# Patient Record
Sex: Female | Born: 1945 | Race: Black or African American | Hispanic: No | Marital: Single | Smoking: Never smoker
Health system: Southern US, Community
[De-identification: ages and names within clinical notes are randomized; demographics above are authoritative.]

## PROBLEM LIST (undated history)

## (undated) DIAGNOSIS — H40003 Preglaucoma, unspecified, bilateral: Secondary | ICD-10-CM

## (undated) DIAGNOSIS — E669 Obesity, unspecified: Secondary | ICD-10-CM

## (undated) DIAGNOSIS — Z6841 Body Mass Index (BMI) 40.0 and over, adult: Secondary | ICD-10-CM

## (undated) DIAGNOSIS — N189 Chronic kidney disease, unspecified: Secondary | ICD-10-CM

## (undated) DIAGNOSIS — I10 Essential (primary) hypertension: Secondary | ICD-10-CM

## (undated) DIAGNOSIS — M1990 Unspecified osteoarthritis, unspecified site: Secondary | ICD-10-CM

## (undated) DIAGNOSIS — I2510 Atherosclerotic heart disease of native coronary artery without angina pectoris: Secondary | ICD-10-CM

## (undated) DIAGNOSIS — K222 Esophageal obstruction: Secondary | ICD-10-CM

## (undated) DIAGNOSIS — Z9842 Cataract extraction status, left eye: Secondary | ICD-10-CM

## (undated) DIAGNOSIS — H409 Unspecified glaucoma: Secondary | ICD-10-CM

## (undated) DIAGNOSIS — D649 Anemia, unspecified: Secondary | ICD-10-CM

## (undated) DIAGNOSIS — E66812 Obesity, class 2: Secondary | ICD-10-CM

## (undated) DIAGNOSIS — F329 Major depressive disorder, single episode, unspecified: Secondary | ICD-10-CM

## (undated) DIAGNOSIS — E785 Hyperlipidemia, unspecified: Secondary | ICD-10-CM

## (undated) DIAGNOSIS — G894 Chronic pain syndrome: Secondary | ICD-10-CM

## (undated) DIAGNOSIS — Z9841 Cataract extraction status, right eye: Secondary | ICD-10-CM

## (undated) DIAGNOSIS — I739 Peripheral vascular disease, unspecified: Secondary | ICD-10-CM

## (undated) DIAGNOSIS — K219 Gastro-esophageal reflux disease without esophagitis: Secondary | ICD-10-CM

## (undated) DIAGNOSIS — H269 Unspecified cataract: Secondary | ICD-10-CM

## (undated) DIAGNOSIS — E1142 Type 2 diabetes mellitus with diabetic polyneuropathy: Secondary | ICD-10-CM

## (undated) DIAGNOSIS — K8020 Calculus of gallbladder without cholecystitis without obstruction: Secondary | ICD-10-CM

## (undated) DIAGNOSIS — D638 Anemia in other chronic diseases classified elsewhere: Secondary | ICD-10-CM

## (undated) DIAGNOSIS — Z5189 Encounter for other specified aftercare: Secondary | ICD-10-CM

## (undated) DIAGNOSIS — E119 Type 2 diabetes mellitus without complications: Principal | ICD-10-CM

## (undated) HISTORY — DX: Body Mass Index (BMI) 40.0 and over, adult: Z684

## (undated) HISTORY — DX: Unspecified glaucoma: H40.9

## (undated) HISTORY — DX: Esophageal obstruction: K22.2

## (undated) HISTORY — PX: ESOPHAGEAL DILATION: SHX303

## (undated) HISTORY — DX: Unspecified cataract: H26.9

## (undated) HISTORY — DX: Cataract extraction status, right eye: Z98.42

## (undated) HISTORY — DX: Morbid (severe) obesity due to excess calories: E66.01

## (undated) HISTORY — DX: Type 2 diabetes mellitus without complications: E11.9

## (undated) HISTORY — DX: Chronic pain syndrome: G89.4

## (undated) HISTORY — DX: Atherosclerotic heart disease of native coronary artery without angina pectoris: I25.10

## (undated) HISTORY — DX: Cataract extraction status, left eye: Z98.41

## (undated) HISTORY — DX: Encounter for other specified aftercare: Z51.89

## (undated) HISTORY — DX: Obesity, class 2: E66.812

## (undated) HISTORY — PX: CATARACT EXTRACTION, BILATERAL: SHX1313

## (undated) HISTORY — DX: Preglaucoma, unspecified, bilateral: H40.003

## (undated) HISTORY — DX: Calculus of gallbladder without cholecystitis without obstruction: K80.20

## (undated) HISTORY — DX: Peripheral vascular disease, unspecified: I73.9

## (undated) HISTORY — DX: Obesity, unspecified: E66.9

## (undated) HISTORY — DX: Chronic kidney disease, unspecified: N18.9

## (undated) HISTORY — PX: POLYPECTOMY: SHX149

## (undated) HISTORY — DX: Anemia, unspecified: D64.9

## (undated) HISTORY — DX: Hyperlipidemia, unspecified: E78.5

## (undated) HISTORY — PX: COLONOSCOPY: SHX174

## (undated) HISTORY — DX: Major depressive disorder, single episode, unspecified: F32.9

## (undated) HISTORY — PX: CHOLECYSTECTOMY: SHX55

## (undated) HISTORY — PX: LEFT HEART CATH: SHX5946

## (undated) HISTORY — DX: Essential (primary) hypertension: I10

## (undated) HISTORY — DX: Type 2 diabetes mellitus with diabetic polyneuropathy: E11.42

## (undated) HISTORY — DX: Anemia in other chronic diseases classified elsewhere: D63.8

## (undated) HISTORY — DX: Gastro-esophageal reflux disease without esophagitis: K21.9

## (undated) HISTORY — DX: Unspecified osteoarthritis, unspecified site: M19.90

---

## 1998-03-11 ENCOUNTER — Encounter: Admission: RE | Admit: 1998-03-11 | Discharge: 1998-03-11 | Payer: Self-pay | Admitting: Internal Medicine

## 1998-05-25 ENCOUNTER — Emergency Department (HOSPITAL_COMMUNITY): Admission: EM | Admit: 1998-05-25 | Discharge: 1998-05-25 | Payer: Self-pay | Admitting: Emergency Medicine

## 1998-07-09 ENCOUNTER — Encounter: Admission: RE | Admit: 1998-07-09 | Discharge: 1998-07-09 | Payer: Self-pay | Admitting: Hematology and Oncology

## 1998-09-17 ENCOUNTER — Ambulatory Visit (HOSPITAL_COMMUNITY): Admission: RE | Admit: 1998-09-17 | Discharge: 1998-09-17 | Payer: Self-pay | Admitting: Family Medicine

## 1998-10-02 ENCOUNTER — Encounter: Admission: RE | Admit: 1998-10-02 | Discharge: 1998-10-02 | Payer: Self-pay | Admitting: Hematology and Oncology

## 1998-10-31 ENCOUNTER — Encounter: Admission: RE | Admit: 1998-10-31 | Discharge: 1998-10-31 | Payer: Self-pay | Admitting: Internal Medicine

## 1998-10-31 ENCOUNTER — Emergency Department (HOSPITAL_COMMUNITY): Admission: EM | Admit: 1998-10-31 | Discharge: 1998-10-31 | Payer: Self-pay | Admitting: Emergency Medicine

## 1998-12-03 ENCOUNTER — Ambulatory Visit (HOSPITAL_BASED_OUTPATIENT_CLINIC_OR_DEPARTMENT_OTHER): Admission: RE | Admit: 1998-12-03 | Discharge: 1998-12-03 | Payer: Self-pay | Admitting: Orthopedic Surgery

## 1998-12-11 ENCOUNTER — Encounter: Admission: RE | Admit: 1998-12-11 | Discharge: 1999-02-20 | Payer: Self-pay | Admitting: Internal Medicine

## 1999-03-20 ENCOUNTER — Ambulatory Visit (HOSPITAL_COMMUNITY): Admission: RE | Admit: 1999-03-20 | Discharge: 1999-03-20 | Payer: Self-pay | Admitting: Family Medicine

## 1999-04-17 ENCOUNTER — Encounter: Admission: RE | Admit: 1999-04-17 | Discharge: 1999-04-17 | Payer: Self-pay | Admitting: Internal Medicine

## 1999-04-21 ENCOUNTER — Encounter: Admission: RE | Admit: 1999-04-21 | Discharge: 1999-04-21 | Payer: Self-pay | Admitting: Internal Medicine

## 1999-07-14 ENCOUNTER — Encounter: Admission: RE | Admit: 1999-07-14 | Discharge: 1999-07-14 | Payer: Self-pay | Admitting: Internal Medicine

## 1999-07-14 ENCOUNTER — Other Ambulatory Visit: Admission: RE | Admit: 1999-07-14 | Discharge: 1999-07-14 | Payer: Self-pay | Admitting: *Deleted

## 1999-08-05 ENCOUNTER — Encounter: Admission: RE | Admit: 1999-08-05 | Discharge: 1999-08-05 | Payer: Self-pay | Admitting: Hematology and Oncology

## 1999-09-08 ENCOUNTER — Encounter: Admission: RE | Admit: 1999-09-08 | Discharge: 1999-09-08 | Payer: Self-pay | Admitting: Internal Medicine

## 1999-09-08 ENCOUNTER — Other Ambulatory Visit: Admission: RE | Admit: 1999-09-08 | Discharge: 1999-09-30 | Payer: Self-pay | Admitting: *Deleted

## 1999-10-30 ENCOUNTER — Encounter: Admission: RE | Admit: 1999-10-30 | Discharge: 1999-10-30 | Payer: Self-pay | Admitting: Hematology and Oncology

## 1999-11-24 ENCOUNTER — Encounter: Admission: RE | Admit: 1999-11-24 | Discharge: 1999-11-24 | Payer: Self-pay | Admitting: Internal Medicine

## 2000-03-22 ENCOUNTER — Ambulatory Visit (HOSPITAL_COMMUNITY): Admission: RE | Admit: 2000-03-22 | Discharge: 2000-03-22 | Payer: Self-pay | Admitting: Family Medicine

## 2000-03-22 ENCOUNTER — Encounter: Payer: Self-pay | Admitting: Family Medicine

## 2000-03-26 ENCOUNTER — Encounter: Admission: RE | Admit: 2000-03-26 | Discharge: 2000-03-26 | Payer: Self-pay | Admitting: Family Medicine

## 2000-03-26 ENCOUNTER — Encounter: Payer: Self-pay | Admitting: Family Medicine

## 2000-04-15 ENCOUNTER — Encounter: Admission: RE | Admit: 2000-04-15 | Discharge: 2000-04-15 | Payer: Self-pay | Admitting: Hematology and Oncology

## 2000-08-09 ENCOUNTER — Encounter: Admission: RE | Admit: 2000-08-09 | Discharge: 2000-08-09 | Payer: Self-pay | Admitting: Internal Medicine

## 2000-08-09 ENCOUNTER — Ambulatory Visit (HOSPITAL_COMMUNITY): Admission: RE | Admit: 2000-08-09 | Discharge: 2000-08-09 | Payer: Self-pay

## 2000-09-27 ENCOUNTER — Encounter: Payer: Self-pay | Admitting: Family Medicine

## 2000-09-27 ENCOUNTER — Encounter: Admission: RE | Admit: 2000-09-27 | Discharge: 2000-09-27 | Payer: Self-pay | Admitting: Family Medicine

## 2000-11-29 ENCOUNTER — Encounter: Admission: RE | Admit: 2000-11-29 | Discharge: 2000-11-29 | Payer: Self-pay

## 2000-12-27 ENCOUNTER — Encounter: Admission: RE | Admit: 2000-12-27 | Discharge: 2000-12-27 | Payer: Self-pay | Admitting: Internal Medicine

## 2001-02-22 ENCOUNTER — Other Ambulatory Visit: Admission: RE | Admit: 2001-02-22 | Discharge: 2001-02-22 | Payer: Self-pay | Admitting: Obstetrics

## 2001-02-22 ENCOUNTER — Encounter: Admission: RE | Admit: 2001-02-22 | Discharge: 2001-02-22 | Payer: Self-pay | Admitting: Hematology and Oncology

## 2001-02-26 ENCOUNTER — Ambulatory Visit (HOSPITAL_COMMUNITY): Admission: RE | Admit: 2001-02-26 | Discharge: 2001-02-26 | Payer: Self-pay | Admitting: *Deleted

## 2001-03-29 ENCOUNTER — Ambulatory Visit (HOSPITAL_COMMUNITY): Admission: RE | Admit: 2001-03-29 | Discharge: 2001-03-29 | Payer: Self-pay | Admitting: Family Medicine

## 2001-03-29 ENCOUNTER — Encounter: Payer: Self-pay | Admitting: Family Medicine

## 2001-04-04 ENCOUNTER — Encounter: Admission: RE | Admit: 2001-04-04 | Discharge: 2001-04-04 | Payer: Self-pay | Admitting: Family Medicine

## 2001-04-04 ENCOUNTER — Encounter: Payer: Self-pay | Admitting: Family Medicine

## 2001-04-26 ENCOUNTER — Encounter: Admission: RE | Admit: 2001-04-26 | Discharge: 2001-04-26 | Payer: Self-pay | Admitting: Internal Medicine

## 2001-05-06 ENCOUNTER — Encounter (INDEPENDENT_AMBULATORY_CARE_PROVIDER_SITE_OTHER): Payer: Self-pay | Admitting: Specialist

## 2001-05-06 ENCOUNTER — Ambulatory Visit (HOSPITAL_BASED_OUTPATIENT_CLINIC_OR_DEPARTMENT_OTHER): Admission: RE | Admit: 2001-05-06 | Discharge: 2001-05-06 | Payer: Self-pay | Admitting: General Surgery

## 2001-06-22 ENCOUNTER — Ambulatory Visit (HOSPITAL_COMMUNITY): Admission: RE | Admit: 2001-06-22 | Discharge: 2001-06-22 | Payer: Self-pay | Admitting: *Deleted

## 2001-06-22 ENCOUNTER — Encounter: Admission: RE | Admit: 2001-06-22 | Discharge: 2001-06-22 | Payer: Self-pay | Admitting: *Deleted

## 2001-06-30 ENCOUNTER — Ambulatory Visit (HOSPITAL_COMMUNITY): Admission: RE | Admit: 2001-06-30 | Discharge: 2001-06-30 | Payer: Self-pay | Admitting: Infectious Diseases

## 2001-08-08 ENCOUNTER — Encounter: Admission: RE | Admit: 2001-08-08 | Discharge: 2001-08-08 | Payer: Self-pay

## 2001-09-06 ENCOUNTER — Encounter: Admission: RE | Admit: 2001-09-06 | Discharge: 2001-09-06 | Payer: Self-pay

## 2001-10-18 ENCOUNTER — Encounter: Admission: RE | Admit: 2001-10-18 | Discharge: 2001-10-18 | Payer: Self-pay

## 2002-02-21 ENCOUNTER — Encounter: Admission: RE | Admit: 2002-02-21 | Discharge: 2002-02-21 | Payer: Self-pay

## 2002-04-06 ENCOUNTER — Encounter: Admission: RE | Admit: 2002-04-06 | Discharge: 2002-04-06 | Payer: Self-pay | Admitting: Family Medicine

## 2002-04-06 ENCOUNTER — Encounter: Payer: Self-pay | Admitting: Family Medicine

## 2002-05-25 ENCOUNTER — Encounter: Admission: RE | Admit: 2002-05-25 | Discharge: 2002-05-25 | Payer: Self-pay | Admitting: Internal Medicine

## 2002-08-09 ENCOUNTER — Encounter: Admission: RE | Admit: 2002-08-09 | Discharge: 2002-08-09 | Payer: Self-pay | Admitting: Internal Medicine

## 2002-10-19 ENCOUNTER — Encounter: Admission: RE | Admit: 2002-10-19 | Discharge: 2002-10-19 | Payer: Self-pay | Admitting: Internal Medicine

## 2003-02-22 ENCOUNTER — Encounter: Admission: RE | Admit: 2003-02-22 | Discharge: 2003-02-22 | Payer: Self-pay | Admitting: Internal Medicine

## 2003-04-24 ENCOUNTER — Encounter: Payer: Self-pay | Admitting: Sports Medicine

## 2003-04-24 ENCOUNTER — Encounter: Admission: RE | Admit: 2003-04-24 | Discharge: 2003-04-24 | Payer: Self-pay | Admitting: Sports Medicine

## 2003-05-21 ENCOUNTER — Encounter: Admission: RE | Admit: 2003-05-21 | Discharge: 2003-05-21 | Payer: Self-pay | Admitting: Infectious Diseases

## 2003-05-24 ENCOUNTER — Encounter: Admission: RE | Admit: 2003-05-24 | Discharge: 2003-05-24 | Payer: Self-pay | Admitting: Internal Medicine

## 2003-07-13 ENCOUNTER — Encounter (INDEPENDENT_AMBULATORY_CARE_PROVIDER_SITE_OTHER): Payer: Self-pay

## 2003-07-13 ENCOUNTER — Other Ambulatory Visit: Admission: RE | Admit: 2003-07-13 | Discharge: 2003-07-13 | Payer: Self-pay | Admitting: *Deleted

## 2003-07-13 ENCOUNTER — Encounter: Admission: RE | Admit: 2003-07-13 | Discharge: 2003-07-13 | Payer: Self-pay | Admitting: Family Medicine

## 2003-07-13 ENCOUNTER — Encounter (INDEPENDENT_AMBULATORY_CARE_PROVIDER_SITE_OTHER): Payer: Self-pay | Admitting: Specialist

## 2003-07-23 ENCOUNTER — Encounter: Admission: RE | Admit: 2003-07-23 | Discharge: 2003-07-23 | Payer: Self-pay | Admitting: Internal Medicine

## 2003-07-25 ENCOUNTER — Encounter: Admission: RE | Admit: 2003-07-25 | Discharge: 2003-07-25 | Payer: Self-pay | Admitting: Internal Medicine

## 2003-07-25 ENCOUNTER — Encounter: Payer: Self-pay | Admitting: Internal Medicine

## 2003-07-25 ENCOUNTER — Ambulatory Visit (HOSPITAL_COMMUNITY): Admission: RE | Admit: 2003-07-25 | Discharge: 2003-07-25 | Payer: Self-pay | Admitting: Internal Medicine

## 2003-08-30 ENCOUNTER — Encounter: Admission: RE | Admit: 2003-08-30 | Discharge: 2003-08-30 | Payer: Self-pay | Admitting: Internal Medicine

## 2003-09-10 ENCOUNTER — Ambulatory Visit (HOSPITAL_COMMUNITY): Admission: RE | Admit: 2003-09-10 | Discharge: 2003-09-10 | Payer: Self-pay | Admitting: Internal Medicine

## 2003-11-01 ENCOUNTER — Encounter: Admission: RE | Admit: 2003-11-01 | Discharge: 2004-01-30 | Payer: Self-pay | Admitting: Orthopedic Surgery

## 2003-12-14 ENCOUNTER — Encounter: Admission: RE | Admit: 2003-12-14 | Discharge: 2003-12-14 | Payer: Self-pay | Admitting: Internal Medicine

## 2003-12-18 ENCOUNTER — Encounter: Admission: RE | Admit: 2003-12-18 | Discharge: 2003-12-18 | Payer: Self-pay | Admitting: Internal Medicine

## 2004-01-31 ENCOUNTER — Encounter: Admission: RE | Admit: 2004-01-31 | Discharge: 2004-03-18 | Payer: Self-pay | Admitting: Orthopedic Surgery

## 2004-04-23 ENCOUNTER — Encounter: Admission: RE | Admit: 2004-04-23 | Discharge: 2004-04-23 | Payer: Self-pay | Admitting: Internal Medicine

## 2004-04-28 ENCOUNTER — Ambulatory Visit (HOSPITAL_COMMUNITY): Admission: RE | Admit: 2004-04-28 | Discharge: 2004-04-28 | Payer: Self-pay | Admitting: Internal Medicine

## 2004-05-01 ENCOUNTER — Encounter: Admission: RE | Admit: 2004-05-01 | Discharge: 2004-05-01 | Payer: Self-pay | Admitting: Internal Medicine

## 2004-05-07 ENCOUNTER — Ambulatory Visit (HOSPITAL_COMMUNITY): Admission: RE | Admit: 2004-05-07 | Discharge: 2004-05-08 | Payer: Self-pay | Admitting: Cardiology

## 2004-08-26 ENCOUNTER — Ambulatory Visit: Payer: Self-pay | Admitting: Internal Medicine

## 2004-08-27 ENCOUNTER — Ambulatory Visit: Payer: Self-pay | Admitting: Cardiology

## 2004-08-29 ENCOUNTER — Ambulatory Visit (HOSPITAL_COMMUNITY): Admission: RE | Admit: 2004-08-29 | Discharge: 2004-08-29 | Payer: Self-pay | Admitting: Internal Medicine

## 2004-09-01 ENCOUNTER — Ambulatory Visit: Payer: Self-pay | Admitting: Internal Medicine

## 2004-10-14 ENCOUNTER — Ambulatory Visit: Payer: Self-pay | Admitting: Internal Medicine

## 2005-01-14 ENCOUNTER — Other Ambulatory Visit: Admission: RE | Admit: 2005-01-14 | Discharge: 2005-01-14 | Payer: Self-pay | Admitting: Internal Medicine

## 2005-01-14 ENCOUNTER — Encounter (INDEPENDENT_AMBULATORY_CARE_PROVIDER_SITE_OTHER): Payer: Self-pay | Admitting: Internal Medicine

## 2005-01-14 ENCOUNTER — Encounter (INDEPENDENT_AMBULATORY_CARE_PROVIDER_SITE_OTHER): Payer: Self-pay | Admitting: *Deleted

## 2005-01-14 ENCOUNTER — Ambulatory Visit: Payer: Self-pay | Admitting: Internal Medicine

## 2005-02-16 ENCOUNTER — Ambulatory Visit: Payer: Self-pay | Admitting: Cardiology

## 2005-03-19 ENCOUNTER — Observation Stay (HOSPITAL_COMMUNITY): Admission: RE | Admit: 2005-03-19 | Discharge: 2005-03-20 | Payer: Self-pay | Admitting: Neurological Surgery

## 2005-05-08 ENCOUNTER — Encounter: Admission: RE | Admit: 2005-05-08 | Discharge: 2005-05-08 | Payer: Self-pay | Admitting: Internal Medicine

## 2005-05-14 ENCOUNTER — Encounter: Admission: RE | Admit: 2005-05-14 | Discharge: 2005-08-12 | Payer: Self-pay | Admitting: Neurological Surgery

## 2005-06-23 ENCOUNTER — Encounter: Admission: RE | Admit: 2005-06-23 | Discharge: 2005-06-23 | Payer: Self-pay | Admitting: Neurological Surgery

## 2005-07-16 ENCOUNTER — Ambulatory Visit: Payer: Self-pay | Admitting: Hospitalist

## 2005-08-13 ENCOUNTER — Ambulatory Visit (HOSPITAL_BASED_OUTPATIENT_CLINIC_OR_DEPARTMENT_OTHER): Admission: RE | Admit: 2005-08-13 | Discharge: 2005-08-13 | Payer: Self-pay | Admitting: Internal Medicine

## 2005-08-14 ENCOUNTER — Encounter: Admission: RE | Admit: 2005-08-14 | Discharge: 2005-08-14 | Payer: Self-pay | Admitting: Neurological Surgery

## 2005-08-16 ENCOUNTER — Ambulatory Visit: Payer: Self-pay | Admitting: Internal Medicine

## 2005-09-01 ENCOUNTER — Ambulatory Visit: Payer: Self-pay | Admitting: Cardiology

## 2005-10-14 ENCOUNTER — Inpatient Hospital Stay (HOSPITAL_COMMUNITY): Admission: RE | Admit: 2005-10-14 | Discharge: 2005-10-19 | Payer: Self-pay | Admitting: Neurological Surgery

## 2005-11-16 ENCOUNTER — Encounter: Admission: RE | Admit: 2005-11-16 | Discharge: 2005-12-09 | Payer: Self-pay | Admitting: Neurological Surgery

## 2006-01-05 ENCOUNTER — Encounter: Admission: RE | Admit: 2006-01-05 | Discharge: 2006-01-05 | Payer: Self-pay | Admitting: Anesthesiology

## 2006-01-13 ENCOUNTER — Ambulatory Visit: Payer: Self-pay | Admitting: Internal Medicine

## 2006-01-28 ENCOUNTER — Ambulatory Visit (HOSPITAL_COMMUNITY): Admission: RE | Admit: 2006-01-28 | Discharge: 2006-01-28 | Payer: Self-pay | Admitting: Internal Medicine

## 2006-02-03 ENCOUNTER — Encounter: Payer: Self-pay | Admitting: Cardiology

## 2006-02-03 ENCOUNTER — Ambulatory Visit (HOSPITAL_COMMUNITY): Admission: RE | Admit: 2006-02-03 | Discharge: 2006-02-03 | Payer: Self-pay | Admitting: Internal Medicine

## 2006-02-03 ENCOUNTER — Ambulatory Visit: Payer: Self-pay | Admitting: Cardiology

## 2006-02-09 ENCOUNTER — Ambulatory Visit: Payer: Self-pay | Admitting: Cardiology

## 2006-02-15 ENCOUNTER — Ambulatory Visit: Payer: Self-pay | Admitting: Internal Medicine

## 2006-04-02 ENCOUNTER — Encounter: Admission: RE | Admit: 2006-04-02 | Discharge: 2006-04-02 | Payer: Self-pay | Admitting: Neurological Surgery

## 2006-04-16 ENCOUNTER — Ambulatory Visit: Payer: Self-pay | Admitting: Internal Medicine

## 2006-05-11 ENCOUNTER — Encounter: Admission: RE | Admit: 2006-05-11 | Discharge: 2006-05-11 | Payer: Self-pay | Admitting: Internal Medicine

## 2006-06-08 ENCOUNTER — Ambulatory Visit: Payer: Self-pay | Admitting: Hospitalist

## 2006-07-29 ENCOUNTER — Ambulatory Visit: Payer: Self-pay | Admitting: Internal Medicine

## 2006-08-26 DIAGNOSIS — I2510 Atherosclerotic heart disease of native coronary artery without angina pectoris: Secondary | ICD-10-CM

## 2006-08-26 DIAGNOSIS — E1165 Type 2 diabetes mellitus with hyperglycemia: Secondary | ICD-10-CM | POA: Insufficient documentation

## 2006-08-26 DIAGNOSIS — E119 Type 2 diabetes mellitus without complications: Secondary | ICD-10-CM

## 2006-08-26 DIAGNOSIS — E118 Type 2 diabetes mellitus with unspecified complications: Secondary | ICD-10-CM

## 2006-08-26 DIAGNOSIS — E669 Obesity, unspecified: Secondary | ICD-10-CM | POA: Insufficient documentation

## 2006-08-26 DIAGNOSIS — IMO0002 Reserved for concepts with insufficient information to code with codable children: Secondary | ICD-10-CM | POA: Insufficient documentation

## 2006-08-26 DIAGNOSIS — E1151 Type 2 diabetes mellitus with diabetic peripheral angiopathy without gangrene: Secondary | ICD-10-CM | POA: Insufficient documentation

## 2006-08-26 DIAGNOSIS — F32A Depression, unspecified: Secondary | ICD-10-CM | POA: Insufficient documentation

## 2006-08-26 DIAGNOSIS — K219 Gastro-esophageal reflux disease without esophagitis: Secondary | ICD-10-CM | POA: Insufficient documentation

## 2006-08-26 DIAGNOSIS — I10 Essential (primary) hypertension: Secondary | ICD-10-CM

## 2006-08-26 DIAGNOSIS — I152 Hypertension secondary to endocrine disorders: Secondary | ICD-10-CM | POA: Insufficient documentation

## 2006-08-26 DIAGNOSIS — E1159 Type 2 diabetes mellitus with other circulatory complications: Secondary | ICD-10-CM | POA: Insufficient documentation

## 2006-08-26 DIAGNOSIS — F3289 Other specified depressive episodes: Secondary | ICD-10-CM

## 2006-08-26 DIAGNOSIS — M1990 Unspecified osteoarthritis, unspecified site: Secondary | ICD-10-CM | POA: Insufficient documentation

## 2006-08-26 DIAGNOSIS — F329 Major depressive disorder, single episode, unspecified: Secondary | ICD-10-CM

## 2006-08-26 DIAGNOSIS — E11319 Type 2 diabetes mellitus with unspecified diabetic retinopathy without macular edema: Secondary | ICD-10-CM | POA: Insufficient documentation

## 2006-08-26 HISTORY — DX: Essential (primary) hypertension: I10

## 2006-08-26 HISTORY — DX: Atherosclerotic heart disease of native coronary artery without angina pectoris: I25.10

## 2006-08-26 HISTORY — DX: Type 2 diabetes mellitus with unspecified complications: E11.8

## 2006-08-26 HISTORY — DX: Other specified depressive episodes: F32.89

## 2006-08-26 HISTORY — DX: Type 2 diabetes mellitus without complications: E11.9

## 2006-08-26 HISTORY — DX: Major depressive disorder, single episode, unspecified: F32.9

## 2006-08-26 HISTORY — DX: Gastro-esophageal reflux disease without esophagitis: K21.9

## 2006-08-26 HISTORY — DX: Obesity, unspecified: E66.9

## 2006-10-18 ENCOUNTER — Encounter: Admission: RE | Admit: 2006-10-18 | Discharge: 2006-10-18 | Payer: Self-pay | Admitting: Neurological Surgery

## 2006-10-20 ENCOUNTER — Encounter: Admission: RE | Admit: 2006-10-20 | Discharge: 2006-10-20 | Payer: Self-pay | Admitting: Neurological Surgery

## 2006-11-01 DIAGNOSIS — E785 Hyperlipidemia, unspecified: Secondary | ICD-10-CM

## 2006-11-01 DIAGNOSIS — H409 Unspecified glaucoma: Secondary | ICD-10-CM | POA: Insufficient documentation

## 2006-11-01 DIAGNOSIS — D509 Iron deficiency anemia, unspecified: Secondary | ICD-10-CM | POA: Insufficient documentation

## 2006-11-01 DIAGNOSIS — E1169 Type 2 diabetes mellitus with other specified complication: Secondary | ICD-10-CM | POA: Insufficient documentation

## 2006-11-01 HISTORY — DX: Hyperlipidemia, unspecified: E78.5

## 2006-11-02 ENCOUNTER — Telehealth (INDEPENDENT_AMBULATORY_CARE_PROVIDER_SITE_OTHER): Payer: Self-pay | Admitting: *Deleted

## 2006-12-07 ENCOUNTER — Ambulatory Visit: Payer: Self-pay | Admitting: *Deleted

## 2006-12-07 ENCOUNTER — Encounter (INDEPENDENT_AMBULATORY_CARE_PROVIDER_SITE_OTHER): Payer: Self-pay | Admitting: Internal Medicine

## 2006-12-07 LAB — CONVERTED CEMR LAB
BUN: 19 mg/dL (ref 6–23)
CO2: 26 meq/L (ref 19–32)
Calcium: 9.1 mg/dL (ref 8.4–10.5)
Chloride: 105 meq/L (ref 96–112)
Creatinine, Ser: 0.63 mg/dL (ref 0.40–1.20)
Glucose, Bld: 121 mg/dL
Glucose, Bld: 124 mg/dL — ABNORMAL HIGH (ref 70–99)
Hgb A1c MFr Bld: 7 %
Potassium: 4.6 meq/L (ref 3.5–5.3)
Sodium: 141 meq/L (ref 135–145)

## 2006-12-09 ENCOUNTER — Ambulatory Visit (HOSPITAL_COMMUNITY): Admission: RE | Admit: 2006-12-09 | Discharge: 2006-12-09 | Payer: Self-pay | Admitting: *Deleted

## 2006-12-09 ENCOUNTER — Encounter (INDEPENDENT_AMBULATORY_CARE_PROVIDER_SITE_OTHER): Payer: Self-pay | Admitting: Unknown Physician Specialty

## 2006-12-09 ENCOUNTER — Ambulatory Visit: Payer: Self-pay | Admitting: Vascular Surgery

## 2006-12-13 ENCOUNTER — Ambulatory Visit: Payer: Self-pay | Admitting: *Deleted

## 2006-12-13 ENCOUNTER — Encounter (INDEPENDENT_AMBULATORY_CARE_PROVIDER_SITE_OTHER): Payer: Self-pay | Admitting: Unknown Physician Specialty

## 2006-12-13 LAB — CONVERTED CEMR LAB
HCT: 33.7 % — ABNORMAL LOW (ref 36.0–46.0)
Hemoglobin: 11.6 g/dL — ABNORMAL LOW (ref 12.0–15.0)
MCHC: 34.4 g/dL (ref 30.0–36.0)
MCV: 84.7 fL (ref 78.0–100.0)
Platelets: 317 10*3/uL (ref 150–400)
RBC: 3.98 M/uL (ref 3.87–5.11)
RDW: 16 % — ABNORMAL HIGH (ref 11.5–14.0)
WBC: 6 10*3/uL (ref 4.0–10.5)

## 2007-01-10 ENCOUNTER — Ambulatory Visit: Payer: Self-pay | Admitting: *Deleted

## 2007-01-10 LAB — CONVERTED CEMR LAB: Blood Glucose, Fingerstick: 150

## 2007-01-21 ENCOUNTER — Encounter (INDEPENDENT_AMBULATORY_CARE_PROVIDER_SITE_OTHER): Payer: Self-pay | Admitting: Unknown Physician Specialty

## 2007-01-24 ENCOUNTER — Encounter (INDEPENDENT_AMBULATORY_CARE_PROVIDER_SITE_OTHER): Payer: Self-pay | Admitting: Unknown Physician Specialty

## 2007-01-25 ENCOUNTER — Telehealth: Payer: Self-pay | Admitting: *Deleted

## 2007-01-27 ENCOUNTER — Telehealth: Payer: Self-pay | Admitting: *Deleted

## 2007-01-27 ENCOUNTER — Encounter (INDEPENDENT_AMBULATORY_CARE_PROVIDER_SITE_OTHER): Payer: Self-pay | Admitting: *Deleted

## 2007-01-27 ENCOUNTER — Encounter (HOSPITAL_COMMUNITY): Admission: RE | Admit: 2007-01-27 | Discharge: 2007-04-27 | Payer: Self-pay | Admitting: Nephrology

## 2007-03-17 ENCOUNTER — Ambulatory Visit: Payer: Self-pay | Admitting: Internal Medicine

## 2007-03-17 ENCOUNTER — Observation Stay (HOSPITAL_COMMUNITY): Admission: AD | Admit: 2007-03-17 | Discharge: 2007-03-18 | Payer: Self-pay | Admitting: Internal Medicine

## 2007-03-17 ENCOUNTER — Telehealth: Payer: Self-pay | Admitting: *Deleted

## 2007-03-17 ENCOUNTER — Ambulatory Visit: Payer: Self-pay | Admitting: Cardiology

## 2007-03-17 LAB — CONVERTED CEMR LAB
Blood Glucose, Fingerstick: 114
Hgb A1c MFr Bld: 7.1 %

## 2007-03-18 ENCOUNTER — Encounter: Payer: Self-pay | Admitting: Internal Medicine

## 2007-03-18 ENCOUNTER — Encounter (INDEPENDENT_AMBULATORY_CARE_PROVIDER_SITE_OTHER): Payer: Self-pay | Admitting: *Deleted

## 2007-04-07 ENCOUNTER — Ambulatory Visit: Payer: Self-pay | Admitting: Internal Medicine

## 2007-04-07 ENCOUNTER — Telehealth: Payer: Self-pay | Admitting: *Deleted

## 2007-04-07 ENCOUNTER — Encounter (INDEPENDENT_AMBULATORY_CARE_PROVIDER_SITE_OTHER): Payer: Self-pay | Admitting: Internal Medicine

## 2007-04-07 LAB — CONVERTED CEMR LAB
BUN: 19 mg/dL (ref 6–23)
CO2: 27 meq/L (ref 19–32)
Calcium: 8.3 mg/dL — ABNORMAL LOW (ref 8.4–10.5)
Chloride: 104 meq/L (ref 96–112)
Creatinine, Ser: 0.63 mg/dL (ref 0.40–1.20)
Glucose, Bld: 124 mg/dL — ABNORMAL HIGH (ref 70–99)
Magnesium: 1.3 mg/dL — ABNORMAL LOW (ref 1.5–2.5)
Potassium: 4.2 meq/L (ref 3.5–5.3)
Sodium: 139 meq/L (ref 135–145)

## 2007-04-11 ENCOUNTER — Telehealth: Payer: Self-pay | Admitting: *Deleted

## 2007-04-19 ENCOUNTER — Telehealth: Payer: Self-pay | Admitting: *Deleted

## 2007-04-26 ENCOUNTER — Telehealth (INDEPENDENT_AMBULATORY_CARE_PROVIDER_SITE_OTHER): Payer: Self-pay | Admitting: *Deleted

## 2007-05-03 ENCOUNTER — Encounter (INDEPENDENT_AMBULATORY_CARE_PROVIDER_SITE_OTHER): Payer: Self-pay | Admitting: *Deleted

## 2007-05-03 ENCOUNTER — Ambulatory Visit: Payer: Self-pay | Admitting: Internal Medicine

## 2007-05-03 LAB — CONVERTED CEMR LAB
ALT: 12 units/L (ref 0–35)
AST: 13 units/L (ref 0–37)
Albumin: 4.2 g/dL (ref 3.5–5.2)
Alkaline Phosphatase: 70 units/L (ref 39–117)
BUN: 25 mg/dL — ABNORMAL HIGH (ref 6–23)
Blood Glucose, Fingerstick: 82
CO2: 27 meq/L (ref 19–32)
Calcium: 9 mg/dL (ref 8.4–10.5)
Chloride: 99 meq/L (ref 96–112)
Creatinine, Ser: 0.79 mg/dL (ref 0.40–1.20)
Creatinine, Urine: 35.5 mg/dL
Glucose, Bld: 104 mg/dL — ABNORMAL HIGH (ref 70–99)
Microalb Creat Ratio: 5.6 mg/g (ref 0.0–30.0)
Microalb, Ur: 0.2 mg/dL (ref 0.00–1.89)
Potassium: 3.8 meq/L (ref 3.5–5.3)
Sodium: 138 meq/L (ref 135–145)
Total Bilirubin: 0.5 mg/dL (ref 0.3–1.2)
Total Protein: 7.2 g/dL (ref 6.0–8.3)

## 2007-05-04 ENCOUNTER — Encounter (INDEPENDENT_AMBULATORY_CARE_PROVIDER_SITE_OTHER): Payer: Self-pay | Admitting: *Deleted

## 2007-05-13 ENCOUNTER — Encounter (HOSPITAL_COMMUNITY): Admission: RE | Admit: 2007-05-13 | Discharge: 2007-08-11 | Payer: Self-pay | Admitting: Nephrology

## 2007-05-18 ENCOUNTER — Encounter: Admission: RE | Admit: 2007-05-18 | Discharge: 2007-05-18 | Payer: Self-pay | Admitting: Neurological Surgery

## 2007-05-23 ENCOUNTER — Ambulatory Visit: Payer: Self-pay | Admitting: Cardiology

## 2007-05-25 ENCOUNTER — Ambulatory Visit: Payer: Self-pay

## 2007-05-26 ENCOUNTER — Telehealth: Payer: Self-pay | Admitting: *Deleted

## 2007-06-01 ENCOUNTER — Encounter: Admission: RE | Admit: 2007-06-01 | Discharge: 2007-06-01 | Payer: Self-pay | Admitting: Internal Medicine

## 2007-06-06 ENCOUNTER — Ambulatory Visit: Payer: Self-pay | Admitting: Cardiology

## 2007-06-07 ENCOUNTER — Telehealth: Payer: Self-pay | Admitting: Infectious Disease

## 2007-06-14 ENCOUNTER — Telehealth: Payer: Self-pay | Admitting: Internal Medicine

## 2007-06-14 ENCOUNTER — Ambulatory Visit: Payer: Self-pay | Admitting: Cardiology

## 2007-06-14 LAB — CONVERTED CEMR LAB
BUN: 20 mg/dL (ref 6–23)
CO2: 32 meq/L (ref 19–32)
Calcium: 8.7 mg/dL (ref 8.4–10.5)
Chloride: 104 meq/L (ref 96–112)
Creatinine, Ser: 0.7 mg/dL (ref 0.4–1.2)
GFR calc Af Amer: 109 mL/min
GFR calc non Af Amer: 90 mL/min
Glucose, Bld: 150 mg/dL — ABNORMAL HIGH (ref 70–99)
Potassium: 3.8 meq/L (ref 3.5–5.1)
Sodium: 143 meq/L (ref 135–145)

## 2007-06-28 ENCOUNTER — Encounter (INDEPENDENT_AMBULATORY_CARE_PROVIDER_SITE_OTHER): Payer: Self-pay | Admitting: *Deleted

## 2007-07-11 ENCOUNTER — Other Ambulatory Visit: Payer: Self-pay | Admitting: Neurological Surgery

## 2007-07-13 HISTORY — PX: OTHER SURGICAL HISTORY: SHX169

## 2007-07-18 ENCOUNTER — Inpatient Hospital Stay (HOSPITAL_COMMUNITY): Admission: RE | Admit: 2007-07-18 | Discharge: 2007-07-23 | Payer: Self-pay | Admitting: Neurological Surgery

## 2007-07-18 HISTORY — PX: OTHER SURGICAL HISTORY: SHX169

## 2007-08-08 ENCOUNTER — Ambulatory Visit: Payer: Self-pay | Admitting: Cardiology

## 2007-08-15 ENCOUNTER — Encounter: Admission: RE | Admit: 2007-08-15 | Discharge: 2007-08-15 | Payer: Self-pay | Admitting: Neurological Surgery

## 2007-08-15 ENCOUNTER — Telehealth (INDEPENDENT_AMBULATORY_CARE_PROVIDER_SITE_OTHER): Payer: Self-pay | Admitting: *Deleted

## 2007-08-16 ENCOUNTER — Telehealth: Payer: Self-pay | Admitting: *Deleted

## 2007-08-22 ENCOUNTER — Telehealth: Payer: Self-pay | Admitting: *Deleted

## 2007-08-26 ENCOUNTER — Telehealth: Payer: Self-pay | Admitting: *Deleted

## 2007-09-30 ENCOUNTER — Telehealth (INDEPENDENT_AMBULATORY_CARE_PROVIDER_SITE_OTHER): Payer: Self-pay | Admitting: *Deleted

## 2007-10-17 ENCOUNTER — Encounter: Admission: RE | Admit: 2007-10-17 | Discharge: 2007-10-17 | Payer: Self-pay | Admitting: Neurological Surgery

## 2007-10-19 ENCOUNTER — Encounter (INDEPENDENT_AMBULATORY_CARE_PROVIDER_SITE_OTHER): Payer: Self-pay | Admitting: *Deleted

## 2007-10-19 ENCOUNTER — Ambulatory Visit: Payer: Self-pay | Admitting: Internal Medicine

## 2007-10-19 LAB — CONVERTED CEMR LAB
Blood Glucose, Fingerstick: 157
Hgb A1c MFr Bld: 7.3 %

## 2007-10-20 LAB — CONVERTED CEMR LAB
ALT: 11 units/L (ref 0–35)
AST: 12 units/L (ref 0–37)
Albumin: 4 g/dL (ref 3.5–5.2)
Alkaline Phosphatase: 100 units/L (ref 39–117)
BUN: 24 mg/dL — ABNORMAL HIGH (ref 6–23)
Basophils Absolute: 0 10*3/uL (ref 0.0–0.1)
Basophils Relative: 0 % (ref 0–1)
CO2: 30 meq/L (ref 19–32)
Calcium: 9.1 mg/dL (ref 8.4–10.5)
Chloride: 101 meq/L (ref 96–112)
Creatinine, Ser: 0.79 mg/dL (ref 0.40–1.20)
Eosinophils Absolute: 0 10*3/uL (ref 0.0–0.7)
Eosinophils Relative: 1 % (ref 0–5)
Glucose, Bld: 133 mg/dL — ABNORMAL HIGH (ref 70–99)
HCT: 32.8 % — ABNORMAL LOW (ref 36.0–46.0)
Hemoglobin: 10.9 g/dL — ABNORMAL LOW (ref 12.0–15.0)
Lymphocytes Relative: 46 % (ref 12–46)
Lymphs Abs: 3 10*3/uL (ref 0.7–4.0)
MCHC: 33.2 g/dL (ref 30.0–36.0)
MCV: 83.9 fL (ref 78.0–100.0)
Magnesium: 1.4 mg/dL — ABNORMAL LOW (ref 1.5–2.5)
Monocytes Absolute: 0.3 10*3/uL (ref 0.1–1.0)
Monocytes Relative: 5 % (ref 3–12)
Neutro Abs: 3.1 10*3/uL (ref 1.7–7.7)
Neutrophils Relative %: 48 % (ref 43–77)
Platelets: 304 10*3/uL (ref 150–400)
Potassium: 5.5 meq/L — ABNORMAL HIGH (ref 3.5–5.3)
RBC: 3.91 M/uL (ref 3.87–5.11)
RDW: 15.8 % — ABNORMAL HIGH (ref 11.5–15.5)
Sodium: 140 meq/L (ref 135–145)
Total Bilirubin: 0.3 mg/dL (ref 0.3–1.2)
Total Protein: 7.1 g/dL (ref 6.0–8.3)
WBC: 6.4 10*3/uL (ref 4.0–10.5)

## 2007-10-21 ENCOUNTER — Ambulatory Visit: Payer: Self-pay | Admitting: Infectious Disease

## 2007-10-21 ENCOUNTER — Encounter (INDEPENDENT_AMBULATORY_CARE_PROVIDER_SITE_OTHER): Payer: Self-pay | Admitting: *Deleted

## 2007-10-24 LAB — CONVERTED CEMR LAB
BUN: 15 mg/dL (ref 6–23)
CO2: 32 meq/L (ref 19–32)
Calcium: 9.3 mg/dL (ref 8.4–10.5)
Chloride: 100 meq/L (ref 96–112)
Creatinine, Ser: 0.6 mg/dL (ref 0.40–1.20)
Glucose, Bld: 166 mg/dL — ABNORMAL HIGH (ref 70–99)
Potassium: 4.9 meq/L (ref 3.5–5.3)
Sodium: 139 meq/L (ref 135–145)

## 2007-11-07 ENCOUNTER — Ambulatory Visit: Payer: Self-pay | Admitting: Cardiology

## 2007-11-10 ENCOUNTER — Encounter (INDEPENDENT_AMBULATORY_CARE_PROVIDER_SITE_OTHER): Payer: Self-pay | Admitting: *Deleted

## 2007-11-10 ENCOUNTER — Ambulatory Visit: Payer: Self-pay | Admitting: Infectious Disease

## 2007-11-10 LAB — CONVERTED CEMR LAB: Blood Glucose, Fingerstick: 192

## 2007-11-11 ENCOUNTER — Encounter (INDEPENDENT_AMBULATORY_CARE_PROVIDER_SITE_OTHER): Payer: Self-pay | Admitting: *Deleted

## 2007-11-11 LAB — CONVERTED CEMR LAB
BUN: 19 mg/dL (ref 6–23)
CO2: 27 meq/L (ref 19–32)
Calcium: 9.1 mg/dL (ref 8.4–10.5)
Chloride: 102 meq/L (ref 96–112)
Creatinine, Ser: 0.76 mg/dL (ref 0.40–1.20)
Glucose, Bld: 200 mg/dL — ABNORMAL HIGH (ref 70–99)
Magnesium: 1.3 mg/dL — ABNORMAL LOW (ref 1.5–2.5)
Potassium: 4.2 meq/L (ref 3.5–5.3)
Sodium: 140 meq/L (ref 135–145)

## 2007-12-30 ENCOUNTER — Encounter (INDEPENDENT_AMBULATORY_CARE_PROVIDER_SITE_OTHER): Payer: Self-pay | Admitting: *Deleted

## 2008-01-16 ENCOUNTER — Encounter: Admission: RE | Admit: 2008-01-16 | Discharge: 2008-01-16 | Payer: Self-pay | Admitting: Neurological Surgery

## 2008-02-08 ENCOUNTER — Encounter (INDEPENDENT_AMBULATORY_CARE_PROVIDER_SITE_OTHER): Payer: Self-pay | Admitting: *Deleted

## 2008-02-08 ENCOUNTER — Ambulatory Visit: Payer: Self-pay | Admitting: Internal Medicine

## 2008-02-08 LAB — CONVERTED CEMR LAB
BUN: 12 mg/dL (ref 6–23)
Blood Glucose, Fingerstick: 118
CO2: 33 meq/L — ABNORMAL HIGH (ref 19–32)
Calcium: 8.1 mg/dL — ABNORMAL LOW (ref 8.4–10.5)
Chloride: 95 meq/L — ABNORMAL LOW (ref 96–112)
Creatinine, Ser: 0.71 mg/dL (ref 0.40–1.20)
Glucose, Bld: 100 mg/dL — ABNORMAL HIGH (ref 70–99)
Hgb A1c MFr Bld: 6.7 %
Potassium: 2.9 meq/L — ABNORMAL LOW (ref 3.5–5.3)
Pro B Natriuretic peptide (BNP): 67 pg/mL (ref 0.0–100.0)
Sodium: 140 meq/L (ref 135–145)

## 2008-02-09 ENCOUNTER — Ambulatory Visit (HOSPITAL_COMMUNITY): Admission: RE | Admit: 2008-02-09 | Discharge: 2008-02-09 | Payer: Self-pay | Admitting: Infectious Disease

## 2008-02-09 ENCOUNTER — Encounter: Payer: Self-pay | Admitting: *Deleted

## 2008-02-10 ENCOUNTER — Encounter (INDEPENDENT_AMBULATORY_CARE_PROVIDER_SITE_OTHER): Payer: Self-pay | Admitting: *Deleted

## 2008-02-10 ENCOUNTER — Ambulatory Visit: Payer: Self-pay | Admitting: Hospitalist

## 2008-02-10 LAB — CONVERTED CEMR LAB
BUN: 10 mg/dL (ref 6–23)
CO2: 30 meq/L (ref 19–32)
Calcium: 8.2 mg/dL — ABNORMAL LOW (ref 8.4–10.5)
Chloride: 103 meq/L (ref 96–112)
Creatinine, Ser: 0.83 mg/dL (ref 0.40–1.20)
Glucose, Bld: 161 mg/dL — ABNORMAL HIGH (ref 70–99)
Potassium: 3.5 meq/L (ref 3.5–5.3)
Sodium: 143 meq/L (ref 135–145)

## 2008-02-22 ENCOUNTER — Encounter (INDEPENDENT_AMBULATORY_CARE_PROVIDER_SITE_OTHER): Payer: Self-pay | Admitting: *Deleted

## 2008-05-03 ENCOUNTER — Encounter (INDEPENDENT_AMBULATORY_CARE_PROVIDER_SITE_OTHER): Payer: Self-pay | Admitting: Internal Medicine

## 2008-05-03 ENCOUNTER — Ambulatory Visit: Payer: Self-pay | Admitting: Internal Medicine

## 2008-05-03 LAB — CONVERTED CEMR LAB
ALT: 9 units/L (ref 0–35)
AST: 12 units/L (ref 0–37)
Albumin: 4.4 g/dL (ref 3.5–5.2)
Alkaline Phosphatase: 76 units/L (ref 39–117)
BUN: 14 mg/dL (ref 6–23)
Blood Glucose, Fingerstick: 175
CO2: 28 meq/L (ref 19–32)
Calcium: 8.6 mg/dL (ref 8.4–10.5)
Chloride: 102 meq/L (ref 96–112)
Cholesterol: 127 mg/dL (ref 0–200)
Creatinine, Ser: 0.62 mg/dL (ref 0.40–1.20)
Creatinine, Urine: 130.5 mg/dL
Glucose, Bld: 109 mg/dL — ABNORMAL HIGH (ref 70–99)
HDL: 57 mg/dL (ref >39)
Hgb A1c MFr Bld: 6.7 %
LDL Cholesterol: 57 mg/dL (ref 0–99)
Microalb Creat Ratio: 5.5 mg/g (ref 0.0–30.0)
Microalb, Ur: 0.72 mg/dL (ref 0.00–1.89)
Potassium: 4.1 meq/L (ref 3.5–5.3)
Sodium: 143 meq/L (ref 135–145)
Total Bilirubin: 0.6 mg/dL (ref 0.3–1.2)
Total CHOL/HDL Ratio: 2.2
Total Protein: 7.4 g/dL (ref 6.0–8.3)
Triglycerides: 63 mg/dL (ref <150)
VLDL: 13 mg/dL (ref 0–40)

## 2008-06-04 ENCOUNTER — Encounter (INDEPENDENT_AMBULATORY_CARE_PROVIDER_SITE_OTHER): Payer: Self-pay | Admitting: Internal Medicine

## 2008-06-04 ENCOUNTER — Ambulatory Visit (HOSPITAL_COMMUNITY): Admission: RE | Admit: 2008-06-04 | Discharge: 2008-06-04 | Payer: Self-pay | Admitting: Internal Medicine

## 2008-06-12 ENCOUNTER — Ambulatory Visit: Payer: Self-pay | Admitting: Cardiology

## 2008-06-12 ENCOUNTER — Encounter (INDEPENDENT_AMBULATORY_CARE_PROVIDER_SITE_OTHER): Payer: Self-pay | Admitting: Internal Medicine

## 2008-06-12 ENCOUNTER — Encounter: Admission: RE | Admit: 2008-06-12 | Discharge: 2008-06-12 | Payer: Self-pay | Admitting: Neurological Surgery

## 2008-07-16 ENCOUNTER — Encounter (INDEPENDENT_AMBULATORY_CARE_PROVIDER_SITE_OTHER): Payer: Self-pay | Admitting: Internal Medicine

## 2008-08-01 ENCOUNTER — Telehealth (INDEPENDENT_AMBULATORY_CARE_PROVIDER_SITE_OTHER): Payer: Self-pay | Admitting: Internal Medicine

## 2008-08-01 DIAGNOSIS — M8580 Other specified disorders of bone density and structure, unspecified site: Secondary | ICD-10-CM | POA: Insufficient documentation

## 2008-08-02 ENCOUNTER — Encounter (INDEPENDENT_AMBULATORY_CARE_PROVIDER_SITE_OTHER): Payer: Self-pay | Admitting: Internal Medicine

## 2008-08-09 ENCOUNTER — Ambulatory Visit: Payer: Self-pay | Admitting: Internal Medicine

## 2008-08-09 ENCOUNTER — Encounter (INDEPENDENT_AMBULATORY_CARE_PROVIDER_SITE_OTHER): Payer: Self-pay | Admitting: Internal Medicine

## 2008-08-09 DIAGNOSIS — M542 Cervicalgia: Secondary | ICD-10-CM | POA: Insufficient documentation

## 2008-08-09 LAB — CONVERTED CEMR LAB
BUN: 16 mg/dL (ref 6–23)
Blood Glucose, Fingerstick: 91
CO2: 24 meq/L (ref 19–32)
Calcium: 8.9 mg/dL (ref 8.4–10.5)
Chloride: 105 meq/L (ref 96–112)
Creatinine, Ser: 0.72 mg/dL (ref 0.40–1.20)
Glucose, Bld: 90 mg/dL (ref 70–99)
HCT: 34.8 % — ABNORMAL LOW (ref 36.0–46.0)
Hemoglobin: 12.2 g/dL (ref 12.0–15.0)
Hgb A1c MFr Bld: 6.7 %
MCHC: 35.1 g/dL (ref 30.0–36.0)
MCV: 84.9 fL (ref 78.0–100.0)
Platelets: 303 10*3/uL (ref 150–400)
Potassium: 4.3 meq/L (ref 3.5–5.3)
RBC: 4.1 M/uL (ref 3.87–5.11)
RDW: 15.9 % — ABNORMAL HIGH (ref 11.5–15.5)
Sodium: 143 meq/L (ref 135–145)
WBC: 6.2 10*3/uL (ref 4.0–10.5)

## 2008-08-10 ENCOUNTER — Telehealth: Payer: Self-pay | Admitting: *Deleted

## 2008-08-15 ENCOUNTER — Telehealth: Payer: Self-pay | Admitting: *Deleted

## 2008-08-15 ENCOUNTER — Encounter (INDEPENDENT_AMBULATORY_CARE_PROVIDER_SITE_OTHER): Payer: Self-pay | Admitting: Internal Medicine

## 2008-08-21 ENCOUNTER — Ambulatory Visit: Payer: Self-pay | Admitting: Internal Medicine

## 2008-08-22 ENCOUNTER — Telehealth (INDEPENDENT_AMBULATORY_CARE_PROVIDER_SITE_OTHER): Payer: Self-pay | Admitting: Internal Medicine

## 2008-08-22 DIAGNOSIS — K5730 Diverticulosis of large intestine without perforation or abscess without bleeding: Secondary | ICD-10-CM | POA: Insufficient documentation

## 2008-08-22 LAB — CONVERTED CEMR LAB
OCCULT 1: POSITIVE
OCCULT 2: POSITIVE
OCCULT 3: POSITIVE

## 2008-08-25 ENCOUNTER — Ambulatory Visit (HOSPITAL_COMMUNITY): Admission: RE | Admit: 2008-08-25 | Discharge: 2008-08-25 | Payer: Self-pay | Admitting: Internal Medicine

## 2008-08-28 ENCOUNTER — Ambulatory Visit: Payer: Self-pay | Admitting: Internal Medicine

## 2008-11-15 ENCOUNTER — Telehealth (INDEPENDENT_AMBULATORY_CARE_PROVIDER_SITE_OTHER): Payer: Self-pay | Admitting: Internal Medicine

## 2008-12-04 ENCOUNTER — Encounter (INDEPENDENT_AMBULATORY_CARE_PROVIDER_SITE_OTHER): Payer: Self-pay | Admitting: *Deleted

## 2008-12-10 ENCOUNTER — Encounter: Admission: RE | Admit: 2008-12-10 | Discharge: 2008-12-10 | Payer: Self-pay | Admitting: Neurological Surgery

## 2008-12-25 ENCOUNTER — Encounter (INDEPENDENT_AMBULATORY_CARE_PROVIDER_SITE_OTHER): Payer: Self-pay | Admitting: Internal Medicine

## 2008-12-27 ENCOUNTER — Ambulatory Visit: Payer: Self-pay | Admitting: Internal Medicine

## 2008-12-27 ENCOUNTER — Encounter (INDEPENDENT_AMBULATORY_CARE_PROVIDER_SITE_OTHER): Payer: Self-pay | Admitting: Internal Medicine

## 2008-12-27 LAB — CONVERTED CEMR LAB
Blood Glucose, Fingerstick: 93
Hgb A1c MFr Bld: 7.2 %

## 2008-12-29 LAB — CONVERTED CEMR LAB
ALT: 8 units/L (ref 0–35)
AST: 12 units/L (ref 0–37)
Albumin: 4.2 g/dL (ref 3.5–5.2)
Alkaline Phosphatase: 83 units/L (ref 39–117)
BUN: 21 mg/dL (ref 6–23)
CO2: 29 meq/L (ref 19–32)
Calcium: 9.2 mg/dL (ref 8.4–10.5)
Chloride: 100 meq/L (ref 96–112)
Cholesterol: 138 mg/dL (ref 0–200)
Creatinine, Ser: 0.69 mg/dL (ref 0.40–1.20)
Glucose, Bld: 83 mg/dL (ref 70–99)
HDL: 61 mg/dL (ref >39)
LDL Cholesterol: 65 mg/dL (ref 0–99)
Magnesium: 1.2 mg/dL — ABNORMAL LOW (ref 1.5–2.5)
Potassium: 3.7 meq/L (ref 3.5–5.3)
Sodium: 143 meq/L (ref 135–145)
Total Bilirubin: 0.4 mg/dL (ref 0.3–1.2)
Total CHOL/HDL Ratio: 2.3
Total Protein: 7.4 g/dL (ref 6.0–8.3)
Triglycerides: 59 mg/dL (ref <150)
VLDL: 12 mg/dL (ref 0–40)

## 2008-12-31 ENCOUNTER — Ambulatory Visit: Payer: Self-pay | Admitting: Gastroenterology

## 2009-01-01 ENCOUNTER — Ambulatory Visit (HOSPITAL_COMMUNITY): Admission: RE | Admit: 2009-01-01 | Discharge: 2009-01-01 | Payer: Self-pay | Admitting: Internal Medicine

## 2009-01-02 ENCOUNTER — Encounter (INDEPENDENT_AMBULATORY_CARE_PROVIDER_SITE_OTHER): Payer: Self-pay | Admitting: Internal Medicine

## 2009-01-03 ENCOUNTER — Encounter (INDEPENDENT_AMBULATORY_CARE_PROVIDER_SITE_OTHER): Payer: Self-pay | Admitting: Internal Medicine

## 2009-01-14 ENCOUNTER — Ambulatory Visit: Payer: Self-pay | Admitting: Gastroenterology

## 2009-01-14 LAB — HM COLONOSCOPY: HM Colonoscopy: NORMAL

## 2009-01-15 ENCOUNTER — Encounter (INDEPENDENT_AMBULATORY_CARE_PROVIDER_SITE_OTHER): Payer: Self-pay | Admitting: Internal Medicine

## 2009-01-15 ENCOUNTER — Ambulatory Visit: Payer: Self-pay | Admitting: Internal Medicine

## 2009-01-15 ENCOUNTER — Encounter: Payer: Self-pay | Admitting: Internal Medicine

## 2009-01-15 LAB — CONVERTED CEMR LAB
BUN: 5 mg/dL — ABNORMAL LOW (ref 6–23)
CO2: 29 meq/L (ref 19–32)
Calcium: 8.9 mg/dL (ref 8.4–10.5)
Chloride: 105 meq/L (ref 96–112)
Creatinine, Ser: 0.53 mg/dL (ref 0.40–1.20)
GFR calc Af Amer: >60 mL/min (ref >60)
GFR calc non Af Amer: >60 mL/min (ref >60)
Glucose, Bld: 144 mg/dL — ABNORMAL HIGH (ref 70–99)
Magnesium: 1.9 mg/dL (ref 1.5–2.5)
Potassium: 4.3 meq/L (ref 3.5–5.3)
Sodium: 141 meq/L (ref 135–145)

## 2009-01-16 LAB — CONVERTED CEMR LAB
HCT: 32.8 % — ABNORMAL LOW (ref 36.0–46.0)
Hemoglobin: 10.9 g/dL — ABNORMAL LOW (ref 12.0–15.0)
MCHC: 33.2 g/dL (ref 30.0–36.0)
MCV: 85.6 fL (ref 78.0–100.0)
Platelets: 302 10*3/uL (ref 150–400)
RBC: 3.83 M/uL — ABNORMAL LOW (ref 3.87–5.11)
RDW: 15.6 % — ABNORMAL HIGH (ref 11.5–15.5)
TSH: 2.215 microintl units/mL (ref 0.350–4.500)
WBC: 4.6 10*3/uL (ref 4.0–10.5)

## 2009-01-22 ENCOUNTER — Encounter (INDEPENDENT_AMBULATORY_CARE_PROVIDER_SITE_OTHER): Payer: Self-pay | Admitting: Internal Medicine

## 2009-01-31 ENCOUNTER — Encounter (INDEPENDENT_AMBULATORY_CARE_PROVIDER_SITE_OTHER): Payer: Self-pay | Admitting: Internal Medicine

## 2009-02-04 ENCOUNTER — Encounter (INDEPENDENT_AMBULATORY_CARE_PROVIDER_SITE_OTHER): Payer: Self-pay | Admitting: Internal Medicine

## 2009-02-04 ENCOUNTER — Ambulatory Visit: Payer: Self-pay | Admitting: Infectious Disease

## 2009-02-04 LAB — CONVERTED CEMR LAB: Blood Glucose, Home Monitor: 2 mg/dL

## 2009-02-05 ENCOUNTER — Telehealth (INDEPENDENT_AMBULATORY_CARE_PROVIDER_SITE_OTHER): Payer: Self-pay | Admitting: Internal Medicine

## 2009-03-13 ENCOUNTER — Ambulatory Visit: Payer: Self-pay | Admitting: Internal Medicine

## 2009-03-13 ENCOUNTER — Encounter (INDEPENDENT_AMBULATORY_CARE_PROVIDER_SITE_OTHER): Payer: Self-pay | Admitting: Internal Medicine

## 2009-03-13 LAB — CONVERTED CEMR LAB: Blood Glucose, Fingerstick: 155

## 2009-04-18 ENCOUNTER — Ambulatory Visit: Payer: Self-pay | Admitting: Internal Medicine

## 2009-04-18 ENCOUNTER — Encounter (INDEPENDENT_AMBULATORY_CARE_PROVIDER_SITE_OTHER): Payer: Self-pay | Admitting: Internal Medicine

## 2009-04-18 LAB — CONVERTED CEMR LAB
Blood Glucose, Home Monitor: 2 mg/dL
Hgb A1c MFr Bld: 7.1 %

## 2009-05-30 ENCOUNTER — Telehealth (INDEPENDENT_AMBULATORY_CARE_PROVIDER_SITE_OTHER): Payer: Self-pay | Admitting: Internal Medicine

## 2009-06-05 ENCOUNTER — Ambulatory Visit (HOSPITAL_COMMUNITY): Admission: RE | Admit: 2009-06-05 | Discharge: 2009-06-05 | Payer: Self-pay | Admitting: Internal Medicine

## 2009-06-05 LAB — HM MAMMOGRAPHY: HM Mammogram: NEGATIVE

## 2009-06-06 ENCOUNTER — Ambulatory Visit: Payer: Self-pay | Admitting: Internal Medicine

## 2009-06-06 LAB — CONVERTED CEMR LAB
BUN: 13 mg/dL (ref 6–23)
Blood Glucose, Fingerstick: 109
CO2: 27 meq/L (ref 19–32)
Calcium: 8.9 mg/dL (ref 8.4–10.5)
Chloride: 106 meq/L (ref 96–112)
Creatinine, Ser: 0.64 mg/dL (ref 0.40–1.20)
Glucose, Bld: 117 mg/dL — ABNORMAL HIGH (ref 70–99)
Potassium: 4.2 meq/L (ref 3.5–5.3)
Sodium: 143 meq/L (ref 135–145)

## 2009-07-25 ENCOUNTER — Ambulatory Visit: Payer: Self-pay | Admitting: Internal Medicine

## 2009-07-25 ENCOUNTER — Encounter (INDEPENDENT_AMBULATORY_CARE_PROVIDER_SITE_OTHER): Payer: Self-pay | Admitting: Internal Medicine

## 2009-07-25 LAB — CONVERTED CEMR LAB
Blood Glucose, Fingerstick: 142
Creatinine, Urine: 120.3 mg/dL
Hgb A1c MFr Bld: 6.7 %
Microalb Creat Ratio: 5.9 mg/g (ref 0.0–30.0)
Microalb, Ur: 0.71 mg/dL (ref 0.00–1.89)

## 2009-08-08 ENCOUNTER — Encounter (INDEPENDENT_AMBULATORY_CARE_PROVIDER_SITE_OTHER): Payer: Self-pay | Admitting: Internal Medicine

## 2009-08-20 ENCOUNTER — Encounter (INDEPENDENT_AMBULATORY_CARE_PROVIDER_SITE_OTHER): Payer: Self-pay | Admitting: Internal Medicine

## 2009-08-22 ENCOUNTER — Encounter: Admission: RE | Admit: 2009-08-22 | Discharge: 2009-08-22 | Payer: Self-pay | Admitting: Neurological Surgery

## 2009-08-23 ENCOUNTER — Encounter (INDEPENDENT_AMBULATORY_CARE_PROVIDER_SITE_OTHER): Payer: Self-pay | Admitting: Internal Medicine

## 2009-10-10 ENCOUNTER — Encounter (INDEPENDENT_AMBULATORY_CARE_PROVIDER_SITE_OTHER): Payer: Self-pay | Admitting: Internal Medicine

## 2009-10-10 ENCOUNTER — Ambulatory Visit: Payer: Self-pay | Admitting: Internal Medicine

## 2009-10-10 ENCOUNTER — Telehealth (INDEPENDENT_AMBULATORY_CARE_PROVIDER_SITE_OTHER): Payer: Self-pay | Admitting: *Deleted

## 2009-10-10 LAB — CONVERTED CEMR LAB: Blood Glucose, Fingerstick: 142

## 2009-10-14 ENCOUNTER — Ambulatory Visit: Payer: Self-pay | Admitting: Cardiology

## 2009-10-15 ENCOUNTER — Ambulatory Visit: Payer: Self-pay | Admitting: Internal Medicine

## 2009-10-15 LAB — CONVERTED CEMR LAB
ALT: 8 units/L (ref 0–35)
AST: 12 units/L (ref 0–37)
Albumin: 4.1 g/dL (ref 3.5–5.2)
Alkaline Phosphatase: 76 units/L (ref 39–117)
BUN: 16 mg/dL (ref 6–23)
CO2: 27 meq/L (ref 19–32)
Calcium: 9.4 mg/dL (ref 8.4–10.5)
Chloride: 101 meq/L (ref 96–112)
Cholesterol: 140 mg/dL (ref 0–200)
Creatinine, Ser: 0.68 mg/dL (ref 0.40–1.20)
Glucose, Bld: 163 mg/dL — ABNORMAL HIGH (ref 70–99)
HDL: 51 mg/dL (ref >39)
LDL Cholesterol: 78 mg/dL (ref 0–99)
Potassium: 4.4 meq/L (ref 3.5–5.3)
Sodium: 141 meq/L (ref 135–145)
Total Bilirubin: 0.5 mg/dL (ref 0.3–1.2)
Total CHOL/HDL Ratio: 2.7
Total Protein: 7.2 g/dL (ref 6.0–8.3)
Triglycerides: 55 mg/dL (ref <150)
VLDL: 11 mg/dL (ref 0–40)

## 2009-11-21 ENCOUNTER — Encounter (INDEPENDENT_AMBULATORY_CARE_PROVIDER_SITE_OTHER): Payer: Self-pay | Admitting: Internal Medicine

## 2009-11-21 ENCOUNTER — Ambulatory Visit: Payer: Self-pay | Admitting: Internal Medicine

## 2009-11-21 LAB — CONVERTED CEMR LAB: Hgb A1c MFr Bld: 7.4 %

## 2009-12-04 ENCOUNTER — Encounter (INDEPENDENT_AMBULATORY_CARE_PROVIDER_SITE_OTHER): Payer: Self-pay | Admitting: Internal Medicine

## 2009-12-06 ENCOUNTER — Telehealth (INDEPENDENT_AMBULATORY_CARE_PROVIDER_SITE_OTHER): Payer: Self-pay | Admitting: Internal Medicine

## 2009-12-24 ENCOUNTER — Ambulatory Visit: Payer: Self-pay | Admitting: Internal Medicine

## 2009-12-27 DIAGNOSIS — E559 Vitamin D deficiency, unspecified: Secondary | ICD-10-CM | POA: Insufficient documentation

## 2009-12-27 DIAGNOSIS — K909 Intestinal malabsorption, unspecified: Secondary | ICD-10-CM | POA: Insufficient documentation

## 2009-12-27 DIAGNOSIS — E538 Deficiency of other specified B group vitamins: Secondary | ICD-10-CM | POA: Insufficient documentation

## 2009-12-27 DIAGNOSIS — D649 Anemia, unspecified: Secondary | ICD-10-CM | POA: Insufficient documentation

## 2009-12-27 DIAGNOSIS — Z8639 Personal history of other endocrine, nutritional and metabolic disease: Secondary | ICD-10-CM | POA: Insufficient documentation

## 2009-12-27 HISTORY — DX: Vitamin D deficiency, unspecified: E55.9

## 2009-12-27 HISTORY — DX: Personal history of other endocrine, nutritional and metabolic disease: Z86.39

## 2010-01-02 ENCOUNTER — Encounter (INDEPENDENT_AMBULATORY_CARE_PROVIDER_SITE_OTHER): Payer: Self-pay | Admitting: Internal Medicine

## 2010-01-02 LAB — CONVERTED CEMR LAB
HCT: 35.2 % — ABNORMAL LOW (ref 36.0–46.0)
Hemoglobin: 11.8 g/dL — ABNORMAL LOW (ref 12.0–15.0)
MCHC: 33.5 g/dL (ref 30.0–36.0)
MCV: 88.4 fL (ref >=78.0)
Magnesium: 1.3 mg/dL — ABNORMAL LOW (ref 1.5–2.5)
Platelets: 310 10*3/uL (ref 150–400)
RBC: 3.98 M/uL (ref 3.87–5.11)
RDW: 15.1 % (ref 11.5–15.5)
Vit D, 25-Hydroxy: 22 ng/mL — ABNORMAL LOW (ref 30–89)
Vitamin B-12: 213 pg/mL (ref 211–911)
WBC: 6.1 10*3/uL (ref 4.0–10.5)

## 2010-01-03 ENCOUNTER — Encounter: Admission: RE | Admit: 2010-01-03 | Discharge: 2010-02-14 | Payer: Self-pay | Admitting: Internal Medicine

## 2010-01-03 ENCOUNTER — Encounter (INDEPENDENT_AMBULATORY_CARE_PROVIDER_SITE_OTHER): Payer: Self-pay | Admitting: Internal Medicine

## 2010-01-08 ENCOUNTER — Encounter (INDEPENDENT_AMBULATORY_CARE_PROVIDER_SITE_OTHER): Payer: Self-pay | Admitting: Internal Medicine

## 2010-01-08 LAB — HM DIABETES EYE EXAM

## 2010-01-16 ENCOUNTER — Encounter (INDEPENDENT_AMBULATORY_CARE_PROVIDER_SITE_OTHER): Payer: Self-pay | Admitting: Internal Medicine

## 2010-01-23 ENCOUNTER — Encounter (INDEPENDENT_AMBULATORY_CARE_PROVIDER_SITE_OTHER): Payer: Self-pay | Admitting: Internal Medicine

## 2010-02-06 ENCOUNTER — Ambulatory Visit: Payer: Self-pay | Admitting: Internal Medicine

## 2010-02-06 DIAGNOSIS — M654 Radial styloid tenosynovitis [de Quervain]: Secondary | ICD-10-CM | POA: Insufficient documentation

## 2010-02-06 LAB — CONVERTED CEMR LAB
Blood Glucose, Fingerstick: 229
RBC Folate: 219 ng/mL (ref 180–600)

## 2010-02-10 ENCOUNTER — Ambulatory Visit (HOSPITAL_COMMUNITY): Admission: RE | Admit: 2010-02-10 | Discharge: 2010-02-10 | Payer: Self-pay | Admitting: Internal Medicine

## 2010-02-10 LAB — CONVERTED CEMR LAB
CRP: 1 mg/dL — ABNORMAL HIGH (ref <0.6)
Ferritin: 34 ng/mL (ref 10–291)
Iron: 44 ug/dL (ref 42–145)
Rhuematoid fact SerPl-aCnc: <20 intl units/mL (ref 0–20)
Saturation Ratios: 15 % — ABNORMAL LOW (ref 20–55)
Sed Rate: 33 mm/hr — ABNORMAL HIGH (ref 0–22)
TIBC: 291 ug/dL (ref 250–470)
UIBC: 247 ug/dL

## 2010-02-12 ENCOUNTER — Encounter (INDEPENDENT_AMBULATORY_CARE_PROVIDER_SITE_OTHER): Payer: Self-pay | Admitting: Internal Medicine

## 2010-02-13 ENCOUNTER — Encounter (INDEPENDENT_AMBULATORY_CARE_PROVIDER_SITE_OTHER): Payer: Self-pay | Admitting: Internal Medicine

## 2010-02-19 ENCOUNTER — Ambulatory Visit: Payer: Self-pay | Admitting: Internal Medicine

## 2010-03-12 ENCOUNTER — Ambulatory Visit: Payer: Self-pay | Admitting: Internal Medicine

## 2010-03-12 LAB — CONVERTED CEMR LAB
Blood Glucose, Fingerstick: 265
Hgb A1c MFr Bld: 8.2 %

## 2010-03-18 ENCOUNTER — Encounter: Payer: Self-pay | Admitting: Internal Medicine

## 2010-03-24 ENCOUNTER — Ambulatory Visit: Payer: Self-pay | Admitting: Family Medicine

## 2010-03-24 DIAGNOSIS — M159 Polyosteoarthritis, unspecified: Secondary | ICD-10-CM | POA: Insufficient documentation

## 2010-03-26 ENCOUNTER — Telehealth: Payer: Self-pay | Admitting: Internal Medicine

## 2010-03-26 ENCOUNTER — Ambulatory Visit: Payer: Self-pay | Admitting: Internal Medicine

## 2010-04-07 ENCOUNTER — Ambulatory Visit: Payer: Self-pay | Admitting: Family Medicine

## 2010-04-18 ENCOUNTER — Ambulatory Visit: Payer: Self-pay | Admitting: Internal Medicine

## 2010-04-18 ENCOUNTER — Encounter: Payer: Self-pay | Admitting: Internal Medicine

## 2010-04-18 LAB — CONVERTED CEMR LAB: Blood Glucose, Fingerstick: 150

## 2010-04-18 LAB — HM DIABETES FOOT EXAM

## 2010-04-25 ENCOUNTER — Ambulatory Visit: Payer: Self-pay | Admitting: Family Medicine

## 2010-05-08 ENCOUNTER — Emergency Department (HOSPITAL_COMMUNITY): Admission: EM | Admit: 2010-05-08 | Discharge: 2010-05-08 | Payer: Self-pay | Admitting: Emergency Medicine

## 2010-06-09 ENCOUNTER — Ambulatory Visit (HOSPITAL_COMMUNITY): Admission: RE | Admit: 2010-06-09 | Discharge: 2010-06-09 | Payer: Self-pay | Admitting: Obstetrics

## 2010-06-16 ENCOUNTER — Encounter: Payer: Self-pay | Admitting: Internal Medicine

## 2010-06-20 ENCOUNTER — Encounter: Payer: Self-pay | Admitting: Internal Medicine

## 2010-06-27 ENCOUNTER — Telehealth: Payer: Self-pay | Admitting: Internal Medicine

## 2010-07-15 ENCOUNTER — Encounter: Payer: Self-pay | Admitting: Internal Medicine

## 2010-08-01 ENCOUNTER — Encounter: Payer: Self-pay | Admitting: Internal Medicine

## 2010-08-15 ENCOUNTER — Ambulatory Visit: Payer: Self-pay | Admitting: Internal Medicine

## 2010-08-15 ENCOUNTER — Telehealth (INDEPENDENT_AMBULATORY_CARE_PROVIDER_SITE_OTHER): Payer: Self-pay | Admitting: *Deleted

## 2010-08-15 DIAGNOSIS — R51 Headache: Secondary | ICD-10-CM | POA: Insufficient documentation

## 2010-08-15 DIAGNOSIS — R519 Headache, unspecified: Secondary | ICD-10-CM | POA: Insufficient documentation

## 2010-08-15 LAB — CONVERTED CEMR LAB
Blood Glucose, Fingerstick: 70
Hgb A1c MFr Bld: 7.5 %

## 2010-08-16 LAB — CONVERTED CEMR LAB
ALT: 11 units/L (ref 0–35)
AST: 13 units/L (ref 0–37)
Albumin: 4.7 g/dL (ref 3.5–5.2)
Alkaline Phosphatase: 75 units/L (ref 39–117)
BUN: 17 mg/dL (ref 6–23)
CO2: 29 meq/L (ref 19–32)
CRP: 0.7 mg/dL — ABNORMAL HIGH (ref <0.6)
Calcium: 9.2 mg/dL (ref 8.4–10.5)
Chloride: 100 meq/L (ref 96–112)
Creatinine, Ser: 0.65 mg/dL (ref 0.40–1.20)
Glucose, Bld: 100 mg/dL — ABNORMAL HIGH (ref 70–99)
Potassium: 4.2 meq/L (ref 3.5–5.3)
Sed Rate: 6 mm/hr (ref 0–22)
Sodium: 140 meq/L (ref 135–145)
Total Bilirubin: 0.4 mg/dL (ref 0.3–1.2)
Total Protein: 7.6 g/dL (ref 6.0–8.3)

## 2010-08-21 ENCOUNTER — Ambulatory Visit: Payer: Self-pay | Admitting: Internal Medicine

## 2010-08-21 LAB — CONVERTED CEMR LAB
BUN: 14 mg/dL (ref 6–23)
CO2: 28 meq/L (ref 19–32)
Calcium: 9.1 mg/dL (ref 8.4–10.5)
Chloride: 100 meq/L (ref 96–112)
Creatinine, Ser: 0.71 mg/dL (ref 0.40–1.20)
Glucose, Bld: 226 mg/dL — ABNORMAL HIGH (ref 70–99)
Potassium: 4 meq/L (ref 3.5–5.3)
Sodium: 139 meq/L (ref 135–145)

## 2010-08-29 ENCOUNTER — Encounter: Payer: Self-pay | Admitting: Internal Medicine

## 2010-11-02 ENCOUNTER — Encounter: Payer: Self-pay | Admitting: Internal Medicine

## 2010-11-11 NOTE — Progress Notes (Signed)
Summary: refill/ hla  Phone Note Refill Request Message from:  Fax from Pharmacy on August 16, 2007 11:56 AM  Refills Requested: Medication #1:  IMDUR 30 MG TB24 Take 1 tablet by mouth once a day   Last Refilled: 07/14/2007 Initial call taken by: Marin Roberts RN,  August 16, 2007 11:57 AM  Follow-up for Phone Call        Refill approved-nurse to complete Follow-up by: Ulyess Mort MD,  August 16, 2007 12:24 PM       Prescriptions: IMDUR 30 MG TB24 (ISOSORBIDE MONONITRATE) Take 1 tablet by mouth once a day  #30 x 3   Entered and Authorized by:   Ulyess Mort MD   Signed by:   Ulyess Mort MD on 08/16/2007   Method used:   Electronically sent to ...       [id number]       [address] [place]  [address]       [place], [place]  [address]       Ph: [phone] or [phone]       Fax: [phone]   RxID:   [id number]

## 2010-11-11 NOTE — Letter (Signed)
Summary: [facility]  [facility]   Imported By: Florinda Marker 01/16/2009 14:32:50  _____________________________________________________________________  External Attachment:    Type:   Image     Comment:   External Document

## 2010-11-11 NOTE — Progress Notes (Signed)
Summary: refill/ hla  Phone Note Refill Request Message from:  Fax from Pharmacy on January 27, 2007 3:10 PM  Refills Requested: Medication #1:  GLUCOPHAGE 1000 MG TABS Take 1 tablet by mouth two times a day Initial call taken by: Marin Roberts RN,  January 27, 2007 3:11 PM  Follow-up for Phone Call        Refill approved-nurse to complete Follow-up by: Margarito Liner MD,  January 27, 2007 3:15 PM  Additional Follow-up for Phone Call Additional follow up Details #1::        Rx faxed to pharmacy Additional Follow-up by: Marin Roberts RN,  January 27, 2007 3:32 PM    Prescriptions: GLUCOPHAGE 1000 MG TABS (METFORMIN HCL) Take 1 tablet by mouth two times a day  #62 x 3   Entered and Authorized by:   Margarito Liner MD   Signed by:   Margarito Liner MD on 01/27/2007   Method used:   Telephoned to ...       [facility]       [address] [place], [place]  [address]  [place]       Ph: [phone]       Fax: [phone]   RxID:   [id number]

## 2010-11-11 NOTE — Progress Notes (Signed)
Summary: Refill request/hla  Phone Note Refill Request Message from:  Fax from Pharmacy on April 19, 2007 2:50 PM  Refills Requested: Medication #1:  IMDUR 30 MG TB24 Take 1 tablet by mouth once a day   Last Refilled: 03/17/2007  Method Requested: Fax to Local Pharmacy Initial call taken by: Henderson Cloud,  April 19, 2007 2:50 PM  Follow-up for Phone Call        Refill approved-nurse to complete. Follow-up by: Margarito Liner MD,  April 19, 2007 3:15 PM  Additional Follow-up for Phone Call Additional follow up Details #1::        Rx faxed to pharmacy Additional Follow-up by: Marin Roberts RN,  April 20, 2007 11:29 AM    Prescriptions: IMDUR 30 MG TB24 (ISOSORBIDE MONONITRATE) Take 1 tablet by mouth once a day  #30 x 3   Entered and Authorized by:   Margarito Liner MD   Signed by:   Margarito Liner MD on 04/19/2007   Method used:   Telephoned to ...       [facility]       [address] [place]  [address]       [place], [place]  [address]       Ph: [phone] or [phone]       Fax: [phone]   RxID:   [id number]

## 2010-11-11 NOTE — Letter (Signed)
Summary: [facility]: CMN  [facility]: CMN   Imported By: Florinda Marker 08/09/2009 14:42:45  _____________________________________________________________________  External Attachment:    Type:   Image     Comment:   External Document

## 2010-11-11 NOTE — Miscellaneous (Signed)
Summary: [facility]  [facility]   Imported By: Florinda Marker 07/17/2008 12:00:33  _____________________________________________________________________  External Attachment:    Type:   Image     Comment:   External Document

## 2010-11-11 NOTE — Letter (Signed)
Summary: Recall Colonoscopy Letter  [facility]  [address] [place], [place] [address]   Phone: [phone]  Fax: [phone]      December 04, 2008 MRN: [id number]   [facility] [address] [place], [place]  [address]   Dear Ms. MASSER,   According to your medical record, it is time for you to schedule a Colonoscopy. The American Cancer Society recommends this procedure as a method to detect early colon cancer. Patients with a family history of colon cancer, or a personal history of colon polyps or inflammatory bowel disease are at increased risk.  This letter has beeen generated based on the recommendations made at the time of your procedure. If you feel that in your particular situation this may no longer apply, please contact our office.  Please call our office at [phone] to schedule this appointment or to update your records at your earliest convenience.  Thank you for cooperating with [place] to provide you with the very best care possible.   Sincerely,  Barbette Hair. Arlyce Dice, M.D.  [facility] [phone]

## 2010-11-11 NOTE — Consult Note (Signed)
Summary: [facility]  [facility]   Imported By: Florinda Marker 01/30/2009 12:21:10  _____________________________________________________________________  External Attachment:    Type:   Image     Comment:   External Document

## 2010-11-11 NOTE — Assessment & Plan Note (Signed)
Summary: EST-PER CARDIC REHAB AND CONE HEARTRATE/WEIGHT RESP/ISSUES X ...   Vital Signs:  Patient Profile:   65 Years Old Female Height:     64 inches Weight:      238.1 pounds BMI:     41.02 Temp:     89.2 degrees F oral Pulse rate:   100 / minute BP sitting:   133 / 72  (right arm) Cuff size:   regular  Pt. in pain?   yes    Location:   legs    Intensity:   4    Type:       stinging  Vitals Entered By: [name] [name] (March 17, 2007 1:53 PM)              Is Patient Diabetic? Yes  Nutritional Status BMI of 19 -24 = normal CBG Result 114  Does patient need assistance? Functional Status Self care Ambulation Normal   PCP:  [name] Beach MD  Chief Complaint:  medication refill /.  History of Present Illness: Pt is a 65yr old lady with h/o DM, HTN, HL, Morbid obesity, chronic SOB and leg pain presents after she and staff at Cardiopulmonary rehab noticed that her HR was fluctuating between normal and high. She has noticed this since 03/15/07. It happens on and off, even when she is resting. This is asso with light headedness, occ diaphoresis and feeling not too good. She denies CP, nausea or vomitting, actually passing out or Vn changes. She has been going to Cardiopulmonary rehab because of SOB that she had presented a few mths ago. she cntinues to have swelling in her legs. She actually feels better and her exercise tolerance has increased since she has been going to rehab. She has had evaluation for her SOB in term of X-ray, venous dopplers for leg swelling, a CBC (Hb was 11.6 in 3/08, pt on Iron supplements for Iron def anemia). Her TSH has been normal in past. She c/o tingling and burning pain in her legs at night especially.  Diabetes Management History:      She has not been enrolled in the "Diabetic Education Program".  She states understanding of dietary principles.  Self foot exams are being performed.  She says that she is exercising.  Type of exercise includes: walking.   She is doing this one day times per week.        Hypoglycemic symptoms are not occurring.  No hyperglycemic symptoms are reported.        There are no symptoms to suggest diabetic complications.  No changes have been made to her treatment plan since last visit.     Current Allergies: No known allergies     Risk Factors: Tobacco use:  never Alcohol use:  no Exercise:  yes    Times per week:  one day    Type:  walking Seatbelt use:  100 %    Physical Exam  General:     alert , well-developed, and well-hydrated. Morbidly obese.  Head:     normocephalic.   Eyes:     vision grossly intact, pupils equal, pupils round, and pupils reactive to light.   Mouth:     pharynx pink and moist and fair dentition.   Neck:     supple and no masses.  No JVD Breasts:     Very large Lungs:     normal respiratory effort, no intercostal retractions, no accessory muscle use, normal breath sounds, no dullness, no crackles, and no  wheezes.   Heart:     regular rhythm, no murmur, no gallop, no rub, and tachycardia.   Abdomen:     soft, non-tender, and normal bowel sounds.   Extremities:     2 edema bil, Leg circumference: Rt 15.5 inches and Lt 16 inches, 4 inches below patella. Homan's neg. No s/o acute inflammation Neurologic:     Non focal    Impression & Recommendations:  Problem # 1:  DYSPNEA ON EXERTION (ICD-786.09) Pt admits to taking her meds reg. She was last seen by [name]. Riley [name] in Apr last yr. Will go ahead and schedule an apt with him. Will recheck a CBC to make sure that her Hb is holding up. Her EKG shows Lt atrial enlargement but otherwise normal. rate is 89/min. PFTs recently checked showed Rest lung disease from her body habitus and her large breasts. Her pulse oxy at rest and on ambulation are 100 and 94-95 resp. Her pulse went up from 90 to 140. This makes me nervous enough to let her go home without lookin ginto detail. SO will go ahead and admit her. [name]. Meredith [name] informed  and agrees with the plan.  Her updated medication list for this problem includes:    Lasix 80 Mg Tabs (Furosemide) .[name]... Take 11/2  tablets by mouth once a day    Lotensin 40 Mg Tabs (Benazepril hcl) .[name]... Take 1 tablet by mouth once a day    Atenolol 100 Mg Tabs (Atenolol) .[name]... Take 1/2 pill daily by mouth.   Problem # 2:  LEG EDEMA (ICD-782.3) I had changed her from Norvasc to Atenolol thinking if that was attributing to her leg edema. But I guess it did not change. Maybe she has diastolic failure, though none from 2 D echo 4/07. Will need to r/o OSA in her, especially for OHS is a sur epossibility in her. She is on Poiglitazone, which again is a red flag in a pt with h/o CAD and persistent leg edema. Her updated medication list for this problem includes:    Lasix 80 Mg Tabs (Furosemide) .[name]... Take 11/2  tablets by mouth once a day   Other Orders: T- Capillary Blood Glucose (82948) T-Hgb A1C (in-house) (16109UE) 12 Lead EKG (12 Lead EKG)  Diabetes Management Assessment/Plan:      The following lipid goals have been established for the patient: Total cholesterol goal of 200; LDL cholesterol goal of 100; HDL cholesterol goal of 40; Triglyceride goal of 200.        Vital Signs:  Patient Profile:   65 Years Old Female Height:     64 inches Weight:      238.1 pounds BMI:     41.02 Temp:     89.2 degrees F oral Pulse rate:   100 / minute BP sitting:   133 / 72 Cuff size:   regular    Location:   legs    Intensity:   4    Type:       stinging             CBG Result 114  Laboratory Results   Blood Tests   Date/Time Recieved: March 17, 2007 2:18 PM  Date/Time Reported: ..................................................................Marland KitchenOren [name]  March 17, 2007 2:18 PM   HGBA1C: 7.1%   (Normal Range: Non-Diabetic - 3-6%   Control Diabetic - 6-8%) CBG Random: 114      Appended Document: EST-PER CARDIC REHAB AND CONE HEARTRATE/WEIGHT RESP/ISSUES X ... APPOINTMENT  WITH [name] [name]  CARDIOLOGY / [name] [name] April 06, 2007 AT 2:15PM. NOTE: PATIENT WAS ADMIT TO THE HOSPITAL. PATIENT IS AWARE OF THIS APPOINTMENT. APPOINTMENT IN HAND. [name] [name] NTII

## 2010-11-11 NOTE — Progress Notes (Signed)
Summary: insulin start prescriptions/dmr  Phone Note Outgoing Call   Summary of Call: needs prescriptions for lantus solostar and oen needles sent to pharmacy- [facility] [address] please.   Jamison Neighbor RD,CDE  March 26, 2010 9:23 AM     Prescriptions: LANTUS SOLOSTAR 100 UNIT/ML SOLN (INSULIN GLARGINE) Inject 5 units at bedtime.  #1 box x 0   Entered and Authorized by:   Blanch Media MD   Signed by:   Blanch Media MD on 03/26/2010   Method used:   Electronically to        [facility] (retail)       [address]       [place], [place]  [id number]       Ph: [phone]       Fax: [phone]   RxID:   [id number] PRODIGY MINI PEN NEEDLES 31G X 5 MM MISC (INSULIN PEN NEEDLE) use to inject lantus insulin one time each day  #100 x 3   Entered and Authorized by:   Blanch Media MD   Signed by:   Blanch Media MD on 03/26/2010   Method used:   Electronically to        [facility] (retail)       [address]       [place], [place]  [id number]       Ph: [phone]       Fax: [phone]   RxID:   [id number]

## 2010-11-11 NOTE — Progress Notes (Signed)
Summary: Refill request/dms  Phone Note Refill Request Message from:  Fax from Pharmacy on April 26, 2007 9:01 AM  Refills Requested: Medication #1:  NEURONTIN 300 MG CAPS Take 1 tablet by mouth once a day   Supply Requested: 31   Last Refilled: 11/12/2006  Method Requested: Fax to Local Pharmacy Initial call taken by: Henderson Cloud,  April 26, 2007 9:02 AM  Follow-up for Phone Call        Rx denied because does not appear that patient is taking. I also can not find indication for it.  Follow-up by: Eliseo Gum MD,  April 26, 2007 9:34 AM  Additional Follow-up for Phone Call Additional follow up Details #1::        Rx denial called/faxed to pharmacy Additional Follow-up by: Henderson Cloud,  April 26, 2007 11:32 AM

## 2010-11-11 NOTE — Letter (Signed)
Summary: [facility]  [facility]   Imported By: Florinda Marker 10/10/2009 13:57:40  _____________________________________________________________________  External Attachment:    Type:   Image     Comment:   External Document

## 2010-11-11 NOTE — Assessment & Plan Note (Signed)
Summary: CHECKUP/SB.   Vital Signs:  Patient Profile:   65 Years Old Female Height:     64 inches (162.56 cm) Weight:      234.7 pounds (106.68 kg) BMI:     40.43 Temp:     97.5 degrees F (36.39 degrees C) oral Pulse rate:   63 / minute BP sitting:   110 / 66  (left arm) Cuff size:   large  Pt. in pain?   yes    Location:   right shoulder pain    Intensity:   6    Type:       sharp  Vitals Entered By: [name] Dull) (May 03, 2008 1:32 PM)              Is Patient Diabetic? Yes  Nutritional Status BMI of > 30 = obese CBG Result 175  Have you ever been in a relationship where you felt threatened, hurt or afraid?No   Does patient need assistance? Functional Status Self care Ambulation Impaired:Risk for fall Comments cane     PCP:  Joaquin Courts  MD  Chief Complaint:  pt here routine f/u and c/o continued pain in right shoulder.  History of Present Illness: Pt is a 65 yo female w/ past medical history of:  Current Problems:  DYSLIPIDEMIA (ICD-272.4) OBESITY (ICD-278.00) CAD (ICD-414.00) DIABETES MELLITUS, TYPE II (ICD-250.00) HYPERTENSION (ICD-401.9) DYSPNEA ON EXERTION (ICD-786.09) ANEMIA, IRON DEFICIENCY (ICD-280.9) POSTMENOPAUSAL BLEEDING (ICD-627.1) DEGENERATIVE JOINT DISEASE (ICD-715.90) SPINAL STENOSIS, LUMBAR (ICD-724.02) GERD (ICD-530.81) DEPRESSION (ICD-311) GLAUCOMA NOS (ICD-365.9)  here for routine f/u.  She notes L thumb pain over the last year and R shoulder pain that started a year ago.  She has had surgery in the past for a rotator cuff injury.  She notes hurting it several months ago but it has hurt for over a year.  She notes taking her blood sugars have been doing well recently.  No low blood sugars.  No fevers, chills, or SOB.  She also has a supplement that is a combo pill of > 15 substances, with the 1st ingredient being carbonate of lime and is inquiring if she should take this for her arthritic pains.      Prior Medications  Reviewed Using: Medication Bottles  Prior Medication List:  ASPIRIN 81 MG TBEC (ASPIRIN) Take 1 tablet by mouth once a day LASIX 80 MG TABS (FUROSEMIDE) Take 1and 1/2  tablets by mouth once a day PAXIL 30 MG TABS (PAROXETINE HCL) Take 2 tablets by mouth once a day LOTENSIN 40 MG TABS (BENAZEPRIL HCL) Take 1 tablet by mouth once a day GLUCOPHAGE 1000 MG TABS (METFORMIN HCL) Take 1 tablet by mouth two times a day GLUCOTROL 10 MG TABS (GLIPIZIDE) Take 1 tablet by mouth two times a day NITROQUICK  SUBL (NITROGLYCERIN SUBL) Use as directed IMDUR 30 MG TB24 (ISOSORBIDE MONONITRATE) Take 1 tablet by mouth once a day PEPCID 20 MG TABS (FAMOTIDINE) Take 1 tablet by mouth two times a day ACTOS 30 MG TABS (PIOGLITAZONE HCL) Take 1 tablet by mouth once a day NEURONTIN 300 MG CAPS (GABAPENTIN) Take 1 tablet by mouth once a day PRAVACHOL 80 MG TABS (PRAVASTATIN SODIUM) Take 1 tablet by mouth once a day FERROUS SULFATE 325 (65 FE) MG TABS (FERROUS SULFATE) Take 1 tablet by mouth two times a day ATENOLOL 100 MG TABS (ATENOLOL) Take 1/2 pill daily by mouth. AMITRIPTYLINE HCL 25 MG  TABS (AMITRIPTYLINE HCL) Take 1 tablet by mouth 30 minutes before bedtime.  MAGNESIUM OXIDE 400 MG  TABS (MAGNESIUM OXIDE) Take 1 tablet by mouth twice daily for low magnesium.   Current Allergies (reviewed today): No known allergies   Past Medical History:    Reviewed history from 08/26/2006 and no changes required:       Current Problems:        DYSLIPIDEMIA (ICD-272.4)       OBESITY (ICD-278.00)       CAD (ICD-414.00)       DIABETES MELLITUS, TYPE II (ICD-250.00)       HYPERTENSION (ICD-401.9)       DYSPNEA ON EXERTION (ICD-786.09)       ANEMIA, IRON DEFICIENCY (ICD-280.9)       POSTMENOPAUSAL BLEEDING (ICD-627.1)       DEGENERATIVE JOINT DISEASE (ICD-715.90)       SPINAL STENOSIS, LUMBAR (ICD-724.02)       GERD (ICD-530.81)       DEPRESSION (ICD-311)       GLAUCOMA NOS (ICD-365.9)         Past Surgical  History:    Reviewed history from 10/19/2007 and no changes required:       Posterior lumbar interfusion surgery at L2-L3 (Dr. Marikay Alar), 07/18/07       Lumbar fusion surgery at L3-L5 (s/p hardware removal in 10/08 on re-exploration), c. 2006       Rotator cuff repair, date unknown   Family History:    Reviewed history from 05/03/2007 and no changes required:       Family = 3 brothers, 9 sisters (1 deceased).         Pt's father and sister developed type II DM.         No MIs or strokes in pt's family of which pt is aware.       No h/o cancer in family.       Pt's 2 sisters have HTN and HL.  Social History:    Reviewed history from 05/03/2007 and no changes required:       Pt doesn't work currently, but used to work at [employer] as a Production assistant, radio.       Single.         Never used tobacco.       No alcohol or illicit drug use.       Does not exercise regularly.   Risk Factors: Tobacco use:  never Alcohol use:  no Exercise:  yes    Times per week:  one day    Type:  walking Seatbelt use:  100 %  Family History Risk Factors:    Family History of MI in females < 56 years old:  no    Family History of MI in males < 11 years old:  no   Review of Systems       As per HPI.   Physical Exam  General:     Alert, pleasant, obese, no distress, walking w/ a cane.   Neck:     No LAD or thyromegaly. Lungs:     Normal respiratory effort, chest expands symmetrically. Lungs are clear to auscultation, no crackles or wheezes. Heart:     Normal rate and regular rhythm. S1 and S2 normal without gallop, murmur, click, rub or other extra sounds. Abdomen:     +BS's, soft, NT, ND, obese. Extremities:     FROM in bilateral shoulders.  No external abnormalites of the shoulder.  No swelling, erythema, or increased warmth of the shoulder or joints in either hand.  1+ bilateral lower extremity edema.    Impression & Recommendations:  Problem # 1:  DYSLIPIDEMIA (ICD-272.4) Will check  CMET/ lipids, goal LDL < 70 given DM.   Her updated medication list for this problem includes:    Pravachol 80 Mg Tabs (Pravastatin sodium) .[name]... Take 1 tablet by mouth once a day  Orders: T-Comprehensive Metabolic Panel [id number]) T-Lipid Profile [id number])  Labs Reviewed: SGOT: 12 (10/19/2007)   SGPT: 11 (10/19/2007)   Problem # 2:  DIABETES MELLITUS, TYPE II (ICD-250.00) Doing a great job!  a1c at goal, up to date on eye exam, BP at goal.  Will check lipids.   Will need foot exam at her next visit.    Her updated medication list for this problem includes:    Aspirin 81 Mg Tbec (Aspirin) .[name]... Take 1 tablet by mouth once a day    Lotensin 40 Mg Tabs (Benazepril hcl) .[name]... Take 1 tablet by mouth once a day    Glucophage 1000 Mg Tabs (Metformin hcl) .[name]... Take 1 tablet by mouth two times a day    Glucotrol 10 Mg Tabs (Glipizide) .[name]... Take 1 tablet by mouth two times a day    Actos 30 Mg Tabs (Pioglitazone hcl) .[name]... Take 1 tablet by mouth once a day  Orders: T- Capillary Blood Glucose (29562) T-Hgb A1C (in-house) (13086VH) T-Urine Microalbumin w/creat. ratio [id number] / [id number])  Labs Reviewed: HgBA1c: 6.7 (05/03/2008)   Creat: 0.83 (02/10/2008)   Microalbumin: 0.20 (05/03/2007)  Last Eye Exam: No diabetic retinopathy.    VA: 20/20 OD, 20/20 OS IOP: 17 OD, 18 OS  Performed by Dr. Dione Booze (01/06/2008)   Problem # 3:  ARM PAIN, RIGHT (ICD-729.5) Evidence of arthritic changes on plain films in 04/09.  Levator scapulae tender on exam.  Will try short course of aleve to see if this assists w/ symptoms.  No evidence on exam for severe rotator cuff injury b/c pt is able to abduct the arm fully at this pt but may need to be referred back to her orthopedic if symptoms persist.  Advised her not to use herbal supplement b/c one out of the 15+ ingredients will likely interact somehow w/ her medications.  Problem # 4:  HYPERTENSION (ICD-401.9) Checking electrolytes, BP at goal.   Her updated medication list for this problem includes:    Lasix 80 Mg Tabs (Furosemide) .[name]... Take 1and 1/2  tablets by mouth once a day    Lotensin 40 Mg Tabs (Benazepril hcl) .[name]... Take 1 tablet by mouth once a day    Atenolol 100 Mg Tabs (Atenolol) .[name]... Take 1/2 pill daily by mouth.   Problem # 5:  HEALTH SCREENING (ICD-V70.0) Will screen for osteoporosis. Last mammogram 07/08.  Pt set up for mammogram next year.  Pt also had a colonoscopy about 5 years ago and will try to get those records.  Will give stool cards at her next visit and discuss risk factors for cervical cancer to see if she needs repeat pap.   Orders: Dexa scan (Dexa scan)   Complete Medication List: 1)  Aspirin 81 Mg Tbec (Aspirin) .... Take 1 tablet by mouth once a day 2)  Lasix 80 Mg Tabs (Furosemide) .... Take 1and 1/2  tablets by mouth once a day 3)  Paxil 30 Mg Tabs (Paroxetine hcl) .... Take 2 tablets by mouth once a day 4)  Lotensin 40 Mg Tabs (Benazepril hcl) .... Take 1 tablet by mouth once a day 5)  Glucophage 1000 Mg Tabs (  Metformin hcl) .... Take 1 tablet by mouth two times a day 6)  Glucotrol 10 Mg Tabs (Glipizide) .... Take 1 tablet by mouth two times a day 7)  Nitroquick Subl (Nitroglycerin subl) .... Use as directed 8)  Imdur 30 Mg Tb24 (Isosorbide mononitrate) .... Take 1 tablet by mouth once a day 9)  Pepcid 20 Mg Tabs (Famotidine) .... Take 1 tablet by mouth two times a day 10)  Actos 30 Mg Tabs (Pioglitazone hcl) .... Take 1 tablet by mouth once a day 11)  Neurontin 300 Mg Caps (Gabapentin) .... Take 1 tablet by mouth once a day 12)  Pravachol 80 Mg Tabs (Pravastatin sodium) .... Take 1 tablet by mouth once a day 13)  Ferrous Sulfate 325 (65 Fe) Mg Tabs (Ferrous sulfate) .... Take 1 tablet by mouth two times a day 14)  Atenolol 100 Mg Tabs (Atenolol) .... Take 1/2 pill daily by mouth. 15)  Amitriptyline Hcl 25 Mg Tabs (Amitriptyline hcl) .... Take 1 tablet by mouth 30 minutes before bedtime. 16)   Magnesium Oxide 400 Mg Tabs (Magnesium oxide) .... Take 1 tablet by mouth twice daily for low magnesium. 17)  Aleve 220 Mg Caps (Naproxen sodium) .... Two pills by mouth twice daily as needed for pain.   Patient Instructions: 1)  Please make a followup appointment in three months, or sooner, if needed. 2)  Please see Dr. Riley Kill. 3)  Please take naproxen for pain. 4)  Please get your dexa scan done.   Prescriptions: MAGNESIUM OXIDE 400 MG  TABS (MAGNESIUM OXIDE) Take 1 tablet by mouth twice daily for low magnesium.  #60 x 11   Entered and Authorized by:   Joaquin Courts  MD   Signed by:   Joaquin Courts  MD on 05/03/2008   Method used:   Electronically sent to ...       [id number]       [address]       [place], [place]  [address]       Ph: [phone] or [phone]       Fax: [phone]   RxID:   [id number] FERROUS SULFATE 325 (65 FE) MG TABS (FERROUS SULFATE) Take 1 tablet by mouth two times a day  #62 x 5   Entered and Authorized by:   Joaquin Courts  MD   Signed by:   Joaquin Courts  MD on 05/03/2008   Method used:   Electronically sent to ...       [id number]       [address]  [address]       [place], [place]  [address]       Ph: [phone] or [phone]       Fax: [phone]   RxID:   [id number] PRAVACHOL 80 MG TABS (PRAVASTATIN SODIUM) Take 1 tablet by mouth once a day  #31 x 5   Entered and Authorized by:   Joaquin Courts  MD   Signed by:   Joaquin Courts  MD on 05/03/2008   Method used:   Electronically sent to ...       [id number]       [address] [facility]  [address]       [place], [place]  [address]  Ph: [phone] or [phone]       Fax: [phone]   RxID:   [id number] NEURONTIN 300 MG CAPS (GABAPENTIN) Take 1 tablet by mouth once a day  #31 x 5   Entered and Authorized by:   Joaquin Courts  MD    Signed by:   Joaquin Courts  MD on 05/03/2008   Method used:   Electronically sent to ...       [id number]       [address]  [address]       [place], [place]  [address]       Ph: [phone] or [phone]       Fax: [phone]   RxID:   [id number] ACTOS 30 MG TABS (PIOGLITAZONE HCL) Take 1 tablet by mouth once a day  #30 x 5   Entered and Authorized by:   Joaquin Courts  MD   Signed by:   Joaquin Courts  MD on 05/03/2008   Method used:   Electronically sent to ...       [id number]       [address]  [address]       [place], [place]  [address]       Ph: [phone] or [phone]       Fax: [phone]   RxID:   [id number] PEPCID 20 MG TABS (FAMOTIDINE) Take 1 tablet by mouth two times a day  #60 x 5   Entered and Authorized by:   Joaquin Courts  MD   Signed by:   Joaquin Courts  MD on 05/03/2008   Method used:   Electronically sent to ...       [id number]       [address]       [place], [place]  [address]       Ph: [phone] or [phone]       Fax: [phone]   RxID:   [id number] IMDUR 30 MG TB24 (ISOSORBIDE MONONITRATE) Take 1 tablet by mouth once a day  #30 x 5   Entered and Authorized by:   Joaquin Courts  MD   Signed by:   Joaquin Courts  MD on 05/03/2008   Method used:   Electronically sent to ...       [id number]       [address]       [place], [place]  [address]       Ph: [phone] or [phone]       Fax: [phone]   RxID:   [id number] GLUCOTROL 10 MG TABS (GLIPIZIDE) Take 1 tablet by mouth two times a day  #60 x 5   Entered and Authorized by:   Joaquin Courts  MD   Signed by:   Joaquin Courts  MD on 05/03/2008   Method used:   Electronically sent to ...       [id number]       [address]       [place], [place]  [address]       Ph: [phone] or [phone]  Fax: [phone]   RxID:   [id number] GLUCOPHAGE 1000 MG TABS (METFORMIN HCL) Take 1 tablet by mouth two times a day  #62 x 5   Entered and Authorized by:   Joaquin Courts  MD   Signed by:   Joaquin Courts  MD on 05/03/2008   Method used:   Electronically sent to ...       [id number]       [address]       [place], [place]  [address]       Ph: [phone] or [phone]       Fax: [phone]   RxID:   [id number] LOTENSIN 40 MG TABS (BENAZEPRIL HCL) Take 1 tablet by mouth once a day  #31 x 5   Entered and Authorized by:   Joaquin Courts  MD   Signed by:   Joaquin Courts  MD on 05/03/2008   Method used:   Electronically sent to ...       [id number]       [address]       [place], [place]  [address]       Ph: [phone] or [phone]       Fax: [phone]   RxID:   [id number] PAXIL 30 MG TABS (PAROXETINE HCL) Take 2 tablets by mouth once a day  #62 x 5   Entered and Authorized by:   Joaquin Courts  MD   Signed by:   Joaquin Courts  MD on 05/03/2008   Method used:   Electronically sent to ...       [id number]       [address]       [place], [place]  [address]       Ph: [phone] or [phone]       Fax: [phone]   RxID:   [id number] LASIX 80 MG TABS (FUROSEMIDE) Take 1and 1/2  tablets by mouth once a day  #45 x 5   Entered and Authorized by:   Joaquin Courts  MD   Signed by:   Joaquin Courts  MD on 05/03/2008   Method used:   Electronically sent to ...       [id number]       [address]       [place], [place]  [address]       Ph: [phone] or [phone]       Fax: [phone]   RxID:   [id number] ASPIRIN 81 MG TBEC  (ASPIRIN) Take 1 tablet by mouth once a day  #30 x 11   Entered and Authorized by:   Joaquin Courts  MD   Signed by:   Joaquin Courts  MD on 05/03/2008   Method used:   Electronically sent to ...       [id number]       [address]       [place], [place]  [address]       Ph: [phone] or [phone]       Fax: [phone]  RxID:   [id number] ALEVE 220 MG  CAPS (NAPROXEN SODIUM) Two pills by mouth twice daily as needed for pain.  #120 x prn   Entered and Authorized by:   Joaquin Courts  MD   Signed by:   Joaquin Courts  MD on 05/03/2008   Method used:   Electronically sent to ...       [id number]       [address]       [place], [place]  [address]       Ph: [phone] or [phone]       Fax: [phone]   RxID:   [id number]  ] Laboratory Results   Blood Tests   Date/Time Received: May 03, 2008 1:43 PM Date/Time Reported: Alric Quan  May 03, 2008 1:43 PM  HGBA1C: 6.7%   (Normal Range: Non-Diabetic - 3-6%   Control Diabetic - 6-8%) CBG Random:: 175mg /dL

## 2010-11-11 NOTE — Miscellaneous (Signed)
Summary: pulmonary rehab: [facility]  pulmonary rehab: [facility]   Imported By: Florinda Marker 05/20/2007 11:19:07  _____________________________________________________________________  External Attachment:    Type:   Image     Comment:   External Document

## 2010-11-11 NOTE — Assessment & Plan Note (Signed)
Summary: RA/NEEDS F/U VISIT PER [facility]   Vital Signs:  Patient profile:   65 year old female Height:      64 inches (162.56 cm) Weight:      222.2 pounds (101.00 kg) BMI:     38.28 Temp:     96.7 degrees F (35.94 degrees C) oral Pulse rate:   69 / minute BP sitting:   136 / 62  (right arm)  Vitals Entered By: Chinita Pester RN (April 18, 2010 2:45 PM) CC: F/U visit. Is Patient Diabetic? Yes Did you bring your meter with you today? Yes Pain Assessment Patient in pain? no      Nutritional Status BMI of > 30 = obese CBG Result 150  Have you ever been in a relationship where you felt threatened, hurt or afraid?No   Does patient need assistance? Functional Status Self care Ambulation Impaired:Risk for fall Comments uses a cane    Primary Care Provider:  Nelda Bucks DO  CC:  F/U visit.[name]  History of Present Illness: Pt is a 65 y/o F with PMH outlined in the EMR who presents today for routine f/u of her chronic medical conditions:  1. DM2: pt is doing well with her Lantus and is comfortable with self-injecting.  Taking meds as directed. Her meter reveals CBGs averaging aroung 120-170; lowest observed in the high 80s, highest reading in the mid 200s.  Pt is acheiving excellent blood sugar control.    2. HTN: pt taking all meds as directed without adverse effect.  Denies H/A, visual changes, dizziness, synope and focal neurological deficit.  3. CAD: follows with Dr. Shanon Brow.  Taking meds as directed.  Denies C/P, SOB, and DOE  4. GERD: stable  5. L wrist tenosynovitis:  [facility]  Pt is wearing her splint.  Feels her pain is improving.  States her next appt with Dr. Jennette Kettle is next week.  Pt does not have any other concerns or complaints.  Denies fever, chills, abdominal pain, fatigue, and NVD.  Depression History:      The patient denies a depressed mood most of the day and a diminished interest in her usual daily activities.          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) 2)  De Quervain's Tenosynovitis, Left Wrist  (ICD-727.04) 3)  Vitamin D Deficiency  (ICD-268.9) 4)  Anemia, Normocytic  (ICD-285.9) 5)  Vitamin B12 Deficiency  (ICD-266.2) 6)  Accidental Fall  (ICD-E888.9) 7)  Cad  (ICD-414.00) 8)  Hypertension  (ICD-401.9) 9)  Dyslipidemia  (ICD-272.4) 10)  Neck Pain  (ICD-723.1) 11)  Obesity  (ICD-278.00) 12)  Gerd  (ICD-530.81) 13)  Diabetes Mellitus, Type II  (ICD-250.00) 14)  Spinal Stenosis, Lumbar  (ICD-724.02) 15)  Depression  (ICD-311) 16)  Glaucoma Nos  (ICD-365.9) 17)  Hemorrhoids  (ICD-455.6) 18)  Hypomagnesemia  (ICD-275.2) 19)  Diverticulosis of Colon  (ICD-562.10) 20)  Osteopenia  (ICD-733.90) 21)  Health Screening  (ICD-V70.0) 22)  Other Screening Mammogram  (ICD-V76.12) 23)  Anemia, Iron Deficiency  (ICD-280.9) 24)  Degenerative Joint Disease  (ICD-715.90)  Current Medications (verified): 1)  Aspirin 81 Mg Tbec (Aspirin) .... Take 1 Tablet By  Mouth Once A Day 2)  Lasix 80 Mg Tabs (Furosemide) .... Take 1/2  Tablet By Mouth Once A Day 3)  Paxil 30 Mg Tabs (Paroxetine Hcl) .... Take 1 Tablet By Mouth Once A Day. 4)  Lotensin 40 Mg Tabs (Benazepril Hcl) .... Take 1 Tablet By Mouth Once A Day 5)  Glucophage 1000 Mg Tabs (Metformin Hcl) .... Take 1 Tablet By Mouth Two Times A Day 6)  Glucotrol 10 Mg Tabs (Glipizide) .... Take 2 Tablets By Mouth Two Times A Day 7)  Nitroquick  Subl (Nitroglycerin Subl) .... Use As Directed 8)  Imdur 30 Mg Tb24 (Isosorbide Mononitrate) .... Take 1 Tablet By Mouth Once A Day 9)  Pravachol 80 Mg Tabs (Pravastatin Sodium) .... Take 1 Tablet By Mouth Once A Day 10)  Ferrous Sulfate 325  (65 Fe) Mg Tabs (Ferrous Sulfate) .... Take 1 Tablet By Mouth Two Times A Day 11)  Atenolol 100 Mg Tabs (Atenolol) .... Take 1/2 Pill Daily By Mouth. 12)  Tylenol 325 Mg Tabs (Acetaminophen) .... Two Tabs Every Eight Hours As Needed Forpain. 13)  Calcium Carbonate 600 Mg Tabs (Calcium Carbonate) .... Take 1 Tablet By Mouth Two Times A Day 14)  Tylenol With Codeine #3 300-30 Mg Tabs (Acetaminophen-Codeine) .... Take 1 Tablet By Mouth Two Times A Day As Needed For Pain 15)  Cobal-1000 1000 Mcg/ml Soln (Cyanocobalamin) .... Inject 1 Ml Once Monthly. 16)  Vitamin D3 1000 Unit Tabs (Cholecalciferol) .... Take 1 Tablet By Mouth Once A Day 17)  Mag-Delay 535 (64 Mg) Mg Cr-Tabs (Magnesium Chloride) .... Take 1 Tablet By Mouth Two Times A Day 18)  Voltaren 1 % Gel (Diclofenac Sodium) .... Apply Small Amount To Affected Area Up To 4 Times A Day. 19)  Syringe 23g X 3/4" 3 Ml Misc (Syringe/needle (Disp)) .... Use To Injection B12. 20)  Lantus Solostar 100 Unit/ml Soln (Insulin Glargine) .... Inject 5 Units At Bedtime. 21)  Naproxen 375 Mg Tabs (Naproxen) .[name].. 1 By Mouth Two Times A Day For 2 Weeks and Then Bod As Needed Hand Pain 22)  Prodigy Mini Pen Needles 31g X 5 Mm Misc (Insulin Pen Needle) .... Use To Inject Lantus Insulin One Time Each Day  Allergies (verified): No Known Drug Allergies  Past History:  Past medical, surgical, family and social histories (including risk factors) reviewed for relevance to current acute and chronic problems.  Past Medical History: Reviewed history from 02/06/2010 and no changes required. Current Problems:  CAD (ICD-414.00) HYPERTENSION (ICD-401.9) DYSLIPIDEMIA (ICD-272.4) DYSPNEA ON EXERTION (ICD-786.09) LEG EDEMA (ICD-782.3) NECK PAIN (ICD-723.1) OBESITY (ICD-278.00) GERD (ICD-530.81) DIABETES MELLITUS, TYPE II (ICD-250.00) SPINAL STENOSIS, LUMBAR (ICD-724.02) DEPRESSION (ICD-311) GLAUCOMA NOS (ICD-365.9) HEMORRHOIDS (ICD-455.6) HYPOMAGNESEMIA  (ICD-275.2) DIVERTICULOSIS OF COLON (ICD-562.10) OSTEOPENIA (ICD-733.90) HEALTH SCREENING (ICD-V70.0) ARM PAIN, RIGHT (ICD-729.5) OTHER SCREENING MAMMOGRAM (ICD-V76.12) HYPOKALEMIA (ICD-276.8) ANEMIA, IRON DEFICIENCY (ICD-280.9) DEGENERATIVE JOINT DISEASE (ICD-715.90) B12 def- B12 level 213 on 03/11  Past Surgical History: Reviewed history from 08/02/2009 and no changes required. Posterior lumbar interfusion surgery at L2-L3 (Dr. Marikay Alar), 07/18/07 Lumbar fusion surgery at L3-L5 (s/p hardware removal in 10/08 on re-exploration), c. 2006 Rotator cuff repair, date unknown  History of shoulder surgery. Cholecystectomy.  endometrial biopsy.  Family History: Reviewed history from 08/02/2009 and no changes required. Family = 3 brothers, 9 sisters (1 deceased).   Pt's father and sister developed type II DM.   No MIs or strokes in pt's family of which pt is aware. No h/o cancer in family. Pt's 2 sisters  have HTN and HL.  Social History: Reviewed history from 08/02/2009 and no changes required. 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 Single.   Never used tobacco. No alcohol or illicit drug use. Does exercise regularly. Walks 3 times/wk. [facility]es to exercise at [facility]  Review of Systems      See HPI  Physical Exam  General:  alert, well-developed, and overweight-appearing.   Head:  normocephalic and atraumatic.   Eyes:  wearing glasses, sclera are anicteric.  Mouth:  MMM. Lungs:  Normal respiratory effort, chest expands symmetrically. Lungs are clear to auscultation, no crackles or wheezes. Heart:  Normal rate and regular rhythm, no m/r/g. Abdomen:  obese, soft, NT, +BS's, ND.  Msk:  Pt wearing spint on left wrist. Extremities:  1+ left pedal edema, trace right pedal edema.  No clubbing or cyanosis. Neurologic:  alert & oriented X3, cranial nerves II-XII intact, strength normal in all extremities, and sensation intact to light touch.  Uses cane  to walk. Skin:  turgor normal and color normal.   Psych:  normally interactive, good eye contact, not anxious appearing, and not depressed appearing.     Impression & Recommendations:  Problem # 1:  HYPERTENSION (ICD-401.9) Bp improved since last visit.  Will continue current medication regimen, however may need to adjust dosing to acheive goal BP.  Her updated medication list for this problem includes:    Lasix 80 Mg Tabs (Furosemide) .[name]... Take 1/2  tablet by mouth once a day    Lotensin 40 Mg Tabs (Benazepril hcl) .[name]... Take 1 tablet by mouth once a day    Atenolol 100 Mg Tabs (Atenolol) .[name]... Take 1/2 pill daily by mouth.  BP today: 136/62 Prior BP: 159/77 (04/07/2010)  Labs Reviewed: K+: 4.4 (10/15/2009) Creat: : 0.68 (10/15/2009)   Chol: 140 (10/15/2009)   HDL: 51 (10/15/2009)   LDL: 78 (10/15/2009)   TG: 55 (10/15/2009)  Problem # 2:  DIABETES MELLITUS, TYPE II (ICD-250.00) Pt is doing well with addition of Lantus to her medication regimen.  She is not yet due for A1c.  Will consider d/c of glucotrol with concominant increase of Lantus at next visit.  WIll perform annual foot exam today.  Pt is UTD with eye exam.  Will bring her back in 2-90months for review of CBGs and check of A1c.  Her updated medication list for this problem includes:    Aspirin 81 Mg Tbec (Aspirin) .[name]... Take 1 tablet by mouth once a day    Lotensin 40 Mg Tabs (Benazepril hcl) .[name]... Take 1 tablet by mouth once a day    Glucophage 1000 Mg Tabs (Metformin hcl) .[name]... Take 1 tablet by mouth two times a day    Glucotrol 10 Mg Tabs (Glipizide) .[name]... Take 2 tablets by mouth two times a day    Lantus Solostar 100 Unit/ml Soln (Insulin glargine) ..... Inject 5 units at bedtime.  Orders: Capillary Blood Glucose/CBG (62130)  Problem # 3:  CAD (ICD-414.00) Stable.  Will f/u notes from pts next office visit with Dr. Shanon Brow.  Her updated medication list for this problem includes:    Aspirin 81 Mg Tbec (Aspirin)  .[name]... Take 1 tablet by mouth once a day    Lasix 80 Mg Tabs (Furosemide) .[name]... Take 1/2  tablet by mouth once a day    Lotensin 40 Mg Tabs (Benazepril hcl) .[name]... Take 1 tablet by mouth once a day    Nitroquick Subl (Nitroglycerin subl) .Marland KitchenMarland KitchenMarland KitchenMarland Kitchen  Use as directed    Imdur 30 Mg Tb24 (Isosorbide mononitrate) .[name]... Take 1 tablet by mouth once a day    Atenolol 100 Mg Tabs (Atenolol) .[name]... Take 1/2 pill daily by mouth.  Problem # 4:  Preventive Health Care (ICD-V70.0) Will refer pt for annual mammogram today.  She is UTD with colonoscopy.  Complete Medication List: 1)  Aspirin 81 Mg Tbec (Aspirin) .... Take 1 tablet by mouth once a day 2)  Lasix 80 Mg Tabs (Furosemide) .... Take 1/2  tablet by mouth once a day 3)  Paxil 30 Mg Tabs (Paroxetine hcl) .... Take 1 tablet by mouth once a day. 4)  Lotensin 40 Mg Tabs (Benazepril hcl) .... Take 1 tablet by mouth once a day 5)  Glucophage 1000 Mg Tabs (Metformin hcl) .... Take 1 tablet by mouth two times a day 6)  Glucotrol 10 Mg Tabs (Glipizide) .... Take 2 tablets by mouth two times a day 7)  Nitroquick Subl (Nitroglycerin subl) .... Use as directed 8)  Imdur 30 Mg Tb24 (Isosorbide mononitrate) .... Take 1 tablet by mouth once a day 9)  Pravachol 80 Mg Tabs (Pravastatin sodium) .... Take 1 tablet by mouth once a day 10)  Ferrous Sulfate 325 (65 Fe) Mg Tabs (Ferrous sulfate) .... Take 1 tablet by mouth two times a day 11)  Atenolol 100 Mg Tabs (Atenolol) .... Take 1/2 pill daily by mouth. 12)  Tylenol 325 Mg Tabs (Acetaminophen) .... Two tabs every eight hours as needed forpain. 13)  Calcium Carbonate 600 Mg Tabs (Calcium carbonate) .... Take 1 tablet by mouth two times a day 14)  Tylenol With Codeine #3 300-30 Mg Tabs (Acetaminophen-codeine) .... Take 1 tablet by mouth two times a day as needed for pain 15)  Cobal-1000 1000 Mcg/ml Soln (Cyanocobalamin) .... Inject 1 ml once monthly. 16)  Vitamin D3 1000 Unit Tabs (Cholecalciferol) .... Take 1 tablet by  mouth once a day 17)  Mag-delay 535 (64 Mg) Mg Cr-tabs (Magnesium chloride) .... Take 1 tablet by mouth two times a day 18)  Voltaren 1 % Gel (Diclofenac sodium) .... Apply small amount to affected area up to 4 times a day. 19)  Syringe 23g X 3/4" 3 Ml Misc (Syringe/needle (disp)) .... Use to injection b12. 20)  Lantus Solostar 100 Unit/ml Soln (Insulin glargine) .... Inject 5 units at bedtime. 21)  Naproxen 375 Mg Tabs (Naproxen) .[name].. 1 by mouth two times a day for 2 weeks and then bod as needed hand pain 22)  Prodigy Mini Pen Needles 31g X 5 Mm Misc (Insulin pen needle) .... Use to inject lantus insulin one time each day  Other Orders: Mammogram (Screening) (Mammo)  Patient Instructions: 1)  Please schedule a follow-up appointment in 2-3 months. 2)  Call the clinic with any questions or concerns. 3)  Keep up the good work!  Prevention & Chronic Care Immunizations   Influenza vaccine: Fluvax 3+  (07/25/2009)   Influenza vaccine deferral: Deferred  (12/24/2009)    Tetanus booster: Not documented    Pneumococcal vaccine: Pneumovax  (02/06/2010)    H. zoster vaccine: Not documented   H. zoster vaccine deferral: Refused  (03/12/2010)  Colorectal Screening   Hemoccult: Not documented    Colonoscopy:  Results: Hemorrhoids.       (01/14/2009)   Colonoscopy action/deferral: Repeat colonoscopy in 10 years.    (01/14/2009)   Colonoscopy due: 01/2019  Other Screening   Pap smear: Not documented   Pap smear action/deferral: Not indicated-other  (  03/12/2010)    Mammogram: ASSESSMENT: Negative - BI-RADS 1^MM DIGITAL SCREENING  (06/05/2009)   Mammogram action/deferral: Ordered  (04/18/2010)   Mammogram due: 06/2009    DXA bone density scan: L femoral neck  t score -1.5 c/w osteopenia   (06/04/2008)   DXA scan due: 06/2010    Smoking status: never  (04/18/2010)  Diabetes Mellitus   HgbA1C: 8.2  (03/12/2010)   Hemoglobin A1C due: 07/11/2010    Eye exam: No diabetic  retinopathy.     (01/08/2010)   Eye exam due: 01/2011    Foot exam: yes  (10/10/2009)   Foot exam action/deferral: Do today   High risk foot: Not documented   Foot care education: Not documented    Urine microalbumin/creatinine ratio: 5.9  (07/25/2009)   Urine microalbumin action/deferral: Ordered    Diabetes flowsheet reviewed?: Yes   Progress toward A1C goal: Unchanged  Lipids   Total Cholesterol: 140  (10/15/2009)   LDL: 78  (10/15/2009)   LDL Direct: Not documented   HDL: 51  (10/15/2009)   Triglycerides: 55  (10/15/2009)    SGOT (AST): 12  (10/15/2009)   SGPT (ALT): 8  (10/15/2009)   Alkaline phosphatase: 76  (10/15/2009)   Total bilirubin: 0.5  (10/15/2009)    Lipid flowsheet reviewed?: Yes   Progress toward LDL goal: Unchanged  Hypertension   Last Blood Pressure: 136 / 62  (04/18/2010)   Serum creatinine: 0.68  (10/15/2009)   Serum potassium 4.4  (10/15/2009)    Hypertension flowsheet reviewed?: Yes   Progress toward BP goal: Improved  Self-Management Support :   Personal Goals (by the next clinic visit) :     Personal A1C goal: 7  (07/25/2009)     Personal blood pressure goal: 130/80  (07/25/2009)     Personal LDL goal: 100  (07/25/2009)    Patient will work on the following items until the next clinic visit to reach self-care goals:     Medications and monitoring: examine my feet every day  (04/18/2010)     Eating: eat more vegetables, use fresh or frozen vegetables, eat foods that are low in salt, eat baked foods instead of fried foods, eat fruit for snacks and desserts  (04/18/2010)     Activity: take a 30 minute walk every day  (03/12/2010)    Diabetes self-management support: Copy of home glucose meter record, Written self-care plan  (04/18/2010)   Diabetes care plan printed   Last diabetes self-management training by diabetes educator: 03/26/2010    Hypertension self-management support: Written self-care plan  (04/18/2010)   Hypertension self-care  plan printed.    Lipid self-management support: Written self-care plan  (04/18/2010)   Lipid self-care plan printed.   Nursing Instructions: Schedule screening mammogram (see order) Diabetic foot exam today     Appended Document: RA/NEEDS F/U VISIT PER [facility]    Diabetic Foot Exam Last Podiatry Exam Date: 04/18/2010  Foot Inspection Is there a history of a foot ulcer?              No Is there a foot ulcer now?              No Can the patient see the bottom of their feet?          Yes Are the shoes appropriate in style and fit?          Yes Is there swelling or an abnormal foot shape?          No Are the  toenails long?                No Are the toenails thick?                No Are the toenails ingrown?              No Is there heavy callous build-up?              No  Diabetic Foot Care Education Patient educated on appropriate care of diabetic feet.  Pulse Check          Right Foot          Left Foot Dorsalis Pedis:        normal            normal Comments: Left ankle swollen.   10-g (5.07) Semmes-Weinstein Monofilament Test Performed by: Chinita Pester RN          Right Foot          Left Foot Visual Inspection     normal         normal Test Control      normal         normal Site 1         normal         normal Site 2         normal         normal Site 3         normal         normal Site 4         normal         normal Site 5         normal         normal Site 6         normal         normal Site 7         normal         normal Site 8         normal         normal Site 9         normal         normal   Prevention & Chronic Care Immunizations   Influenza vaccine: Fluvax 3+  (07/25/2009)   Influenza vaccine deferral: Deferred  (12/24/2009)    Tetanus booster: Not documented    Pneumococcal vaccine: Pneumovax  (02/06/2010)    H. zoster vaccine: Not documented   H. zoster vaccine deferral: Refused  (03/12/2010)  Colorectal Screening   Hemoccult: Not  documented    Colonoscopy:  Results: Hemorrhoids.       (01/14/2009)   Colonoscopy action/deferral: Repeat colonoscopy in 10 years.    (01/14/2009)   Colonoscopy due: 01/2019  Other Screening   Pap smear: Not documented   Pap smear action/deferral: Not indicated-other  (03/12/2010)    Mammogram: ASSESSMENT: Negative - BI-RADS 1^MM DIGITAL SCREENING  (06/05/2009)   Mammogram action/deferral: Ordered  (04/18/2010)   Mammogram due: 06/2009    DXA bone density scan: L femoral neck  t score -1.5 c/w osteopenia   (06/04/2008)   DXA scan due: 06/2010    Smoking status: never  (04/18/2010)  Diabetes Mellitus   HgbA1C: 8.2  (03/12/2010)   Hemoglobin A1C due: 07/11/2010    Eye exam: No diabetic retinopathy.     (01/08/2010)   Eye exam due: 01/2011    Foot exam: yes  (10/10/2009)  Foot exam action/deferral: Do today   High risk foot: Not documented   Foot care education: Done  (04/18/2010)    Urine microalbumin/creatinine ratio: 5.9  (07/25/2009)   Urine microalbumin action/deferral: Ordered  Lipids   Total Cholesterol: 140  (10/15/2009)   LDL: 78  (10/15/2009)   LDL Direct: Not documented   HDL: 51  (10/15/2009)   Triglycerides: 55  (10/15/2009)    SGOT (AST): 12  (10/15/2009)   SGPT (ALT): 8  (10/15/2009)   Alkaline phosphatase: 76  (10/15/2009)   Total bilirubin: 0.5  (10/15/2009)  Hypertension   Last Blood Pressure: 136 / 62  (04/18/2010)   Serum creatinine: 0.68  (10/15/2009)   Serum potassium 4.4  (10/15/2009)  Self-Management Support :   Personal Goals (by the next clinic visit) :     Personal A1C goal: 7  (07/25/2009)     Personal blood pressure goal: 130/80  (07/25/2009)     Personal LDL goal: 100  (07/25/2009)    Diabetes self-management support: Copy of home glucose meter record, Written self-care plan  (04/18/2010)   Last diabetes self-management training by diabetes educator: 03/26/2010    Hypertension self-management support: Written self-care  plan  (04/18/2010)    Lipid self-management support: Written self-care plan  (04/18/2010)    Nursing Instructions: Diabetic foot exam today    Appended Document: RA/NEEDS F/U VISIT PER [facility] added foot exam to flow sheet

## 2010-11-11 NOTE — Miscellaneous (Signed)
Summary: Rehab Report:Micro Creatinine Ratio  Rehab Report:Micro Creatinine Ratio   Imported By: Florinda Marker 05/04/2007 15:20:34  _____________________________________________________________________  External Attachment:    Type:   Image     Comment:   External Document

## 2010-11-11 NOTE — Miscellaneous (Signed)
Summary: Pulmonary-Referral Form  Pulmonary-Referral Form   Imported By: Dorice Lamas 02/07/2007 17:27:06  _____________________________________________________________________  External Attachment:    Type:   Image     Comment:   External Document

## 2010-11-11 NOTE — Assessment & Plan Note (Signed)
Summary: F/U,MC   Vital Signs:  Patient profile:   65 year old female BP sitting:   164 / 79  Vitals Entered By: Lillia Pauls CMA (April 25, 2010 10:04 AM)  Primary Care Provider:  Nelda Bucks DO   History of Present Illness:  f/u left wrist pain 99% better. Essentially no pain. Worse her splint like I asked her to.  Current Medications (verified): 1)  Aspirin 81 Mg Tbec (Aspirin) .... Take 1 Tablet By Mouth Once A Day 2)  Lasix 80 Mg Tabs (Furosemide) .... Take 1/2  Tablet By Mouth Once A Day 3)  Paxil 30 Mg Tabs (Paroxetine Hcl) .... Take 1 Tablet By Mouth Once A Day. 4)  Lotensin 40 Mg Tabs (Benazepril Hcl) .... Take 1 Tablet By Mouth Once A Day 5)  Glucophage 1000 Mg Tabs (Metformin Hcl) .... Take 1 Tablet By Mouth Two Times A Day 6)  Glucotrol 10 Mg Tabs (Glipizide) .... Take 2 Tablets By Mouth Two Times A Day 7)  Nitroquick  Subl (Nitroglycerin Subl) .... Use As Directed 8)  Imdur 30 Mg Tb24 (Isosorbide Mononitrate) .... Take 1 Tablet By Mouth Once A Day 9)  Pravachol 80 Mg Tabs (Pravastatin Sodium) .... Take 1 Tablet By Mouth Once A Day 10)  Ferrous Sulfate 325 (65 Fe) Mg Tabs (Ferrous Sulfate) .... Take 1 Tablet By Mouth Two Times A Day 11)  Atenolol 100 Mg Tabs (Atenolol) .... Take 1/2 Pill Daily By Mouth. 12)  Tylenol 325 Mg Tabs (Acetaminophen) .... Two Tabs Every Eight Hours As Needed Forpain. 13)  Calcium Carbonate 600 Mg Tabs (Calcium Carbonate) .... Take 1 Tablet By Mouth Two Times A Day 14)  Tylenol With Codeine #3 300-30 Mg Tabs (Acetaminophen-Codeine) .... Take 1 Tablet By Mouth Two Times A Day As Needed For Pain 15)  Cobal-1000 1000 Mcg/ml Soln (Cyanocobalamin) .... Inject 1 Ml Once Monthly. 16)  Vitamin D3 1000 Unit Tabs (Cholecalciferol) .... Take 1 Tablet By Mouth Once A Day 17)  Mag-Delay 535 (64 Mg) Mg Cr-Tabs (Magnesium Chloride) .... Take 1 Tablet By Mouth Two Times A Day 18)  Voltaren 1 % Gel (Diclofenac Sodium) .... Apply Small Amount To Affected Area  Up To 4 Times A Day. 19)  Syringe 23g X 3/4" 3 Ml Misc (Syringe/needle (Disp)) .... Use To Injection B12. 20)  Lantus Solostar 100 Unit/ml Soln (Insulin Glargine) .... Inject 5 Units At Bedtime. 21)  Naproxen 375 Mg Tabs (Naproxen) .[name].. 1 By Mouth Two Times A Day For 2 Weeks and Then Bod As Needed Hand Pain 22)  Prodigy Mini Pen Needles 31g X 5 Mm Misc (Insulin Pen Needle) .... Use To Inject Lantus Insulin One Time Each Day  Allergies: No Known Drug Allergies  Review of Systems  The patient denies fever.    Physical Exam  General:  alert, well-developed, well-nourished, well-hydrated, and overweight-appearing.   Msk:  B wrists full extension and flexion. Left wrist non tender to palpation over thumb tendons. 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 Wear splint if it flairs up again. rtc prn  Complete Medication List: 1)  Aspirin 81 Mg Tbec (Aspirin) .... Take 1 tablet by mouth once a day 2)  Lasix 80 Mg Tabs (Furosemide) .... Take 1/2  tablet by mouth once a day 3)  Paxil 30 Mg Tabs (Paroxetine hcl) .... Take 1 tablet by mouth once a day. 4)  Lotensin 40 Mg  Tabs (Benazepril hcl) .... Take 1 tablet by mouth once a day 5)  Glucophage 1000 Mg Tabs (Metformin hcl) .... Take 1 tablet by mouth two times a day 6)  Glucotrol 10 Mg Tabs (Glipizide) .... Take 2 tablets by mouth two times a day 7)  Nitroquick Subl (Nitroglycerin subl) .... Use as directed 8)  Imdur 30 Mg Tb24 (Isosorbide mononitrate) .... Take 1 tablet by mouth once a day 9)  Pravachol 80 Mg Tabs (Pravastatin sodium) .... Take 1 tablet by mouth once a day 10)  Ferrous Sulfate 325 (65 Fe) Mg Tabs (Ferrous sulfate) .... Take 1 tablet by mouth two times a day 11)  Atenolol 100 Mg Tabs (Atenolol) .... Take 1/2 pill daily by mouth. 12)  Tylenol 325 Mg Tabs (Acetaminophen) .... Two tabs every eight hours as needed  forpain. 13)  Calcium Carbonate 600 Mg Tabs (Calcium carbonate) .... Take 1 tablet by mouth two times a day 14)  Tylenol With Codeine #3 300-30 Mg Tabs (Acetaminophen-codeine) .... Take 1 tablet by mouth two times a day as needed for pain 15)  Cobal-1000 1000 Mcg/ml Soln (Cyanocobalamin) .... Inject 1 ml once monthly. 16)  Vitamin D3 1000 Unit Tabs (Cholecalciferol) .... Take 1 tablet by mouth once a day 17)  Mag-delay 535 (64 Mg) Mg Cr-tabs (Magnesium chloride) .... Take 1 tablet by mouth two times a day 18)  Voltaren 1 % Gel (Diclofenac sodium) .... Apply small amount to affected area up to 4 times a day. 19)  Syringe 23g X 3/4" 3 Ml Misc (Syringe/needle (disp)) .... Use to injection b12. 20)  Lantus Solostar 100 Unit/ml Soln (Insulin glargine) .... Inject 5 units at bedtime. 21)  Naproxen 375 Mg Tabs (Naproxen) .[name].. 1 by mouth two times a day for 2 weeks and then bod as needed hand pain 22)  Prodigy Mini Pen Needles 31g X 5 Mm Misc (Insulin pen needle) .... Use to inject lantus insulin one time each day

## 2010-11-11 NOTE — Assessment & Plan Note (Signed)
Summary: est-ck/fu/meds/cfb   Vital Signs:  Patient profile:   65 year old female Height:      64 inches (162.56 cm) Weight:      230.3 pounds (104.68 kg) BMI:     39.67 Temp:     98.0 degrees F (36.67 degrees C) oral Pulse rate:   64 / minute BP sitting:   158 / 76  (left arm) Cuff size:   large  Vitals Entered By: Cynda Familia Duncan Dull) (August 15, 2010 2:36 PM) Is Patient Diabetic? Yes Did you bring your meter with you today? Yes Pain Assessment Patient in pain? no      Nutritional Status BMI of > 30 = obese CBG Result 70  Have you ever been in a relationship where you felt threatened, hurt or afraid?No   Does patient need assistance? Functional Status Self care Ambulation Impaired:Risk for fall Comments uses a cane    Primary Care Provider:  Nelda Bucks DO   History of Present Illness:  Pt is a 65 y/o F with PMH outlined in the EMR who presents today for routine f/u of her chronic medical conditions:  1. DM2: pt is doing well with her regimen.  Taking meds as directed. Her meter reveals CBGs averaging aroung 120-170; lowest observed was a single reading of 81, highest reading of 192.  Pt is acheiving excellent blood sugar control.    2. HTN: pt has not checked her BP since her last visit.  Reports taking all meds as directed, but states she has not received any refills of her Lasix for months.  She denies visual changes, dizziness, synope and focal neurological deficit.  3. CAD: follows with Dr. Riley Kill.  Taking meds as directed.  Denies C/P, SOB, and DOE  4. GERD: stable  5.  New c/o HA: pt reports intermittent, sharp headaches of  ~18min duration that alternate between her right and left temporal regions.  She denies any visual changes, syncope but notes that massaging her temples causes mild discomfort.  Pt does not have any other concerns or complaints.  Denies fever, chills, abdominal pain, fatigue, and NVD.   Current Medications (verified): 1)   Aspirin 81 Mg Tbec (Aspirin) .... Take 1 Tablet By Mouth Once A Day 2)  Furosemide 40 Mg Tabs (Furosemide) .... Take 1 Tablet By Mouth Once A Day 3)  Paxil 30 Mg Tabs (Paroxetine Hcl) .... Take 1 Tablet By Mouth Once A Day. 4)  Lotensin 40 Mg Tabs (Benazepril Hcl) .... Take 1 Tablet By Mouth Once A Day 5)  Glucophage 1000 Mg Tabs (Metformin Hcl) .... Take 1 Tablet By Mouth Two Times A Day 6)  Glucotrol 10 Mg Tabs (Glipizide) .... Take 2 Tablets By Mouth Two Times A Day 7)  Nitroquick  Subl (Nitroglycerin Subl) .... Use As Directed 8)  Imdur 30 Mg Tb24 (Isosorbide Mononitrate) .... Take 1 Tablet By Mouth Once A Day 9)  Pravachol 80 Mg Tabs (Pravastatin Sodium) .... Take 1 Tablet By Mouth Once A Day 10)  Ferrous Sulfate 325 (65 Fe) Mg Tabs (Ferrous Sulfate) .... Take 1 Tablet By Mouth Two Times A Day 11)  Atenolol 100 Mg Tabs (Atenolol) .... Take 1/2 Pill Daily By Mouth. 12)  Tylenol 325 Mg Tabs (Acetaminophen) .... Two Tabs Every Eight Hours As Needed Forpain. 13)  Calcium Carbonate 600 Mg Tabs (Calcium Carbonate) .... Take 1 Tablet By Mouth Two Times A Day 14)  Tylenol With Codeine #3 300-30 Mg Tabs (Acetaminophen-Codeine) .... Take 1  Tablet By Mouth Two Times A Day As Needed For Pain 15)  Cobal-1000 1000 Mcg/ml Soln (Cyanocobalamin) .... Inject 1 Ml Once Monthly. 16)  Vitamin D3 1000 Unit Tabs (Cholecalciferol) .... Take 1 Tablet By Mouth Once A Day 17)  Mag-Delay 535 (64 Mg) Mg Cr-Tabs (Magnesium Chloride) .... Take 1 Tablet By Mouth Two Times A Day 18)  Voltaren 1 % Gel (Diclofenac Sodium) .... Apply Small Amount To Affected Area Up To 4 Times A Day. 19)  Syringe 23g X 3/4" 3 Ml Misc (Syringe/needle (Disp)) .... Use To Injection B12. 20)  Lantus Solostar 100 Unit/ml Soln (Insulin Glargine) .... Inject 5 Units At Bedtime. 21)  Naproxen 375 Mg Tabs (Naproxen) .[name].. 1 By Mouth Two Times A Day For 2 Weeks and Then Bod As Needed Hand Pain 22)  Prodigy Mini Pen Needles 31g X 5 Mm Misc (Insulin Pen  Needle) .... Use To Inject Lantus Insulin One Time Each Day  Allergies (verified): No Known Drug Allergies  Past History:  Past medical, surgical, family and social histories (including risk factors) reviewed, and no changes noted (except as noted below).  Past Medical History: Reviewed history from 02/06/2010 and no changes required. Current Problems:  CAD (ICD-414.00) HYPERTENSION (ICD-401.9) DYSLIPIDEMIA (ICD-272.4) DYSPNEA ON EXERTION (ICD-786.09) LEG EDEMA (ICD-782.3) NECK PAIN (ICD-723.1) OBESITY (ICD-278.00) GERD (ICD-530.81) DIABETES MELLITUS, TYPE II (ICD-250.00) SPINAL STENOSIS, LUMBAR (ICD-724.02) DEPRESSION (ICD-311) GLAUCOMA NOS (ICD-365.9) HEMORRHOIDS (ICD-455.6) HYPOMAGNESEMIA (ICD-275.2) DIVERTICULOSIS OF COLON (ICD-562.10) OSTEOPENIA (ICD-733.90) HEALTH SCREENING (ICD-V70.0) ARM PAIN, RIGHT (ICD-729.5) OTHER SCREENING MAMMOGRAM (ICD-V76.12) HYPOKALEMIA (ICD-276.8) ANEMIA, IRON DEFICIENCY (ICD-280.9) DEGENERATIVE JOINT DISEASE (ICD-715.90) B12 def- B12 level 213 on 03/11  Past Surgical History: Reviewed history from 08/02/2009 and no changes required. Posterior lumbar interfusion surgery at L2-L3 (Dr. Marikay Alar), 07/18/07 Lumbar fusion surgery at L3-L5 (s/p hardware removal in 10/08 on re-exploration), c. 2006 Rotator cuff repair, date unknown  History of shoulder surgery. Cholecystectomy.  endometrial biopsy.  Family History: Reviewed history from 08/02/2009 and no changes required. Family = 3 brothers, 9 sisters (1 deceased).   Pt's father and sister developed type II DM.   No MIs or strokes in pt's family of which pt is aware. No h/o cancer in family. Pt's 2 sisters have HTN and HL.  Social History: Reviewed history from 08/02/2009 and no changes required. 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 Single.   Never used tobacco. No alcohol or illicit drug use. Does exercise regularly. Walks 3 times/wk. Goes  to exercise at [facility]  Physical Exam  General:  alert, well-developed, well-nourished, well-hydrated, and overweight-appearing.   Head:  normocephalic and atraumatic.  no abnormalities observed and no abnormalities palpated.  Mild TTP of temporal areas. Eyes:  wearing glasses, sclera are anicteric. PERRL.   Mouth:  pharynx pink and moist, no erythema, and no exudates.   Neck:  no JVD, no LAD.  Lungs:  Normal respiratory effort, chest expands symmetrically. Lungs are clear to auscultation, no crackles or wheezes. Heart:  Normal rate and regular rhythm, no m/r/g. Abdomen:  obese, soft, NT, +BS's, ND.  Msk:  normal ROM, no joint tenderness, no joint swelling, no joint warmth, and no joint deformities.   Pulses:  Radial pulses normal bilaterally.  Extremities:  +1 pitting edema in bilateral lower extremities. Neurologic:  alert & oriented X3, cranial nerves II-XII intact, strength normal in all extremities, and sensation intact to light touch.  Uses cane to walk. Skin:  turgor normal and color normal.  Psych:  normally interactive, good eye contact, not anxious appearing, and not depressed appearing.     Impression & Recommendations:  Problem # 1:  HEADACHE, TEMPORAL (ICD-784.0) Pts sx most likely reflect tension h/a; no aura or other features c/w migraine and no eye tearing or othter symptoms of cluster h/a.  No abnormalities were noted on exam to suggest temporal arteritis, however this is still a possible diagnosis.  Will check CRP and ESR today for further evaluation, if positive will discuss temporal a bx with pt.   Her updated medication list for this problem includes:    Aspirin 81 Mg Tbec (Aspirin) .[name]... Take 1 tablet by mouth once a day    Atenolol 100 Mg Tabs (Atenolol) .[name]... Take 1/2 pill daily by mouth.    Tylenol 325 Mg Tabs (Acetaminophen) .[name]..[name] Two tabs every eight hours as needed forpain.    Tylenol With Codeine #3 300-30 Mg Tabs (Acetaminophen-codeine) .[name]... Take 1 tablet by  mouth two times a day as needed for pain    Naproxen 375 Mg Tabs (Naproxen) .[name]... 1 by mouth two times a day for 2 weeks and then bod as needed hand pain  Orders: T-Sed Rate (Automated) (16109-60454) T-CRP (C-Reactive Protein) (09811)  Problem # 2:  HYPERTENSION (ICD-401.9) Pts BP elevated today.  Initial reading revealed SBP of 171, repeat reading is improved but still above goal.  Pt has not been taking Lasix for months. Review of the EMR does not indicate this medicine was discontinued by [facility], cards, or other provider.   Will resume lasix and bring pt back in 1-2 weeks for BP check and BMET.  This should also help her lower extremity edema.   Will check CMET today.  Her updated medication list for this problem includes:    Furosemide 40 Mg Tabs (Furosemide) .[name]... Take 1 tablet by mouth once a day    Lotensin 40 Mg Tabs (Benazepril hcl) .[name]... Take 1 tablet by mouth once a day    Atenolol 100 Mg Tabs (Atenolol) .[name]... Take 1/2 pill daily by mouth.  Orders: T-CMP with Estimated GFR ([address])Future Orders: T-Basic Metabolic Panel [phone]) ... 08/29/2010  BP today: 158/76 Prior BP: 164/79 (04/25/2010)  Labs Reviewed: K+: 4.4 (10/15/2009) Creat: : 0.68 (10/15/2009)   Chol: 140 (10/15/2009)   HDL: 51 (10/15/2009)   LDL: 78 (10/15/2009)   TG: 55 (10/15/2009)  Problem # 3:  DIABETES MELLITUS, TYPE II (ICD-250.00)  Pt continues to maintain excellent control of her DM.   Pt is UTD with eye exam and foot exam.  Will bring her back in 2-52months for review of CBGs and check of A1c.  If her A1c remains at goal, will consider d/c of glucotrol.  Her updated medication list for this problem includes:    Aspirin 81 Mg Tbec (Aspirin) .[name]... Take 1 tablet by mouth once a day    Lotensin 40 Mg Tabs (Benazepril hcl) .[name]... Take 1 tablet by mouth once a day    Glucophage 1000 Mg Tabs (Metformin hcl) .[name]... Take 1 tablet by mouth two times a day    Glucotrol 10 Mg Tabs (Glipizide) .[name]... Take 2  tablets by mouth two times a day    Lantus Solostar 100 Unit/ml Soln (Insulin glargine) ..... Inject 5 units at bedtime.  Orders: T- Capillary Blood Glucose (82948) T-Hgb A1C (in-house) (13086VH)  Labs Reviewed: Creat: 0.68 (10/15/2009)     Last Eye Exam: No diabetic retinopathy.    (01/08/2010) Reviewed HgBA1c results: 7.5 (08/15/2010)  8.2 (  03/12/2010)  Problem # 4:  DYSLIPIDEMIA (ICD-272.4)  Pt is tolerating her statin well.  Will check LFTs today.  Will repeat FLP at next visit.  Her updated medication list for this problem includes:    Pravachol 80 Mg Tabs (Pravastatin sodium) .[name]... Take 1 tablet by mouth once a day  Orders: T-CMP with Estimated GFR ([address])  Labs Reviewed: SGOT: 12 (10/15/2009)   SGPT: 8 (10/15/2009)   HDL:51 (10/15/2009), 61 (12/27/2008)  LDL:78 (10/15/2009), 65 (12/27/2008)  Chol:140 (10/15/2009), 138 (12/27/2008)  Trig:55 (10/15/2009), 59 (12/27/2008)  Problem # 5:  PREVENTIVE HEALTH CARE (ICD-V70.0) Will give annual flu vax today as well as tetanus booster.  Will request 2011 mammogram report.  Complete Medication List: 1)  Aspirin 81 Mg Tbec (Aspirin) .... Take 1 tablet by mouth once a day 2)  Furosemide 40 Mg Tabs (Furosemide) .... Take 1 tablet by mouth once a day 3)  Paxil 30 Mg Tabs (Paroxetine hcl) .... Take 1 tablet by mouth once a day. 4)  Lotensin 40 Mg Tabs (Benazepril hcl) .... Take 1 tablet by mouth once a day 5)  Glucophage 1000 Mg Tabs (Metformin hcl) .... Take 1 tablet by mouth two times a day 6)  Glucotrol 10 Mg Tabs (Glipizide) .... Take 2 tablets by mouth two times a day 7)  Nitroquick Subl (Nitroglycerin subl) .... Use as directed 8)  Imdur 30 Mg Tb24 (Isosorbide mononitrate) .... Take 1 tablet by mouth once a day 9)  Pravachol 80 Mg Tabs (Pravastatin sodium) .... Take 1 tablet by mouth once a day 10)  Ferrous Sulfate 325 (65 Fe) Mg Tabs (Ferrous sulfate) .... Take 1 tablet by mouth two times a day 11)  Atenolol 100 Mg Tabs  (Atenolol) .... Take 1/2 pill daily by mouth. 12)  Tylenol 325 Mg Tabs (Acetaminophen) .... Two tabs every eight hours as needed forpain. 13)  Calcium Carbonate 600 Mg Tabs (Calcium carbonate) .... Take 1 tablet by mouth two times a day 14)  Tylenol With Codeine #3 300-30 Mg Tabs (Acetaminophen-codeine) .... Take 1 tablet by mouth two times a day as needed for pain 15)  Cobal-1000 1000 Mcg/ml Soln (Cyanocobalamin) .... Inject 1 ml once monthly. 16)  Vitamin D3 1000 Unit Tabs (Cholecalciferol) .... Take 1 tablet by mouth once a day 17)  Mag-delay 535 (64 Mg) Mg Cr-tabs (Magnesium chloride) .... Take 1 tablet by mouth two times a day 18)  Voltaren 1 % Gel (Diclofenac sodium) .... Apply small amount to affected area up to 4 times a day. 19)  Syringe 23g X 3/4" 3 Ml Misc (Syringe/needle (disp)) .... Use to injection b12. 20)  Lantus Solostar 100 Unit/ml Soln (Insulin glargine) .... Inject 5 units at bedtime. 21)  Naproxen 375 Mg Tabs (Naproxen) .[name].. 1 by mouth two times a day for 2 weeks and then bod as needed hand pain 22)  Prodigy Mini Pen Needles 31g X 5 Mm Misc (Insulin pen needle) .... Use to inject lantus insulin one time each day  Other Orders: Flu Vaccine 23yrs + MEDICARE PATIENTS (W0981) Administration Flu vaccine - MCR (G0008) Tdap => 96yrs IM (19147) Admin 1st Vaccine (82956)  Patient Instructions: 1)  Please schedule a follow-up appointment in 1-2 weeks for lab work.  This is very important. 2)  A new prescription fo Lasix was sent to your pharmacy.  This is a fluid pill that will help your blood pressure. 3)  I will call you if any of your lab work is abnormal. 4)  Keep up the great work! 5)  Please schedule a follow-up appointment in 3 months with Dr. Arvilla Market. 6)  Hope your holidays are wonderful! Prescriptions: FUROSEMIDE 40 MG TABS (FUROSEMIDE) Take 1 tablet by mouth once a day  #30 x 3   Entered and Authorized by:   Nelda Bucks DO   Signed by:   Nelda Bucks DO on  08/15/2010   Method used:   Electronically to        [id number] (retail)       [address]       [place], [place]  [id number]       Ph: [phone]       Fax: [phone]   RxID:   [id number]    Orders Added: 1)  T- Capillary Blood Glucose [82948] 2)  T-Hgb A1C (in-house) [83036QW] 3)  T-Basic Metabolic Panel [[id number]] 4)  Flu Vaccine 89yrs + MEDICARE PATIENTS [Q2039] 5)  Administration Flu vaccine - MCR [G0008] 6)  Tdap => 61yrs IM [90715] 7)  Admin 1st Vaccine [90471] 8)  T-Sed Rate (Automated) [66440-34742] 9)  T-CRP (C-Reactive Protein) [23860] 10)  T-CMP with Estimated GFR [[address]] 11)  Est. Patient Level IV [59563]   Immunizations Administered:  Tetanus Vaccine:    Vaccine Type: Tdap    Site: right deltoid    Mfr: GlaxoSmithKline    Dose: 0.5 ml    Route: IM    Given by: Cynda Familia (AAMA)    Exp. Date: 07/31/2012    Lot #: [id number]    VIS given: 08/29/08 version given August 15, 2010.   Immunizations Administered:  Tetanus Vaccine:    Vaccine Type: Tdap    Site: right deltoid    Mfr: GlaxoSmithKline    Dose: 0.5 ml    Route: IM    Given by: Cynda Familia (AAMA)    Exp. Date: 07/31/2012    Lot #: [id number]    VIS given: 08/29/08 version given August 15, 2010. Process Orders Check Orders Results:     [facility]: Check successful Tests Sent for requisitioning (August 16, 2010 1:01 PM):     08/29/2010: [facility] -- T-Basic Metabolic Panel [phone] (signed)     08/15/2010: [facility] -- T-Sed Rate (Automated) [[id number]] (signed)     08/15/2010: [facility] -- T-CRP (C-Reactive Protein) [23860] (signed)     08/15/2010: [facility] -- T-CMP with Estimated GFR [[id number]] (signed)     Prevention & Chronic Care Immunizations   Influenza vaccine: Fluvax 3+  (08/15/2010)   Influenza vaccine deferral:  Deferred  (12/24/2009)    Tetanus booster: 08/15/2010: Tdap    Pneumococcal vaccine: Pneumovax  (02/06/2010)    H. zoster vaccine: Not documented   H. zoster vaccine deferral: Refused  (03/12/2010)  Colorectal Screening   Hemoccult: Not documented   Hemoccult action/deferral: Deferred  (08/15/2010)    Colonoscopy:  Results: Hemorrhoids.       (01/14/2009)   Colonoscopy action/deferral: Repeat colonoscopy in 10 years.    (01/14/2009)   Colonoscopy due: 01/2019  Other Screening   Pap smear: Not documented   Pap smear action/deferral: Not indicated-other  (03/12/2010)    Mammogram: ASSESSMENT: Negative - BI-RADS 1^MM DIGITAL SCREENING  (06/05/2009)   Mammogram action/deferral: Ordered  (04/18/2010)   Mammogram due: 06/2009    DXA bone density scan: L femoral neck  t score -1.5 c/w osteopenia   (06/04/2008)   DXA scan due: 06/2010   Reports  requested:   Last mammogram report requested.  Smoking status: never  (04/18/2010)  Diabetes Mellitus   HgbA1C: 7.5  (08/15/2010)   Hemoglobin A1C due: 07/11/2010    Eye exam: No diabetic retinopathy.     (01/08/2010)   Eye exam due: 01/2011    Foot exam: yes  (04/18/2010)   Foot exam action/deferral: Do today   High risk foot: no  (04/18/2010)   Foot care education: Done  (04/18/2010)    Urine microalbumin/creatinine ratio: 5.9  (07/25/2009)   Urine microalbumin action/deferral: Ordered    Diabetes flowsheet reviewed?: Yes   Progress toward A1C goal: Improved  Lipids   Total Cholesterol: 140  (10/15/2009)   LDL: 78  (10/15/2009)   LDL Direct: Not documented   HDL: 51  (10/15/2009)   Triglycerides: 55  (10/15/2009)    SGOT (AST): 12  (10/15/2009)   SGPT (ALT): 8  (10/15/2009)   Alkaline phosphatase: 76  (10/15/2009)   Total bilirubin: 0.5  (10/15/2009)    Lipid flowsheet reviewed?: Yes   Progress toward LDL goal: At goal  Hypertension   Last Blood Pressure: 158 / 76  (08/15/2010)   Serum creatinine: 0.68   (10/15/2009)   Serum potassium 4.4  (10/15/2009)  Self-Management Support :   Personal Goals (by the next clinic visit) :     Personal A1C goal: 7  (07/25/2009)     Personal blood pressure goal: 130/80  (07/25/2009)     Personal LDL goal: 100  (07/25/2009)    Patient will work on the following items until the next clinic visit to reach self-care goals:     Medications and monitoring: take my medicines every day  (08/15/2010)     Eating: eat more vegetables, use fresh or frozen vegetables, eat foods that are low in salt, eat baked foods instead of fried foods, eat fruit for snacks and desserts  (04/18/2010)     Activity: take a 30 minute walk every day  (03/12/2010)    Diabetes self-management support: Written self-care plan  (08/15/2010)   Diabetes care plan printed   Last diabetes self-management training by diabetes educator: 03/26/2010    Hypertension self-management support: Written self-care plan  (08/15/2010)   Hypertension self-care plan printed.    Lipid self-management support: Written self-care plan  (08/15/2010)   Lipid self-care plan printed.   Nursing Instructions: Give Flu vaccine today Give tetanus booster today Request report of last mammogram at [facility] Diabetic foot exam today    Laboratory Results   Blood Tests   Date/Time Received: August 15, 2010 2:56 PM  Date/Time Reported: Burke Keels  August 15, 2010 2:56 PM   HGBA1C: 7.5%   (Normal Range: Non-Diabetic - 3-6%   Control Diabetic - 6-8%) CBG Random:: 70mg /dL    Flu Vaccine Consent Questions     Do you have a history of severe allergic reactions to this vaccine? no    Any prior history of allergic reactions to egg and/or gelatin? no    Do you have a sensitivity to the preservative Thimersol? no    Do you have a past history of Guillan-Barre Syndrome? no    Do you currently have an acute febrile illness? no    Have you ever had a severe reaction to latex? no    Vaccine  information given and explained to patient? yes    Are you currently pregnant? no    Lot Number:[id number]   Exp Date:04/11/2011   Manufacturer: [employer]  Site Given  Left Deltoid IM.Cynda Familia [facility])  August 15, 2010 4:47 PM  Date/Time Received: August 15, 2010 2:56 PM  Date/Time Reported: Burke Keels  August 15, 2010 2:56 PM   HGBA1C: 7.5%   (Normal Range: Non-Diabetic - 3-6%   Control Diabetic - 6-8%) CBG Random:: 70mg /dL      .medflu

## 2010-11-11 NOTE — Assessment & Plan Note (Signed)
Summary: [name] WITH DIABETES PER [name]/[facility])   Vital Signs:  Patient profile:   65 year old female Height:      64 inches (162.56 cm) Weight:      233.7 pounds (106.23 kg) BMI:     40.26 Temp:     96.7 degrees F (35.94 degrees C) oral Pulse rate:   65 / minute BP sitting:   142 / 62  (right arm)  Vitals Entered By: Stanton Kidney Ditzler RN (June 06, 2009 10:43 AM) Is Patient Diabetic? Yes  Pain Assessment Patient in pain? yes     Location: left wrist and neck Intensity: 9 Onset of pain  past 3-4 months Nutritional Status BMI of > 30 = obese Nutritional Status Detail appetite good CBG Result 109  Have you ever been in a relationship where you felt threatened, hurt or afraid?denies   Does patient need assistance? Functional Status Self care Ambulation Impaired:Risk for fall Comments Uses a cane. CBG too low and high. Left wrist hurting past month and neck hurts past 3-4 months.   Primary Care Provider:  Joaquin Courts  MD   History of Present Illness: [name] [name] is a 65 year old Female with PMH/problems as outlined in the EMR, who presents to the [facility] for routine follow up. She wants to talk about her DM, blood sugar is going up and down. Range is 58 - 210. It was 58 - 60 last month. Lately it is much better. She doesn't have any new concerns. Doing little bit of walking everyday. [facility] and recently got mammogram done too.   Depression History:      The patient denies a depressed mood most of the day and a diminished interest in her usual daily activities.         Preventive Screening-Counseling & Management  Alcohol-Tobacco     Alcohol drinks/day: 0     Smoking Status: never  Caffeine-Diet-Exercise     Does Patient Exercise: yes     Type of exercise: walking     Times/week: one day  Medications Prior to Update: 1)  Aspirin 81 Mg Tbec (Aspirin) .... Take 1 Tablet By Mouth Once A Day 2)  Lasix 80 Mg Tabs (Furosemide) .... Take  1/2  Tablet By Mouth Once A Day 3)  Paxil 30 Mg Tabs (Paroxetine Hcl) .... Take 2 Tablets By Mouth Once A Day 4)  Lotensin 40 Mg Tabs (Benazepril Hcl) .... Take 1 Tablet By Mouth Once A Day 5)  Glucophage 1000 Mg Tabs (Metformin Hcl) .... Take 1 Tablet By Mouth Two Times A Day 6)  Glucotrol 10 Mg Tabs (Glipizide) .... Take 1 Tablet By Mouth Two Times A Day 7)  Nitroquick  Subl (Nitroglycerin Subl) .... Use As Directed 8)  Imdur 30 Mg Tb24 (Isosorbide Mononitrate) .... Take 1 Tablet By Mouth Once A Day 9)  Pepcid 20 Mg Tabs (Famotidine) .... Take 1 Tablet By Mouth Two Times A Day 10)  Pravachol 80 Mg Tabs (Pravastatin Sodium) .... Take 1 Tablet By Mouth Once A Day 11)  Ferrous Sulfate 325 (65 Fe) Mg Tabs (Ferrous Sulfate) .... Take 1 Tablet By Mouth Two Times A Day 12)  Atenolol 100 Mg Tabs (Atenolol) .... Take 1/2 Pill Daily By Mouth. 13)  Tylenol Ex St Arthritis Pain 500 Mg Tabs (Acetaminophen) .... Two Tabs Every Eight Hours As Needed For Pain. 14)  Oscal 500/200 D-3 500-200 Mg-Unit Tabs (Calcium-Vitamin D) .... Take 1 Tablet By Mouth Three  Times A Day 15)  Mag-Ox 400 400 Mg Tabs (Magnesium Oxide) .... Take 1 Tablet By Mouth Two Times A Day  Allergies: No Known Drug Allergies  Past History:  Past Medical History: Last updated: 08/26/2006 Current Problems:  DYSLIPIDEMIA (ICD-272.4) OBESITY (ICD-278.00) CAD (ICD-414.00) DIABETES MELLITUS, TYPE II (ICD-250.00) HYPERTENSION (ICD-401.9) DYSPNEA ON EXERTION (ICD-786.09) ANEMIA, IRON DEFICIENCY (ICD-280.9) POSTMENOPAUSAL BLEEDING (ICD-627.1) DEGENERATIVE JOINT DISEASE (ICD-715.90) SPINAL STENOSIS, LUMBAR (ICD-724.02) GERD (ICD-530.81) DEPRESSION (ICD-311) GLAUCOMA NOS (ICD-365.9)  Past Surgical History: Last updated: 10/19/2007 Posterior lumbar interfusion surgery at L2-L3 (Dr. Marikay Alar), 07/18/07 Lumbar fusion surgery at L3-L5 (s/p hardware removal in 10/08 on re-exploration), c. 2006 Rotator cuff repair, date unknown   Family History: Last updated: 05/03/2007 Family = 3 brothers, 9 sisters (1 deceased).   Pt's father and sister developed type II DM.   No MIs or strokes in pt's family of which pt is aware. No h/o cancer in family. Pt's 2 sisters have HTN and HL.  Social History: Last updated: 05/03/2007 Pt doesn't work currently, but used to work at [employer] as a Production assistant, radio. Single.   Never used tobacco. No alcohol or illicit drug use. Does not exercise regularly.  Risk Factors: Alcohol Use: 0 (06/06/2009) Exercise: yes (06/06/2009)  Risk Factors: Smoking Status: never (06/06/2009)  Review of Systems       Patient denies fever, weight loss, fatigue, weakness/numbness, headache, NVD, abdominal pain, hematemesis/melena, chest pain, SOB, palpitations, cough, joint pain, rashes.    Physical Exam  General:  alert, well-developed, and overweight-appearing.   Eyes:  no pallor or icterus  Mouth:  pharynx pink and moist.   Lungs:  normal respiratory effort and normal breath sounds.   Heart:  normal rate and regular rhythm.   Abdomen:  soft and non-tender.   Pulses:  normal peripheral pulses  Extremities:  no cyanosis, clubbing or edema  Neurologic:  non focal Psych:  normally interactive.     Impression & Recommendations:  Problem # 1:  DIABETES MELLITUS, TYPE II (ICD-250.00)  HGBA1C: 7.1 (04/18/2009 8:13:00 AM) Continue with the current regimen. I told her that she is to stop taking actos on account of hypoglycemia as previously noted . She is uptodate on her eye and foot exam. I will have her come for the healthy lifestyle class with Ms. Victory Dakin.   Her updated medication list for this problem includes:    Aspirin 81 Mg Tbec (Aspirin) .[name]... Take 1 tablet by mouth once a day    Lotensin 40 Mg Tabs (Benazepril hcl) .[name]... Take 1 tablet by mouth once a day    Glucophage 1000 Mg Tabs (Metformin hcl) .[name]... Take 1 tablet by mouth two times a day    Glucotrol 10 Mg Tabs (Glipizide) .[name]... Take 1  tablet by mouth two times a day  Orders: Capillary Blood Glucose/CBG (16109)  Problem # 2:  HYPERTENSION (ICD-401.9) Repeat BP manually 135/70. Will continue with the current regimen for now. Will check b-met today. If persistenly above 130, will make some changes / addition to her meds.  Her updated medication list for this problem includes:    Lasix 80 Mg Tabs (Furosemide) .[name]... Take 1/2  tablet by mouth once a day    Lotensin 40 Mg Tabs (Benazepril hcl) .[name]... Take 1 tablet by mouth once a day    Atenolol 100 Mg Tabs (Atenolol) .[name]... Take 1/2 pill daily by mouth.  Orders: T-Basic Metabolic Panel [phone])  Problem # 3:  DYSLIPIDEMIA (ICD-272.4) Continue with pravachol.   Her  updated medication list for this problem includes:    Pravachol 80 Mg Tabs (Pravastatin sodium) .[name]... Take 1 tablet by mouth once a day  Complete Medication List: 1)  Aspirin 81 Mg Tbec (Aspirin) .... Take 1 tablet by mouth once a day 2)  Lasix 80 Mg Tabs (Furosemide) .... Take 1/2  tablet by mouth once a day 3)  Paxil 30 Mg Tabs (Paroxetine hcl) .... Take 2 tablets by mouth once a day 4)  Lotensin 40 Mg Tabs (Benazepril hcl) .... Take 1 tablet by mouth once a day 5)  Glucophage 1000 Mg Tabs (Metformin hcl) .... Take 1 tablet by mouth two times a day 6)  Glucotrol 10 Mg Tabs (Glipizide) .... Take 1 tablet by mouth two times a day 7)  Nitroquick Subl (Nitroglycerin subl) .... Use as directed 8)  Imdur 30 Mg Tb24 (Isosorbide mononitrate) .... Take 1 tablet by mouth once a day 9)  Pepcid 20 Mg Tabs (Famotidine) .... Take 1 tablet by mouth two times a day 10)  Pravachol 80 Mg Tabs (Pravastatin sodium) .... Take 1 tablet by mouth once a day 11)  Ferrous Sulfate 325 (65 Fe) Mg Tabs (Ferrous sulfate) .... Take 1 tablet by mouth two times a day 12)  Atenolol 100 Mg Tabs (Atenolol) .... Take 1/2 pill daily by mouth. 13)  Tylenol Ex St Arthritis Pain 500 Mg Tabs (Acetaminophen) .... Two tabs every eight hours as  needed for pain. 14)  Oscal 500/200 D-3 500-200 Mg-unit Tabs (Calcium-vitamin d) .... Take 1 tablet by mouth three times a day 15)  Mag-ox 400 400 Mg Tabs (Magnesium oxide) .... Take 1 tablet by mouth two times a day  Patient Instructions: 1)  Please schedule a follow-up appointment in 3 months. 2)  Please sign up with Ms. Victory Dakin for HEALTHY LIFE STYLE CLASS.  3)  We will let you know if anything wrong with your lab work.  4)  Please STOP ACTOS (PIOGLITAZONE).   Prevention & Chronic Care Immunizations   Influenza vaccine: Fluvax 3+  (08/28/2008)    Tetanus booster: Not documented    Pneumococcal vaccine: Not documented    H. zoster vaccine: Not documented    Immunization comments: Tetnus shot in 2007  Colorectal Screening   Hemoccult: Not documented    Colonoscopy:  Results: Hemorrhoids.       (01/14/2009)   Colonoscopy action/deferral: Repeat colonoscopy in 10 years.    (01/14/2009)   Colonoscopy due: 01/2019  Other Screening   Pap smear: Not documented    Mammogram: No specific mammographic evidence of malignancy.  Assessment: BIRADS 1.   (06/04/2008)   Mammogram action/deferral: Screening mammogram in 1 year.     (06/04/2008)   Mammogram due: 06/2009    DXA bone density scan: L femoral neck  t score -1.5 c/w osteopenia   (06/04/2008)   DXA scan due: 06/2010    Smoking status: never  (06/06/2009)  Diabetes Mellitus   HgbA1C: 7.1  (04/18/2009)    Eye exam: No diabetic retinopathy.     (01/02/2009)   Eye exam due: 01/2010    Foot exam: yes  (12/27/2008)   High risk foot: Not documented   Foot care education: Not documented    Urine microalbumin/creatinine ratio: 5.5  (05/03/2008)    Diabetes flowsheet reviewed?: Yes   Progress toward A1C goal: Unchanged  Lipids   Total Cholesterol: 138  (12/27/2008)   LDL: 65  (12/27/2008)   LDL Direct: Not documented   HDL: 61  (12/27/2008)  Triglycerides: 59  (12/27/2008)    SGOT (AST): 12  (12/27/2008)    SGPT (ALT): 8  (12/27/2008)   Alkaline phosphatase: 83  (12/27/2008)   Total bilirubin: 0.4  (12/27/2008)    Lipid flowsheet reviewed?: Yes   Progress toward LDL goal: At goal  Hypertension   Last Blood Pressure: 142 / 62  (06/06/2009)   Serum creatinine: 0.53  (01/15/2009)   Serum potassium 4.3  (01/15/2009)    Hypertension flowsheet reviewed?: Yes   Progress toward BP goal: Unchanged  Self-Management Support :    Diabetes self-management support: Copy of home glucose meter record  (06/06/2009)   Last diabetes self-management training by diabetes educator: 04/18/2009    Hypertension self-management support: Not documented    Lipid self-management support: Not documented     Vital Signs:  Patient Profile:   65 year old female Height:     64 inches (162.56 cm) Weight:      233.7 pounds (106.23 kg) BMI:     40.26 Temp:     96.7 degrees F (35.94 degrees C) oral Pulse rate:   65 / minute BP sitting:   142 / 62    Location:   left wrist and neck    Intensity:   9             CBG Result 109      Process Orders Check Orders Results:     [facility]: ABN not required for this insurance Tests Sent for requisitioning (June 06, 2009 11:16 AM):     06/06/2009: [facility] -- T-Basic Metabolic Panel [phone] (signed)

## 2010-11-11 NOTE — Assessment & Plan Note (Signed)
Summary: check up [mkj]   Vital Signs:  Patient profile:   65 year old female Height:      64 inches (162.56 cm) Weight:      236.6 pounds (107.55 kg) BMI:     40.76 Temp:     97.4 degrees F (36.33 degrees C) oral Pulse rate:   68 / minute BP sitting:   128 / 61  (right arm) Cuff size:   regular  Vitals Entered By: Theotis Barrio NT II (December 27, 2008 2:27 PM) Is Patient Diabetic? Yes  Pain Assessment Patient in pain? yes     Location: R/SHOULDER Intensity:     6 Type: sharp Onset of pain  SINCE LAST VISIT TO CLINIC Nutritional Status BMI of > 30 = obese CBG Result 93  Have you ever been in a relationship where you felt threatened, hurt or afraid?No   Does patient need assistance? Functional Status Self care Ambulation Normal Comments WALKS WITH THE ASSIST OF A CANE  / RIGHT SIDE NECK RASH AND ON ABD /  MEDICATION REFILL  /  NEED RESULT OF X-RAY   Primary Care Provider:  Joaquin Courts  MD   History of Present Illness: Pt is a 65 yo female w/ past medical history below here for routine f/u.  She notes her R neck/shoulder is still hurting her.  She has been checking her sugars and they have been doing well, no lows or highs.  They run higher in the evening.  She notes that she broke out in a rash after she started taking magnesium about one month ago.  It was occasionally pruritic.  Then she stopped the magnesium and it has started to heal.  She hasn't changed any medicines or detergents.  She did change to ivory soap about the time it started to get better.  She is scheduled for a colonoscopy w/ Dr. Arlyce Dice on 01/14/09 and she is getting her diabetic eye exam next week.  She got her pap done at her gyn, Dr. Verdell Carmine office on 3/11 and it was normal. I will have those results scanned.    Preventive Screening-Counseling & Management     Smoking Status: never     Does Patient Exercise: yes     Type of exercise: walking     Times/week: one day  Current Medications  (verified): 1)  Aspirin 81 Mg Tbec (Aspirin) .... Take 1 Tablet By Mouth Once A Day 2)  Lasix 80 Mg Tabs (Furosemide) .... Take 1and 1/2  Tablets By Mouth Once A Day 3)  Paxil 30 Mg Tabs (Paroxetine Hcl) .... Take 2 Tablets By Mouth Once A Day 4)  Lotensin 40 Mg Tabs (Benazepril Hcl) .... Take 1 Tablet By Mouth Once A Day 5)  Glucophage 1000 Mg Tabs (Metformin Hcl) .... Take 1 Tablet By Mouth Two Times A Day 6)  Glucotrol 10 Mg Tabs (Glipizide) .... Take 1 Tablet By Mouth Two Times A Day 7)  Nitroquick  Subl (Nitroglycerin Subl) .... Use As Directed 8)  Imdur 30 Mg Tb24 (Isosorbide Mononitrate) .... Take 1 Tablet By Mouth Once A Day 9)  Pepcid 20 Mg Tabs (Famotidine) .... Take 1 Tablet By Mouth Two Times A Day 10)  Actos 30 Mg Tabs (Pioglitazone Hcl) .... Take 1 Tablet By Mouth Once A Day 11)  Pravachol 80 Mg Tabs (Pravastatin Sodium) .... Take 1 Tablet By Mouth Once A Day 12)  Ferrous Sulfate 325 (65 Fe) Mg Tabs (Ferrous Sulfate) .... Take 1 Tablet By  Mouth Two Times A Day 13)  Atenolol 100 Mg Tabs (Atenolol) .... Take 1/2 Pill Daily By Mouth. 14)  Tylenol Ex St Arthritis Pain 500 Mg Tabs (Acetaminophen) .... Two Tabs Every Eight Hours As Needed For Pain. 15)  Oscal 500/200 D-3 500-200 Mg-Unit Tabs (Calcium-Vitamin D) .... Take 1 Tablet By Mouth Three Times A Day 16)  Slow-Mag 535 (64 Mg) Mg Cr-Tabs (Magnesium Chloride) .... Take 1 Tablet By Mouth Two Times A Day  Allergies (verified): No Known Drug Allergies  Past History:  Past Medical History:    Current Problems:     DYSLIPIDEMIA (ICD-272.4)    OBESITY (ICD-278.00)    CAD (ICD-414.00)    DIABETES MELLITUS, TYPE II (ICD-250.00)    HYPERTENSION (ICD-401.9)    DYSPNEA ON EXERTION (ICD-786.09)    ANEMIA, IRON DEFICIENCY (ICD-280.9)    POSTMENOPAUSAL BLEEDING (ICD-627.1)    DEGENERATIVE JOINT DISEASE (ICD-715.90)    SPINAL STENOSIS, LUMBAR (ICD-724.02)    GERD (ICD-530.81)    DEPRESSION (ICD-311)    GLAUCOMA NOS (ICD-365.9)      (08/26/2006)  Past Surgical History:    Posterior lumbar interfusion surgery at L2-L3 (Dr. Marikay Alar), 07/18/07    Lumbar fusion surgery at L3-L5 (s/p hardware removal in 10/08 on re-exploration), c. 2006    Rotator cuff repair, date unknown (10/19/2007)  Family History:    Family = 3 brothers, 9 sisters (1 deceased).      Pt's father and sister developed type II DM.      No MIs or strokes in pt's family of which pt is aware.    No h/o cancer in family.    Pt's 2 sisters have HTN and HL. (05/03/2007)  Social History:    Pt doesn't work currently, but used to work at [employer] as a Production assistant, radio.    Single.      Never used tobacco.    No alcohol or illicit drug use.    Does not exercise regularly. (05/03/2007)  Family History:    Reviewed history from 05/03/2007 and no changes required:       Family = 3 brothers, 9 sisters (1 deceased).         Pt's father and sister developed type II DM.         No MIs or strokes in pt's family of which pt is aware.       No h/o cancer in family.       Pt's 2 sisters have HTN and HL.  Social History:    Reviewed history from 05/03/2007 and no changes required:       Pt doesn't work currently, but used to work at [employer] as a Production assistant, radio.       Single.         Never used tobacco.       No alcohol or illicit drug use.       Does not exercise regularly.  Review of Systems       As per HPI.  Physical Exam  General:  Alert and oriented, pleasant, ambulated w/ cane, no distress.  Eyes:  Anicteric, arcus senilus present bilaterally, PERRL, wearing glasses.  Mouth:  MMM, fair dentition.  Neck:  Paraspinal muscles tender to palpation.   Lungs:  normal respiratory effort and normal breath sounds.   Heart:  normal rate, regular rhythm, no murmur, no gallop, and no rub.   Abdomen:  obese, soft, non-tender, and normal bowel sounds.   Msk:  Decreased strength in R UE  limited by pain.  FROM of bilateral shoulders.  Reflexes symmetric.   Pulses:   normal DP pulses.   Extremities:  no edema, next to last toe on each foot significantly smaller than the others.  Toenails are dystrophic. Skin:  Rash noted on R neck.   Psych:  mood euthymic.    Diabetes Management Exam:    Foot Exam (with socks and/or shoes not present):       Sensory-Monofilament:          Left foot: normal          Right foot: normal   Impression & Recommendations:  Problem # 1:  NECK PAIN (ICD-723.1) Concerning for radiculopathy given symptoms.  Will get MRI to check for spinal stenosis, etc.  cont tylenol b/c would like to avoid NSAID's b/c of ACE inh and that she is a diabetic.  Plain film unrevealing for etiology.  Her updated medication list for this problem includes:    Aspirin 81 Mg Tbec (Aspirin) .[name]... Take 1 tablet by mouth once a day    Tylenol Ex St Arthritis Pain 500 Mg Tabs (Acetaminophen) .[name]..[name] Two tabs every eight hours as needed for pain.  Orders: MRI without Contrast (MRI w/o Contrast)  Problem # 2:  DIABETES MELLITUS, TYPE II (ICD-250.00) A1c up but hesitant on titrating meds up b/c she would like to try diet alone.  We discussed several dietary strategies to adjust w/ lunch  b/c highs are pre-dinner.  Will have her meet w/ Lupita Leash R to discuss dietary modifications.  Could go up on glucotrol but she would like to try diet first.  Diabetic foot exam today.  Set up w/ podiatrist b/c of dysmorphic toenails.  No urine microalbumin/creatinine ratio b/c already on ACE inh.  BP at goal.  Checking lipids, previously at goal.  Diabetic eye exam set up soon.  Her updated medication list for this problem includes:    Aspirin 81 Mg Tbec (Aspirin) .[name]... Take 1 tablet by mouth once a day    Lotensin 40 Mg Tabs (Benazepril hcl) .[name]... Take 1 tablet by mouth once a day    Glucophage 1000 Mg Tabs (Metformin hcl) .[name]... Take 1 tablet by mouth two times a day    Glucotrol 10 Mg Tabs (Glipizide) .[name]... Take 1 tablet by mouth two times a day    Actos 30 Mg Tabs  (Pioglitazone hcl) .[name]... Take 1 tablet by mouth once a day  Orders: Diabetic Clinic Referral (Diabetic) Podiatry Referral (Podiatry)  Problem # 3:  HYPERTENSION (ICD-401.9) BP at goal.  She is on high dose of lasix without hx of heart failure.  But, since this has been stable for a while, will follow cr/K today and if ok, will not make any changes.  Will consider cutting back lasix dose and titrating up atenolol dose.  Her updated medication list for this problem includes:    Lasix 80 Mg Tabs (Furosemide) .[name]... Take 1/2  tablet by mouth once a day    Lotensin 40 Mg Tabs (Benazepril hcl) .[name]... Take 1 tablet by mouth once a day    Atenolol 100 Mg Tabs (Atenolol) .[name]... Take 1/2 pill daily by mouth.  Problem # 4:  HYPOMAGNESEMIA (ICD-275.2) Likely secondary to lasix.  Stopped taking supplementation.  Has been on this for years and noted to be low when K was low.  Since she has stopped taking it, will check Mg today.    Problem # 5:  DYSLIPIDEMIA (ICD-272.4) Lipids/CMET today.  Goal  LDL < 70.  Her updated medication list for this problem includes:    Pravachol 80 Mg Tabs (Pravastatin sodium) .[name]... Take 1 tablet by mouth once a day  Orders: T-Lipid Profile (14782-95621) T-Comprehensive Metabolic Panel (30865-78469)  Problem # 6:  DERMATITIS (ICD-692.9) She has a rash that appears almost completley healed.  Unsure of the etiology but I suspect it was not the mg pill.  However, if Mg low, will change it to something else.  May be related to her changing soaps.  Since it is almost completely better, no need for any tx at this time.   Problem # 7:  HEALTH SCREENING (ICD-V70.0) Mammo birads-01 08-09, pap WNL by her gyn 3/10, dexa done 08/09 and scheduled for colonoscopy next month.    Problem # 8:  DEPRESSION (ICD-311) Mood euthymic.  Cont paxil.  Seems to be working.  Her updated medication list for this problem includes:    Paxil 30 Mg Tabs (Paroxetine hcl) .[name]... Take 2 tablets by mouth  once a day  Problem # 9:  OSTEOPENIA (ICD-733.90) Cont ca + Vit D.  Recheck scan 08/10-08/11.  Complete Medication List: 1)  Aspirin 81 Mg Tbec (Aspirin) .... Take 1 tablet by mouth once a day 2)  Lasix 80 Mg Tabs (Furosemide) .... Take 1/2  tablet by mouth once a day 3)  Paxil 30 Mg Tabs (Paroxetine hcl) .... Take 2 tablets by mouth once a day 4)  Lotensin 40 Mg Tabs (Benazepril hcl) .... Take 1 tablet by mouth once a day 5)  Glucophage 1000 Mg Tabs (Metformin hcl) .... Take 1 tablet by mouth two times a day 6)  Glucotrol 10 Mg Tabs (Glipizide) .... Take 1 tablet by mouth two times a day 7)  Nitroquick Subl (Nitroglycerin subl) .... Use as directed 8)  Imdur 30 Mg Tb24 (Isosorbide mononitrate) .... Take 1 tablet by mouth once a day 9)  Pepcid 20 Mg Tabs (Famotidine) .... Take 1 tablet by mouth two times a day 10)  Actos 30 Mg Tabs (Pioglitazone hcl) .... Take 1 tablet by mouth once a day 11)  Pravachol 80 Mg Tabs (Pravastatin sodium) .... Take 1 tablet by mouth once a day 12)  Ferrous Sulfate 325 (65 Fe) Mg Tabs (Ferrous sulfate) .... Take 1 tablet by mouth two times a day 13)  Atenolol 100 Mg Tabs (Atenolol) .... Take 1/2 pill daily by mouth. 14)  Tylenol Ex St Arthritis Pain 500 Mg Tabs (Acetaminophen) .... Two tabs every eight hours as needed for pain. 15)  Oscal 500/200 D-3 500-200 Mg-unit Tabs (Calcium-vitamin d) .... Take 1 tablet by mouth three times a day 16)  Slow-mag 535 (64 Mg) Mg Cr-tabs (Magnesium chloride) .... Take 1 tablet by mouth two times a day  Other Orders: T- Capillary Blood Glucose (62952) T-Hgb A1C (in-house) (84132GM) T-Magnesium ([id number])  Patient Instructions: 1)  Please make a followup appointment in 3 months for routine care. 2)  Please see Jamison Neighbor regarding your diabetes. 3)  Please try to make those changes we talked about with your diet. 4)  Please remember to get your colonoscopy, eye exam and mammogram done.  You are doing a great job making  your appointments! 5)  Please see the foot doctor regarding your toenails.  6)  You will be called with any abnormal labwork.  Please make sure your phone number is correct at the front desk. Prescriptions: SLOW-MAG 535 (64 MG) MG CR-TABS (MAGNESIUM CHLORIDE) Take 1 tablet by mouth two times  a day  #60 x 3   Entered and Authorized by:   Joaquin Courts  MD   Signed by:   Joaquin Courts  MD on 12/28/2008   Method used:   Electronically to        [id number] (retail)       [address]       [place], [place]  [address]       Ph: [phone] or [phone]       Fax: [phone]   RxID:   [id number] ATENOLOL 100 MG TABS (ATENOLOL) Take 1/2 pill daily by mouth.  #15 x 5   Entered and Authorized by:   Joaquin Courts  MD   Signed by:   Joaquin Courts  MD on 12/27/2008   Method used:   Electronically to        [id number] (retail)       [address]       [place], [place]  [address]       Ph: [phone] or [phone]       Fax: [phone]   RxID:   [id number] FERROUS SULFATE 325 (65 FE) MG TABS (FERROUS SULFATE) Take 1 tablet by mouth two times a day  #62 x 5   Entered and Authorized by:   Joaquin Courts  MD   Signed by:   Joaquin Courts  MD on 12/27/2008   Method used:   Electronically to        [facility]  [address] [id number]* (retail)       [address]       [place], [place]  [address]       Ph: [phone] or [phone]       Fax: [phone]   RxID:   [id number] PRAVACHOL 80 MG TABS (PRAVASTATIN SODIUM) Take 1 tablet by mouth once a day  #31 Tablet x 5   Entered and Authorized by:   Joaquin Courts  MD   Signed by:   Joaquin Courts  MD on 12/27/2008   Method used:   Electronically to        [facility]  [address] [facility] [id number]* (retail)       [address]       [place], [place]  [address]       Ph:  [phone] or [phone]       Fax: [phone]   RxID:   [id number] ACTOS 30 MG TABS (PIOGLITAZONE HCL) Take 1 tablet by mouth once a day  #30 x 5   Entered and Authorized by:   Joaquin Courts  MD   Signed by:   Joaquin Courts  MD on 12/27/2008   Method used:   Electronically to        [facility]  [address] [id number]* (retail)       [address]       [place], [place]  [address]       Ph: [phone] or [phone]       Fax: [phone]   RxID:   [id number] PEPCID 20 MG TABS (FAMOTIDINE) Take 1 tablet by mouth two times a day  #  60 x 5   Entered and Authorized by:   Joaquin Courts  MD   Signed by:   Joaquin Courts  MD on 12/27/2008   Method used:   Electronically to        [id number] (retail)       [address]       [place], [place]  [address]       Ph: [phone] or [phone]       Fax: [phone]   RxID:   [id number] IMDUR 30 MG TB24 (ISOSORBIDE MONONITRATE) Take 1 tablet by mouth once a day  #30 Tablet x 5   Entered and Authorized by:   Joaquin Courts  MD   Signed by:   Joaquin Courts  MD on 12/27/2008   Method used:   Electronically to        [facility]  [address] [address] [id number] (retail)       [address]       [place], [place]  [address]       Ph: [phone] or [phone]       Fax: [phone]   RxID:   [id number] GLUCOTROL 10 MG TABS (GLIPIZIDE) Take 1 tablet by mouth two times a day  #60 Tablet x 5   Entered and Authorized by:   Joaquin Courts  MD   Signed by:   Joaquin Courts  MD on 12/27/2008   Method used:   Electronically to        [id number] (retail)       [address]       [place], [place]  [address]       Ph: [phone] or [phone]       Fax: [phone]   RxID:   [id number] GLUCOPHAGE 1000 MG TABS (METFORMIN HCL) Take 1 tablet by mouth two times  a day  #62 x 5   Entered and Authorized by:   Joaquin Courts  MD   Signed by:   Joaquin Courts  MD on 12/27/2008   Method used:   Electronically to        [id number] (retail)       [address]       [place], [place]  [address]       Ph: [phone] or [phone]       Fax: [phone]   RxID:   [id number] LOTENSIN 40 MG TABS (BENAZEPRIL HCL) Take 1 tablet by mouth once a day  #31 x 5   Entered and Authorized by:   Joaquin Courts  MD   Signed by:   Joaquin Courts  MD on 12/27/2008   Method used:   Electronically to        [facility]  [address] [address] [id number] (retail)       [address]       [place], [place]  [address]       Ph: [phone] or [phone]       Fax: [phone]   RxID:   [id number] LASIX 80 MG TABS (FUROSEMIDE) Take 1and 1/2  tablets by mouth once a day  #  45 x 5   Entered and Authorized by:   Joaquin Courts  MD   Signed by:   Joaquin Courts  MD on 12/27/2008   Method used:   Electronically to        [id number] (retail)       [address]       [place], [place]  [address]       Ph: [phone] or [phone]       Fax: [phone]   RxID:   [id number]   Laboratory Results   Blood Tests   Date/Time Received: December 27, 2008 3:15 PM  Date/Time Reported: Oren Beckmann  December 27, 2008 3:15 PM   HGBA1C: 7.2%   (Normal Range: Non-Diabetic - 3-6%   Control Diabetic - 6-8%) CBG Random:: 93mg /dL    MLI_PICT  Sketch(1)   Last LDL:                                                 57 (05/03/2008 9:12:00 PM)        Diabetic Foot Exam Foot Inspection Is there a history of a foot ulcer?              No Is there a foot ulcer now?              No Can the patient see the bottom of their feet?          Yes Are the shoes appropriate in style and fit?          Yes Is there swelling or an abnormal foot shape?           Yes Are the toenails long?                No Are the toenails thick?                Yes Are the toenails ingrown?              No Is there heavy callous build-up?              Yes Is there a claw toe deformity?                          No Is there elevated skin temperature?            No Is there limited ankle dorsiflexion?            No Is there foot or ankle muscle weakness?            No Do you have pain in calf while walking?           Yes         10-g (5.07) Semmes-Weinstein Monofilament Test Performed by: Theotis Barrio NT II          Right Foot          Left Foot Visual Inspection     normal           normal Test Control      normal         normal Site 1         normal  normal Site 2         normal         normal Site 3         normal         normal Site 4         normal         normal Site 5         normal         normal Site 6         normal         normal Site 7         normal         normal Site 8         normal         normal Site 9         normal         normal Site 10         normal         normal  Impression      normal         normal

## 2010-11-11 NOTE — Progress Notes (Signed)
Summary: Refill/gh  Phone Note Refill Request Message from:  Pharmacy on August 26, 2007 1:54 PM  Ptwants refill on Atenolol 100 mg #30    Method Requested: Electronic Initial call taken by: Angelina Ok RN,  August 26, 2007 1:55 PM  Follow-up for Phone Call        Refill approved-nurse to complete Follow-up by: Ulyess Mort MD,  August 26, 2007 3:17 PM  Additional Follow-up for Phone Call Additional follow up Details #1::       Additional Follow-up by: Angelina Ok RN,  August 26, 2007 5:00 PM      Prescriptions: ATENOLOL 100 MG TABS (ATENOLOL) Take 1/2 pill daily by mouth.  #30 x 5   Entered and Authorized by:   Ulyess Mort MD   Signed by:   Ulyess Mort MD on 08/26/2007   Method used:   Electronically sent to ...       [id number]       [address]       [place], [place]  [address]       Ph: [phone] or [phone]       Fax: [phone]   RxID:   [id number]

## 2010-11-11 NOTE — Progress Notes (Signed)
Summary: change in Actos dose/dmr  Phone Note Outgoing Call   Call placed by: Jamison Neighbor RD,CDE,  February 05, 2009 3:18 PM Summary of Call: per physican patient to decrease Actos to 15 mg to alleviate lower daytime blood sugars.called patient today and she verbalized understanding to cut her Actos (white pill with a number "30" on it) in half and only take one half each day.  She also verbalized that she'd call us if blood sugar changed significantly  Follow-up for Phone Call        thank you! Follow-up by: Joaquin Courts  MD,  February 06, 2009 8:29 AM    New/Updated Medications: ACTOS 15 MG TABS (PIOGLITAZONE HCL) Take 1 tablet by mouth once a day   Prescriptions: ACTOS 15 MG TABS (PIOGLITAZONE HCL) Take 1 tablet by mouth once a day  #30 x 3   Entered and Authorized by:   Joaquin Courts  MD   Signed by:   Joaquin Courts  MD on 02/06/2009   Method used:   Electronically to        [facility]  [address] [address] [id number] (retail)       [address]       [place], [place]  [address]       Ph: [phone] or [phone]       Fax: [phone]   RxID:   [id number]

## 2010-11-11 NOTE — Assessment & Plan Note (Signed)
Summary: 11:00-F/U,MC   Vital Signs:  Patient profile:   65 year old female BP sitting:   159 / 77  Vitals Entered By: Lillia Pauls CMA (April 07, 2010 10:41 AM)  Primary Care Provider:  Joaquin Courts  MD   History of Present Illness: F/u left wrist pain--about 30% better. has not been wearing splint during day, just at night. has continued to be active during the day with household chores. Pain is in thumb tendon area, worse with certain movements of thumb, the brace helped it at night.  Allergies: No Known Drug Allergies  Review of Systems       see hpi  Physical Exam  General:  alert, well-developed, and overweight-appearing.   Msk:  left thumb pain with resisted abduction, extension. Non tender to palpation of thumb tendons. no warmth or erythema. 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 Take 1 tablet by mouth once a day 2)  Lasix 80 Mg Tabs (Furosemide) .... Take 1/2  tablet by mouth once a day 3)  Paxil 30 Mg Tabs (Paroxetine hcl) .... Take 1 tablet by mouth once a day. 4)  Lotensin 40 Mg Tabs (Benazepril hcl) .... Take 1 tablet by mouth once a day 5)  Glucophage 1000 Mg Tabs (Metformin hcl) .... Take 1 tablet by mouth two times a day 6)  Glucotrol 10 Mg Tabs (Glipizide) .... Take 2 tablets by mouth two times a day 7)  Nitroquick Subl (Nitroglycerin subl) .... Use as directed 8)  Imdur 30 Mg Tb24 (Isosorbide mononitrate) .... Take 1 tablet by mouth once a day 9)  Pravachol 80 Mg Tabs (Pravastatin sodium) .... Take 1 tablet by mouth once a day 10)  Ferrous Sulfate 325 (65 Fe) Mg Tabs (Ferrous sulfate) .... Take 1 tablet by mouth two times a day 11)  Atenolol 100 Mg Tabs (Atenolol) .... Take 1/2  pill daily by mouth. 12)  Tylenol 325 Mg Tabs (Acetaminophen) .... Two tabs every eight hours as needed forpain. 13)  Calcium Carbonate 600 Mg Tabs (Calcium carbonate) .... Take 1 tablet by mouth two times a day 14)  Tylenol With Codeine #3 300-30 Mg Tabs (Acetaminophen-codeine) .... Take 1 tablet by mouth two times a day as needed for pain 15)  Cobal-1000 1000 Mcg/ml Soln (Cyanocobalamin) .... Inject 1 ml once monthly. 16)  Vitamin D3 1000 Unit Tabs (Cholecalciferol) .... Take 1 tablet by mouth once a day 17)  Mag-delay 535 (64 Mg) Mg Cr-tabs (Magnesium chloride) .... Take 1 tablet by mouth two times a day 18)  Voltaren 1 % Gel (Diclofenac sodium) .... Apply small amount to affected area up to 4 times a day. 19)  Syringe 23g X 3/4" 3 Ml Misc (Syringe/needle (disp)) .... Use to injection b12. 20)  Lantus Solostar 100 Unit/ml Soln (Insulin glargine) .... Inject 5 units at bedtime. 21)  Naproxen 375 Mg Tabs (Naproxen) .[name].. 1 by mouth two times a day for 2 weeks and then bod as needed hand pain 22)  Prodigy Mini Pen Needles 31g X 5 Mm Misc (Insulin pen needle) .... Use to inject lantus insulin one time each day

## 2010-11-11 NOTE — Letter (Signed)
Summary: DOCTOR DIABETIC SUPPLY  DOCTOR DIABETIC SUPPLY   Imported By: Shon Hough 08/06/2010 11:38:46  _____________________________________________________________________  External Attachment:    Type:   Image     Comment:   External Document

## 2010-11-11 NOTE — Letter (Signed)
Summary: BLOOD GLUCOSE 03-18-2010-04-18-2010  BLOOD GLUCOSE 03-18-2010-04-18-2010   Imported By: Margie Billet 04/21/2010 15:37:23  _____________________________________________________________________  External Attachment:    Type:   Image     Comment:   External Document

## 2010-11-11 NOTE — Progress Notes (Signed)
Summary: refill/gh  Phone Note Refill Request Message from:  Pharmacy on May 26, 2007 11:00 AM  Refills Requested: Medication #1:  PAXIL 30 MG TABS Take 2 tablets by mouth once a day   Last Refilled: 04/25/2007  Method Requested: Electronic Initial call taken by: Angelina Ok RN,  May 26, 2007 11:00 AM  Follow-up for Phone Call        done Follow-up by: Ned Grace MD,  May 26, 2007 12:25 PM      Prescriptions: PAXIL 30 MG TABS (PAROXETINE HCL) Take 2 tablets by mouth once a day  #60 x 3   Entered and Authorized by:   Ned Grace MD   Signed by:   Ned Grace MD on 05/26/2007   Method used:   Electronically sent to ...       [facility] [phone]       [address]       [place], [place]  [address]       Ph: [phone] or [phone]       Fax: [phone]   RxID:   [id number]

## 2010-11-11 NOTE — Letter (Signed)
Summary: Handout Printed  Printed Handout:  - *Patient Instructions 

## 2010-11-11 NOTE — Progress Notes (Signed)
  Phone Note Outgoing Call   Call placed by: Theotis Barrio NT II,  August 15, 2008 11:54 AM Call placed to: Patient Details for Reason: RESCHULDING OF X-RAY Summary of Call: SPOKE WITH PATIENT ABOUT CHANGEING OF APPT.  / APPT IS FOR NOV. 17, 09 AT 11:00. PATIENT IS VERY WELL AWARE OF THIS APPT. LELA STUDIVANT NTII

## 2010-11-11 NOTE — Miscellaneous (Signed)
Summary: CERTIFICATE OF MEDICAL NECESSITY  CERTIFICATE OF MEDICAL NECESSITY   Imported By: Margie Billet 02/22/2008 14:20:55  _____________________________________________________________________  External Attachment:    Type:   Image     Comment:   External Document

## 2010-11-11 NOTE — Assessment & Plan Note (Signed)
Summary: FU OV/VS   Vital Signs:  Patient Profile:   65 Years Old Female Height:     64 inches (162.56 cm) Weight:      236.1 pounds (107.32 kg) BMI:     40.67 Temp:     97.5 degrees F (36.39 degrees C) oral Pulse rate:   86 / minute BP sitting:   124 / 66  (right arm) Cuff size:   large  Pt. in pain?   yes    Location:   left hand    Intensity:   6  Vitals Entered By: [name] Dull) (November 10, 2007 9:35 AM)              Is Patient Diabetic? Yes  Nutritional Status BMI of > 30 = obese CBG Result 192  Have you ever been in a relationship where you felt threatened, hurt or afraid?No   Does patient need assistance? Functional Status Self care Ambulation Impaired:Risk for fall     PCP:  Madelaine Etienne MD  Chief Complaint:  f/u.  History of Present Illness: 65 y/o AAF with h/o HTN, DM2, DOE s/p extensive workup, CAD, morbid obesity, spinal stenosis, Fe-def anemia presents for f/u.  The pt is also being followed by Dr. Riley Kill ([facility] - last saw on Mon, 1/26) and Dr. Marikay Alar (Ortho; next appt in  ~2 mos).  Today, the pt has no complaints.  Feels she has been doing well since her last visit.  Acknowledges some weight gain over the last 6 months, but attributes this to inactivity s/p lumbar spine surgery last October.  Otherwise, feels okay.  Has intermittent baseline DOE, but cardiac workup has been done and was unrevealing.  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  Denies any CP, palpitations, worsening dyspnea, N/V/D, constipation. No fevers, chills, or back/leg pain. Wants to return to rehab when cleared by Dr. Yetta Barre, as she feels this helped her breathing and will help her lose some weight.    Of note, endorses some fluctuating lower ext edema, which she says is about baseline today.  I ordered some compression stockings for her to wear after her last visit earlier this month,  but she tells me today that she couldn't find a store that carried them and so hasn't gotten a pair yet.  Pt says she is scheduled for a Pap smear in 3/09 and a mammogram in 7/09.  Current Allergies (reviewed today): No known allergies   Past Medical History:    Reviewed history from 08/26/2006 and no changes required:       Current Problems:        DYSLIPIDEMIA (ICD-272.4)       OBESITY (ICD-278.00)       CAD (ICD-414.00)       DIABETES MELLITUS, TYPE II (ICD-250.00)       HYPERTENSION (ICD-401.9)       DYSPNEA ON EXERTION (ICD-786.09)       ANEMIA, IRON DEFICIENCY (ICD-280.9)       POSTMENOPAUSAL BLEEDING (ICD-627.1)       DEGENERATIVE JOINT DISEASE (ICD-715.90)       SPINAL STENOSIS, LUMBAR (ICD-724.02)       GERD (ICD-530.81)       DEPRESSION (ICD-311)       GLAUCOMA NOS (ICD-365.9)           Risk Factors: Tobacco use:  never Alcohol use:  no Exercise:  yes  Times per week:  one day    Type:  walking Seatbelt use:  100 %  Family History Risk Factors:    Family History of MI in females < 69 years old:  no    Family History of MI in males < 81 years old:  no   Review of Systems       The patient complains of dyspnea on exhertion and peripheral edema.  The patient denies anorexia, fever, weight loss, vision loss, chest pain, syncope, prolonged cough, hemoptysis, abdominal pain, melena, hematochezia, severe indigestion/heartburn, and hematuria.     Physical Exam  General:     Alert, morbidly obese African-American woman in no acute distress. Ayyan Sites:     Normocephalic and atraumatic. Eyes:     Bespectacled.  PERRLA.  EOMI.  No scleral icterus or conjunctival injection. Nose:     No external deformity.  No nasal discharge. Mouth:     Fair dentition.  Oropharynx pink and moist. Neck:     Supple without LAD or TM. Lungs:     Distant breath sounds 2/2 habitus, but no wheezes, rales, or rhonchi appreciated. Heart:     Distant heart sounds 2/2 habitus.  Regular  rate and rhythm without appreciable murmurs, rubs, or gallops. Abdomen:     Obese, soft, non-tender, non-distended.  Normoactive bowel sounds. Pulses:     Radial and carotid pulses 2+ and symmetric bilaterally.  Posterior tibial and dorsalis pedis pulses 1+ and symmetric bilaterally. Extremities:     1+ bilateral lower extremity edema, L marginally worse than R.  Faint pitting bilaterally.  Diabetic foot exam performed at last visit earlier this month. Neurologic:     A&Ox3.  CN grossly intact.  Sensation intact throughout, including over the bilateral legs and feet. Psych:     Good eye contact.  Appropriate and cooperative.    Impression & Recommendations:  Problem # 1:  HYPERTENSION (ICD-401.9) BP remains at goal.  Will continue current regimen for now. I will recheck a BMET today, given that she was hyperkalemic (transiently) earlier this month at 5.5, on repeat 2d later was 4.9.  If she becomes hyperkalemic again, we may need to reconsider using an ACEI, though I would prefer to leave it if possible.  It's unclear to me what effect the Lasix is having on her blood pressure - this seems to have been added last year primarily for fluid control, but I would prefer that she try the compression stockings and see if we can wean (or even stop entirely) the Lasix.  Repeat BP check as usual at next visit.  Her updated medication list for this problem includes:    Lasix 80 Mg Tabs (Furosemide) .[name]... Take 1and 1/2  tablets by mouth once a day    Lotensin 40 Mg Tabs (Benazepril hcl) .[name]... Take 1 tablet by mouth once a day    Atenolol 100 Mg Tabs (Atenolol) .[name]... Take 1/2 pill daily by mouth.  Orders: T-Basic Metabolic Panel [phone])  BP today: 124/66 Prior BP: 117/66 (10/19/2007)  Labs Reviewed: Creat: 0.60 (10/21/2007)   Problem # 2:  DM W/NEURO MANIFESTATIONS, TYPE II (ICD-250.60) Stable intermittent low ext stinging/burning consistent with diabetic neuropathy.  I refilled her  gabapentin at last visit, so we will see what effect this has on her symptoms.  Last HgbA1c was 7.3%.  Her updated medication list for this problem includes:    Aspirin 81 Mg Tbec (Aspirin) .[name]... Take 1 tablet by mouth once a day  Lotensin 40 Mg Tabs (Benazepril hcl) .[name]... Take 1 tablet by mouth once a day    Glucophage 1000 Mg Tabs (Metformin hcl) .[name]... Take 1 tablet by mouth two times a day    Glucotrol 10 Mg Tabs (Glipizide) .[name]... Take 1 tablet by mouth two times a day    Actos 30 Mg Tabs (Pioglitazone hcl) .[name]... Take 1 tablet by mouth once a day  Labs Reviewed: HgBA1c: 7.3 (10/19/2007)   Creat: 0.60 (10/21/2007)      Problem # 3:  LEG EDEMA (ICD-782.3) See Assessment #1.  The Lasix is probably having a marginal effect, if any, on this edema.  After d/w Dr. Harvie Junior at pt's last visit, I really want her to try out a pair of compression stockings and see if we can wean the Lasix entirely.  We explained to her where to pick up a pair of these today, as she couldn't find a store that carried them after her last visit.  Her updated medication list for this problem includes:    Lasix 80 Mg Tabs (Furosemide) .[name]... Take 1and 1/2  tablets by mouth once a day   Problem # 4:  HYPOKALEMIA (ICD-276.8) Pt has been intermittently hypokalemic and, more recently, mildly hyperkalemic.  The reasons for this are unclear to me, but could be related to taking some medications some days (e.g., Lasix) and not others, then reversing the process and taking benazepril daily without a diuretic.  Still, I don't think this is the case, as Ms. Cella is very astute and tells me that she takes all of her medicines every day as directed.  She is still on Slow-Mag as well for a h/o low Mg (assoc with low K).  I will recheck her Mg today as well as a BMET.  Orders: T-Magnesium ([id number])   Problem # 5:  CAD (ICD-414.00) Pt is followed by Dr. Riley Kill at [facility].  Per last note, he will see her again in 6 months.  No  chest pain, palpitations, or dyspnea today.  Her updated medication list for this problem includes:    Aspirin 81 Mg Tbec (Aspirin) .[name]... Take 1 tablet by mouth once a day    Lasix 80 Mg Tabs (Furosemide) .[name]... Take 1and 1/2  tablets by mouth once a day    Lotensin 40 Mg Tabs (Benazepril hcl) .[name]... Take 1 tablet by mouth once a day    Nitroquick Subl (Nitroglycerin subl) ..... Use as directed    Imdur 30 Mg Tb24 (Isosorbide mononitrate) .[name]... Take 1 tablet by mouth once a day    Atenolol 100 Mg Tabs (Atenolol) .[name]... Take 1/2 pill daily by mouth.   Problem # 6:  DYSPNEA ON EXERTION (ICD-786.09) Pt has been to cardiopulmonary rehab in the past for dyspnea on exertion, the etiology of which is unclear to me, but may be related in part to her obesity.  She wishes to return to rehab once cleared by her back surgeon, Dr. Yetta Barre.  I encouraged her to ask him at their next visit when she can safely return, as this would almost certainly not harm and perhaps even help her level of function.  Her updated medication list for this problem includes:    Lasix 80 Mg Tabs (Furosemide) .[name]... Take 1and 1/2  tablets by mouth once a day    Lotensin 40 Mg Tabs (Benazepril hcl) .[name]... Take 1 tablet by mouth once a day    Atenolol 100 Mg Tabs (Atenolol) .[name]... Take 1/2 pill daily by  mouth.   Problem # 7:  DYSLIPIDEMIA (ICD-272.4) I do not see a recent lipid profile.  Pt should have one drawn prior to next visit so that her medications can be appropriately adjusted.  Last AST/ALT were normal and she has no complaints of myalgias.  Her updated medication list for this problem includes:    Pravachol 80 Mg Tabs (Pravastatin sodium) .[name]... Take 1 tablet by mouth once a day  Labs Reviewed: SGOT: 12 (10/19/2007)   SGPT: 11 (10/19/2007)   Complete Medication List: 1)  Aspirin 81 Mg Tbec (Aspirin) .... Take 1 tablet by mouth once a day 2)  Lasix 80 Mg Tabs (Furosemide) .... Take 1and 1/2  tablets by mouth once a day 3)   Paxil 30 Mg Tabs (Paroxetine hcl) .... Take 2 tablets by mouth once a day 4)  Lotensin 40 Mg Tabs (Benazepril hcl) .... Take 1 tablet by mouth once a day 5)  Glucophage 1000 Mg Tabs (Metformin hcl) .... Take 1 tablet by mouth two times a day 6)  Glucotrol 10 Mg Tabs (Glipizide) .... Take 1 tablet by mouth two times a day 7)  Nitroquick Subl (Nitroglycerin subl) .... Use as directed 8)  Imdur 30 Mg Tb24 (Isosorbide mononitrate) .... Take 1 tablet by mouth once a day 9)  Pepcid 20 Mg Tabs (Famotidine) .... Take 1 tablet by mouth two times a day 10)  Actos 30 Mg Tabs (Pioglitazone hcl) .... Take 1 tablet by mouth once a day 11)  Neurontin 300 Mg Caps (Gabapentin) .... Take 1 tablet by mouth once a day 12)  Pravachol 80 Mg Tabs (Pravastatin sodium) .... Take 1 tablet by mouth once a day 13)  Ferrous Sulfate 325 (65 Fe) Mg Tabs (Ferrous sulfate) .... Take 1 tablet by mouth two times a day 14)  Atenolol 100 Mg Tabs (Atenolol) .... Take 1/2 pill daily by mouth. 15)  Slow-mag 64 Mg Tbcr (Magnesium chloride) .... Take 1 tablet by mouth two times a day 16)  Amitriptyline Hcl 25 Mg Tabs (Amitriptyline hcl) .... Take 1 tablet by mouth 30 minutes before bedtime.   Patient Instructions: 1)  Please make a follow-up appointment in 2-3 months to check your blood pressure and follow-up on your diabetes. 2)  Please try to have the prescription for compression stockings filled and begin using them.  Let us know if you have any problems with them.    ]

## 2010-11-11 NOTE — Assessment & Plan Note (Signed)
Summary: [name]   Vital Signs:  Patient profile:   65 year old female Height:      64 inches (162.56 cm) Weight:      226.6 pounds (103.00 kg) BMI:     39.04 Temp:     98.7 degrees F (37.06 degrees C) oral Pulse rate:   68 / minute BP sitting:   131 / 69  (right arm)  Vitals Entered By: [name] Dull) (July 25, 2009 9:17 AM) CC: neck pain x Is Patient Diabetic? Yes  Nutritional Status BMI of > 30 = obese CBG Result 142  Does patient need assistance? Functional Status Self care Ambulation Normal   Primary Care Provider:  Joaquin Courts  MD  CC:  neck pain x .  History of Present Illness: Last 2-3 weeks,neck pain has been bothering [name] [name] more than in the past. Pain is 6/10, sharp, comes with neck motion, while turning from side to side or looking up or down. Hurts more on left than on right. Tylenol helps some. Patient has tried Advil as well and that helps as well. Takes about one advil every 4 hours. No history of bleeding ulcers. Patient difficulty getting good rest secondary to pain in neck. Has had episodes of neck pain flares in the past where Tylenol did not suffice, but has never tried anything else.   Depression History:      The patient is having a depressed mood most of the day and has a diminished interest in her usual daily activities.         Allergies (verified): No Known Drug Allergies  Past History:  Past Surgical History: Last updated: 10/19/2007 Posterior lumbar interfusion surgery at L2-L3 (Dr. Marikay Alar), 07/18/07 Lumbar fusion surgery at L3-L5 (s/p hardware removal in 10/08 on re-exploration), c. 2006 Rotator cuff repair, date unknown  Family History: Last updated: 05/03/2007 Family = 3 brothers, 9 sisters (1 deceased).   Pt's father and sister developed type II DM.   No MIs or strokes in pt's family of which pt is aware. No h/o cancer in family. Pt's 2 sisters have HTN and HL.  Social History: Last  updated: 07/25/2009 Pt doesn't work currently, but used to work at [employer] as a Production assistant, radio. Single.   Never used tobacco. No alcohol or illicit drug use. Does exercise regularly. Walks 3 times/wk. Goes to exercise at [facility]  Risk Factors: Alcohol Use: 0 (06/06/2009) Exercise: yes (06/06/2009)  Risk Factors: Smoking Status: never (06/06/2009)  Social History: Pt doesn't work currently, but used to work at [employer] as a Production assistant, radio. Single.   Never used tobacco. No alcohol or illicit drug use. Does exercise regularly. Walks 3 times/wk. Goes to exercise at [facility]  Review of Systems General:  Denies chills, fatigue, fever, sweats, and weakness. Eyes:  Denies blurring and vision loss-both eyes. ENT:  Denies earache. CV:  Denies chest pain or discomfort, difficulty breathing at night, difficulty breathing while lying down, fainting, near fainting, palpitations, swelling of feet, and swelling of hands. Resp:  Denies chest pain with inspiration, cough, sputum productive, and wheezing. GI:  Denies abdominal pain, bloody stools, change in bowel habits, constipation, diarrhea, nausea, and vomiting.  Physical Exam  General:  alert, well-nourished, well-hydrated, appropriate dress, cooperative to examination, good hygiene, and overweight-appearing.   Head:  normocephalic and atraumatic.   Eyes:  vision grossly intact, pupils equal, pupils round, and pupils reactive to light.   Ears:  no external deformities.   Nose:  no  external deformity.   Mouth:  pharynx pink and moist, no erythema, and no exudates.   Neck:  supple, full ROM, no thyromegaly, no thyroid nodules or tenderness, no carotid bruits, and no cervical lymphadenopathy.  Prominent R carotid noted. On dorsum of neck, there is a area that appears somewhat swollen, without fluctuance, erythema or pain with palpation. Lungs:  normal respiratory effort, no intercostal retractions, no accessory muscle use, normal breath sounds, no crackles,  and no wheezes.   Heart:  normal rate, regular rhythm, no murmur, no gallop, and no rub.   Abdomen:  Obese, soft, non-tender, normal bowel sounds, no guarding, no rigidity, and no rebound tenderness.   Msk:  Some pain in neck while performing ROM maneuvers. No loss of strength. Neurologic:  alert & oriented X3, cranial nerves II-XII intact, strength normal in both upper and lower extremities, and sensation intact to light touch.   Skin:  turgor normal, color normal, and no rashes.   Cervical Nodes:  no anterior cervical adenopathy and no posterior cervical adenopathy.   Psych:  Oriented X3, memory intact for recent and remote, normally interactive, good eye contact, not anxious appearing, and not depressed appearing.     Impression & Recommendations:  Problem # 1:  NECK PAIN (ICD-723.1)  Patient uncontrolled with current pain medication - pain in neck chronic, but patient admits to flares of increased pain. Will try on Tylenol 3 and decrease other tylenol dosing accordingly, after attempting to use Tramadol - but a negative interaction with Paxil was noted - for which Tylenol 3 was chosen. Will refer to her neurosurgeon, Dr. Maia Breslow for further evaluation of increasing neck pain. No neurological symptoms (weakness/numbness) on exam or from patient history. Pain localized to neck, left side > right with some radiation to left shoulder area.  Her updated medication list for this problem includes:    Aspirin 81 Mg Tbec (Aspirin) .[name]... Take 1 tablet by mouth once a day    Tylenol 325 Mg Tabs (Acetaminophen) .[name]..[name] Two tabs every eight hours as needed forpain.    Tylenol With Codeine #3 300-30 Mg Tabs (Acetaminophen-codeine) .[name]... Take 1 tablet by mouth two times a day as needed for pain  Orders: Neurosurgeon Referral (Neurosurgeon)  Problem # 2:  DIABETES MELLITUS, TYPE II (ICD-250.00) Patient doing well with glucose control. Congratulated for A1c and weight loss. Up to date with  preventative exams. Will check microalbumin/Cr ratio today. No other changes at this time.  Her updated medication list for this problem includes:    Aspirin 81 Mg Tbec (Aspirin) .[name]... Take 1 tablet by mouth once a day    Lotensin 40 Mg Tabs (Benazepril hcl) .[name]... Take 1 tablet by mouth once a day    Glucophage 1000 Mg Tabs (Metformin hcl) .[name]... Take 1 tablet by mouth two times a day    Glucotrol 10 Mg Tabs (Glipizide) .[name]... Take 1 tablet by mouth two times a day  Orders: T-Hgb A1C (in-house) (62952WU) T- Capillary Blood Glucose (13244) T-Urine Microalbumin w/creat. ratio [phone])  Problem # 3:  HYPERTENSION (ICD-401.9) Patient blood pressure improved from last visit. Encouraged patient to continue diet and exercise. No changes at this time.  Her updated medication list for this problem includes:    Lasix 80 Mg Tabs (Furosemide) .[name]... Take 1/2  tablet by mouth once a day    Lotensin 40 Mg Tabs (Benazepril hcl) .[name]... Take 1 tablet by mouth once a day    Atenolol 100 Mg Tabs (Atenolol) .Marland KitchenMarland KitchenMarland KitchenMarland Kitchen  Take 1/2 pill daily by mouth.  BP today: 131/69 Prior BP: 142/62 (06/06/2009)  Labs Reviewed: K+: 4.2 (06/06/2009) Creat: : 0.64 (06/06/2009)   Chol: 138 (12/27/2008)   HDL: 61 (12/27/2008)   LDL: 65 (12/27/2008)   TG: 59 (12/27/2008)  Problem # 4:  Preventive Health Care (ICD-V70.0) Received flu shot today. Patient waiting for transportation and states that she has had recent pap with Dr. Andrey Campanile. Will request report and update EMR with results if obtained. May need pap at next appointment.  Complete Medication List: 1)  Aspirin 81 Mg Tbec (Aspirin) .... Take 1 tablet by mouth once a day 2)  Lasix 80 Mg Tabs (Furosemide) .... Take 1/2  tablet by mouth once a day 3)  Paxil 30 Mg Tabs (Paroxetine hcl) .... Take 2 tablets by mouth once a day 4)  Lotensin 40 Mg Tabs (Benazepril hcl) .... Take 1 tablet by mouth once a day 5)  Glucophage 1000 Mg Tabs (Metformin hcl) .... Take 1 tablet by  mouth two times a day 6)  Glucotrol 10 Mg Tabs (Glipizide) .... Take 1 tablet by mouth two times a day 7)  Nitroquick Subl (Nitroglycerin subl) .... Use as directed 8)  Imdur 30 Mg Tb24 (Isosorbide mononitrate) .... Take 1 tablet by mouth once a day 9)  Pepcid 20 Mg Tabs (Famotidine) .... Take 1 tablet by mouth two times a day 10)  Pravachol 80 Mg Tabs (Pravastatin sodium) .... Take 1 tablet by mouth once a day 11)  Ferrous Sulfate 325 (65 Fe) Mg Tabs (Ferrous sulfate) .... Take 1 tablet by mouth two times a day 12)  Atenolol 100 Mg Tabs (Atenolol) .... Take 1/2 pill daily by mouth. 13)  Tylenol 325 Mg Tabs (Acetaminophen) .... Two tabs every eight hours as needed forpain. 14)  Oscal 500/200 D-3 500-200 Mg-unit Tabs (Calcium-vitamin d) .... Take 1 tablet by mouth three times a day 15)  Mag-ox 400 400 Mg Tabs (Magnesium oxide) .... Take 1 tablet by mouth two times a day 16)  Tylenol With Codeine #3 300-30 Mg Tabs (Acetaminophen-codeine) .... Take 1 tablet by mouth two times a day as needed for pain  Other Orders: Admin 1st Vaccine (16109) Flu Vaccine 74yrs + (60454)  Patient Instructions: 1)  Please schedule a follow-up appointment in 2 months. 2)  Check your blood sugars regularly. 3)  Check your Blood Pressure regularly.  First set of prescriptions seen below were not printed on Rx paper. Needed reprint, therefore there is a duplicate set of scripts.  Prescriptions: TYLENOL 325 MG TABS (ACETAMINOPHEN) Two tabs every eight hours as needed forpain.  #180 x 2   Entered and Authorized by:   Nilda Riggs MD   Signed by:   Nilda Riggs MD on 07/25/2009   Method used:   Print then Give to Patient   RxID:   [id number] TYLENOL WITH CODEINE #3 300-30 MG TABS (ACETAMINOPHEN-CODEINE) Take 1 tablet by mouth two times a day as needed for pain  #60 x 0   Entered and Authorized by:   Nilda Riggs MD   Signed by:   Nilda Riggs MD on 07/25/2009   Method used:   Print then Give to Patient    RxID:   [id number] TYLENOL 325 MG TABS (ACETAMINOPHEN) Two tabs every eight hours as needed forpain.  #180 x 2   Entered and Authorized by:   Nilda Riggs MD   Signed by:   Nilda Riggs MD on 07/25/2009   Method used:  Print then Give to Patient   RxID:   [id number] TYLENOL WITH CODEINE #3 300-30 MG TABS (ACETAMINOPHEN-CODEINE) Take 1 tablet by mouth two times a day as needed for pain  #60 x 0   Entered and Authorized by:   Nilda Riggs MD   Signed by:   Nilda Riggs MD on 07/25/2009   Method used:   Print then Give to Patient   RxID:   [id number]  Process Orders Check Orders Results:     [facility]: ABN not required for this insurance Tests Sent for requisitioning (July 25, 2009 1:51 PM):     07/25/2009: [facility] -- T-Urine Microalbumin w/creat. ratio [[id number]] (signed)    Laboratory Results   Blood Tests   Date/Time Received: July 25, 2009 9:30 AM  Date/Time Reported: Alric Quan  July 25, 2009 9:30 AM   HGBA1C: 6.7%   (Normal Range: Non-Diabetic - 3-6%   Control Diabetic - 6-8%) CBG Random:: 142mg /dL     Prevention & Chronic Care Immunizations   Influenza vaccine: Fluvax 3+  (07/25/2009)    Tetanus booster: Not documented    Pneumococcal vaccine: Not documented    H. zoster vaccine: Not documented  Colorectal Screening   Hemoccult: Not documented    Colonoscopy:  Results: Hemorrhoids.       (01/14/2009)   Colonoscopy action/deferral: Repeat colonoscopy in 10 years.    (01/14/2009)   Colonoscopy due: 01/2019  Other Screening   Pap smear: Not documented    Mammogram: ASSESSMENT: Negative - BI-RADS 1^MM DIGITAL SCREENING  (06/05/2009)   Mammogram action/deferral: Screening mammogram in 1 year.     (06/04/2008)   Mammogram due: 06/2009    DXA bone density scan: L femoral neck  t score -1.5 c/w osteopenia   (06/04/2008)   DXA scan due: 06/2010   Reports requested:   Last  Pap report requested.  Smoking status: never  (06/06/2009)  Diabetes Mellitus   HgbA1C: 6.7  (07/25/2009)    Eye exam: No diabetic retinopathy.     (01/02/2009)   Eye exam due: 01/2010    Foot exam: yes  (12/27/2008)   High risk foot: Not documented   Foot care education: Not documented    Urine microalbumin/creatinine ratio: 5.5  (05/03/2008)   Urine microalbumin action/deferral: Ordered    Diabetes flowsheet reviewed?: Yes   Progress toward A1C goal: At goal  Lipids   Total Cholesterol: 138  (12/27/2008)   LDL: 65  (12/27/2008)   LDL Direct: Not documented   HDL: 61  (12/27/2008)   Triglycerides: 59  (12/27/2008)    SGOT (AST): 12  (12/27/2008)   SGPT (ALT): 8  (12/27/2008)   Alkaline phosphatase: 83  (12/27/2008)   Total bilirubin: 0.4  (12/27/2008)    Lipid flowsheet reviewed?: Yes   Progress toward LDL goal: At goal  Hypertension   Last Blood Pressure: 131 / 69  (07/25/2009)   Serum creatinine: 0.64  (06/06/2009)   Serum potassium 4.2  (06/06/2009)    Hypertension flowsheet reviewed?: Yes   Progress toward BP goal: Improved  Self-Management Support :   Personal Goals (by the next clinic visit) :     Personal A1C goal: 7  (07/25/2009)     Personal blood pressure goal: 130/80  (07/25/2009)     Personal LDL goal: 100  (07/25/2009)    Patient will work on the following items until the next clinic visit to reach self-care goals:     Medications and monitoring: take my medicines  every day, bring all of my medications to every visit  (07/25/2009)     Eating: drink diet soda or water instead of juice or soda, eat more vegetables, eat foods that are low in salt, eat baked foods instead of fried foods, eat fruit for snacks and desserts  (07/25/2009)     Activity: take a 30 minute walk every day  (07/25/2009)    Diabetes self-management support: Written self-care plan  (07/25/2009)   Diabetes care plan printed   Last diabetes self-management training by diabetes  educator: 04/18/2009    Hypertension self-management support: Written self-care plan  (07/25/2009)   Hypertension self-care plan printed.    Lipid self-management support: Written self-care plan  (07/25/2009)   Lipid self-care plan printed.   Nursing Instructions: Give Flu vaccine today Request report of last Pap  Flu Vaccine Consent Questions     Do you have a history of severe allergic reactions to this vaccine? no    Any prior history of allergic reactions to egg and/or gelatin? no    Do you have a sensitivity to the preservative Thimersol? no    Do you have a past history of Guillan-Barre Syndrome? no    Do you currently have an acute febrile illness? no    Have you ever had a severe reaction to latex? no    Vaccine information given and explained to patient? yes    Are you currently pregnant? no    Lot [id number]   Exp Date:01/09/2010   Manufacturer: [employer]    Site Given  Left Deltoid IM.Krystal Eaton (AAMA)  July 25, 2009 10:50 AM    Hypertension self-care plan printed.    Lipid self-management support: Written self-care plan  (07/25/2009)   Lipid self-care plan printed.   Nursing Instructions: Give Flu vaccine today Request report of last Pap  .mchsflu

## 2010-11-11 NOTE — Consult Note (Signed)
Summary: [facility]: Diabetic Exam  [facility]: Diabetic Exam   Imported By: Florinda Marker 01/12/2008 15:03:13  _____________________________________________________________________  External Attachment:    Type:   Image     Comment:   External Document  Appended Document: [facility]: Diabetic Exam    Clinical Lists Changes  Observations: Added new observation of [id number]: 07/2008 (01/12/2008 17:35) Added new observation of DIAB EYE EX: No diabetic retinopathy.    VA: 20/20 OD, 20/20 OS IOP: 17 OD, 18 OS  Performed by Dr. Dione Booze (01/06/2008 17:36)        Diabetic Eye Exam  Procedure date:  01/06/2008  Findings:      No diabetic retinopathy.    VA: 20/20 OD, 20/20 OS IOP: 17 OD, 18 OS  Performed by Dr. Dione Booze  Procedures Next Due Date:    Diabetic Eye Exam: 07/2008

## 2010-11-11 NOTE — Progress Notes (Signed)
Summary: diabetes follow-up/dmr  Phone Note Outgoing Call Call back at [facility] Phone [phone]   Call placed by: Jamison Neighbor RD,CDE,  May 30, 2009 3:19 PM Summary of Call: follow-up for dsmt:unsure of  weight/ improved blood sugars: no blood sugars mid-sleep, walking about the same-3x/week, eating about the same, hasn't weighed since last visit- woud like to purchase scale fo rher home. blood sugar has come down a whole lot: asked for numbers: CBGS tioday: 143 this am, 112 at lunch today Wednesday 156 / 166/ 87 tues 170/114/ 104  monday: 104/ 170  sunday :74/99/60 saturday:77/86/105 friday:98/84/54 thursday:83/52/69 wednesday: 95/66/55 tuesday: 84/57/63 monday: 87/97/68  Patient patient says blood sugars have been this low for severl weeks- she has no symptoms of hypoglycemia- we went over them.asked her ot call us right away if sh edoes have symptoms and to also bring her meter and we'd check it for accuracy at her next visit. patient reports that she is having a difficutl time getting an appointment.  will send note to front office re getting her an ppointment and doctor to reveiw her CBgs.   Follow-up for Phone Call        If her blood sugars are that low, we can probably get rid of the actos.  I agree, that she needs an appt to make sure her meter is accurate and to go over her blood sugars/meds. Follow-up by: Joaquin Courts  MD,  May 30, 2009 8:39 PM  Additional Follow-up for Phone Call Additional follow up Details #1::        Did you want her to stop her actos now or wait to see her in the office to determine this?  Additional Follow-up by: Jamison Neighbor RD,CDE,  May 31, 2009 3:55 PM    Additional Follow-up for Phone Call Additional follow up Details #2::    I would stop the actos now since she is having lows int he 50's.  her last a1c wasn't that bad anyway but she was concerned about it.  thank you. Follow-up by: Joaquin Courts  MD,  May 31, 2009 10:47 PM   Additional Follow-up for Phone Call Additional follow up Details #3:: Details for Additional Follow-up Action Taken: please give me a specific order and discontinue Actos from her medication list. thank you.  OK to d/c and took it off her med list.  Thanks. Additional Follow-up by: Jamison Neighbor RD,CDE,  June 03, 2009 11:53 AM

## 2010-11-11 NOTE — Letter (Signed)
Summary: [facility]  [facility]   Imported By: Florinda Marker 08/28/2008 14:39:40  _____________________________________________________________________  External Attachment:    Type:   Image     Comment:   External Document

## 2010-11-11 NOTE — Letter (Signed)
Summary: BLOOD GLUCOSE REPORT  BLOOD GLUCOSE REPORT   Imported By: Margie Billet 02/19/2010 16:15:24  _____________________________________________________________________  External Attachment:    Type:   Image     Comment:   External Document

## 2010-11-11 NOTE — Assessment & Plan Note (Signed)
Summary: DM TEACHING TO DISCUSS INSULIN PER [facility]   Vital Signs:  Patient profile:   65 year old female Weight:      221.7 pounds BMI:     38.19 Is Patient Diabetic? Yes Did you bring your meter with you today? Yes Comments patient reports fasting blood sugars are still high 100s and 200s. 171 this morning per patient. Did not download meter today as it was downloaded 2 weeks ago and blood sugars hae not changed.   Allergies: No Known Drug Allergies   Complete Medication List: 1)  Aspirin 81 Mg Tbec (Aspirin) .... Take 1 tablet by mouth once a day 2)  Lasix 80 Mg Tabs (Furosemide) .... Take 1/2  tablet by mouth once a day 3)  Paxil 30 Mg Tabs (Paroxetine hcl) .... Take 1 tablet by mouth once a day. 4)  Lotensin 40 Mg Tabs (Benazepril hcl) .... Take 1 tablet by mouth once a day 5)  Glucophage 1000 Mg Tabs (Metformin hcl) .... Take 1 tablet by mouth two times a day 6)  Glucotrol 10 Mg Tabs (Glipizide) .... Take 2 tablets by mouth two times a day 7)  Nitroquick Subl (Nitroglycerin subl) .... Use as directed 8)  Imdur 30 Mg Tb24 (Isosorbide mononitrate) .... Take 1 tablet by mouth once a day 9)  Pravachol 80 Mg Tabs (Pravastatin sodium) .... Take 1 tablet by mouth once a day 10)  Ferrous Sulfate 325 (65 Fe) Mg Tabs (Ferrous sulfate) .... Take 1 tablet by mouth two times a day 11)  Atenolol 100 Mg Tabs (Atenolol) .... Take 1/2 pill daily by mouth. 12)  Tylenol 325 Mg Tabs (Acetaminophen) .... Two tabs every eight hours as needed forpain. 13)  Calcium Carbonate 600 Mg Tabs (Calcium carbonate) .... Take 1 tablet by mouth two times a day 14)  Tylenol With Codeine #3 300-30 Mg Tabs (Acetaminophen-codeine) .... Take 1 tablet by mouth two times a day as needed for pain 15)  Cobal-1000 1000 Mcg/ml Soln (Cyanocobalamin) .... Inject 1 ml once monthly. 16)  Vitamin D3 1000 Unit Tabs (Cholecalciferol) .... Take 1 tablet by mouth once a day 17)  Mag-delay 535 (64 Mg) Mg Cr-tabs (Magnesium  chloride) .... Take 1 tablet by mouth two times a day 18)  Voltaren 1 % Gel (Diclofenac sodium) .... Apply small amount to affected area up to 4 times a day. 19)  Syringe 23g X 3/4" 3 Ml Misc (Syringe/needle (disp)) .... Use to injection b12. 20)  Lantus Solostar 100 Unit/ml Soln (Insulin glargine) .... Inject 5 units at bedtime. 21)  Naproxen 375 Mg Tabs (Naproxen) .[name].. 1 by mouth two times a day for 2 weeks and then bod as needed hand pain 22)  Prodigy Mini Pen Needles 31g X 5 Mm Misc (Insulin pen needle) .... Use to inject lantus insulin one time each day  Other Orders: DSMT (Medicaid) 60 Minutes [phone])  Patient Instructions: 1)  start injecting 5 units Lantus insluin one time a day as soon as you pick it up 2)  Make an appointment wiht one of our doctors for 2 weeks. 3)  Continue all othe rmedicine until you see the doctor 4)  Contiue chekcing your b lood sugars as you hav been- most important check is before breakfast  Diabetes Self Management Training  PCP: Nelda Bucks DO Date diagnosed with diabetes: 10/12/1982 Diabetes Type: Type 2 non-insulin treated Other persons present: no Current smoking Status: never  Vital Signs Todays Weight: 221.7lb  in BMI 38.19in-lbs  Assessment Daily activities: less active duirng winter than she had been, says she plans to begin getting on her stationary bicycle more Sources of Support: injects herself with B12 monthly in thigh  Diabetes Medications:  Lipid lowering Meds? Yes Anti-platelet Meds? Yes Herbs or Supplements: No Comments: Taught patient how to use Lantus Solostar insluin pen. Gave her pen needle sample/insulin starter kit. She repeated demonstration with insulin pen successfully and gave herself and injection of 5 units saline in her abdomen today. Verbalized understanding of storage of insulin. She'd like ot inject insluin at bedtime     Would you  say you are physically active: Yes Diabetes Disease Process  Discussed  today Define diabetes in simple terms: Needs review/assistance Medications State name-action-dose-duration-side effects-and time to take medication: Demonstrates competency   State appropriate timing of food related to medication: Demonstrates competency   Demonstrates/verbalizes site selection and rotation for injections Demonstrates competency   Correctly draw up and administer insulin-Byetta-Symlin-glucagon: Demonstrates competency   Describe safe needle/lancet disposal: Demonstrates competency    Nutritional Management State changes planned for home meals/snacks: Needs review/assistance    Monitoring  Complications State the causes- signs and symptoms and prevention of hypoglycemia: Needs review/assistance Exercise Diabetes Management Education Done: 03/26/2010    BEHAVIORAL GOALS INITIAL Utilizing medications if for therapeutic effectiveness: inject insulin once daily and folllow up in two weeks        Needs prescriptions sent to pharmacy- will send to attending.   Encouragement given to continue weight loss efforts. Diabetes Self Management Support: clinic staff, family- grandson  Follow-up: 3 months in office

## 2010-11-11 NOTE — Assessment & Plan Note (Signed)
Summary: DM TRAINING/VS   Vital Signs:  Patient profile:   65 year old female Weight:      241.5 pounds BMI:     41.60  Allergies: No Known Drug Allergies   Complete Medication List: 1)  Aspirin 81 Mg Tbec (Aspirin) .... Take 1 tablet by mouth once a day 2)  Lasix 80 Mg Tabs (Furosemide) .... Take 1/2  tablet by mouth once a day 3)  Paxil 30 Mg Tabs (Paroxetine hcl) .... Take 2 tablets by mouth once a day 4)  Lotensin 40 Mg Tabs (Benazepril hcl) .... Take 1 tablet by mouth once a day 5)  Glucophage 1000 Mg Tabs (Metformin hcl) .... Take 1 tablet by mouth two times a day 6)  Glucotrol 10 Mg Tabs (Glipizide) .... Take 1 tablet by mouth two times a day 7)  Nitroquick Subl (Nitroglycerin subl) .... Use as directed 8)  Imdur 30 Mg Tb24 (Isosorbide mononitrate) .... Take 1 tablet by mouth once a day 9)  Pepcid 20 Mg Tabs (Famotidine) .... Take 1 tablet by mouth two times a day 10)  Actos 30 Mg Tabs (Pioglitazone hcl) .... Take 1 tablet by mouth once a day 11)  Pravachol 80 Mg Tabs (Pravastatin sodium) .... Take 1 tablet by mouth once a day 12)  Ferrous Sulfate 325 (65 Fe) Mg Tabs (Ferrous sulfate) .... Take 1 tablet by mouth two times a day 13)  Atenolol 100 Mg Tabs (Atenolol) .... Take 1/2 pill daily by mouth. 14)  Tylenol Ex St Arthritis Pain 500 Mg Tabs (Acetaminophen) .... Two tabs every eight hours as needed for pain. 15)  Oscal 500/200 D-3 500-200 Mg-unit Tabs (Calcium-vitamin d) .... Take 1 tablet by mouth three times a day 16)  Mag-ox 400 400 Mg Tabs (Magnesium oxide) .... Take 1 tablet by mouth two times a day  Other Orders: DSMT (Medicaid) 60 Minutes [phone])  Patient Instructions: 1)  make follow-up in 6 weeks. 2)  Call Lupita Leash [phone] on May 20th.  3)  Yousaid you are going to walk twice a day for 15 minutes each 5 days a week. 4)  Cjeck your feet every day 5)  Always carry candy or sugar tablets with you when you walk.       Diabetes Self Management Training  PCP:   Joaquin Courts  MD Diabetes Type: Type 2 non-insulin treated Other persons present: no Current smoking Status: never  Vital Signs Todays Weight: 241.5lb  in   BMI 41.60in-lbs   Diabetes Medications:  Comments: never misses medicine. see meter download average is 117 x 7 days, 119 x 14 days, testign twice daily. she is highest fasting-all but one fasting low to mid 100s, wheras daytime slbood sugars mostly under 100.    Monitoring Self monitoring blood glucose 2 times a day Measures urine ketones? No Name of Meter  [employer] Wears Medical I.D. No      Recent Episodes of: Hyperglycemia : No Hypoglycemia: Yes Severe Hypoglycemia : No     Estimated /Usual Carb Intake Breakfast # of Carbs/Grams half a bagel Lunch # of Carbs/Grams chicken, bread, greens with mrs dash and magerine Dinner # of Carbs/Grams checken greens bean, potatoes Bedtime # of Carbs/Grams 1/2 cup fruit cocktail Fat: Excessive fat intake  Nutrition assessment Weight change: Loss Amount of change: 0.3 pounds in 2- 3 weeks What beverages do you drink?  water, unsweet tea Biggest challenge to eating healthy: Eating too much  Activity Limitations  Inadequate physical activity Length of  time: # of times per week: 3  Barriers  Weather  Diabetes Disease Process Define diabetes in simple terms: Needs review/assistance State own type of diabetes: Demonstrates competency State diabetes is treated by meal plan-exercise-medication-monitoring-education: Demonstrates competency  Nutritional Management Identify what foods most often affect blood glucose: Needs review/assistance Verbalize importance of controlling food portions: Needs review/assistance State importance of spacing and not omitting meals and snacks: Needs review/assistance  Complications State the causes- signs and symptoms and prevention of hypoglycemia: Demonstrates competency Explain proper treatment of hypoglycemia:  Demonstrates competency State the relationship between blood pressure and lipid control in the prevention/control of cardiovascular disease: Demonstrates competency State the principles of skin-dental and foot care: Demonstrates competency Describe symptoms of skin and foot problems and describe foot exam: Demonstrates competency State when to seek medical advice and treatment: Demonstrates competency  Exercise States importance of exercise: Demonstrates competency States effect of exercise on blood glucose: Psychologist, forensic safety measures for exercise related to diabetes: Demonstrates competency  Lifestyle changes:Goal setting and Problem solving State benefits of making appropriate lifestyle changes: Demonstrates competency Identify lifestyle behaviors that need to change: Needs review/assistance Identify risk factors that interfere with health: Needs review/assistance Develop strategies to reduce risk factors: Needs review/assistance Verbalize need for and frequency of health care follow-up: Demonstrates competency Identify Family/SO role in managing diabetes: Demonstrates competency  Psychosocial Adjustment Identify how stress affects diabetes & two sources of stress: Demonstrates competency Name two ways of obtaining support from family/friends: Demonstrates competency Diabetes Management Education Done: 02/04/2009    BEHAVIORAL GOALS INITIAL   Specific goal set today: continue to eat smaller portions and limit further added margarine walk 15 minutes twice a day for 5 days a week  BEHAVIORAL GOAL FOLLOW UP Incorporating physical activity into lifestyle: 50%of the time Incorporating appropriate nutritional management: Most of the time      Diabetes Self Management Support: clinic staff, gave numbers for [facility] resource center for patient to participate in activities to encourage movement Follow-up: by phone in 3 weeks, 6 weeks in office  Last LDL:                                                  65 (12/27/2008 8:35:00 PM)

## 2010-11-11 NOTE — Assessment & Plan Note (Signed)
Summary: EST-REGULAR CHECK UPC/FB   Vital Signs:  Patient profile:   65 year old female Height:      64 inches (162.56 cm) Weight:      226.1 pounds (102.77 kg) BMI:     38.95 Temp:     97.1 degrees F (36.17 degrees C) oral Pulse rate:   67 / minute BP sitting:   132 / 69  (right arm)  Vitals Entered By: Stanton Kidney Ditzler RN (October 10, 2009 10:24 AM) Is Patient Diabetic? Yes Did you bring your meter with you today? Yes Pain Assessment Patient in pain? no      Nutritional Status BMI of > 30 = obese Nutritional Status Detail appetite good CBG Result 142  Have you ever been in a relationship where you felt threatened, hurt or afraid?denies   Does patient need assistance? Functional Status Self care Ambulation Impaired:Risk for fall Comments Uses a cane. Ck-up and refills on meds.   Primary Care Provider:  Joaquin Courts  MD   History of Present Illness: [name] [name] is a 65 yo lady with PMH as outlined in the EMR comes today for a f/u visit.   1. DM; Pt checks her CBG three times a day. It runs anywhere from 77 to 195, but mostly less than 140. She is on glipizide and metformin. She saw an eye MD on 11/10 and has an appt again on 3/10.   2. HTN: She takes lasix, benazepril, imdur and atenolol without any problem.   3. HL: She takes her pravachol regularly.   4. Depression: Mood is fine. No Si/Hi.   Depression History:      The patient denies a depressed mood most of the day and a diminished interest in her usual daily activities.         Preventive Screening-Counseling & Management  Alcohol-Tobacco     Alcohol drinks/day: 0     Smoking Status: never  Caffeine-Diet-Exercise     Does Patient Exercise: yes     Type of exercise: walking     Times/week: one day  Current Medications (verified): 1)  Aspirin 81 Mg Tbec (Aspirin) .... Take 1 Tablet By Mouth Once A Day 2)  Lasix 80 Mg Tabs (Furosemide) .... Take 1/2  Tablet By Mouth Once A Day 3)  Paxil 30 Mg Tabs  (Paroxetine Hcl) .... Take 2 Tablets By Mouth Once A Day 4)  Lotensin 40 Mg Tabs (Benazepril Hcl) .... Take 1 Tablet By Mouth Once A Day 5)  Glucophage 1000 Mg Tabs (Metformin Hcl) .... Take 1 Tablet By Mouth Two Times A Day 6)  Glucotrol 10 Mg Tabs (Glipizide) .... Take 1 Tablet By Mouth Two Times A Day 7)  Nitroquick  Subl (Nitroglycerin Subl) .... Use As Directed 8)  Imdur 30 Mg Tb24 (Isosorbide Mononitrate) .... Take 1 Tablet By Mouth Once A Day 9)  Pepcid 20 Mg Tabs (Famotidine) .... Take 1 Tablet By Mouth Two Times A Day 10)  Pravachol 80 Mg Tabs (Pravastatin Sodium) .... Take 1 Tablet By Mouth Once A Day 11)  Ferrous Sulfate 325 (65 Fe) Mg Tabs (Ferrous Sulfate) .... Take 1 Tablet By Mouth Two Times A Day 12)  Atenolol 100 Mg Tabs (Atenolol) .... Take 1/2 Pill Daily By Mouth. 13)  Tylenol 325 Mg Tabs (Acetaminophen) .... Two Tabs Every Eight Hours As Needed Forpain. 14)  Oscal 500/200 D-3 500-200 Mg-Unit Tabs (Calcium-Vitamin D) .... Take 1 Tablet By Mouth Three Times A Day 15)  Mag-Ox 400 400  Mg Tabs (Magnesium Oxide) .... Take 1 Tablet By Mouth Two Times A Day 16)  Tylenol With Codeine #3 300-30 Mg Tabs (Acetaminophen-Codeine) .... Take 1 Tablet By Mouth Two Times A Day As Needed For Pain  Allergies: No Known Drug Allergies  Review of Systems      See HPI  Physical Exam  Mouth:  pharynx pink and moist.   Lungs:  normal respiratory effort, no intercostal retractions, no accessory muscle use, normal breath sounds, no crackles, and no wheezes.   Heart:  normal rate, regular rhythm, no murmur, no gallop, and no rub.   Abdomen:  Obese, soft, non-tender, normal bowel sounds, no guarding, no rigidity, and no rebound tenderness.   Pulses:  R posterior tibial normal, R dorsalis pedis normal, L posterior tibial normal, and L dorsalis pedis normal.   Extremities:  no cyanosis, clubbing or edema   Diabetes Management Exam:    Foot Exam (with socks and/or shoes not present):        Sensory-Monofilament:          Left foot: normal          Right foot: normal   Impression & Recommendations:  Problem # 1:  HYPERTENSION (ICD-401.9) BP at goal. WIll cont same and check followings.  Her updated medication list for this problem includes:    Lasix 80 Mg Tabs (Furosemide) .[name]... Take 1/2  tablet by mouth once a day    Lotensin 40 Mg Tabs (Benazepril hcl) .[name]... Take 1 tablet by mouth once a day    Atenolol 100 Mg Tabs (Atenolol) .[name]... Take 1/2 pill daily by mouth.  Orders: T-Comprehensive Metabolic Panel [phone]) T-Lipid Profile [id number])  BP today: 132/69 Prior BP: 131/69 (07/25/2009)  Labs Reviewed: K+: 4.2 (06/06/2009) Creat: : 0.64 (06/06/2009)   Chol: 138 (12/27/2008)   HDL: 61 (12/27/2008)   LDL: 65 (12/27/2008)   TG: 59 (12/27/2008)  Problem # 2:  DYSLIPIDEMIA (ICD-272.4) Check followings. Tolerating statins fine.  Her updated medication list for this problem includes:    Pravachol 80 Mg Tabs (Pravastatin sodium) .[name]... Take 1 tablet by mouth once a day  Orders: T-Comprehensive Metabolic Panel [id number]) T-Lipid Profile [phone])  Labs Reviewed: SGOT: 12 (12/27/2008)   SGPT: 8 (12/27/2008)   HDL:61 (12/27/2008), 57 (05/03/2008)  LDL:65 (12/27/2008), 57 (05/03/2008)  Chol:138 (12/27/2008), 127 (05/03/2008)  Trig:59 (12/27/2008), 63 (05/03/2008)  Problem # 3:  DIABETES MELLITUS, TYPE II (ICD-250.00) Ms. Sadler's home CBGs run within goal. Her A1c is within goal. She is uptodate with her eye exam. Foot exam done today. Will cont same.  Her updated medication list for this problem includes:    Aspirin 81 Mg Tbec (Aspirin) .[name]... Take 1 tablet by mouth once a day    Lotensin 40 Mg Tabs (Benazepril hcl) .[name]... Take 1 tablet by mouth once a day    Glucophage 1000 Mg Tabs (Metformin hcl) .[name]... Take 1 tablet by mouth two times a day    Glucotrol 10 Mg Tabs (Glipizide) .[name]... Take 1 tablet by mouth two times a day  Orders: Capillary Blood  Glucose/CBG ([id number])  Labs Reviewed: Creat: 0.64 (06/06/2009)     Last Eye Exam: No diabetic retinopathy.    (01/02/2009) Reviewed HgBA1c results: 6.7 (07/25/2009)  7.1 (04/18/2009)  Problem # 4:  DEPRESSION (ICD-311) States her mood is usu good. Denies any SI/HI. Her updated medication list for this problem includes:    Paxil 30 Mg Tabs (Paroxetine hcl) .[name]... Take 2 tablets by mouth once a day  Complete Medication List: 1)  Aspirin 81 Mg Tbec (Aspirin) .... Take 1 tablet by mouth once a day 2)  Lasix 80 Mg Tabs (Furosemide) .... Take 1/2  tablet by mouth once a day 3)  Paxil 30 Mg Tabs (Paroxetine hcl) .... Take 2 tablets by mouth once a day 4)  Lotensin 40 Mg Tabs (Benazepril hcl) .... Take 1 tablet by mouth once a day 5)  Glucophage 1000 Mg Tabs (Metformin hcl) .... Take 1 tablet by mouth two times a day 6)  Glucotrol 10 Mg Tabs (Glipizide) .... Take 1 tablet by mouth two times a day 7)  Nitroquick Subl (Nitroglycerin subl) .... Use as directed 8)  Imdur 30 Mg Tb24 (Isosorbide mononitrate) .... Take 1 tablet by mouth once a day 9)  Pepcid 20 Mg Tabs (Famotidine) .... Take 1 tablet by mouth two times a day 10)  Pravachol 80 Mg Tabs (Pravastatin sodium) .... Take 1 tablet by mouth once a day 11)  Ferrous Sulfate 325 (65 Fe) Mg Tabs (Ferrous sulfate) .... Take 1 tablet by mouth two times a day 12)  Atenolol 100 Mg Tabs (Atenolol) .... Take 1/2 pill daily by mouth. 13)  Tylenol 325 Mg Tabs (Acetaminophen) .... Two tabs every eight hours as needed forpain. 14)  Oscal 500/200 D-3 500-200 Mg-unit Tabs (Calcium-vitamin d) .... Take 1 tablet by mouth three times a day 15)  Mag-ox 400 400 Mg Tabs (Magnesium oxide) .... Take 1 tablet by mouth two times a day 16)  Tylenol With Codeine #3 300-30 Mg Tabs (Acetaminophen-codeine) .... Take 1 tablet by mouth two times a day as needed for pain  Patient Instructions: 1)  Please schedule a follow-up appointment in 3 months. 2)  Limit your Sodium  (Salt) to less than 2 grams a day(slightly less than 1/2 a teaspoon) to prevent fluid retention, swelling, or worsening of symptoms. 3)  It is important that you exercise regularly at least 20 minutes 5 times a week. If you develop chest pain, have severe difficulty breathing, or feel very tired , stop exercising immediately and seek medical attention. 4)  You need to lose weight. Consider a lower calorie diet and regular exercise.  5)  Check your blood sugars regularly. If your readings are usually above : or below 70 you should contact our office. 6)  It is important that your Diabetic A1c level is checked every 3 months. 7)  See your eye doctor yearly to check for diabetic eye damage. 8)  Check your feet each night for sore areas, calluses or signs of infection. 9)  Check your Blood Pressure regularly. If it is above: you should make an appointment. Process Orders Check Orders Results:     [facility]: ABN not required for this insurance Tests Sent for requisitioning (October 16, 2009 6:07 AM):     10/10/2009: [facility] -- T-Comprehensive Metabolic Panel [[id number]] (signed)     10/10/2009: [facility] -- T-Lipid Profile [phone] (signed)    Prevention & Chronic Care Immunizations   Influenza vaccine: Fluvax 3+  (07/25/2009)    Tetanus booster: Not documented    Pneumococcal vaccine: Not documented    H. zoster vaccine: Not documented  Colorectal Screening   Hemoccult: Not documented    Colonoscopy:  Results: Hemorrhoids.       (01/14/2009)   Colonoscopy action/deferral: Repeat colonoscopy in 10 years.    (01/14/2009)   Colonoscopy due: 01/2019  Other Screening   Pap smear: Not documented  Mammogram: ASSESSMENT: Negative - BI-RADS 1^MM DIGITAL SCREENING  (06/05/2009)   Mammogram action/deferral: Screening mammogram in 1 year.     (06/04/2008)   Mammogram due: 06/2009    DXA bone density scan: L femoral neck  t  score -1.5 c/w osteopenia   (06/04/2008)   DXA scan due: 06/2010    Smoking status: never  (10/10/2009)  Diabetes Mellitus   HgbA1C: 6.7  (07/25/2009)    Eye exam: No diabetic retinopathy.     (01/02/2009)   Eye exam due: 01/2010    Foot exam: yes  (10/10/2009)   High risk foot: Not documented   Foot care education: Not documented    Urine microalbumin/creatinine ratio: 5.9  (07/25/2009)   Urine microalbumin action/deferral: Ordered    Diabetes flowsheet reviewed?: Yes   Progress toward A1C goal: Unchanged  Lipids   Total Cholesterol: 138  (12/27/2008)   LDL: 65  (12/27/2008)   LDL Direct: Not documented   HDL: 61  (12/27/2008)   Triglycerides: 59  (12/27/2008)    SGOT (AST): 12  (12/27/2008)   SGPT (ALT): 8  (12/27/2008) CMP ordered    Alkaline phosphatase: 83  (12/27/2008)   Total bilirubin: 0.4  (12/27/2008)    Lipid flowsheet reviewed?: Yes   Progress toward LDL goal: Unchanged  Hypertension   Last Blood Pressure: 132 / 69  (10/10/2009)   Serum creatinine: 0.64  (06/06/2009)   Serum potassium 4.2  (06/06/2009) CMP ordered     Hypertension flowsheet reviewed?: Yes   Progress toward BP goal: Unchanged  Self-Management Support :   Personal Goals (by the next clinic visit) :     Personal A1C goal: 7  (07/25/2009)     Personal blood pressure goal: 130/80  (07/25/2009)     Personal LDL goal: 100  (07/25/2009)    Patient will work on the following items until the next clinic visit to reach self-care goals:     Medications and monitoring: take my medicines every day, check my blood sugar, examine my feet every day  (10/10/2009)     Eating: drink diet soda or water instead of juice or soda, eat more vegetables, use fresh or frozen vegetables, eat foods that are low in salt, eat baked foods instead of fried foods, eat fruit for snacks and desserts, limit or avoid alcohol  (10/10/2009)     Activity: take a 30 minute walk every day  (10/10/2009)    Diabetes  self-management support: Written self-care plan  (10/10/2009)   Diabetes care plan printed   Last diabetes self-management training by diabetes educator: 04/18/2009    Hypertension self-management support: Written self-care plan  (10/10/2009)   Hypertension self-care plan printed.    Lipid self-management support: Written self-care plan  (10/10/2009)   Lipid self-care plan printed.  Last LDL:                                                 65 (12/27/2008 8:35:00 PM)        Diabetic Foot Exam Foot Inspection Is there a history of a foot ulcer?              No Is there a foot ulcer now?              No Can the patient see the bottom of their feet?  Yes Are the shoes appropriate in style and fit?          Yes Is there swelling or an abnormal foot shape?          No Are the toenails long?                No Are the toenails thick?                No Are the toenails ingrown?              No Is there heavy callous build-up?              No Is there a claw toe deformity?                          No Is there elevated skin temperature?            No Is there limited ankle dorsiflexion?            No Is there foot or ankle muscle weakness?            No Do you have pain in calf while walking?           No         10-g (5.07) Semmes-Weinstein Monofilament Test Performed by: Stanton Kidney Ditzler RN          Right Foot          Left Foot Visual Inspection     normal         normal Test Control      normal         normal Site 1         normal         normal Site 2         normal         normal Site 3         normal         normal Site 4         normal         normal Site 5         normal         normal Site 6         normal         normal Site 7         normal         normal Site 8         normal         normal Site 9         normal         normal Site 10         normal         normal  Impression      normal         normal   Process Orders Check Orders Results:      [facility]: ABN not required for this insurance Tests Sent for requisitioning (October 16, 2009 6:07 AM):     10/10/2009: [facility] -- T-Comprehensive Metabolic Panel [[id number]] (signed)     10/10/2009: [facility] -- T-Lipid Profile [phone] (signed)

## 2010-11-11 NOTE — Assessment & Plan Note (Signed)
Summary: FU VISIT/VS   Vital Signs:  Patient Profile:   65 Years Old Female Weight:      237.5 pounds (107.95 kg) Temp:     97.0 degrees F (36.11 degrees C) oral Pulse rate:   74 / minute BP sitting:   116 / 56  (right arm)  Pt. in pain?   yes    Location:   neck    Intensity:   10  Vitals Entered By: Stanton Kidney Ditzler RN (January 10, 2007 9:07 AM)              Is Patient Diabetic? Yes  Nutritional Status Normal Nutritional Status Detail good CBG Result 150  Have you ever been in a relationship where you felt threatened, hurt or afraid?denies   Does patient need assistance? Functional Status Self care Ambulation Impaired:Risk for fall Comments Uses cane.   PCP:  Ellie Lunch  Chief Complaint:  FU- doing well.  History of Present Illness: This is a 65 year old woman with a past medical history of Obesity, CAD, hyperlipidemia, DMII, hypertension, anemia who comes in for a one month follow-up on her blood pressure, edema and sob. She has not started cardiopulmonary rehab yet as she hasn't heard from them.  Her sob is pretty much unchanged. Her lower extremity edema is also unchanged.  She has never tried compression stockings before.    Prior Medications (reviewed today): ASPIRIN 81 MG TBEC (ASPIRIN) Take 1 tablet by mouth once a day LASIX 80 MG TABS (FUROSEMIDE) Take 11/2  tablets by mouth once a day PAXIL 30 MG TABS (PAROXETINE HCL) Take 2 tablets by mouth once a day LOTENSIN 40 MG TABS (BENAZEPRIL HCL) Take 1 tablet by mouth once a day GLUCOPHAGE 1000 MG TABS (METFORMIN HCL) Take 1 tablet by mouth two times a day GLUCOTROL 10 MG TABS (GLIPIZIDE) Take 1 tablet by mouth two times a day NITROQUICK  SUBL (NITROGLYCERIN SUBL) Use as directed IMDUR 30 MG TB24 (ISOSORBIDE MONONITRATE) Take 1 tablet by mouth once a day PEPCID 20 MG TABS (FAMOTIDINE) Take 1 tablet by mouth two times a day ACTOS 30 MG TABS (PIOGLITAZONE HCL) Take 1 tablet by mouth once a day NEURONTIN 300 MG CAPS  (GABAPENTIN) Take 1 tablet by mouth once a day PRAVACHOL 80 MG TABS (PRAVASTATIN SODIUM) Take 1 tablet by mouth once a day FERROUS SULFATE 325 (65 FE) MG TABS (FERROUS SULFATE) Take 1 tablet by mouth two times a day ATENOLOL 100 MG TABS (ATENOLOL) Take 1/2 pill daily by mouth. Current Allergies (reviewed today): No known allergies     Risk Factors: Tobacco use:  never Alcohol use:  no Exercise:  yes    Times per week:  one day    Type:  walking Seatbelt use:  100 %    Physical Exam  General:     alert, well-developed, and overweight-appearing.   Neck:     supple.  Stiffness on left side (since this morning) Lungs:     normal respiratory effort and normal breath sounds.   Heart:     normal rate, regular rhythm, no murmur, no gallop, and no rub.   Extremities:     2+ left pedal edema and 2+ right pedal edema.      Impression & Recommendations:  Problem # 1:  LEG EDEMA (ICD-782.3) Suggested to wear compression stockings.  Written script givem Her updated medication list for this problem includes:    Lasix 80 Mg Tabs (Furosemide) .[name]... Take 11/2  tablets  by mouth once a day   Problem # 2:  HYPERTENSION (ICD-401.9) Well controlled on new regimen Her updated medication list for this problem includes:    Lasix 80 Mg Tabs (Furosemide) .[name]... Take 11/2  tablets by mouth once a day    Lotensin 40 Mg Tabs (Benazepril hcl) .[name]... Take 1 tablet by mouth once a day    Atenolol 100 Mg Tabs (Atenolol) .[name]... Take 1/2 pill daily by mouth.   Problem # 3:  DYSPNEA ON EXERTION (ICD-786.09) Referral was not received at the cardiopulmonary rehab.  Made today. Her updated medication list for this problem includes:    Lasix 80 Mg Tabs (Furosemide) .[name]... Take 11/2  tablets by mouth once a day    Lotensin 40 Mg Tabs (Benazepril hcl) .[name]... Take 1 tablet by mouth once a day    Atenolol 100 Mg Tabs (Atenolol) .[name]... Take 1/2 pill daily by mouth.   Other Orders: Capillary Blood Glucose  (82948) Fingerstick (17616)   Patient Instructions: 1)  Please schedule a follow-up appointment in 3 months. 2)  Keep your legs elevated, and wear the compression stockings for your leg swelling.  If it is too tight around your thighs and is making your legs numb, then call us to get knee-high stockings instead. 3)  Limit your Sodium (Salt).  Appended Document: coding    Clinical Lists Changes  Orders: Added new Service order of Est. Patient Level II (07371) - Signed

## 2010-11-11 NOTE — Letter (Signed)
Summary: [facility]  [employer]   Imported By: Florinda Marker 03/13/2009 14:27:04  _____________________________________________________________________  External Attachment:    Type:   Image     Comment:   External Document

## 2010-11-11 NOTE — Progress Notes (Signed)
Summary: refill/ hla  Phone Note Refill Request Message from:  Fax from Pharmacy on September 30, 2007 2:36 PM  Refills Requested: Medication #1:  PAXIL 30 MG TABS Take 2 tablets by mouth once a day   Last Refilled: 11/21 Initial call taken by: Marin Roberts RN,  September 30, 2007 2:36 PM  Follow-up for Phone Call        Refill approved-nurse to complete Follow-up by: Ulyess Mort MD,  September 30, 2007 2:42 PM      Prescriptions: PAXIL 30 MG TABS (PAROXETINE HCL) Take 2 tablets by mouth once a day  #60 x 3   Entered and Authorized by:   Ulyess Mort MD   Signed by:   Ulyess Mort MD on 09/30/2007   Method used:   Telephoned to ...       [id number]       [address]       [place], [place]  [address]       Ph: [phone] or [phone]       Fax: [phone]   RxID:   [id number]

## 2010-11-11 NOTE — Assessment & Plan Note (Signed)
Summary: dm training/vs   Vital Signs:  Patient profile:   65 year old female Weight:      241.8 pounds BMI:     41.65   [HPI-ROS-CCC]  Problems Prior to Update: 1)  Dermatitis  (ICD-692.9) 2)  Hypomagnesemia  (ICD-275.2) 3)  Diverticulosis of Colon  (ICD-562.10) 4)  Neck Pain  (ICD-723.1) 5)  Osteopenia  (ICD-733.90) 6)  Health Screening  (ICD-V70.0) 7)  Arm Pain, Right  (ICD-729.5) 8)  Other Screening Mammogram  (ICD-V76.12) 9)  Dm W/neuro Manifestations, Type II  (ICD-250.60) 10)  Hypokalemia  (ICD-276.8) 11)  Hx of Tachycardia, Paroxysmal Nos  (ICD-427.2) 12)  Leg Edema  (ICD-782.3) 13)  Dyslipidemia  (ICD-272.4) 14)  Obesity  (ICD-278.00) 15)  Cad  (ICD-414.00) 16)  Diabetes Mellitus, Type II  (ICD-250.00) 17)  Hypertension  (ICD-401.9) 18)  Dyspnea On Exertion  (ICD-786.09) 19)  Anemia, Iron Deficiency  (ICD-280.9) 20)  Postmenopausal Bleeding  (ICD-627.1) 21)  Degenerative Joint Disease  (ICD-715.90) 22)  Spinal Stenosis, Lumbar  (ICD-724.02) 23)  Gerd  (ICD-530.81) 24)  Depression  (ICD-311) 25)  Glaucoma Nos  (ICD-365.9)  Medications Prior to Update: 1)  Aspirin 81 Mg Tbec (Aspirin) .... Take 1 Tablet By Mouth Once A Day 2)  Lasix 80 Mg Tabs (Furosemide) .... Take 1/2  Tablet By Mouth Once A Day 3)  Paxil 30 Mg Tabs (Paroxetine Hcl) .... Take 2 Tablets By Mouth Once A Day 4)  Lotensin 40 Mg Tabs (Benazepril Hcl) .... Take 1 Tablet By Mouth Once A Day 5)  Glucophage 1000 Mg Tabs (Metformin Hcl) .... Take 1 Tablet By Mouth Two Times A Day 6)  Glucotrol 10 Mg Tabs (Glipizide) .... Take 1 Tablet By Mouth Two Times A Day 7)  Nitroquick  Subl (Nitroglycerin Subl) .... Use As Directed 8)  Imdur 30 Mg Tb24 (Isosorbide Mononitrate) .... Take 1 Tablet By Mouth Once A Day 9)  Pepcid 20 Mg Tabs (Famotidine) .... Take 1 Tablet By Mouth Two Times A Day 10)  Actos 30 Mg Tabs (Pioglitazone Hcl) .... Take 1 Tablet By Mouth Once A Day 11)  Pravachol 80 Mg Tabs (Pravastatin  Sodium) .... Take 1 Tablet By Mouth Once A Day 12)  Ferrous Sulfate 325 (65 Fe) Mg Tabs (Ferrous Sulfate) .... Take 1 Tablet By Mouth Two Times A Day 13)  Atenolol 100 Mg Tabs (Atenolol) .... Take 1/2 Pill Daily By Mouth. 14)  Tylenol Ex St Arthritis Pain 500 Mg Tabs (Acetaminophen) .... Two Tabs Every Eight Hours As Needed For Pain. 15)  Oscal 500/200 D-3 500-200 Mg-Unit Tabs (Calcium-Vitamin D) .... Take 1 Tablet By Mouth Three Times A Day 16)  Slow-Mag 535 (64 Mg) Mg Cr-Tabs (Magnesium Chloride) .... Take 1 Tablet By Mouth Two Times A Day 17)  Miralax   Powd (Polyethylene Glycol 3350) .... As Per Prep  Instructions. 18)  Dulcolax 5 Mg  Tbec (Bisacodyl) .... Day Before Procedure Take 2 At 3pm and 2 At 8pm. 19)  Reglan 10 Mg  Tabs (Metoclopramide Hcl) .... As Per Prep Instructions.  Allergies: No Known Drug Allergies   Complete Medication List: 1)  Aspirin 81 Mg Tbec (Aspirin) .... Take 1 tablet by mouth once a day 2)  Lasix 80 Mg Tabs (Furosemide) .... Take 1/2  tablet by mouth once a day 3)  Paxil 30 Mg Tabs (Paroxetine hcl) .... Take 2 tablets by mouth once a day 4)  Lotensin 40 Mg Tabs (Benazepril hcl) .... Take 1 tablet by mouth  once a day 5)  Glucophage 1000 Mg Tabs (Metformin hcl) .... Take 1 tablet by mouth two times a day 6)  Glucotrol 10 Mg Tabs (Glipizide) .... Take 1 tablet by mouth two times a day 7)  Nitroquick Subl (Nitroglycerin subl) .... Use as directed 8)  Imdur 30 Mg Tb24 (Isosorbide mononitrate) .... Take 1 tablet by mouth once a day 9)  Pepcid 20 Mg Tabs (Famotidine) .... Take 1 tablet by mouth two times a day 10)  Actos 30 Mg Tabs (Pioglitazone hcl) .... Take 1 tablet by mouth once a day 11)  Pravachol 80 Mg Tabs (Pravastatin sodium) .... Take 1 tablet by mouth once a day 12)  Ferrous Sulfate 325 (65 Fe) Mg Tabs (Ferrous sulfate) .... Take 1 tablet by mouth two times a day 13)  Atenolol 100 Mg Tabs (Atenolol) .... Take 1/2 pill daily by mouth. 14)  Tylenol Ex St  Arthritis Pain 500 Mg Tabs (Acetaminophen) .... Two tabs every eight hours as needed for pain. 15)  Oscal 500/200 D-3 500-200 Mg-unit Tabs (Calcium-vitamin d) .... Take 1 tablet by mouth three times a day 16)  Slow-mag 535 (64 Mg) Mg Cr-tabs (Magnesium chloride) .... Take 1 tablet by mouth two times a day 17)  Miralax Powd (Polyethylene glycol 3350) .... As per prep  instructions. 18)  Dulcolax 5 Mg Tbec (Bisacodyl) .... Day before procedure take 2 at 3pm and 2 at 8pm. 19)  Reglan 10 Mg Tabs (Metoclopramide hcl) .... As per prep instructions.  Other Orders: DSMT (Medicaid) 60 Minutes [id number])   Diabetes Self Management Training  PCP:  Joaquin Courts  MD Diabetes Type: Type 2 non-insulin treated Current smoking Status: never  Vital Signs Todays Weight: 241.8lb  in   BMI 41.65in-lbs   Assessment Type of Work: homemaker Daily activities: very little- tired all the time, thinks she had a sleep study and remembers that they told her she snored. Does not have cpap, does not sleep much or well.- see [place] screening questionairre for sleep apnea Sources of Support: friend Ok Edwards with her and grandson. Concerned about her daighters who are very  overweight also. Years of school completed: 6th grade Knowledge Deficits: knows vry little about diabetes n general, but does verbalize healthy food choices Special needs or Barriers: illiterate # of people in household: 3  Potential Barriers  Transportation  Unable to care for self  Economic/Supplies  Low Literacy  Coping with Diabetes Feelings about Diabetes: Acceptance Would you say you are: happy What bothers you  most about dealing with your diabetes? nothing How often do you feel sad, tired, low interest in life and things you used to enjoy? sometimes  Diabetes Medications:  Comments: take medications    Monitoring Measures urine ketones? No Wears Medical I.D. No      Recent Episodes of: Hyperglycemia : No  Hypoglycemia: No Severe Hypoglycemia : No      Activity Limitations  Inadequate physical activity  Diabetes Disease Process Define diabetes in simple terms: No knowledge State own type of diabetes: No knowledge State diabetes is treated by meal plan-exercise-medication-monitoring-education: No knowledge  Medications State name-action-dose-duration-side effects-and time to take medication: Needs review/assistance State appropriate timing of food related to medication: Demonstrates competency Demonstrates/verbalizes site selection and rotation for injections Not applicable Correctly draw up and administer insulin-[place]-Symlin-glucagon: Not applicable State insulin adjustment guidelines: Not applicable  Nutritional Management Identify what foods most often affect blood glucose: No knowledge Verbalize importance of controlling food portions: Needs review/assistance State importance  of spacing and not omitting meals and snacks: Needs review/assistance State changes planned for home meals/snacks: Demonstrates competency  Monitoring State purpose and frequency of monitoring BG-ketones-HgbA1C and when to contact health care team with results: Needs review/assistance State target blood glucose and HgbA1C goals: Needs review/assistance  Complications State the causes-signs and symptoms and prevention of Hyperglycemia: Needs review/assistance Explain proper treatment of hyperglycemia: Needs review/assistance State the causes- signs and symptoms and prevention of hypoglycemia: Needs review/assistance Explain proper treatment of hypoglycemia: Needs review/assistance  Exercise States importance of exercise: Demonstrates competency States effect of exercise on blood glucose: Demonstrates competency Verbalizes safety measures for exercise related to diabetes: Needs review/assistance  Psychosocial Adjustment Diabetes Management Education Done: 01/15/2009 Date of Diabetic Education:  01/15/2009    BEHAVIORAL GOALS INITIAL Incorporating physical activity into lifestyle: try to take several 5 minute walks every day Incorporating appropriate nutritional management: fill half my plate with bvegetables to decrease my weight and help me eat less        Recommend sleep evalutaion if not done previously as patient scored " high risk " on [place] sleep questionairre.  Used picture book- Living with Diabetes and food models to educate patient about portion sizes and plate method of meal planning  Diabetes Self Management Support: clinic staff, gave numbers for senior resource center for patient to participate in activities to encourage movement Follow-up: 2 weeks to 1 month  Last LDL:                                                 65 (12/27/2008 8:35:00 PM)

## 2010-11-11 NOTE — Assessment & Plan Note (Signed)
Summary: FLU SHOT/CH  Nurse Visit    Prior Medications: ASPIRIN 81 MG TBEC (ASPIRIN) Take 1 tablet by mouth once a day LASIX 80 MG TABS (FUROSEMIDE) Take 1and 1/2  tablets by mouth once a day PAXIL 30 MG TABS (PAROXETINE HCL) Take 2 tablets by mouth once a day LOTENSIN 40 MG TABS (BENAZEPRIL HCL) Take 1 tablet by mouth once a day GLUCOPHAGE 1000 MG TABS (METFORMIN HCL) Take 1 tablet by mouth two times a day GLUCOTROL 10 MG TABS (GLIPIZIDE) Take 1 tablet by mouth two times a day NITROQUICK  SUBL (NITROGLYCERIN SUBL) Use as directed IMDUR 30 MG TB24 (ISOSORBIDE MONONITRATE) Take 1 tablet by mouth once a day PEPCID 20 MG TABS (FAMOTIDINE) Take 1 tablet by mouth two times a day ACTOS 30 MG TABS (PIOGLITAZONE HCL) Take 1 tablet by mouth once a day PRAVACHOL 80 MG TABS (PRAVASTATIN SODIUM) Take 1 tablet by mouth once a day FERROUS SULFATE 325 (65 FE) MG TABS (FERROUS SULFATE) Take 1 tablet by mouth two times a day ATENOLOL 100 MG TABS (ATENOLOL) Take 1/2 pill daily by mouth. MAGNESIUM OXIDE 400 MG  TABS (MAGNESIUM OXIDE) Take 1 tablet by mouth twice daily for low magnesium. TYLENOL EX ST ARTHRITIS PAIN 500 MG TABS (ACETAMINOPHEN) Two tabs every eight hours as needed for pain. OSCAL 500/200 D-3 500-200 MG-UNIT TABS (CALCIUM-VITAMIN D) Take 1 tablet by mouth three times a day Current Allergies: No known allergies  Flu Vaccine Consent Questions     Do you have a history of severe allergic reactions to this vaccine? no    Any prior history of allergic reactions to egg and/or gelatin? no    Do you have a sensitivity to the preservative Thimersol? no    Do you have a past history of Guillan-Barre Syndrome? no    Do you currently have an acute febrile illness? no    Have you ever had a severe reaction to latex? no    Vaccine information given and explained to patient? yes    Are you currently pregnant? no    Lot Number:[id number]   Exp Date:04/10/2009   Site Given  Left Deltoid IM Merrie Roof RN  August 28, 2008 9:38 AM .opcflu    Orders Added: 1)  Admin 1st Vaccine [90471] 2)  Flu Vaccine 74yrs + [place].Dew    ]

## 2010-11-11 NOTE — Letter (Signed)
Summary: BLOOD GLUCOSE REPORT  BLOOD GLUCOSE REPORT   Imported By: Margie Billet 03/21/2010 08:30:18  _____________________________________________________________________  External Attachment:    Type:   Image     Comment:   External Document

## 2010-11-11 NOTE — Progress Notes (Signed)
Summary: Refill/gh  Phone Note Refill Request Message from:  Pharmacy on August 22, 2007 9:22 AM  Refills Requested: Medication #1:  ACTOS 30 MG TABS Take 1 tablet by mouth once a day   Last Refilled: 07/16/2007  Method Requested: Electronic Initial call taken by: Angelina Ok RN,  August 22, 2007 9:22 AM  Follow-up for Phone Call        Please call this pt and schedule her for an appt with her PCP (Dr Maryland Pink) in next available. I refilled her Actos. Follow-up by: Ned Grace MD,  August 22, 2007 9:52 AM  Additional Follow-up for Phone Call Additional follow up Details #1::        Rx faxed to pharmacy Additional Follow-up by: Concepcion Elk,  August 22, 2007 5:18 PM      Prescriptions: ACTOS 30 MG TABS (PIOGLITAZONE HCL) Take 1 tablet by mouth once a day  #30 x 3   Entered and Authorized by:   Ned Grace MD   Signed by:   Ned Grace MD on 08/22/2007   Method used:   Electronically sent to ...       [facility] [id number]       [address]       [place], [place]  [address]       Ph: [phone] or [phone]       Fax: [phone]   RxID:   [id number]

## 2010-11-11 NOTE — Miscellaneous (Signed)
Summary: [facility]   Imported By: Margie Billet 03/18/2010 11:57:17  _____________________________________________________________________  External Attachment:    Type:   Image     Comment:   External Document

## 2010-11-11 NOTE — Progress Notes (Signed)
Summary: med refill/gp  Phone Note Refill Request Message from:  Fax from Pharmacy on December 06, 2009 9:55 AM  Refills Requested: Medication #1:  TYLENOL WITH CODEINE #3 300-30 MG TABS Take 1 tablet by mouth two times a day as needed for pain.   Last Refilled: 08/16/2009  Method Requested: Telephone to Pharmacy Initial call taken by: Chinita Pester RN,  December 06, 2009 9:55 AM  Follow-up for Phone Call        Refill approved-nurse to complete Follow-up by: Joaquin Courts  MD,  December 08, 2009 12:39 PM  Additional Follow-up for Phone Call Additional follow up Details #1::        Rx called to pharmacy - [facility]  [address] Additional Follow-up by: Chinita Pester RN,  December 09, 2009 10:23 AM    Prescriptions: TYLENOL WITH CODEINE #3 300-30 MG TABS (ACETAMINOPHEN-CODEINE) Take 1 tablet by mouth two times a day as needed for pain  #60 x 3   Entered and Authorized by:   Joaquin Courts  MD   Signed by:   Joaquin Courts  MD on 12/08/2009   Method used:   Telephoned to ...       [facility] (retail)       [address]       [place], [place]  [id number]       Ph: [phone]       Fax: [phone]   RxID:   [id number]

## 2010-11-11 NOTE — Progress Notes (Signed)
Summary: med refill/wl  Phone Note Refill Request Message from:  Fax from Pharmacy on November 02, 2006 4:38 PM  Refills Requested: Medication #1:  LASIX 80 MG TABS Take 11/2  tablets by mouth once a day   Dosage confirmed as above?Dosage Confirmed   Last Refilled: 09/11/2006   Notes: last CMET 7/07  Method Requested: Fax to Local Pharmacy Initial call taken by: Dorene Sorrow RN,  November 02, 2006 4:40 PM  Follow-up for Phone Call Details for Follow-up Action Taken: See order for BMET in one mth. Please schedule appt with MD within next mth for F/U  Refill approved-nurse to complete Follow-up by: Ellie Lunch MD,  November 02, 2006 5:09 PM  Additional Follow-up for Phone Call Details for Additional Follow-up Action Taken: Requested appt. for pt.  Rx faxed to pharmacy Additional Follow-up by: Dorene Sorrow RN,  November 04, 2006 10:10 AM   Prescriptions: LASIX 80 MG TABS (FUROSEMIDE) Take 11/2  tablets by mouth once a day  #45 x 2   Entered and Authorized by:   Ellie Lunch MD   Signed by:   Ellie Lunch MD on 11/02/2006   Method used:   Telephoned to ...         RxID:   [id number]

## 2010-11-11 NOTE — Miscellaneous (Signed)
Summary: [facility]: Verbal Order  [facility]: Verbal Order   Imported By: Florinda Marker 12/06/2009 15:55:26  _____________________________________________________________________  External Attachment:    Type:   Image     Comment:   External Document

## 2010-11-11 NOTE — Assessment & Plan Note (Signed)
Summary: EST-CK/FU/MED REFILLS PER DR KIRK/CFB   Vital Signs:  Patient Profile:   65 Years Old Female Height:     64 inches (162.56 cm) Weight:      236.2 pounds (107.36 kg) BMI:     40.69 Temp:     97.1 degrees F (36.17 degrees C) oral Pulse rate:   72 / minute BP sitting:   117 / 66  (left arm) Cuff size:   large  Pt. in pain?   no  Vitals Entered By: Theotis Barrio (October 19, 2007 11:14 AM)              Is Patient Diabetic? Yes  Nutritional Status NORMAL CBG Result 157  Have you ever been in a relationship where you felt threatened, hurt or afraid?No   Does patient need assistance? Functional Status Self care Ambulation Normal     PCP:  Madelaine Etienne MD  Chief Complaint:  MEDICATION REFILL / ROUTINE CHECK UP.  History of Present Illness: 65 y/o AAF with h/o HTN, DM2, DOE s/p extensive workup, CAD, morbid obesity, spinal stenosis, Fe-def anemia presents for f/u.  The pt is also being followed by Dr. Riley Kill [facility]; next appt later this month) and Dr. Marikay Alar (Ortho; saw last Mon, next appt in  ~3 mos).  Today, the pt has no complaints.  Feels she has been doing well since her last visit.  Acknowledges some weight gain, but attributes this to inactivity s/p lumbar spine surgery last October.  Otherwise, feels okay.  Has baseline DOE, but cardiac workup has been done and was unrevealing.  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    Denies any CP, palpitations, worsening dyspnea, N/V/D, constipation. No fevers, chills, or back/leg pain.  Wants to return to rehab when cleared by Dr. Yetta Barre, as she feels this helped her breathing and will help her lose some weight.  Endorses some fluctuating lower ext edema, which she says is better than usual today.  Current Allergies: No known allergies   Past Surgical History:    Posterior lumbar interfusion surgery at L2-L3  (Dr. Marikay Alar), 07/18/07    Lumbar fusion surgery at L3-L5 (s/p hardware removal in 10/08 on re-exploration), c. 2006    Rotator cuff repair, date unknown    Risk Factors: Tobacco use:  never Alcohol use:  no Exercise:  yes    Times per week:  one day    Type:  walking Seatbelt use:  100 %  Family History Risk Factors:    Family History of MI in females < 51 years old:  no    Family History of MI in males < 34 years old:  no   Review of Systems       The patient complains of dyspnea on exhertion, peripheral edema, and difficulty walking.  The patient denies anorexia, fever, weight loss, vision loss, chest pain, syncope, prolonged cough, hemoptysis, abdominal pain, melena, hematochezia, severe indigestion/heartburn, hematuria, incontinence, and muscle weakness.     Physical Exam  General:     Alert, morbidly obese African-American woman in no acute distress. [name] [name]:     Normocephalic and atraumatic. Eyes:     Bespectacled.  PERRLA.  EOMI.  No scleral icterus or conjunctival injection. Mouth:     Fair dentition.  Oropharynx pink and moist. Neck:     Supple without LAD or TM. Lungs:  Distant breath sounds 2/2 habitus, but no wheezes, rales, or rhonchi appreciated. Heart:     Distant heart sounds 2/2 habitus.  Regular rate and rhythm without appreciable murmurs, rubs, or gallops. Abdomen:     Obese, soft, non-tender, non-distended.  Normoactive bowel sounds. Pulses:     Radial and carotid pulses 2+ and symmetric bilaterally.  Posterior tibial and dorsalis pedis pulses 1+ and symmetric bilaterally. Extremities:     1+ bilateral lower extremity edema, L marginally worse than R.  Faint pitting bilaterally.  See full diabetic foot exam, but pt does have some nail discoloration (digits 2-3 on R foot) and a small callus on digit 2 of L foot.  No open/weeping wounds seen. Neurologic:     A&Ox3.  CN grossly intact.  Sensation intact throughout, including over the bilateral legs  and feet. Psych:     Good eye contact.  Appropriate and cooperative.    Impression & Recommendations:  Problem # 1:  DM W/NEURO MANIFESTATIONS, TYPE II (ICD-250.60) HgbA1c slightly worse (7.3) today than it was 6 months ago (7.1).  Pt reports adherence with all of her medicines.  May benefit from a meeting with Jamison Neighbor when the pt returns for f/u at the end of the month.  Her updated medication list for this problem includes:    Aspirin 81 Mg Tbec (Aspirin) .[name]... Take 1 tablet by mouth once a day    Lotensin 40 Mg Tabs (Benazepril hcl) .[name]... Take 1 tablet by mouth once a day    Glucophage 1000 Mg Tabs (Metformin hcl) .[name]... Take 1 tablet by mouth two times a day    Glucotrol 10 Mg Tabs (Glipizide) .[name]... Take 1 tablet by mouth two times a day    Actos 30 Mg Tabs (Pioglitazone hcl) .[name]... Take 1 tablet by mouth once a day  Orders: T-CBC w/Diff ([id number])  Labs Reviewed: HgBA1c: 7.3 (10/19/2007)   Creat: 0.7 (06/14/2007)      Problem # 2:  HYPERTENSION (ICD-401.9) BP continues to be excellent on medical therapy.  Continue current regimen.  Her updated medication list for this problem includes:    Lasix 80 Mg Tabs (Furosemide) .[name]... Take 1and 1/2  tablets by mouth once a day    Lotensin 40 Mg Tabs (Benazepril hcl) .[name]... Take 1 tablet by mouth once a day    Atenolol 100 Mg Tabs (Atenolol) .[name]... Take 1/2 pill daily by mouth.  Orders: T-Comprehensive Metabolic Panel [id number]) T-CBC w/Diff ([id number]) T-Magnesium [phone])  BP today: 117/66 Prior BP: 105/51 (05/03/2007)  Labs Reviewed: Creat: 0.7 (06/14/2007)   Problem # 3:  DYSLIPIDEMIA (ICD-272.4) Will check FLP prior to next f/u appt at the end of this month.  Her updated medication list for this problem includes:    Pravachol 80 Mg Tabs (Pravastatin sodium) .[name]... Take 1 tablet by mouth once a day  Labs Reviewed: SGOT: 13 (05/03/2007)   SGPT: 12 (05/03/2007)   Problem # 4:  OBESITY (ICD-278.00) An  ongoing problem.  Has gained a few pounds since her last visit, but pt attributes this to being hospitalized in October for lumbar spine surgery and, consequently, to not going to cardiac rehab thereafter.  Pt would like to return to rehab ASAP, as she feels this is very beneficial for her stable dyspnea-on-exertion.  Problem # 5:  CAD (ICD-414.00) Pt follows with Dr. Riley Kill of [facility].  Prior workups unrevealing.  Continue medical mgmt.  Her updated medication list for this problem includes:    Aspirin 81 Mg  Tbec (Aspirin) .[name]... Take 1 tablet by mouth once a day    Lasix 80 Mg Tabs (Furosemide) .[name]... Take 1and 1/2  tablets by mouth once a day    Lotensin 40 Mg Tabs (Benazepril hcl) .[name]... Take 1 tablet by mouth once a day    Nitroquick Subl (Nitroglycerin subl) ..... Use as directed    Imdur 30 Mg Tb24 (Isosorbide mononitrate) .[name]... Take 1 tablet by mouth once a day    Atenolol 100 Mg Tabs (Atenolol) .[name]... Take 1/2 pill daily by mouth.   Problem # 6:  SPINAL STENOSIS, LUMBAR (ICD-724.02) S/p lumbar fusion surgery in 10/08.  Pt doing well - follows with Dr. Marikay Alar.  Problem # 7:  LEG EDEMA (ICD-782.3) Pt has been on a stable dose of 120mg  daily of Lasix for quite some time now.  This LE edema seems to come and go, and I am not convinced that it is affected much (if at all) by the Lasix.  After d/w Dr. Harvie Junior today, I elected to write the pt for a pair of compression stockings to see if this will help with the edema and allow [place] to wean (or stop) her Lasix.  Will f/u in [facility] later this month to see if the stockings have helped.  Her updated medication list for this problem includes:    Lasix 80 Mg Tabs (Furosemide) .[name]... Take 1and 1/2  tablets by mouth once a day   Complete Medication List: 1)  Aspirin 81 Mg Tbec (Aspirin) .... Take 1 tablet by mouth once a day 2)  Lasix 80 Mg Tabs (Furosemide) .... Take 1and 1/2  tablets by mouth once a day 3)  Paxil 30 Mg Tabs (Paroxetine hcl) .... Take 2  tablets by mouth once a day 4)  Lotensin 40 Mg Tabs (Benazepril hcl) .... Take 1 tablet by mouth once a day 5)  Glucophage 1000 Mg Tabs (Metformin hcl) .... Take 1 tablet by mouth two times a day 6)  Glucotrol 10 Mg Tabs (Glipizide) .... Take 1 tablet by mouth two times a day 7)  Nitroquick Subl (Nitroglycerin subl) .... Use as directed 8)  Imdur 30 Mg Tb24 (Isosorbide mononitrate) .... Take 1 tablet by mouth once a day 9)  Pepcid 20 Mg Tabs (Famotidine) .... Take 1 tablet by mouth two times a day 10)  Actos 30 Mg Tabs (Pioglitazone hcl) .... Take 1 tablet by mouth once a day 11)  Neurontin 300 Mg Caps (Gabapentin) .... Take 1 tablet by mouth once a day 12)  Pravachol 80 Mg Tabs (Pravastatin sodium) .... Take 1 tablet by mouth once a day 13)  Ferrous Sulfate 325 (65 Fe) Mg Tabs (Ferrous sulfate) .... Take 1 tablet by mouth two times a day 14)  Atenolol 100 Mg Tabs (Atenolol) .... Take 1/2 pill daily by mouth. 15)  Slow-mag 64 Mg Tbcr (Magnesium chloride) .... Take 1 tablet by mouth two times a day 16)  Amitriptyline Hcl 25 Mg Tabs (Amitriptyline hcl) .... Take 1 tablet by mouth 30 minutes before bedtime.  Other Orders: T- Capillary Blood Glucose ([id number]) T-Hgb A1C (in-house) ([id number])  Future Orders: Mammogram (Mammogram) ... 01/11/2008   Patient Instructions: 1)  Please make a follow-up appointment in 3 weeks (the last week in January). 2)  Prior to your appointment, please come in a few minutes early to have your cholesterol level checked.  Fasting lipid profile, ICD-9: 272.4. 3)  Please pick up your compression stockings as directed today.  If you have  any problems/concerns about them, please call our office. 4)  As we discussed today, please call Dr. Yetta Barre' office and ask when it is okay for you to return to Cardiac Rehab.  Doing so will help you lose weight. 5)  You will need your yearly screening mammogram in April 2009.    Prescriptions: SLOW-MAG 64 MG  TBCR (MAGNESIUM  CHLORIDE) Take 1 tablet by mouth two times a day  #180 x 2   Entered and Authorized by:   Madelaine Etienne MD   Signed by:   Madelaine Etienne MD on 10/19/2007   Method used:   Electronically sent to ...       [id number]       [address] [place]  [address]       [place], [place]  [address]       Ph: [phone] or [phone]       Fax: [phone]   RxID:   [id number] ATENOLOL 100 MG TABS (ATENOLOL) Take 1/2 pill daily by mouth.  #30 x 5   Entered and Authorized by:   Madelaine Etienne MD   Signed by:   Madelaine Etienne MD on 10/19/2007   Method used:   Electronically sent to ...       [id number]       [address] [place]  [address]       [place], [place]  [address]       Ph: [phone] or [phone]       Fax: [phone]   RxID:   [id number] FERROUS SULFATE 325 (65 FE) MG TABS (FERROUS SULFATE) Take 1 tablet by mouth two times a day  #62 x 5   Entered and Authorized by:   Madelaine Etienne MD   Signed by:   Madelaine Etienne MD on 10/19/2007   Method used:   Electronically sent to ...       [id number]       [address] [place]  [address]       [place], [place]  [address]       Ph: [phone] or [phone]       Fax: [phone]   RxID:   [id number] PRAVACHOL 80 MG TABS (PRAVASTATIN SODIUM) Take 1 tablet by mouth once a day  #31 x 5   Entered and Authorized by:   Madelaine Etienne MD   Signed by:   Madelaine Etienne MD on 10/19/2007   Method used:   Electronically sent to ...       [id number]       [address] [place]       [place], [place]  [address]       Ph: [phone] or [phone]       Fax: [phone]   RxID:   [id number] NEURONTIN 300 MG CAPS (GABAPENTIN) Take 1 tablet by mouth once a day  #31 x 5   Entered and Authorized by:   Madelaine Etienne MD   Signed by:   Madelaine Etienne MD on 10/19/2007   Method used:   Electronically sent to ...        [id number]       [address]  [address]       [place], [place]  [address]       Ph: [phone] or [phone]       Fax: [phone]   RxID:   [id number] ACTOS 30 MG TABS (PIOGLITAZONE HCL) Take 1 tablet by mouth once a day  #30 x 5   Entered and Authorized by:   Madelaine Etienne MD   Signed by:   Madelaine Etienne MD on 10/19/2007   Method used:   Electronically sent to ...       [id number]       [address] [place]  [address]       [place], [place]  [address]       Ph: [phone] or [phone]       Fax: [phone]   RxID:   [id number] PEPCID 20 MG TABS (FAMOTIDINE) Take 1 tablet by mouth two times a day  #60 x 5   Entered and Authorized by:   Madelaine Etienne MD   Signed by:   Madelaine Etienne MD on 10/19/2007   Method used:   Electronically sent to ...       [id number]       [address] [place]  [address]       [place], [place]  [address]       Ph: [phone] or [phone]       Fax: [phone]   RxID:   [id number] IMDUR 30 MG TB24 (ISOSORBIDE MONONITRATE) Take 1 tablet by mouth once a day  #30 x 5   Entered and Authorized by:   Madelaine Etienne MD   Signed by:   Madelaine Etienne MD on 10/19/2007   Method used:   Electronically sent to ...       [id number]       [address] [place]  [address]       [place], [place]  [address]       Ph: [phone] or [phone]       Fax: [phone]   RxID:   [id number] GLUCOTROL 10 MG TABS (GLIPIZIDE) Take 1 tablet by mouth two times a day  #60 x 5   Entered and Authorized by:   Madelaine Etienne MD   Signed by:   Madelaine Etienne MD on 10/19/2007   Method used:   Electronically sent to ...       [id number]       [address] [place]  [address]       [place], [place]  [address]       Ph: [phone] or [phone]       Fax: [phone]   RxID:   [id number]  GLUCOPHAGE 1000 MG TABS (METFORMIN HCL) Take 1 tablet by mouth two times a day  #62 x 5   Entered and Authorized by:   Madelaine Etienne MD   Signed by:   Madelaine Etienne MD on 10/19/2007   Method used:   Electronically sent to ...       [id number]       [address] [address]  [address]  [place], [place]  [address]       Ph: [phone] or [phone]       Fax: [phone]   RxID:   [id number] LOTENSIN 40 MG TABS (BENAZEPRIL HCL) Take 1 tablet by mouth once a day  #31 x 5   Entered and Authorized by:   Madelaine Etienne MD   Signed by:   Madelaine Etienne MD on 10/19/2007   Method used:   Electronically sent to ...       [id number]       [address]       [place], [place]  [address]       Ph: [phone] or [phone]       Fax: [phone]   RxID:   [id number] PAXIL 30 MG TABS (PAROXETINE HCL) Take 2 tablets by mouth once a day  #62 x 5   Entered and Authorized by:   Madelaine Etienne MD   Signed by:   Madelaine Etienne MD on 10/19/2007   Method used:   Electronically sent to ...       [id number]       [address] [place]  [address]       [place], [place]  [address]       Ph: [phone] or [phone]       Fax: [phone]   RxID:   [id number] LASIX 80 MG TABS (FUROSEMIDE) Take 1and 1/2  tablets by mouth once a day  #45 x 5   Entered and Authorized by:   Madelaine Etienne MD   Signed by:   Madelaine Etienne MD on 10/19/2007   Method used:   Electronically sent to ...       [id number]       [address]       [place], [place]  [address]       Ph: [phone] or [phone]       Fax: [phone]   RxID:   [id number]  ]         Diabetic Foot Exam Foot Inspection Is there a history of a foot ulcer?              No Is there a foot ulcer now?              No Can the patient see the bottom of their feet?           Yes Are the shoes appropriate in style and fit?          Yes Are the toenails long?                Yes Are the toenails thick?                Yes Are the toenails ingrown?              No Is there heavy callous build-up?              Yes Is there a claw toe deformity?                          Yes Is there limited ankle dorsiflexion?  No Is there foot or ankle muscle weakness?            Yes Do you have pain in calf while walking?           Yes         10-g (5.07) Semmes-Weinstein Monofilament Test Performed by: Maudry Diego, LELA          Right Foot          Left Foot Site 1         normal         normal Site 2         normal         normal Site 3         normal         normal Site 4         normal         normal Site 5         normal         normal Site 6         normal         normal Site 7         normal         normal Site 8         normal         normal Site 9         normal         normal Site 10         normal         normal   Laboratory Results   Blood Tests   Date/Time Recieved: October 19, 2007 12:30 PM Date/Time Reported: ..................................................................Marland KitchenAlric Quan  October 19, 2007 12:30 PM  HGBA1C: 7.3%   (Normal Range: Non-Diabetic - 3-6%   Control Diabetic - 6-8%) CBG Random: 157

## 2010-11-11 NOTE — Assessment & Plan Note (Signed)
Summary: DIABETIC TEACHING/CFB   Vital Signs:  Patient profile:   65 year old female Weight:      234.6 pounds BMI:     40.41 Is Patient Diabetic? Yes   Allergies: No Known Drug Allergies   Complete Medication List: 1)  Aspirin 81 Mg Tbec (Aspirin) .... Take 1 tablet by mouth once a day 2)  Lasix 80 Mg Tabs (Furosemide) .... Take 1/2  tablet by mouth once a day 3)  Paxil 30 Mg Tabs (Paroxetine hcl) .... Take 2 tablets by mouth once a day 4)  Lotensin 40 Mg Tabs (Benazepril hcl) .... Take 1 tablet by mouth once a day 5)  Glucophage 1000 Mg Tabs (Metformin hcl) .... Take 1 tablet by mouth two times a day 6)  Glucotrol 10 Mg Tabs (Glipizide) .... Take 1 tablet by mouth two times a day 7)  Nitroquick Subl (Nitroglycerin subl) .... Use as directed 8)  Imdur 30 Mg Tb24 (Isosorbide mononitrate) .... Take 1 tablet by mouth once a day 9)  Pepcid 20 Mg Tabs (Famotidine) .... Take 1 tablet by mouth two times a day 10)  Actos 15 Mg Tabs (Pioglitazone hcl) .... Take 1 tablet by mouth once a day 11)  Pravachol 80 Mg Tabs (Pravastatin sodium) .... Take 1 tablet by mouth once a day 12)  Ferrous Sulfate 325 (65 Fe) Mg Tabs (Ferrous sulfate) .... Take 1 tablet by mouth two times a day 13)  Atenolol 100 Mg Tabs (Atenolol) .... Take 1/2 pill daily by mouth. 14)  Tylenol Ex St Arthritis Pain 500 Mg Tabs (Acetaminophen) .... Two tabs every eight hours as needed for pain. 15)  Oscal 500/200 D-3 500-200 Mg-unit Tabs (Calcium-vitamin d) .... Take 1 tablet by mouth three times a day 16)  Mag-ox 400 400 Mg Tabs (Magnesium oxide) .... Take 1 tablet by mouth two times a day  Other Orders: T-Hgb A1C (in-house) ([id number]) DSMT (Medicaid) 60 Minutes [phone])   Diabetes Self Management Training  PCP:  Joaquin Courts  MD Date diagnosed with diabetes: 10/12/1982 Diabetes Type: Type 2 non-insulin treated Other persons present: no Current smoking Status: never  Vital Signs Todays Weight: 234.6lb  in    BMI 40.41in-lbs      Assessment Daily activities: very little- tired all the time, thinks she had a sleep study and remembers that they told her she snored. Does not have cpap, does not sleep much or well.- see [place] screening questionairre for sleep apnea Special needs or Barriers: today(04/18/09) is saying her back limits her activity- went for a treatment on Tuesday.  Diabetes Medications:  On Lipid lowering Medication? Yes On Anti-platelet Medication?     Yes Herbs or Supplements:              No Comments: last months average is 134mg /dl, which is a bit lower that A1C of 7.1%. Encouraged patient to continue to work on weigh tloss and physical acitivity to lower A1C below 7%. See scanned meter download. Says she doesn;t have neough strips to test mid sleep. asked her to skip one of her other daily checks to allow her a strip to test at this time.    Monitoring Self monitoring blood glucose 2 -3 times a day Measures urine ketones? No Name of Meter  [employer] Wears Medical I.D. No      Time of Testing  Before meals  Recent Episodes of: Hyperglycemia : No Hypoglycemia: No Severe Hypoglycemia : No      Nutrition assessment Who does  the food shopping? daughter Who does the cooking?  You Do you read food labels? If so what do you look at?  no- cannot read Biggest challenge to eating healthy: Eating too much Would you  say you are physically active: Yes  Nutritional Management Identify what foods most often affect blood glucose: Needs review/assistance Verbalize importance of controlling food portions: Demonstrates competency State importance of spacing and not omitting meals and snacks: Demonstrates competency State changes planned for home meals/snacks: Demonstrates competency  Psychosocial Adjustment Identify how stress affects diabetes & two sources of stress: Demonstrates competency Name two ways of obtaining support from family/friends: Demonstrates competency  Diabetes Management Education Done: 04/18/2009 Date of Diabetic Education: 04/18/2009    BEHAVIORAL GOAL FOLLOW UP Incorporating physical activity into lifestyle: Sometimes Incorporating appropriate nutritional management: Always Specific goal set today: eat fruit and vegetable instead of meats and potatoes and rice and grits and bread lift cans three times a week       Will discuss A1C with Rosa's PCP.  Has maintained weight over the past month. Encouragement given to contiue weight loss efforts. Diabetes Self Management Support: clinic staff, family- grandson  Follow-up: 6 weeks by phone and 3 months in office   Last LDL:                                                 65 (12/27/2008 8:35:00 PM)       Laboratory Results   Blood Tests   Date/Time Received: April 18, 2009 9:15 AM. Date/Time Reported: Alric Quan  April 18, 2009 9:15 AM  HGBA1C: 7.1%   (Normal Range: Non-Diabetic - 3-6%   Control Diabetic - 6-8%)

## 2010-11-11 NOTE — Assessment & Plan Note (Signed)
Summary: F1Y      Allergies Added: NKDA  Visit Type:  Follow-up Primary Provider:  Joaquin Courts  MD  CC:  no complaints.  History of Present Illness: [name] [name] is getting along well.  No major complaints.  Feels good.  Denies chest pain.  Scheduled for alot of lab work tomorrow.  Current Medications (verified): 1)  Aspirin 81 Mg Tbec (Aspirin) .... Take 1 Tablet By Mouth Once A Day 2)  Lasix 80 Mg Tabs (Furosemide) .... Take 1/2  Tablet By Mouth Once A Day 3)  Paxil 30 Mg Tabs (Paroxetine Hcl) .... Take 2 Tablets By Mouth Once A Day 4)  Lotensin 40 Mg Tabs (Benazepril Hcl) .... Take 1 Tablet By Mouth Once A Day 5)  Glucophage 1000 Mg Tabs (Metformin Hcl) .... Take 1 Tablet By Mouth Two Times A Day 6)  Glucotrol 10 Mg Tabs (Glipizide) .... Take 1 Tablet By Mouth Two Times A Day 7)  Nitroquick  Subl (Nitroglycerin Subl) .... Use As Directed 8)  Imdur 30 Mg Tb24 (Isosorbide Mononitrate) .... Take 1 Tablet By Mouth Once A Day 9)  Pepcid 20 Mg Tabs (Famotidine) .... Take 1 Tablet By Mouth Two Times A Day 10)  Pravachol 80 Mg Tabs (Pravastatin Sodium) .... Take 1 Tablet By Mouth Once A Day 11)  Ferrous Sulfate 325 (65 Fe) Mg Tabs (Ferrous Sulfate) .... Take 1 Tablet By Mouth Two Times A Day 12)  Atenolol 100 Mg Tabs (Atenolol) .... Take 1/2 Pill Daily By Mouth. 13)  Tylenol 325 Mg Tabs (Acetaminophen) .... Two Tabs Every Eight Hours As Needed Forpain. 14)  Oscal 500/200 D-3 500-200 Mg-Unit Tabs (Calcium-Vitamin D) .... Take 1 Tablet By Mouth Three Times A Day 15)  Mag-Ox 400 400 Mg Tabs (Magnesium Oxide) .... Take 1 Tablet By Mouth Two Times A Day 16)  Tylenol With Codeine #3 300-30 Mg Tabs (Acetaminophen-Codeine) .... Take 1 Tablet By Mouth Two Times A Day As Needed For Pain  Allergies (verified): No Known Drug Allergies  Past History:  Past Medical History: Last updated: 08/02/2009 Current Problems:  CAD (ICD-414.00) HYPERTENSION (ICD-401.9) DYSLIPIDEMIA (ICD-272.4) DYSPNEA  ON EXERTION (ICD-786.09) LEG EDEMA (ICD-782.3) NECK PAIN (ICD-723.1) OBESITY (ICD-278.00) GERD (ICD-530.81) DIABETES MELLITUS, TYPE II (ICD-250.00) SPINAL STENOSIS, LUMBAR (ICD-724.02) DEPRESSION (ICD-311) GLAUCOMA NOS (ICD-365.9) HEMORRHOIDS (ICD-455.6) HYPOMAGNESEMIA (ICD-275.2) DIVERTICULOSIS OF COLON (ICD-562.10) OSTEOPENIA (ICD-733.90) HEALTH SCREENING (ICD-V70.0) ARM PAIN, RIGHT (ICD-729.5) OTHER SCREENING MAMMOGRAM (ICD-V76.12) HYPOKALEMIA (ICD-276.8) ANEMIA, IRON DEFICIENCY (ICD-280.9) DEGENERATIVE JOINT DISEASE (ICD-715.90)  Vital Signs:  Patient profile:   65 year old female Height:      64 inches Weight:      223 pounds Pulse rate:   76 / minute Pulse rhythm:   regular BP sitting:   140 / 74  (left arm)  Vitals Entered By: Jacquelin Hawking, CMA (October 14, 2009 3:47 PM)  Physical Exam  General:  Well developed, well nourished, in no acute distress. Lungs:  Clear bilaterally to auscultation and percussion. Heart:  Non-displaced PMI, chest non-tender; regular rate and rhythm, S1, S2 without murmurs, rubs or gallops. Carotid upstroke normal, no bruit. Normal abdominal aortic size, no bruits. Femorals normal pulses, no bruits. Pedals normal pulses. No edema, no varicosities. Extremities:  No clubbing or cyanosis.  No edema.   EKG  Procedure date:  10/14/2009  Findings:      NSR.  WNL.  No acute changes.  Impression & Recommendations:  Problem # 1:  CAD (ICD-414.00) Has scattered non critical CAD, last cath 2005.  No current symptoms.  Continue medical therpay Her updated medication list for this problem includes:    Aspirin 81 Mg Tbec (Aspirin) .[name]... Take 1 tablet by mouth once a day    Lotensin 40 Mg Tabs (Benazepril hcl) .[name]... Take 1 tablet by mouth once a day    Nitroquick Subl (Nitroglycerin subl) ..... Use as directed    Imdur 30 Mg Tb24 (Isosorbide mononitrate) .[name]... Take 1 tablet by mouth once a day    Atenolol 100 Mg Tabs (Atenolol) .[name]... Take 1/2  pill daily by mouth.  Orders: EKG w/ Interpretation (93000)  Problem # 2:  DYSLIPIDEMIA (ICD-272.4) Followed in [facility].  Labs tomorrow are scheduled.  Reminded need. Her updated medication list for this problem includes:    Pravachol 80 Mg Tabs (Pravastatin sodium) .[name]... Take 1 tablet by mouth once a day  Problem # 3:  HYPERTENSION (ICD-401.9) Reasonable control.  As noted, needs labs. Her updated medication list for this problem includes:    Aspirin 81 Mg Tbec (Aspirin) .[name]... Take 1 tablet by mouth once a day    Lasix 80 Mg Tabs (Furosemide) .[name]... Take 1/2  tablet by mouth once a day    Lotensin 40 Mg Tabs (Benazepril hcl) .[name]... Take 1 tablet by mouth once a day    Atenolol 100 Mg Tabs (Atenolol) .[name]... Take 1/2 pill daily by mouth.  Orders: EKG w/ Interpretation (93000)  Patient Instructions: 1)  Your physician recommends that you continue on your current medications as directed. Please refer to the Current Medication list given to you today. 2)  Your physician wants you to follow-up in:   1 YEAR. You will receive a reminder letter in the mail two months in advance. If you don't receive a letter, please call our office to schedule the follow-up appointment.

## 2010-11-11 NOTE — Progress Notes (Signed)
Summary: med refill/wl  Phone Note Refill Request Message from:  Fax from Pharmacy on August 15, 2007 11:27 AM  Refills Requested: Medication #1:  LASIX 80 MG TABS Take 11/2  tablets by mouth once a day   Last Refilled: 06/10/2007 Unable to reach patient by phone to clarify  how she has been taking this med.     Method Requested: electronic Next Appointment Scheduled: none Initial call taken by: Dorene Sorrow RN,  August 15, 2007 11:28 AM  Follow-up for Phone Call        It looks like she was on 120 mg daily when she last saw [place] [facility]. Will continue at the same dose for now.  Follow-up by: Eliseo Gum MD,  August 15, 2007 11:51 AM  Additional Follow-up for Phone Call Additional follow up Details #1::        Patient called [facility]. She states she has been taking her Lasix  recently, but did have a period of time that she did not take it and had some swelling of her feet. Patient informed that refill has been authorized.  Additional Follow-up by: Dorene Sorrow RN,  August 15, 2007 12:11 PM    New/Updated Medications: LASIX 80 MG TABS (FUROSEMIDE) Take 1and 1/2  tablets by mouth once a day   Prescriptions: LASIX 80 MG TABS (FUROSEMIDE) Take 1and 1/2  tablets by mouth once a day  #45 x 3   Entered and Authorized by:   Eliseo Gum MD   Signed by:   Eliseo Gum MD on 08/15/2007   Method used:   Electronically sent to ...       [id number]       [address]       [place], [place]  [address]       Ph: [phone] or [phone]       Fax: [phone]   RxID:   [id number]

## 2010-11-11 NOTE — Consult Note (Signed)
Summary: [facility]: Diabetic Exam  [facility]: Diabetic Exam   Imported By: Florinda Marker 01/02/2009 16:34:11  _____________________________________________________________________  External Attachment:    Type:   Image     Comment:   External Document  Appended Document: [facility]: Diabetic Exam    Clinical Lists Changes  Observations: Added new observation of DMEYEEXAMNXT: 01/2010 (01/03/2009 18:40) Added new observation of DIAB EYE EX: No diabetic retinopathy.    (01/02/2009 18:40)       Diabetic Eye Exam  Procedure date:  01/02/2009  Findings:      No diabetic retinopathy.     Procedures Next Due Date:    Diabetic Eye Exam: 01/2010   Diabetic Eye Exam  Procedure date:  01/02/2009  Findings:      No diabetic retinopathy.     Procedures Next Due Date:    Diabetic Eye Exam: 01/2010

## 2010-11-11 NOTE — Letter (Signed)
Summary: A1C  A1C   Imported By: Florinda Marker 01/31/2009 13:58:38  _____________________________________________________________________  External Attachment:    Type:   Image     Comment:   External Document

## 2010-11-11 NOTE — Letter (Signed)
Summary: [facility]: CMN  [facility]: CMN   Imported By: Florinda Marker 02/11/2009 14:04:22  _____________________________________________________________________  External Attachment:    Type:   Image     Comment:   External Document

## 2010-11-11 NOTE — Letter (Signed)
Summary: [facility]  [facility]   Imported By: Shon Hough 07/01/2010 08:53:20  _____________________________________________________________________  External Attachment:    Type:   Image     Comment:   External Document

## 2010-11-11 NOTE — Miscellaneous (Signed)
[facility]   Imported By: Florinda Marker 01/21/2007 11:00:06  _____________________________________________________________________  External Attachment:    Type:   Image     Comment:   External Document

## 2010-11-11 NOTE — Progress Notes (Signed)
Summary: diabettes support/dmr  Phone Note Outgoing Call   Call placed by: Jamison Neighbor RD,CDE,  October 10, 2009 2:18 PM Summary of Call: called patient to contratuate her on continued weight loss and provide support. has lost another 1/2 pound in past 2 months. will call in a month to schedule follow-up for February for diabetes self managment training.

## 2010-11-11 NOTE — Assessment & Plan Note (Signed)
Summary: CHECKUP/ SB.   Vital Signs:  Patient Profile:   65 Years Old Female Height:     64 inches (162.56 cm) Weight:      237.1 pounds (107.77 kg) BMI:     40.85 Temp:     97.4 degrees F (36.33 degrees C) oral Pulse rate:   78 / minute BP sitting:   132 / 74  (right arm)  Pt. in pain?   yes    Location:   R/NECK    Intensity:     6    Type:       aching  Vitals Entered By: Theotis Barrio NT II (August 09, 2008 9:39 AM)              Is Patient Diabetic? Yes Did you bring your meter with you today? Yes Nutritional Status BMI of > 30 = obese CBG Result 91  Does patient need assistance? Functional Status Self care Ambulation Normal     PCP:  Joaquin Courts  MD  Chief Complaint:  PAIN RIGHT SIDE OF NECK / FLU SHOT  / DM FOLLOW UP.  History of Present Illness: Pt is a 65 yo female w/ past medical history below in the chart.  She is here for f/u of her routine diabetes care.  She has neck pain that radiates down her R shoulder to her elbow.  Sharp and shooting in nature.  She is requiring aleve two times a day for her neck pain and R shoulder pain.  Her blood sugars have been doing good.  No lows, no problems with her feet.  Pt notes having a colonoscopy across from [facility] a few years ago and doesn't think she is due for a repeat.     Updated Prior Medication List: Not taking amitriptyline or neurontin.  Current Allergies (reviewed today): No known allergies   Past Medical History:    Reviewed history from 08/26/2006 and no changes required:       Current Problems:        DYSLIPIDEMIA (ICD-272.4)       OBESITY (ICD-278.00)       CAD (ICD-414.00)       DIABETES MELLITUS, TYPE II (ICD-250.00)       HYPERTENSION (ICD-401.9)       DYSPNEA ON EXERTION (ICD-786.09)       ANEMIA, IRON DEFICIENCY (ICD-280.9)       POSTMENOPAUSAL BLEEDING (ICD-627.1)       DEGENERATIVE JOINT DISEASE (ICD-715.90)       SPINAL STENOSIS, LUMBAR (ICD-724.02)       GERD (ICD-530.81)      DEPRESSION (ICD-311)       GLAUCOMA NOS (ICD-365.9)          Social History:    Reviewed history from 05/03/2007 and no changes required:       Pt doesn't work currently, but used to work at [employer] as a Production assistant, radio.       Single.         Never used tobacco.       No alcohol or illicit drug use.       Does not exercise regularly.   Risk Factors: Tobacco use:  never Alcohol use:  no Exercise:  yes    Times per week:  one day    Type:  walking Seatbelt use:  100 %  Family History Risk Factors:    Family History of MI in females < 50 years old:  no  Family History of MI in males < 64 years old:  no  Mammogram History:    Date of Last Mammogram:  06/04/2008   Review of Systems       As per HPI.   Physical Exam  General:     Alert, pleasant, no distress. Neck:     Good ROM but has pain w/ movement.  Appears to have buffalo hump on posterior neck. Lungs:     Normal respiratory effort, chest expands symmetrically. Lungs are clear to auscultation, no crackles or wheezes. Heart:     RRR, SEM II/VI best heard at RSB. Abdomen:     Obese, +BS's, soft, NT, and ND. Msk:     Strength 5/5.   Extremities:     R levator scapulae TTP.  Biceps refleses normal bilaterally.  L patellar reflex hyperreflexive.  No loss of sensation in either upper extremity.   Neurologic:     CN's II-XII intact.  Psych:     Mood euthymic.      Impression & Recommendations:  Problem # 1:  NECK PAIN (ICD-723.1) Pt has had significant neck pain for many months.  Will change naproxen to tylenol b/c would like to avoid high dose NSAID's in the setting of her ACE inh, esp if this is a chronic problem.  Given that she has radicular symptoms and significant pain, will get MRI to further evaluate for ? nerve impingement.  She has significant dz in her lumbar spine.  Will f/u MRI results.  Pt will call if tylenol not helping.  Her updated medication list for this problem includes:    Aspirin 81  Mg Tbec (Aspirin) .[name]... Take 1 tablet by mouth once a day    Tylenol Ex St Arthritis Pain 500 Mg Tabs (Acetaminophen) .[name]..[name] Two tabs every eight hours as needed for pain.  Orders: MRI with & without Contrast (MRI w&w/o Contrast)   Problem # 2:  OSTEOPENIA (ICD-733.90) Reminded pt of dexa results and to start Ca + Vit D.  Her updated medication list for this problem includes:    Oscal 500/200 D-3 500-200 Mg-unit Tabs (Calcium-vitamin d) .[name]... Take 1 tablet by mouth three times a day   Problem # 3:  DIABETES MELLITUS, TYPE II (ICD-250.00) Up to date on eye exam.  BP around goal.  LDL 07/09 at goal.  Foot exam will be due at next visit.  No significant proteinuria on urine microalb/cr ratio 07/09.  Her updated medication list for this problem includes:    Aspirin 81 Mg Tbec (Aspirin) .[name]... Take 1 tablet by mouth once a day    Lotensin 40 Mg Tabs (Benazepril hcl) .[name]... Take 1 tablet by mouth once a day    Glucophage 1000 Mg Tabs (Metformin hcl) .[name]... Take 1 tablet by mouth two times a day    Glucotrol 10 Mg Tabs (Glipizide) .[name]... Take 1 tablet by mouth two times a day    Actos 30 Mg Tabs (Pioglitazone hcl) .[name]... Take 1 tablet by mouth once a day  Orders: T- Capillary Blood Glucose (52841) T-Hgb A1C (in-house) (32440NU) T-Basic Metabolic Panel [phone])  Labs Reviewed: HgBA1c: 6.7 (08/09/2008)   Creat: 0.62 (05/03/2008)   Microalbumin: 0.72 (05/03/2008)  Last Eye Exam: No diabetic retinopathy.    VA: 20/20 OD, 20/20 OS IOP: 17 OD, 18 OS  Performed by Dr. Dione Booze (01/06/2008)   Problem # 4:  Preventive Health Care (ICD-V70.0) Up to date on mammogram and dexa.  Stool cards given and calling to get  records b/c has had a colonoscopy and is unsure when this is due.  Will discuss need for paps at her next office visit.   Complete Medication List: 1)  Aspirin 81 Mg Tbec (Aspirin) .... Take 1 tablet by mouth once a day 2)  Lasix 80 Mg Tabs (Furosemide) .... Take 1and 1/2  tablets  by mouth once a day 3)  Paxil 30 Mg Tabs (Paroxetine hcl) .... Take 2 tablets by mouth once a day 4)  Lotensin 40 Mg Tabs (Benazepril hcl) .... Take 1 tablet by mouth once a day 5)  Glucophage 1000 Mg Tabs (Metformin hcl) .... Take 1 tablet by mouth two times a day 6)  Glucotrol 10 Mg Tabs (Glipizide) .... Take 1 tablet by mouth two times a day 7)  Nitroquick Subl (Nitroglycerin subl) .... Use as directed 8)  Imdur 30 Mg Tb24 (Isosorbide mononitrate) .... Take 1 tablet by mouth once a day 9)  Pepcid 20 Mg Tabs (Famotidine) .... Take 1 tablet by mouth two times a day 10)  Actos 30 Mg Tabs (Pioglitazone hcl) .... Take 1 tablet by mouth once a day 11)  Pravachol 80 Mg Tabs (Pravastatin sodium) .... Take 1 tablet by mouth once a day 12)  Ferrous Sulfate 325 (65 Fe) Mg Tabs (Ferrous sulfate) .... Take 1 tablet by mouth two times a day 13)  Atenolol 100 Mg Tabs (Atenolol) .... Take 1/2 pill daily by mouth. 14)  Magnesium Oxide 400 Mg Tabs (Magnesium oxide) .... Take 1 tablet by mouth twice daily for low magnesium. 15)  Tylenol Ex St Arthritis Pain 500 Mg Tabs (Acetaminophen) .... Two tabs every eight hours as needed for pain. 16)  Oscal 500/200 D-3 500-200 Mg-unit Tabs (Calcium-vitamin d) .... Take 1 tablet by mouth three times a day  Other Orders: T-Hemoccult Card-Multiple (take home) (14431) T-CBC No Diff ([id number])   Patient Instructions: 1)  Please make a followup appointment in 3 months. 2)  Please take tylenol instead of aleve for pain. 3)  Call if you are not getting enough relief.   4)  We will call you with abnormalities.  5)  Don't forget to start taking your calcium and Vit D.   Prescriptions: TYLENOL EX ST ARTHRITIS PAIN 500 MG TABS (ACETAMINOPHEN) Two tabs every eight hours as needed for pain.  #120 x prn   Entered and Authorized by:   Joaquin Courts  MD   Signed by:   Joaquin Courts  MD on 08/09/2008   Method used:   Electronically to        [id number] (retail)       [address]       [place], [place]  [address]       Ph: [phone] or [phone]       Fax: [phone]   RxID:   [id number]  ] Laboratory Results   Blood Tests   Date/Time Received: August 09, 2008 10:49 AM. Date/Time Reported: Alric Quan  August 09, 2008 10:49 AM   HGBA1C: 6.7%   (Normal Range: Non-Diabetic - 3-6%   Control Diabetic - 6-8%) CBG Random:: 91mg /dL

## 2010-11-11 NOTE — Progress Notes (Signed)
Summary: refill/ [facility]  Phone Note Refill Request Message from:  Patient on January 25, 2007 1:55 PM  Refills Requested: Medication #1:  LOTENSIN 40 MG TABS Take 1 tablet by mouth once a day  Medication #2:  ATENOLOL 100 MG TABS Take 1/2 pill daily by mouth.. please refer to visit notes of 3/3 and 3/31, pt needs atenolol and lotensin, may we refill?  Initial call taken by: Marin Roberts RN,  January 25, 2007 1:55 PM  Follow-up for Phone Call        Refill approved-nurse to complete for Lotensin. Patient recently given refills on Atenolol, therefore, no further refills given. Follow-up by: Eliseo Gum MD,  January 25, 2007 2:45 PM  Additional Follow-up for Phone Call Additional follow up Details #1::        Rx faxed to pharmacy Additional Follow-up by: Marin Roberts RN,  January 25, 2007 5:09 PM    Prescriptions: LOTENSIN 40 MG TABS (BENAZEPRIL HCL) Take 1 tablet by mouth once a day  #31 x 5   Entered and Authorized by:   Eliseo Gum MD   Signed by:   Eliseo Gum MD on 01/25/2007   Method used:   Telephoned to ...       [facility]       [address] [place], [place]  [address]  [place]       Ph: [phone]       Fax: [phone]   RxID:   [id number]

## 2010-11-11 NOTE — Consult Note (Signed)
Summary: [facility]  [facility]   Imported By: Florinda Marker 01/25/2009 14:40:36  _____________________________________________________________________  External Attachment:    Type:   Image     Comment:   External Document

## 2010-11-11 NOTE — Letter (Signed)
Summary: [facility]  [facility]   Imported By: Margie Billet 01/15/2010 09:15:25  _____________________________________________________________________  External Attachment:    Type:   Image     Comment:   External Document  Appended Document: [facility]    Clinical Lists Changes  Observations: Added new observation of DMEYEEXAMNXT: 01/2011 (01/20/2010 8:40) Added new observation of DIAB EYE EX: No diabetic retinopathy.    (01/08/2010 8:41)       Diabetic Eye Exam  Procedure date:  01/08/2010  Findings:      No diabetic retinopathy.     Procedures Next Due Date:    Diabetic Eye Exam: 01/2011   Diabetic Eye Exam  Procedure date:  01/08/2010  Findings:      No diabetic retinopathy.     Procedures Next Due Date:    Diabetic Eye Exam: 01/2011

## 2010-11-11 NOTE — Letter (Signed)
[facility]   Imported By: Shon Hough 06/30/2010 15:18:39  _____________________________________________________________________  External Attachment:    Type:   Image     Comment:   External Document

## 2010-11-11 NOTE — Consult Note (Signed)
Summary: [facility]   Imported By: Florinda Marker 09/03/2009 14:11:53  _____________________________________________________________________  External Attachment:    Type:   Image     Comment:   External Document  Appended Document: [facility] Noted evaluation by Dr. Marikay Alar, neurosurgery and plans for workup of neck pain consisting of an MRI and re-evaluation following imaging.

## 2010-11-11 NOTE — Assessment & Plan Note (Signed)
Summary: EST-CK/FU/MEDS/CFB   Vital Signs:  Patient Profile:   65 Years Old Female Height:     64 inches (162.56 cm) Weight:      236.1 pounds BMI:     40.67 Temp:     97.2 degrees F oral Pulse rate:   71 / minute BP sitting:   136 / 67  (right arm)  Pt. in pain?   yes    Location:   shoulder    Intensity:   7    Type:       aching  Vitals Entered ByFilomena Jungling (February 08, 2008 3:10 PM)              Is Patient Diabetic? Yes  Nutritional Status BMI of > 30 = obese CBG Result 118  Does patient need assistance? Functional Status Self care Ambulation Normal     PCP:  Madelaine Etienne MD  Chief Complaint:  ARM PAIN PATIENT FELL ABOUT 2 WEEKS AGO.  History of Present Illness: 65 y/o AAF with h/o HTN, DM2, DOE s/p extensive workup, CAD, morbid obesity, spinal stenosis, Fe-def anemia presents for f/u.  The pt is also being followed by Dr. Riley Kill ([facility] - last saw on Mon, 1/26) and Dr. Marikay Alar (Ortho).  Today, the pt reports right humeral pain s/p fall 2 weeks ago.  Was outside when she lost her footing, fell on her right side, landing on her right upper arm.  This area did not bruise but has been hurting ever since, per pt.  Pain is aching.  No elbow or hand pain.  Overall, feels well but is concerned that her DOE is getting worse again.  Has intermittent baseline DOE, but cardiac workup has been done and was unrevealing.  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  Denies any CP, palpitations, worsening dyspnea, N/V/D, constipation. No fevers, chills, or back/leg pain. Says she is planning on returning to cardiac rehab next week - was out since last fall 2/2 back surgery but wants to go back now, as this helped with her DOE in the past.  Regarding her chronic LEx edema, she repots it continued to fluctuate but has improved overall since she began wearing compression hose a few months ago.    Current Allergies: No known allergies   Past Medical History:    Reviewed history from 08/26/2006 and no changes required:       Current Problems:        DYSLIPIDEMIA (ICD-272.4)       OBESITY (ICD-278.00)       CAD (ICD-414.00)       DIABETES MELLITUS, TYPE II (ICD-250.00)       HYPERTENSION (ICD-401.9)       DYSPNEA ON EXERTION (ICD-786.09)       ANEMIA, IRON DEFICIENCY (ICD-280.9)       POSTMENOPAUSAL BLEEDING (ICD-627.1)       DEGENERATIVE JOINT DISEASE (ICD-715.90)       SPINAL STENOSIS, LUMBAR (ICD-724.02)       GERD (ICD-530.81)       DEPRESSION (ICD-311)       GLAUCOMA NOS (ICD-365.9)          Family History:    Reviewed history from 05/03/2007 and no changes required:       Family = 3 brothers, 9 sisters (1 deceased).         Pt's father and sister developed type II DM.  No MIs or strokes in pt's family of which pt is aware.       No h/o cancer in family.       Pt's 2 sisters have HTN and HL.  Social History:    Reviewed history from 05/03/2007 and no changes required:       Pt doesn't work currently, but used to work at [employer] as a Production assistant, radio.       Single.         Never used tobacco.       No alcohol or illicit drug use.       Does not exercise regularly.   Risk Factors: Tobacco use:  never Alcohol use:  no Exercise:  yes    Times per week:  one day    Type:  walking Seatbelt use:  100 %  Family History Risk Factors:    Family History of MI in females < 55 years old:  no    Family History of MI in males < 69 years old:  no   Review of Systems       The patient complains of dyspnea on exhertion and peripheral edema.  The patient denies anorexia, fever, chest pain, syncope, prolonged cough, hemoptysis, abdominal pain, melena, hematochezia, severe indigestion/heartburn, and hematuria.     Physical Exam  General:     Alert, morbidly obese African-American woman in no acute distress. [name] [name]:     Normocephalic and atraumatic. Eyes:      Bespectacled.  PERRLA.  EOMI.  No scleral icterus or conjunctival injection. Mouth:     Fair dentition.  Oropharynx pink and moist. Neck:     Supple without LAD or TM. Lungs:     Distant breath sounds 2/2 habitus, but no wheezes, rales, or rhonchi appreciated. Heart:     Distant heart sounds 2/2 habitus.  Regular rate and rhythm without appreciable murmurs, rubs, or gallops. Abdomen:     Obese, soft, non-tender, non-distended.  Normoactive bowel sounds. Msk:     Normal ROM of all extremities.  Mild-to-moderate tenderness on palpation of the lateral right upper arm (overlying the triceps and deltoid).  No obvious ecchymoses or wounds.  A small ( ~1x1 cm) nodule is present on the left lateral upper arm, query small lipoma vs. subcutaneous hematoma.  No shoulder or elbow crepitation, swelling, instability, or erythema.  No sensory deficits in the upper or lower extremities. Extremities:     Wearing lower extremity compression stockings bilaterally. Neurologic:     A&Ox3.  CN grossly intact. Psych:     Good eye contact.  Appropriate and cooperative.    Impression & Recommendations:  Problem # 1:  ARM PAIN, RIGHT (ICD-729.5) Pt reports a fall on dirt/grass  ~2 weeks ago while outside; briefly lost her balance.  Had no sudden pain afterwards, but has noted aching LUE pain since the fall.  Says this area did not have a bruise or any open wounds after the fall.  On exam, she has moderate tenderness to palpation over the lateral aspect of the right humerus, but not obvious joint deformities, swelling, or instability.  I highly doubt a fracture is present, but pt is very concerned given her age that she broke her arm.  Because of the tenderness on exam and her concern, I agreed to have x-rays of the right elbow, humerus, and shoulder done today to rule out acute fracture.  This is more likely residual muscle pain, possibly related to some tendinous or ligamentous injury  after the fall.  I suspect  this is self-limited.  The pain is manageable with OTC analgesics.  The pt was instructed to continue Tylenol as needed, with heat or cold application if these help.  If the pain lasts beyond next week or suddenly worsens, she will call us for return appt.  At that time, assuming no fractures on the x-rays done today, further imaging may need to be considered.  Problem # 2:  HYPOKALEMIA (ICD-276.8) K today is quite low at 2.9.  I tried calling the pt at home, but got no answer.  I have sent a flag to the [facility] After Hours RN to ask that the pt be contacted and told to stop the Lasix for now.  I have also ordered KCl to be called in to her pharmacy - she is to take by mouth every 6 hours for the next few days and come in on Friday to have a repeat BMET.  Problem # 3:  HYPERTENSION (ICD-401.9) BP mildly elevated today.  Given her chronic hypokalemia, I suspect the Lasix will need to be stopped indefinitely and a new agent such as Norvasc will need to be added.  HCTZ is also likely to cause hypokalemia and she is already on a beta-blocker and an ACEI.  Her updated medication list for this problem includes:    Lasix 80 Mg Tabs (Furosemide) .[name]... Take 1and 1/2  tablets by mouth once a day    Lotensin 40 Mg Tabs (Benazepril hcl) .[name]... Take 1 tablet by mouth once a day    Atenolol 100 Mg Tabs (Atenolol) .[name]... Take 1/2 pill daily by mouth.  Orders: T-Basic Metabolic Panel [id number])  BP today: 136/67 Prior BP: 124/66 (11/10/2007)  Labs Reviewed: Creat: 0.76 (11/10/2007)   Problem # 4:  DYSPNEA ON EXERTION (ICD-786.09) This is an ongoing problem of unclear etiology.  Differential dx includes cardiogenic or pulmonary causes.  2D ECHO last year showed normal EF and valvular function, indicating normal systolic function, though some diastolic dysfunction may be present.  She has chronic lower extremity edema that fluctuates, but has improved somewhat since she began wearing compression  stockings.  Without clear evidence of a cardiac cause for shortness-of-breath, I am concerned about lung disease.  The pt reports that she has had PFTs in the past ('many years ago'), but given the possibility of current lung disease contributing to her dyspnea, will recheck PFTs as an outpatient ASAP.  She may have reversible bronchoconstriction and get symptomatic relief from a beta-agonist.  Alternatively, if her dyspnea continues to be troublesome, I would consider stopping the Actos in spite of the fact that she does not have documented active CHF.  Her updated medication list for this problem includes:    Lasix 80 Mg Tabs (Furosemide) .[name]... Take 1and 1/2  tablets by mouth once a day    Lotensin 40 Mg Tabs (Benazepril hcl) .[name]... Take 1 tablet by mouth once a day    Atenolol 100 Mg Tabs (Atenolol) .[name]... Take 1/2 pill daily by mouth.  Orders: PFT Baseline-Pre/Post Bronchodiolator (PFT Baseline-Pre/Pos) Diagnostic X-Ray/Fluoroscopy (Diagnostic X-Ray/Flu)   Problem # 5:  LEG EDEMA (ICD-782.3) Improved with addition of compression stockings.  Her updated medication list for this problem includes:    Lasix 80 Mg Tabs (Furosemide) .[name]... 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  Continue current regimen for now.  Her updated medication  list for this problem includes:    Aspirin 81 Mg Tbec (Aspirin) .[name]... Take 1 tablet by mouth once a day    Lotensin 40 Mg Tabs (Benazepril hcl) .[name]... Take 1 tablet by mouth once a day    Glucophage 1000 Mg Tabs (Metformin hcl) .[name]... Take 1 tablet by mouth two times a day    Glucotrol 10 Mg Tabs (Glipizide) .[name]... Take 1 tablet by mouth two times a day    Actos 30 Mg Tabs (Pioglitazone hcl) .[name]... Take 1 tablet by mouth once a day  Labs Reviewed: HgBA1c: 6.7 (02/08/2008)   Creat: 0.76 (11/10/2007)     Last Eye Exam: No diabetic retinopathy.    VA: 20/20 OD, 20/20 OS IOP: 17 OD,  18 OS  Performed by Dr. Dione Booze (01/06/2008)   Complete Medication List: 1)  Aspirin 81 Mg Tbec (Aspirin) .... Take 1 tablet by mouth once a day 2)  Lasix 80 Mg Tabs (Furosemide) .... Take 1and 1/2  tablets by mouth once a day 3)  Paxil 30 Mg Tabs (Paroxetine hcl) .... Take 2 tablets by mouth once a day 4)  Lotensin 40 Mg Tabs (Benazepril hcl) .... Take 1 tablet by mouth once a day 5)  Glucophage 1000 Mg Tabs (Metformin hcl) .... Take 1 tablet by mouth two times a day 6)  Glucotrol 10 Mg Tabs (Glipizide) .... Take 1 tablet by mouth two times a day 7)  Nitroquick Subl (Nitroglycerin subl) .... Use as directed 8)  Imdur 30 Mg Tb24 (Isosorbide mononitrate) .... Take 1 tablet by mouth once a day 9)  Pepcid 20 Mg Tabs (Famotidine) .... Take 1 tablet by mouth two times a day 10)  Actos 30 Mg Tabs (Pioglitazone hcl) .... Take 1 tablet by mouth once a day 11)  Neurontin 300 Mg Caps (Gabapentin) .... Take 1 tablet by mouth once a day 12)  Pravachol 80 Mg Tabs (Pravastatin sodium) .... Take 1 tablet by mouth once a day 13)  Ferrous Sulfate 325 (65 Fe) Mg Tabs (Ferrous sulfate) .... Take 1 tablet by mouth two times a day 14)  Atenolol 100 Mg Tabs (Atenolol) .... Take 1/2 pill daily by mouth. 15)  Amitriptyline Hcl 25 Mg Tabs (Amitriptyline hcl) .... Take 1 tablet by mouth 30 minutes before bedtime. 16)  Magnesium Oxide 400 Mg Tabs (Magnesium oxide) .... Take 1 tablet by mouth twice daily for low magnesium.  Other Orders: T- Capillary Blood Glucose (16109) T-Hgb A1C (in-house) ([id number]) T-BNP  (B Natriuretic Peptide) ([id number])   Patient Instructions: 1)  Please make a follow-up appointment in 1-2 months (sooner if your arm pain does not improve).    ] Laboratory Results   Blood Tests   Date/Time Recieved: February 08, 2008 3:36 PM Date/Time Reported: .SIGN  HGBA1C: 6.7%   (Normal Range: Non-Diabetic - 3-6%   Control Diabetic - 6-8%) CBG Random: 118

## 2010-11-11 NOTE — Assessment & Plan Note (Signed)
Summary: EST-1 MONTH RECHECK/CH   Vital Signs:  Patient profile:   65 year old female Height:      64 inches (162.56 cm) Weight:      224.03 pounds (101.83 kg) BMI:     38.59 Temp:     97.5 degrees F (36.39 degrees C) oral Pulse rate:   55 / minute BP sitting:   134 / 66  (left arm)  Vitals Entered By: Angelina Ok RN (March 12, 2010 9:53 AM) Is Patient Diabetic? Yes Did you bring your meter with you today? Yes Pain Assessment Patient in pain? yes     Location: left foot Intensity: 7 Type: aching Onset of pain  With activity Nutritional Status BMI of > 30 = obese CBG Result 265  Have you ever been in a relationship where you felt threatened, hurt or afraid?No   Does patient need assistance? Functional Status Self care Ambulation Impaired:Risk for fall Comments Check up.  Refills   Primary Care Provider:  Joaquin Courts  MD   History of Present Illness: Pt is a 65 yo female w/ past med hx below here for f/u on diabetes and other chronic conditions.  She notes overall she is doing very well.  She notes continued pain in her R thumb and L wrist that is relieved w/ voltaren gel.  However, she would like to see sports medicine in an effort to stop some of her medications.  She has completed PT for her gait difficulties and has not had any falls.  Her sugars have mostly been in the 100-250 range.  Her am sugars are usually around 200 and there is not one meal/time of the day that is markedly worse than the others.  She is working hard on her diet to bring them down and can tell it makes a difference.  She denies memory troubles and falls.   Depression History:      The patient denies a depressed mood most of the day and a diminished interest in her usual daily activities.         Preventive Screening-Counseling & Management  Alcohol-Tobacco     Alcohol drinks/day: 0     Smoking Status: never  Current Medications (verified): 1)  Aspirin 81 Mg Tbec (Aspirin) .... Take 1  Tablet By Mouth Once A Day 2)  Lasix 80 Mg Tabs (Furosemide) .... Take 1/2  Tablet By Mouth Once A Day 3)  Paxil 30 Mg Tabs (Paroxetine Hcl) .... Take 1 Tablet By Mouth Once A Day. 4)  Lotensin 40 Mg Tabs (Benazepril Hcl) .... Take 1 Tablet By Mouth Once A Day 5)  Glucophage 1000 Mg Tabs (Metformin Hcl) .... Take 1 Tablet By Mouth Two Times A Day 6)  Glucotrol 10 Mg Tabs (Glipizide) .... Take 2 Tablets By Mouth Two Times A Day 7)  Nitroquick  Subl (Nitroglycerin Subl) .... Use As Directed 8)  Imdur 30 Mg Tb24 (Isosorbide Mononitrate) .... Take 1 Tablet By Mouth Once A Day 9)  Pepcid 20 Mg Tabs (Famotidine) .... Take 1 Tablet By Mouth Two Times A Day 10)  Pravachol 80 Mg Tabs (Pravastatin Sodium) .... Take 1 Tablet By Mouth Once A Day 11)  Ferrous Sulfate 325 (65 Fe) Mg Tabs (Ferrous Sulfate) .... Take 1 Tablet By Mouth Two Times A Day 12)  Atenolol 100 Mg Tabs (Atenolol) .... Take 1/2 Pill Daily By Mouth. 13)  Tylenol 325 Mg Tabs (Acetaminophen) .... Two Tabs Every Eight Hours As Needed Forpain. 14)  Calcium Carbonate 600 Mg Tabs (Calcium Carbonate) .... Take 1 Tablet By Mouth Two Times A Day 15)  Tylenol With Codeine #3 300-30 Mg Tabs (Acetaminophen-Codeine) .... Take 1 Tablet By Mouth Two Times A Day As Needed For Pain 16)  Cobal-1000 1000 Mcg/ml Soln (Cyanocobalamin) .... Inject 1 Ml Once Monthly. 17)  Vitamin D3 1000 Unit Tabs (Cholecalciferol) .... Take 1 Tablet By Mouth Once A Day 18)  Mag-Delay 535 (64 Mg) Mg Cr-Tabs (Magnesium Chloride) .... Take 1 Tablet By Mouth Two Times A Day 19)  Voltaren 1 % Gel (Diclofenac Sodium) .... Apply Small Amount To Affected Area Up To 4 Times A Day. 20)  Syringe 23g X 3/4" 3 Ml Misc (Syringe/needle (Disp)) .... Use To Injection B12.  Allergies (verified): No Known Drug Allergies  Past History:  Past Medical History: Last updated: 02/06/2010 Current Problems:  CAD (ICD-414.00) HYPERTENSION (ICD-401.9) DYSLIPIDEMIA (ICD-272.4) DYSPNEA ON EXERTION  (ICD-786.09) LEG EDEMA (ICD-782.3) NECK PAIN (ICD-723.1) OBESITY (ICD-278.00) GERD (ICD-530.81) DIABETES MELLITUS, TYPE II (ICD-250.00) SPINAL STENOSIS, LUMBAR (ICD-724.02) DEPRESSION (ICD-311) GLAUCOMA NOS (ICD-365.9) HEMORRHOIDS (ICD-455.6) HYPOMAGNESEMIA (ICD-275.2) DIVERTICULOSIS OF COLON (ICD-562.10) OSTEOPENIA (ICD-733.90) HEALTH SCREENING (ICD-V70.0) ARM PAIN, RIGHT (ICD-729.5) OTHER SCREENING MAMMOGRAM (ICD-V76.12) HYPOKALEMIA (ICD-276.8) ANEMIA, IRON DEFICIENCY (ICD-280.9) DEGENERATIVE JOINT DISEASE (ICD-715.90) B12 def- B12 level 213 on 03/11  Past Surgical History: Last updated: 08/02/2009 Posterior lumbar interfusion surgery at L2-L3 (Dr. Marikay Alar), 07/18/07 Lumbar fusion surgery at L3-L5 (s/p hardware removal in 10/08 on re-exploration), c. 2006 Rotator cuff repair, date unknown  History of shoulder surgery. Cholecystectomy.  endometrial biopsy.  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 Single.   Never used tobacco. No alcohol or illicit drug use. Does exercise regularly. Walks 3 times/wk. Goes to exercise at [facility]  Social History: Reviewed history from 08/02/2009 and no changes required. 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 Single.   Never used tobacco. No alcohol or illicit drug use. Does exercise regularly. Walks 3 times/wk. Goes to exercise at [facility]  Review of Systems       as per HPI.  Physical Exam  General:  alert, oriented, well groomed, no distress.  Eyes:  wearing glasses, sclera are anicteric.  Mouth:  MMM. Neck:  no JVD, no LAD.  Lungs:  Normal respiratory effort, chest expands symmetrically. Lungs are clear to auscultation, no crackles or wheezes. Heart:  Normal rate and regular rhythm, no m/r/g. Abdomen:  obese, soft, NT, +BS's, ND.  Extremities:  no peripheral edema, L LE is slightly larger than the R which is chronic.    Neurologic:  walks w/ a cane, oriented, no focal deficits.  Cervical Nodes:  No lymphadenopathy noted Axillary Nodes:  No palpable lymphadenopathy Psych:  mood euthymic.    Impression & Recommendations:  Problem # 1:  DIABETES MELLITUS, TYPE II (ICD-250.00) She is trying her best w/ diet.  I have reviewed her sugars and she has done a great job checking them and most of them are up.  I have discussed this w/ Lupita Leash and I think basal insulin, starting at a very low dose, might be helpful for her.  At this time, will add low dose lantus and have her f/u w/ Lupita Leash in the next week or two for instructions on how to self administer insulin and then f/u w/ a provider 2 weeks after starting to make sure she is not having any lows.  Ultimately, it might  be helpful if she can get off some of her meds and with the addition of lantus we may be able to d/c the gllipizide.  I think she still would benefit from metformin so will plan on continuing that indefinnitely. Her updated medication list for this problem includes:     Aspirin 81 Mg Tbec (Aspirin) .[name]... Take 1 tablet by mouth once a day    Lotensin 40 Mg Tabs (Benazepril hcl) .[name]... Take 1 tablet by mouth once a day    Glucophage 1000 Mg Tabs (Metformin hcl) .[name]... Take 1 tablet by mouth two times a day    Glucotrol 10 Mg Tabs (Glipizide) .[name]... Take 2 tablets by mouth two times a day    Lantus Solostar 100 Unit/ml Soln (Insulin glargine) ..... Inject 5 units at bedtime.  Orders: T- Capillary Blood Glucose (82948) T-Hgb A1C (in-house) (84696EX)  Labs Reviewed: Creat: 0.68 (10/15/2009)     Last Eye Exam: No diabetic retinopathy.    (01/08/2010) Reviewed HgBA1c results: 8.2 (03/12/2010)  7.4 (11/21/2009)  Problem # 2:  WRIST PAIN (BMW-413.24) She continues to have L wrist pain and R thumb pain and workup at our last visit was essentially unrevealing.  Voltaren gel helped and tylenol w/ codeine helps.  However, she would like to see sports medicine  to see if anything can be done, such as injection, etc, so she can stop taking as many of these medications as possible so referral placed.  Orders: Sports Medicine (Sports Med)  Problem # 3:  VITAMIN B12 DEFICIENCY (ICD-266.2) Shot today.  Orders: Vit B12 1000 mcg (J3420) Admin of Therapeutic Inj  intramuscular or subcutaneous (40102)  Problem # 4:  ACCIDENTAL FALL (ICD-E888.9) PT complete.  No recurrence.  Repleting vit D.   Problem # 5:  GERD (ICD-530.81) She has not had symptoms in a long time so I told her it was reasonable tos top the pepcid in an effort to decrease the number of medications she is taking.  The following medications were removed from the medication list:    Pepcid 20 Mg Tabs (Famotidine) .[name]... Take 1 tablet by mouth two times a day  Problem # 6:  HYPOMAGNESEMIA (ICD-275.2) Will f/u mg level in 2-3 weeks when she sees Jamison Neighbor.  Future Orders: T-Magnesium ([id number]) ... 03/13/2010  Problem # 7:  HYPERTENSION (ICD-401.9) Essentially at goal, no need for changes.   Her updated medication list for this problem includes:    Lasix 80 Mg Tabs (Furosemide) .[name]... Take 1/2  tablet by mouth once a day    Lotensin 40 Mg Tabs (Benazepril hcl) .[name]... Take 1 tablet by mouth once a day    Atenolol 100 Mg Tabs (Atenolol) .[name]... Take 1/2 pill daily by mouth.  BP today: 134/66 Prior BP: 136/59 (02/06/2010)  Labs Reviewed: K+: 4.4 (10/15/2009) Creat: : 0.68 (10/15/2009)   Chol: 140 (10/15/2009)   HDL: 51 (10/15/2009)   LDL: 78 (10/15/2009)   TG: 55 (10/15/2009)  Complete Medication List: 1)  Aspirin 81 Mg Tbec (Aspirin) .... Take 1 tablet by mouth once a day 2)  Lasix 80 Mg Tabs (Furosemide) .... Take 1/2  tablet by mouth once a day 3)  Paxil 30 Mg Tabs (Paroxetine hcl) .... Take 1 tablet by mouth once a day. 4)  Lotensin 40 Mg Tabs (Benazepril hcl) .... Take 1 tablet by mouth once a day 5)  Glucophage 1000 Mg Tabs (Metformin hcl) .... Take 1 tablet by mouth two  times a day 6)  Glucotrol  10 Mg Tabs (Glipizide) .... Take 2 tablets by mouth two times a day 7)  Nitroquick Subl (Nitroglycerin subl) .... Use as directed 8)  Imdur 30 Mg Tb24 (Isosorbide mononitrate) .... Take 1 tablet by mouth once a day 9)  Pravachol 80 Mg Tabs (Pravastatin sodium) .... Take 1 tablet by mouth once a day 10)  Ferrous Sulfate 325 (65 Fe) Mg Tabs (Ferrous sulfate) .... Take 1 tablet by mouth two times a day 11)  Atenolol 100 Mg Tabs (Atenolol) .... Take 1/2 pill daily by mouth. 12)  Tylenol 325 Mg Tabs (Acetaminophen) .... Two tabs every eight hours as needed forpain. 13)  Calcium Carbonate 600 Mg Tabs (Calcium carbonate) .... Take 1 tablet by mouth two times a day 14)  Tylenol With Codeine #3 300-30 Mg Tabs (Acetaminophen-codeine) .... Take 1 tablet by mouth two times a day as needed for pain 15)  Cobal-1000 1000 Mcg/ml Soln (Cyanocobalamin) .... Inject 1 ml once monthly. 16)  Vitamin D3 1000 Unit Tabs (Cholecalciferol) .... Take 1 tablet by mouth once a day 17)  Mag-delay 535 (64 Mg) Mg Cr-tabs (Magnesium chloride) .... Take 1 tablet by mouth two times a day 18)  Voltaren 1 % Gel (Diclofenac sodium) .... Apply small amount to affected area up to 4 times a day. 19)  Syringe 23g X 3/4" 3 Ml Misc (Syringe/needle (disp)) .... Use to injection b12. 20)  Lantus Solostar 100 Unit/ml Soln (Insulin glargine) .... Inject 5 units at bedtime.  Patient Instructions: 1)  Please make a followup appointment in 2 weeks with Jamison Neighbor to discuss insulin. 2)  Continue to do a great job checking your blood sugars and brining in your meter!!! 3)  Stop taking pepcid.  4)  We will plan to start lantus 5 units at bedtime after you see Jamison Neighbor. 5)  Please see a doctor here in the clinic 2 weeks after you start insulin.  6)  Please see the sport's medicine doctor regarding your pain.  Prescriptions: LANTUS SOLOSTAR 100 UNIT/ML SOLN (INSULIN GLARGINE) Inject 5 units at bedtime.  #1 pen x 0    Entered and Authorized by:   Joaquin Courts  MD   Signed by:   Joaquin Courts  MD on 03/13/2010   Method used:   Electronically to        [id number] (retail)       [address]       [place], [place]  [id number]       Ph: [phone]       Fax: [phone]   RxID:   [id number]    Vital Signs:  Patient profile:   65 year old female Height:      64 inches (162.56 cm) Weight:      224.03 pounds (101.83 kg) BMI:     38.59 Temp:     97.5 degrees F (36.39 degrees C) oral Pulse rate:   55 / minute BP sitting:   134 / 66  (left arm)  Vitals Entered By: Angelina Ok RN (March 12, 2010 9:53 AM)   Prevention & Chronic Care Immunizations   Influenza vaccine: Fluvax 3+  (07/25/2009)   Influenza vaccine deferral: Deferred  (12/24/2009)    Tetanus booster: Not documented    Pneumococcal vaccine: Pneumovax  (02/06/2010)    H. zoster vaccine: Not documented   H. zoster vaccine deferral: Refused  (03/12/2010)  Colorectal Screening   Hemoccult: Not documented    Colonoscopy:  Results: Hemorrhoids.       (  01/14/2009)   Colonoscopy action/deferral: Repeat colonoscopy in 10 years.    (01/14/2009)   Colonoscopy due: 01/2019  Other Screening   Pap smear: Not documented   Pap smear action/deferral: Not indicated-other  (03/12/2010)    Mammogram: ASSESSMENT: Negative - BI-RADS 1^MM DIGITAL SCREENING  (06/05/2009)   Mammogram action/deferral: Screening mammogram in 1 year.     (06/04/2008)   Mammogram due: 06/2009    DXA bone density scan: L femoral neck  t score -1.5 c/w osteopenia   (06/04/2008)   DXA scan due: 06/2010    Smoking status: never  (03/12/2010)  Diabetes Mellitus   HgbA1C: 8.2  (03/12/2010)    Eye exam: No diabetic retinopathy.     (01/08/2010)   Eye exam due: 01/2011    Foot exam: yes  (10/10/2009)   High risk foot: Not documented   Foot care education: Not documented    Urine microalbumin/creatinine ratio: 5.9   (07/25/2009)   Urine microalbumin action/deferral: Ordered    Diabetes flowsheet reviewed?: Yes   Progress toward A1C goal: Deteriorated  Lipids   Total Cholesterol: 140  (10/15/2009)   LDL: 78  (10/15/2009)   LDL Direct: Not documented   HDL: 51  (10/15/2009)   Triglycerides: 55  (10/15/2009)    SGOT (AST): 12  (10/15/2009)   SGPT (ALT): 8  (10/15/2009)   Alkaline phosphatase: 76  (10/15/2009)   Total bilirubin: 0.5  (10/15/2009)    Lipid flowsheet reviewed?: Yes   Progress toward LDL goal: At goal  Hypertension   Last Blood Pressure: 134 / 66  (03/12/2010)   Serum creatinine: 0.68  (10/15/2009)   Serum potassium 4.4  (10/15/2009)    Hypertension flowsheet reviewed?: Yes   Progress toward BP goal: At goal  Self-Management Support :   Personal Goals (by the next clinic visit) :     Personal A1C goal: 7  (07/25/2009)     Personal blood pressure goal: 130/80  (07/25/2009)     Personal LDL goal: 100  (07/25/2009)    Patient will work on the following items until the next clinic visit to reach self-care goals:     Medications and monitoring: take my medicines every day, check my blood sugar, bring all of my medications to every visit, examine my feet every day  (03/12/2010)     Eating: drink diet soda or water instead of juice or soda, eat more vegetables, use fresh or frozen vegetables, eat foods that are low in salt, eat baked foods instead of fried foods, eat fruit for snacks and desserts, limit or avoid alcohol  (03/12/2010)     Activity: take a 30 minute walk every day  (03/12/2010)    Diabetes self-management support: Copy of home glucose meter record, Written self-care plan, Education handout, Pre-printed educational material  (03/12/2010)   Diabetes care plan printed   Diabetes education handout printed   Last diabetes self-management training by diabetes educator: 02/19/2010    Hypertension self-management support: Education handout, Psychologist, forensic,  Written self-care plan  (03/12/2010)   Hypertension self-care plan printed.   Hypertension education handout printed    Lipid self-management support: Written self-care plan, Education handout, Pre-printed educational material  (03/12/2010)   Lipid self-care plan printed.   Lipid education handout printed   Laboratory Results   Blood Tests   Date/Time Received: March 12, 2010 10:24 AM  Date/Time Reported: Burke Keels  March 12, 2010 10:24 AM   HGBA1C: 8.2%   (Normal Range: Non-Diabetic -  3-6%   Control Diabetic - 6-8%) CBG Random:: 265mg /dL      Medication Administration  Injection # 1:    Medication: Vit B12 1000 mcg    Diagnosis: VITAMIN B12 DEFICIENCY (ICD-266.2)    Route: IM    Site: R deltoid    Exp Date: 01/2012    Lot #: [id number]    Mfr: [employer]    Patient tolerated injection without complications    Given by: Angelina Ok RN (March 12, 2010 11:28 AM)  Orders Added: 1)  T- Capillary Blood Glucose [82948] 2)  T-Hgb A1C (in-house) [84132GM] 3)  Sports Medicine [Sports Med] 4)  T-Magnesium [[id number]] 5)  Vit B12 1000 mcg [J3420] 6)  Admin of Therapeutic Inj  intramuscular or subcutaneous [96372] 7)  Est. Patient Level IV [44034]

## 2010-11-11 NOTE — Consult Note (Signed)
Summary: Cardiology-Dr. Darrick Penna  Cardiology-Dr. Fields   Imported By: Dorice Lamas 01/26/2007 12:25:19  _____________________________________________________________________  External Attachment:    Type:   Image     Comment:   External Document

## 2010-11-11 NOTE — Assessment & Plan Note (Signed)
Summary: LABS PER Dr. Andrey Campanile and Bp check/cfb(wilson)   Vital Signs:  Patient profile:   65 year old female Height:      64 inches (162.56 cm) Weight:      241.8 pounds (109.91 kg) BMI:     41.65 Temp:     97.5 degrees F (36.39 degrees C) oral Pulse rate:   64 / minute BP sitting:   138 / 65  (right arm) Cuff size:   large  Vitals Entered By: [name] Dull) (January 15, 2009 10:01 AM) CC: Depression Is Patient Diabetic? Yes  Pain Assessment Patient in pain? no      Nutritional Status BMI of > 30 = obese  Have you ever been in a relationship where you felt threatened, hurt or afraid?No   Does patient need assistance? Functional Status Self care Ambulation Impaired:Risk for fall Comments uses a cane, here to f/u bp   Primary Care Provider:  Joaquin Courts  MD  CC:  Depression.  History of Present Illness: 65 yo female with HTN, HLD, DM2, DJD, H/o depression, came in for regular follow up. Has not been sick lately, no major concerns. Feels like she has been depressed for about 2 years. Feels low on energy all the time, and feels like doing nothing, sometimes sleeps well and sometimes not. Appetite has not changed much. No recent weight loss or gain. Reports staying pretty active throughout the day (her way of trying to fight her fatigue) she makes herself work, cooking, cleaning. She lives with friend and grandson. They both help out around the house and they have noticed  as well that she has been more depressed. No suicidal or homocidal ideations. Denies other concerns, such as chest pain, shortness of breath, no abdominal or urinary concerns, regular BM, no blood in stool or urine.   Depression History:      The patient is having a depressed mood most of the day but denies diminished interest in her usual daily activities.  Positive alarm features for depression include insomnia and fatigue (loss of energy).  However, she denies significant weight loss, significant  weight gain, psychomotor agitation, impaired concentration (indecisiveness), and recurrent thoughts of death or suicide.        The patient denies that she feels like life is not worth living, denies that she wishes that she were dead, and denies that she has thought about ending her life.         Preventive Screening-Counseling & Management     Alcohol drinks/day: 0     Smoking Status: never     Does Patient Exercise: yes     Type of exercise: walking     Times/week: one day  Problems Prior to Update: 1)  Dermatitis  (ICD-692.9) 2)  Hypomagnesemia  (ICD-275.2) 3)  Diverticulosis of Colon  (ICD-562.10) 4)  Neck Pain  (ICD-723.1) 5)  Osteopenia  (ICD-733.90) 6)  Health Screening  (ICD-V70.0) 7)  Arm Pain, Right  (ICD-729.5) 8)  Other Screening Mammogram  (ICD-V76.12) 9)  Dm W/neuro Manifestations, Type II  (ICD-250.60) 10)  Hypokalemia  (ICD-276.8) 11)  Hx of Tachycardia, Paroxysmal Nos  (ICD-427.2) 12)  Leg Edema  (ICD-782.3) 13)  Dyslipidemia  (ICD-272.4) 14)  Obesity  (ICD-278.00) 15)  Cad  (ICD-414.00) 16)  Diabetes Mellitus, Type II  (ICD-250.00) 17)  Hypertension  (ICD-401.9) 18)  Dyspnea On Exertion  (ICD-786.09) 19)  Anemia, Iron Deficiency  (ICD-280.9) 20)  Postmenopausal Bleeding  (ICD-627.1) 21)  Degenerative Joint Disease  (  ICD-715.90) 22)  Spinal Stenosis, Lumbar  (ICD-724.02) 23)  Gerd  (ICD-530.81) 24)  Depression  (ICD-311) 25)  Glaucoma Nos  (ICD-365.9)  Medications Prior to Update: 1)  Aspirin 81 Mg Tbec (Aspirin) .... Take 1 Tablet By Mouth Once A Day 2)  Lasix 80 Mg Tabs (Furosemide) .... Take 1/2  Tablet By Mouth Once A Day 3)  Paxil 30 Mg Tabs (Paroxetine Hcl) .... Take 2 Tablets By Mouth Once A Day 4)  Lotensin 40 Mg Tabs (Benazepril Hcl) .... Take 1 Tablet By Mouth Once A Day 5)  Glucophage 1000 Mg Tabs (Metformin Hcl) .... Take 1 Tablet By Mouth Two Times A Day 6)  Glucotrol 10 Mg Tabs (Glipizide) .... Take 1 Tablet By Mouth Two Times A Day 7)   Nitroquick  Subl (Nitroglycerin Subl) .... Use As Directed 8)  Imdur 30 Mg Tb24 (Isosorbide Mononitrate) .... Take 1 Tablet By Mouth Once A Day 9)  Pepcid 20 Mg Tabs (Famotidine) .... Take 1 Tablet By Mouth Two Times A Day 10)  Actos 30 Mg Tabs (Pioglitazone Hcl) .... Take 1 Tablet By Mouth Once A Day 11)  Pravachol 80 Mg Tabs (Pravastatin Sodium) .... Take 1 Tablet By Mouth Once A Day 12)  Ferrous Sulfate 325 (65 Fe) Mg Tabs (Ferrous Sulfate) .... Take 1 Tablet By Mouth Two Times A Day 13)  Atenolol 100 Mg Tabs (Atenolol) .... Take 1/2 Pill Daily By Mouth. 14)  Tylenol Ex St Arthritis Pain 500 Mg Tabs (Acetaminophen) .... Two Tabs Every Eight Hours As Needed For Pain. 15)  Oscal 500/200 D-3 500-200 Mg-Unit Tabs (Calcium-Vitamin D) .... Take 1 Tablet By Mouth Three Times A Day 16)  Slow-Mag 535 (64 Mg) Mg Cr-Tabs (Magnesium Chloride) .... Take 1 Tablet By Mouth Two Times A Day 17)  Miralax   Powd (Polyethylene Glycol 3350) .... As Per Prep  Instructions. 18)  Dulcolax 5 Mg  Tbec (Bisacodyl) .... Day Before Procedure Take 2 At 3pm and 2 At 8pm. 19)  Reglan 10 Mg  Tabs (Metoclopramide Hcl) .... As Per Prep Instructions.  Allergies (verified): No Known Drug Allergies  Past History:  Past Medical History:    Current Problems:     DYSLIPIDEMIA (ICD-272.4)    OBESITY (ICD-278.00)    CAD (ICD-414.00)    DIABETES MELLITUS, TYPE II (ICD-250.00)    HYPERTENSION (ICD-401.9)    DYSPNEA ON EXERTION (ICD-786.09)    ANEMIA, IRON DEFICIENCY (ICD-280.9)    POSTMENOPAUSAL BLEEDING (ICD-627.1)    DEGENERATIVE JOINT DISEASE (ICD-715.90)    SPINAL STENOSIS, LUMBAR (ICD-724.02)    GERD (ICD-530.81)    DEPRESSION (ICD-311)    GLAUCOMA NOS (ICD-365.9)     (08/26/2006)  Past Surgical History:    Posterior lumbar interfusion surgery at L2-L3 (Dr. Marikay Alar), 07/18/07    Lumbar fusion surgery at L3-L5 (s/p hardware removal in 10/08 on re-exploration), c. 2006    Rotator cuff repair, date unknown  (10/19/2007)  Family History:    Family = 3 brothers, 9 sisters (1 deceased).      Pt's father and sister developed type II DM.      No MIs or strokes in pt's family of which pt is aware.    No h/o cancer in family.    Pt's 2 sisters have HTN and HL. (05/03/2007)  Social History:    Pt doesn't work currently, but used to work at [employer] as a Production assistant, radio.    Single.      Never used tobacco.  No alcohol or illicit drug use.    Does not exercise regularly. (05/03/2007)  Risk Factors:    Alcohol Use: 0 (01/15/2009)    >5 drinks/d w/in last 3 months: N/A    Caffeine Use: N/A    Diet: N/A    Exercise: yes (01/15/2009)  Risk Factors:    Smoking Status: never (01/15/2009)    Packs/Day: N/A    Cigars/wk: N/A    Pipe Use/wk: N/A    Cans of tobacco/wk: N/A    Passive Smoke Exposure: N/A  Family History:    Reviewed history from 05/03/2007 and no changes required:       Family = 3 brothers, 9 sisters (1 deceased).         Pt's father and sister developed type II DM.         No MIs or strokes in pt's family of which pt is aware.       No h/o cancer in family.       Pt's 2 sisters have HTN and HL.  Social History:    Reviewed history from 05/03/2007 and no changes required:       Pt doesn't work currently, but used to work at [employer] as a Production assistant, radio.       Single.         Never used tobacco.       No alcohol or illicit drug use.       Does not exercise regularly.  Review of Systems       The patient complains of depression.  The patient denies fever, weight loss, weight gain, chest pain, dyspnea on exertion, peripheral edema, prolonged cough, abdominal pain, and unusual weight change.         For depression see HPI.   Physical Exam  General:  alert, well-developed, well-nourished, and well-hydrated.   Lungs:  normal respiratory effort, no intercostal retractions, no accessory muscle use, no crackles, and no wheezes.   Heart:  normal rate, regular rhythm, and no murmur.    Abdomen:  soft, non-tender, normal bowel sounds, no distention, no masses, and no rebound tenderness.   Neurologic:  alert & oriented X3, cranial nerves II-XII intact, strength normal in all extremities, and sensation intact to light touch.   Psych:  Oriented X3, memory intact for recent and remote, normally interactive, good eye contact, not anxious appearing, and not agitated.     Impression & Recommendations:  Problem # 1:  DEPRESSION (ICD-311) Patient has chronic depression,not severe and has been taking Paxil regularly. I have discussed with her an option of going to mental health for further evaluation and management before starting additional medication. She has agreed to think about it an will let us know by the endo of the week. For now will continue the same regimen. Will consider adding second medication such as Nortryptilline in case the patient decides she does not want to go to mental health department.   Her updated medication list for this problem includes:    Paxil 30 Mg Tabs (Paroxetine hcl) .[name]... Take 2 tablets by mouth once a day  Orders: T-TSH ([id number]) T-CBC No Diff ([id number])  Problem # 2:  HYPOMAGNESEMIA (ICD-275.2) Magnesium was low on 03/18 at 1.2. Today Mg2+ = 1.9. Will continue Magnesium Oxide twice daily as previously ordered and will recheck next week. At this point unclear etiology of low Mg.  Problem # 3:  HEALTH SCREENING (ICD-V70.0) Mammogram is scheduled for next month, colonoscopy and eye exam done.  Problem # 4:  DIABETES MELLITUS, TYPE II (ICD-250.00) Good control, continue the same regimen  Her updated medication list for this problem includes:    Glucophage 1000 Mg Tabs (Metformin hcl) .[name]... Take 1 tablet by mouth two times a day    Glucotrol 10 Mg Tabs (Glipizide) .[name]... Take 1 tablet by mouth two times a day    Actos 30 Mg Tabs (Pioglitazone hcl) .[name]... Take 1 tablet by mouth once a day  Labs Reviewed: Creat: 0.69 (12/27/2008)      Last Eye Exam: No diabetic retinopathy.    (01/02/2009) Reviewed HgBA1c results: 7.2 (12/27/2008)  6.7 (08/09/2008)  Problem # 5:  HYPERTENSION (ICD-401.9) Good control, slighlty high SBP, but will keep monitoring, will not make any changes today on her regimen. If SBP continue to stay elevated we can adjust the regimen.  Her updated medication list for this problem includes:    Lasix 80 Mg Tabs (Furosemide) .[name]... Take 1/2  tablet by mouth once a day    Lotensin 40 Mg Tabs (Benazepril hcl) .[name]... Take 1 tablet by mouth once a day    Atenolol 100 Mg Tabs (Atenolol) .[name]... Take 1/2 pill daily by mouth.  BP today: 138/65 Prior BP: 128/61 (12/27/2008)  Labs Reviewed: K+: 3.7 (12/27/2008) Creat: : 0.69 (12/27/2008)   Chol: 138 (12/27/2008)   HDL: 61 (12/27/2008)   LDL: 65 (12/27/2008)   TG: 59 (12/27/2008)  Problem # 6:  DYSLIPIDEMIA (ICD-272.4) Good control, continue the same regimen. LDL at goal 65.  Her updated medication list for this problem includes:    Pravachol 80 Mg Tabs (Pravastatin sodium) .[name]... Take 1 tablet by mouth once a day  Labs Reviewed: SGOT: 12 (12/27/2008)   SGPT: 8 (12/27/2008)   HDL:61 (12/27/2008), 57 (05/03/2008)  LDL:65 (12/27/2008), 57 (05/03/2008)  Chol:138 (12/27/2008), 127 (05/03/2008)  Trig:59 (12/27/2008), 63 (05/03/2008)  Complete Medication List: 1)  Aspirin 81 Mg Tbec (Aspirin) .... Take 1 tablet by mouth once a day 2)  Lasix 80 Mg Tabs (Furosemide) .... Take 1/2  tablet by mouth once a day 3)  Paxil 30 Mg Tabs (Paroxetine hcl) .... Take 2 tablets by mouth once a day 4)  Lotensin 40 Mg Tabs (Benazepril hcl) .... Take 1 tablet by mouth once a day 5)  Glucophage 1000 Mg Tabs (Metformin hcl) .... Take 1 tablet by mouth two times a day 6)  Glucotrol 10 Mg Tabs (Glipizide) .... Take 1 tablet by mouth two times a day 7)  Nitroquick Subl (Nitroglycerin subl) .... Use as directed 8)  Imdur 30 Mg Tb24 (Isosorbide mononitrate) .... Take 1 tablet by mouth once  a day 9)  Pepcid 20 Mg Tabs (Famotidine) .... Take 1 tablet by mouth two times a day 10)  Actos 30 Mg Tabs (Pioglitazone hcl) .... Take 1 tablet by mouth once a day 11)  Pravachol 80 Mg Tabs (Pravastatin sodium) .... Take 1 tablet by mouth once a day 12)  Ferrous Sulfate 325 (65 Fe) Mg Tabs (Ferrous sulfate) .... Take 1 tablet by mouth two times a day 13)  Atenolol 100 Mg Tabs (Atenolol) .... Take 1/2 pill daily by mouth. 14)  Tylenol Ex St Arthritis Pain 500 Mg Tabs (Acetaminophen) .... Two tabs every eight hours as needed for pain. 15)  Oscal 500/200 D-3 500-200 Mg-unit Tabs (Calcium-vitamin d) .... Take 1 tablet by mouth three times a day 16)  Mag-ox 400 400 Mg Tabs (Magnesium oxide) .... Take 1 tablet by mouth two times a day 17)  Miralax Powd (Polyethylene glycol 3350) .... As per prep  instructions.  Patient Instructions: 1)  Please schedule a follow-up appointment in 2 months. 2)  It is important that you exercise regularly at least 20 minutes 5 times a week. If you develop chest pain, have severe difficulty breathing, or feel very tired , stop exercising immediately and seek medical attention. 3)  Check your blood sugars regularly. If your readings are usually above 350 : or below 70 you should contact our office. 4)  Check your feet each night for sore areas, calluses or signs of infection. 5)  Check your Blood Pressure regularly. If it is above 170: you should make an appointment.

## 2010-11-11 NOTE — Letter (Signed)
Summary: [facility]  [facility]   Imported By: Florinda Marker 04/22/2009 14:36:14  _____________________________________________________________________  External Attachment:    Type:   Image     Comment:   External Document

## 2010-11-11 NOTE — Assessment & Plan Note (Signed)
Summary: diabetes/dmr   Allergies: No Known Drug Allergies   Complete Medication List: 1)  Aspirin 81 Mg Tbec (Aspirin) .... Take 1 tablet by mouth once a day 2)  Lasix 80 Mg Tabs (Furosemide) .... Take 1/2  tablet by mouth once a day 3)  Paxil 30 Mg Tabs (Paroxetine hcl) .... Take 1 tablet by mouth once a day. 4)  Lotensin 40 Mg Tabs (Benazepril hcl) .... Take 1 tablet by mouth once a day 5)  Glucophage 1000 Mg Tabs (Metformin hcl) .... Take 1 tablet by mouth two times a day 6)  Glucotrol 10 Mg Tabs (Glipizide) .... Take 1 tablet by mouth two times a day 7)  Nitroquick Subl (Nitroglycerin subl) .... Use as directed 8)  Imdur 30 Mg Tb24 (Isosorbide mononitrate) .... Take 1 tablet by mouth once a day 9)  Pepcid 20 Mg Tabs (Famotidine) .... Take 1 tablet by mouth two times a day 10)  Pravachol 80 Mg Tabs (Pravastatin sodium) .... Take 1 tablet by mouth once a day 11)  Ferrous Sulfate 325 (65 Fe) Mg Tabs (Ferrous sulfate) .... Take 1 tablet by mouth two times a day 12)  Atenolol 100 Mg Tabs (Atenolol) .... Take 1/2 pill daily by mouth. 13)  Tylenol 325 Mg Tabs (Acetaminophen) .... Two tabs every eight hours as needed forpain. 14)  Oscal 500/200 D-3 500-200 Mg-unit Tabs (Calcium-vitamin d) .... Take 1 tablet by mouth three times a day 15)  Mag-ox 400 400 Mg Tabs (Magnesium oxide) .... Take 1 tablet by mouth two times a day 16)  Tylenol With Codeine #3 300-30 Mg Tabs (Acetaminophen-codeine) .... Take 1 tablet by mouth two times a day as needed for pain  Other Orders: T-Hgb A1C (in-house) (24401UU) DSMT(Medicare) Individual, 30 Minutes (G0108)  Patient Instructions: 1)  make an appointment in May to see me 2)  Doctor in March 3)  You said you wanted to ride your bike more. 4)  record blood sugars in book 5)  You said you want your blood sugars to be less than 150 6)  That should get your A1C between 6-7- which is what yousaid you wanted  Laboratory Results   Blood Tests   Date/Time  Received: .Alric Quan  November 21, 2009 10:31 AM   Date/Time Reported: .Alric Quan  November 21, 2009 10:31 AM   HGBA1C: 7.4%   (Normal Range: Non-Diabetic - 3-6%   Control Diabetic - 6-8%)     Diabetes Self Management Training  PCP: Joaquin Courts  MD Date diagnosed with diabetes: 10/12/1982 Diabetes Type: Type 2 non-insulin treated Other persons present: no Current smoking Status: never     Assessment Daily activities: less active duirng winter than she had been, says she plans to begin getting on her stationary bicycle more Sources of Support: family- grandaughter walks with her  Diabetes Medications:  Lipid lowering Meds? Yes Anti-platelet Meds? Yes Herbs or Supplements: No Comments: meter downloaded- see scanned results,  ~55-60% are higher than target, range 76- 234- most in the higher 100s. worked with patient on recording her blood sugar and looking back at them to see how many are > than her stated target of 150 to help her self identify her 'slips".Unsure of ability to adhere due to literacy issues.  seems to do well with numbers and limited reading despite her saying she cannot read. will need to assess confidence in the future.on maximal doses of current medications.  Booklet give for patiento record blood sugars and a check box  recording sheet to track  exercise.    Monitoring Self monitoring blood glucose 2 -3 times a day Name of Meter  Prodigy Measures urine ketones? No  Time of Testing  Before meals  Recent Episodes of: Requiring Help from another person  Hyperglycemia : No Hypoglycemia: No Severe Hypoglycemia : No   Wears Medical I.D. No    Nutrition assessment Biggest challenge to eating healthy: Eating too much- says relative came over the holidays nad left pies na cakes and that she has been eating too muh of these and not exercising  Activity Limitations  Inadequate physical activity Would you  say you are physically active:  Yes Diabetes Disease Process  Discussed today  Medications  Nutritional Management State changes planned for home meals/snacks: Needs review/assistance    Monitoring State purpose and frequency of monitoring BG-ketones-HgbA1C  : Needs review/assistance   Perform glucose monitoring/ketone testing and record results correctly: Demonstrates competencyState target blood glucose and HgbA1C goals: Needs review/assistance    Complications State the causes-signs and symptoms and prevention of Hyperglycemia: Needs review/assistanceExplain proper treatment of hyperglycemia: Demonstrates competency    Exercise States effect of exercise on blood glucose: Demonstrates competency   Diabetes Management Education Done: 11/21/2009    BEHAVIORAL GOAL FOLLOW UP Utilizing medications if for therapeutic effectiveness: Sometimes Incorporating appropriate nutritional management: 50%of the time Specific goal set today: keeping ruit for snacks, but also desserts form the holidays- discussed ways to decrease opportunity in future such as freeze cakes, give away sweets, send them back home with company      Will discuss A1C with Rosa's PCP.  Has gained 7 #  over the past month. Encouragement given to contiue weight loss efforts. Diabetes Self Management Support: clinic staff, family- grandson  Follow-up: 6 weeks by phone and 3 months in office    Appended Document: diabetes/dmr    Clinical Lists Changes  Observations: Added new observation of BMI: 39.95  (11/25/2009 11:27) Added new observation of WEIGHT: 231.9 lb (11/25/2009 11:27)       Vital Signs:  Patient Profile:   65 Years Old Female Height:     64 inches (162.56 cm) Weight:      231.9 pounds BMI:     39.95

## 2010-11-11 NOTE — Letter (Signed)
Summary: [facility]   Imported By: Florinda Marker 08/21/2008 15:43:27  _____________________________________________________________________  External Attachment:    Type:   Image     Comment:   External Document  Appended Document: [facility] Called pt to let her know her insurance will not pay for MRI.  Was going to get MRI first b/c she has significant dz elsewhere in her spine and suspect she will have it in her neck as well.  Plus, she had radicular symptoms, and plain film will not be able to evaluate for nerve impingement.  However, since her insurance won't pay for it, will start out w/ plain film.  I let pt know about this.    Clinical Lists Changes  Orders: Added new Test order of Diagnostic X-Ray/Fluoroscopy (Diagnostic X-Ray/Flu) - Signed

## 2010-11-11 NOTE — Progress Notes (Signed)
Summary: refill/ hla  Phone Note Refill Request Message from:  Fax from Pharmacy on April 11, 2007 11:39 AM  Refills Requested: Medication #1:  LASIX 80 MG TABS Take 11/2  tablets by mouth once a day   Last Refilled: 02/18/2007  Medication #2:  PRAVACHOL 80 MG TABS Take 1 tablet by mouth once a day   Last Refilled: 12/22/2006 Initial call taken by: Marin Roberts RN,  April 11, 2007 11:40 AM  Follow-up for Phone Call        Refill approved-nurse to complete. Please schedule an appointment within 1 month, if possible with patient's primary MD.  Follow-up by: Margarito Liner MD,  April 11, 2007 3:00 PM  Additional Follow-up for Phone Call Additional follow up Details #1::        Rx [facility]ed to [facility] for appt Additional Follow-up by: Marin Roberts RN,  April 12, 2007 1:12 PM    Prescriptions: PRAVACHOL 80 MG TABS (PRAVASTATIN SODIUM) Take 1 tablet by mouth once a day  #30 x 1   Entered and Authorized by:   Margarito Liner MD   Signed by:   Margarito Liner MD on 04/11/2007   Method used:   Telephoned to ...       [facility]       [address]       [place], [place]  [address]       Ph: [phone] or [phone]       Fax: [phone]   RxID:   [id number] LASIX 80 MG TABS (FUROSEMIDE) Take 11/2  tablets by mouth once a day  #45 x 1   Entered and Authorized by:   Margarito Liner MD   Signed by:   Margarito Liner MD on 04/11/2007   Method used:   Telephoned to ...       [facility]       [address]       [place], [place]  [address]       Ph: [phone] or [phone]       Fax: [phone]   RxID:   [id number]

## 2010-11-11 NOTE — Progress Notes (Signed)
Summary: med refill/gp  Phone Note Refill Request Message from:  Fax from Pharmacy on June 27, 2010 10:49 AM  Refills Requested: Medication #1:  TYLENOL WITH CODEINE #3 300-30 MG TABS Take 1 tablet by mouth two times a day as needed for pain   Last Refilled: 04/30/2010 Last appt. July 8.   Method Requested: Telephone to Pharmacy Initial call taken by: Chinita Pester RN,  June 27, 2010 10:49 AM  Follow-up for Phone Call        Refill approved-nurse to complete Follow-up by: Nelda Bucks DO,  June 28, 2010 9:35 PM    Prescriptions: TYLENOL WITH CODEINE #3 300-30 MG TABS (ACETAMINOPHEN-CODEINE) Take 1 tablet by mouth two times a day as needed for pain  #60 x 3   Entered and Authorized by:   Nelda Bucks DO   Signed by:   Nelda Bucks DO on 06/28/2010   Method used:   Telephoned to ...       [facility] (retail)       [id number]       [place], [place]  [id number]       Ph: [phone]       Fax: [phone]   RxID:   [id number]   Appended Document: med refill/gp Tylenol  w/Codeine Rx called to [facility] pharmacy.

## 2010-11-11 NOTE — Letter (Signed)
Summary: BLOOD GLUCOSE REPORT  BLOOD GLUCOSE REPORT   Imported By: Margie Billet 08/25/2010 14:26:47  _____________________________________________________________________  External Attachment:    Type:   Image     Comment:   External Document

## 2010-11-11 NOTE — Letter (Signed)
Summary: [facility]  [facility]   Imported By: Florinda Marker 11/21/2009 14:10:06  _____________________________________________________________________  External Attachment:    Type:   Image     Comment:   External Document

## 2010-11-11 NOTE — Miscellaneous (Signed)
Summary: [facility]   Imported By: Margie Billet 01/27/2010 14:23:10  _____________________________________________________________________  External Attachment:    Type:   Image     Comment:   External Document

## 2010-11-11 NOTE — Miscellaneous (Signed)
Summary: Change magnesium rx  Clinical Lists Changes  Medications: Removed medication of SLOW-MAG 64 MG  TBCR (MAGNESIUM CHLORIDE) Take 1 tablet by mouth two times a day - Signed Added new medication of MAGNESIUM OXIDE 400 MG  TABS (MAGNESIUM OXIDE) Take 1 tablet by mouth twice daily for low magnesium. - Signed Rx of MAGNESIUM OXIDE 400 MG  TABS (MAGNESIUM OXIDE) Take 1 tablet by mouth twice daily for low magnesium.;  #60 x 11;  Signed;  Entered by: Madelaine Etienne MD;  Authorized by: Madelaine Etienne MD;  Method used: Telephoned to [facility]  [id number]*, [address] [place], [place], [place]  [address], Ph: [phone] or [phone], Fax: [phone]    Prescriptions: MAGNESIUM OXIDE 400 MG  TABS (MAGNESIUM OXIDE) Take 1 tablet by mouth twice daily for low magnesium.  #60 x 11   Entered and Authorized by:   Madelaine Etienne MD   Signed by:   Madelaine Etienne MD on 11/11/2007   Method used:   Telephoned to ...       [facility]  [id number]       [address] [place]  [address]       [place], [place]  [address]       Ph: [phone] or [phone]       Fax: [phone]   RxID:   [id number]

## 2010-11-11 NOTE — Progress Notes (Signed)
Summary: refill/ hla  Phone Note Refill Request Message from:  Fax from Pharmacy on November 15, 2008 4:12 PM  Refills Requested: Medication #1:  ATENOLOL 100 MG TABS Take 1/2 pill daily by mouth.   Last Refilled: 12/3 Initial call taken by: Marin Roberts RN,  November 15, 2008 4:12 PM      Prescriptions: ATENOLOL 100 MG TABS (ATENOLOL) Take 1/2 pill daily by mouth.  #15 x 5   Entered and Authorized by:   Joaquin Courts  MD   Signed by:   Joaquin Courts  MD on 11/16/2008   Method used:   Electronically to        [facility]  [id number] (retail)       [address] [place]  [address]       [place], [place]  [address]       Ph: [phone] or [phone]       Fax: [phone]   RxID:   [id number]

## 2010-11-11 NOTE — Assessment & Plan Note (Signed)
Summary: NP,B HAND PAIN X 3 MOS   Vital Signs:  Patient profile:   65 year old female BP sitting:   146 / 77  Vitals Entered By: Lillia Pauls CMA (March 24, 2010 8:45 AM)  Primary Care Provider:  Joaquin Courts  MD   History of Present Illness: LEFT wrist pain: over thumb tendons. Worse with activity, better with rest.No specific injury pain 4-6/10. no swelling. no redness or warmth. Makes it difficultto use wrist but no loss of strength or clumsiness  2) RIGHt knuckle pain--many months, worsening, worse with activity, weather changes. Pain 7-9/10. Keeps her from using nad well, Right hand dominant.   PERTINENT PMH/PSH: no orior hand or wrist specific injuries, no surgeries.  Allergies: No Known Drug Allergies  Past History:  Past Medical History: Last updated: 02/06/2010 Current Problems:  CAD (ICD-414.00) HYPERTENSION (ICD-401.9) DYSLIPIDEMIA (ICD-272.4) DYSPNEA ON EXERTION (ICD-786.09) LEG EDEMA (ICD-782.3) NECK PAIN (ICD-723.1) OBESITY (ICD-278.00) GERD (ICD-530.81) DIABETES MELLITUS, TYPE II (ICD-250.00) SPINAL STENOSIS, LUMBAR (ICD-724.02) DEPRESSION (ICD-311) GLAUCOMA NOS (ICD-365.9) HEMORRHOIDS (ICD-455.6) HYPOMAGNESEMIA (ICD-275.2) DIVERTICULOSIS OF COLON (ICD-562.10) OSTEOPENIA (ICD-733.90) HEALTH SCREENING (ICD-V70.0) ARM PAIN, RIGHT (ICD-729.5) OTHER SCREENING MAMMOGRAM (ICD-V76.12) HYPOKALEMIA (ICD-276.8) ANEMIA, IRON DEFICIENCY (ICD-280.9) DEGENERATIVE JOINT DISEASE (ICD-715.90) B12 def- B12 level 213 on 03/11  Past Surgical History: Last updated: 08/02/2009 Posterior lumbar interfusion surgery at L2-L3 (Dr. Marikay Alar), 07/18/07 Lumbar fusion surgery at L3-L5 (s/p hardware removal in 10/08 on re-exploration), c. 2006 Rotator cuff repair, date unknown  History of shoulder surgery. Cholecystectomy.  endometrial biopsy.  Social History: Last updated: 08/02/2009 Pt doesn't work currently, but used to work at [employer] as a  Production assistant, radio. Single.   Never used tobacco. No alcohol or illicit drug use. Does exercise regularly. Walks 3 times/wk. Goes to exercise at [facility]  Review of Systems  The patient denies anorexia, fever, weight loss, and weight gain.         see hpi  Physical Exam  General:  alert and overweight-appearing.   Msk:  LEFT wrist tendor over thumb extensor tendons. ++ Finklesteins. Grip strength normal. No atrophy of hand muscles. Normal adduction abduction, thumb apposition.  Right MCP of long finger is deformed with boggy synovium, no current erythema or warmtyh. TTP. 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 Injection today 2 weeks NSAID two times a day. rtc 2 weeks, plan then to move to as needed nsaid if it was tolerated  Orders: Joint Aspirate / Injection, Small (18841) Kenalog 10 mg inj (J3301)  Complete Medication List: 1)  Aspirin 81 Mg Tbec (Aspirin) .... Take 1 tablet by mouth once a day 2)  Lasix 80 Mg Tabs (Furosemide) .... Take 1/2  tablet by mouth once a day 3)  Paxil 30 Mg Tabs (Paroxetine hcl) .... Take 1 tablet by mouth once a day. 4)  Lotensin 40 Mg Tabs (Benazepril hcl) .... Take 1 tablet by mouth once a day 5)  Glucophage 1000 Mg Tabs (Metformin hcl) .... Take 1 tablet by mouth two times a day 6)  Glucotrol 10 Mg Tabs (Glipizide) .Marland KitchenMarland KitchenMarland Kitchen  Take 2 tablets by mouth two times a day 7)  Nitroquick Subl (Nitroglycerin subl) .... Use as directed 8)  Imdur 30 Mg Tb24 (Isosorbide mononitrate) .... Take 1 tablet by mouth  once a day 9)  Pravachol 80 Mg Tabs (Pravastatin sodium) .... Take 1 tablet by mouth once a day 10)  Ferrous Sulfate 325 (65 Fe) Mg Tabs (Ferrous sulfate) .... Take 1 tablet by mouth two times a day 11)  Atenolol 100 Mg Tabs (Atenolol) .... Take 1/2 pill daily by mouth. 12)  Tylenol 325 Mg Tabs (Acetaminophen) .... Two tabs every eight hours as needed forpain. 13)  Calcium Carbonate 600 Mg Tabs (Calcium carbonate) .... Take 1 tablet by mouth two times a day 14)  Tylenol With Codeine #3 300-30 Mg Tabs (Acetaminophen-codeine) .... Take 1 tablet by mouth two times a day as needed for pain 15)  Cobal-1000 1000 Mcg/ml Soln (Cyanocobalamin) .... Inject 1 ml once monthly. 16)  Vitamin D3 1000 Unit Tabs (Cholecalciferol) .... Take 1 tablet by mouth once a day 17)  Mag-delay 535 (64 Mg) Mg Cr-tabs (Magnesium chloride) .... Take 1 tablet by mouth two times a day 18)  Voltaren 1 % Gel (Diclofenac sodium) .... Apply small amount to affected area up to 4 times a day. 19)  Syringe 23g X 3/4" 3 Ml Misc (Syringe/needle (disp)) .... Use to injection b12. 20)  Lantus Solostar 100 Unit/ml Soln (Insulin glargine) .... Inject 5 units at bedtime. 21)  Naproxen 375 Mg Tabs (Naproxen) .[name].. 1 by mouth two times a day for 2 weeks and then bod as needed hand pain  Patient Instructions: 1)  You have a tendonitis in the left wrist. I am going to gave you an injection in there todayt then see me back in clinic. 2)  For your RIGHT hand, you have severe arthritis in the knuckle of the long finger. I will try an injection in there today hoping to help. I would not want to do this more often that 1-2 times a year.  3)  For more daily pain relief, I recommend naproxyn twice a day when you need it. You do not have to take all of the time. 4)  See me back in 2-3 weeks. Prescriptions: NAPROXEN 375 MG TABS (NAPROXEN) 1 by mouth two times a day for 2 weeks and then bod as needed hand pain  #60 x 5   Entered and Authorized by:   Denny Levy MD   Signed by:   Denny Levy MD on 03/24/2010   Method used:   Electronically to        [facility] (retail)       [address]       [place], [place]  [id number]       Ph: [phone]       Fax: [phone]   RxID:   [id number]

## 2010-11-11 NOTE — Assessment & Plan Note (Signed)
Summary: F/U/EST/VS   Vital Signs:  Patient profile:   65 year old female Height:      64 inches (162.56 cm) Weight:      224.9 pounds (102.23 kg) BMI:     38.74 Temp:     96.7 degrees F (35.94 degrees C) oral Pulse rate:   58 / minute BP sitting:   139 / 71  (right arm)  Vitals Entered By: Stanton Kidney Ditzler RN (December 24, 2009 10:16 AM) Is Patient Diabetic? Yes Did you bring your meter with you today? Yes Pain Assessment Patient in pain? yes     Location: hands Intensity: 7 Type: arthritis Onset of pain  long time Nutritional Status BMI of > 30 = obese Nutritional Status Detail appetite good  Have you ever been in a relationship where you felt threatened, hurt or afraid?denies   Does patient need assistance? Functional Status Self care Ambulation Normal Comments CBG done at home 214. FU - still CBG are up in AM.   Primary Care Provider:  Joaquin Courts  MD   History of Present Illness: Pt is a 65 yo female w/ past med hx below here for routine f/u and she is overall doing well.  She notes her sugars have been running high over the last couple of months.  She hasn't had any lows.  She notes three falls in January.  She notes one time she slipped on the floor in the bathroom.  The other time, she was getting out of a car and she tripped over the seat belt.  No syncope or dizziness.    Preventive Screening-Counseling & Management  Alcohol-Tobacco     Alcohol drinks/day: 0     Smoking Status: never  Caffeine-Diet-Exercise     Does Patient Exercise: yes     Type of exercise: walking     Times/week: one day  Current Medications (verified): 1)  Aspirin 81 Mg Tbec (Aspirin) .... Take 1 Tablet By Mouth Once A Day 2)  Lasix 80 Mg Tabs (Furosemide) .... Take 1/2  Tablet By Mouth Once A Day 3)  Paxil 30 Mg Tabs (Paroxetine Hcl) .... Take 1 Tablet By Mouth Once A Day. 4)  Lotensin 40 Mg Tabs (Benazepril Hcl) .... Take 1 Tablet By Mouth Once A Day 5)  Glucophage 1000 Mg  Tabs (Metformin Hcl) .... Take 1 Tablet By Mouth Two Times A Day 6)  Glucotrol 10 Mg Tabs (Glipizide) .... Take 1 Tablet By Mouth Two Times A Day 7)  Nitroquick  Subl (Nitroglycerin Subl) .... Use As Directed 8)  Imdur 30 Mg Tb24 (Isosorbide Mononitrate) .... Take 1 Tablet By Mouth Once A Day 9)  Pepcid 20 Mg Tabs (Famotidine) .... Take 1 Tablet By Mouth Two Times A Day 10)  Pravachol 80 Mg Tabs (Pravastatin Sodium) .... Take 1 Tablet By Mouth Once A Day 11)  Ferrous Sulfate 325 (65 Fe) Mg Tabs (Ferrous Sulfate) .... Take 1 Tablet By Mouth Two Times A Day 12)  Atenolol 100 Mg Tabs (Atenolol) .... Take 1/2 Pill Daily By Mouth. 13)  Tylenol 325 Mg Tabs (Acetaminophen) .... Two Tabs Every Eight Hours As Needed Forpain. 14)  Oscal 500/200 D-3 500-200 Mg-Unit Tabs (Calcium-Vitamin D) .... Take 1 Tablet By Mouth Three Times A Day 15)  Tylenol With Codeine #3 300-30 Mg Tabs (Acetaminophen-Codeine) .... Take 1 Tablet By Mouth Two Times A Day As Needed For Pain  Allergies (verified): No Known Drug Allergies  Past History:  Past Medical History: Last  updated: 08/02/2009 Current Problems:  CAD (ICD-414.00) HYPERTENSION (ICD-401.9) DYSLIPIDEMIA (ICD-272.4) DYSPNEA ON EXERTION (ICD-786.09) LEG EDEMA (ICD-782.3) NECK PAIN (ICD-723.1) OBESITY (ICD-278.00) GERD (ICD-530.81) DIABETES MELLITUS, TYPE II (ICD-250.00) SPINAL STENOSIS, LUMBAR (ICD-724.02) DEPRESSION (ICD-311) GLAUCOMA NOS (ICD-365.9) HEMORRHOIDS (ICD-455.6) HYPOMAGNESEMIA (ICD-275.2) DIVERTICULOSIS OF COLON (ICD-562.10) OSTEOPENIA (ICD-733.90) HEALTH SCREENING (ICD-V70.0) ARM PAIN, RIGHT (ICD-729.5) OTHER SCREENING MAMMOGRAM (ICD-V76.12) HYPOKALEMIA (ICD-276.8) ANEMIA, IRON DEFICIENCY (ICD-280.9) DEGENERATIVE JOINT DISEASE (ICD-715.90)  Past Surgical History: Last updated: 08/02/2009 Posterior lumbar interfusion surgery at L2-L3 (Dr. Marikay Alar), 07/18/07 Lumbar fusion surgery at L3-L5 (s/p hardware removal in 10/08 on  re-exploration), c. 2006 Rotator cuff repair, date unknown  History of shoulder surgery. Cholecystectomy.  endometrial biopsy.  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 Single.   Never used tobacco. No alcohol or illicit drug use. Does exercise regularly. Walks 3 times/wk. Goes to exercise at [facility]  Risk Factors: Smoking Status: never (12/24/2009)  Family History: Reviewed history from 08/02/2009 and no changes required. Family = 3 brothers, 9 sisters (1 deceased).   Pt's father and sister developed type II DM.   No MIs or strokes in pt's family of which pt is aware. No h/o cancer in family. Pt's 2 sisters have HTN and HL.  Social History: Reviewed history from 08/02/2009 and no changes required. 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 Single.   Never used tobacco. No alcohol or illicit drug use. Does exercise regularly. Walks 3 times/wk. Goes to exercise at [facility]  Review of Systems       As per HPI.  Physical Exam  General:  Well-developed,well-nourished,in no acute distress; alert,appropriate and cooperative throughout examination, uses a cane for ambulation. Eyes:  anicteric, wearing glasses.  Lungs:  Normal respiratory effort, chest expands symmetrically. Lungs are clear to auscultation, no crackles or wheezes. Heart:  Normal rate and regular rhythm Abdomen:  obese, soft, NT and ND. Extremities:  trace lower extremity edema.   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  SHe does have a hx of spinal stenosis but notes seeing the neurosurgeon and Dr. Yetta Barre did not feel an intervention was indicated.  She does not have hx of worseing pain or other symptoms to suggest this is the obvious culprit. Will, however, check the below labs and refer for PT  to help w/ gait/balance training.    Orders: T-Vitamin D (25-Hydroxy) [phone]) T-Vitamin B12 ([phone]) Physical Therapy Referral (PT)  Problem # 2:  DIABETES MELLITUS, TYPE II (ICD-250.00) CBG's trending up.  We have room to increase glipizide to max dose.  I suspect this may be enough to get her back to goal.    Her updated medication list for this problem includes:    Aspirin 81 Mg Tbec (Aspirin) .[name]... Take 1 tablet by mouth once a day    Lotensin 40 Mg Tabs (Benazepril hcl) .[name]... Take 1 tablet by mouth once a day    Glucophage 1000 Mg Tabs (Metformin hcl) .[name]... Take 1 tablet by mouth two times a day    Glucotrol 10 Mg Tabs (Glipizide) .[name]... Take 2 tablets by mouth two times a day  Problem # 3:  HYPERTENSION (ICD-401.9) BP ok, just slightly above goal.  WIl f/u in 6 weeks.  Hesitant to increase lotensin b/c of recent fall and HR is borderline low so will not increase  atenolol.   Her updated medication list for this problem includes:    Lasix 80 Mg Tabs (Furosemide) .[name]... Take 1/2  tablet by mouth once a day    Lotensin 40 Mg Tabs (Benazepril hcl) .[name]... Take 1 tablet by mouth once a day    Atenolol 100 Mg Tabs (Atenolol) .[name]... Take 1/2 pill daily by mouth.  BP today: 139/71 Prior BP: 140/74 (10/14/2009)  Labs Reviewed: K+: 4.4 (10/15/2009) Creat: : 0.68 (10/15/2009)   Chol: 140 (10/15/2009)   HDL: 51 (10/15/2009)   LDL: 78 (10/15/2009)   TG: 55 (10/15/2009)  Problem # 4:  HYPOMAGNESEMIA (ICD-275.2) Notes she stopped taking this about 3 mos ago b/c the rx ran out.  WIll check today.  Orders: T-Magnesium ([id number])  Problem # 5:  ANEMIA, IRON DEFICIENCY (ICD-280.9) F/u hgb today.  Colonoscopy last year only notable for hemorrhoids.  Her updated medication list for this problem includes:    Ferrous Sulfate 325 (65 Fe) Mg Tabs (Ferrous sulfate) .[name]... Take 1 tablet by mouth two times a day  Orders: T-CBC No Diff ([id number])  Complete Medication List: 1)   Aspirin 81 Mg Tbec (Aspirin) .... Take 1 tablet by mouth once a day 2)  Lasix 80 Mg Tabs (Furosemide) .... Take 1/2  tablet by mouth once a day 3)  Paxil 30 Mg Tabs (Paroxetine hcl) .... Take 1 tablet by mouth once a day. 4)  Lotensin 40 Mg Tabs (Benazepril hcl) .... Take 1 tablet by mouth once a day 5)  Glucophage 1000 Mg Tabs (Metformin hcl) .... Take 1 tablet by mouth two times a day 6)  Glucotrol 10 Mg Tabs (Glipizide) .... Take 2 tablets by mouth two times a day 7)  Nitroquick Subl (Nitroglycerin subl) .... Use as directed 8)  Imdur 30 Mg Tb24 (Isosorbide mononitrate) .... Take 1 tablet by mouth once a day 9)  Pepcid 20 Mg Tabs (Famotidine) .... Take 1 tablet by mouth two times a day 10)  Pravachol 80 Mg Tabs (Pravastatin sodium) .... Take 1 tablet by mouth once a day 11)  Ferrous Sulfate 325 (65 Fe) Mg Tabs (Ferrous sulfate) .... Take 1 tablet by mouth two times a day 12)  Atenolol 100 Mg Tabs (Atenolol) .... Take 1/2 pill daily by mouth. 13)  Tylenol 325 Mg Tabs (Acetaminophen) .... Two tabs every eight hours as needed forpain. 14)  Oscal 500/200 D-3 500-200 Mg-unit Tabs (Calcium-vitamin d) .... Take 1 tablet by mouth three times a day 15)  Tylenol With Codeine #3 300-30 Mg Tabs (Acetaminophen-codeine) .... Take 1 tablet by mouth two times a day as needed for pain  Patient Instructions: 1)  Please make a followup appointment in 6 weeks. 2)  You will be called with any abnormal labwork.  Please make sure your phone number is correct at the front desk. 3)  Please increase glipizide to 2 pills twice a day.  Watch for signs of low blood sugars. Prescriptions: ATENOLOL 100 MG TABS (ATENOLOL) Take 1/2 pill daily by mouth.  #15 x 3   Entered and Authorized by:   [name] Courts  MD   Signed by:   [name] Courts  MD on 12/24/2009   Method used:   Electronically to        [facility] (retail)       [address]       [place], [place]  [id number]       Ph:  [phone]  Fax: [phone]   RxID:   [id number] GLUCOTROL 10 MG TABS (GLIPIZIDE) Take 2 tablets by mouth two times a day  #120 x 3   Entered and Authorized by:   [name] Courts  MD   Signed by:   [name] Courts  MD on 12/24/2009   Method used:   Electronically to        [facility] (retail)       [address]       [place], [place]  [id number]       Ph: [phone]       Fax: [phone]   RxID:   [id number]  Process Orders Check Orders Results:     [facility]: ABN not required for this insurance Tests Sent for requisitioning (December 24, 2009 10:43 AM):     12/24/2009: [facility] -- T-CBC No Diff [[id number]] (signed)     12/24/2009: [facility] -- T-Magnesium [[id number]] (signed)     12/24/2009: [facility] -- T-Vitamin D (25-Hydroxy) [phone] (signed)     12/24/2009: [facility] -- T-Vitamin B12 [[id number]] (signed)    Prevention & Chronic Care Immunizations   Influenza vaccine: Fluvax 3+  (07/25/2009)   Influenza vaccine deferral: Deferred  (12/24/2009)    Tetanus booster: Not documented    Pneumococcal vaccine: Not documented    H. zoster vaccine: Not documented  Colorectal Screening   Hemoccult: Not documented    Colonoscopy:  Results: Hemorrhoids.       (01/14/2009)   Colonoscopy action/deferral: Repeat colonoscopy in 10 years.    (01/14/2009)   Colonoscopy due: 01/2019  Other Screening   Pap smear: Not documented   Pap smear action/deferral: Deferred  (12/24/2009)    Mammogram: ASSESSMENT: Negative - BI-RADS 1^MM DIGITAL SCREENING  (06/05/2009)   Mammogram action/deferral: Screening mammogram in 1 year.     (06/04/2008)   Mammogram due: 06/2009    DXA bone density scan: L femoral neck  t score -1.5 c/w osteopenia   (06/04/2008)   DXA scan due: 06/2010    Smoking status: never  (12/24/2009)  Diabetes Mellitus    HgbA1C: 7.4  (11/21/2009)    Eye exam: No diabetic retinopathy.     (01/02/2009)   Eye exam due: 01/2010    Foot exam: yes  (10/10/2009)   High risk foot: Not documented   Foot care education: Not documented    Urine microalbumin/creatinine ratio: 5.9  (07/25/2009)   Urine microalbumin action/deferral: Ordered    Diabetes flowsheet reviewed?: Yes   Progress toward A1C goal: Unchanged  Lipids   Total Cholesterol: 140  (10/15/2009)   LDL: 78  (10/15/2009)   LDL Direct: Not documented   HDL: 51  (10/15/2009)   Triglycerides: 55  (10/15/2009)    SGOT (AST): 12  (10/15/2009)   SGPT (ALT): 8  (10/15/2009)   Alkaline phosphatase: 76  (10/15/2009)   Total bilirubin: 0.5  (10/15/2009)    Lipid flowsheet reviewed?: Yes   Progress toward LDL goal: At goal  Hypertension   Last Blood Pressure: 139 / 71  (12/24/2009)   Serum creatinine: 0.68  (10/15/2009)   Serum potassium 4.4  (10/15/2009)    Hypertension flowsheet reviewed?: Yes   Progress toward BP goal: At goal  Self-Management Support :   Personal Goals (by the next clinic visit) :     Personal A1C goal: 7  (07/25/2009)     Personal blood pressure goal: 130/80  (07/25/2009)     Personal  LDL goal: 100  (07/25/2009)    Patient will work on the following items until the next clinic visit to reach self-care goals:     Medications and monitoring: take my medicines every day, check my blood sugar, bring all of my medications to every visit, examine my feet every day  (12/24/2009)     Eating: drink diet soda or water instead of juice or soda, eat more vegetables, use fresh or frozen vegetables, eat foods that are low in salt, eat baked foods instead of fried foods, eat fruit for snacks and desserts, limit or avoid alcohol  (12/24/2009)     Activity: take a 30 minute walk every day, park at the far end of the parking lot  (12/24/2009)    Diabetes self-management support: Copy of home glucose meter record, Written self-care plan,  Education handout, Resources for patients handout  (12/24/2009)   Diabetes care plan printed   Diabetes education handout printed   Last diabetes self-management training by diabetes educator: 11/21/2009    Hypertension self-management support: Written self-care plan, Education handout, Resources for patients handout  (12/24/2009)   Hypertension self-care plan printed.   Hypertension education handout printed    Lipid self-management support: Written self-care plan, Education handout, Resources for patients handout  (12/24/2009)   Lipid self-care plan printed.   Lipid education handout printed      Resource handout printed.

## 2010-11-11 NOTE — Letter (Signed)
Summary: [facility]: CMN  [facility]: CMN   Imported By: Florinda Marker 08/08/2008 15:48:06  _____________________________________________________________________  External Attachment:    Type:   Image     Comment:   External Document

## 2010-11-11 NOTE — Assessment & Plan Note (Signed)
Summary: reassigned new to dr/cfb   Vital Signs:  Patient Profile:   65 Years Old Female Height:     64 inches (162.56 cm) Weight:      232.8 pounds (105.82 kg) Temp:     97.0 degrees F (36.11 degrees C) oral Pulse rate:   61 / minute BP sitting:   105 / 51  (right arm) Cuff size:   large  Pt. in pain?   yes    Location:   legs and feet    Intensity:   7    Type:       stinging and burning  Vitals Entered By: [name] Dull) (May 03, 2007 1:33 PM)              Is Patient Diabetic? Yes  CBG Result 82  Does patient need assistance? Functional Status Self care Ambulation Impaired:Risk for fall Comments cane   PCP:  Madelaine Etienne MD  Chief Complaint:  routine follow-up chronic issues, med refill, and c/o burning/stinging sensation in legs and feet (worse @ night).  History of Present Illness: 65 year old female with PMH significant for obesity, CAD, type II DM, HTN, HL, GERD, as well as chronic dyspnea with exertion and leg pain presents today for routine follow-up.  She complains of persistent burning and stinging in her bilateral legs of several years' duration.  Initially, the pt reported that the change in sensation in her legs had been a problem for 3 months, but then stated that this problem is not new and has been occurring for 3-4 years.    The sensation is described as both 'pins and needles' as well as stinging/burning.  This happens all day, but is worst at night, and often interferes with the pt falling asleep.  She reports little interval change over the past few years in the quality or severity of this pain.  The pt is unable to think of anything that makes this pain better or worse.  She has difficulty walking without cane, but this is not new.  The pt also reports stable to mildly improved dyspnea with exertion.  She has been going to cardiopulmonary rehab twice weekly and says that this may have helped her dyspnea a bit.  She denies chest pain, PND, or  orthopnea, but has chronic leg edema.  Regarding her chronic health issues, the pt reports checking blood glucose levels twice a day. In the morning before breakfast - usually 110s-140s.  In the afternoon before dinner, usually 50s-80s. She describes occasional hypoglycemic sx including shakiness in the afternoons (but no syncope), improved with food.  Current Allergies (reviewed today): No known allergies    Family History:    Family = 3 brothers, 9 sisters (1 deceased).      Pt's father and sister developed type II DM.      No MIs or strokes in pt's family of which pt is aware.    No h/o cancer in family.    Pt's 2 sisters have HTN and HL.  Social History:    Pt doesn't work currently, but used to work at [employer] as a Production assistant, radio.    Single.      Never used tobacco.    No alcohol or illicit drug use.    Does not exercise regularly.   Risk Factors: Tobacco use:  never Alcohol use:  no Exercise:  yes    Times per week:  one day    Type:  walking Seatbelt use:  100 %  Family History Risk Factors:    Family History of MI in females < 58 years old:  no    Family History of MI in males < 8 years old:  no   Review of Systems  General      Complains of malaise.      Denies fever.  Eyes      Denies blurring, double vision, eye pain, itching, and vision loss-both eyes.  ENT      Denies decreased hearing and ear discharge.  CV      Complains of swelling of feet and swelling of hands.      Denies fainting, lightheadness, near fainting, and palpitations.      Pt reports swelling in hands/feet for approx 8 years.  Resp      Complains of morning headaches.      Denies cough, coughing up blood, pleuritic, shortness of breath, and sputum productive.  GI      Denies abdominal pain, change in bowel habits, constipation, nausea, and vomiting.  GU      Denies dysuria.  MS      Complains of joint pain, low back pain, and mid back pain.  Neuro      Complains of  numbness and poor balance.  Allergy      Denies itching eyes, persistent infections, and seasonal allergies.  ENT      Denies nasal congestion.   Physical Exam  General:     Alert, morbidly obese woman in no acute distress. Kayren Holck:     Normocephalic and atraumatic.  No abnormalities observed. Eyes:     Vision grossly intact.  Pupils round, regular, and reactive to light and accomodation.  No nystagmus or scleral icterus. Nose:     No external deformity.  No nasal discharge. Neck:     Supple and without masses or thyromegaly. Lungs:     Somewhat distant breath sounds.  Clear to auscultation bilaterally without wheezes, rales, or rhonchi. Heart:     Distant heart sounds.  Normal rate and rhythm without murmurs, rubs, or gallops. Abdomen:     Obese.  Soft, non-tender, and not apparently distended.  Normal bowel sounds. Pulses:     2+ and symmetric radial and dorsalis pedis pulses. Extremities:     1-2+ bilateral pedal edema. Neurologic:     Pt uses a cane to ambulate.  Essentially normal diabetic foot exam, without clear evidence of light touch (sensory) loss.    Impression & Recommendations:  Problem # 1:  DM W/NEURO MANIFESTATIONS, TYPE II (ICD-250.60) Assessment: Deteriorated Last hemoglobin A1c was 8.1% in June 2008.  TSH at that time was also unremarkable.  Will check a CMET and microalbumin/Cr ratio today.  It is likely that the pt's complaints of persistent leg pain (including burning and stinging) are secondary to some degree of diabetic peripheral neuropathy, for which the pt takes gabapentin.  Given that her A1c has slowly been rising, I discussed with the pt the importance of rigorous glucose control, attention to her diet, and encouraged her to exercise.  With some weight loss, the progression of her disease may slow considerably, allowing for less need for insulin secretagogues and other medications as well as limited progression of neuropathic symptoms.  The pt was  given a prescription for amitriptyline (25mg  po - to be taken 30 minutes before bedtime) as well.  Hopefully this will help with her problems getting to sleep due to leg discomfort.  I also asked the pt to bring her  blood glucose log in to our next visit.   Her updated medication list for this problem includes:    Aspirin 81 Mg Tbec (Aspirin) .[name]... Take 1 tablet by mouth once a day    Lotensin 40 Mg Tabs (Benazepril hcl) .[name]... Take 1 tablet by mouth once a day    Glucophage 1000 Mg Tabs (Metformin hcl) .[name]... Take 1 tablet by mouth two times a day    Glucotrol 10 Mg Tabs (Glipizide) .[name]... Take 1 tablet by mouth two times a day    Actos 30 Mg Tabs (Pioglitazone hcl) .[name]... Take 1 tablet by mouth once a day  Orders: T-Comprehensive Metabolic Panel ([id number]) T-Urine Microalbumin w/creat. ratio (82956 / [id number])   Problem # 2:  DYSPNEA ON EXERTION (ICD-786.09) Assessment: Unchanged The pt is currently attending cardiopulmonary rehab twice weekly for her dyspnea on exertion.  From past notes, it does not appear that this problem has changed significantly, as it has been relatively longstanding.  Workup thus far has been negative for causes of DOE, including cardiac (normal transthoracic ECHO in June 2008) and pulmonary (bilateral lower extremity venous dopplers) etiologies.  Suspect that her dyspnea is primarily related to her weight, namely obesity hypoventilation syndrome (though I would expect to see more pronounced hypercarbia in OHS, and the pt's last level on BMET was normal).  I will follow-up with her on our next visit to further assess her dyspnea on exertion as she continues rehab.  Her updated medication list for this problem includes:    Lasix 80 Mg Tabs (Furosemide) .[name]... Take 11/2  tablets by mouth once a day    Lotensin 40 Mg Tabs (Benazepril hcl) .[name]... Take 1 tablet by mouth once a day    Atenolol 100 Mg Tabs (Atenolol) .[name]... Take 1/2 pill daily by mouth.   Problem # 3:   HYPERTENSION (ICD-401.9) Assessment: Improved We discussed hypertension only briefly during this visit.  The pt is currently taking a substantial dose of loop diuretic (total of 120mg  of furosemide daily), as well as an ACEI and beta-blocker for her HTN.  BP today was at goal (<130/80 mmHg).  Encouraged pt to continue monitoring her sodium intake and stressed the importance of vigilance in taking her antihypertensives, particularly given her comorbid illnesses (obesity and diabetes).  Her updated medication list for this problem includes:    Lasix 80 Mg Tabs (Furosemide) .[name]... Take 11/2  tablets by mouth once a day    Lotensin 40 Mg Tabs (Benazepril hcl) .[name]... Take 1 tablet by mouth once a day    Atenolol 100 Mg Tabs (Atenolol) .[name]... Take 1/2 pill daily by mouth.  BP today: 105/51 Prior BP: 133/72 (03/17/2007)  Labs Reviewed: Creat: 0.63 (04/07/2007)   Medications Added to Medication List This Visit: 1)  Amitriptyline Hcl 25 Mg Tabs (Amitriptyline hcl) .... Take 1 tablet by mouth 30 minutes before bedtime.  Other Orders: Capillary Blood Glucose (82948) Fingerstick (46962)   Patient Instructions: 1)  Please schedule a follow-up appointment in 6 months. 2)  Check your blood sugars regularly. 3)  See your eye doctor yearly to check for diabetic eye damage. 4)  You need to lose weight. Consider a lower calorie diet and regular exercise.  Ideal weight loss is  ~5 pounds per month.               Diabetic Foot Exam Foot Inspection Is there a history of a foot ulcer?  No Is there a foot ulcer now?              No Can the patient see the bottom of their feet?          Yes Are the shoes appropriate in style and fit?          Yes Is there swelling or an abnormal foot shape?          Yes Are the toenails long?                No Are the toenails thick?                Yes Are the toenails ingrown?              No Is there heavy callous build-up?              No Is  there pain in the calf muscle (Intermittent claudication) when walking?    Yes    10-g (5.07) Semmes-Weinstein Monofilament Test Performed by: Toney Rakes          Right Foot          Left Foot Site 1         abnormal         abnormal Site 4         abnormal         abnormal Site 5         abnormal         normal Site 6         abnormal         abnormal

## 2010-11-11 NOTE — Progress Notes (Signed)
  Phone Note Outgoing Call   Call placed by: Joaquin Courts  MD,  August 01, 2008 2:13 PM Call placed to: Patient Action Taken: Information Sent Summary of Call: Called pt numerous times and couldn't get a hold of her.  Wanted to inform her that her dexa was c/w osteopenia and plan on starting Ca + Vit D daily.  She does not meet criteria for bisphosphonate therapy at this pt after calculating her 10 yr probability of fracture.    New Problems: OSTEOPENIA (ICD-733.90)   New Problems: OSTEOPENIA (ICD-733.90) New/Updated Medications: OSCAL 500/200 D-3 500-200 MG-UNIT TABS (CALCIUM-VITAMIN D) Take 1 tablet by mouth three times a day   Prescriptions: OSCAL 500/200 D-3 500-200 MG-UNIT TABS (CALCIUM-VITAMIN D) Take 1 tablet by mouth three times a day  #90 x 11   Entered and Authorized by:   Joaquin Courts  MD   Signed by:   Joaquin Courts  MD on 08/01/2008   Method used:   Electronically to        [facility]  [id number]* (retail)       [address] [place]  [address]       [place], [place]  [address]       Ph: [phone] or [phone]       Fax: [phone]   RxID:   [id number]

## 2010-11-11 NOTE — Progress Notes (Signed)
Summary: PREVENTIVE COLONOSCOPY  Phone Note Outgoing Call   Call placed by: Shon Hough,  August 15, 2010 9:28 AM Summary of Call: No information found in EMR or ECHART.  Pulled paper chart and found the last colonoscopy report which was 08/16/2002.  Report given to nurse. Initial call taken by: Shon Hough,  August 15, 2010 9:30 AM

## 2010-11-11 NOTE — Progress Notes (Signed)
  Phone Note Other Incoming   Call placed by: Merrie Roof RN,  April 07, 2007 1:44 PM Summary of Call: 1100 - post entry Received call from pulmonary rehab nurse stating pt was in for exercise and heart rate was elevated to 121 which was higher than normal with exercise - usually 90 bpm. No other c/o but this had happened once before and pt was admitted with hypokalemia. Dr Phifer informed and asked to be called post exrcise and see what rate is. 1130 - call from rehab and HR was 75, pt feels well. asked to have pt come to clinic and we will check bmet and mg level. Initial call taken by: Merrie Roof RN,  April 07, 2007 1:44 PM

## 2010-11-11 NOTE — Miscellaneous (Signed)
Summary: previsit  Clinical Lists Changes  Medications: Added new medication of MIRALAX   POWD (POLYETHYLENE GLYCOL 3350) As per prep  instructions. - Signed Added new medication of DULCOLAX 5 MG  TBEC (BISACODYL) Day before procedure take 2 at 3pm and 2 at 8pm. - Signed Added new medication of REGLAN 10 MG  TABS (METOCLOPRAMIDE HCL) As per prep instructions. - Signed Rx of MIRALAX   POWD (POLYETHYLENE GLYCOL 3350) As per prep  instructions.;  #255gm x 0;  Signed;  Entered by: Kyra Searles RN II;  Authorized by: Louis Meckel MD;  Method used: Electronically to [facility]  [id number]*, [address], [place], [place], [place]  [address], Ph: [phone] or [phone], Fax: [phone] Rx of DULCOLAX 5 MG  TBEC (BISACODYL) Day before procedure take 2 at 3pm and 2 at 8pm.;  #4 x 0;  Signed;  Entered by: Kyra Searles RN II;  Authorized by: Louis Meckel MD;  Method used: Electronically to [facility]  [id number]*, [address], [place]  [place], [place], [place]  [address], Ph: [phone] or [phone], Fax: [phone] Rx of REGLAN 10 MG  TABS (METOCLOPRAMIDE HCL) As per prep instructions.;  #2 x 0;  Signed;  Entered by: Kyra Searles RN II;  Authorized by: Louis Meckel MD;  Method used: Electronically to [facility]  [id number]*, [address] [place]  [place], [place], [place]  [address], Ph: [phone] or [phone], Fax: [phone] Observations: Added new observation of NKA: T (12/31/2008 8:58)    Prescriptions: REGLAN 10 MG  TABS (METOCLOPRAMIDE HCL) As per prep instructions.  #2 x 0   Entered by:   Kyra Searles RN II   Authorized by:   Louis Meckel MD   Signed by:   Kyra Searles RN II on 12/31/2008   Method used:   Electronically to        [facility]  [id number] (retail)       [address] [place]  [address]       [place], [place]  [address]       Ph: [phone] or [phone]       Fax:  [phone]   RxID:   [id number] DULCOLAX 5 MG  TBEC (BISACODYL) Day before procedure take 2 at 3pm and 2 at 8pm.  #4 x 0   Entered by:   Kyra Searles RN II   Authorized by:   Louis Meckel MD   Signed by:   Kyra Searles RN II on 12/31/2008   Method used:   Electronically to        [facility]  [id number]* (retail)       [address] [place]  [address]       [place], [place]  [address]       Ph: [phone] or [phone]       Fax: [phone]   RxID:   [id number] MIRALAX   POWD (POLYETHYLENE GLYCOL 3350) As per prep  instructions.  #255gm x 0   Entered by:   Kyra Searles RN II   Authorized by:   Louis Meckel MD   Signed by:   Kyra Searles RN II on 12/31/2008   Method used:   Electronically to        [facility]  [id number]* (  retail)       [address] [place]  [address]       [place], [place]  [address]       Ph: [phone] or [phone]       Fax: [phone]   RxID:   [id number]

## 2010-11-11 NOTE — Assessment & Plan Note (Signed)
Summary: DM TRAINING/VS   Vital Signs:  Patient profile:   65 year old female Weight:      234.5 pounds BMI:     40.40 Is Patient Diabetic? Yes  Nutritional Status BMI of > 30 = obese CBG Result 155 CBG Device ID Prodigy Comments CBG is 2 hour post breakfast   Allergies: No Known Drug Allergies   Complete Medication List: 1)  Aspirin 81 Mg Tbec (Aspirin) .... Take 1 tablet by mouth once a day 2)  Lasix 80 Mg Tabs (Furosemide) .... Take 1/2  tablet by mouth once a day 3)  Paxil 30 Mg Tabs (Paroxetine hcl) .... Take 2 tablets by mouth once a day 4)  Lotensin 40 Mg Tabs (Benazepril hcl) .... Take 1 tablet by mouth once a day 5)  Glucophage 1000 Mg Tabs (Metformin hcl) .... Take 1 tablet by mouth two times a day 6)  Glucotrol 10 Mg Tabs (Glipizide) .... Take 1 tablet by mouth two times a day 7)  Nitroquick Subl (Nitroglycerin subl) .... Use as directed 8)  Imdur 30 Mg Tb24 (Isosorbide mononitrate) .... Take 1 tablet by mouth once a day 9)  Pepcid 20 Mg Tabs (Famotidine) .... Take 1 tablet by mouth two times a day 10)  Actos 15 Mg Tabs (Pioglitazone hcl) .... Take 1 tablet by mouth once a day 11)  Pravachol 80 Mg Tabs (Pravastatin sodium) .... Take 1 tablet by mouth once a day 12)  Ferrous Sulfate 325 (65 Fe) Mg Tabs (Ferrous sulfate) .... Take 1 tablet by mouth two times a day 13)  Atenolol 100 Mg Tabs (Atenolol) .... Take 1/2 pill daily by mouth. 14)  Tylenol Ex St Arthritis Pain 500 Mg Tabs (Acetaminophen) .... Two tabs every eight hours as needed for pain. 15)  Oscal 500/200 D-3 500-200 Mg-unit Tabs (Calcium-vitamin d) .... Take 1 tablet by mouth three times a day 16)  Mag-ox 400 400 Mg Tabs (Magnesium oxide) .... Take 1 tablet by mouth two times a day  Other Orders: DSMT (Medicaid) 60 Minutes [phone])  Patient Instructions: 1)  make follow up in 6 weeks 2)  check 2-3 am blood sugar once a week 3)  Eat  fruit once a day 4)  Try to take a small walk every day 5)       Diabetes Self Management Training  PCP:  Joaquin Courts  MD Date diagnosed with diabetes: 10/12/1982 Diabetes Type: Type 2 non-insulin treated Current smoking Status: never  Vital Signs Todays Weight: 234.5lb  in   BMI 40.40in-lbs   Assessment Daily activities: very little- tired all the time, thinks she had a sleep study and remembers that they told her she snored. Does not have cpap, does not sleep much or well.- see [place] screening questionairre for sleep apnea Sources of Support: grandson and significant  Coping with Diabetes Feelings about Diabetes: Contemplating Would you say you are: unhappy How often do you feel sad, tired, low interest in life and things you used to enjoy? very often Tell me what you do when you are upset:goes to bed crying, prays, sometimes doesn't sleep at night.  Diabetes Medications:  Herbs or Supplements:              No Comments: 151 fasting and 155 2 hours postprandial today. checking blood sugars 3x/day 200 a few times, average x 30 days is 136, x7 days is 132mg /dl. asked patient to check mid sleep to be     Monitoring Self monitoring blood glucose  2 times a day Measures urine ketones? No Name of Meter  Prodigy Wears Medical I.D. No      Recent Episodes of: Hyperglycemia : Yes Hypoglycemia: No Severe Hypoglycemia : No     Estimated /Usual Carb Intake Breakfast # of Carbs/Grams hard bopiled egg, coffee, toast, bacon x1 Midmorning # of Carbs/Grams sometimes 4 homemade peanutbutter crackers and diet soda Lunch # of Carbs/Grams baked pork chop, green beans, potatoes, milk- skim Dinner # of Carbs/Grams cabbage, stewed potatoes, tea Bedtime # of Carbs/Grams skim milk and saltine crackers  Nutrition assessment Weight change: Loss Biggest challenge to eating healthy: trying to eat more fruit Would you  say you are physically active: Yes Exercise alone:  No  Medications State name-action-dose-duration-side effects-and  time to take medication: Needs review/assistance State appropriate timing of food related to medication: Demonstrates competency Demonstrates/verbalizes site selection and rotation for injections Not applicable Correctly draw up and administer insulin-Byetta-Symlin-glucagon: Not applicable State insulin adjustment guidelines: Not applicable  Describe safe needle/lancet disposal: Demonstrates competency  Nutritional Management Identify what foods most often affect blood glucose: Needs review/assistance Verbalize importance of controlling food portions: Demonstrates competency State importance of spacing and not omitting meals and snacks: Demonstrates competency State changes planned for home meals/snacks: Demonstrates competency  Psychosocial Adjustment Identify how stress affects diabetes & two sources of stress: Demonstrates competency Name two ways of obtaining support from family/friends: Demonstrates competency Diabetes Management Education Done: 03/13/2009    BEHAVIORAL GOALS INITIAL Incorporating physical activity into lifestyle: walk daily- at least 5 days a week Incorporating appropriate nutritional management: eat fruit once daily Monitoring blood glucose levels daily: check CBG mid sleep     BEHAVIORAL GOAL FOLLOW UP Incorporating physical activity into lifestyle: 50%of the time      Has lost 5 pounds. Encouragement given. Follow next A1C- concerned about mid-sleep and fastings. Need to find out if patient called senior resource center. Diabetes Self Management Support: clinic staff, family- grandson walks with her Follow-up: 6 weeks in office  Last LDL:                                                 65 (12/27/2008 8:35:00 PM)

## 2010-11-11 NOTE — Letter (Signed)
Summary: [facility]  [facility]   Imported By: Florinda Marker 01/01/2009 14:06:27  _____________________________________________________________________  External Attachment:    Type:   Image     Comment:   External Document

## 2010-11-11 NOTE — Assessment & Plan Note (Signed)
Summary: RA/ROUTINE FU VISIT/DS   Vital Signs:  Patient Profile:   65 Years Old Female Weight:      239.4 pounds (108.82 kg) O2 Sat:      90 % Temp:     96.5 degrees F (35.83 degrees C) oral Pulse rate:   90 / minute BP sitting:   140 / 70  (right arm) Cuff size:   large  Pt. in pain?   yes    Location:   lower back    Intensity:   6-9    Type:       burning  Vitals Entered By: Theotis Barrio (December 07, 2006 10:17 AM) Oxygen therapy Room Air              Is Patient Diabetic? Yes  Nutritional Status Obese  Does patient need assistance? Functional Status Self care Ambulation Normal Comments patient walks with a cane.   PCP:  Ellie Lunch  Chief Complaint:  medication refill/ lower back pain / burning-stinging bilateral leg at night /.  History of Present Illness: 65 yo woman with morbid obesity, DM, and arthritis presents to [facility] today for follow-up of her dyspnea on exertion.  Evaluation to date has included 2D echo, pulmonary function test, and sleep study.  Pt reports severe DOE for 2 years.  Also c/o chest pain which occurs monthly and is relieved by nitroglycerin.  She has been evaluated by Dr. Tedra Senegal for this pain.  The pain is not more frequent or severe than before.   Prior Medications: NORVASC 5 MG TABS (AMLODIPINE BESYLATE) Take 1 tablet by mouth once a day ASPIRIN 81 MG TBEC (ASPIRIN) Take 1 tablet by mouth once a day LASIX 80 MG TABS (FUROSEMIDE) Take 11/2  tablets by mouth once a day PAXIL 30 MG TABS (PAROXETINE HCL) Take 2 tablets by mouth once a day LOTENSIN 40 MG TABS (BENAZEPRIL HCL) Take 1 tablet by mouth once a day GLUCOPHAGE 1000 MG TABS (METFORMIN HCL) Take 1 tablet by mouth two times a day GLUCOTROL 10 MG TABS (GLIPIZIDE) Take 1 tablet by mouth two times a day NITROQUICK  SUBL (NITROGLYCERIN SUBL) Use as directed IMDUR 30 MG TB24 (ISOSORBIDE MONONITRATE) Take 1 tablet by mouth once a day PEPCID 20 MG TABS (FAMOTIDINE) Take 1 tablet by mouth  two times a day ACTOS 30 MG TABS (PIOGLITAZONE HCL) Take 1 tablet by mouth once a day NEURONTIN 300 MG CAPS (GABAPENTIN) Take 1 tablet by mouth once a day PRAVACHOL 80 MG TABS (PRAVASTATIN SODIUM) Take 1 tablet by mouth once a day FERROUS SULFATE 325 (65 FE) MG TABS (FERROUS SULFATE) Take 1 tablet by mouth two times a day Current Allergies: No known allergies     Risk Factors:  Tobacco use:  never Alcohol use:  no Exercise:  yes    Times per week:  one day    Type:  walking Seatbelt use:  100 %   Review of Systems  General      Complains of fatigue.      Denies fever, loss of appetite, and weakness.  CV      Complains of chest pain or discomfort, swelling of feet, and swelling of hands.      Denies difficulty breathing at night and difficulty breathing while lying down.  Resp      Denies cough.   Physical Exam  General:     alert and overweight-appearing.   O2 sat with ambulation was 93% Neck:     No  JVD but pt is morbidly obese Lungs:     Clear breath sounds, good air movement Heart:     normal rate, regular rhythm, no murmur, no gallop, and no rub.   Abdomen:     soft, non-tender, normal bowel sounds, no distention, and no masses.   Extremities:     3+ left pedal edema and 2+ right pedal edema.      Impression & Recommendations:  Problem # 1:  DYSPNEA ON EXERTION (ICD-786.09) This has been extensively evaluated to date with 2D echo, sleep study, and PFT's.  For completeness will check chest XR to evaluate for signs of pulmonary fibrosis though this is less likely given her normal exam and normal PFT's (per Dr. Renae Fickle).  Given that she does have lower extremity edema which can be a result of chronic DVT's causing venous insufficiency, will go ahead and check dopplers.  I suspect dyspnea and mild hypoxia  is secondary to obesity hypoventilation syndrome.  Orders: Diagnostic X-Ray/Fluoroscopy (Diagnostic X-Ray/Flu) LE Venous Duplex (DVT) (DVT)   Her updated  medication list for this problem includes:    Lasix 80 Mg Tabs (Furosemide) .[name]... Take 11/2  tablets by mouth once a day    Lotensin 40 Mg Tabs (Benazepril hcl) .[name]... Take 1 tablet by mouth once a day   Problem # 2:  LEG EDEMA (ICD-782.3) Severe.  Of note, Dr. Renae Fickle currently has the patient on 120mg  Lasix daily for this, but the etiology is unclear to me.  She has had a normal 2D echo.  CMET in July was within normal limits.  She has not had a urine microalbumin/creatinine ration checked which she needs as she is diabetic but nephrotic syndrome is unlikely.  I suspect her edema is secondary to lymphedema or CVI.  Will check a doppler ultrasound as above.  If negative, recommend compression stockings and decreased lasix dose.    Her updated medication list for this problem includes:    Lasix 80 Mg Tabs (Furosemide) .[name]... Take 11/2  tablets by mouth once a day   Problem # 3:  DIABETES MELLITUS, TYPE II (ICD-250.00) Pt to return to office to see Dr. Renae Fickle in her next available appointment to address diabetic care.   CBG and HgbA1C reviewed.   Will check urine microalbumin creatinine ratio at next visit as this has not been done in over two years.    Her updated medication list for this problem includes:    Aspirin 81 Mg Tbec (Aspirin) .[name]... Take 1 tablet by mouth once a day    Lotensin 40 Mg Tabs (Benazepril hcl) .[name]... Take 1 tablet by mouth once a day    Glucophage 1000 Mg Tabs (Metformin hcl) .[name]... Take 1 tablet by mouth two times a day    Glucotrol 10 Mg Tabs (Glipizide) .[name]... Take 1 tablet by mouth two times a day    Actos 30 Mg Tabs (Pioglitazone hcl) .[name]... Take 1 tablet by mouth once a day  Orders: T- Capillary Blood Glucose (16109) T-Hgb A1C (in-house) (60454UJ)  Future Orders: T-Urine Microalbumin w/creat. ratio [id number] / [id number]) ... 12/13/2006    Patient Instructions: 1)  Follow up with Dr. Renae Fickle in her next available appointment to evaluate test results. 2)  The patient was  encouraged to lose weight for better health.   Ideal weight loss of 5lbs per month.    Laboratory Results      Date/Time Recieved:  December 07, 2006 10:28 AM  Date/Time Reported:  December 07, 2006  10:28 AM ..................................................................Marland KitchenOren Beckmann  December 07, 2006 10:28 AM   Blood Tests Glucose (random): 121 mg/dL   (Normal Range: 16-109) HGBA1C: 7.0%   (Normal Range: Non-Diabetic - 3-6%   Control Diabetic - 6-8%)   Other Tests

## 2010-11-11 NOTE — Miscellaneous (Signed)
Summary: HIPAA Restrictions  HIPAA Restrictions   Imported By: Florinda Marker 12/27/2008 16:05:53  _____________________________________________________________________  External Attachment:    Type:   Image     Comment:   External Document

## 2010-11-11 NOTE — Letter (Signed)
Summary: [facility]   Imported By: Shon Hough 06/17/2010 10:53:32  _____________________________________________________________________  External Attachment:    Type:   Image     Comment:   External Document

## 2010-11-11 NOTE — Miscellaneous (Signed)
Summary: [facility]   Imported By: Margie Billet 02/04/2010 14:04:06  _____________________________________________________________________  External Attachment:    Type:   Image     Comment:   External Document

## 2010-11-11 NOTE — Letter (Signed)
Summary: [employer]  [employer]   Imported By: Florinda Marker 02/05/2009 11:18:30  _____________________________________________________________________  External Attachment:    Type:   Image     Comment:   External Document

## 2010-11-11 NOTE — Procedures (Signed)
Summary: Colonoscopy   Colonoscopy  Procedure date:  01/14/2009  Findings:      Location:  [facility].    Procedures Next Due Date:    Colonoscopy: 01/2019  COLONOSCOPY PROCEDURE REPORT  PATIENT:  [name] [name], [name] [name]  MR#:  [id number] BIRTHDATE:   05/02/1946, 62 yrs. old   GENDER:   female  ENDOSCOPIST:   Barbette Hair. Arlyce Dice, MD Referred by: Ned Grace, M.D.  PROCEDURE DATE:  01/14/2009 PROCEDURE:  Colonoscopy, diagnostic ASA CLASS:   Class II INDICATIONS: history of pre-cancerous (adenomatous) colon polyps   MEDICATIONS:    Fentanyl 100 mcg IV, Versed 9 mg IV  DESCRIPTION OF PROCEDURE:   After the risks benefits and alternatives of the procedure were thoroughly explained, informed consent was obtained.  Digital rectal exam was performed and revealed no abnormalities.   The LB [id number] endoscope was introduced through the anus and advanced to the cecum, which was identified by the ileocecal valve, limited by poor preparation.  Large amount of iron containing fluid  The quality of the prep was poor, using MiraLax.  The instrument was then slowly withdrawn as the colon was fully examined. <<PROCEDUREIMAGES>>                  <<OLD IMAGES>>  FINDINGS:  Internal hemorrhoids were found (see image10).  This was otherwise a normal examination of the colon (see image1, image2, image3, image4, image5, image6, image7, image8, and image9).   Retroflexed views in the rectum revealed no abnormalities.    The scope was then withdrawn from the patient and the procedure completed.  COMPLICATIONS:   None  ENDOSCOPIC IMPRESSION:  1) Internal hemorrhoids  2) Otherwise normal examination RECOMMENDATIONS:  1) Continue current colorectal screening recommendations for "routine risk" patients with a repeat colonoscopy in 10 years.  REPEAT EXAM:   In 10 year(s) for Colonoscopy.   _______________________________ Barbette Hair. 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 Darin Engels. non-specific chronic endocervicitis 10/04 Observations: Added new observation of COLONRECACT: Repeat colonoscopy in 10 years.   (01/14/2009 9:59) Added new observation of COLONOSCOPY:  Results: Hemorrhoids.      (01/14/2009 9:59)       Colonoscopy  Procedure date:  01/14/2009  Findings:       Results: Hemorrhoids.       Colonoscopy  Procedure date:  01/14/2009  Comments:      Repeat colonoscopy in 10 years.     Colonoscopy  Procedure date:  01/14/2009  Findings:       Results: Hemorrhoids.       Colonoscopy  Procedure date:  01/14/2009  Comments:      Repeat colonoscopy in 10 years.

## 2010-11-11 NOTE — Assessment & Plan Note (Signed)
Summary: 6W F/U/EST/VS   Vital Signs:  Patient profile:   65 year old female Height:      64 inches (162.56 cm) Weight:      222.9 pounds (101.32 kg) BMI:     38.40 Temp:     97.0 degrees F (36.11 degrees C) oral Pulse rate:   69 / minute BP sitting:   136 / 59  (right arm)  Vitals Entered By: Chinita Pester RN (February 06, 2010 8:49 AM) CC: F/U visit. Pain in both hands.  Left hand  slightly swollen. Is Patient Diabetic? Yes Did you bring your meter with you today? Yes Pain Assessment Patient in pain? yes     Location: hands Intensity: 7 Type: aching Onset of pain  Intermittent; esp with usage. Nutritional Status BMI of > 30 = obese CBG Result 229  Have you ever been in a relationship where you felt threatened, hurt or afraid?No   Does patient need assistance? Functional Status Self care Ambulation Normal   Primary Care Provider:  Joaquin Courts  MD  CC:  F/U visit. Pain in both hands.  Left hand  slightly swollen.[name]  History of Present Illness: Pt is a 65 yo female w/ past med hx below here for f/u of labs.  She has been going to PT for assistance w/ her gait and has not had any falls since I last saw her.  She continues to have neck pain that is relieved by her pain meds.  Since starting PT, she has started to have pain in her R middle finger.  She also has pain/swelling in her L wrist and thumb that has been ongoing for a while.  Pain has been worse over the last two weeks.  She denies fevers/chills, no known injuries.  She is R handed and uses a cane for ambulation.  She has morning stiffness for about 30 minutes.  Pain in her L wrist occaisonally wakes her up at night and is a tingling/numbness pain sometimes and other times it is an ache.  She doesn't have problems in her other joints.    She has been checking her sugars and they are around 120-180.  No lows.   Depression History:      The patient denies a depressed mood most of the day and a diminished interest in her  usual daily activities.         Preventive Screening-Counseling & Management  Alcohol-Tobacco     Alcohol drinks/day: 0     Smoking Status: never  Caffeine-Diet-Exercise     Does Patient Exercise: yes     Type of exercise: PT for balance  Current Medications (verified): 1)  Aspirin 81 Mg Tbec (Aspirin) .... Take 1 Tablet By Mouth Once A Day 2)  Lasix 80 Mg Tabs (Furosemide) .... Take 1/2  Tablet By Mouth Once A Day 3)  Paxil 30 Mg Tabs (Paroxetine Hcl) .... Take 1 Tablet By Mouth Once A Day. 4)  Lotensin 40 Mg Tabs (Benazepril Hcl) .... Take 1 Tablet By Mouth Once A Day 5)  Glucophage 1000 Mg Tabs (Metformin Hcl) .... Take 1 Tablet By Mouth Two Times A Day 6)  Glucotrol 10 Mg Tabs (Glipizide) .... Take 2 Tablets By Mouth Two Times A Day 7)  Nitroquick  Subl (Nitroglycerin Subl) .... Use As Directed 8)  Imdur 30 Mg Tb24 (Isosorbide Mononitrate) .... Take 1 Tablet By Mouth Once A Day 9)  Pepcid 20 Mg Tabs (Famotidine) .... Take 1 Tablet By Mouth Two  Times A Day 10)  Pravachol 80 Mg Tabs (Pravastatin Sodium) .... Take 1 Tablet By Mouth Once A Day 11)  Ferrous Sulfate 325 (65 Fe) Mg Tabs (Ferrous Sulfate) .... Take 1 Tablet By Mouth Two Times A Day 12)  Atenolol 100 Mg Tabs (Atenolol) .... Take 1/2 Pill Daily By Mouth. 13)  Tylenol 325 Mg Tabs (Acetaminophen) .... Two Tabs Every Eight Hours As Needed Forpain. 14)  Calcium Carbonate 600 Mg Tabs (Calcium Carbonate) .... Take 1 Tablet By Mouth Two Times A Day 15)  Tylenol With Codeine #3 300-30 Mg Tabs (Acetaminophen-Codeine) .... Take 1 Tablet By Mouth Two Times A Day As Needed For Pain 16)  Cobal-1000 1000 Mcg/ml Soln (Cyanocobalamin) .... Inject 1 Ml Daily For One Week, Then Weekly For One Month, Then Once A Month. 17)  Vitamin D3 1000 Unit Tabs (Cholecalciferol) .... Take 1 Tablet By Mouth Once A Day 18)  Mag-Delay 535 (64 Mg) Mg Cr-Tabs (Magnesium Chloride) .... Take 1 Tablet By Mouth Two Times A Day  Allergies (verified): No Known  Drug Allergies  Past History:  Past Surgical History: Last updated: 08/02/2009 Posterior lumbar interfusion surgery at L2-L3 (Dr. Marikay Alar), 07/18/07 Lumbar fusion surgery at L3-L5 (s/p hardware removal in 10/08 on re-exploration), c. 2006 Rotator cuff repair, date unknown  History of shoulder surgery. Cholecystectomy.  endometrial biopsy.  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 Single.   Never used tobacco. No alcohol or illicit drug use. Does exercise regularly. Walks 3 times/wk. Goes to exercise at [facility]  Past Medical History: Current Problems:  CAD (ICD-414.00) HYPERTENSION (ICD-401.9) DYSLIPIDEMIA (ICD-272.4) DYSPNEA ON EXERTION (ICD-786.09) LEG EDEMA (ICD-782.3) NECK PAIN (ICD-723.1) OBESITY (ICD-278.00) GERD (ICD-530.81) DIABETES MELLITUS, TYPE II (ICD-250.00) SPINAL STENOSIS, LUMBAR (ICD-724.02) DEPRESSION (ICD-311) GLAUCOMA NOS (ICD-365.9) HEMORRHOIDS (ICD-455.6) HYPOMAGNESEMIA (ICD-275.2) DIVERTICULOSIS OF COLON (ICD-562.10) OSTEOPENIA (ICD-733.90) HEALTH SCREENING (ICD-V70.0) ARM PAIN, RIGHT (ICD-729.5) OTHER SCREENING MAMMOGRAM (ICD-V76.12) HYPOKALEMIA (ICD-276.8) ANEMIA, IRON DEFICIENCY (ICD-280.9) DEGENERATIVE JOINT DISEASE (ICD-715.90) B12 def- B12 level 213 on 03/11  Family History: Reviewed history from 08/02/2009 and no changes required. Family = 3 brothers, 9 sisters (1 deceased).   Pt's father and sister developed type II DM.   No MIs or strokes in pt's family of which pt is aware. No h/o cancer in family. Pt's 2 sisters have HTN and HL.  Social History: Reviewed history from 08/02/2009 and no changes required. 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 Single.   Never used tobacco. No alcohol or illicit drug use. Does exercise regularly. Walks 3 times/wk. Goes to exercise at [facility]  Review of Systems       as per  hpi.  Physical Exam  General:  alert, oriented, well groomed, cane at her side, no distress.  Eyes:  anicteric.  Neck:  no LAD, no carotid bruits. Lungs:  Normal respiratory effort, chest expands symmetrically. Lungs are clear to auscultation, no crackles or wheezes. Heart:  Normal rate and regular rhythm, no m/r/g. Abdomen:  obese, soft, NT and ND.  Msk:  no synovitis noted on exam of the right hand in any of the joints.  pain located in the MCP joint on the 3rd finger.  FROM and full strength, no sensation deficits.    L wrist mildly edematous on the radial aspect, edema extends along the thenar area to the MCP joint, mildly TTP, mild pain w/ movement, strength intact, Tinel and Phalen tests  positive, normal radial pusle. Pulses:  Radial pulses normal bilaterally.  Extremities:  no lower extremity edema.  Neurologic:  gait assisted w/ a cane.  Cervical Nodes:  No lymphadenopathy noted Axillary Nodes:  No palpable lymphadenopathy Psych:  mood euthymic.    Impression & Recommendations:  Problem # 1:  WRIST PAIN (ICD-719.43) DDx includes OA, frx given hx of trauma a few weeks ago w/ falling, deQuervain's tenosynovitis, carpel tunnel, or rheum causes given she has arthritis symptoms on her other hand.  It appears on exam that she does have edema that may account for the carpel tunnel, but will get xray and below labs to f/u for causes of edema.  Placed in splint, particularly at night.  Also, wrote for voltaren gel to help w/ inflammation.   I am a little hesitant to write for by mouth NSAID's given that she is already on ACE I, but she may need this in the future.  If xray c/w OA, will write for OT per PT recommendation.   Orders: T-Sed Rate (Automated) [phone]) T-C-Reactive Protein [id number]) T-Rheumatoid Factor [id number]) Diagnostic X-Ray/Fluoroscopy (Diagnostic X-Ray/Flu)  Problem # 2:  VITAMIN D DEFICIENCY (ICD-268.9) Vit D level c/w insufficiency, will start 1000  international units's per day and f/u in about 6-8 weeks.    Problem # 3:  ANEMIA, NORMOCYTIC (ICD-285.9) Hx of iron def w/ colonoscopy c/w hemorrhoids.  B12 low so this may be the culprit but will f/u studies below to make sure it is not multifactorial.  Her updated medication list for this problem includes:    Ferrous Sulfate 325 (65 Fe) Mg Tabs (Ferrous sulfate) .[name]... Take 1 tablet by mouth two times a day    Cobal-1000 1000 Mcg/ml Soln (Cyanocobalamin) ..... Inject 1 ml daily for one week, then weekly for one month, then once a month.  Orders: T-Iron [phone]) T-Iron Binding Capacity (TIBC) ([address]) T-Ferritin [phone]) T-Folic Acid; RBC (95638-75643)  Problem # 4:  VITAMIN B12 DEFICIENCY (ICD-266.2) Likely related to her current medications.  Pt taught to give injections herself and did really well in the office.  Since she is willing to do injections and is on medication known to cause malabsorption, will hold on antibody workup and cont w/ injections alone.  Orders: Admin of Therapeutic Inj  intramuscular or subcutaneous (32951) Vit B12 1000 mcg (J3420)  Problem # 5:  ACCIDENTAL FALL (ICD-E888.9) Vit D on the lower side, B12 low, and also has spinal stenosis.  Doing well w/ PT.   Problem # 6:  HYPERTENSION (ICD-401.9) BP essentially at goal.  Cont current meds.  Her updated medication list for this problem includes:    Lasix 80 Mg Tabs (Furosemide) .[name]... Take 1/2  tablet by mouth once a day    Lotensin 40 Mg Tabs (Benazepril hcl) .[name]... Take 1 tablet by mouth once a day    Atenolol 100 Mg Tabs (Atenolol) .[name]... Take 1/2 pill daily by mouth.  Problem # 7:  DIABETES MELLITUS, TYPE II (ICD-250.00) A1c slightly above goal, blood sugars reviewed today and doing better.  F/u a1c next month.  Her updated medication list for this problem includes:    Aspirin 81 Mg Tbec (Aspirin) .[name]... Take 1 tablet by mouth once a day    Lotensin 40 Mg Tabs (Benazepril hcl) .[name]... Take 1  tablet by mouth once a day    Glucophage 1000 Mg Tabs (Metformin hcl) .[name]... Take 1 tablet by mouth two times a day    Glucotrol 10 Mg Tabs (Glipizide) .Marland KitchenMarland KitchenMarland KitchenMarland Kitchen  Take 2 tablets by mouth two times a day  Orders: Capillary Blood Glucose/CBG (04540)  Problem # 8:  HYPOMAGNESEMIA (ICD-275.2) Started supplementation and f/u in 4-6 weeks.  Problem # 9:  OSTEOPENIA (ICD-733.90) Cont Ca and Vit D.  Complete Medication List: 1)  Aspirin 81 Mg Tbec (Aspirin) .... Take 1 tablet by mouth once a day 2)  Lasix 80 Mg Tabs (Furosemide) .... Take 1/2  tablet by mouth once a day 3)  Paxil 30 Mg Tabs (Paroxetine hcl) .... Take 1 tablet by mouth once a day. 4)  Lotensin 40 Mg Tabs (Benazepril hcl) .... Take 1 tablet by mouth once a day 5)  Glucophage 1000 Mg Tabs (Metformin hcl) .... Take 1 tablet by mouth two times a day 6)  Glucotrol 10 Mg Tabs (Glipizide) .... Take 2 tablets by mouth two times a day 7)  Nitroquick Subl (Nitroglycerin subl) .... Use as directed 8)  Imdur 30 Mg Tb24 (Isosorbide mononitrate) .... Take 1 tablet by mouth once a day 9)  Pepcid 20 Mg Tabs (Famotidine) .... Take 1 tablet by mouth two times a day 10)  Pravachol 80 Mg Tabs (Pravastatin sodium) .... Take 1 tablet by mouth once a day 11)  Ferrous Sulfate 325 (65 Fe) Mg Tabs (Ferrous sulfate) .... Take 1 tablet by mouth two times a day 12)  Atenolol 100 Mg Tabs (Atenolol) .... Take 1/2 pill daily by mouth. 13)  Tylenol 325 Mg Tabs (Acetaminophen) .... Two tabs every eight hours as needed forpain. 14)  Calcium Carbonate 600 Mg Tabs (Calcium carbonate) .... Take 1 tablet by mouth two times a day 15)  Tylenol With Codeine #3 300-30 Mg Tabs (Acetaminophen-codeine) .... Take 1 tablet by mouth two times a day as needed for pain 16)  Cobal-1000 1000 Mcg/ml Soln (Cyanocobalamin) .... Inject 1 ml daily for one week, then weekly for one month, then once a month. 17)  Vitamin D3 1000 Unit Tabs (Cholecalciferol) .... Take 1 tablet by mouth once a  day 18)  Mag-delay 535 (64 Mg) Mg Cr-tabs (Magnesium chloride) .... Take 1 tablet by mouth two times a day 19)  Voltaren 1 % Gel (Diclofenac sodium) .... Apply small amount to affected area up to 4 times a day. 20)  Syringe 23g X 3/4" 3 Ml Misc (Syringe/needle (disp)) .... Use to injection b12.  Other Orders: Pneumococcal Vaccine (98119) Admin 1st Vaccine (14782) Admin 1st Vaccine [facility]) [phone])  Patient Instructions: 1)  Please make a followup appointment in 1 month. 2)  You will be called with any abnormal labwork.  Please make sure your phone number is correct at the front desk. 3)  Please take your medicines as we discussed. 4)  Call sooner if you need anything.  Prescriptions: SYRINGE 23G X 3/4" 3 ML MISC (SYRINGE/NEEDLE (DISP)) Use to injection B12.  #30 x 0   Entered and Authorized by:   Joaquin Courts  MD   Signed by:   Joaquin Courts  MD on 02/06/2010   Method used:   Electronically to        [facility] (retail)       [address]       [place], [place]  [id number]       Ph: [phone]       Fax: [phone]   RxID:   [id number] VOLTAREN 1 % GEL (DICLOFENAC SODIUM) Apply small amount to affected area up to 4 times a day.  #1 tube x 0  Entered and Authorized by:   Joaquin Courts  MD   Signed by:   Joaquin Courts  MD on 02/06/2010   Method used:   Electronically to        [facility] [address] (retail)       [address]       [place], [place]  [id number]       Ph: [phone]       Fax: [phone]   RxID:   [id number]   Prevention & Chronic Care Immunizations   Influenza vaccine: Fluvax 3+  (07/25/2009)   Influenza vaccine deferral: Deferred  (12/24/2009)    Tetanus booster: Not documented    Pneumococcal vaccine: Pneumovax  (02/06/2010)    H. zoster vaccine: Not documented  Colorectal Screening   Hemoccult: Not documented    Colonoscopy:  Results: Hemorrhoids.       (01/14/2009)   Colonoscopy  action/deferral: Repeat colonoscopy in 10 years.    (01/14/2009)   Colonoscopy due: 01/2019  Other Screening   Pap smear: Not documented   Pap smear action/deferral: Deferred  (12/24/2009)    Mammogram: ASSESSMENT: Negative - BI-RADS 1^MM DIGITAL SCREENING  (06/05/2009)   Mammogram action/deferral: Screening mammogram in 1 year.     (06/04/2008)   Mammogram due: 06/2009    DXA bone density scan: L femoral neck  t score -1.5 c/w osteopenia   (06/04/2008)   DXA scan due: 06/2010    Smoking status: never  (02/06/2010)  Diabetes Mellitus   HgbA1C: 7.4  (11/21/2009)    Eye exam: No diabetic retinopathy.     (01/08/2010)   Eye exam due: 01/2011    Foot exam: yes  (10/10/2009)   High risk foot: Not documented   Foot care education: Not documented    Urine microalbumin/creatinine ratio: 5.9  (07/25/2009)   Urine microalbumin action/deferral: Ordered    Diabetes flowsheet reviewed?: Yes   Progress toward A1C goal: At goal  Lipids   Total Cholesterol: 140  (10/15/2009)   LDL: 78  (10/15/2009)   LDL Direct: Not documented   HDL: 51  (10/15/2009)   Triglycerides: 55  (10/15/2009)    SGOT (AST): 12  (10/15/2009)   SGPT (ALT): 8  (10/15/2009)   Alkaline phosphatase: 76  (10/15/2009)   Total bilirubin: 0.5  (10/15/2009)    Lipid flowsheet reviewed?: Yes   Progress toward LDL goal: At goal  Hypertension   Last Blood Pressure: 136 / 59  (02/06/2010)   Serum creatinine: 0.68  (10/15/2009)   Serum potassium 4.4  (10/15/2009)    Hypertension flowsheet reviewed?: Yes   Progress toward BP goal: At goal  Self-Management Support :   Personal Goals (by the next clinic visit) :     Personal A1C goal: 7  (07/25/2009)     Personal blood pressure goal: 130/80  (07/25/2009)     Personal LDL goal: 100  (07/25/2009)    Patient will work on the following items until the next clinic visit to reach self-care goals:     Medications and monitoring: take my medicines every day, check my  blood sugar, bring all of my medications to every visit, examine my feet every day  (02/06/2010)     Eating: drink diet soda or water instead of juice or soda, eat more vegetables, use fresh or frozen vegetables, eat foods that are low in salt, eat baked foods instead of fried foods, eat fruit for snacks and desserts  (02/06/2010)     Activity: take a  30 minute walk every day, park at the far end of the parking lot  (12/24/2009)    Diabetes self-management support: Written self-care plan, Resources for patients handout  (02/06/2010)   Diabetes care plan printed   Last diabetes self-management training by diabetes educator: 11/21/2009    Hypertension self-management support: Written self-care plan, Resources for patients handout  (02/06/2010)   Hypertension self-care plan printed.    Lipid self-management support: Written self-care plan, Resources for patients handout  (02/06/2010)   Lipid self-care plan printed.      Resource handout printed.   Nursing Instructions: Give Pneumovax today   Process Orders Check Orders Results:     [facility]: ABN not required for this insurance Tests Sent for requisitioning (February 06, 2010 11:38 AM):     02/06/2010: [facility] -- T-Iron [[id number]] (signed)     02/06/2010: [facility] -- T-Iron Binding Capacity (TIBC) [[id number]] (signed)     02/06/2010: [facility] -- T-Ferritin [[id number]] (signed)     02/06/2010: [facility] -- T-Folic Acid; RBC [[id number]] (signed)     02/06/2010: [facility] -- T-Sed Rate (Automated) [[id number]] (signed)     02/06/2010: [facility] -- T-C-Reactive Protein [phone] (signed)     02/06/2010: [facility] -- T-Rheumatoid Factor [phone] (signed)     Pneumovax Vaccine    Vaccine Type: Pneumovax    Site: right deltoid    Mfr: Merck    Dose: 0.5 ml    Route: IM     Given by: Chinita Pester RN    Exp. Date: 09/20/2010    Lot #: [id number]    VIS given: 05/09/96 version given February 06, 2010.    Medication Administration  Injection # 1:    Medication: Vit B12 1000 mcg    Diagnosis: VITAMIN B12 DEFICIENCY (ICD-266.2)    Route: IM    Site: L thigh    Exp Date: 09/2011    Lot #: [id number]    Mfr: [employer]    Comments: Pt. was instructed on how to give herself B12 injections.Good technique.    Patient tolerated injection without complications    Given by: Chinita Pester RN (February 06, 2010 10:11 AM)  Orders Added: 1)  Capillary Blood Glucose/CBG [82948] 2)  Augusto Gamble [[id number]] 3)  T-Iron Binding Capacity (TIBC) [[address]] 4)  T-Ferritin [16010-93235] 5)  T-Folic Acid; RBC [57322-02542] 6)  T-Sed Rate (Automated) [70623-76283] 7)  T-C-Reactive Protein [15176-16073] 8)  T-Rheumatoid Factor [71062-69485] 9)  Diagnostic X-Ray/Fluoroscopy [Diagnostic X-Ray/Flu] 10)  Est. Patient Level IV [46270] 11)  Pneumococcal Vaccine [90732] 12)  Admin 1st Vaccine [90471] 13)  Admin 1st Vaccine [facility]) [35009F] 14)  Admin of Therapeutic Inj  intramuscular or subcutaneous [96372] 15)  Vit B12 1000 mcg [J3420] 16)  Est. Patient Level IV [81829]

## 2010-11-11 NOTE — Progress Notes (Signed)
  Phone Note Other Incoming   Call placed by: Marin Roberts RN,  March 17, 2007 12:04 PM Summary of Call: call [facility], states pt's weight, hr- resting and nonresting, resp status fluctuating greatly toward increasing x 1 week.appt at 2pm Initial call taken by: Marin Roberts RN,  March 17, 2007 12:07 PM

## 2010-11-11 NOTE — Assessment & Plan Note (Signed)
Summary: FU XRAY/VS   Vital Signs:  Patient Profile:   65 Years Old Female Weight:      245.4 pounds O2 Sat:      99 % Temp:     97.8 degrees F oral Pulse rate:   92 / minute BP sitting:   136 / 56  (right arm)  Pt. in pain?   no  Vitals Entered By: Filomena Jungling (December 13, 2006 9:35 AM)              Is Patient Diabetic? Yes  Nutritional Status Normal  Have you ever been in a relationship where you felt threatened, hurt or afraid?No   Does patient need assistance? Functional Status Self care Ambulation Normal   PCP:  Renae Fickle, bhakti  Chief Complaint:  FOLLOW-UP VISIT.  History of Present Illness: Pt is here to f/u on SOB. She was seen by Dr. Lyda Perone on 12/09/06 and ordered a Venous Dopplers to r/o DVT and also a CXR to r/o Pulmonary fibrosis.(Please see previous notes for details) Pt still continues to feel SOB, persistent leg edema and poor exercise tolerance.   Prior Medications (reviewed today): ASPIRIN 81 MG TBEC (ASPIRIN) Take 1 tablet by mouth once a day LASIX 80 MG TABS (FUROSEMIDE) Take 11/2  tablets by mouth once a day PAXIL 30 MG TABS (PAROXETINE HCL) Take 2 tablets by mouth once a day LOTENSIN 40 MG TABS (BENAZEPRIL HCL) Take 1 tablet by mouth once a day GLUCOPHAGE 1000 MG TABS (METFORMIN HCL) Take 1 tablet by mouth two times a day GLUCOTROL 10 MG TABS (GLIPIZIDE) Take 1 tablet by mouth two times a day NITROQUICK  SUBL (NITROGLYCERIN SUBL) Use as directed IMDUR 30 MG TB24 (ISOSORBIDE MONONITRATE) Take 1 tablet by mouth once a day PEPCID 20 MG TABS (FAMOTIDINE) Take 1 tablet by mouth two times a day ACTOS 30 MG TABS (PIOGLITAZONE HCL) Take 1 tablet by mouth once a day NEURONTIN 300 MG CAPS (GABAPENTIN) Take 1 tablet by mouth once a day PRAVACHOL 80 MG TABS (PRAVASTATIN SODIUM) Take 1 tablet by mouth once a day FERROUS SULFATE 325 (65 FE) MG TABS (FERROUS SULFATE) Take 1 tablet by mouth two times a day ATENOLOL 100 MG TABS (ATENOLOL) Take 1/2 pill daily by mouth.  Current Allergies (reviewed today): No known allergies       Physical Exam  General:     alert and overweight-appearing.   Head:     normocephalic.   Eyes:     vision grossly intact, pupils equal, pupils round, and pupils reactive to light.   Neck:     supple and no thyroid nodules or tenderness.   Lungs:     normal respiratory effort, no intercostal retractions, no accessory muscle use, normal breath sounds, no dullness, no crackles, and no wheezes.   Distant Breath sounds. Heart:     normal rate, regular rhythm, no murmur, no gallop, no rub, and no JVD.   Abdomen:     soft, non-tender, and normal bowel sounds.   Extremities:     +2 edema bil    Impression & Recommendations:  Problem # 1:  DYSPNEA ON EXERTION (ICD-786.09) Venous Dopplers and CXR were neg for DVT and Pulmonary fibrosis resp.  The pt has been anemic in the past and is on Iron supplementation. So I will go ahead see if that has helped. Also her TSH has been normal in the past so r/o that as the cause. By her PFTs, she has mild RLD  due to obesity.  So, my plan is to check CBC, intervene if required more aggressivley, change Norvasc to Atenolol 50mg  daily (as pt does not have obstructive dz, it will not worsen the situation) as Norvasc can attribute to leg edema. Also I shall refer her for Cardio-pulmonary rehab to improve her exercise tolerance.  This should be tried for 3-6 mths, and then if not helping can consider a Pulmonary consult. Her updated medication list for this problem includes:    Lasix 80 Mg Tabs (Furosemide) .[name]... Take 11/2  tablets by mouth once a day    Lotensin 40 Mg Tabs (Benazepril hcl) .[name]... Take 1 tablet by mouth once a day    Atenolol 100 Mg Tabs (Atenolol) .[name]... Take 1/2 pill daily by mouth.  Orders: Rehabilitation Referral (Rehab)   Problem # 2:  LEG EDEMA (ICD-782.3) As above. Her updated medication list for this problem includes:    Lasix 80 Mg Tabs (Furosemide) .[name]... Take  11/2  tablets by mouth once a day   Medications Added to Medication List This Visit: 1)  Atenolol 100 Mg Tabs (Atenolol) .... Take 1/2 pill daily by mouth.  Other Orders: T-CBC No Diff ([id number])   Patient Instructions: 1)  Limit intake of Sodium (Salt). 2)  Please schedule a follow-up appointment in 1 month. 3)  The patient was encouraged to lose weight for better health.    4)  Recommend referral and attendance at [facility]. 5)  Please note that Norvasc has been stopped and Atenolol has been started.

## 2010-11-11 NOTE — Progress Notes (Signed)
  Phone Note Outgoing Call   Call placed by: Joaquin Courts  MD,  August 22, 2008 11:17 AM Summary of Call: Called pt to let her know about the blood in her stool.  No gross bleeding.  Also told her we will try and get her in for a repeat colonoscopy in the next few months since this is around the time she was due for her repeat and she is agreeable.   Initial call taken by: Joaquin Courts  MD,  August 22, 2008 11:18 AM  New Problems: DIVERTICULOSIS OF COLON (ICD-562.10)   New Problems: DIVERTICULOSIS OF COLON (ICD-562.10)

## 2010-11-11 NOTE — Letter (Signed)
Summary: [facility]  [facility]/[place]   Imported By: Margie Billet 03/04/2010 13:36:33  _____________________________________________________________________  External Attachment:    Type:   Image     Comment:   External Document

## 2010-11-11 NOTE — Letter (Signed)
Summary: Pharmacologist   Imported By: Florinda Marker 07/30/2009 15:39:26  _____________________________________________________________________  External Attachment:    Type:   Image     Comment:   External Document

## 2010-11-11 NOTE — Letter (Signed)
Summary: [facility]  [facility]   Imported By: Shon Hough 09/09/2010 11:31:24  _____________________________________________________________________  External Attachment:    Type:   Image     Comment:   External Document

## 2010-11-11 NOTE — Letter (Signed)
Summary: [facility]: [place]  [facility]: FL[facility]   Imported By: Florinda Marker 08/27/2009 15:25:07  _____________________________________________________________________  External Attachment:    Type:   Image     Comment:   External Document

## 2010-11-11 NOTE — Progress Notes (Signed)
Summary: refill/gg  Phone Note Refill Request  on June 14, 2007 4:25 PM  Refills Requested: Medication #1:  GLUCOTROL 10 MG TABS Take 1 tablet by mouth two times a day   Last Refilled: 05/17/2007  Medication #2:  PRAVACHOL 80 MG TABS Take 1 tablet by mouth once a day  Method Requested: electronic Initial call taken by: Merrie Roof RN,  June 14, 2007 4:26 PM  Follow-up for Phone Call        Refilled electronically.  Follow-up by: Margarito Liner MD,  June 14, 2007 5:19 PM      Prescriptions: PRAVACHOL 80 MG TABS (PRAVASTATIN SODIUM) Take 1 tablet by mouth once a day  #30 x 2   Entered and Authorized by:   Margarito Liner MD   Signed by:   Margarito Liner MD on 06/14/2007   Method used:   Electronically sent to ...       [facility] [phone] [address]       [address]       [place], [place]  [address]       Ph: [phone] or [phone]       Fax: [phone]   RxID:   [id number] GLUCOTROL 10 MG TABS (GLIPIZIDE) Take 1 tablet by mouth two times a day  #60 x 2   Entered and Authorized by:   Margarito Liner MD   Signed by:   Margarito Liner MD on 06/14/2007   Method used:   Electronically sent to ...       [facility] [phone] [address]       [address]       [place], [place]  [address]       Ph: [phone] or [phone]       Fax: [phone]   RxID:   [id number]

## 2010-11-11 NOTE — Progress Notes (Signed)
  Phone Note Outgoing Call   Call placed by: Theotis Barrio NT II,  August 10, 2008 11:49 AM Call placed to: Patient Details for Reason: CT APPT Summary of Call: CALLED AND SPOKE WITH MS ROSA, APPT CHANGED DUE TO NEEDING PRIOR AUTHORIZATION. APPT: NOV. 5, 09 @ 9:00 AM TO ARRIVE AT 8: 45AM. [name] NTII

## 2010-11-11 NOTE — Progress Notes (Signed)
Summary: med refill/wl  Phone Note Refill Request Message from:  Fax from Pharmacy on June 07, 2007 10:33 AM  Refills Requested: Medication #1:  GLUCOPHAGE 1000 MG TABS Take 1 tablet by mouth two times a day   Last Refilled: 04/30/2007   Notes: CMET 05/03/07:  BUN 25,  microalb/cr ratio wnl.  Method Requested:  electronic Initial call taken by: Dorene Sorrow RN,  June 07, 2007 10:33 AM  Follow-up for Phone Call        Rx written electronically. Follow-up by: Acey Lav MD,  June 07, 2007 11:04 AM      Prescriptions: GLUCOPHAGE 1000 MG TABS (METFORMIN HCL) Take 1 tablet by mouth two times a day  #62 x 8   Entered and Authorized by:   Acey Lav MD   Signed by:   Paulette Blanch Dam MD on 06/07/2007   Method used:   Electronically sent to ...       [facility] [phone] [address] [facility]       [address]       [place], [place]  [address]       Ph: [phone] or [phone]       Fax: [phone]   RxID:   [id number]

## 2010-11-11 NOTE — Assessment & Plan Note (Signed)
Summary: DM TRAINING/VS   Vital Signs:  Patient profile:   65 year old female Weight:      226.2 pounds BMI:     38.97 Is Patient Diabetic? Yes Did you bring your meter with you today? Yes   Allergies: No Known Drug Allergies   Complete Medication List: 1)  Aspirin 81 Mg Tbec (Aspirin) .... Take 1 tablet by mouth once a day 2)  Lasix 80 Mg Tabs (Furosemide) .... Take 1/2  tablet by mouth once a day 3)  Paxil 30 Mg Tabs (Paroxetine hcl) .... Take 1 tablet by mouth once a day. 4)  Lotensin 40 Mg Tabs (Benazepril hcl) .... Take 1 tablet by mouth once a day 5)  Glucophage 1000 Mg Tabs (Metformin hcl) .... Take 1 tablet by mouth two times a day 6)  Glucotrol 10 Mg Tabs (Glipizide) .... Take 2 tablets by mouth two times a day 7)  Nitroquick Subl (Nitroglycerin subl) .... Use as directed 8)  Imdur 30 Mg Tb24 (Isosorbide mononitrate) .... Take 1 tablet by mouth once a day 9)  Pepcid 20 Mg Tabs (Famotidine) .... Take 1 tablet by mouth two times a day 10)  Pravachol 80 Mg Tabs (Pravastatin sodium) .... Take 1 tablet by mouth once a day 11)  Ferrous Sulfate 325 (65 Fe) Mg Tabs (Ferrous sulfate) .... Take 1 tablet by mouth two times a day 12)  Atenolol 100 Mg Tabs (Atenolol) .... Take 1/2 pill daily by mouth. 13)  Tylenol 325 Mg Tabs (Acetaminophen) .... Two tabs every eight hours as needed forpain. 14)  Calcium Carbonate 600 Mg Tabs (Calcium carbonate) .... Take 1 tablet by mouth two times a day 15)  Tylenol With Codeine #3 300-30 Mg Tabs (Acetaminophen-codeine) .... Take 1 tablet by mouth two times a day as needed for pain 16)  Cobal-1000 1000 Mcg/ml Soln (Cyanocobalamin) .... Inject 1 ml daily for one week, then weekly for one month, then once a month. 17)  Vitamin D3 1000 Unit Tabs (Cholecalciferol) .... Take 1 tablet by mouth once a day 18)  Mag-delay 535 (64 Mg) Mg Cr-tabs (Magnesium chloride) .... Take 1 tablet by mouth two times a day 19)  Voltaren 1 % Gel (Diclofenac sodium) ....  Apply small amount to affected area up to 4 times a day. 20)  Syringe 23g X 3/4" 3 Ml Misc (Syringe/needle (disp)) .... Use to injection b12.  Other Orders: DSMT (Medicaid) 60 Minutes [phone])  Diabetes Self Management Training  PCP: Joaquin Courts  MD Date diagnosed with diabetes: 10/12/1982 Diabetes Type: Type 2 non-insulin treated Other persons present: NO Current smoking Status: never  Vital Signs Todays Weight: 226.2lb  in BMI 38.97in-lbs   Assessment Daily activities: less active duirng winter than she had been, says she plans to begin getting on her stationary bicycle more Special needs or Barriers: HAS NOT BEEN SLEEPING- GOES TO BED AT 9:30- HAS A BEDTIME ROUTINE- NO tv, DOES DRINK CAFFEINATED DRINKS LATE IN DAY- LAYS IN BED, TOSSING AND TURNING UNTIL 5-6 AM WHEN SHE FALLS ASLEEP.   Diabetes Medications:  Lipid lowering Meds? Yes Anti-platelet Meds? Yes Herbs or Supplements: No Comments: Okey Dupre is not happy wiht her blood sugars- see download- range is 72- 238 with many in the high 100s na low 200s for the past month, fastings are mostly in low 200s, DAYTIME BLOOD SUGARS ARE mostly in 100s. she reports following meal plan, taking all medications and walking twice daily over the past two months. She is wiling to  take insulin injection if needed.     Monitoring Self monitoring blood glucose 2 -3 times a day Name of Meter  Prodigy Measures urine ketones? No  Recent Episodes of: Requiring Help from another person  Hyperglycemia : Yes Hypoglycemia: No Severe Hypoglycemia : No   Wears Medical I.D. No   Estimated /Usual Carb Intake Breakfast # of Carbs/Grams 2 slices toast, 1 egg, 1 cup coffee Lunch # of Carbs/Grams 1/2 cup skim milk, pimneto cheese sandwich, small apple Midafternoon # of Carbs/Grams diet mt dew Dinner # of Carbs/Grams baked chicken, greens beans or cabbage, mashed potatoes or rice, cornbread, iced tea Bedtime # of Carbs/Grams does not snacks  much per her report  Nutrition assessment Weight change: Gain Amount of change: 1-2 # in 2 months What beverages do you drink?  coffee, tea, milk, water, diet soda Do you read food labels?                                                                          No Biggest challenge to eating healthy: Eating too much  Activity Limitations  Appropriate physical activity Would you  say you are physically active: Yes Diabetes Disease Process  Discussed today  Medications  Nutritional Management State changes planned for home meals/snacks: Needs review/assistance    Monitoring  Complications Explain proper treatment of hyperglycemia: Demonstrates competency    Exercise States importance of exercise: Demonstrates competency   Diabetes Management Education Done: 02/19/2010    BEHAVIORAL GOAL FOLLOW UP Incorporating physical activity into lifestyle: Always Utilizing medications if for therapeutic effectiveness: Always Incorporating appropriate nutritional management: Most of the time      [name] [name] feels lack of sleep causing hihger blood sugars in morning. She feels that she is doing all she can to care for her diabetes: her diet, activity ar ethe best she can do. She kept record of activity- but question accuarcay as she put an x in every box rather than the specific acitity she did that day. her inability to read may be the problem here.  A1C due at next visit with PCP. reviewed meter readings x 2 months- they are up and down,  but higher fasting seems to be the trend. Patient will probably need insulin at some point. She is willing to start insulin if needed.  Has gained  #  over the past 3 months. Encouragement given to contniue weight loss efforts. Diabetes Self Management Support: clinic staff, family- grandson  Follow-up: 3 months in office

## 2010-11-20 ENCOUNTER — Other Ambulatory Visit: Payer: Self-pay | Admitting: *Deleted

## 2010-11-20 MED ORDER — ISOSORBIDE MONONITRATE ER 30 MG PO TB24: 30.0000 mg | ORAL_TABLET | Freq: Every day | ORAL | Status: DC

## 2010-12-01 ENCOUNTER — Other Ambulatory Visit: Payer: Self-pay | Admitting: *Deleted

## 2010-12-04 MED ORDER — ACETAMINOPHEN-CODEINE #3 300-30 MG PO TABS: 1.0000 | ORAL_TABLET | Freq: Two times a day (BID) | ORAL | Status: DC | PRN

## 2010-12-04 NOTE — Telephone Encounter (Signed)
Rx faxed in.

## 2010-12-11 ENCOUNTER — Other Ambulatory Visit: Payer: Self-pay | Admitting: *Deleted

## 2010-12-12 ENCOUNTER — Encounter: Payer: Self-pay | Admitting: Internal Medicine

## 2010-12-12 ENCOUNTER — Ambulatory Visit (INDEPENDENT_AMBULATORY_CARE_PROVIDER_SITE_OTHER): Payer: Medicare Other | Admitting: Internal Medicine

## 2010-12-12 ENCOUNTER — Other Ambulatory Visit: Payer: Self-pay | Admitting: Internal Medicine

## 2010-12-12 ENCOUNTER — Other Ambulatory Visit (HOSPITAL_COMMUNITY): Payer: Self-pay | Admitting: *Deleted

## 2010-12-12 DIAGNOSIS — E785 Hyperlipidemia, unspecified: Secondary | ICD-10-CM

## 2010-12-12 DIAGNOSIS — E538 Deficiency of other specified B group vitamins: Secondary | ICD-10-CM

## 2010-12-12 DIAGNOSIS — E559 Vitamin D deficiency, unspecified: Secondary | ICD-10-CM

## 2010-12-12 DIAGNOSIS — I10 Essential (primary) hypertension: Secondary | ICD-10-CM

## 2010-12-12 DIAGNOSIS — R609 Edema, unspecified: Secondary | ICD-10-CM | POA: Insufficient documentation

## 2010-12-12 DIAGNOSIS — R011 Cardiac murmur, unspecified: Secondary | ICD-10-CM

## 2010-12-12 DIAGNOSIS — E119 Type 2 diabetes mellitus without complications: Secondary | ICD-10-CM

## 2010-12-12 DIAGNOSIS — D509 Iron deficiency anemia, unspecified: Secondary | ICD-10-CM

## 2010-12-12 DIAGNOSIS — D649 Anemia, unspecified: Secondary | ICD-10-CM

## 2010-12-12 LAB — COMPREHENSIVE METABOLIC PANEL
ALT: 11 U/L (ref 0–35)
AST: 14 U/L (ref 0–37)
Albumin: 4.1 g/dL (ref 3.5–5.2)
Alkaline Phosphatase: 77 U/L (ref 39–117)
BUN: 12 mg/dL (ref 6–23)
CO2: 28 mEq/L (ref 19–32)
Calcium: 8.6 mg/dL (ref 8.4–10.5)
Chloride: 103 mEq/L (ref 96–112)
Creat: 0.56 mg/dL (ref 0.40–1.20)
Glucose, Bld: 129 mg/dL — ABNORMAL HIGH (ref 70–99)
Potassium: 3.5 mEq/L (ref 3.5–5.3)
Sodium: 143 mEq/L (ref 135–145)
Total Bilirubin: 0.5 mg/dL (ref 0.3–1.2)
Total Protein: 6.9 g/dL (ref 6.0–8.3)

## 2010-12-12 LAB — CBC
HCT: 35.2 % — ABNORMAL LOW (ref 36.0–46.0)
Hemoglobin: 12.2 g/dL (ref 12.0–15.0)
MCH: 28.8 pg (ref 26.0–34.0)
MCHC: 34.7 g/dL (ref 30.0–36.0)
MCV: 83.2 fL (ref 78.0–100.0)
Platelets: 326 10*3/uL (ref 150–400)
RBC: 4.23 MIL/uL (ref 3.87–5.11)
RDW: 15.1 % (ref 11.5–15.5)
WBC: 6.7 10*3/uL (ref 4.0–10.5)

## 2010-12-12 LAB — POCT GLYCOSYLATED HEMOGLOBIN (HGB A1C): Hemoglobin A1C: 7.2

## 2010-12-12 LAB — GLUCOSE, CAPILLARY: Glucose-Capillary: 111 mg/dL — ABNORMAL HIGH (ref 70–99)

## 2010-12-12 MED ORDER — CALCIUM CARBONATE 600 MG PO TABS: 600.0000 mg | ORAL_TABLET | Freq: Two times a day (BID) | ORAL | Status: DC

## 2010-12-12 MED ORDER — GLUCOSE BLOOD VI STRP: ORAL_STRIP | Status: DC

## 2010-12-12 MED ORDER — FUROSEMIDE 40 MG PO TABS: 40.0000 mg | ORAL_TABLET | Freq: Every day | ORAL | Status: DC

## 2010-12-12 MED ORDER — VITAMIN D3 25 MCG (1000 UNIT) PO TABS: 1000.0000 [IU] | ORAL_TABLET | Freq: Every day | ORAL | Status: DC

## 2010-12-12 NOTE — Assessment & Plan Note (Signed)
Pt tolerating statin.  WIll check LFTs today.

## 2010-12-12 NOTE — Assessment & Plan Note (Signed)
This appears stable.  Will consider increase in Lasix dose if renal fxn and electrolytes within normal limits on CMET today.

## 2010-12-12 NOTE — Assessment & Plan Note (Signed)
Stable.  WIll check CMET today and consider increase in Lasix if Cr stable for improved control of LE edema.

## 2010-12-12 NOTE — Assessment & Plan Note (Signed)
Pt is doing very well managing her DM2.  WIll not make any changes to her regimen.  Her A1c is at goal at 7.2.  WIll check urine microalbumin/cr.

## 2010-12-12 NOTE — Assessment & Plan Note (Signed)
WIll check vitamin D level today

## 2010-12-12 NOTE — Assessment & Plan Note (Signed)
Pt reports compliance with Fe supplementation.. WIll check CBC today.

## 2010-12-12 NOTE — Patient Instructions (Signed)
Schedule a follow up with Dr. Arvilla Market in 3 months. I will call you if any of your lab work is abnormal. Please call the clinic with any questions or concerns.

## 2010-12-12 NOTE — Assessment & Plan Note (Signed)
Will check CBC today.  

## 2010-12-12 NOTE — Progress Notes (Signed)
  Subjective:    Patient ID: [name] [name], female    DOB: 07-18-1946, 65 y.o.   MRN: [id number]  HPI:  Pt is a 65 y/o F with [facility] outlined in the EMR.  She is here today for routine f/u of her chronic medications.  DM2:  Pt is doing well on her current regimen.  She requests a refill of her testing strips/  Evening CBGs run in the 190-200s,  90-100s in the early afternoon and 110-130s in the mornings.   She reports two asymptomatic hypoglycemic events with CBGs of 39, she reports improvement to 160 on recheck 10-13min after eating.     HTN:  Pt taking all meds as directed without adverse.  Denies h/a, vision changes, or syncope.  CAD:  Pt denies chest pain, SOB.  Sees Dr. Shanon Brow next week; follows up with cards once a year.  Dry throat:  She reports intermittent sensation of dry throat.  Denies any pain, difficulty swallowing food, fluids, or pills.  Deneis fevers, chills, cough, sneezing, or other URI symptoms.    Review of Systems  Constitutional: Negative for fever, chills and fatigue.  HENT: Negative for hearing loss, congestion, rhinorrhea, sneezing, neck pain, neck stiffness and postnasal drip.   Eyes: Negative for photophobia, pain and visual disturbance.  Respiratory: Negative for choking, chest tightness, shortness of breath and wheezing.   Cardiovascular: Positive for leg swelling. Negative for chest pain and palpitations.  Genitourinary: Negative for dysuria, urgency, hematuria, decreased urine volume, difficulty urinating and pelvic pain.  Musculoskeletal: Negative for joint swelling.  Neurological: Negative for dizziness, facial asymmetry, speech difficulty, numbness and headaches.       Objective:   Physical Exam  Constitutional: She is oriented to person, place, and time. She appears well-developed and well-nourished. No distress.  HENT:  Head: Normocephalic and atraumatic.  Eyes: Conjunctivae and EOM are normal. Pupils are equal, round, and reactive to light. Right  eye exhibits no discharge. Left eye exhibits no discharge. No scleral icterus.  Neck: Normal range of motion. Neck supple. No JVD present. No tracheal deviation present. No thyromegaly present.  Cardiovascular: Normal rate and regular rhythm.  Exam reveals no gallop and no friction rub.   Murmur (2/6 SM) heard. Pulmonary/Chest: Effort normal and breath sounds normal. No respiratory distress. She has no wheezes. She has no rales.  Abdominal: She exhibits no distension. There is no tenderness. There is no rebound.  Musculoskeletal: She exhibits edema (+1 pitting edema in bilateral LEs.).  Lymphadenopathy:    She has no cervical adenopathy.  Neurological: She is alert and oriented to person, place, and time. No cranial nerve deficit. Coordination normal.  Skin: Skin is warm and dry. No rash noted. She is not diaphoretic. No erythema.  Psychiatric: She has a normal mood and affect. Her behavior is normal. Judgment and thought content normal.          Assessment & Plan:

## 2010-12-13 LAB — MICROALBUMIN / CREATININE URINE RATIO
Creatinine, Urine: 79.3 mg/dL
Microalb Creat Ratio: 6.3 mg/g (ref 0.0–30.0)
Microalb, Ur: 0.5 mg/dL (ref 0.00–1.89)

## 2010-12-13 LAB — VITAMIN D 25 HYDROXY (VIT D DEFICIENCY, FRACTURES): Vit D, 25-Hydroxy: 20 ng/mL — ABNORMAL LOW (ref 30–89)

## 2010-12-18 ENCOUNTER — Encounter: Payer: Self-pay | Admitting: Cardiology

## 2010-12-18 ENCOUNTER — Ambulatory Visit (INDEPENDENT_AMBULATORY_CARE_PROVIDER_SITE_OTHER): Payer: Medicare Other | Admitting: Cardiology

## 2010-12-18 DIAGNOSIS — E7800 Pure hypercholesterolemia, unspecified: Secondary | ICD-10-CM

## 2010-12-18 DIAGNOSIS — I2510 Atherosclerotic heart disease of native coronary artery without angina pectoris: Secondary | ICD-10-CM

## 2010-12-23 LAB — GLUCOSE, CAPILLARY: Glucose-Capillary: 70 mg/dL (ref 70–99)

## 2010-12-28 LAB — GLUCOSE, CAPILLARY: Glucose-Capillary: 150 mg/dL — ABNORMAL HIGH (ref 70–99)

## 2010-12-29 ENCOUNTER — Other Ambulatory Visit: Payer: Medicare Other

## 2010-12-29 LAB — GLUCOSE, CAPILLARY: Glucose-Capillary: 265 mg/dL — ABNORMAL HIGH (ref 70–99)

## 2010-12-30 ENCOUNTER — Ambulatory Visit (INDEPENDENT_AMBULATORY_CARE_PROVIDER_SITE_OTHER): Payer: Medicare Other | Admitting: *Deleted

## 2010-12-30 DIAGNOSIS — E7800 Pure hypercholesterolemia, unspecified: Secondary | ICD-10-CM

## 2010-12-30 DIAGNOSIS — I2510 Atherosclerotic heart disease of native coronary artery without angina pectoris: Secondary | ICD-10-CM

## 2010-12-30 LAB — GLUCOSE, CAPILLARY: Glucose-Capillary: 229 mg/dL — ABNORMAL HIGH (ref 70–99)

## 2010-12-30 LAB — LIPID PANEL
Cholesterol: 188 mg/dL (ref 0–200)
HDL: 68 mg/dL (ref >39.00)
LDL Cholesterol: 106 mg/dL — ABNORMAL HIGH (ref 0–99)
Total CHOL/HDL Ratio: 3
Triglycerides: 69 mg/dL (ref 0.0–149.0)
VLDL: 13.8 mg/dL (ref 0.0–40.0)

## 2010-12-30 LAB — HEPATIC FUNCTION PANEL
ALT: 18 U/L (ref 0–35)
AST: 20 U/L (ref 0–37)
Albumin: 3.8 g/dL (ref 3.5–5.2)
Alkaline Phosphatase: 81 U/L (ref 39–117)
Bilirubin, Direct: 0.1 mg/dL (ref 0.0–0.3)
Total Bilirubin: 0.7 mg/dL (ref 0.3–1.2)
Total Protein: 7.1 g/dL (ref 6.0–8.3)

## 2011-01-07 ENCOUNTER — Other Ambulatory Visit: Payer: Self-pay | Admitting: Internal Medicine

## 2011-01-07 ENCOUNTER — Ambulatory Visit (HOSPITAL_COMMUNITY)
Admission: RE | Admit: 2011-01-07 | Discharge: 2011-01-07 | Disposition: A | Discharge status: Home / Self Care | Payer: Medicare Other | Source: Ambulatory Visit | Attending: Internal Medicine | Admitting: Internal Medicine

## 2011-01-07 DIAGNOSIS — I10 Essential (primary) hypertension: Secondary | ICD-10-CM | POA: Insufficient documentation

## 2011-01-07 DIAGNOSIS — M7989 Other specified soft tissue disorders: Secondary | ICD-10-CM | POA: Insufficient documentation

## 2011-01-07 DIAGNOSIS — R0609 Other forms of dyspnea: Secondary | ICD-10-CM | POA: Insufficient documentation

## 2011-01-07 DIAGNOSIS — R0989 Other specified symptoms and signs involving the circulatory and respiratory systems: Secondary | ICD-10-CM | POA: Insufficient documentation

## 2011-01-07 DIAGNOSIS — R011 Cardiac murmur, unspecified: Secondary | ICD-10-CM | POA: Insufficient documentation

## 2011-01-07 DIAGNOSIS — I359 Nonrheumatic aortic valve disorder, unspecified: Secondary | ICD-10-CM | POA: Insufficient documentation

## 2011-01-08 NOTE — Assessment & Plan Note (Signed)
Summary: U0A     Primary Provider:  Nelda Bucks DO  CC:  check up.  History of Present Illness: [name] [name] is in for a follow up visit.  She remains totally stable from a cardiac standpoint.  She is not having any chest pain of significance, and does have general medicine follow up.   Current Medications (verified): 1)  Aspirin 81 Mg Tbec (Aspirin) .... Take 1 Tablet By Mouth Once A Day 2)  Furosemide 40 Mg Tabs (Furosemide) .... Take 1 Tablet By Mouth Once A Day 3)  Paxil 30 Mg Tabs (Paroxetine Hcl) .... Take 1 Tablet By Mouth Once A Day. 4)  Lotensin 40 Mg Tabs (Benazepril Hcl) .... Take 1 Tablet By Mouth Once A Day 5)  Glucophage 1000 Mg Tabs (Metformin Hcl) .... Take 1 Tablet By Mouth Two Times A Day 6)  Glucotrol 10 Mg Tabs (Glipizide) .... Take 2 Tablets By Mouth Two Times A Day 7)  Nitroquick  Subl (Nitroglycerin Subl) .... Use As Directed 8)  Imdur 30 Mg Tb24 (Isosorbide Mononitrate) .... Take 1 Tablet By Mouth Once A Day 9)  Pravachol 80 Mg Tabs (Pravastatin Sodium) .... Take 1 Tablet By Mouth Once A Day 10)  Ferrous Sulfate 325 (65 Fe) Mg Tabs (Ferrous Sulfate) .... Take 1 Tablet By Mouth Two Times A Day 11)  Atenolol 100 Mg Tabs (Atenolol) .... Take 1/2 Pill Daily By Mouth. 12)  Tylenol 325 Mg Tabs (Acetaminophen) .... Two Tabs Every Eight Hours As Needed Forpain. 13)  Calcium Carbonate 600 Mg Tabs (Calcium Carbonate) .... Take 1 Tablet By Mouth Two Times A Day 14)  Tylenol With Codeine #3 300-30 Mg Tabs (Acetaminophen-Codeine) .... Take 1 Tablet By Mouth Two Times A Day As Needed For Pain 15)  Cobal-1000 1000 Mcg/ml Soln (Cyanocobalamin) .... Inject 1 Ml Once Monthly. 16)  Vitamin D3 1000 Unit Tabs (Cholecalciferol) .... Take 1 Tablet By Mouth Once A Day 17)  Mag-Delay 535 (64 Mg) Mg Cr-Tabs (Magnesium Chloride) .... Take 1 Tablet By Mouth Two Times A Day 18)  Voltaren 1 % Gel (Diclofenac Sodium) .... Apply Small Amount To Affected Area Up To 4 Times A Day. 19)  Syringe  23g X 3/4" 3 Ml Misc (Syringe/needle (Disp)) .... Use To Injection B12. 20)  Lantus Solostar 100 Unit/ml Soln (Insulin Glargine) .... Inject 5 Units At Bedtime. 21)  Naproxen 375 Mg Tabs (Naproxen) .[name].. 1 By Mouth Two Times A Day For 2 Weeks and Then Bod As Needed Hand Pain 22)  Prodigy Mini Pen Needles 31g X 5 Mm Misc (Insulin Pen Needle) .... Use To Inject Lantus Insulin One Time Each Day  Allergies: No Known Drug Allergies  Past History:  Past Medical History: Last updated: 02/06/2010 Current Problems:  CAD (ICD-414.00) HYPERTENSION (ICD-401.9) DYSLIPIDEMIA (ICD-272.4) DYSPNEA ON EXERTION (ICD-786.09) LEG EDEMA (ICD-782.3) NECK PAIN (ICD-723.1) OBESITY (ICD-278.00) GERD (ICD-530.81) DIABETES MELLITUS, TYPE II (ICD-250.00) SPINAL STENOSIS, LUMBAR (ICD-724.02) DEPRESSION (ICD-311) GLAUCOMA NOS (ICD-365.9) HEMORRHOIDS (ICD-455.6) HYPOMAGNESEMIA (ICD-275.2) DIVERTICULOSIS OF COLON (ICD-562.10) OSTEOPENIA (ICD-733.90) HEALTH SCREENING (ICD-V70.0) ARM PAIN, RIGHT (ICD-729.5) OTHER SCREENING MAMMOGRAM (ICD-V76.12) HYPOKALEMIA (ICD-276.8) ANEMIA, IRON DEFICIENCY (ICD-280.9) DEGENERATIVE JOINT DISEASE (ICD-715.90) B12 def- B12 level 213 on 03/11  Past Surgical History: Last updated: 08/02/2009 Posterior lumbar interfusion surgery at L2-L3 (Dr. Marikay Alar), 07/18/07 Lumbar fusion surgery at L3-L5 (s/p hardware removal in 10/08 on re-exploration), c. 2006 Rotator cuff repair, date unknown  History of shoulder surgery. Cholecystectomy.  endometrial biopsy.  Family History: Last updated: 08/02/2009 Family = 3 brothers,  9 sisters (1 deceased).   Pt's father and sister developed type II DM.   No MIs or strokes in pt's family of which pt is aware. No h/o cancer in family. Pt's 2 sisters have HTN and HL.  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 Single.   Never used tobacco. No alcohol or illicit  drug use. Does exercise regularly. Walks 3 times/wk. Goes to exercise at [facility]  Vital Signs:  Patient profile:   65 year old female Height:      64 inches Weight:      223 pounds BMI:     38.42 Pulse rate:   66 / minute Resp:     14 per minute BP sitting:   142 / 72  (left arm)  Vitals Entered By: Kem Parkinson (December 18, 2010 11:33 AM)  Physical Exam  General:  Pleasant moderately obese female in no distress Head:  normocephalic and atraumatic Eyes:  PERRLA/EOM intact; conjunctiva and lids normal. Lungs:  Clear bilaterally to auscultation and percussion. Heart:  PMI non displaced.  Normal S1 and S2.  No murmur or rub.   Abdomen:  Bowel sounds positive; abdomen soft and non-tender without masses, organomegaly, or hernias noted. No hepatosplenomegaly. Pulses:  pulses normal in all 4 extremities Extremities:  No clubbing or cyanosis. Neurologic:  Alert and oriented x 3.   EKG  Procedure date:  12/18/2010  Findings:      NSR.   WNL.  Minor T flattening.   Cardiac Cath  Procedure date:  05/07/2004  Findings:       ANGIOGRAPHIC DATA:  1. Ventriculography in the RAO projection reveals vigorous global systolic     function.  No segmental abnormalities __________  identified.  2. The right coronary artery is calcified.  There is no significant focal     narrowing at the top.  There is perhaps 20% narrowing in the distal     vessel and then a 40% area of narrowing in the proximal portion of the     PDA.  3. The left main is free of critical disease.  4. The LAD courses to the apex.  There is a fair amount of mild lumen     irregularity in the LAD.  There is a 30% mid stenosis.  The first     diagonal bifurcates proximally into smaller caliber vessel.  5. The circumflex consists of two marginal branches. The first one is quite     tortuous and in one of the bends seen in the LAO caudal view there     appears to be perhaps about 40-50% narrowing.  This is not seen,  however,     in a well laid out view, and the severity of stenosis either could be     under or over estimated.  However, it does not appear critical.   IMPRESSION:  1. Well preserved global systolic function.  2. Diffuse scattered coronary irregularities.   RECOMMENDATIONS:  Continued medical therapy would be warranted at the  present time.  Risk factor reduction is required.    Impression & Recommendations:  Problem # 1:  CAD (ICD-414.00) continues to remain stable.  No symptom progress.  Has multiple RF, and these are being addressed.   Last cath data is 2005.  See information.  No high grade disease. Will continue to monitor annually.  She would like to follow in cardiology annually. Her updated  medication list for this problem includes:    Aspirin 81 Mg Tbec (Aspirin) .[name]... Take 1 tablet by mouth once a day    Lotensin 40 Mg Tabs (Benazepril hcl) .[name]... Take 1 tablet by mouth once a day    Nitroquick Subl (Nitroglycerin subl) ..... Use as directed    Imdur 30 Mg Tb24 (Isosorbide mononitrate) .[name]... Take 1 tablet by mouth once a day    Atenolol 100 Mg Tabs (Atenolol) .[name]... Take 1/2 pill daily by mouth.  Orders: EKG w/ Interpretation (93000)  Problem # 2:  HYPERTENSION (ICD-401.9) stable at present.  well controlled on multi drug regimen.  Atenolol may not be the best option, given DM, but it is affordable, she is stable, and managed closely by primary care.  Her updated medication list for this problem includes:    Aspirin 81 Mg Tbec (Aspirin) .[name]... Take 1 tablet by mouth once a day    Furosemide 40 Mg Tabs (Furosemide) .[name]... Take 1 tablet by mouth once a day    Lotensin 40 Mg Tabs (Benazepril hcl) .[name]... Take 1 tablet by mouth once a day    Atenolol 100 Mg Tabs (Atenolol) .[name]... Take 1/2 pill daily by mouth.  Problem # 3:  DYSLIPIDEMIA (ICD-272.4) tolerating without statin side effects.   Her updated medication list for this problem includes:    Pravachol 80 Mg Tabs  (Pravastatin sodium) .[name]... Take 1 tablet by mouth once a day  Patient Instructions: 1)  Your physician recommends that you continue on your current medications as directed. Please refer to the Current Medication list given to you today. 2)  Your physician wants you to follow-up in: 1 YEAR.   You will receive a reminder letter in the mail two months in advance. If you don't receive a letter, please call our office to schedule the follow-up appointment. 3)  Your physician recommends that you return for a FASTING lipid profile and liver profile: nothing to eat or drink after midnight, lab opens at 8:30, 12/29/10

## 2011-01-12 LAB — GLUCOSE, CAPILLARY: Glucose-Capillary: 142 mg/dL — ABNORMAL HIGH (ref 70–99)

## 2011-01-15 LAB — GLUCOSE, CAPILLARY: Glucose-Capillary: 142 mg/dL — ABNORMAL HIGH (ref 70–99)

## 2011-01-17 LAB — GLUCOSE, CAPILLARY: Glucose-Capillary: 109 mg/dL — ABNORMAL HIGH (ref 70–99)

## 2011-01-20 ENCOUNTER — Telehealth: Payer: Self-pay | Admitting: Dietician

## 2011-01-20 NOTE — Telephone Encounter (Signed)
Patient requests refills on 3 medicine: benazepril Glucotrol   Atenolol   Will route to triage nurse and her primary care physician

## 2011-01-21 ENCOUNTER — Other Ambulatory Visit (INDEPENDENT_AMBULATORY_CARE_PROVIDER_SITE_OTHER): Payer: Medicare Other | Admitting: *Deleted

## 2011-01-21 DIAGNOSIS — I10 Essential (primary) hypertension: Secondary | ICD-10-CM

## 2011-01-21 LAB — GLUCOSE, CAPILLARY
Glucose-Capillary: 118 mg/dL — ABNORMAL HIGH (ref 70–99)
Glucose-Capillary: 151 mg/dL — ABNORMAL HIGH (ref 70–99)

## 2011-01-21 MED ORDER — BENAZEPRIL HCL 40 MG PO TABS: 40.0000 mg | ORAL_TABLET | Freq: Every day | ORAL | Status: DC

## 2011-01-21 MED ORDER — ATENOLOL 100 MG PO TABS: 50.0000 mg | ORAL_TABLET | Freq: Every day | ORAL | Status: DC

## 2011-01-21 MED ORDER — GLIPIZIDE 10 MG PO TABS: 20.0000 mg | ORAL_TABLET | Freq: Two times a day (BID) | ORAL | Status: DC

## 2011-01-21 NOTE — Telephone Encounter (Signed)
Patient called again today about her medications. Spoke with refill nurse today, she it taking care of patients request. Patient to call back tonmorrow if she has not heard from her pharmacy.

## 2011-01-22 LAB — GLUCOSE, CAPILLARY: Glucose-Capillary: 93 mg/dL (ref 70–99)

## 2011-02-09 ENCOUNTER — Other Ambulatory Visit (INDEPENDENT_AMBULATORY_CARE_PROVIDER_SITE_OTHER): Payer: Medicare Other | Admitting: *Deleted

## 2011-02-09 DIAGNOSIS — E785 Hyperlipidemia, unspecified: Secondary | ICD-10-CM

## 2011-02-09 DIAGNOSIS — I10 Essential (primary) hypertension: Secondary | ICD-10-CM

## 2011-02-09 DIAGNOSIS — R609 Edema, unspecified: Secondary | ICD-10-CM

## 2011-02-10 MED ORDER — FUROSEMIDE 40 MG PO TABS: 40.0000 mg | ORAL_TABLET | Freq: Every day | ORAL | Status: DC

## 2011-02-10 MED ORDER — PRAVASTATIN SODIUM 80 MG PO TABS: 80.0000 mg | ORAL_TABLET | Freq: Every day | ORAL | Status: DC

## 2011-02-24 NOTE — Assessment & Plan Note (Signed)
[name] [name]                            CARDIOLOGY OFFICE NOTE   NAME:[name] [name] Billet                       MRN:          [id number]  DATE:08/08/2007                            DOB:          26-Dec-1945    [name] is in for a followup visit.  She is doing well.  She has a  little bit of shortness of breath sometimes and occasional palpitations.  However, she is doing substantially better.  She underwent myocardial  perfusion imaging in August, at that time her ejection fraction was 67%.  She has not had any significant perfusion defects.  Her last  catheterization procedure was done in 2005 and at that time she had  scattered 3-vessel disease, none of which was critically occluded.  She  has continued to be followed in the internal medicine outpatient clinic.   Her medications include Norvasc 5 mg daily, aspirin 81 mg daily, Lasix  120 mg daily, Paxil 30 mg two tablets daily, benazepril 40 mg daily,  Glucophage 1000 mg b.i.d., glipizide 10 mg b.i.d., Imdur 30 mg daily,  Pepcid 20 mg b.i.d., Pravachol 40 mg q.h.s., Neurontin 300 mg daily,  Actos 30 mg daily, and atenolol 100 mg 1/2 tablet daily.   PHYSICAL EXAMINATION:  GENERAL:  She is alert and oriented, in no acute  distress.  VITAL SIGNS:  Her weight is 239 pounds which is up slightly. Blood  pressure is 140/70, the pulse is 84.  LUNGS:  There are no rales noted.  CARDIAC:  Rhythm is regular.  EXTREMITIES:  She does not have significant extremity edema.   The patient has had previous D-dimer which was negative.  She had a  myocardial perfusion study.  She continues to have her follow up in the  outpatient clinic at [facility].  She has had a recent fusion procedure,  and has done reasonably well with this.  She will continue on her  current medical regimen.  Some of the shortness of breath may in fact be  related to Actos so this will need to be watched.     [name] Morton. Riley Kill, MD, [facility]  Electronically Signed    TDS/MedQ  DD: 08/08/2007  DT: 08/08/2007  Job #: [id number]   cc:   [facility]

## 2011-02-24 NOTE — Assessment & Plan Note (Signed)
[facility]                            CARDIOLOGY OFFICE NOTE   NAME:Strider, [name]                       MRN:          [id number]  DATE:11/07/2007                            DOB:          [birth year]    [name] is in for follow-up.  In general she is stable, she has not  been having any ongoing chest pain or significant shortness of breath.  She has continued have some problems with her back.  She has had a  recent CT scan because of that.  The patient did not bring her medicine  list today and she did not bring her medicines and I have encouraged her  to bring both for her follow-up visit.  She has been followed in the  General Medicine Clinic at [facility] and is scheduled to be seen again  next week.  Purportedly, her medicines include Actos, glipizide,  Glucophage, she was also on Neurontin; she has been on furosemide, I  suspect to deal with the fluid issues, and also atenolol.   EXAMINATION:  She is alert and oriented.  The weight is 238 pounds which  is a very stable weight for her, the blood pressure is 110/62 and the  pulse is 73.  LUNG:  Fields are clear to auscultation and percussion.  CARDIAC:  Rhythm is regular and there is no murmur.  She also fortunately does not have any significant edema.   IMPRESSION:  1. Non-insulin dependent diabetes mellitus on multidrug therapy.  2. Scattered coronary irregularities as would be expected with      diabetes, with well-preserved overall systolic function.  3. Obesity.   PLAN:  1. Return to clinic in 6 months.  2. I have encouraged her to bring her medicines to her next office      visit.  3. I have asked her to ask the Internal Medicine Clinic to send a copy      of their note when they see her in follow-up the next week.   ADDENDUM:  EKG reveals normal sinus rhythm with possible left atrial  enlargement, otherwise unremarkable.     Arturo Morton. Riley Kill, MD, [facility]  Electronically  Signed    TDS/MedQ  DD: 11/07/2007  DT: 11/07/2007  Job #: [id number]

## 2011-02-24 NOTE — Assessment & Plan Note (Signed)
[facility]                            CARDIOLOGY OFFICE NOTE   NAME:[name] [name]                       MRN:          [id number]  DATE:05/23/2007                            DOB:          04/21/1946    [name] [name] was seen in the hospital.  She was short of breath over  several days.  Subsequently her medicines were adjusted.  An  echocardiogram was done which was normal.  She has had some swelling in  the feet and some shortness of breath as well as some dizziness.  She  also had noticed a little bit of palpitations in general; however, she  has been doing better since she was in the hospital.  She has not had  any real ischemic evaluation other than negative enzymes at that time.   CURRENT MEDICATIONS:  1. Norvasc 5 mg daily.  2. Aspirin 81 mg daily.  3. Lasix 120 mg daily.  4. Paxil 30 mg 2 tablets daily.  5. Benazepril 40 mg daily.  6. Glucophage 1000 mg b.i.d.  7. Glipizide 10 mg b.i.d.  8. Imdur 30 mg daily.  9. Pepcid 20 mg b.i.d. p.r.n.  10.Pravachol 40 mg nightly.  11.Neurontin 300 mg daily.  12.Actos 30 mg daily.  13.Atenolol 100 mg daily.  14.Atenolol 100 mg daily.   PHYSICAL EXAMINATION:  She is alert and oriented in no acute distress.  The blood pressure is 115/62 and the pulse is 63.  The lung fields are clear with some prolonged expiration which is just  very slight.  Cardiac rhythm is regular without a significant murmur.  Extremities reveal only trace edema.   Recent studies include an echocardiogram.  This echocardiogram reveals  normal left ventricular systolic function with an ejection fraction of  60%.  The right-sided chambers, importantly, are not enlarged, which  would suggest a pulmonary embolus if they were, and no diastolic  abnormalities are noted.   IMPRESSION:  1. Prior cardiac catheterization demonstrating nonobstructive,      scattered coronary artery disease.  2. Well preserved left ventricular  function with normal      echocardiography.  3. Hypertension.  4. Diabetes.   PLAN:  Stress Cardiolite will be done.  She has not had ischemic  evaluation.  She is diabetic with some shortness of breath in rehab.  For completeness sake we will go ahead and do this.  We will then decide  on the best long-term plan.     Arturo Morton. Riley Kill, MD, [facility]  Electronically Signed    TDS/MedQ  DD: 05/23/2007  DT: 05/24/2007  Job #: [id number]

## 2011-02-24 NOTE — Assessment & Plan Note (Signed)
[facility]                            CARDIOLOGY OFFICE NOTE   NAME:[name] [name]                       MRN:          [id number]  DATE:06/06/2007                            DOB:          [birth year]    [name] [name] is in for a followup. She is here to review her Myoview. She  continues to have some shortness of breath. Of note recent 2D  echocardiogram was done at [facility]. This revealed a normal aortic and  mitral valve, an ejection fraction of 60%. Importantly the right-sided  chambers did not appear to be significantly enlarged. We elected to  recommend a Myoview study and this was completed on May 25, 2007.  There was a short burst of PAT in recovery, but the overall myocardial  perfusion study demonstrated and ejection fraction of 67% in a normal  study with fairly impaired exercise tolerance. The patient last  underwent cardiac catheterization in 2005 which demonstrated diffuse  coronary irregularities with 30%, 40%, 50% lesions.   On examination her weight is 236 pounds, blood pressure 140/70, the  pulse is 92.  LUNG FIELDS: Really quite clear.  CARDIAC: Rhythm is regular.   A D-dimer study is done and is less than 0.22.   Importantly, this patient has had recent evaluation. She is controlled  on a variety of medications. She does take Actos, and this potentially  could be contributing to the shortness of breath. Her overall LV  function is normal. Her myocardial perfusion scan is normal. Her 2D  echocardiogram does not demonstrate an enlarged right side, and  importantly her D-Dimer is normal. We will continue to follow her up. I  plan to see her back in the office in about 3 weeks. She may need to  require a major adjustment of her medications.     Arturo Morton. Riley Kill, MD, [facility]  Electronically Signed    TDS/MedQ  DD: 06/08/2007  DT: 06/08/2007  Job #: [id number]

## 2011-02-24 NOTE — Assessment & Plan Note (Signed)
[facility]                            CARDIOLOGY OFFICE NOTE   NAME:[name] [name], [name] [name]                       MRN:          [id number]  DATE:05/23/2007                            DOB:          Aug 21, 1946    [name] is in for a followup visit. To briefly summarize, she has  been in the cardiac rehab program. She is continued to get along  reasonably well with this. She thinks her exercise tolerance has  improved with all of this. She denies any ongoing chest pain or  significant shortness of breath. She has been somewhat more short of  breath, even though she has lost some weight. She has also not been  enrolled in the diabetic education program. When she was seen in the  general medicine clinic, they went ahead and scheduled her to see [place].  She has been on atenolol, Lotensin, and Lasix 80 mg daily. Because of  the swelling in the feet, the atenolol was put in place of the Norvasc.  She continues to have a slight degree of edema in the lower extremities.   PHYSICAL EXAMINATION:  She is alert, oriented and in no distress. Blood  pressure 115/62, pulse 63.  The neck veins are not distended.  The lung fields are actually are relatively clear. The PMI is  nondisplaced and the cardiac rhythm is regular.  EXTREMITIES: Reveal trace edema.   Electrocardiogram is within normal limits.   The patient has some increase in shortness of breath. We will go ahead  and get a exercise stress myoview. I might not be inclined to use  atenolol given the recent data associated with this. I would substitute  it with an alternative agent. I will leave that up to the general  medicine staff and we will see her back in followup in two to three  weeks.     Arturo Morton. Riley Kill, MD, [facility]  Electronically Signed    TDS/MedQ  DD: 05/23/2007  DT: 05/23/2007  Job #: [id number]

## 2011-02-24 NOTE — Op Note (Signed)
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   PROCEDURES:  1. Lumbar re-exploration, with exploration of fusion L3-5 to ensure      solid arthrodesis L3-5.  2. Removal of segmental fixation L3-5 bilaterally.  3. Posterior lumbar interbody fusion L2-3 utilizing 10 x 22 mm PEEK      interbody cage packed with local autograft and Actifuse putty and a      10 x 22 mm tangent interbody bone wedge.  4. Intertransverse arthrodesis L2-3 utilizing locally harvested      morselized autologous bone graft and Actifuse.  5. Nonsegmental fixation L2-3 utilizing the Legacy pedicle screw      system.   SURGEON:  Tia Alert, M.D.   ASSISTANT:  Donalee Citrin, M.D.   ANESTHESIA:  General endotracheal.   COMPLICATIONS:  Small unintended durotomy over the right L3 nerve root,  repaired primarily with a single 6-0 Prolene suture.   INDICATION FOR PROCEDURE:  Ms. Greiner is a 65 year old black female  with a history of diabetes mellitus who underwent a two-level  decompression with an instrumented fusion about 2 years ago from L3-5.  She did quite well with this but then developed the onset of nocturnal  leg pain.  It then became pain when she ambulated during the day.  She  had an MRI and then a CT myelogram which showed severe spinal stenosis  at the adjacent level at L2-3, with a solid arthrodesis by x-ray L3-5.  I recommended lumbar re-exploration with removal of hardware L3-5 and a  PLIF at L2-3.  She  understood the risks, benefits, and suspected outcome  and wished to proceed.   DESCRIPTION OF THE PROCEDURE:  The patient was taken to the operating  room, and after induction of adequate generalized endotracheal  anesthesia she was rolled into the prone position on chest rolls, and  all pressure points were padded.  Her lumbar region was prepped with  DuraPrep and then draped in the usual sterile fashion.  10 mL of local  anesthesia was injected, and her old incision was opened, and the  incision was carried down to the fascia, which was then opened, and the  paraspinous musculature was taken down in a subperiosteal fashion to  expose the spinous processes of L1 and L2.  I then carried the  dissection out laterally and identified the pedicle screws from L3 to  L5.  Once this was all identified and cleaned of scar tissue, I removed  the locking caps from the from the pedicle screw/rod construct, removed  the crosslink, and then removed  the screws in succession and explored my  fusion from L3 to L5.  I had a solid arthrodesis.  I then removed the  spinous process of L2 and performed complete laminectomies,  hemifacetectomies, and foraminotomies at L2-3.  The L2 and L3 nerve  roots were identified and decompressed.  A small unintended durotomy  measuring about 2 mm was created over the L3 nerve root on the right  side, with minimal leakage of spinal fluid.  I repaired this with a  single 6-0 Prolene suture.  I then continued to decompress the lateral  recesses and then performed wide foraminotomies to decompress the  central canal and the nerve roots at L2 and L3.  I then coagulated the  epidural venous vasculature, cut the disc space sharply bilaterally, and  performed the initial diskectomy with pituitary rongeurs.  We then  distracted the disc space sequentially up to 10 mm with sequential  distraction devices, and then prepared the endplates bilaterally  utilizing the 10 mm rotating  cutter, Epstein curettes, and the 10 mm  chisel.  The midline was prepared with curettes, and once the complete  diskectomy was completed, we used a 10 x 22 mm PEEK interbody cage on  the patient's left side and a 10 x 22 mm tangent interbody bone wedge on  the patient's right side, and the midline was packed with local  autograft and Actifuse. Once our PLIF was complete, I located the  pedicle screw entry zone at L2 bilaterally utilizing surface landmarks  and lateral fluoroscopy.  I probed each pedicle with a pedicle probe,  tapped each pedicle with a 4.5 tap, and then placed five 5 x 40 mm  pedicle screws in the pedicles of L2 bilaterally.  We tested our pedicle  at L3 bilaterally.  These were nice and tight.  Therefore, we used  lordotic rods and placed these into multiaxial screw heads of the  pedicle screws and locked these into position with the locking caps and  anti-torque device after achieving compression on our grafts.  We  decorticated the transverse processes of L2 and L3 and placed a mixture  of locally harvested morselized autologous bone graft and Actifuse putty  out over these to perform intertransverse arthrodesis at L2-3  bilaterally.  We then irrigated with saline solution containing  bacitracin, dried all bleeding points as best we could, lined the  durotomy with Duragen and Tisseel fibrin glue, and the rest of the dura  with Gelfoam, placed a medium Hemovac drain through a separate stab  incision, and then closed the muscle and the fascia with 0 Vicryl, and  closed subcutaneous and subcuticular tissues with 2-0 and 3-0 Vicryl,  and closed skin with Benzoin and Steri-Strips.  The drapes were removed.  A sterile dressing was applied.  The patient was awakened from general  anesthesia and transported to the recovery room in stable condition.  At  the end of the procedure, all sponge, needle, and instrument counts were  correct.      Tia Alert, MD   Electronically Signed     DSJ/MEDQ  D:  07/18/2007  T:  07/19/2007  Job:  [id number]

## 2011-02-24 NOTE — Letter (Signed)
June 12, 2008    Adonis Housekeeper, MD  [facility]  [address]  [place], [place] [address]   RE:  [name] [name], [name] [name]  MRN:  [id number]  /  DOB:  Mar 27, 1946   Dear Dr. Liliane Channel:   I had the pleasure of seeing your nice patient Taylon Louison in the  office today in followup.  She seems to be getting along extremely well.  She denies any progressive chest pain.  She occasionally has a little  bit of dizziness.  Importantly, she feels overall well.   Currently, as you know, her overall systolic function was vigorous.  Her  coronaries demonstrate mild scattered plaquing without critical disease  as would be anticipated with underlying diabetes.  Echocardiography has  revealed an ejection fraction of 60% with normal aortic and mitral valve  function.   She tells me that her labs are being done in your office.   CURRENT MEDICATIONS:  1. Aspirin 81 mg daily.  2. Lasix 120 mg daily.  3. Paxil 30 mg 2 tablets daily.  4. Benazepril 40 mg daily.  5. Glucophage 1000 mg b.i.d.  6. Glipizide 10 mg b.i.d.  7. Imdur 30 mg daily.  8. Pepcid 20 mg b.i.d.  9. Pravachol 40 mg nightly.  10.Neurontin 300 mg daily.  11.Actos 30 mg daily.  12.Atenolol 100 mg daily one-half tablet daily.  13.Ferrous sulfate 325 mg b.i.d.  14.Magnesium oxide 400 mg b.i.d.   On physical, she is alert and oriented, pleasant, in no distress.  The  blood pressure is 120/60, pulse is 62.  The lung fields are clear and  the cardiac rhythm is regular.  I do not appreciate a significant  murmur.  Fortunately, there is no substantial lower extremity edema.   Electrocardiogram is entirely within normal limits.   I am pleased with how she is doing and I would not make any changes in  her overall medical regimen.  Continue weight loss would be beneficial  as well as continued exercise program.  It is important that she have  careful laboratory followup given her iron deficiency as well as high-  dose  use of diuretics.  She ensures that her labs have been done there.   Thanks for allowing me to share in her care.  We will see her back in  followup in 1 year or sooner should further problems arise.    Sincerely,      Arturo Morton. Riley Kill, MD, [facility]  Electronically Signed    TDS/MedQ  DD: 06/12/2008  DT: 06/13/2008  Job #: [id number]

## 2011-02-24 NOTE — Discharge Summary (Signed)
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   PROCEDURE:  Removal of hardware L3-L5 with posterior lumbar interbody  fusion with non-segmental instrumentation L2-3.   BRIEF HISTORY OF PRESENT ILLNESS:  [name] [name] is a 65 year old black  female with a history of diabetes mellitus who underwent a 2-level  decompression with an instrumented fusion about 2 years ago from L3-L5.  She did quite well with that but more recently developed nocturnal leg  pain which then became pain when she ambulated.  She had an MRI and then  a CT myelogram which showed severe spinal stenosis at the adjacent level  at L2-3 with a solid arthrodesis at L3-L5.  I recommended a lumbar  reexploration with the removal of hardware L3-L5 and a plus at L2-3.  She understood the risks and benefits, expressed understanding and  wished to proceed.   HOSPITAL COURSE:  The patient was admitted on July 18, 2007 and  underwent a posterior lumbar interbody fusion at L2-3.  For details of  the operative procedure, please see the dictated operative note.  The  patient's hospital course was routine.  There were no complications.  She had a small dural tear at surgery and spent the first 48 hours at  flat bed rest.  Hemovac drain was in place and had minimal output.  She  was allowed out of bed on postop day 2 in which time she ambulated quite  well with a walker, and with physical therapy she continued to make  strides with them.  She remained afebrile with stable vital signs.  Her  incision remained clean, dry, and intact.   Her pain was well-controlled  on oral pain medications, and she was discharged to home in stable  condition on July 23, 2007 with plans for home health physical  therapy and occupational therapy.   DISCHARGE MEDICATIONS:  Included Percocet and Flexeril.  She was to  continue her home medications as she was taking prior to surgery.   Her return appointment is in 2 weeks with Dr. Yetta Barre.   FINAL DIAGNOSIS:  Posterior lumbar interbody fusion at L2-3.      Tia Alert, MD  Electronically Signed     DSJ/MEDQ  D:  07/23/2007  T:  07/24/2007  Job:  [id number]

## 2011-02-27 NOTE — Discharge Summary (Signed)
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   PROCEDURE:  Posterior lumbar interbody fusion, L3-4, L4-5.   SECONDARY DIAGNOSES:  1.  Diabetes mellitus.  2.  Hypertension.  3.  Anemia.   BRIEF HISTORY OF PRESENT ILLNESS:  Ms. Herbison is a 65 year old female with  a history of diabetes mellitus and hypertension, who had undergone a  previous decompressive laminectomy at L4-5 with an onlay fusion.  She had a  recurrence of significant back pain with right leg pain and was found to  have adjacent-level stenosis at L3-4 with some segmental instability at L4-  5.  I a recommended lumbar reexploration with repeat decompression and  instrumented fusion at L3 to L5.  She understood the risks, benefits and  expected outcome and wished to proceed.   HOSPITAL COURSE:  The patient was admitted on October 14, 2005, and taken the  operating room, where she underwent a posterior lumbar interbody at L3-4, L4-  5.  The patient tolerated the procedure well and was taken to the recovery  room and then to the floor in stable condition.  For details of the  operative procedure, please see the dictated operative note.  The patient's  hospital course was routine.  There were no complications.  She was a little  slow to mobilize and remained in bed for about three days postoperatively  with a Foley catheter in place, with very little mobility.  Physical and  occupational therapy were ordered for the patient.  She remained afebrile  with stable vital signs.  Her wound remained clean, dry and  intact.  She had  good strength in her lower extremities.  She had no significant leg pain but  had considerable back soreness.  She had a Dilaudid PCA for two days for  pain control, and was switched to oral pain medications including Percocet  and Flexeril.  She continued to progress with physical and occupational  therapy to the point that she was ambulating around the room without  difficulty.  Physical and occupational therapy thought she was safe for  discharge with home health PT, OT and nursing aide.  She was discharged home  in stable condition on October 19, 2005.  Of note, she did have acute blood  loss anemia following the surgery requiring transfusion of two units packed  red blood cells.   DISCHARGE MEDICATIONS:  1.  Percocet 5/325 mg one to two p.o. q.6h. p.r.n. pain, 80 pills with no      refills.  2.  Flexeril 10 mg p.o. t.i.d. p.r.n. spasm, 60 pills, refill.   Her return appointment is in two weeks  with Dr. Yetta Barre.   FINAL DIAGNOSIS:  Posterior lumbar interbody fusion, L3-4, L4-5.      Tia Alert, MD  Electronically Signed     DSJ/MEDQ  D:  10/19/2005  T:  10/19/2005  Job:  [id number]

## 2011-02-27 NOTE — Op Note (Signed)
[name], [name]                ACCOUNT NO.:  [id number]   MEDICAL RECORD NO.:  [id number]          PATIENT TYPE:  INP   LOCATION:  2899                         FACILITY:  [facility]   PHYSICIAN:  Tia Alert, MD     DATE OF BIRTH:  [birth year]   DATE OF PROCEDURE:  03/19/2005  DATE OF DISCHARGE:                                 OPERATIVE REPORT   PREOPERATIVE DIAGNOSES:  Lumbar spondylosis with severe lumbar spinal  stenosis at L4-5 with grade 1 spondylolisthesis at L4-5.   POSTOPERATIVE DIAGNOSES:  Lumbar spondylosis with severe lumbar spinal  stenosis at L4-5 with grade 1 spondylolisthesis at L4-5.   PROCEDURES:  1.  Decompressive lumbar hemilaminectomy, medial facetectomy and      foraminotomy at L4-5 on the right side with sublaminar decompression for      central canal and left lateral recess decompression.  2.  Onlay posterolateral fusion of L4-5 on the left utilizing local      autograft mixed with Actifuse.   SURGEON:  Tia Alert, M.D.   ASSISTANT:  Reinaldo Meeker, M.D.   ANESTHESIA:  General endotracheal.   COMPLICATIONS:  None apparent.   INDICATIONS FOR THE PROCEDURE:  [name] [name] is a 65 year old black female  who was referred for back pain with bilateral leg pain. She had neurogenic  claudication. She had an MRI which showed severe stenosis at L4-5. She had  more moderate stenosis at L3-4. She had a grade 1 spondylolisthesis at L4-5.  I recommended lumbar decompression with onlay fusion to address both her  segmental instability and the spinal stenosis. She understood the risks,  benefits and expected outcome and wished to proceed.   DESCRIPTION OF PROCEDURE:  The patient is taken to the operating room. After  induction of adequate general endotracheal anesthesia, she was rolled into  the prone position on the Wilson frame and all pressure points were padded.  Her lumbar region was prepped with DuraPrep and then draped in the usual  sterile fashion. Then  8 mL of local anesthesia were injected and then a  dorsal midline incision was made and carried down to the lumbosacral fascia.  The fascia was opened and the paraspinous musculature was taken out in  subperiosteal fashion to expose the L4-5 interspace. Intraoperative x-ray  confirmed my level and then I used the high-speed drill and the Kerrison  punch to perform a hemilaminectomy, medial facetectomy and foraminotomy at  L4-5 on the right side. The yellow ligament was quite overgrown and causing  severe compression both in the midline and in a lateral recess. I removed it  from the lateral recess first and then expose the lateral edge of the dura  and dissected out to the medial pedicle wall and decompressed this with a  Kerrison punch. The L5 nerve root was identified. I also identified the  medial pedicle wall and performed a very generous foraminotomy at L4-5 on  the right side. I then used the drill to drill up under the spinous process  and then used the Kerrison punch to perform a sublaminar decompression,  decompressing over to the left lateral recess. I identified the pedicle and  the left L5 nerve root. Once the decompression was complete I irrigated with  saline solution containing bacitracin, and I inspected for stability. I used  the Leksell rongeur to pull on the spinous process. She appeared to be  fairly fused at this level already. There was very little motion in the  facets and she had a lot of facet arthropathy and overgrowth. She did move  at L3-4. I then decided to lay a mixture of local autograft saved during the  decompression with Actifuse out over the posterior elements at L4-5 after I  decorticated these with the high-speed drill, but decided to use no  instrumentation at this time because of the amount of stability she had even  though she had a spondylolisthesis at this level. We then dried all bleeding  points and closed the fascia with interrupted 1-0 Vicryl,  closed the  subcutaneous and subcuticular tissues with 2-0 and 3-0 Vicryl and closed the  skin with Dermabond. The drapes were removed. A sterile dressing was  applied. The patient was awakened from anesthesia and transported to the  recovery room in stable condition. At the end of the procedure, all sponge,  needle and sponge counts were correct.       DSJ/MEDQ  D:  03/19/2005  T:  03/19/2005  Job:  [id number]

## 2011-02-27 NOTE — Op Note (Signed)
NAMESHAELIN, LALLEY                ACCOUNT NO.:  [id number]   MEDICAL RECORD NO.:  [id number]          PATIENT TYPE:  INP   LOCATION:  3009                         FACILITY:  [facility]   PHYSICIAN:  Tia Alert, MD     DATE OF BIRTH:  Nov 10, 1945   DATE OF PROCEDURE:  10/14/2005  DATE OF DISCHARGE:                                 OPERATIVE REPORT   PREOPERATIVE DIAGNOSIS:  Adjacent level stenosis, L3-4, with  spondylolisthesis, L4-5, with back and right leg pain.   POSTOPERATIVE DIAGNOSIS:  Adjacent level stenosis, L3-4, with  spondylolisthesis, L4-5, with back and right leg pain.   PROCEDURE:  1.  Lumbar re-exploration with repeat decompressive laminectomies,      hemifacetectomies and foraminotomies, L3-4 and L4-5, requiring more work      than is usually required for the simple posterior lumbar interbody      fusion procedure.  2.  Posterior lumbar interbody fusion, L3-4 and L4-5, utilizing 10 x 22-mm      PEEK interbody cages packed with local autograft and VITOSS soaked with      bone marrow aspirate obtained through a separate stab incision and 10 x      22-mm Tangent interbody bone grafts.  3.  Intertransverse arthrodesis, L3 to L5 bilaterally, utilizing VITOSS      soaked with bone marrow aspirate mixed with of local autograft.  4.  Segmental fixation, L3 to L5 bilaterally, utilizing the Legacy pedicle      screw system.   SURGEON:  Marikay Alar, MD   ASSISTANT:  Donalee Citrin, MD   ANESTHESIA:  General endotracheal.   COMPLICATIONS:  None apparent.   INDICATIONS FOR PROCEDURE:  Ms. Mccartin is a 65 year old female who had  undergone a previous decompressive laminectomy with onlay fusion at L4-5,  where she had a grade 1 spondylolisthesis.  Postoperatively, she did well  for a short while and then had residual back and right leg pain.  MRI and CT  myelogram showed significant stenosis at L3-4 adjacent to the previous onlay  fusion along with a grade 1 spondylolisthesis at  L4-5 and some residual  stenosis.  I recommended a lumbar re-exploration, repeat decompression  followed by instrumented fusion.  She understood the risks, the benefits and  expected outcome and she wished to proceed.   DESCRIPTION OF PROCEDURE:  The patient was taken to the operating room and  after induction of adequate generalized endotracheal anesthesia, she was  rolled into the prone position on chest rolls and all pressure points were  padded.  Her lumbar region was prepped with DuraPrep and then draped in the  usual sterile fashion.  Ten milliliters of local anesthesia were injected  and then a dorsal midline incision was made and carried down to the  lumbosacral fascia.  The fascia was opened and the paraspinous musculature  was taken down in a subperiosteal fashion to expose L3-4 and L4-5  bilaterally.  Intraoperative x-ray confirmed my level.  The previous bone  graft material was identified at L4-5 on the left side; this was removed  with a [employer]  rongeur; the spinous processes were also removed at L3 and L4  by a Leksell rongeur and then a complete laminectomy, hemigastrectomy and  bilateral foraminotomies were performed at L3-L4 to decompress the L3, L4  and L5 nerve roots.  There was quite a bit of scarring from the previous  decompression, especially on the right at L4-5.  I was able to work through  the scar and tease this away from the bony edges and then performed the  hemigastrectomy and this got me out over the disk spaces laterally.  I  coagulated the epidural venous vasculature over the disk spaces bilaterally;  I was started at the L4-5 level and incised the disk space on the patient's  left side and distract the disk space up to 10 mm.  I then on the right side  incised the disk space and prepared the endplates with a rotating cutter,  round scraper and 10-mm cutting chisel.  We then used a 10 x 22-mm Tangent  interbody bone graft into the interspace at L4-5 on the  right side.  We then  on the left side prepared the endplates in the same way with the rotating  cutter, round scraper and cutting chisel.  We then packed the midline with  VITOSS soaked with bone marrow aspirate obtained through a separate stab  incision over the right iliac crest.  This was mixed with local autograft  and packed into the midline, and then a 10 x 22-mm PEEK interbody cage was  packed with this same mixture and tapped into position at L4-5 on the left  side.  We then turned our attention to the L3 level and performed the exact  same steps.  We incised the disk space at L3-4 on the left and distracted  the disk space to 10 mm and then prepared the disk space on the right side  with the rotating cutter, round scraper and 10-mm cutting chisel.  We then  placed a 10 x 22-mm Tangent interbody bone graft into the interspace at L3-4  on the right.  We packed the midline with local autograft mixed with VITOSS  soaked with bone marrow aspirate and then placed a  PEEK interbody cage  packed with this same mixture on the patient's left side.  We did this under  fluoroscopic guidance.  Once the PLIF was complete, we turned our attention  to the segmental fixation.  We localized the pedicle screw entry zones at  L3, L4 and L5, probed each pedicle under fluoroscopic guidance, tapped each  pedicle with a 5.5 tap, palpated each pedicle to assure no break in the  cortex and then placed 6.5 x 40-mm pedicle screws into the pedicles of L3,  L4 and L5 bilaterally and checked these under fluoroscopy.  We then  decorticated the transverse processes at L3, L4 and L5 and placed a mixture  of local autograft, VITOSS and bone marrow aspirate out over these,  performing intertransverse arthrodesis, L3 to L5.  We then used 2 lordotic  rods and placed these into the multiaxial screw heads of the pedicle screws and locked these into position with the locking caps and the antitorque  device.  We then  placed a separate cross-link.  We then irrigated with  copious amounts of bacitracin-containing saline solution, placed a medium  Hemovac drain through a separate stab incision, lined the dura with Gelfoam,  dried all bleeding points with Bovie cautery, then closed the muscle and  fascia with an interrupted #  1 Vicryl, closed the subcutaneous and  subcuticular tissue with 2-0 and 3-0 Vicryl and closed the skin with Benzoin  and Steri-Strips.  The drapes removed.  A sterile dressing was applied.  The  patient was awakened from anesthesia and transferred to the recovery room in  stable condition.  At the end of the procedure, all sponge, needle and  instrument counts were correct.      Tia Alert, MD  Electronically Signed     DSJ/MEDQ  D:  10/14/2005  T:  10/15/2005  Job:  [id number]

## 2011-02-27 NOTE — Group Therapy Note (Signed)
   NAME:  [name] [name], [name] [name]                            ACCOUNT NO.:   MEDICAL RECORD NO.:                             PATIENT TYPE:   LOCATION:  [facility]                           FACILITY:   PHYSICIAN:  Ellis Parents, MD                 DATE OF BIRTH:   DATE OF SERVICE:                                    CLINIC NOTE   HISTORY:  This 65 year old postmenopausal female comes in with a history of  vaginal spotting of approximately two months' duration.  The patient had a  spontaneous menopause at age 45.  She is not on any estrogen supplement.  She has had yearly Pap smears, and the last Pap smear was approximately one  year ago.   PHYSICAL EXAMINATION:  The vagina is clean.  The cervix is slightly  hypertrophic, and with contact of the endocervix there is some slight  bleeding.  Endometrial biopsy is performed.  The uterus sounds to 7 cm in  depth.  Exploration revealed very, very scant specimen, and there was fresh  bleeding after insertion of the intrauterine biopsy cannula indicating some  chronic endocervicitis.  The patient is to return in three weeks to re-  evaluate the cervix and inform the patient of the pathology report.                                                Ellis Parents, MD    SA/MEDQ  D:  07/13/2003  T:  07/15/2003  Job:  [id number]

## 2011-02-27 NOTE — Cardiovascular Report (Signed)
NAME:  [name] [name], [name] [name]                          ACCOUNT NO.:  [id number]   MEDICAL RECORD NO.:  [id number]                   PATIENT TYPE:  OIB   LOCATION:  5511                                 FACILITY:  [facility]   PHYSICIAN:  Arturo Morton. Riley Kill, M.D. [facility]         DATE OF BIRTH:  Feb 19, 1946   DATE OF PROCEDURE:  DATE OF DISCHARGE:                              CARDIAC CATHETERIZATION   WEIGHT:  Is 173.   INDICATIONS:  [name] is a 65 year old woman who presents with chest  pain.  She has a history of diabetes.  The symptoms have been suggestive of  coronary ischemia, because of this she was brought to the cath lab for  further evaluation and treatment.   PROCEDURE:  1. Left heart catheterization.  2. Selective coronary arteriography.  3. Selective left ventriculography.   DESCRIPTION OF PROCEDURE:  Through an anterior puncture, the right femoral  artery was easily entered.  A 6 French sheath was placed.  Views of the left  and right coronary arteries were obtained and multiple angiographic  projections, central aortic, and left ventricular pressures were measured  with a pigtail.  Ventriculography was performed in the RAO projection.  There were no complications.  She was taken to the holding area in  satisfactory clinical condition.   HEMODYNAMIC DATA:  1. Central aortic pressure 121/63, mean 89.  2. Left ventricle 138/10.  3. No significant gradient or pullback across the aortic valve.   ANGIOGRAPHIC DATA:  1. Ventriculography in the RAO projection reveals vigorous global systolic     function.  No segmental abnormalities __________  identified.  2. The right coronary artery is calcified.  There is no significant focal     narrowing at the top.  There is perhaps 20% narrowing in the distal     vessel and then a 40% area of narrowing in the proximal portion of the     PDA.  3. The left main is free of critical disease.  4. The LAD courses to the apex.  There is a fair  amount of mild lumen     irregularity in the LAD.  There is a 30% mid stenosis.  The first     diagonal bifurcates proximally into smaller caliber vessel.  5. The circumflex consists of two marginal branches. The first one is quite     tortuous and in one of the bends seen in the LAO caudal view there     appears to be perhaps about 40-50% narrowing.  This is not seen, however,     in a well laid out view, and the severity of stenosis either could be     under or over estimated.  However, it does not appear critical.   IMPRESSION:  1. Well preserved global systolic function.  2. Diffuse scattered coronary irregularities.   RECOMMENDATIONS:  Continued medical therapy would be warranted at the  present time.  Risk factor  reduction is required.                                               Arturo Morton. Riley Kill, M.D. [facility]    TDS/MEDQ  D:  05/07/2004  T:  05/07/2004  Job:  [id number]   cc:   Arturo Morton. Riley Kill, M.D. [facility]   Erick Colace, Dr.  [facility]

## 2011-02-27 NOTE — Discharge Summary (Signed)
[name], [name]                ACCOUNT NO.:  [id number]   MEDICAL RECORD NO.:  [id number]          PATIENT TYPE:  OBV   LOCATION:  3738                         FACILITY:  [facility]   PHYSICIAN:  C. Ulyess Mort, M.D.DATE OF BIRTH:  11/04/1945   DATE OF ADMISSION:  03/17/2007  DATE OF DISCHARGE:  03/18/2007                               DISCHARGE SUMMARY   DISCHARGE DIAGNOSES:  1. Tachycardia secondary to deconditioning.  2. Hypertension.  3. Restrictive lung disease.  4. Diabetes type 2 with a hemoglobin Alc of 7.2.  5. Depression.  6. Gastroesophageal reflux disease.  7. Degenerative joint disease.  8. A history of lumbar spinal stenosis status post fusion of L3-L4 and      L4-L5.  9. Glaucoma.  10.She is status post a rotator cuff repair.  11.Coronary artery disease with the last catheterization July of 2005      showing a well-preserved ejection fraction with diffuse scattered      irregularities but no myocardial infarction and no stents.  12.Morbid obesity.  13.Chronic lower extremity edema.  14.Hypomagnesemia, resolved  15.Hypokalemia, resolved   DISCHARGE MEDICATIONS:  1. Aspirin 81 mg by mouth once a day.  2. Lasix 120 mg by mouth once a day.  3. Paxil 60 mg by mouth once a day.  4. Lotensin 40 mg by mouth once a day.  5. Glucophage 1 g by mouth twice a day.  6. Glucotrol 10 mg by mouth twice a day.  7. Imdur 30 mg by mouth once a day.  8. Pepcid 20 mg by mouth once a day.  9. Neurontin 300 mg once a day.  10.Pravachol 80 mg once a day.  11.Iron sulfate 325 mg twice a day.  12.Atenolol 50 mg once a day.   DISPOSITION AND FOLLOWUP:  The patient was stable on discharge.  With  cardiac etiology ruled out, really the explanation for her tachycardia  in the clinic and during her physical therapy was deemed secondary to  deconditioning with no underlying cardiac etiology, so she can keep her  previously scheduled appointment with [name] [name], at which time she  can  have her regular routine evaluation to see if she has had any  continuation of her symptoms, and they can determine if any further  workup needs to be obtained.  Also, she is to keep her next scheduled  appointment with the outpatient clinic for followup of her chronic  medical issues.   PROCEDURES PERFORMED DURING THIS HOSPITALIZATION:  Included a 2-D echo  which showed an overall left ventricular systolic function that was  normal with an ejection fraction of 60% with no evidence of left  ventricular regional wall abnormalities and normal aortic and mitral  valves.   BRIEF ADMITTING HISTORY AND PHYSICAL:  [name] [name] is a 65 year old  female with a history of CAD but no history of MI who says that over the  last 4 days while at her pulmonary rehab center she has tachycardia,  shortness of breath, and lightheadedness and generalized weakness  associated with exercise.  It has been getting worse over the  last 2  days, worse on exertion, relieved with rest, not related to eating.  She  denies chest pain, nausea, vomiting, pressure on her chest, or diarrhea.   PHYSICAL EXAMINATION:  VITAL SIGNS:  Temperature 98.2, blood pressure  132/72, pulse was 100, but when ambulating it was 140, respiratory rate  22, O2 sat 99% on room air, dropped to 94 or 95% with ambulation.  She  weighed 238 pounds with a BMI of 41.  GENERAL APPEARANCE:  Morbidly obese, lying in bed, in no apparent  distress.  NECK:  She had no carotid bruits and no JVD.  LUNGS:  Clear to auscultation bilaterally with no wheezes, crackles, and  good air movement.  CARDIOVASCULAR EXAM:  S1, S2, no murmur, regular rate and rhythm with a  heart rate between 92 and 96.  ABDOMEN:  Bowel sounds positive, no tenderness, rebound, or guarding.  Her abdomen was soft and obese.  EXTREMITIES:  She had 1+ edema bilaterally half way up the knee.  SKIN:  Cool and dry.  NEURO EXAM:  Alert and oriented x3, cranial nerves III through  XII  intact, neuro exam nonfocal.  PSYCHIATRIC:  She was very pleasant and focused.   Sodium 138, potassium 3.3, chloride 101, bicarb 28, BUN 19, creatinine  0.7, glucose 155, calcium 8.8, magnesium 1.4.  Total bilirubin 0.5, AST  16, ALT 13, albumin 3.3.  White blood cell count 5.7, hemoglobin 11.4,  hematocrit 34, platelets 272.  BNP less than 30, troponin 0.01 and then  0.01 again, CK-MB went from 1.3 to 1.4.   For more detailed history and physical, please refer to the chart.   HOSPITAL COURSE PROBLEM BY PROBLEM:  Issue:  1. Tachycardia, shortness of breath, and lightheadedness on exertion,      rule out MI.  Cardiac enzymes cycled, negative.  EKG was negative.      When ambulating while having a monitor attached, she showed no      evidence of cardiac events, and a 2-D echo was negative as well, so      with cardiac etiology ruled out, and it did not seem apparent that      she had any true symptoms with ambulation, it is likely this is      with deconditioning, and she can be followed up as an outpatient.  2. Hypomagnesemia.  She had a magnesium of 1.4, potassium 3.3 which      responded to repletion.  She was advised to improve her dietary      intake, and this certainly can be followed up as an outpatient on      whether she has hypomagnesemia or low potassium levels.  It is      possible that the low potassium could be in response to the Lasix      that she takes, and she will be BMETs in the future to make sure      that her potassium levels are okay, and if they are not, she will      need her magnesium rechecked and may need to start      magnesium/potassium supplementation in the future.  3. Hypertension.  Did have some episodes of hypertension during her      hospitalization, but she did also have hypotension as her Lasix was      increased to try to relieve the edema.  She was restarted on her      home medications, and she can have her blood  pressures followed up       as an outpatient.  4. Diabetes type 2.  In the clinic, her hemoglobin Alc was 7.1, in the      hospital it was 8.4.  I would recheck in 3 months in addition to      outpatient titration based on her CBGs throughout the day, and this      can be done by her primary care physician.  5. Mild anemia, it appeared stable.  She has a history of iron-      deficiency anemia.  She had no evidence of vaginal bleeding and her      stools have been black since starting the iron.  Because her      hemoglobin is unchanged since March, a detailed workup was not      obtained.  We will continue with iron supplements, and we will      follow up as an outpatient.   DISCHARGE LABS:  anemia panel, reticulocytes 1%, iron 61, total iron-  binding capacity of 320 but a percentage saturation of 19, vitamin B12  is 474, ferritin was 29, and RBC folate was 369.  Her TSH was 1.577,  magnesium was 1.4, and she received 2 g of repletion for that magnesium.  Troponin cycle negative.   VITAL SIGNS:  Temperature 97.4, pulse 65, respirations 18, blood  pressure 109/59, O2 sat 97% on room air.      Valetta Close, M.D.  Electronically Signed      C. Ulyess Mort, M.D.  Electronically Signed    JC/MEDQ  D:  03/22/2007  T:  03/23/2007  Job:  [id number]   cc:   Arturo Morton. Riley Kill, MD, [facility]

## 2011-02-27 NOTE — Procedures (Signed)
[name], [name] [name]                ACCOUNT NO.:  [id number]   MEDICAL RECORD NO.:  [id number]          PATIENT TYPE:  OUT   LOCATION:  SLEEP CENTER                 FACILITY:  [facility]   PHYSICIAN:  Clinton D. Maple Hudson, M.D. DATE OF BIRTH:  26-Dec-1945   DATE OF STUDY:                              NOCTURNAL POLYSOMNOGRAM   REFERRING PHYSICIAN:  Dr. Ellie Lunch.   DATE OF STUDY:  August 13, 2005.   INDICATION FOR STUDY:  Hypersomnia with sleep apnea.   EPWORTH SLEEPINESS SCORE:  24/24.   BMI:  40.   WEIGHT:  236 pounds.   HOME MEDICATIONS:  Actos, metformin, glipizide, Norvasc, Lasix, Lotensin,  Imdur, Neurontin, Pravachol.   SLEEP ARCHITECTURE:  Total sleep time 416 minutes with sleep efficiency 87%.  Stage I 4%, stage II 66%, stages III and IV 11%, REM 18% of total sleep  time. Sleep latency 24 minutes, REM latency 154 minutes, awake after sleep  onset 39 minutes, arousal index 5.6. No bedtime medication reported.   RESPIRATORY DATA:  NPSG protocol. Apnea/hypopnea index (AHI, RDI) 3.5  obstructive events per hour which is within normal limits (0/5 per hour).  Events were not positional, and noted while supine and on right side. REM  AHI 10 per hour.   OXYGEN DATA:  Moderate intermittent snore with oxygen desaturation to a  nadir of 85%. Mean oxygen saturation through the study was 95% on room air.   CARDIAC DATA:  Normal sinus rhythm with occasional PAC.   MOVEMENT/PARASOMNIA:  Rare leg jerk, insignificant.   IMPRESSION/RECOMMENDATIONS:  Occasional sleep disordered breathing events,  apneas and hypopneas. AHI 3.5 per hour is within normal limits (0/5 per  hour) and no specific intervention is indicated. There was  moderate snoring with transient oxygen desaturation to 85% but mean oxygen  saturation was normal through the night.      Clinton D. Maple Hudson, M.D.  Diplomate, Biomedical engineer of Sleep Medicine  Electronically Signed     CDY/MEDQ  D:  08/16/2005 16:33:09  T:   08/17/2005 02:17:50  Job:  [id number]

## 2011-02-27 NOTE — Discharge Summary (Signed)
NAME:  [name] [name], [name] [name]                          ACCOUNT NO.:  [id number]   MEDICAL RECORD NO.:  [id number]                   PATIENT TYPE:  OIB   LOCATION:  5511                                 FACILITY:  [facility]   PHYSICIAN:  Arturo Morton. Riley Kill, M.D. [facility]         DATE OF BIRTH:  [birth year]   DATE OF ADMISSION:  05/07/2004  DATE OF DISCHARGE:  05/08/2004                                 DISCHARGE SUMMARY   PROCEDURES:  1. Cardiac catheterization  2. Coronary arteriogram.  3. Left ventriculogram.   HOSPITAL COURSE:  Ms. Rosiles is a 65 year old female with no known history  of coronary artery disease.  She has a history of diabetes and had some  chest pain suggestive of ischemia.  She was admitted for cardiac  catheterization and further evaluation.   Cardiac catheterization was performed on May 07, 2004, and it showed a 30%  LAD, a 40 to 50% OM, and a 20% stenosis in the RCA and 40% in the PLA with  calcification in the proximal RCA.  Her EF was normal.  Medical therapy was  felt the best option.  She was admitted overnight for monitoring.   Ms. Sigman had some trigeminy overnight, but her vital signs were stable.  Her groin was without ecchymoses or hematoma.  She is to hold her Glucophage  for 48 hours and her Lasix for 24 hours.  Ms. Morace was evaluated by Dr.  Riley Kill on May 08, 2004, and considered stable for discharge.  She is to  follow up as an outpatient.   CONDITION ON DISCHARGE:  Stable.   DISCHARGE DIAGNOSES:  1. Chest pain, no critical coronary artery disease by catheterization this     admission, medical therapy recommended.  2. Diabetes.  3. Hypertension.  4. Hyperlipidemia.  5. History of shoulder surgery.  6. Cholecystectomy.  7. History of endometrial biopsy.  8. History of urgency incontinence and hematuria, as well as nocturia.  9. History of abnormal menstrual bleeding.  10.      History of joint pain and swelling of her hands.   ACTIVITY:  No  strenuous activity for 24 hours.   WOUND CARE:  She is to clean the cath site with soap and water.   FOLLOWUP:  She is to follow up with Dr. Riley Kill next week.   DISCHARGE MEDICATIONS:  1. Norvasc 5 mg daily.  2. Aspirin 81 mg daily.  3. Lasix 120 mg daily.  4. Paxil 30 mg as prior to admission.  5. Benazepril 40 mg daily.  6. Glucophage 100 mg b.i.d. Hold for two days.  7. Glipizide 10 mg b.i.d.  8. Imdur 30 mg daily.  9. Actos 15 mg daily.  10.      Pepcid 20 mg as prior to admission.      Theodore Demark, P.A. [facility]                  USAA  Cheri Kearns, M.D. [facility]    RB/MEDQ  D:  05/08/2004  T:  05/08/2004  Job:  [id number]   cc:   Dr. Clover Mealy

## 2011-03-17 ENCOUNTER — Other Ambulatory Visit (INDEPENDENT_AMBULATORY_CARE_PROVIDER_SITE_OTHER): Payer: PRIVATE HEALTH INSURANCE | Admitting: *Deleted

## 2011-03-17 DIAGNOSIS — I2510 Atherosclerotic heart disease of native coronary artery without angina pectoris: Secondary | ICD-10-CM

## 2011-03-20 MED ORDER — ISOSORBIDE MONONITRATE ER 30 MG PO TB24: 30.0000 mg | ORAL_TABLET | Freq: Every day | ORAL | Status: DC

## 2011-03-20 NOTE — Telephone Encounter (Signed)
Done

## 2011-04-07 ENCOUNTER — Other Ambulatory Visit: Payer: Self-pay | Admitting: *Deleted

## 2011-04-08 MED ORDER — PAROXETINE HCL 30 MG PO TABS: 30.0000 mg | ORAL_TABLET | ORAL | Status: DC

## 2011-04-14 ENCOUNTER — Other Ambulatory Visit: Payer: Self-pay | Admitting: *Deleted

## 2011-04-14 MED ORDER — ACETAMINOPHEN-CODEINE #3 300-30 MG PO TABS: 1.0000 | ORAL_TABLET | Freq: Two times a day (BID) | ORAL | Status: DC | PRN

## 2011-04-14 NOTE — Telephone Encounter (Signed)
Rx approved.  Would you please call this in for her?  Thanks!  -Kristle Wesch

## 2011-04-14 NOTE — Telephone Encounter (Signed)
Tylenol #3 rx called to [facility] pharmacy.

## 2011-04-16 ENCOUNTER — Other Ambulatory Visit: Payer: Self-pay | Admitting: Internal Medicine

## 2011-04-27 ENCOUNTER — Other Ambulatory Visit (INDEPENDENT_AMBULATORY_CARE_PROVIDER_SITE_OTHER): Payer: PRIVATE HEALTH INSURANCE | Admitting: *Deleted

## 2011-04-27 DIAGNOSIS — I10 Essential (primary) hypertension: Secondary | ICD-10-CM

## 2011-04-27 DIAGNOSIS — E785 Hyperlipidemia, unspecified: Secondary | ICD-10-CM

## 2011-04-27 LAB — LIPID PANEL
Cholesterol: 186 mg/dL (ref 0–200)
HDL: 58.7 mg/dL (ref >39.00)
LDL Cholesterol: 104 mg/dL — ABNORMAL HIGH (ref 0–99)
Total CHOL/HDL Ratio: 3
Triglycerides: 117 mg/dL (ref 0.0–149.0)
VLDL: 23.4 mg/dL (ref 0.0–40.0)

## 2011-04-27 LAB — HEPATIC FUNCTION PANEL
ALT: 15 U/L (ref 0–35)
AST: 14 U/L (ref 0–37)
Albumin: 4.1 g/dL (ref 3.5–5.2)
Alkaline Phosphatase: 82 U/L (ref 39–117)
Bilirubin, Direct: 0 mg/dL (ref 0.0–0.3)
Total Bilirubin: 0.6 mg/dL (ref 0.3–1.2)
Total Protein: 7.7 g/dL (ref 6.0–8.3)

## 2011-04-30 ENCOUNTER — Telehealth: Payer: Self-pay | Admitting: Cardiology

## 2011-04-30 DIAGNOSIS — E785 Hyperlipidemia, unspecified: Secondary | ICD-10-CM

## 2011-04-30 MED ORDER — ATORVASTATIN CALCIUM 20 MG PO TABS: 20.0000 mg | ORAL_TABLET | Freq: Every day | ORAL | Status: DC

## 2011-04-30 NOTE — Telephone Encounter (Signed)
Reviewed fasting labs with patient. Will send in a new prescription for Atorvastatin 20 mg daily

## 2011-05-01 ENCOUNTER — Other Ambulatory Visit: Payer: Self-pay | Admitting: *Deleted

## 2011-05-01 MED ORDER — METFORMIN HCL 1000 MG PO TABS: 1000.0000 mg | ORAL_TABLET | Freq: Two times a day (BID) | ORAL | Status: DC

## 2011-05-08 ENCOUNTER — Other Ambulatory Visit: Payer: Self-pay | Admitting: *Deleted

## 2011-05-08 MED ORDER — "SYRINGE/NEEDLE (DISP) 23G X 3/4"" 3 ML MISC": Status: DC

## 2011-05-08 MED ORDER — CYANOCOBALAMIN 1000 MCG/ML IJ SOLN: 1000.0000 ug | INTRAMUSCULAR | Status: DC

## 2011-05-25 ENCOUNTER — Other Ambulatory Visit: Payer: Self-pay | Admitting: Internal Medicine

## 2011-05-25 DIAGNOSIS — Z1231 Encounter for screening mammogram for malignant neoplasm of breast: Secondary | ICD-10-CM

## 2011-06-02 ENCOUNTER — Other Ambulatory Visit: Payer: PRIVATE HEALTH INSURANCE | Admitting: *Deleted

## 2011-06-11 ENCOUNTER — Ambulatory Visit (HOSPITAL_COMMUNITY)
Admission: RE | Admit: 2011-06-11 | Discharge: 2011-06-11 | Disposition: A | Discharge status: Home / Self Care | Payer: PRIVATE HEALTH INSURANCE | Source: Ambulatory Visit | Attending: Internal Medicine | Admitting: Internal Medicine

## 2011-06-11 DIAGNOSIS — Z1231 Encounter for screening mammogram for malignant neoplasm of breast: Secondary | ICD-10-CM

## 2011-06-12 ENCOUNTER — Encounter: Payer: Self-pay | Admitting: Internal Medicine

## 2011-06-12 ENCOUNTER — Ambulatory Visit (INDEPENDENT_AMBULATORY_CARE_PROVIDER_SITE_OTHER): Payer: PRIVATE HEALTH INSURANCE | Admitting: Internal Medicine

## 2011-06-12 VITALS — BP 135/60 | HR 61 | Temp 97.9°F | Ht 64.0 in | Wt 220.6 lb

## 2011-06-12 DIAGNOSIS — R600 Localized edema: Secondary | ICD-10-CM

## 2011-06-12 DIAGNOSIS — E559 Vitamin D deficiency, unspecified: Secondary | ICD-10-CM

## 2011-06-12 DIAGNOSIS — M79609 Pain in unspecified limb: Secondary | ICD-10-CM

## 2011-06-12 DIAGNOSIS — E785 Hyperlipidemia, unspecified: Secondary | ICD-10-CM

## 2011-06-12 DIAGNOSIS — M79601 Pain in right arm: Secondary | ICD-10-CM

## 2011-06-12 DIAGNOSIS — I10 Essential (primary) hypertension: Secondary | ICD-10-CM

## 2011-06-12 DIAGNOSIS — M7989 Other specified soft tissue disorders: Secondary | ICD-10-CM

## 2011-06-12 DIAGNOSIS — R609 Edema, unspecified: Secondary | ICD-10-CM

## 2011-06-12 DIAGNOSIS — E119 Type 2 diabetes mellitus without complications: Secondary | ICD-10-CM

## 2011-06-12 DIAGNOSIS — M159 Polyosteoarthritis, unspecified: Secondary | ICD-10-CM

## 2011-06-12 DIAGNOSIS — M1990 Unspecified osteoarthritis, unspecified site: Secondary | ICD-10-CM

## 2011-06-12 DIAGNOSIS — M79602 Pain in left arm: Secondary | ICD-10-CM

## 2011-06-12 LAB — POCT GLYCOSYLATED HEMOGLOBIN (HGB A1C): Hemoglobin A1C: 7.9

## 2011-06-12 LAB — GLUCOSE, CAPILLARY: Glucose-Capillary: 156 mg/dL — ABNORMAL HIGH (ref 70–99)

## 2011-06-12 MED ORDER — ISOSORBIDE MONONITRATE ER 30 MG PO TB24: 30.0000 mg | ORAL_TABLET | Freq: Every day | ORAL | Status: DC

## 2011-06-12 MED ORDER — ACETAMINOPHEN-CODEINE #3 300-30 MG PO TABS: 1.0000 | ORAL_TABLET | Freq: Three times a day (TID) | ORAL | Status: DC | PRN

## 2011-06-12 MED ORDER — FERROUS SULFATE 325 (65 FE) MG PO TABS: 325.0000 mg | ORAL_TABLET | Freq: Two times a day (BID) | ORAL | Status: DC

## 2011-06-12 MED ORDER — IBUPROFEN 400 MG PO TABS: 400.0000 mg | ORAL_TABLET | Freq: Four times a day (QID) | ORAL | Status: AC | PRN

## 2011-06-12 MED ORDER — BENAZEPRIL HCL 40 MG PO TABS: 40.0000 mg | ORAL_TABLET | Freq: Every day | ORAL | Status: DC

## 2011-06-12 MED ORDER — ATENOLOL 100 MG PO TABS: 50.0000 mg | ORAL_TABLET | Freq: Every day | ORAL | Status: DC

## 2011-06-12 MED ORDER — METFORMIN HCL 1000 MG PO TABS: 1000.0000 mg | ORAL_TABLET | Freq: Two times a day (BID) | ORAL | Status: DC

## 2011-06-12 MED ORDER — PAROXETINE HCL 30 MG PO TABS: 30.0000 mg | ORAL_TABLET | ORAL | Status: DC

## 2011-06-12 MED ORDER — GLIPIZIDE 10 MG PO TABS: 20.0000 mg | ORAL_TABLET | Freq: Two times a day (BID) | ORAL | Status: DC

## 2011-06-12 MED ORDER — FUROSEMIDE 40 MG PO TABS: 40.0000 mg | ORAL_TABLET | Freq: Every day | ORAL | Status: DC

## 2011-06-12 NOTE — Patient Instructions (Signed)
Schedule a followup appointment with Dr. Arvilla Market in 3 months. Be sure to come in before then for a cholesterol check. You will need to be fasting for this; do not eat anything after midnight before your blood work. You has been prescribed ibuprofen to use for pain relief. Alternate this with your Tylenol #3 for pain relief. We will get an x-ray of your left arm today.  I will call you if the result shows any fracture or other concerning findings. your prescriptions have been sent to your [facility].

## 2011-06-16 ENCOUNTER — Ambulatory Visit (HOSPITAL_COMMUNITY)
Admission: RE | Admit: 2011-06-16 | Discharge: 2011-06-16 | Disposition: A | Discharge status: Home / Self Care | Payer: PRIVATE HEALTH INSURANCE | Source: Ambulatory Visit | Attending: Internal Medicine | Admitting: Internal Medicine

## 2011-06-16 DIAGNOSIS — S40029A Contusion of unspecified upper arm, initial encounter: Secondary | ICD-10-CM | POA: Insufficient documentation

## 2011-06-16 DIAGNOSIS — X58XXXA Exposure to other specified factors, initial encounter: Secondary | ICD-10-CM | POA: Insufficient documentation

## 2011-06-16 DIAGNOSIS — M79602 Pain in left arm: Secondary | ICD-10-CM

## 2011-06-16 DIAGNOSIS — M25539 Pain in unspecified wrist: Secondary | ICD-10-CM | POA: Insufficient documentation

## 2011-06-18 DIAGNOSIS — M7989 Other specified soft tissue disorders: Secondary | ICD-10-CM | POA: Insufficient documentation

## 2011-06-18 NOTE — Progress Notes (Signed)
  Subjective:    Patient ID: [name] [name], female    DOB: 1946/08/31, 65 y.o.   MRN: [id number]  HPI:  Pt is a 65 y/o F with [facility] outlined in the EMR.  She is here today for routine f/u of her chronic medications.  DM2:  Pt is doing well on her current regimen.  Denies hypoglycemic events or persistent hyperglycemia.  HTN:  Pt taking all meds as directed without adverse effect.  Denies h/a, vision changes, or syncope.  CAD:  Pt denies chest pain, SOB.  She follows up with Dr. Shanon Brow annually.  Please see the A&P for the status of the pt's other medical problems.     Review of Systems  Constitutional: Negative for fever, chills and fatigue.  HENT: Negative for hearing loss, congestion, rhinorrhea, sneezing, neck pain, neck stiffness and postnasal drip.   Eyes: Negative for photophobia, pain and visual disturbance.  Respiratory: Negative for choking, chest tightness, shortness of breath and wheezing.   Cardiovascular: . Negative for chest pain and palpitations.  Genitourinary: Negative for dysuria, urgency, hematuria, decreased urine volume, difficulty urinating and pelvic pain.  Musculoskeletal: Negative for joint swelling.  Neurological: Negative for dizziness, facial asymmetry, speech difficulty, numbness and headaches.       Objective:   Physical Exam  Constitutional: She is oriented to person, place, and time. She appears well-developed and well-nourished. No distress.  HENT:  Head: Normocephalic and atraumatic.  Eyes: Conjunctivae and EOM are normal. Pupils are equal, round, and reactive to light. Right eye exhibits no discharge. Left eye exhibits no discharge. No scleral icterus.  Neck: Normal range of motion. Neck supple. No JVD present. No tracheal deviation present. No thyromegaly present.  Cardiovascular: Normal rate and regular rhythm.  Exam reveals no gallop and no friction rub.   Murmur (2/6 SM) heard. Pulmonary/Chest: Effort normal and breath sounds normal. No  respiratory distress. She has no wheezes. She has no rales.  Abdominal: She exhibits no distension. There is no tenderness. There is no rebound.  Musculoskeletal: trace edema in bilateral LEs.  Painful swelling is present on left forearm with mild surrounding ecchymosis; no increased warmth or erythema.  ROM in wrist, hand, and elbow intact.  MCP joints swollen bilaterally; no warmth or erythema noted. Lymphadenopathy:    She has no cervical adenopathy.  Neurological: She is alert and oriented to person, place, and time. No cranial nerve deficit. Coordination normal.  Skin: Skin is warm and dry. No rash noted. She is not diaphoretic. No erythema.  Psychiatric: She has a normal mood and affect. Her behavior is normal. Judgment and thought content normal.          Assessment & Plan:

## 2011-06-18 NOTE — Assessment & Plan Note (Addendum)
Pt is doing well on her current regimen.  A1c is slightly above goal but this is preferable to hypoglycemia - she has had no hypoglycemic events since her last admission.  Will continue her current regimen for now.  Will perform annual foot exam today

## 2011-06-18 NOTE — Assessment & Plan Note (Signed)
Will check vitamin D level at next visit.

## 2011-06-18 NOTE — Assessment & Plan Note (Signed)
Pt denies trauma or other injury to forearm.  She is osteopenic but is otherwise without risk factors for pathological fx.  She is neurovascularly intact,  Will obtain XRay today to assess for any underlying pathology.

## 2011-06-18 NOTE — Assessment & Plan Note (Signed)
Pt reports AM stiffness and pain in her bilateral hands that improves throughout the day.  She notes she occasionally has difficulty with grip 2/2 pain.   DIP and PIP joints are relatively spared.  She has not taken any OTC meds dfor pain/sx relief.  Her sx could be OA or RA.  For now, will try OTC NSAIDS for sx management.  If her sx progress, will consider serological w/u with [facility].

## 2011-06-18 NOTE — Assessment & Plan Note (Signed)
Stable.  WIll check CMET today to evaluate electrolyte status and renal fxn.

## 2011-06-18 NOTE — Assessment & Plan Note (Signed)
Pt is tolerating lipitor well.  Will check LFTs today and have her return for fasting lipid panel.

## 2011-07-02 ENCOUNTER — Other Ambulatory Visit: Payer: Self-pay | Admitting: *Deleted

## 2011-07-13 LAB — GLUCOSE, CAPILLARY: Glucose-Capillary: 91

## 2011-07-16 ENCOUNTER — Telehealth: Payer: Self-pay | Admitting: Dietician

## 2011-07-16 NOTE — Telephone Encounter (Signed)
Needs paper signes for testing su[pplies asap is out of strips- not with doctor diabetic anymore- is with [facility]- [employer] [employer] [phone]  Fax retrieved, signed and faxed back.

## 2011-07-23 LAB — DIFFERENTIAL
Basophils Absolute: 0
Basophils Relative: 1
Eosinophils Absolute: 0
Eosinophils Relative: 0
Lymphocytes Relative: 40
Lymphs Abs: 2.1
Monocytes Absolute: 0.3
Monocytes Relative: 5
Neutro Abs: 2.8
Neutrophils Relative %: 54

## 2011-07-23 LAB — PROTIME-INR
INR: 0.9
Prothrombin Time: 12.2

## 2011-07-23 LAB — BASIC METABOLIC PANEL
BUN: 25 — ABNORMAL HIGH
CO2: 31
Calcium: 9.6
Chloride: 99
Creatinine, Ser: 0.85
GFR calc Af Amer: >60
GFR calc non Af Amer: >60
Glucose, Bld: 168 — ABNORMAL HIGH
Potassium: 4.3
Sodium: 139

## 2011-07-23 LAB — TYPE AND SCREEN
ABO/RH(D): O POS
Antibody Screen: NEGATIVE

## 2011-07-23 LAB — CBC
HCT: 35.6 — ABNORMAL LOW
Hemoglobin: 12.1
MCHC: 34.1
MCV: 90.7
Platelets: 301
RBC: 3.93
RDW: 14.5 — ABNORMAL HIGH
WBC: 5.3

## 2011-07-23 LAB — APTT: aPTT: 30

## 2011-07-30 LAB — COMPREHENSIVE METABOLIC PANEL
ALT: 13
AST: 16
Albumin: 3.3 — ABNORMAL LOW
Alkaline Phosphatase: 74
BUN: 19
CO2: 28
Calcium: 8.8
Chloride: 101
Creatinine, Ser: 0.7
GFR calc Af Amer: >60
GFR calc non Af Amer: >60
Glucose, Bld: 155 — ABNORMAL HIGH
Potassium: 3.3 — ABNORMAL LOW
Sodium: 138
Total Bilirubin: 0.5
Total Protein: 6.5

## 2011-07-30 LAB — TSH: TSH: 1.577

## 2011-07-30 LAB — CBC
HCT: 33.9 — ABNORMAL LOW
Hemoglobin: 11.4 — ABNORMAL LOW
MCHC: 33.7
MCV: 90.9
Platelets: 272
RBC: 3.73 — ABNORMAL LOW
RDW: 15.6 — ABNORMAL HIGH
WBC: 5.7

## 2011-07-30 LAB — CARDIAC PANEL(CRET KIN+CKTOT+MB+TROPI)
CK, MB: 1.3
CK, MB: 1.4
Relative Index: INVALID
Relative Index: INVALID
Total CK: 76
Total CK: 85
Troponin I: 0.01
Troponin I: 0.01

## 2011-07-30 LAB — IRON AND TIBC
Iron: 61
Saturation Ratios: 19 — ABNORMAL LOW
TIBC: 320
UIBC: 259

## 2011-07-30 LAB — B-NATRIURETIC PEPTIDE (CONVERTED LAB): Pro B Natriuretic peptide (BNP): <30

## 2011-07-30 LAB — HEMOGLOBIN A1C: Hgb A1c MFr Bld: 8.4 — ABNORMAL HIGH

## 2011-07-30 LAB — FOLATE: Folate: 7.9

## 2011-07-30 LAB — DIFFERENTIAL
Basophils Absolute: 0
Basophils Relative: 1
Eosinophils Absolute: 0
Eosinophils Relative: 0
Lymphocytes Relative: 50 — ABNORMAL HIGH
Lymphs Abs: 2.9
Monocytes Absolute: 0.3
Monocytes Relative: 6
Neutro Abs: 2.5
Neutrophils Relative %: 44

## 2011-07-30 LAB — FOLATE RBC: RBC Folate: 369

## 2011-07-30 LAB — PROTIME-INR
INR: 0.9
Prothrombin Time: 12.2

## 2011-07-30 LAB — RETICULOCYTES
RBC.: 3.85 — ABNORMAL LOW
Retic Count, Absolute: 38.5
Retic Ct Pct: 1

## 2011-07-30 LAB — VITAMIN B12: Vitamin B-12: 474 (ref 211–911)

## 2011-07-30 LAB — APTT: aPTT: 33

## 2011-07-30 LAB — MAGNESIUM: Magnesium: 1.4 — ABNORMAL LOW

## 2011-07-30 LAB — FERRITIN: Ferritin: 29 (ref 10–291)

## 2011-08-12 ENCOUNTER — Other Ambulatory Visit: Payer: Self-pay | Admitting: *Deleted

## 2011-08-13 MED ORDER — INSULIN GLARGINE 100 UNIT/ML ~~LOC~~ SOLN: 5.0000 [IU] | Freq: Every day | SUBCUTANEOUS | Status: DC

## 2011-08-25 ENCOUNTER — Ambulatory Visit: Payer: PRIVATE HEALTH INSURANCE

## 2011-08-25 ENCOUNTER — Other Ambulatory Visit: Payer: PRIVATE HEALTH INSURANCE

## 2011-08-25 ENCOUNTER — Other Ambulatory Visit (INDEPENDENT_AMBULATORY_CARE_PROVIDER_SITE_OTHER): Payer: PRIVATE HEALTH INSURANCE

## 2011-08-25 DIAGNOSIS — Z23 Encounter for immunization: Secondary | ICD-10-CM

## 2011-08-25 DIAGNOSIS — E785 Hyperlipidemia, unspecified: Secondary | ICD-10-CM

## 2011-08-25 DIAGNOSIS — E119 Type 2 diabetes mellitus without complications: Secondary | ICD-10-CM

## 2011-08-25 LAB — COMPREHENSIVE METABOLIC PANEL
ALT: 12 U/L (ref 0–35)
AST: 12 U/L (ref 0–37)
Albumin: 4.1 g/dL (ref 3.5–5.2)
Alkaline Phosphatase: 81 U/L (ref 39–117)
BUN: 15 mg/dL (ref 6–23)
CO2: 32 mEq/L (ref 19–32)
Calcium: 9.3 mg/dL (ref 8.4–10.5)
Chloride: 103 mEq/L (ref 96–112)
Creat: 0.64 mg/dL (ref 0.50–1.10)
Glucose, Bld: 154 mg/dL — ABNORMAL HIGH (ref 70–99)
Potassium: 4.9 mEq/L (ref 3.5–5.3)
Sodium: 142 mEq/L (ref 135–145)
Total Bilirubin: 0.5 mg/dL (ref 0.3–1.2)
Total Protein: 6.9 g/dL (ref 6.0–8.3)

## 2011-08-25 LAB — LIPID PANEL
Cholesterol: 127 mg/dL (ref 0–200)
HDL: 50 mg/dL (ref >39)
LDL Cholesterol: 69 mg/dL (ref 0–99)
Total CHOL/HDL Ratio: 2.5 Ratio
Triglycerides: 41 mg/dL (ref <150)
VLDL: 8 mg/dL (ref 0–40)

## 2011-10-08 ENCOUNTER — Other Ambulatory Visit: Payer: Self-pay | Admitting: *Deleted

## 2011-10-08 DIAGNOSIS — I10 Essential (primary) hypertension: Secondary | ICD-10-CM

## 2011-10-08 DIAGNOSIS — R609 Edema, unspecified: Secondary | ICD-10-CM

## 2011-10-09 MED ORDER — FUROSEMIDE 40 MG PO TABS: 40.0000 mg | ORAL_TABLET | Freq: Every day | ORAL | Status: DC

## 2011-10-15 ENCOUNTER — Ambulatory Visit (INDEPENDENT_AMBULATORY_CARE_PROVIDER_SITE_OTHER): Payer: PRIVATE HEALTH INSURANCE | Admitting: Internal Medicine

## 2011-10-15 ENCOUNTER — Encounter: Payer: Self-pay | Admitting: Internal Medicine

## 2011-10-15 VITALS — BP 161/66 | HR 66 | Temp 98.2°F | Ht 64.0 in | Wt 221.6 lb

## 2011-10-15 DIAGNOSIS — F329 Major depressive disorder, single episode, unspecified: Secondary | ICD-10-CM

## 2011-10-15 DIAGNOSIS — I10 Essential (primary) hypertension: Secondary | ICD-10-CM

## 2011-10-15 DIAGNOSIS — M159 Polyosteoarthritis, unspecified: Secondary | ICD-10-CM

## 2011-10-15 DIAGNOSIS — F32A Depression, unspecified: Secondary | ICD-10-CM

## 2011-10-15 DIAGNOSIS — M1990 Unspecified osteoarthritis, unspecified site: Secondary | ICD-10-CM

## 2011-10-15 DIAGNOSIS — M79609 Pain in unspecified limb: Secondary | ICD-10-CM

## 2011-10-15 DIAGNOSIS — M79602 Pain in left arm: Secondary | ICD-10-CM

## 2011-10-15 DIAGNOSIS — E119 Type 2 diabetes mellitus without complications: Secondary | ICD-10-CM

## 2011-10-15 LAB — POCT GLYCOSYLATED HEMOGLOBIN (HGB A1C): Hemoglobin A1C: 9.8

## 2011-10-15 LAB — GLUCOSE, CAPILLARY: Glucose-Capillary: 162 mg/dL — ABNORMAL HIGH (ref 70–99)

## 2011-10-15 MED ORDER — ACETAMINOPHEN-CODEINE #3 300-30 MG PO TABS: 1.0000 | ORAL_TABLET | Freq: Three times a day (TID) | ORAL | Status: DC | PRN

## 2011-10-15 MED ORDER — PAROXETINE HCL 40 MG PO TABS: 40.0000 mg | ORAL_TABLET | ORAL | Status: DC

## 2011-10-15 MED ORDER — INSULIN GLARGINE 100 UNIT/ML ~~LOC~~ SOLN: 10.0000 [IU] | Freq: Every day | SUBCUTANEOUS | Status: DC

## 2011-10-15 NOTE — Progress Notes (Signed)
  Subjective:    Patient ID: [name] [name], female    DOB: 10/17/1945, 66 y.o.   MRN: [id number]  HPI:  Pt is a 66 y/o F with OMH outlined in the EMR.  She is here today for routine f/u of her chronic medical issues.  DM2:  Pt is doing well on her current regimen.  Denies hypoglycemic events but notes increased CBGS since her last visit.  Review of meter reveals ave cbg 181 with highs in the upper 200s.  Pt denies any changes in diet or exercise levels.    HTN:  Pt taking all meds as directed without adverse effect.  Denies h/a, vision changes, or syncope.  CAD:  Pt denies chest pain, SOB.  She follows up with Dr. Shanon Brow annually.  Arthritis: pt reports continued arthritic pain in bilateral wrists. Her pain is well controlled with the tylenol number three.  She also notes some difficulty walking 2/2 arthritis pain and a "weak back."  She notes her back starts to hurt after standing for awhile.  She follows with Dr. Dewaine Conger for steroid injections in her back; she has an appt with him on Jan 20.  She states she is also willing to do PT.   Please see the A&P for the status of the pt's other medical problems.   Review of Systems  Constitutional: Negative for fever, chills and fatigue.  HENT: Negative for hearing loss, congestion, rhinorrhea, sneezing, neck pain, neck stiffness and postnasal drip.   Eyes: Negative for photophobia, pain and visual disturbance.  Respiratory: Negative for choking, chest tightness, shortness of breath and wheezing.   Cardiovascular: . Negative for chest pain and palpitations.  Genitourinary: Negative for dysuria, urgency, hematuria, decreased urine volume, difficulty urinating and pelvic pain.  Musculoskeletal: Negative for joint swelling.  Neurological: Negative for dizziness, facial asymmetry, speech difficulty, numbness and headaches.       Objective:   Physical Exam  Constitutional: She is oriented to person, place, and time. She appears well-developed and  well-nourished. No distress.  HENT:  Head: Normocephalic and atraumatic.  Eyes: Conjunctivae and EOM are normal. Pupils are equal, round, and reactive to light. Right eye exhibits no discharge. Left eye exhibits no discharge. No scleral icterus.  Neck: Normal range of motion. Neck supple. No JVD present. No tracheal deviation present. No thyromegaly present.  Cardiovascular: Normal rate and regular rhythm.  Exam reveals no gallop and no friction rub.   Murmur (2/6 SM) heard. Pulmonary/Chest: Effort normal and breath sounds normal. No respiratory distress. She has no wheezes. She has no rales.  Abdominal: She exhibits no distension. There is no tenderness. There is no rebound.  Musculoskeletal: trace edema in bilateral LEs.  Painful swelling is present on left forearm with mild surrounding ecchymosis; no increased warmth or erythema.  ROM in wrist, hand, and elbow intact.  MCP joints swollen bilaterally; no warmth or erythema noted. Lymphadenopathy:    She has no cervical adenopathy.  Neurological: She is alert and oriented to person, place, and time. No cranial nerve deficit. Coordination normal.  Skin: Skin is warm and dry. No rash noted. She is not diaphoretic. No erythema.  Psychiatric: She has a normal mood and affect. Her behavior is normal. Judgment and thought content normal.          Assessment & Plan:   HPI    Review of Systems   Physical Exam

## 2011-10-15 NOTE — Assessment & Plan Note (Signed)
Will check a magnesium level today. If it is normal will consider stopping oral supplementation with followup lab testing in 3 months

## 2011-10-15 NOTE — Patient Instructions (Signed)
Schedule a followup appointment with Dr. Arvilla Market in February. Stop taking glipizide. This is the medicine floor at blood sugar that you take 2 pills with meals. Increase your insulin to 10 units at night. If you are have any blood sugars below 70 call the clinic at [phone]. Keep taking all of your other medications as directed. I will call you about the results of your magnesium level We will refer you to physical therapy.
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
## 2011-10-15 NOTE — Assessment & Plan Note (Addendum)
A1c is elevated at 9.8.  Will increase insulin to 10u qhs and d/c glipizide as there is little benefit to continuing glipizide.  Will have her return in 1 mo for review of CBGs and to assess need to change insulin regimen.  Patient is advised to call the clinic if she experiences any hypoglycemic symptoms or. blood sugars below 70.

## 2011-10-15 NOTE — Assessment & Plan Note (Signed)
Patient continues to experience arthritic pain program involving her hands, right shoulder, and low back. Additionally she has some difficulty walking as a result of this discomfort. Advised her to keep her appointment with Dr. Dewaine Conger for steroid injections. We'll also refer her to physical therapy today to help her gain strength and improve her ability to ambulate.  Her pain is currently well controlled with Tylenol No. 3; we'll continue this medication for now.

## 2011-10-15 NOTE — Assessment & Plan Note (Signed)
Blood pressure slightly elevated above goal today. She is usually within acceptable limits at her office visits. Her blood pressure may be slightly elevated as she is experiencing some acute worsening of her arthritic pain. Will not make any changes to her medication regimen at this time and will followup her blood pressure her next office visit

## 2011-10-16 ENCOUNTER — Telehealth: Payer: Self-pay | Admitting: Internal Medicine

## 2011-10-16 LAB — MAGNESIUM: Magnesium: 1.2 mg/dL — ABNORMAL LOW (ref 1.5–2.5)

## 2011-10-16 MED ORDER — MAGNESIUM CHLORIDE ER 535 (64 MG) MG PO TBCR: 1.0000 | EXTENDED_RELEASE_TABLET | Freq: Two times a day (BID) | ORAL | Status: DC

## 2011-10-16 NOTE — Telephone Encounter (Signed)
Pt called and notified about low Mag of 1.2.  She is advised to continue taking magnesium supplementation. When asked, she states in to refill of this. We will submit a refill today and plan for repeat magnesium level on her next office visit.

## 2011-10-16 NOTE — Telephone Encounter (Signed)
Called to pharm 

## 2011-10-28 ENCOUNTER — Ambulatory Visit
Payer: PRIVATE HEALTH INSURANCE | Attending: Internal Medicine | Admitting: Rehabilitative and Restorative Service Providers"

## 2011-10-28 DIAGNOSIS — IMO0001 Reserved for inherently not codable concepts without codable children: Secondary | ICD-10-CM | POA: Insufficient documentation

## 2011-10-28 DIAGNOSIS — M545 Low back pain, unspecified: Secondary | ICD-10-CM | POA: Insufficient documentation

## 2011-10-28 DIAGNOSIS — M2550 Pain in unspecified joint: Secondary | ICD-10-CM | POA: Insufficient documentation

## 2011-11-03 ENCOUNTER — Ambulatory Visit: Payer: PRIVATE HEALTH INSURANCE | Admitting: Physical Therapy

## 2011-11-03 ENCOUNTER — Telehealth: Payer: Self-pay | Admitting: Dietician

## 2011-11-03 NOTE — Telephone Encounter (Signed)
Patient called upset about not having any test strips. CDE Called [facility]  As they are the preferred provided for [employer]:  [phone], they do not have auto-refills and do not call patient- patient must call them . I called patient and explained this and advised her to say no to all to her companies. CDE will leave 50 accu chek aviva strips up front for patient to get her through until she gets her supplies.

## 2011-11-05 ENCOUNTER — Ambulatory Visit: Payer: PRIVATE HEALTH INSURANCE | Admitting: Physical Therapy

## 2011-11-05 ENCOUNTER — Other Ambulatory Visit: Payer: Self-pay | Admitting: *Deleted

## 2011-11-09 MED ORDER — ISOSORBIDE MONONITRATE ER 30 MG PO TB24: 30.0000 mg | ORAL_TABLET | Freq: Every day | ORAL | Status: DC

## 2011-11-10 ENCOUNTER — Ambulatory Visit: Payer: PRIVATE HEALTH INSURANCE | Admitting: Rehabilitative and Restorative Service Providers"

## 2011-11-12 ENCOUNTER — Ambulatory Visit: Payer: PRIVATE HEALTH INSURANCE | Admitting: Rehabilitative and Restorative Service Providers"

## 2011-11-12 NOTE — Telephone Encounter (Signed)
Opened in error

## 2011-11-13 ENCOUNTER — Encounter: Payer: Self-pay | Admitting: Internal Medicine

## 2011-11-13 ENCOUNTER — Ambulatory Visit (INDEPENDENT_AMBULATORY_CARE_PROVIDER_SITE_OTHER): Payer: PRIVATE HEALTH INSURANCE | Admitting: Internal Medicine

## 2011-11-13 VITALS — BP 148/68 | HR 69 | Temp 97.2°F | Ht 64.0 in | Wt 221.8 lb

## 2011-11-13 DIAGNOSIS — M79602 Pain in left arm: Secondary | ICD-10-CM

## 2011-11-13 DIAGNOSIS — E119 Type 2 diabetes mellitus without complications: Secondary | ICD-10-CM

## 2011-11-13 DIAGNOSIS — Z8669 Personal history of other diseases of the nervous system and sense organs: Secondary | ICD-10-CM

## 2011-11-13 DIAGNOSIS — I10 Essential (primary) hypertension: Secondary | ICD-10-CM

## 2011-11-13 DIAGNOSIS — M1990 Unspecified osteoarthritis, unspecified site: Secondary | ICD-10-CM

## 2011-11-13 DIAGNOSIS — G4700 Insomnia, unspecified: Secondary | ICD-10-CM

## 2011-11-13 DIAGNOSIS — M159 Polyosteoarthritis, unspecified: Secondary | ICD-10-CM

## 2011-11-13 DIAGNOSIS — F32A Depression, unspecified: Secondary | ICD-10-CM

## 2011-11-13 DIAGNOSIS — F329 Major depressive disorder, single episode, unspecified: Secondary | ICD-10-CM

## 2011-11-13 DIAGNOSIS — F3289 Other specified depressive episodes: Secondary | ICD-10-CM

## 2011-11-13 LAB — GLUCOSE, CAPILLARY: Glucose-Capillary: 127 mg/dL — ABNORMAL HIGH (ref 70–99)

## 2011-11-13 MED ORDER — INSULIN GLARGINE 100 UNIT/ML ~~LOC~~ SOLN: 15.0000 [IU] | Freq: Every day | SUBCUTANEOUS | Status: DC

## 2011-11-13 MED ORDER — ZOLPIDEM TARTRATE ER 6.25 MG PO TBCR: 6.2500 mg | EXTENDED_RELEASE_TABLET | Freq: Every evening | ORAL | Status: DC | PRN

## 2011-11-13 NOTE — Patient Instructions (Signed)
Schedule followup appointment with Dr. Arvilla Market in March. Increase your insulin to 15 units once daily.  If your blood sugars are still elevated you may increase your insulin by an additional 5 units.  If your sugars are still elevated after the second increase in  insulin, call the clinic to discuss further management of your diabetes. Ambien as a new medicine to help improve your sleep.  Use as directed.  Do not take this medicine until you are ready to go to bed. We will refer you for a sleep study to see if sleep apnea is causing you to not sleep well.

## 2011-11-16 ENCOUNTER — Ambulatory Visit: Payer: PRIVATE HEALTH INSURANCE | Attending: Internal Medicine | Admitting: Physical Therapy

## 2011-11-16 DIAGNOSIS — M545 Low back pain, unspecified: Secondary | ICD-10-CM | POA: Insufficient documentation

## 2011-11-16 DIAGNOSIS — IMO0001 Reserved for inherently not codable concepts without codable children: Secondary | ICD-10-CM | POA: Insufficient documentation

## 2011-11-16 DIAGNOSIS — M2550 Pain in unspecified joint: Secondary | ICD-10-CM | POA: Insufficient documentation

## 2011-11-18 ENCOUNTER — Ambulatory Visit: Payer: PRIVATE HEALTH INSURANCE | Admitting: Physical Therapy

## 2011-11-20 ENCOUNTER — Encounter: Payer: PRIVATE HEALTH INSURANCE | Admitting: Internal Medicine

## 2011-11-24 ENCOUNTER — Ambulatory Visit: Payer: PRIVATE HEALTH INSURANCE | Admitting: Physical Therapy

## 2011-11-26 ENCOUNTER — Ambulatory Visit: Payer: PRIVATE HEALTH INSURANCE | Admitting: Physical Therapy

## 2011-11-26 ENCOUNTER — Other Ambulatory Visit: Payer: Self-pay | Admitting: Cardiology

## 2011-11-30 ENCOUNTER — Ambulatory Visit: Payer: PRIVATE HEALTH INSURANCE | Admitting: Physical Therapy

## 2011-12-01 ENCOUNTER — Encounter (HOSPITAL_BASED_OUTPATIENT_CLINIC_OR_DEPARTMENT_OTHER): Payer: PRIVATE HEALTH INSURANCE

## 2011-12-02 ENCOUNTER — Ambulatory Visit: Payer: PRIVATE HEALTH INSURANCE | Admitting: Physical Therapy

## 2011-12-08 ENCOUNTER — Ambulatory Visit: Payer: PRIVATE HEALTH INSURANCE | Admitting: Physical Therapy

## 2011-12-09 ENCOUNTER — Ambulatory Visit: Payer: PRIVATE HEALTH INSURANCE | Admitting: Rehabilitative and Restorative Service Providers"

## 2011-12-14 ENCOUNTER — Ambulatory Visit: Payer: PRIVATE HEALTH INSURANCE | Attending: Internal Medicine | Admitting: Physical Therapy

## 2011-12-14 DIAGNOSIS — IMO0001 Reserved for inherently not codable concepts without codable children: Secondary | ICD-10-CM | POA: Insufficient documentation

## 2011-12-14 DIAGNOSIS — M545 Low back pain, unspecified: Secondary | ICD-10-CM | POA: Insufficient documentation

## 2011-12-14 DIAGNOSIS — M2550 Pain in unspecified joint: Secondary | ICD-10-CM | POA: Insufficient documentation

## 2011-12-16 ENCOUNTER — Ambulatory Visit: Payer: PRIVATE HEALTH INSURANCE | Admitting: Rehabilitative and Restorative Service Providers"

## 2011-12-21 ENCOUNTER — Ambulatory Visit: Payer: PRIVATE HEALTH INSURANCE | Admitting: Rehabilitative and Restorative Service Providers"

## 2011-12-22 ENCOUNTER — Encounter: Payer: Self-pay | Admitting: Internal Medicine

## 2011-12-22 ENCOUNTER — Ambulatory Visit (INDEPENDENT_AMBULATORY_CARE_PROVIDER_SITE_OTHER): Payer: PRIVATE HEALTH INSURANCE | Admitting: Internal Medicine

## 2011-12-22 VITALS — BP 153/71 | HR 81 | Temp 97.2°F | Ht 64.0 in | Wt 229.7 lb

## 2011-12-22 DIAGNOSIS — D509 Iron deficiency anemia, unspecified: Secondary | ICD-10-CM

## 2011-12-22 DIAGNOSIS — E119 Type 2 diabetes mellitus without complications: Secondary | ICD-10-CM

## 2011-12-22 DIAGNOSIS — E559 Vitamin D deficiency, unspecified: Secondary | ICD-10-CM

## 2011-12-22 DIAGNOSIS — G4700 Insomnia, unspecified: Secondary | ICD-10-CM | POA: Insufficient documentation

## 2011-12-22 DIAGNOSIS — I10 Essential (primary) hypertension: Secondary | ICD-10-CM

## 2011-12-22 DIAGNOSIS — E785 Hyperlipidemia, unspecified: Secondary | ICD-10-CM

## 2011-12-22 DIAGNOSIS — D649 Anemia, unspecified: Secondary | ICD-10-CM

## 2011-12-22 LAB — CBC
HCT: 36.3 % (ref 36.0–46.0)
Hemoglobin: 12.4 g/dL (ref 12.0–15.0)
MCH: 29.3 pg (ref 26.0–34.0)
MCHC: 34.2 g/dL (ref 30.0–36.0)
MCV: 85.8 fL (ref 78.0–100.0)
Platelets: 337 10*3/uL (ref 150–400)
RBC: 4.23 MIL/uL (ref 3.87–5.11)
RDW: 15.3 % (ref 11.5–15.5)
WBC: 5.9 10*3/uL (ref 4.0–10.5)

## 2011-12-22 LAB — LIPID PANEL
Cholesterol: 138 mg/dL (ref 0–200)
HDL: 64 mg/dL (ref >39)
LDL Cholesterol: 64 mg/dL (ref 0–99)
Total CHOL/HDL Ratio: 2.2 Ratio
Triglycerides: 51 mg/dL (ref <150)
VLDL: 10 mg/dL (ref 0–40)

## 2011-12-22 LAB — COMPREHENSIVE METABOLIC PANEL
ALT: 16 U/L (ref 0–35)
AST: 18 U/L (ref 0–37)
Albumin: 4.1 g/dL (ref 3.5–5.2)
Alkaline Phosphatase: 89 U/L (ref 39–117)
BUN: 15 mg/dL (ref 6–23)
CO2: 29 mEq/L (ref 19–32)
Calcium: 9 mg/dL (ref 8.4–10.5)
Chloride: 102 mEq/L (ref 96–112)
Creat: 0.57 mg/dL (ref 0.50–1.10)
Glucose, Bld: 80 mg/dL (ref 70–99)
Potassium: 3.8 mEq/L (ref 3.5–5.3)
Sodium: 141 mEq/L (ref 135–145)
Total Bilirubin: 0.6 mg/dL (ref 0.3–1.2)
Total Protein: 7.3 g/dL (ref 6.0–8.3)

## 2011-12-22 LAB — GLUCOSE, CAPILLARY: Glucose-Capillary: 89 mg/dL (ref 70–99)

## 2011-12-22 LAB — MAGNESIUM: Magnesium: 1.3 mg/dL — ABNORMAL LOW (ref 1.5–2.5)

## 2011-12-22 NOTE — Patient Instructions (Signed)
Schedule a follow up appointment with Dr. Arvilla Market in May or sooner if needed. Keep taking your medications as directed. I will call you to the discuss the results of your sleep study as soon as I get them.

## 2011-12-22 NOTE — Assessment & Plan Note (Signed)
Will check a fasting lipid panel today as well as liver function tests. Patient is tolerating her statin well without adverse effect.

## 2011-12-22 NOTE — Assessment & Plan Note (Signed)
Will check magnesium level today and replete if indicated

## 2011-12-22 NOTE — Assessment & Plan Note (Signed)
CBGs have improved following recent increase in Lantus.   Reviewed symptoms of hypoglycemia through the importance of checking CBGs after treatment to ensure normalization of blood glucose. Patient expresses understanding.  Will check hemoglobin A1c today as well as urine microalbumin creatinine ratio.  Will continue her current regimen.  Patient is advised to contact the clinic if she experiences additional instances of hypoglycemia.  Will followup with her in May.

## 2011-12-22 NOTE — Assessment & Plan Note (Signed)
Will check CBC today.  Patient is without symptoms of bright red blood per rectum, dark tarry stools, hematemesis, or other abnormal bleeding.

## 2011-12-22 NOTE — Assessment & Plan Note (Signed)
Blood pressure is slightly above goal today. May need to adjust her antihypertensive regimen. If her blood pressure remains elevated at her next visit, will adjust her antihypertensive accordingly.  Will check comprehensive metabolic panel today to assess electrolytes and kidney function.

## 2011-12-22 NOTE — Assessment & Plan Note (Signed)
Will check vitamin D level today and weekly if indicated

## 2011-12-22 NOTE — Assessment & Plan Note (Signed)
Patient's symptoms are improved following initiation of Ambien; she is able to follow sleep easier however continues to wake up during the night.  She has a split night sleep study scheduled for Thursday, 12/24/2011 to assess for underlying sleep apnea.  Will followup these results and order equipment if indicated.  If there is no evidence of sleep apnea, will change to extended-release Ambien for improved from management of her insomnia

## 2011-12-22 NOTE — Progress Notes (Signed)
Subjective:     Patient ID: [name] [name], female   DOB: 06-08-1946, 66 y.o.   MRN: [id number]  HPI  Pt here for 1 mo f/u visit  1. Insomnia: Ambien is improving sleep however she states she still wakes up in the middle of the night.  She will often wake up between 2am and 4am and then has a difficult time returning to sleep.  She frequently goes to sleep at 8 pm and is able to fall asleep well.     2.  Diabetes: Review of glucometer reveals average CBG of135.  She had one low of 67 at 12:33pm.   She was asymptomatic and states she did not miss a meal.  She was in a PT session at the time and thinks her CBG was low because of her increased PT.  She had some juice with a graham cracked and peanut butter with normalization of her CBG. She is currently using 15u of Lantus.  Review of Systems Review of Systems  Constitutional: Negative for fever, chills, diaphoresis, activity change, appetite change, fatigue and unexpected weight change.  HENT: Negative for hearing loss, congestion and neck stiffness.   Eyes: Negative for photophobia, pain and visual disturbance.  Respiratory: Negative for cough, chest tightness, shortness of breath and wheezing.   Cardiovascular: Negative for chest pain and palpitations.  Gastrointestinal: Negative for abdominal pain, blood in stool and anal bleeding.  Genitourinary: Negative for dysuria, hematuria and difficulty urinating.  Musculoskeletal: Negative for joint swelling.  Neurological: Negative for dizziness, syncope, speech difficulty, weakness, numbness and headaches.      Objective:   Physical Exam GEN: No apparent distress.  Alert and oriented x 3.  Pleasant, conversant, and cooperative to exam. HEENT: head is autraumatic and normocephalic.  Neck is supple without palpable masses or lymphadenopathy.  Vision intact.  EOMI.  PERRLA.  Sclerae anicteric.  Conjunctivae without pallor or injection. Mucous membranes are moist.  Oropharynx is without erythema,  exudates, or other abnormal lesions.   RESP:  Lungs are clear to ascultation bilaterally with good air movement.  No wheezes, ronchi, or rubs. CARDIOVASCULAR: regular rate, normal rhythm.  Clear S1, S2, 2/6 systolic murmur, gallops, or rubs. EXT: warm and dry. No clubbing or cyanosis.  Trace edema in bilateral lower extremities. SKIN: warm and dry with normal turgor.  No rashes or abnormal lesions observed.     Assessment:

## 2011-12-23 ENCOUNTER — Telehealth: Payer: Self-pay | Admitting: Internal Medicine

## 2011-12-23 ENCOUNTER — Encounter: Payer: PRIVATE HEALTH INSURANCE | Admitting: Rehabilitative and Restorative Service Providers"

## 2011-12-23 LAB — MICROALBUMIN / CREATININE URINE RATIO
Creatinine, Urine: 84.3 mg/dL
Microalb Creat Ratio: 7.7 mg/g (ref 0.0–30.0)
Microalb, Ur: 0.65 mg/dL (ref 0.00–1.89)

## 2011-12-23 LAB — HEMOGLOBIN A1C
Hgb A1c MFr Bld: 7.1 % — ABNORMAL HIGH (ref <5.7)
Mean Plasma Glucose: 157 mg/dL — ABNORMAL HIGH (ref <117)

## 2011-12-23 LAB — VITAMIN D 25 HYDROXY (VIT D DEFICIENCY, FRACTURES): Vit D, 25-Hydroxy: 17 ng/mL — ABNORMAL LOW (ref 30–89)

## 2011-12-23 MED ORDER — ERGOCALCIFEROL 1.25 MG (50000 UT) PO CAPS: 50000.0000 [IU] | ORAL_CAPSULE | ORAL | Status: DC

## 2011-12-23 NOTE — Telephone Encounter (Signed)
Called pt to inform her of low vitamin D level (17) and need to replacement therapy.  Patient states she does not have any questions and is agreeable to treatment.  Advised her to continue with daily vitamin D supplementation of 800U daily after she completes 5 week course of 50,000 units.  Pt states she understands and agrees.  Will send in rx.  Plan to repeat vit D at her f/u visit in May

## 2011-12-23 NOTE — Progress Notes (Signed)
Patient ID: [name] [name], female   DOB: 1946/04/25, 66 y.o.   MRN: [id number]  Subjective:   HPI: Please see the A&P for the status of the pt's other medical problems.   Review of Systems  Constitutional: Negative for fever, chills and fatigue.  HENT: Negative for hearing loss, congestion, rhinorrhea, sneezing, neck pain, neck stiffness and postnasal drip.   Eyes: Negative for photophobia, pain and visual disturbance.  Respiratory: Negative for choking, chest tightness, shortness of breath and wheezing.   Cardiovascular: . Negative for chest pain and palpitations.  Genitourinary: Negative for dysuria, urgency, hematuria, decreased urine volume, difficulty urinating and pelvic pain.  Musculoskeletal: Negative for joint swelling.  Neurological: Negative for dizziness, facial asymmetry, speech difficulty, numbness and headaches.       Objective:   Physical Exam  Constitutional: She is oriented to person, place, and time. She appears well-developed and well-nourished. No distress.  HENT:  Head: Normocephalic and atraumatic.  Eyes: Conjunctivae and EOM are normal. Pupils are equal, round, and reactive to light. Right eye exhibits no discharge. Left eye exhibits no discharge. No scleral icterus.  Neck: Normal range of motion. Neck supple. No JVD present. No tracheal deviation present. No thyromegaly present.  Cardiovascular: Normal rate and regular rhythm.  Exam reveals no gallop and no friction rub.   Murmur (2/6 SM) heard. Pulmonary/Chest: Effort normal and breath sounds normal. No respiratory distress. She has no wheezes. She has no rales.  Abdominal: She exhibits no distension. There is no tenderness. There is no rebound.  Musculoskeletal: trace edema in bilateral LEs.  Painful swelling is present on left forearm with mild surrounding ecchymosis; no increased warmth or erythema.  ROM in wrist, hand, and elbow intact.  MCP joints swollen bilaterally; no warmth or erythema  noted. Lymphadenopathy:    She has no cervical adenopathy.  Neurological: She is alert and oriented to person, place, and time. No cranial nerve deficit. Coordination normal.  Skin: Skin is warm and dry. No rash noted. She is not diaphoretic. No erythema.  Psychiatric: She has a normal mood and affect. Her behavior is normal. Judgment and thought content normal.

## 2011-12-23 NOTE — Assessment & Plan Note (Signed)
Pt reports elevated CBG in the upper 200s, low 300s.  She denies any hypoglycemia.  Will increase her Lantus to 15u qhs and have her f/u in march for review of CBGs.  She is advised that she may increase an additional 5 units for a total of 20u qhs if her CBGS are still elevated > 200 while using 15u.  Patient expresses understanding and agreement with this plan.

## 2011-12-23 NOTE — Assessment & Plan Note (Signed)
Pt reports difficulty falling and staying asleep.  Will try Ambien for symptomatic relief.  WIll also send her for split night sleep study to evaluate for OSA/OHS given her risk factors.  Pt advised to call clinic if she experiences any adverse effects to Ambien.

## 2011-12-24 ENCOUNTER — Ambulatory Visit (HOSPITAL_BASED_OUTPATIENT_CLINIC_OR_DEPARTMENT_OTHER): Payer: PRIVATE HEALTH INSURANCE | Attending: Internal Medicine

## 2011-12-24 ENCOUNTER — Encounter: Payer: Self-pay | Admitting: *Deleted

## 2011-12-24 VITALS — Ht 60.0 in | Wt 230.0 lb

## 2011-12-24 DIAGNOSIS — G4710 Hypersomnia, unspecified: Secondary | ICD-10-CM | POA: Insufficient documentation

## 2011-12-24 DIAGNOSIS — G4730 Sleep apnea, unspecified: Secondary | ICD-10-CM | POA: Insufficient documentation

## 2011-12-24 DIAGNOSIS — I491 Atrial premature depolarization: Secondary | ICD-10-CM | POA: Insufficient documentation

## 2011-12-24 DIAGNOSIS — G4733 Obstructive sleep apnea (adult) (pediatric): Secondary | ICD-10-CM

## 2011-12-26 DIAGNOSIS — G4730 Sleep apnea, unspecified: Secondary | ICD-10-CM

## 2011-12-26 DIAGNOSIS — I491 Atrial premature depolarization: Secondary | ICD-10-CM

## 2011-12-26 DIAGNOSIS — G4710 Hypersomnia, unspecified: Secondary | ICD-10-CM

## 2011-12-26 NOTE — Procedures (Signed)
[name], [name]                ACCOUNT NO.:  [id number]  MEDICAL RECORD NO.:  [id number]          PATIENT TYPE:  OUT  LOCATION:  SLEEP CENTER                 FACILITY:  [facility]  PHYSICIAN:  Jagjit Riner D. Maple Hudson, MD, FCCP, FACPDATE OF BIRTH:  Nov 29, 1945  DATE OF STUDY:  12/24/2011                           NOCTURNAL POLYSOMNOGRAM  REFERRING PHYSICIAN:  Nelda Bucks, MD  INDICATION FOR STUDY:  Hypersomnia with sleep apnea.  EPWORTH SLEEPINESS SCORE:  Epworth sleepiness score 4/24.  BMI 39.1, weight 200 pounds, height 60 inches, neck 13.5 inches.  MEDICATIONS:  Home medications are charted and reviewed.  SLEEP ARCHITECTURE:  Total sleep time 271.5 minutes with sleep efficiency 67.8%.  Stage I was 14.7%, stage II 83.4%, stage III absent, REM 1.8% of total sleep time.  Sleep latency 20.5 minutes, REM latency 374.5 minutes, awake after sleep onset 180.5 minutes, arousal index 3.3. Bedtime medication:  None.  RESPIRATORY DATA:  Apnea-hypopnea index (AHI) 1.3 per hour.  A total of 6 events were scored including 2 central apneas and 4 hypopneas.  Events were associated with supine sleep position and REM.  REM AHI 24 per hour.  There were insufficient numbers of events to permit application of split protocol CPAP titration on the study night.  OXYGEN DATA:  Moderate snoring with oxygen desaturation to a nadir of 88% and mean oxygen saturation through the study of 95.4% on room air.  CARDIAC DATA:  Sinus rhythm with PACs.  MOVEMENT-PARASOMNIA:  No significant movement disturbance.  Bathroom x3.  IMPRESSIONS-RECOMMENDATIONS: 1. Occasional respiratory event with sleep disturbance, within normal     limits.  Apnea-hypopnea index 1.3 per hour (the normal range for     adults is from 0 to 5 events per hour).  Moderate snoring with     oxygen desaturation to a nadir of 88% and mean oxygen saturation     through the study of 95.4% on room air. 2. Sleep architecture was significant for  sustained intervals of     wakefulness and near absence of REM sleep.  She     might benefit from management as insomnia.  Note, requirement for     bathroom x3 representing additional sleep disturbance.     Courtlyn Aki D. Maple Hudson, MD, [facility], FACP Diplomate, American Board of Sleep Medicine    CDY/MEDQ  D:  12/26/2011 10:16:33  T:  12/26/2011 13:35:36  Job:  [id number]

## 2011-12-28 ENCOUNTER — Ambulatory Visit: Payer: PRIVATE HEALTH INSURANCE | Admitting: Rehabilitative and Restorative Service Providers"

## 2011-12-30 ENCOUNTER — Ambulatory Visit: Payer: PRIVATE HEALTH INSURANCE | Admitting: Rehabilitative and Restorative Service Providers"

## 2012-01-03 ENCOUNTER — Other Ambulatory Visit: Payer: Self-pay | Admitting: Cardiology

## 2012-01-04 ENCOUNTER — Encounter: Payer: PRIVATE HEALTH INSURANCE | Admitting: Rehabilitative and Restorative Service Providers"

## 2012-01-07 ENCOUNTER — Encounter: Payer: Self-pay | Admitting: Cardiology

## 2012-01-07 ENCOUNTER — Ambulatory Visit (INDEPENDENT_AMBULATORY_CARE_PROVIDER_SITE_OTHER): Payer: PRIVATE HEALTH INSURANCE | Admitting: Cardiology

## 2012-01-07 VITALS — BP 170/84 | HR 60 | Ht 64.0 in | Wt 231.0 lb

## 2012-01-07 DIAGNOSIS — I10 Essential (primary) hypertension: Secondary | ICD-10-CM

## 2012-01-07 DIAGNOSIS — I2510 Atherosclerotic heart disease of native coronary artery without angina pectoris: Secondary | ICD-10-CM

## 2012-01-07 DIAGNOSIS — E785 Hyperlipidemia, unspecified: Secondary | ICD-10-CM

## 2012-01-07 MED ORDER — AMLODIPINE BESYLATE 5 MG PO TABS: 5.0000 mg | ORAL_TABLET | Freq: Every day | ORAL | Status: DC

## 2012-01-07 NOTE — Patient Instructions (Addendum)
Your physician has recommended you make the following change in your medication: START Amlodipine 5mg  take one by mouth daily  Your physician recommends that you schedule a follow-up appointment in: 2 WEEKS  Your physician has requested that you regularly monitor and record your blood pressure readings at home. Please use the same machine at the same time of day to check your readings and record them to bring to your follow-up visit.  Please review your medications and see if you are taking Atorvastatin and Pravastatin--call the office on Monday to let us know about these medications, please bring all your medication bottles into the office at your next appointment.

## 2012-01-07 NOTE — Assessment & Plan Note (Signed)
Has two statin drugs listed.  Will have her call and clarify.

## 2012-01-07 NOTE — Progress Notes (Signed)
HPI:  Patient is doing well.  No chest pain.  Her BP at the [facility] was higher, and she does not have a home cuff.  She feels fine. She is not using a CPAP machine.  She does not sleep all that well, either.    Current Outpatient Prescriptions  Medication Sig Dispense Refill  . acetaminophen (TYLENOL) 325 MG tablet Take by mouth at bedtime as needed. For pain.       [name] acetaminophen-codeine (TYLENOL #3) 300-30 MG per tablet Take 1 tablet by mouth 3 (three) times daily as needed. For pain.  90 tablet  5  . aspirin 81 MG tablet Take 81 mg by mouth daily.        [name] atenolol (TENORMIN) 100 MG tablet Take 0.5 tablets (50 mg total) by mouth daily.  45 tablet  3  . atorvastatin (LIPITOR) 20 MG tablet take 1 tablet by mouth once daily  30 tablet  0  . B-D UF III MINI PEN NEEDLES 31G X 5 MM MISC USE TO INJECT LANTUS INSULIN ONE TIME EACH DAY  100 each  3  . benazepril (LOTENSIN) 40 MG tablet Take 1 tablet (40 mg total) by mouth daily.  90 tablet  3  . calcium carbonate (OS-CAL) 600 MG TABS Take 1 tablet (600 mg total) by mouth 2 (two) times daily with a meal.  60 tablet  6  . Cholecalciferol (VITAMIN D3) 1000 UNITS tablet Take 1 tablet (1,000 Units total) by mouth daily.  30 tablet  1  . cyanocobalamin (,VITAMIN B-12,) 1000 MCG/ML injection Inject 1 mL (1,000 mcg total) into the muscle every 30 (thirty) days.  10 mL  6  . diclofenac sodium (VOLTAREN) 1 % GEL Apply topically. Apply small amount to affected area up to 4 times a day.       . ergocalciferol (VITAMIN D2) 50000 UNITS capsule Take 1 capsule (50,000 Units total) by mouth once a week.  5 capsule  0  . ferrous sulfate 325 (65 FE) MG tablet Take 1 tablet (325 mg total) by mouth 2 (two) times daily with a meal.  60 tablet  2  . furosemide (LASIX) 40 MG tablet Take 1 tablet (40 mg total) by mouth daily.  30 tablet  6  . glucose blood test strip Use to check blood sugar three times a day  100 each  12  . insulin glargine (LANTUS  SOLOSTAR) 100 UNIT/ML injection Inject 15 Units into the skin at bedtime.  10 mL  6  . isosorbide mononitrate (IMDUR) 30 MG 24 hr tablet Take 1 tablet (30 mg total) by mouth daily.  90 tablet  1  . Magnesium Chloride (MAG-DELAY) 535 (64 MG) MG TBCR Take 1 tablet (64 mg total) by mouth 2 (two) times daily.  60 tablet  3  . metFORMIN (GLUCOPHAGE) 1000 MG tablet Take 1 tablet (1,000 mg total) by mouth 2 (two) times daily with a meal.  180 tablet  2  . Nitroglycerin (NITROSTAT SL) Use as directed        . PARoxetine (PAXIL) 40 MG tablet Take 1 tablet (40 mg total) by mouth every morning.  30 tablet  3  . pravastatin (PRAVACHOL) 80 MG tablet Take 1 tablet (80 mg total) by mouth daily.  90 tablet  3  . SYRINGE-NEEDLE, DISP, 3 ML 23G X 3/4" 3 ML MISC Use to inject b-12.  10 each  6    No Known Allergies  Past Medical  History  Diagnosis Date  . CAD (coronary artery disease)   . HTN (hypertension)   . Dyslipidemia   . Dyspnea on exertion   . Leg edema   . Neck pain   . Obesity   . GERD (gastroesophageal reflux disease)   . Diabetes mellitus   . Spinal stenosis, lumbar   . Depression   . Glaucoma   . Hemorrhoids   . Hypomagnesemia   . Diverticulosis of colon   . Osteopenia   . Periodic health assessment, general screening, adult   . Arm pain, right   . Hypokalemia   . Anemia   . Degenerative joint disease     Past Surgical History  Procedure Date  . Posterior lumbar interfusion surgery 07/18/07    L2-L3  . Lumbar fusion surgery 10/08    L3-L5  . Rotator cuff surgery   . Cholecystectomy   . Endometrial biospy     Family History  Problem Relation Age of Onset  . Hypertension Sister   . Hodgkin's lymphoma Sister     History   Social History  . Marital Status: Single    Spouse Name: N/A    Number of Children: N/A  . Years of Education: N/A   Occupational History  . Not on file.   Social History Main Topics  . Smoking status: Never Smoker   . Smokeless tobacco:  Not on file  . Alcohol Use: No  . Drug Use: No  . Sexually Active: Not on file   Other Topics Concern  . Not on file   Social History Narrative  . No narrative on file    ROS: Please see the HPI.  All other systems reviewed and negative.  PHYSICAL EXAM:  BP 170/84  Pulse 60  Ht 5\' 4"  (1.626 m)  Wt 231 lb (104.781 kg)  BMI 39.65 kg/m2  General: Well developed, well nourished, in no acute distress. Head:  Normocephalic and atraumatic. Neck: no JVD Lungs: Clear to auscultation and percussion. Heart: Normal S1 and S2.  No murmur, rubs or gallops.  Pulses: Pulses normal in all 4 extremities. Extremities: No clubbing or cyanosis. No edema. Neurologic: Alert and oriented x 3.  EKG:  NSR.  Borderline LAE.   ASSESSMENT AND PLAN:

## 2012-01-07 NOTE — Assessment & Plan Note (Signed)
Patients BP is elevated.  Will add amlodipine.

## 2012-01-12 ENCOUNTER — Ambulatory Visit (INDEPENDENT_AMBULATORY_CARE_PROVIDER_SITE_OTHER): Payer: PRIVATE HEALTH INSURANCE | Admitting: Family Medicine

## 2012-01-12 ENCOUNTER — Encounter: Payer: Self-pay | Admitting: Family Medicine

## 2012-01-12 VITALS — BP 170/76 | HR 61

## 2012-01-12 DIAGNOSIS — M19049 Primary osteoarthritis, unspecified hand: Secondary | ICD-10-CM

## 2012-01-12 DIAGNOSIS — M1990 Unspecified osteoarthritis, unspecified site: Secondary | ICD-10-CM

## 2012-01-12 MED ORDER — PREDNISONE 20 MG PO TABS: 20.0000 mg | ORAL_TABLET | Freq: Every day | ORAL | Status: DC

## 2012-01-12 NOTE — Progress Notes (Signed)
Patient Name: [name] [name] Date of Birth: May 13, 1946 Medical Record Number: [id number] Gender: female Date of Encounter: 01/12/2012  History of Present Illness:  [name] [name] is a 66 y.o. 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 She has been seen before and had a third MCP injection as well as de Quervain's injections. She had a left-sided thumb spica splint previously, and all this after her injections and calming him down wearing her thumb spica splints.  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 DIABETES MELLITUS, TYPE II  . VITAMIN B12 DEFICIENCY  . VITAMIN D DEFICIENCY  . DYSLIPIDEMIA  . HYPOMAGNESEMIA  . OBESITY  . ANEMIA, IRON DEFICIENCY  . ANEMIA, NORMOCYTIC  . DEPRESSION  . GLAUCOMA NOS  . HYPERTENSION  . CAD  . HEMORRHOIDS  . GERD  . DIVERTICULOSIS OF COLON  . GENERALIZED OSTEOARTHROSIS INVOLVING HAND  . DEGENERATIVE JOINT DISEASE  . NECK PAIN  . SPINAL STENOSIS, LUMBAR  . OSTEOPENIA  . HEADACHE, TEMPORAL  . Systolic murmur  . Peripheral edema  . Forearm swelling  . Insomnia   Past Medical History  Diagnosis Date  . CAD (coronary artery disease)   . HTN (hypertension)   . Dyslipidemia   . Dyspnea on exertion   . Leg edema   . Neck pain   . Obesity   . GERD (gastroesophageal reflux disease)   . Diabetes mellitus   . Spinal stenosis, lumbar   . Depression   . Glaucoma   . Hemorrhoids   . Hypomagnesemia   . Diverticulosis of colon   . Osteopenia   . Periodic health assessment, general screening, adult   . Arm pain, right   . Hypokalemia   . Anemia   . Degenerative joint disease    Past Surgical History  Procedure Date  . Posterior lumbar interfusion surgery 07/18/07   L2-L3  . Lumbar fusion surgery 10/08    L3-L5  . Rotator cuff surgery   . Cholecystectomy   . Endometrial biospy    History  Substance Use Topics  . Smoking status: Never Smoker   . Smokeless tobacco: Never Used  . Alcohol Use: No   Family History  Problem Relation Age of Onset  . Hypertension Sister   . Hodgkin's lymphoma Sister    No Known Allergies  Medication list has been reviewed and updated.  Review of Systems:  GEN: No fevers, chills. Nontoxic. Primarily MSK c/o today. MSK: Detailed in the HPI GI: tolerating PO intake without difficulty Neuro: No numbness, parasthesias, or tingling associated. Otherwise the pertinent positives of the ROS are noted above.    Physical Examination: Filed Vitals:   01/12/12 1334  BP: 170/76  Pulse: 61    There is no height or weight on file to calculate BMI.   GEN: WDWN, NAD, Non-toxic, Alert & Oriented x 3 HEENT: Atraumatic, Normocephalic.  Ears and Nose: No external deformity. EXTR: No clubbing/cyanosis/edema NEURO: Normal gait.  PSYCH: Normally interactive. Conversant. Not depressed or anxious appearing.  Calm demeanor.   Bilateral hands with boggy true wrist joints. Some restriction in motion with flexion and extension. Boggy synovium in the second and third right MCPs. Nontender DIPs and PIPs throughout. Finklestein's positive bilaterally  Assessment and Plan: 1. Hand arthritis  predniSONE (DELTASONE) 20 MG tablet  2. DEGENERATIVE JOINT DISEASE      Diffuse hand oa, worst 3rd MCP  Given R thumb spica  B spica splints x 3 weeks, then start rehab, recheck then  [facility] joint injection, 3rd R Verbal consent was obtained. Risks (including rare infection, pain), benefits, and alternatives were discussed. Prepped with Chloraprep and Ethyl Chloride used for anesthesia. Under sterile conditions, patient injected into 3rd CMC joint at joint line perpendicularly with traction placed. Aspiration yields no blood. Decreased pain  post injection. No complications. Needle size: 25 gauge Injection: 1/2 cc of Lidocaine 1% and 1/2 cc of Depo-Medrol 40 mg

## 2012-01-12 NOTE — Patient Instructions (Signed)
Wear your splints over the next 3 weeks 1 week of oral prednisone  Wear splints each night -- ideally can wear when using hand a lot also  Recheck with me in 3-4 weeks

## 2012-01-29 ENCOUNTER — Encounter: Payer: Self-pay | Admitting: Cardiology

## 2012-01-29 ENCOUNTER — Ambulatory Visit (INDEPENDENT_AMBULATORY_CARE_PROVIDER_SITE_OTHER): Payer: PRIVATE HEALTH INSURANCE | Admitting: Cardiology

## 2012-01-29 VITALS — BP 118/68 | HR 56 | Ht 62.0 in | Wt 226.1 lb

## 2012-01-29 DIAGNOSIS — E785 Hyperlipidemia, unspecified: Secondary | ICD-10-CM

## 2012-01-29 DIAGNOSIS — I10 Essential (primary) hypertension: Secondary | ICD-10-CM

## 2012-01-29 DIAGNOSIS — I2510 Atherosclerotic heart disease of native coronary artery without angina pectoris: Secondary | ICD-10-CM

## 2012-01-29 MED ORDER — NITROGLYCERIN 0.4 MG SL SUBL: 0.4000 mg | SUBLINGUAL_TABLET | SUBLINGUAL | Status: DC | PRN

## 2012-01-29 NOTE — Patient Instructions (Signed)
Your physician wants you to follow-up in: 6 MONTHS.  You will receive a reminder letter in the mail two months in advance. If you don't receive a letter, please call our office to schedule the follow-up appointment.  Your physician recommends that you continue on your current medications as directed. Please refer to the Current Medication list given to you today.  

## 2012-02-02 ENCOUNTER — Ambulatory Visit (INDEPENDENT_AMBULATORY_CARE_PROVIDER_SITE_OTHER): Payer: PRIVATE HEALTH INSURANCE | Admitting: Family Medicine

## 2012-02-02 ENCOUNTER — Encounter: Payer: Self-pay | Admitting: Family Medicine

## 2012-02-02 VITALS — BP 137/73 | HR 74

## 2012-02-02 DIAGNOSIS — M159 Polyosteoarthritis, unspecified: Secondary | ICD-10-CM

## 2012-02-02 MED ORDER — MELOXICAM 7.5 MG PO TABS: 7.5000 mg | ORAL_TABLET | Freq: Every day | ORAL | Status: DC

## 2012-02-02 NOTE — Progress Notes (Signed)
HPI:  She was brought back in today for follow up of her HTN.  She is doing well.  She has rare chest pain, and averages about 2 NTG per month.  It is brief.  It does not last long.  She has tolerated the new medication quite well.  No shortness of breath.   Current Outpatient Prescriptions  Medication Sig Dispense Refill  . acetaminophen-codeine (TYLENOL #3) 300-30 MG per tablet Take 1 tablet by mouth 3 (three) times daily as needed. For pain.  90 tablet  5  . amLODipine (NORVASC) 5 MG tablet Take 1 tablet (5 mg total) by mouth daily.  30 tablet  11  . aspirin 81 MG tablet Take 81 mg by mouth daily.        [name] atenolol (TENORMIN) 100 MG tablet Take 0.5 tablets (50 mg total) by mouth daily.  45 tablet  3  . atorvastatin (LIPITOR) 20 MG tablet take 1 tablet by mouth once daily  30 tablet  0  . B-D UF III MINI PEN NEEDLES 31G X 5 MM MISC USE TO INJECT LANTUS INSULIN ONE TIME EACH DAY  100 each  3  . benazepril (LOTENSIN) 40 MG tablet Take 1 tablet (40 mg total) by mouth daily.  90 tablet  3  . calcium carbonate (OS-CAL) 600 MG TABS Take 1 tablet (600 mg total) by mouth 2 (two) times daily with a meal.  60 tablet  6  . Cholecalciferol (VITAMIN D3) 1000 UNITS tablet Take 1 tablet (1,000 Units total) by mouth daily.  30 tablet  1  . cyanocobalamin (,VITAMIN B-12,) 1000 MCG/ML injection Inject 1 mL (1,000 mcg total) into the muscle every 30 (thirty) days.  10 mL  6  . diclofenac sodium (VOLTAREN) 1 % GEL Apply topically. Apply small amount to affected area up to 4 times a day.       . ergocalciferol (VITAMIN D2) 50000 UNITS capsule Take 1 capsule (50,000 Units total) by mouth once a week.  5 capsule  0  . ferrous sulfate 325 (65 FE) MG tablet Take 1 tablet (325 mg total) by mouth 2 (two) times daily with a meal.  60 tablet  2  . furosemide (LASIX) 40 MG tablet Take 1 tablet (40 mg total) by mouth daily.  30 tablet  6  . glipiZIDE (GLUCOTROL) 10 MG tablet Take 10 mg by mouth 2 (two) times daily before a  meal. 2 tablets twice a day      . glucose blood test strip Use to check blood sugar three times a day  100 each  12  . ibuprofen (ADVIL,MOTRIN) 400 MG tablet Take 400 mg by mouth every 6 (six) hours as needed.      . insulin glargine (LANTUS SOLOSTAR) 100 UNIT/ML injection Inject 15 Units into the skin at bedtime.  10 mL  6  . isosorbide mononitrate (IMDUR) 30 MG 24 hr tablet Take 1 tablet (30 mg total) by mouth daily.  90 tablet  1  . Magnesium Chloride (MAG-DELAY) 535 (64 MG) MG TBCR Take 1 tablet (64 mg total) by mouth 2 (two) times daily.  60 tablet  3  . metFORMIN (GLUCOPHAGE) 1000 MG tablet Take 1 tablet (1,000 mg total) by mouth 2 (two) times daily with a meal.  180 tablet  2  . oxyCODONE-acetaminophen (PERCOCET) 5-325 MG per tablet Take 1 tablet by mouth every 4 (four) hours as needed. 1/2 tablet to a tablet prn      .  PARoxetine (PAXIL) 40 MG tablet Take 1 tablet (40 mg total) by mouth every morning.  30 tablet  3  . SYRINGE-NEEDLE, DISP, 3 ML 23G X 3/4" 3 ML MISC Use to inject b-12.  10 each  6  . nitroGLYCERIN (NITROSTAT) 0.4 MG SL tablet Place 1 tablet (0.4 mg total) under the tongue every 5 (five) minutes as needed for chest pain.  25 tablet  2    No Known Allergies  Past Medical History  Diagnosis Date  . CAD (coronary artery disease)   . HTN (hypertension)   . Dyslipidemia   . Dyspnea on exertion   . Leg edema   . Neck pain   . Obesity   . GERD (gastroesophageal reflux disease)   . Diabetes mellitus   . Spinal stenosis, lumbar   . Depression   . Glaucoma   . Hemorrhoids   . Hypomagnesemia   . Diverticulosis of colon   . Osteopenia   . Periodic health assessment, general screening, adult   . Arm pain, right   . Hypokalemia   . Anemia   . Degenerative joint disease     Past Surgical History  Procedure Date  . Posterior lumbar interfusion surgery 07/18/07    L2-L3  . Lumbar fusion surgery 10/08    L3-L5  . Rotator cuff surgery   . Cholecystectomy   .  Endometrial biospy     Family History  Problem Relation Age of Onset  . Hypertension Sister   . Hodgkin's lymphoma Sister     History   Social History  . Marital Status: Single    Spouse Name: N/A    Number of Children: N/A  . Years of Education: N/A   Occupational History  . Not on file.   Social History Main Topics  . Smoking status: Never Smoker   . Smokeless tobacco: Never Used  . Alcohol Use: No  . Drug Use: No  . Sexually Active: Not on file   Other Topics Concern  . Not on file   Social History Narrative  . No narrative on file    ROS: Please see the HPI.  All other systems reviewed and negative.  PHYSICAL EXAM:  BP 118/68  Pulse 56  Ht 5\' 2"  (1.575 m)  Wt 226 lb 1.9 oz (102.567 kg)  BMI 41.36 kg/m2  General: Well developed, well nourished, in no acute distress. Head:  Normocephalic and atraumatic. Neck: no JVD Lungs: Clear to auscultation and percussion. Heart: Normal S1 and S2.  No murmur, rubs or gallops.  Extremities: No clubbing or cyanosis. No edema. Neurologic: Alert and oriented x 3.  EKG:  ASSESSMENT AND PLAN:

## 2012-02-02 NOTE — Progress Notes (Signed)
  Patient Name: [name] [name] Date of Birth: 03/09/1946 Age: 66 y.o. Medical Record Number: [id number] Gender: female Date of Encounter: 02/02/2012  History of Present Illness:  [name] [name] is a 66 y.o. very pleasant female patient who presents with the following:  F/u generalized hand OA, de quairvains, s/p 3rd MCP injection, prednisone burst, and the patient is doing much better. Minimal pain now, has been wearing her B thumb spica splints and feels much better.   Past Medical History, Surgical History, Social History, Family History, Problem List, Medications, and Allergies have been reviewed and updated if relevant.  Review of Systems:  GEN: No fevers, chills. Nontoxic. Primarily MSK c/o today. MSK: Detailed in the HPI GI: tolerating PO intake without difficulty Neuro: No numbness, parasthesias, or tingling associated. Otherwise the pertinent positives of the ROS are noted above.    Physical Examination: Filed Vitals:   02/02/12 1405  BP: 137/73  Pulse: 74    There is no height or weight on file to calculate BMI.   GEN: WDWN, NAD, Non-toxic, Alert & Oriented x 3 HEENT: Atraumatic, Normocephalic.  Ears and Nose: No external deformity. EXTR: No clubbing/cyanosis/edema NEURO: Normal gait.  PSYCH: Normally interactive. Conversant. Not depressed or anxious appearing.  Calm demeanor.   b hand Ecchymosis or edema: neg ROM wrist/hand/digits: full  Carpals, MCP's, digits: NT Distal Ulna and Radius: NT Ecchymosis or edema: neg No instability Cysts/nodules: neg Digit triggering: neg Finkelstein's test: neg Snuffbox tenderness: neg Scaphoid tubercle: NT Resisted supination: NT Full composite fist, no malrotation Grip, all digits: 5/5 str DIPJT: NT PIP JT: NT MCP JT: NT No tenosynovitis Axial load test: neg Phalen's: neg Tinel's: neg Atrophy: neg  Hand sensation: intact   Assessment and Plan: 1. GENERALIZED OSTEOARTHROSIS INVOLVING HAND     Hand oa and B  deq Much improved, calmed down greatly. Prn use of spica splints  mobic

## 2012-02-02 NOTE — Assessment & Plan Note (Signed)
She is at target.  

## 2012-02-02 NOTE — Assessment & Plan Note (Signed)
Much improved on amlodipine.  Would continue current dose.

## 2012-02-08 ENCOUNTER — Other Ambulatory Visit: Payer: Self-pay | Admitting: Cardiology

## 2012-02-08 NOTE — Telephone Encounter (Signed)
Refilled atorvastatin

## 2012-02-11 NOTE — Assessment & Plan Note (Signed)
Non obstructive plaque.  No critical stenosis.

## 2012-02-22 ENCOUNTER — Encounter: Payer: PRIVATE HEALTH INSURANCE | Admitting: Internal Medicine

## 2012-02-23 ENCOUNTER — Ambulatory Visit (INDEPENDENT_AMBULATORY_CARE_PROVIDER_SITE_OTHER): Payer: PRIVATE HEALTH INSURANCE | Admitting: Internal Medicine

## 2012-02-23 ENCOUNTER — Encounter: Payer: Self-pay | Admitting: Internal Medicine

## 2012-02-23 VITALS — BP 145/63 | HR 76 | Temp 96.8°F | Ht 64.0 in | Wt 228.7 lb

## 2012-02-23 DIAGNOSIS — E119 Type 2 diabetes mellitus without complications: Secondary | ICD-10-CM

## 2012-02-23 DIAGNOSIS — E538 Deficiency of other specified B group vitamins: Secondary | ICD-10-CM

## 2012-02-23 DIAGNOSIS — G4700 Insomnia, unspecified: Secondary | ICD-10-CM

## 2012-02-23 DIAGNOSIS — E559 Vitamin D deficiency, unspecified: Secondary | ICD-10-CM

## 2012-02-23 DIAGNOSIS — D509 Iron deficiency anemia, unspecified: Secondary | ICD-10-CM

## 2012-02-23 DIAGNOSIS — M1990 Unspecified osteoarthritis, unspecified site: Secondary | ICD-10-CM

## 2012-02-23 DIAGNOSIS — R609 Edema, unspecified: Secondary | ICD-10-CM

## 2012-02-23 DIAGNOSIS — M79602 Pain in left arm: Secondary | ICD-10-CM

## 2012-02-23 DIAGNOSIS — F329 Major depressive disorder, single episode, unspecified: Secondary | ICD-10-CM

## 2012-02-23 DIAGNOSIS — E785 Hyperlipidemia, unspecified: Secondary | ICD-10-CM

## 2012-02-23 DIAGNOSIS — M79609 Pain in unspecified limb: Secondary | ICD-10-CM

## 2012-02-23 DIAGNOSIS — I10 Essential (primary) hypertension: Secondary | ICD-10-CM

## 2012-02-23 DIAGNOSIS — M159 Polyosteoarthritis, unspecified: Secondary | ICD-10-CM

## 2012-02-23 DIAGNOSIS — F32A Depression, unspecified: Secondary | ICD-10-CM

## 2012-02-23 DIAGNOSIS — I2510 Atherosclerotic heart disease of native coronary artery without angina pectoris: Secondary | ICD-10-CM

## 2012-02-23 LAB — GLUCOSE, CAPILLARY: Glucose-Capillary: 191 mg/dL — ABNORMAL HIGH (ref 70–99)

## 2012-02-23 MED ORDER — MAGNESIUM CHLORIDE ER 535 (64 MG) MG PO TBCR: 1.0000 | EXTENDED_RELEASE_TABLET | Freq: Two times a day (BID) | ORAL | Status: DC

## 2012-02-23 MED ORDER — FUROSEMIDE 40 MG PO TABS: 40.0000 mg | ORAL_TABLET | Freq: Every day | ORAL | Status: DC

## 2012-02-23 MED ORDER — BENAZEPRIL HCL 40 MG PO TABS: 40.0000 mg | ORAL_TABLET | Freq: Every day | ORAL | Status: DC

## 2012-02-23 MED ORDER — FERROUS SULFATE 325 (65 FE) MG PO TABS: 325.0000 mg | ORAL_TABLET | Freq: Two times a day (BID) | ORAL | Status: DC

## 2012-02-23 MED ORDER — MELOXICAM 7.5 MG PO TABS: 7.5000 mg | ORAL_TABLET | Freq: Every day | ORAL | Status: DC

## 2012-02-23 MED ORDER — INSULIN GLARGINE 100 UNIT/ML ~~LOC~~ SOLN: 15.0000 [IU] | Freq: Every day | SUBCUTANEOUS | Status: DC

## 2012-02-23 MED ORDER — ATENOLOL 100 MG PO TABS: 50.0000 mg | ORAL_TABLET | Freq: Every day | ORAL | Status: DC

## 2012-02-23 MED ORDER — ZOLPIDEM TARTRATE ER 6.25 MG PO TBCR: 6.2500 mg | EXTENDED_RELEASE_TABLET | Freq: Every evening | ORAL | Status: DC | PRN

## 2012-02-23 MED ORDER — PAROXETINE HCL 40 MG PO TABS: 40.0000 mg | ORAL_TABLET | ORAL | Status: DC

## 2012-02-23 MED ORDER — AMLODIPINE BESYLATE 5 MG PO TABS: 5.0000 mg | ORAL_TABLET | Freq: Every day | ORAL | Status: DC

## 2012-02-23 MED ORDER — CYANOCOBALAMIN 1000 MCG/ML IJ SOLN
1000.0000 ug | Freq: Once | INTRAMUSCULAR | Status: AC
  Administered 2012-02-23: 1000 ug via INTRAMUSCULAR

## 2012-02-23 MED ORDER — ISOSORBIDE MONONITRATE ER 30 MG PO TB24: 30.0000 mg | ORAL_TABLET | Freq: Every day | ORAL | Status: DC

## 2012-02-23 MED ORDER — METFORMIN HCL 1000 MG PO TABS: 1000.0000 mg | ORAL_TABLET | Freq: Two times a day (BID) | ORAL | Status: DC

## 2012-02-23 NOTE — Assessment & Plan Note (Addendum)
Vitamin D level was 17 at last OV in 12/2011.  Patient has been taking weekly supplementation.  Will repeat vitamin D level today.  Advised patient to begin taking an over the counter calcium/vitamin D supplement daily.  If her vitamin level remains low, will replete with another 5 week course of vitamin D 50,000u.

## 2012-02-23 NOTE — Assessment & Plan Note (Signed)
Patient is not experiencing any significant hyperglycemia or hypoglycemic events.  She is continuing to take metformin and 15 units of Lantus.  Will not make any changes to her regimen at this time; her A1c was at goal at her last office visit.

## 2012-02-23 NOTE — Progress Notes (Signed)
Patient ID: [name] [name], female   DOB: Oct 07, 1946, 66 y.o.   MRN: [id number] Subjective:     HPI:  patient is a 66 from female here for routine followup.  1. Insomnia: Patient reports Ambien is helping her fall asleep and remaining asleep. She recently completed a sleep study.  2.  Diabetes: Review of glucometer reveals average CBG of160-180.  No episodes of hypoglycemia. She is currently using 15u of Lantus.  She has no other concerns or complaints today.   Review of Systems  Constitutional: Negative for fever, chills, diaphoresis, activity change, appetite change, fatigue and unexpected weight change.  HENT: Negative for hearing loss, congestion and neck stiffness.   Eyes: Negative for photophobia, pain and visual disturbance.  Respiratory: Negative for cough, chest tightness, shortness of breath and wheezing.   Cardiovascular: Negative for chest pain and palpitations.  Gastrointestinal: Negative for abdominal pain, blood in stool and anal bleeding.  Genitourinary: Negative for dysuria, hematuria and difficulty urinating.  Musculoskeletal: Negative for joint swelling.  Neurological: Negative for dizziness, syncope, speech difficulty, weakness, numbness and headaches.      Objective:   Physical Exam GEN: No apparent distress.  Alert and oriented x 3.  Pleasant, conversant, and cooperative to exam. HEENT: head is autraumatic and normocephalic.  Neck is supple without palpable masses or lymphadenopathy.  Vision intact.  EOMI.  PERRLA.  Sclerae anicteric.  Conjunctivae without pallor or injection. Mucous membranes are moist.  Oropharynx is without erythema, exudates, or other abnormal lesions.   RESP:  Lungs are clear to ascultation bilaterally with good air movement.  No wheezes, ronchi, or rubs. CARDIOVASCULAR: regular rate, normal rhythm.  Clear S1, S2, 2/6 systolic murmur, gallops, or rubs. EXT: warm and dry. No clubbing or cyanosis.  Trace edema in bilateral lower  extremities. SKIN: warm and dry with normal turgor.  No rashes or abnormal lesions observed.     Assessment/Plan:

## 2012-02-23 NOTE — Assessment & Plan Note (Signed)
Her symptoms are improved with the use of Ambien. She notes that Ambien helps her fall asleep and remaining asleep. She occasionally  wakes up during the night to urinate; she currently takes Lasix in the morning to help minimize the effect of Lasix on nocturia.  She demonstrates good sleep hygiene habits. Advised patient to restrict fluids after 4 PM to help minimize episodes of nocturia.  Will refill Ambien and follow up on this at her next OV.  Her sleep study did not support the need for CPAP.

## 2012-02-23 NOTE — Assessment & Plan Note (Signed)
CBC obtained at her last office visit revealed a normal hemoglobin and normal MCV.  Patient wishes to continue with monthly B12 injections as she believes it helps her feel better; I see no harm in this. Will give vitamin B12 injection today.

## 2012-02-23 NOTE — Assessment & Plan Note (Signed)
Patient currently alternates ibuprofen with Mobic for relief of pain related to osteoarthritis and degenerative joint disease. She does not take Mobic with ibuprofen and states she is aware that she should not take multiple nonsteroidal anti-inflammatory agents.  She states her pain is well-controlled.

## 2012-02-23 NOTE — Assessment & Plan Note (Signed)
Patient is continuing to use oral iron supplements. CBC at her last office visit revealed any hemoglobin and MCV within normal limits. She denies any bright red blood per rectum, dark tarry stools, hemoptysis, hematemesis, or other abnormal blood loss.  We'll continue to monitor

## 2012-02-23 NOTE — Assessment & Plan Note (Signed)
Blood pressure within acceptable limits.  Metabolic panel obtained in March revealed normal electrolytes and normal renal function. Will continue her current regimen.

## 2012-02-23 NOTE — Assessment & Plan Note (Signed)
Patient is doing well on Paxil. She denies depressive symptoms. She denies suicidal and homicidal ideation. We'll continue Paxil 40 mg per day.

## 2012-02-23 NOTE — Assessment & Plan Note (Signed)
LDL at goal at 64 as of 12/2011.  LFTs wnl.  Patient is tolerating statin well; will continue lipitor 20mg  daily.

## 2012-02-23 NOTE — Patient Instructions (Signed)
Schedule a follow up appointment with your new primary care doctor in 3-4 months, or sooner if needed. Keep taking your medications as directed. Call the clinic at [phone] with any concerns or questions. It has been a pleasure and privilege to care for you these past 3 years.  Best wishes for your future!

## 2012-02-24 LAB — VITAMIN D 25 HYDROXY (VIT D DEFICIENCY, FRACTURES): Vit D, 25-Hydroxy: 20 ng/mL — ABNORMAL LOW (ref 30–89)

## 2012-02-29 ENCOUNTER — Telehealth: Payer: Self-pay | Admitting: *Deleted

## 2012-02-29 NOTE — Telephone Encounter (Signed)
Received PA request from [facility]) [phone] for pts Zolpidem tart  ER 6.25 mg.  Pt has insomnia and depression.  PA was approved until 10/11/2012 under pts Medicare D plan...Marland KitchenKingsley Spittle Cassady5/20/20139:54 AM

## 2012-05-26 ENCOUNTER — Other Ambulatory Visit: Payer: Self-pay | Admitting: *Deleted

## 2012-05-26 MED ORDER — INSULIN PEN NEEDLE 31G X 5 MM MISC: Status: DC

## 2012-05-31 ENCOUNTER — Other Ambulatory Visit: Payer: Self-pay | Admitting: Internal Medicine

## 2012-05-31 DIAGNOSIS — Z1231 Encounter for screening mammogram for malignant neoplasm of breast: Secondary | ICD-10-CM

## 2012-06-14 ENCOUNTER — Other Ambulatory Visit: Payer: Self-pay | Admitting: *Deleted

## 2012-06-14 MED ORDER — IBUPROFEN 400 MG PO TABS: 400.0000 mg | ORAL_TABLET | Freq: Four times a day (QID) | ORAL | Status: DC | PRN

## 2012-06-16 ENCOUNTER — Ambulatory Visit (HOSPITAL_COMMUNITY)
Admission: RE | Admit: 2012-06-16 | Discharge: 2012-06-16 | Disposition: A | Discharge status: Home / Self Care | Payer: PRIVATE HEALTH INSURANCE | Source: Ambulatory Visit | Attending: Internal Medicine | Admitting: Internal Medicine

## 2012-06-16 DIAGNOSIS — Z1231 Encounter for screening mammogram for malignant neoplasm of breast: Secondary | ICD-10-CM | POA: Insufficient documentation

## 2012-06-28 ENCOUNTER — Other Ambulatory Visit: Payer: Self-pay | Admitting: *Deleted

## 2012-06-28 ENCOUNTER — Telehealth: Payer: Self-pay | Admitting: *Deleted

## 2012-06-28 MED ORDER — GLIPIZIDE 10 MG PO TABS: 10.0000 mg | ORAL_TABLET | Freq: Two times a day (BID) | ORAL | Status: DC

## 2012-06-28 NOTE — Telephone Encounter (Signed)
Electronic script cancelled

## 2012-06-28 NOTE — Addendum Note (Signed)
Addended byTacey Heap on: 06/28/2012 04:26 PM   Modules accepted: Orders

## 2012-06-28 NOTE — Telephone Encounter (Signed)
Cancelled refill

## 2012-07-15 ENCOUNTER — Ambulatory Visit (INDEPENDENT_AMBULATORY_CARE_PROVIDER_SITE_OTHER): Payer: PRIVATE HEALTH INSURANCE | Admitting: *Deleted

## 2012-07-15 DIAGNOSIS — Z23 Encounter for immunization: Secondary | ICD-10-CM

## 2012-07-26 ENCOUNTER — Other Ambulatory Visit: Payer: Self-pay | Admitting: *Deleted

## 2012-07-26 DIAGNOSIS — M159 Polyosteoarthritis, unspecified: Secondary | ICD-10-CM

## 2012-07-26 DIAGNOSIS — M1990 Unspecified osteoarthritis, unspecified site: Secondary | ICD-10-CM

## 2012-07-26 DIAGNOSIS — I10 Essential (primary) hypertension: Secondary | ICD-10-CM

## 2012-07-26 DIAGNOSIS — E119 Type 2 diabetes mellitus without complications: Secondary | ICD-10-CM

## 2012-07-26 DIAGNOSIS — F329 Major depressive disorder, single episode, unspecified: Secondary | ICD-10-CM

## 2012-07-26 DIAGNOSIS — M79602 Pain in left arm: Secondary | ICD-10-CM

## 2012-07-26 DIAGNOSIS — F32A Depression, unspecified: Secondary | ICD-10-CM

## 2012-07-27 ENCOUNTER — Encounter: Payer: Self-pay | Admitting: Cardiology

## 2012-07-27 ENCOUNTER — Ambulatory Visit (INDEPENDENT_AMBULATORY_CARE_PROVIDER_SITE_OTHER): Payer: PRIVATE HEALTH INSURANCE | Admitting: Cardiology

## 2012-07-27 VITALS — BP 120/64 | HR 61 | Ht 60.0 in | Wt 231.0 lb

## 2012-07-27 DIAGNOSIS — I10 Essential (primary) hypertension: Secondary | ICD-10-CM

## 2012-07-27 DIAGNOSIS — E785 Hyperlipidemia, unspecified: Secondary | ICD-10-CM

## 2012-07-27 DIAGNOSIS — I2510 Atherosclerotic heart disease of native coronary artery without angina pectoris: Secondary | ICD-10-CM

## 2012-07-27 LAB — CBC WITH DIFFERENTIAL/PLATELET
Basophils Absolute: 0 10*3/uL (ref 0.0–0.1)
Basophils Relative: 0.4 % (ref 0.0–3.0)
Eosinophils Absolute: 0 10*3/uL (ref 0.0–0.7)
Eosinophils Relative: 0.4 % (ref 0.0–5.0)
HCT: 36.5 % (ref 36.0–46.0)
Hemoglobin: 11.7 g/dL — ABNORMAL LOW (ref 12.0–15.0)
Lymphocytes Relative: 48.4 % — ABNORMAL HIGH (ref 12.0–46.0)
Lymphs Abs: 3.3 10*3/uL (ref 0.7–4.0)
MCHC: 32 g/dL (ref 30.0–36.0)
MCV: 91.3 fl (ref 78.0–100.0)
Monocytes Absolute: 0.4 10*3/uL (ref 0.1–1.0)
Monocytes Relative: 5.3 % (ref 3.0–12.0)
Neutro Abs: 3.1 10*3/uL (ref 1.4–7.7)
Neutrophils Relative %: 45.5 % (ref 43.0–77.0)
Platelets: 277 10*3/uL (ref 150.0–400.0)
RBC: 4 Mil/uL (ref 3.87–5.11)
RDW: 15.4 % — ABNORMAL HIGH (ref 11.5–14.6)
WBC: 6.9 10*3/uL (ref 4.5–10.5)

## 2012-07-27 LAB — BASIC METABOLIC PANEL
BUN: 18 mg/dL (ref 6–23)
CO2: 28 mEq/L (ref 19–32)
Calcium: 8.8 mg/dL (ref 8.4–10.5)
Chloride: 103 mEq/L (ref 96–112)
Creatinine, Ser: 0.6 mg/dL (ref 0.4–1.2)
GFR: 125.69 mL/min (ref >60.00)
Glucose, Bld: 143 mg/dL — ABNORMAL HIGH (ref 70–99)
Potassium: 3.9 mEq/L (ref 3.5–5.1)
Sodium: 138 mEq/L (ref 135–145)

## 2012-07-27 MED ORDER — INSULIN GLARGINE 100 UNIT/ML ~~LOC~~ SOLN: 15.0000 [IU] | Freq: Every day | SUBCUTANEOUS | Status: DC

## 2012-07-27 NOTE — Patient Instructions (Signed)
Your physician recommends that you have lab work today: BMP and CBC  Your physician recommends that you schedule a follow-up appointment in: MARCH 2014  Your physician recommends that you continue on your current medications as directed. Please refer to the Current Medication list given to you today.   

## 2012-07-30 NOTE — Progress Notes (Signed)
HPI:  Overall [name] [name] is really doing quite well. She is taking less nitroglycerin and she has in the past. She is checking her sugars up to 3 times per day. She denies any specific cardiac symptoms at the present time.  Current Outpatient Prescriptions  Medication Sig Dispense Refill  . amLODipine (NORVASC) 5 MG tablet Take 1 tablet (5 mg total) by mouth daily.  30 tablet  11  . aspirin 81 MG tablet Take 81 mg by mouth daily.        [name] atenolol (TENORMIN) 100 MG tablet Take 0.5 tablets (50 mg total) by mouth daily.  45 tablet  3  . atorvastatin (LIPITOR) 20 MG tablet take 1 tablet by mouth once daily  30 tablet  11  . benazepril (LOTENSIN) 40 MG tablet Take 1 tablet (40 mg total) by mouth daily.  90 tablet  3  . calcium carbonate (OS-CAL) 600 MG TABS Take 1 tablet (600 mg total) by mouth 2 (two) times daily with a meal.  60 tablet  6  . Cholecalciferol (VITAMIN D3) 1000 UNITS tablet Take 1 tablet (1,000 Units total) by mouth daily.  30 tablet  1  . cyanocobalamin (,VITAMIN B-12,) 1000 MCG/ML injection Inject 1 mL (1,000 mcg total) into the muscle every 30 (thirty) days.  10 mL  6  . diclofenac sodium (VOLTAREN) 1 % GEL Apply topically. Apply small amount to affected area up to 4 times a day.       . ergocalciferol (VITAMIN D2) 50000 UNITS capsule Take 1 capsule (50,000 Units total) by mouth once a week.  5 capsule  0  . ferrous sulfate 325 (65 FE) MG tablet Take 1 tablet (325 mg total) by mouth 2 (two) times daily with a meal.  60 tablet  2  . furosemide (LASIX) 40 MG tablet Take 1 tablet (40 mg total) by mouth daily.  30 tablet  6  . gabapentin (NEURONTIN) 300 MG capsule       . glucose blood test strip Use to check blood sugar three times a day  100 each  12  . ibuprofen (ADVIL,MOTRIN) 400 MG tablet Take 1 tablet (400 mg total) by mouth every 6 (six) hours as needed.  30 tablet  0  . Insulin Pen Needle (B-D UF III MINI PEN NEEDLES) 31G X 5 MM MISC Use to Inject Insulin One Time a Day  100 each   3  . isosorbide mononitrate (IMDUR) 30 MG 24 hr tablet Take 1 tablet (30 mg total) by mouth daily.  90 tablet  3  . Magnesium Chloride (MAG-DELAY) 535 (64 MG) MG TBCR Take 1 tablet (64 mg total) by mouth 2 (two) times daily.  60 tablet  3  . meloxicam (MOBIC) 7.5 MG tablet Take 1 tablet (7.5 mg total) by mouth daily.  30 tablet  4  . metFORMIN (GLUCOPHAGE) 1000 MG tablet Take 1 tablet (1,000 mg total) by mouth 2 (two) times daily with a meal.  180 tablet  2  . nitroGLYCERIN (NITROSTAT) 0.4 MG SL tablet Place 1 tablet (0.4 mg total) under the tongue every 5 (five) minutes as needed for chest pain.  25 tablet  2  . oxyCODONE-acetaminophen (PERCOCET/ROXICET) 5-325 MG per tablet       . PARoxetine (PAXIL) 40 MG tablet Take 1 tablet (40 mg total) by mouth every morning.  30 tablet  3  . SYRINGE-NEEDLE, DISP, 3 ML 23G X 3/4" 3 ML MISC Use to inject b-12.  10 each  6  . insulin glargine (LANTUS SOLOSTAR) 100 UNIT/ML injection Inject 15 Units into the skin at bedtime.  10 mL  11  . zolpidem (AMBIEN CR) 6.25 MG CR tablet Take 1 tablet (6.25 mg total) by mouth at bedtime as needed for sleep.  30 tablet  0  . zolpidem (AMBIEN CR) 6.25 MG CR tablet Take 1 tablet (6.25 mg total) by mouth at bedtime as needed for sleep.  30 tablet  4    No Known Allergies  Past Medical History  Diagnosis Date  . CAD (coronary artery disease)   . HTN (hypertension)   . Dyslipidemia   . Dyspnea on exertion   . Leg edema   . Neck pain   . Obesity   . GERD (gastroesophageal reflux disease)   . Diabetes mellitus   . Spinal stenosis, lumbar   . Depression   . Glaucoma(365)   . Hemorrhoids   . Hypomagnesemia   . Diverticulosis of colon   . Osteopenia   . Periodic health assessment, general screening, adult   . Arm pain, right   . Hypokalemia   . Anemia   . Degenerative joint disease     Past Surgical History  Procedure Date  . Posterior lumbar interfusion surgery 07/18/07    L2-L3  . Lumbar fusion surgery  10/08    L3-L5  . Rotator cuff surgery   . Cholecystectomy   . Endometrial biospy     Family History  Problem Relation Age of Onset  . Hypertension Sister   . Hodgkin's lymphoma Sister     History   Social History  . Marital Status: Single    Spouse Name: N/A    Number of Children: N/A  . Years of Education: N/A   Occupational History  . Not on file.   Social History Main Topics  . Smoking status: Never Smoker   . Smokeless tobacco: Never Used  . Alcohol Use: No  . Drug Use: No  . Sexually Active: Not on file   Other Topics Concern  . Not on file   Social History Narrative  . No narrative on file    ROS: Please see the HPI.  All other systems reviewed and negative.  PHYSICAL EXAM:  BP 120/64  Pulse 61  Ht 5' (1.524 m)  Wt 231 lb (104.781 kg)  BMI 45.11 kg/m2  General: Very sweet, obese female in no distress.   Head:  Normocephalic and atraumatic. Neck: no JVD Lungs: Clear to auscultation and percussion. Heart: Normal S1 and S2.  No murmur, rubs or gallops.  Pulses: Pulses normal in all 4 extremities. Extremities: No clubbing or cyanosis. No edema. Neurologic: Alert and oriented x 3.  EKG:   ASSESSMENT AND PLAN:

## 2012-07-31 NOTE — Assessment & Plan Note (Signed)
This patient has a lot of reasons for disease progression. She is however stable and her last catheterization in 2005 revealed multiple areas of nonobstructive disease. Preventive medical therapy is recommended, and I would recommend that she continue to take every measure to prevent progression of disease.

## 2012-07-31 NOTE — Assessment & Plan Note (Signed)
She remains on atorvastatin, and her LDL is at target

## 2012-07-31 NOTE — Assessment & Plan Note (Signed)
This is currently well controlled 

## 2012-08-01 ENCOUNTER — Other Ambulatory Visit: Payer: Self-pay | Admitting: *Deleted

## 2012-08-01 ENCOUNTER — Other Ambulatory Visit: Payer: Self-pay | Admitting: Internal Medicine

## 2012-08-01 DIAGNOSIS — I10 Essential (primary) hypertension: Secondary | ICD-10-CM

## 2012-08-01 DIAGNOSIS — M79602 Pain in left arm: Secondary | ICD-10-CM

## 2012-08-01 DIAGNOSIS — E119 Type 2 diabetes mellitus without complications: Secondary | ICD-10-CM

## 2012-08-01 DIAGNOSIS — M159 Polyosteoarthritis, unspecified: Secondary | ICD-10-CM

## 2012-08-01 DIAGNOSIS — F32A Depression, unspecified: Secondary | ICD-10-CM

## 2012-08-01 DIAGNOSIS — F329 Major depressive disorder, single episode, unspecified: Secondary | ICD-10-CM

## 2012-08-01 DIAGNOSIS — M1990 Unspecified osteoarthritis, unspecified site: Secondary | ICD-10-CM

## 2012-08-01 MED ORDER — INSULIN GLARGINE 100 UNIT/ML ~~LOC~~ SOLN: 15.0000 [IU] | Freq: Every day | SUBCUTANEOUS | Status: DC

## 2012-08-01 NOTE — Telephone Encounter (Signed)
[facility] pharmacy was called;Lantus rx was received.

## 2012-08-01 NOTE — Telephone Encounter (Signed)
Rx was refilled on 10/15 but "No Print"; cannot be call in to the pharmacy. Can you re-fill this and send electronically/  Thanks

## 2012-08-01 NOTE — Telephone Encounter (Signed)
Has appt 11/13

## 2012-08-01 NOTE — Telephone Encounter (Signed)
Called to pharm 

## 2012-08-26 ENCOUNTER — Other Ambulatory Visit: Payer: Self-pay | Admitting: *Deleted

## 2012-08-26 DIAGNOSIS — F329 Major depressive disorder, single episode, unspecified: Secondary | ICD-10-CM

## 2012-08-26 DIAGNOSIS — M159 Polyosteoarthritis, unspecified: Secondary | ICD-10-CM

## 2012-08-26 DIAGNOSIS — M79602 Pain in left arm: Secondary | ICD-10-CM

## 2012-08-26 DIAGNOSIS — I10 Essential (primary) hypertension: Secondary | ICD-10-CM

## 2012-08-26 DIAGNOSIS — E119 Type 2 diabetes mellitus without complications: Secondary | ICD-10-CM

## 2012-08-26 DIAGNOSIS — M1990 Unspecified osteoarthritis, unspecified site: Secondary | ICD-10-CM

## 2012-08-26 DIAGNOSIS — F32A Depression, unspecified: Secondary | ICD-10-CM

## 2012-08-26 MED ORDER — PAROXETINE HCL 40 MG PO TABS: 40.0000 mg | ORAL_TABLET | ORAL | Status: DC

## 2012-08-26 NOTE — Telephone Encounter (Signed)
Has 11/21 appt with PCP. Will refill

## 2012-09-01 ENCOUNTER — Other Ambulatory Visit (HOSPITAL_COMMUNITY)
Admission: RE | Admit: 2012-09-01 | Discharge: 2012-09-01 | Disposition: A | Discharge status: Home / Self Care | Payer: PRIVATE HEALTH INSURANCE | Source: Ambulatory Visit | Attending: Internal Medicine | Admitting: Internal Medicine

## 2012-09-01 ENCOUNTER — Ambulatory Visit (INDEPENDENT_AMBULATORY_CARE_PROVIDER_SITE_OTHER): Payer: PRIVATE HEALTH INSURANCE | Admitting: Internal Medicine

## 2012-09-01 ENCOUNTER — Encounter: Payer: Self-pay | Admitting: Internal Medicine

## 2012-09-01 VITALS — BP 137/69 | HR 86 | Temp 97.9°F | Ht 61.0 in | Wt 230.4 lb

## 2012-09-01 DIAGNOSIS — E119 Type 2 diabetes mellitus without complications: Secondary | ICD-10-CM

## 2012-09-01 DIAGNOSIS — F3289 Other specified depressive episodes: Secondary | ICD-10-CM

## 2012-09-01 DIAGNOSIS — F329 Major depressive disorder, single episode, unspecified: Secondary | ICD-10-CM

## 2012-09-01 DIAGNOSIS — G4700 Insomnia, unspecified: Secondary | ICD-10-CM

## 2012-09-01 DIAGNOSIS — G629 Polyneuropathy, unspecified: Secondary | ICD-10-CM

## 2012-09-01 DIAGNOSIS — Z8669 Personal history of other diseases of the nervous system and sense organs: Secondary | ICD-10-CM

## 2012-09-01 DIAGNOSIS — E785 Hyperlipidemia, unspecified: Secondary | ICD-10-CM

## 2012-09-01 DIAGNOSIS — IMO0001 Reserved for inherently not codable concepts without codable children: Secondary | ICD-10-CM

## 2012-09-01 DIAGNOSIS — D239 Other benign neoplasm of skin, unspecified: Secondary | ICD-10-CM

## 2012-09-01 DIAGNOSIS — D229 Melanocytic nevi, unspecified: Secondary | ICD-10-CM

## 2012-09-01 DIAGNOSIS — M129 Arthropathy, unspecified: Secondary | ICD-10-CM

## 2012-09-01 DIAGNOSIS — M1990 Unspecified osteoarthritis, unspecified site: Secondary | ICD-10-CM

## 2012-09-01 DIAGNOSIS — I10 Essential (primary) hypertension: Secondary | ICD-10-CM

## 2012-09-01 DIAGNOSIS — Z299 Encounter for prophylactic measures, unspecified: Secondary | ICD-10-CM

## 2012-09-01 DIAGNOSIS — D2370 Other benign neoplasm of skin of unspecified lower limb, including hip: Secondary | ICD-10-CM

## 2012-09-01 DIAGNOSIS — F32A Depression, unspecified: Secondary | ICD-10-CM

## 2012-09-01 DIAGNOSIS — M159 Polyosteoarthritis, unspecified: Secondary | ICD-10-CM

## 2012-09-01 DIAGNOSIS — M79602 Pain in left arm: Secondary | ICD-10-CM

## 2012-09-01 DIAGNOSIS — E559 Vitamin D deficiency, unspecified: Secondary | ICD-10-CM

## 2012-09-01 DIAGNOSIS — E538 Deficiency of other specified B group vitamins: Secondary | ICD-10-CM

## 2012-09-01 LAB — LIPID PANEL
Cholesterol: 132 mg/dL (ref 0–200)
HDL: 51 mg/dL (ref >39)
LDL Cholesterol: 70 mg/dL (ref 0–99)
Total CHOL/HDL Ratio: 2.6 Ratio
Triglycerides: 55 mg/dL (ref <150)
VLDL: 11 mg/dL (ref 0–40)

## 2012-09-01 LAB — POCT GLYCOSYLATED HEMOGLOBIN (HGB A1C): Hemoglobin A1C: 9.2

## 2012-09-01 LAB — BASIC METABOLIC PANEL
BUN: 23 mg/dL (ref 6–23)
CO2: 27 mEq/L (ref 19–32)
Calcium: 9.2 mg/dL (ref 8.4–10.5)
Chloride: 102 mEq/L (ref 96–112)
Creat: 0.66 mg/dL (ref 0.50–1.10)
Glucose, Bld: 249 mg/dL — ABNORMAL HIGH (ref 70–99)
Potassium: 4.1 mEq/L (ref 3.5–5.3)
Sodium: 141 mEq/L (ref 135–145)

## 2012-09-01 LAB — GLUCOSE, CAPILLARY: Glucose-Capillary: 236 mg/dL — ABNORMAL HIGH (ref 70–99)

## 2012-09-01 LAB — VITAMIN B12: Vitamin B-12: 500 pg/mL (ref 211–911)

## 2012-09-01 MED ORDER — VARICELLA VIRUS VACCINE LIVE 1350 PFU/0.5ML IJ SUSR: 0.5000 mL | Freq: Once | INTRAMUSCULAR | Status: DC

## 2012-09-01 MED ORDER — INSULIN GLARGINE 100 UNIT/ML ~~LOC~~ SOLN: 20.0000 [IU] | Freq: Every day | SUBCUTANEOUS | Status: DC

## 2012-09-01 MED ORDER — GABAPENTIN 400 MG PO CAPS: 400.0000 mg | ORAL_CAPSULE | Freq: Three times a day (TID) | ORAL | Status: DC

## 2012-09-01 NOTE — Assessment & Plan Note (Signed)
Under aseptic precautions Local anesthesia was used and shave excision was performed of the lesion on the left 4 th toe. Good hemostasis maintained with chemical cautery. Patient tolerated procedure well and the specimen was sent for H and E stain.

## 2012-09-01 NOTE — Progress Notes (Signed)
Subjective:     Patient ID: [name] [name], female   DOB: 1946/05/05, 66 y.o.   MRN: [id number]  HPI [name] [name] is a very sweet 66 year old lady who comes in to establish care today. She says she can barely walk due to pain in her back and her joints. Her sugars have not been under control recently and she is not sure of the reason for the same. She did have an episode of hypoglycemia last month which was symptomatic and responded to toast and juice.She says she has episodes when she feels so tired and does not want to do anything. She reports her depression to be under control. She also states that she is going to cook an elaborate meal for thanksgiving with her grandchildren visiting her.   Review of Systems 12 point review of symptoms is negative except for documented in HPI.     Objective:   Physical Exam  Constitutional: She is oriented to person, place, and time. She appears well-developed and well-nourished.  HENT:  Head: Normocephalic and atraumatic.  Eyes: Conjunctivae normal are normal. Pupils are equal, round, and reactive to light.  Neck: Normal range of motion.  Cardiovascular: Normal rate and regular rhythm.   Pulmonary/Chest: Effort normal.  Abdominal: Soft. Bowel sounds are normal.  Musculoskeletal: She exhibits edema and tenderness.       Of the small joints on her hand bilaterally  Neurological: She is alert and oriented to person, place, and time.  Skin: Skin is warm and dry.     Psychiatric: She has a normal mood and affect.  Has bilateral lower extremity one plus edema.     Assessment:    DM type II:  Uncontrolled. Increase Lantus to 20 units in the night. She is aware of precautions to take in case of recurrent hypoglycemia. Arthritis : Deformed small joints of the hand. X ray follow up. ? Inflammatory OA Vs RA. Will investigate further in next visit. Vitamin D deficiency: Stop Vitamin D and check levels. Vitamin B 12 deficiency : Check levels. Diabetic  Neuropathy: will increased does of gabapentin. HTN : well controlled Hyperlipidemia : well controlled. Preventive health care: dexa scan and Zoster vaccine. Mole : no features of atypical mole on examination but given African american want to rule out Lentigo Maligna. Biopsy performed. Plan:

## 2012-09-01 NOTE — Patient Instructions (Addendum)
Your sugars are not under control.We have increased the does of your Lantus to 20 units at night.  Please call us for any questions or concerns.  Your caregiver has removed (excised) a mole. Most moles are benign (non cancerous). Some moles may change over time and require biopsy (tissue sample) or removal. The mole usually is removed by shaving or cutting it from the skin. You will have stitches in your skin if the mole is large. A small mole, or one that is shaved off, may require only a small bandage. Your caregiver will send a piece of the mole to the laboratory (pathology) to examine it under a microscope for signs of cancer. Make sure you get your biopsy results when you return for your follow-up visit. Call if there is no return visit. HOME CARE INSTRUCTIONS   If the biopsied area was the arm or leg, keep it raised (above the level of your heart) to decrease pain and swelling, if you are having any.  Keep the wound and dressing clean and dry. Clean as necessary.  If the dressing gets wet, remove it slowly and carefully. If it sticks, use warm, soapy water to gently loosen it. Pat the area dry with a clean towel before putting on another dressing.  Return in 7 days or as directed to have your sutures (stitches) removed.  Call in 3 to 4 days, or as directed, for the results of your biopsy. SEEK IMMEDIATE MEDICAL CARE IF:   You have a fever.  You have excess blood soaking through the dressing.  You have increasing pain and swelling in the wound.  You have numbness or swelling below the wound.  You have redness, swelling, pus, a bad smell, or red streaks coming away from the wound, or any other signs of infection. MAKE SURE YOU:   Understand these instructions.  Will watch your condition.  Will get help right away if you are not doing well or get worse. Document Released: 09/25/2000 Document Revised: 12/21/2011 Document Reviewed: 08/31/2007 [facility] Patient Information 2013  [place], [place]. Pigmented Nevus (Moles) Your caregiver has determined you have a non-cancerous (benign) growth, also known as moles (benign melanocytic nevi or pigmented nevi). Moles are accumulations of color (pigment) cells in the skin that:  May remain flat.  Become raised.  Contain hairs.  Remain smooth.  Develop wrinkling. It is important to watch moles because they may develop changes and become cancerous. This is not the common outcome. Most moles remain non-cancerous. Moles tend to increase in number during the first 3 decades of life. In addition, sun exposure increases the number of moles. Heredity also plays a role in the development of moles. If you have parents with a large number of moles, you are more likely to get moles. If a nevus becomes worrisome your caregiver may choose to remove it and have a specialist in looking at cells (pathologist) examine it under the microscope to make sure it has not become cancerous.  LET YOUR CAREGIVER KNOW IF:  There is change in color or size of a mole.  Itching or bleeding develops.  The mole becomes larger than the diameter of a pencil.  There is any spreading of color or redness and inflammation to areas near the mole.  There is scaling, shedding of skin, or oozing from the mole.  Pain or soreness and redness (inflammation) develop.  The mole develops more than one color.  The mole develops irregular borders.  The mole develops flat and  also raised areas within the mole.  If hardness or softening develops within the mole or colored area. Changes in a mole may be a concern. If any of the changes noted above occur in your mole, have a trained professional evaluate it and determine what treatment, if any, is necessary. It is important to look at your moles periodically so that you are able to notice any changes if they occur. Of note, moles tend to darken during pregnancy and with birth control pills (oral contraceptive). If you  have a large number of moles, it is important to be seen annually by a skin doctor (dermatologist) for a total body skin examination. Document Released: 06/23/2001 Document Revised: 12/21/2011 Document Reviewed: 11/23/2008 [facility] Patient Information 2013 [place], [place].

## 2012-09-02 LAB — VITAMIN D 25 HYDROXY (VIT D DEFICIENCY, FRACTURES): Vit D, 25-Hydroxy: 22 ng/mL — ABNORMAL LOW (ref 30–89)

## 2012-09-05 ENCOUNTER — Telehealth: Payer: Self-pay | Admitting: Internal Medicine

## 2012-09-05 MED ORDER — ERGOCALCIFEROL 1.25 MG (50000 UT) PO CAPS: 50000.0000 [IU] | ORAL_CAPSULE | ORAL | Status: DC

## 2012-09-05 NOTE — Telephone Encounter (Signed)
See telephone note.

## 2012-09-06 ENCOUNTER — Ambulatory Visit (HOSPITAL_COMMUNITY)
Admission: RE | Admit: 2012-09-06 | Discharge: 2012-09-06 | Disposition: A | Discharge status: Home / Self Care | Payer: PRIVATE HEALTH INSURANCE | Source: Ambulatory Visit | Attending: Internal Medicine | Admitting: Internal Medicine

## 2012-09-06 DIAGNOSIS — I7090 Unspecified atherosclerosis: Secondary | ICD-10-CM | POA: Insufficient documentation

## 2012-09-06 DIAGNOSIS — M11249 Other chondrocalcinosis, unspecified hand: Secondary | ICD-10-CM | POA: Insufficient documentation

## 2012-09-06 DIAGNOSIS — M79609 Pain in unspecified limb: Secondary | ICD-10-CM | POA: Insufficient documentation

## 2012-09-06 DIAGNOSIS — M19049 Primary osteoarthritis, unspecified hand: Secondary | ICD-10-CM | POA: Insufficient documentation

## 2012-09-06 DIAGNOSIS — G8929 Other chronic pain: Secondary | ICD-10-CM | POA: Insufficient documentation

## 2012-09-12 ENCOUNTER — Other Ambulatory Visit: Payer: Self-pay | Admitting: Internal Medicine

## 2012-09-12 ENCOUNTER — Ambulatory Visit (HOSPITAL_COMMUNITY)
Admission: RE | Admit: 2012-09-12 | Discharge: 2012-09-12 | Disposition: A | Discharge status: Home / Self Care | Payer: PRIVATE HEALTH INSURANCE | Source: Ambulatory Visit | Attending: Internal Medicine | Admitting: Internal Medicine

## 2012-09-12 DIAGNOSIS — Z299 Encounter for prophylactic measures, unspecified: Secondary | ICD-10-CM

## 2012-09-12 DIAGNOSIS — Z13820 Encounter for screening for osteoporosis: Secondary | ICD-10-CM | POA: Insufficient documentation

## 2012-09-12 DIAGNOSIS — Z780 Asymptomatic menopausal state: Secondary | ICD-10-CM | POA: Insufficient documentation

## 2012-09-30 ENCOUNTER — Encounter: Payer: Self-pay | Admitting: Internal Medicine

## 2012-11-14 ENCOUNTER — Other Ambulatory Visit: Payer: Self-pay | Admitting: *Deleted

## 2012-11-14 DIAGNOSIS — R609 Edema, unspecified: Secondary | ICD-10-CM

## 2012-11-14 DIAGNOSIS — I10 Essential (primary) hypertension: Secondary | ICD-10-CM

## 2012-11-14 MED ORDER — FUROSEMIDE 40 MG PO TABS: 40.0000 mg | ORAL_TABLET | Freq: Every day | ORAL | Status: DC

## 2012-11-14 NOTE — Telephone Encounter (Signed)
Last filled 09/12/13

## 2012-11-14 NOTE — Telephone Encounter (Signed)
Pls sch appt with me March if possible. DM uncontrolled and insulin increased

## 2012-11-29 ENCOUNTER — Other Ambulatory Visit: Payer: Self-pay | Admitting: *Deleted

## 2012-11-29 DIAGNOSIS — G629 Polyneuropathy, unspecified: Secondary | ICD-10-CM

## 2012-11-29 MED ORDER — GABAPENTIN 400 MG PO CAPS: 400.0000 mg | ORAL_CAPSULE | Freq: Three times a day (TID) | ORAL | Status: DC

## 2012-11-30 ENCOUNTER — Telehealth: Payer: Self-pay | Admitting: *Deleted

## 2012-11-30 DIAGNOSIS — G4700 Insomnia, unspecified: Secondary | ICD-10-CM

## 2012-11-30 MED ORDER — ZOLPIDEM TARTRATE ER 6.25 MG PO TBCR: 6.2500 mg | EXTENDED_RELEASE_TABLET | Freq: Every evening | ORAL | Status: DC | PRN

## 2012-11-30 NOTE — Telephone Encounter (Signed)
Has March appt with me. I have never seen her. She needs to keep appt as DM is uncontrolled.

## 2012-11-30 NOTE — Telephone Encounter (Signed)
Needs refill on Zolpidem Tart ER 6.25mg  tab - last refill 02/29/12.

## 2012-12-07 NOTE — Telephone Encounter (Signed)
Rx called in to pharmacy. 

## 2012-12-13 ENCOUNTER — Encounter: Payer: PRIVATE HEALTH INSURANCE | Admitting: Internal Medicine

## 2012-12-13 ENCOUNTER — Encounter: Payer: Self-pay | Admitting: Internal Medicine

## 2012-12-27 ENCOUNTER — Ambulatory Visit: Payer: PRIVATE HEALTH INSURANCE | Admitting: Cardiology

## 2012-12-27 ENCOUNTER — Other Ambulatory Visit: Payer: Self-pay | Admitting: Internal Medicine

## 2013-01-03 ENCOUNTER — Encounter: Payer: Self-pay | Admitting: Internal Medicine

## 2013-01-03 ENCOUNTER — Ambulatory Visit (INDEPENDENT_AMBULATORY_CARE_PROVIDER_SITE_OTHER): Payer: PRIVATE HEALTH INSURANCE | Admitting: Internal Medicine

## 2013-01-03 VITALS — BP 143/68 | HR 57 | Temp 97.8°F | Wt 232.5 lb

## 2013-01-03 DIAGNOSIS — D649 Anemia, unspecified: Secondary | ICD-10-CM

## 2013-01-03 DIAGNOSIS — G894 Chronic pain syndrome: Secondary | ICD-10-CM

## 2013-01-03 DIAGNOSIS — G4700 Insomnia, unspecified: Secondary | ICD-10-CM

## 2013-01-03 DIAGNOSIS — M48061 Spinal stenosis, lumbar region without neurogenic claudication: Secondary | ICD-10-CM

## 2013-01-03 DIAGNOSIS — E119 Type 2 diabetes mellitus without complications: Secondary | ICD-10-CM

## 2013-01-03 DIAGNOSIS — E785 Hyperlipidemia, unspecified: Secondary | ICD-10-CM

## 2013-01-03 DIAGNOSIS — K219 Gastro-esophageal reflux disease without esophagitis: Secondary | ICD-10-CM

## 2013-01-03 DIAGNOSIS — Z23 Encounter for immunization: Secondary | ICD-10-CM

## 2013-01-03 DIAGNOSIS — M899 Disorder of bone, unspecified: Secondary | ICD-10-CM

## 2013-01-03 DIAGNOSIS — F329 Major depressive disorder, single episode, unspecified: Secondary | ICD-10-CM

## 2013-01-03 DIAGNOSIS — E538 Deficiency of other specified B group vitamins: Secondary | ICD-10-CM

## 2013-01-03 DIAGNOSIS — M949 Disorder of cartilage, unspecified: Secondary | ICD-10-CM

## 2013-01-03 DIAGNOSIS — E669 Obesity, unspecified: Secondary | ICD-10-CM

## 2013-01-03 DIAGNOSIS — M542 Cervicalgia: Secondary | ICD-10-CM

## 2013-01-03 DIAGNOSIS — G8929 Other chronic pain: Secondary | ICD-10-CM

## 2013-01-03 DIAGNOSIS — I2510 Atherosclerotic heart disease of native coronary artery without angina pectoris: Secondary | ICD-10-CM

## 2013-01-03 DIAGNOSIS — I10 Essential (primary) hypertension: Secondary | ICD-10-CM

## 2013-01-03 DIAGNOSIS — E559 Vitamin D deficiency, unspecified: Secondary | ICD-10-CM

## 2013-01-03 HISTORY — DX: Cervicalgia: M54.2

## 2013-01-03 HISTORY — DX: Other chronic pain: G89.29

## 2013-01-03 HISTORY — DX: Chronic pain syndrome: G89.4

## 2013-01-03 LAB — POCT GLYCOSYLATED HEMOGLOBIN (HGB A1C): Hemoglobin A1C: 9

## 2013-01-03 LAB — GLUCOSE, CAPILLARY: Glucose-Capillary: 146 mg/dL — ABNORMAL HIGH (ref 70–99)

## 2013-01-03 MED ORDER — RANITIDINE HCL 150 MG PO TABS: 150.0000 mg | ORAL_TABLET | Freq: Two times a day (BID) | ORAL | Status: DC

## 2013-01-03 MED ORDER — CYANOCOBALAMIN 1000 MCG/ML IJ SOLN
1000.0000 ug | Freq: Once | INTRAMUSCULAR | Status: AC
  Administered 2013-01-03: 1000 ug via INTRAMUSCULAR

## 2013-01-03 MED ORDER — DICLOFENAC SODIUM 1 % TD GEL: 4.0000 g | Freq: Four times a day (QID) | TRANSDERMAL | Status: DC

## 2013-01-03 NOTE — Assessment & Plan Note (Signed)
On statin and LDL at goal  

## 2013-01-03 NOTE — Assessment & Plan Note (Signed)
Since she barely met the criteria for osteopenia & BMD has increased since then, cont calcium and Vit D. Need to review Vit D prep and levels next visit.

## 2013-01-03 NOTE — Assessment & Plan Note (Signed)
We reviewed her weight curve for past 5 yrs. Stable. No sig increase.

## 2013-01-03 NOTE — Assessment & Plan Note (Signed)
She describes heartburn sxs at night when she lays down. She eats nothing for 4 hours prior to bed. She uses two pillows. Not interested in elevating HOB. Will use H2B, Since QD sxs, a PPI is rec as first with step down tx. However, her sxs were not reported spontaneously by pt and only gotten by ROS. Therefore, start with H2B and titrate up. H2B QHS, if no better then increase to BID, if no better to call and can start PPI and consider GI referral for EGD.

## 2013-01-03 NOTE — Assessment & Plan Note (Signed)
BP Readings from Last 3 Encounters:  01/03/13 143/68  09/01/12 137/69  07/27/12 120/64    Lab Results  Component Value Date   NA 141 09/01/2012   K 4.1 09/01/2012   CREATININE 0.66 09/01/2012    Assessment:  Blood pressure control: mildly elevated  Progress toward BP goal:  unchanged  Comments: Recheck was better and at goal per newest [facility] recs. Will leave meds as is  Plan:  Medications:  continue current medications  Educational resources provided: brochure;video  Self management tools provided: home blood pressure logbook  Other plans: See above.

## 2013-01-03 NOTE — Progress Notes (Signed)
  Subjective:    Patient ID: [name] [name], female    DOB: 30-Sep-1946, 67 y.o.   MRN: [id number]  HPI  I am seeing MS Boggus for the first time today. Please see the A&P for the status of the pt's chronic medical problems.   Review of Systems  Constitutional: Negative for activity change, appetite change and unexpected weight change.  HENT: Positive for rhinorrhea. Negative for sore throat.   Eyes: Positive for itching.  Respiratory: Negative for cough and shortness of breath.   Cardiovascular: Negative for chest pain and leg swelling.       No DOE  Gastrointestinal: Negative for nausea, vomiting and diarrhea.       Acid reflux at bedtime  Musculoskeletal: Positive for back pain, arthralgias and gait problem.  Skin: Negative for color change, pallor, rash and wound.  Neurological: Positive for headaches. Negative for weakness.  Psychiatric/Behavioral: Positive for sleep disturbance and dysphoric mood.       Objective:   Physical Exam  Constitutional: She is oriented to person, place, and time. She appears well-developed and well-nourished. No distress.  HENT:  Head: Normocephalic and atraumatic.  Right Ear: External ear normal.  Left Ear: External ear normal.  Nose: Nose normal.  Eyes: Conjunctivae and EOM are normal.  Neck: Neck supple. No thyromegaly present.  Cardiovascular: Normal rate, regular rhythm and normal heart sounds.   Pulmonary/Chest: Effort normal and breath sounds normal.  Lymphadenopathy:    She has no cervical adenopathy.  Neurological: She is alert and oriented to person, place, and time.  Skin: Skin is warm and dry. She is not diaphoretic.  Psychiatric: She has a normal mood and affect. Her behavior is normal. Judgment and thought content normal.          Assessment & Plan:

## 2013-01-03 NOTE — Assessment & Plan Note (Signed)
Lab Results  Component Value Date   HGBA1C 9.0 01/03/2013   HGBA1C 9.2 09/01/2012   HGBA1C 7.1* 12/22/2011     Assessment:  Diabetes control: poor control (HgbA1C >9%)  Progress toward A1C goal:  unchanged  Comments: She had her meter today but it cannot be downloaded. She checks her CBG TID. About 150 in AM. No lows - the lowest was 87. Her eyes are OK - last exam was 6/13.   Plan:  Medications:  continue current medications  Home glucose monitoring:   Frequency: 3 times a day   Timing: before meals  Instruction/counseling given: other instruction/counseling: Check CBG TID and report to Lupita Leash the levels so that we can adjust meds.  Educational resources provided: brochure  Self management tools provided: home glucose logbook  Other plans: Will ask Lupita Leash to call later in week to get CBG's. Pneumovax today, All other indices UTD.

## 2013-01-03 NOTE — Assessment & Plan Note (Signed)
B12 was documented low 3 yrs ago. Was on B12 monthly until Dr Lonzo Cloud told her to stop. Pt thinks she did better on the injection. I told her to cont since she did have a documented low and there is no reversible cause. Therefore, if she stops B12 eventually when will get low again.

## 2013-01-03 NOTE — Assessment & Plan Note (Signed)
[name] [name] verified her CAD hx. She is on a BB, ASA, and statin. She is asymptomatic although her activity is limited by pain. She is able to walk less than one block without pain. However, when she goes to store, if she has a buggy, then she can walk around store. She is also on NTG prn and Imdur.

## 2013-01-03 NOTE — Patient Instructions (Addendum)
General Instructions: 1. Come back monthly for Vitamin B 12 shot 2. See me in 3 months 3. Lupita Leash will call you Friday morning 4. Try ranitidine for your heartburn. Take it in the evening. If you still get heartburn, take it twice a day. If still getting heartburn, please call me.    Treatment Goals:  Goals (1 Years of Data) as of 01/03/13         As of Today 09/01/12 07/27/12 02/23/12 02/02/12     Blood Pressure    . Blood Pressure < 140/90  159/71 137/69 120/64 145/63 137/73     Result Component    . HEMOGLOBIN A1C < 7.0  9.0 9.2       . LDL CALC < 100   70         Progress Toward Treatment Goals:  Treatment Goal 01/03/2013  Hemoglobin A1C unchanged  Blood pressure unchanged    Self Care Goals & Plans:  Self Care Goal 01/03/2013  Manage my medications take my medicines as prescribed; refill my medications on time  Monitor my health keep track of my blood glucose  Eat healthy foods eat baked foods instead of fried foods; eat foods that are low in salt    Home Blood Glucose Monitoring 01/03/2013  Check my blood sugar 3 times a day  When to check my blood sugar before meals     Care Management & Community Referrals:  Referral 01/03/2013  Referrals made for care management support diabetes educator  Referrals made to community resources other (see comments)

## 2013-01-03 NOTE — Assessment & Plan Note (Signed)
She first stated that her depression was well controlled on the paxil Then stated that sometimes just wants to be left alone. Her sig other even irritates her. Has missed church 2/2 depression. She does enjoy activities and does go out shopping.

## 2013-01-03 NOTE — Assessment & Plan Note (Signed)
She uses Mobic and has used ultram gel in past that worked well but ran out. I refilled it. Only able to walk less than one block before pain limits ambulation. Can walk longer if has buggy for support. Uses cane outside of home.

## 2013-01-03 NOTE — Assessment & Plan Note (Signed)
Uses Ambien and works sometimes.

## 2013-01-04 NOTE — Assessment & Plan Note (Signed)
I need to F/U on this at her next visit. Seems to be taking D3 and D2. Has documented low Vit D levels.

## 2013-01-04 NOTE — Assessment & Plan Note (Signed)
She takes B12 injections and iron pills. Follow HgB.

## 2013-01-10 ENCOUNTER — Ambulatory Visit (INDEPENDENT_AMBULATORY_CARE_PROVIDER_SITE_OTHER): Payer: PRIVATE HEALTH INSURANCE | Admitting: Cardiology

## 2013-01-10 ENCOUNTER — Encounter: Payer: Self-pay | Admitting: Cardiology

## 2013-01-10 VITALS — BP 156/88 | HR 58 | Ht 62.0 in | Wt 230.0 lb

## 2013-01-10 DIAGNOSIS — E785 Hyperlipidemia, unspecified: Secondary | ICD-10-CM

## 2013-01-10 DIAGNOSIS — I2510 Atherosclerotic heart disease of native coronary artery without angina pectoris: Secondary | ICD-10-CM

## 2013-01-10 DIAGNOSIS — I10 Essential (primary) hypertension: Secondary | ICD-10-CM

## 2013-01-10 NOTE — Patient Instructions (Addendum)
Your physician wants you to follow-up in: 6 MONTHS with Dr Clifton James (previous pt of Dr Riley Kill). You will receive a reminder letter in the mail two months in advance. If you don't receive a letter, please call our office to schedule the follow-up appointment.  Your physician recommends that you continue on your current medications as directed. Please refer to the Current Medication list given to you today.

## 2013-01-11 ENCOUNTER — Ambulatory Visit: Payer: PRIVATE HEALTH INSURANCE | Admitting: Cardiology

## 2013-01-11 NOTE — Progress Notes (Signed)
HPI:  This very nice patient says she is getting along extremely well. She denies any chest pain. She is very pleased with her current general medical followup in the internal medicine clinic. She has no specific complaints today, but presents primarily for followup  Current Outpatient Prescriptions  Medication Sig Dispense Refill  . Alcohol Swabs (ALCOHOL PREP) 70 % PADS       . amLODipine (NORVASC) 5 MG tablet Take 1 tablet (5 mg total) by mouth daily.  30 tablet  11  . aspirin 81 MG tablet Take 81 mg by mouth daily.        [name] atenolol (TENORMIN) 100 MG tablet Take 0.5 tablets (50 mg total) by mouth daily.  45 tablet  3  . atorvastatin (LIPITOR) 20 MG tablet take 1 tablet by mouth once daily  30 tablet  11  . benazepril (LOTENSIN) 40 MG tablet Take 1 tablet (40 mg total) by mouth daily.  90 tablet  3  . calcium carbonate (OS-CAL) 600 MG TABS Take 1 tablet (600 mg total) by mouth 2 (two) times daily with a meal.  60 tablet  6  . Cholecalciferol (VITAMIN D3) 1000 UNITS tablet Take 1 tablet (1,000 Units total) by mouth daily.  30 tablet  1  . CLEVER CHEK AUTO-CODE VOICE test strip USE 3 TIMES DAILY FOR DIABETIC TESTING  300 each  4  . cyanocobalamin (,VITAMIN B-12,) 1000 MCG/ML injection Inject 1 mL (1,000 mcg total) into the muscle every 30 (thirty) days.  10 mL  6  . diclofenac sodium (VOLTAREN) 1 % GEL Apply 4 g topically 4 (four) times daily. Apply small amount to affected area up to 4 times a day.  100 g  5  . ergocalciferol (VITAMIN D2) 50000 UNITS capsule Take 1 capsule (50,000 Units total) by mouth once a week.  4 capsule  12  . ferrous sulfate 325 (65 FE) MG tablet Take 1 tablet (325 mg total) by mouth 2 (two) times daily with a meal.  60 tablet  2  . furosemide (LASIX) 40 MG tablet Take 1 tablet (40 mg total) by mouth daily.  30 tablet  5  . gabapentin (NEURONTIN) 400 MG capsule Take 1 capsule (400 mg total) by mouth 3 (three) times daily.  90 capsule  2  . insulin glargine (LANTUS  SOLOSTAR) 100 UNIT/ML injection Inject 20 Units into the skin at bedtime. Dx code 250.00.  10 mL  5  . Insulin Pen Needle (B-D UF III MINI PEN NEEDLES) 31G X 5 MM MISC Use to Inject Insulin One Time a Day  100 each  3  . Insulin Pen Needle (PEN NEEDLES) 31G X 6 MM MISC       . isosorbide mononitrate (IMDUR) 30 MG 24 hr tablet Take 1 tablet (30 mg total) by mouth daily.  90 tablet  3  . Lancets MISC USE 3 TIMES DAILY FOR DIABETIC TESTING  300 each  4  . Magnesium Chloride (MAG-DELAY) 535 (64 MG) MG TBCR Take 1 tablet (64 mg total) by mouth 2 (two) times daily.  60 tablet  3  . meloxicam (MOBIC) 7.5 MG tablet Take 1 tablet (7.5 mg total) by mouth daily.  30 tablet  4  . metFORMIN (GLUCOPHAGE) 1000 MG tablet Take 1 tablet (1,000 mg total) by mouth 2 (two) times daily with a meal.  180 tablet  2  . NEEDLE, DISP, 30 G 30G X 1/2" MISC       . nitroGLYCERIN (  NITROSTAT) 0.4 MG SL tablet Place 1 tablet (0.4 mg total) under the tongue every 5 (five) minutes as needed for chest pain.  25 tablet  2  . PARoxetine (PAXIL) 40 MG tablet Take 1 tablet (40 mg total) by mouth every morning.  30 tablet  5  . ranitidine (ZANTAC) 150 MG tablet Take 1 tablet (150 mg total) by mouth 2 (two) times daily. Try once a day in the evening. If not effective, increase to one pill twice a day  60 tablet  5  . SYRINGE-NEEDLE, DISP, 3 ML 23G X 3/4" 3 ML MISC Use to inject b-12.  10 each  6  . zolpidem (AMBIEN CR) 6.25 MG CR tablet Take 1 tablet (6.25 mg total) by mouth at bedtime as needed for sleep.  30 tablet  0   No current facility-administered medications for this visit.    No Known Allergies  Past Medical History  Diagnosis Date  . CAD 08/26/2006    Cath 07/05:multiple areas of nonobstructive disease = medical & RF mgmt, well preserved global systolic function. Dr. Riley Kill      . DIABETES MELLITUS, TYPE II 08/26/2006    Insulin dependent    . HYPERTENSION 08/26/2006    On BB, ACEI, lasix, and imdur.     . Chronic  pain syndrome 01/03/2013    Multifactorial. Non opioid requiring.   L2-5 fusion, with hardware at L2-3, stable per MRI 2010. Followed by neurosurgery (Dr. 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    [name] DEPRESSION 08/26/2006    On paxil    . DYSLIPIDEMIA 11/01/2006    LDL goal < 100, 70 if can achieve without side effects.     . OBESITY 08/26/2006    BMI 39.5. Obesity Class 2.     . GERD 08/26/2006    Heartburn QHS.       Past Surgical History  Procedure Laterality Date  . Posterior lumbar interfusion surgery  07/18/07    L2-L3  . Lumbar fusion surgery  10/08    L3-L5  . Rotator cuff surgery    . Cholecystectomy    . Endometrial biospy      Family History  Problem Relation Age of Onset  . Hypertension Sister   . Hodgkin's lymphoma Sister     History   Social History  . Marital Status: Single    Spouse Name: N/A    Number of Children: N/A  . Years of Education: N/A   Occupational History  . Not on file.   Social History Main Topics  . Smoking status: Never Smoker   . Smokeless tobacco: Never Used  . Alcohol Use: No  . Drug Use: No  . Sexually Active: Not on file   Other Topics Concern  . Not on file   Social History Narrative  . No narrative on file    ROS: Please see the HPI.  All other systems reviewed and negative.  PHYSICAL EXAM:  BP 156/88  Pulse 58  Ht 5\' 2"  (1.575 m)  Wt 230 lb (104.327 kg)  BMI 42.06 kg/m2  SpO2 96%  General: Well developed, well nourished, in no acute distress. Head:  Normocephalic and atraumatic. Neck: no JVD Lungs: Clear to auscultation and percussion. Heart: Normal S1 and S2.  No murmur, rubs or gallops.  Abdomen:  Normal bowel sounds; soft; non tender; no organomegaly Pulses: Pulses normal in all 4 extremities. Extremities: No clubbing or cyanosis.  No edema. Neurologic: Alert and oriented x 3.  EKG:  SB.  Otherwise normal tracing.    ASSESSMENT AND  PLAN:  1.  CAD  -  Currently stable with no new issues.  2.  Hyperlipidemia on atorvastatin 3.  IDDM 4.  Hypertension  Continued fu with Dr. Clifton James in 6 months for meet and greet visit.

## 2013-01-15 NOTE — Assessment & Plan Note (Addendum)
Currently stable.  No new issues. Arrange fu visit with Dr. Clifton James

## 2013-01-15 NOTE — Assessment & Plan Note (Signed)
Recent follow up LDL at 70mg /dl.  Followed in medical clinic

## 2013-01-15 NOTE — Assessment & Plan Note (Signed)
Followed in gen med clinic.  Borderline control.  Continue the same at present.

## 2013-01-23 ENCOUNTER — Other Ambulatory Visit: Payer: Self-pay | Admitting: *Deleted

## 2013-01-23 MED ORDER — ATORVASTATIN CALCIUM 20 MG PO TABS: 20.0000 mg | ORAL_TABLET | Freq: Every day | ORAL | Status: DC

## 2013-02-03 ENCOUNTER — Ambulatory Visit (INDEPENDENT_AMBULATORY_CARE_PROVIDER_SITE_OTHER): Payer: PRIVATE HEALTH INSURANCE | Admitting: *Deleted

## 2013-02-03 DIAGNOSIS — E538 Deficiency of other specified B group vitamins: Secondary | ICD-10-CM

## 2013-02-03 MED ORDER — CYANOCOBALAMIN 1000 MCG/ML IJ SOLN
1000.0000 ug | Freq: Once | INTRAMUSCULAR | Status: AC
  Administered 2013-02-03: 1000 ug via INTRAMUSCULAR

## 2013-02-06 ENCOUNTER — Telehealth: Payer: Self-pay | Admitting: *Deleted

## 2013-02-06 NOTE — Telephone Encounter (Signed)
Entered vaccine info

## 2013-02-28 ENCOUNTER — Other Ambulatory Visit: Payer: Self-pay | Admitting: Internal Medicine

## 2013-02-28 ENCOUNTER — Other Ambulatory Visit: Payer: Self-pay | Admitting: *Deleted

## 2013-02-28 DIAGNOSIS — I2510 Atherosclerotic heart disease of native coronary artery without angina pectoris: Secondary | ICD-10-CM

## 2013-02-28 DIAGNOSIS — I10 Essential (primary) hypertension: Secondary | ICD-10-CM

## 2013-02-28 MED ORDER — AMLODIPINE BESYLATE 5 MG PO TABS: 5.0000 mg | ORAL_TABLET | Freq: Every day | ORAL | Status: DC

## 2013-02-28 NOTE — Telephone Encounter (Signed)
Pls sch June or July appt Thanks

## 2013-03-06 ENCOUNTER — Other Ambulatory Visit: Payer: Self-pay | Admitting: Internal Medicine

## 2013-03-07 ENCOUNTER — Ambulatory Visit: Payer: PRIVATE HEALTH INSURANCE

## 2013-03-07 NOTE — Telephone Encounter (Signed)
Pls sch June or later appt with me. Thanks

## 2013-03-08 ENCOUNTER — Ambulatory Visit (INDEPENDENT_AMBULATORY_CARE_PROVIDER_SITE_OTHER): Payer: PRIVATE HEALTH INSURANCE | Admitting: *Deleted

## 2013-03-08 DIAGNOSIS — E538 Deficiency of other specified B group vitamins: Secondary | ICD-10-CM

## 2013-03-08 MED ORDER — CYANOCOBALAMIN 1000 MCG/ML IJ SOLN
1000.0000 ug | Freq: Once | INTRAMUSCULAR | Status: AC
  Administered 2013-03-08: 1000 ug via INTRAMUSCULAR

## 2013-03-15 ENCOUNTER — Other Ambulatory Visit: Payer: Self-pay | Admitting: *Deleted

## 2013-03-15 MED ORDER — FERROUS SULFATE 325 (65 FE) MG PO TABS: 325.0000 mg | ORAL_TABLET | Freq: Two times a day (BID) | ORAL | Status: DC

## 2013-03-28 ENCOUNTER — Other Ambulatory Visit: Payer: Self-pay | Admitting: *Deleted

## 2013-03-28 DIAGNOSIS — I10 Essential (primary) hypertension: Secondary | ICD-10-CM

## 2013-03-28 MED ORDER — ATENOLOL 100 MG PO TABS: 50.0000 mg | ORAL_TABLET | Freq: Every day | ORAL | Status: DC

## 2013-04-07 ENCOUNTER — Ambulatory Visit (INDEPENDENT_AMBULATORY_CARE_PROVIDER_SITE_OTHER): Payer: PRIVATE HEALTH INSURANCE | Admitting: *Deleted

## 2013-04-07 DIAGNOSIS — E538 Deficiency of other specified B group vitamins: Secondary | ICD-10-CM

## 2013-04-07 MED ORDER — CYANOCOBALAMIN 1000 MCG/ML IJ SOLN
1000.0000 ug | Freq: Once | INTRAMUSCULAR | Status: AC
  Administered 2013-04-07: 1000 ug via INTRAMUSCULAR

## 2013-04-20 ENCOUNTER — Other Ambulatory Visit: Payer: Self-pay

## 2013-04-20 ENCOUNTER — Other Ambulatory Visit: Payer: Self-pay | Admitting: *Deleted

## 2013-04-20 DIAGNOSIS — E119 Type 2 diabetes mellitus without complications: Secondary | ICD-10-CM

## 2013-04-20 MED ORDER — METFORMIN HCL 1000 MG PO TABS: 1000.0000 mg | ORAL_TABLET | Freq: Two times a day (BID) | ORAL | Status: DC

## 2013-04-25 ENCOUNTER — Encounter: Payer: Self-pay | Admitting: Internal Medicine

## 2013-04-25 ENCOUNTER — Ambulatory Visit (INDEPENDENT_AMBULATORY_CARE_PROVIDER_SITE_OTHER): Payer: PRIVATE HEALTH INSURANCE | Admitting: Internal Medicine

## 2013-04-25 VITALS — BP 140/70 | HR 75 | Temp 98.3°F | Ht 62.0 in | Wt 231.1 lb

## 2013-04-25 DIAGNOSIS — M79602 Pain in left arm: Secondary | ICD-10-CM

## 2013-04-25 DIAGNOSIS — E119 Type 2 diabetes mellitus without complications: Secondary | ICD-10-CM

## 2013-04-25 DIAGNOSIS — M79609 Pain in unspecified limb: Secondary | ICD-10-CM

## 2013-04-25 DIAGNOSIS — R609 Edema, unspecified: Secondary | ICD-10-CM

## 2013-04-25 DIAGNOSIS — Z0000 Encounter for general adult medical examination without abnormal findings: Secondary | ICD-10-CM

## 2013-04-25 DIAGNOSIS — M949 Disorder of cartilage, unspecified: Secondary | ICD-10-CM

## 2013-04-25 DIAGNOSIS — K219 Gastro-esophageal reflux disease without esophagitis: Secondary | ICD-10-CM

## 2013-04-25 DIAGNOSIS — D649 Anemia, unspecified: Secondary | ICD-10-CM

## 2013-04-25 DIAGNOSIS — E785 Hyperlipidemia, unspecified: Secondary | ICD-10-CM

## 2013-04-25 DIAGNOSIS — I2510 Atherosclerotic heart disease of native coronary artery without angina pectoris: Secondary | ICD-10-CM

## 2013-04-25 DIAGNOSIS — F329 Major depressive disorder, single episode, unspecified: Secondary | ICD-10-CM

## 2013-04-25 DIAGNOSIS — E559 Vitamin D deficiency, unspecified: Secondary | ICD-10-CM

## 2013-04-25 DIAGNOSIS — F32A Depression, unspecified: Secondary | ICD-10-CM

## 2013-04-25 DIAGNOSIS — G4700 Insomnia, unspecified: Secondary | ICD-10-CM

## 2013-04-25 DIAGNOSIS — M899 Disorder of bone, unspecified: Secondary | ICD-10-CM

## 2013-04-25 DIAGNOSIS — I10 Essential (primary) hypertension: Secondary | ICD-10-CM

## 2013-04-25 DIAGNOSIS — M159 Polyosteoarthritis, unspecified: Secondary | ICD-10-CM

## 2013-04-25 DIAGNOSIS — G894 Chronic pain syndrome: Secondary | ICD-10-CM

## 2013-04-25 DIAGNOSIS — E669 Obesity, unspecified: Secondary | ICD-10-CM

## 2013-04-25 DIAGNOSIS — E538 Deficiency of other specified B group vitamins: Secondary | ICD-10-CM

## 2013-04-25 DIAGNOSIS — M1990 Unspecified osteoarthritis, unspecified site: Secondary | ICD-10-CM

## 2013-04-25 LAB — POCT GLYCOSYLATED HEMOGLOBIN (HGB A1C): Hemoglobin A1C: 8.6

## 2013-04-25 LAB — GLUCOSE, CAPILLARY: Glucose-Capillary: 110 mg/dL — ABNORMAL HIGH (ref 70–99)

## 2013-04-25 MED ORDER — FERROUS SULFATE 325 (65 FE) MG PO TABS: 325.0000 mg | ORAL_TABLET | Freq: Two times a day (BID) | ORAL | Status: DC

## 2013-04-25 MED ORDER — CETAPHIL MOISTURIZING EX CREA: TOPICAL_CREAM | CUTANEOUS | Status: DC | PRN

## 2013-04-25 MED ORDER — DICLOFENAC SODIUM 1 % TD GEL: 4.0000 g | Freq: Four times a day (QID) | TRANSDERMAL | Status: DC

## 2013-04-25 MED ORDER — PAROXETINE HCL 40 MG PO TABS: 40.0000 mg | ORAL_TABLET | ORAL | Status: DC

## 2013-04-25 MED ORDER — AMLODIPINE BESYLATE 5 MG PO TABS: 5.0000 mg | ORAL_TABLET | Freq: Every day | ORAL | Status: DC

## 2013-04-25 MED ORDER — ATORVASTATIN CALCIUM 20 MG PO TABS: 20.0000 mg | ORAL_TABLET | Freq: Every day | ORAL | Status: DC

## 2013-04-25 MED ORDER — ERGOCALCIFEROL 1.25 MG (50000 UT) PO CAPS: 50000.0000 [IU] | ORAL_CAPSULE | ORAL | Status: DC

## 2013-04-25 MED ORDER — GABAPENTIN 400 MG PO CAPS: ORAL_CAPSULE | ORAL | Status: DC

## 2013-04-25 MED ORDER — MELOXICAM 7.5 MG PO TABS: 7.5000 mg | ORAL_TABLET | Freq: Every day | ORAL | Status: DC | PRN

## 2013-04-25 MED ORDER — BENAZEPRIL HCL 40 MG PO TABS: ORAL_TABLET | ORAL | Status: DC

## 2013-04-25 MED ORDER — ISOSORBIDE MONONITRATE ER 30 MG PO TB24: 30.0000 mg | ORAL_TABLET | Freq: Every day | ORAL | Status: DC

## 2013-04-25 MED ORDER — INSULIN GLARGINE 100 UNIT/ML SOLOSTAR PEN: 20.0000 [IU] | PEN_INJECTOR | Freq: Every day | SUBCUTANEOUS | Status: DC

## 2013-04-25 MED ORDER — CYANOCOBALAMIN 1000 MCG PO CAPS: 1000.0000 ug | ORAL_CAPSULE | Freq: Every day | ORAL | Status: DC

## 2013-04-25 MED ORDER — ATENOLOL 100 MG PO TABS: 50.0000 mg | ORAL_TABLET | Freq: Every day | ORAL | Status: DC

## 2013-04-25 MED ORDER — FUROSEMIDE 40 MG PO TABS: 40.0000 mg | ORAL_TABLET | Freq: Every day | ORAL | Status: DC

## 2013-04-25 MED ORDER — RANITIDINE HCL 150 MG PO TABS: 150.0000 mg | ORAL_TABLET | Freq: Two times a day (BID) | ORAL | Status: DC

## 2013-04-25 MED ORDER — ASPIRIN 81 MG PO TABS: 81.0000 mg | ORAL_TABLET | Freq: Every day | ORAL | Status: AC

## 2013-04-25 MED ORDER — CYANOCOBALAMIN 1000 MCG/ML IJ SOLN
1000.0000 ug | Freq: Once | INTRAMUSCULAR | Status: AC
  Administered 2013-04-25: 1000 ug via INTRAMUSCULAR

## 2013-04-25 MED ORDER — METFORMIN HCL 1000 MG PO TABS: 1000.0000 mg | ORAL_TABLET | Freq: Two times a day (BID) | ORAL | Status: DC

## 2013-04-25 MED ORDER — MAGNESIUM CHLORIDE ER 535 (64 MG) MG PO TBCR: 1.0000 | EXTENDED_RELEASE_TABLET | Freq: Two times a day (BID) | ORAL | Status: DC

## 2013-04-25 MED ORDER — CALCIUM CARBONATE 600 MG PO TABS: 600.0000 mg | ORAL_TABLET | Freq: Two times a day (BID) | ORAL | Status: DC

## 2013-04-25 MED ORDER — ZOLPIDEM TARTRATE ER 6.25 MG PO TBCR: 6.2500 mg | EXTENDED_RELEASE_TABLET | Freq: Every evening | ORAL | Status: DC | PRN

## 2013-04-25 NOTE — Assessment & Plan Note (Signed)
Asymptomatic. On ASA, statin, and BB. Sees [facility] cards.

## 2013-04-25 NOTE — Assessment & Plan Note (Signed)
She is on Vit D2 50,000 units weekly. Doesn't take the D3 daily. Therefore, will check Vit D level to ensure not too high.

## 2013-04-25 NOTE — Assessment & Plan Note (Signed)
.   Lab Results  Component Value Date   HGBA1C 8.6 04/25/2013   HGBA1C 9.0 01/03/2013   HGBA1C 9.2 09/01/2012   She checks her CBG three times a day and brought her log. Ranges from 116 to < 200. Several AM's are in 116 range. Her A1C is trending down but slowly. On max metformin and 20 lantus pen. We discussed increasing diet (she knows what to do) and seated exercises versus adding once daily prandial novolog and she wanted to try diet and exercise. Will recheck in three months and if no better, add novolog.   UTD on all DM screening.

## 2013-04-25 NOTE — Assessment & Plan Note (Signed)
She states that the Zantac does great and only needs to take it PRN!

## 2013-04-25 NOTE — Assessment & Plan Note (Signed)
We reviewed her 5 yr weight curve. Praised her for NOT gaining weight but she understands she needs to lose weight.

## 2013-04-25 NOTE — Assessment & Plan Note (Signed)
LDL 70 in Nov. Great control on statin.

## 2013-04-25 NOTE — Assessment & Plan Note (Signed)
UTD on all measures.   Rash on breast doesn't have classic appearance of candida. My concerns are psoriasis (there were scales and there is a second peak in her age group but weird location and knees were clear), tinea (but not the classic appearance), or lichen (on several meds assoc but no discrete margins). Will start with just emollient and if no better two weeks, trial of antifungal.

## 2013-04-25 NOTE — Progress Notes (Signed)
Zolpidem 6.25 cr called to pharm

## 2013-04-25 NOTE — Assessment & Plan Note (Signed)
She only gets out to church and MD appt 2/2 pain from back. This doesn't bother her. Doing well on Paxil

## 2013-04-25 NOTE — Assessment & Plan Note (Signed)
BP Readings from Last 3 Encounters:  04/25/13 140/70  01/10/13 156/88  01/03/13 143/68    Good control. She checks it at home but thought that high was 190 / 60. So told her goal was 140/90 of less.

## 2013-04-25 NOTE — Assessment & Plan Note (Signed)
She sleeps very poorly. She goes to bed at 7:30 PM and tosses and turns until she falls asleep at 1 AM. She wakes up at 4-6 AM. She is tired during the day but never naps. Never dozes off in front of TV. She does snore but sleep study 2013 was negative. Uses Ambien PRN and does well.

## 2013-04-25 NOTE — Patient Instructions (Addendum)
1. Will give you B12 shot today. Then take the pill every day. You may call me if the pills do not work. 2. Try to alter your diet and exercise as you can to get your sugar lower. 3. See me in three months 4. Try the cream for 2 weeks. If no better, call me 5. I will get blood work and will call you with the results

## 2013-04-25 NOTE — Assessment & Plan Note (Signed)
Since I am getting blood today and the most recent HgB had decreased just a bit, I will check a HgB. Lab Results  Component Value Date   HGB 11.7* 07/27/2012

## 2013-04-25 NOTE — Assessment & Plan Note (Signed)
Will give B12 shot today. Then she wants to try pills so sent to pharmacy. Told her if doesn't do well on pills, she can call me and I will Rx it so she can inject it at home since she did that in past.

## 2013-04-25 NOTE — Assessment & Plan Note (Signed)
Cont to take Ca and Vit D. Checking Vit D level.

## 2013-04-25 NOTE — Progress Notes (Signed)
  Subjective:    Patient ID: [name] [name], female    DOB: 10-19-1945, 67 y.o.   MRN: [id number]  Diabetes Pertinent negatives for hypoglycemia include no dizziness. Pertinent negatives for diabetes include no chest pain.  Back Pain Pertinent negatives include no chest pain.    Please see the A&P for the status of the pt's chronic medical problems.   Review of Systems  Constitutional: Negative for activity change and appetite change.  Respiratory: Negative for shortness of breath.   Cardiovascular: Positive for leg swelling. Negative for chest pain.  Endocrine: Positive for heat intolerance.  Musculoskeletal: Positive for back pain.  Skin: Positive for rash.  Neurological: Negative for dizziness and light-headedness.  Psychiatric/Behavioral: Positive for sleep disturbance. Negative for dysphoric mood.       Objective:   Physical Exam  Constitutional: She is oriented to person, place, and time. She appears well-developed and well-nourished. No distress.  HENT:  Head: Normocephalic and atraumatic.  Right Ear: External ear normal.  Left Ear: External ear normal.  Nose: Nose normal.  Mouth/Throat: Oropharynx is clear and moist. No oropharyngeal exudate.  Eyes: Conjunctivae and EOM are normal.  Neck: Normal range of motion. Neck supple.  Cardiovascular: Normal rate, regular rhythm and normal heart sounds.   No carotid bruits  Pulmonary/Chest: Effort normal and breath sounds normal.  Musculoskeletal: Normal range of motion. She exhibits edema. She exhibits no tenderness.  Neurological: She is alert and oriented to person, place, and time.  Skin: Skin is warm and dry. She is not diaphoretic.  On ant chest onto B breasts and L shoulder, there are patches of almost silver, scaly patches that show scratching. No yeast under breast in skin folds. No yeast odor.   Psychiatric: She has a normal mood and affect. Her behavior is normal. Judgment and thought content normal.           Assessment & Plan:

## 2013-04-25 NOTE — Assessment & Plan Note (Signed)
C/o chronic, unchanged back pain. Had to stop and sit three times from front entrance to clinic. Uses Cane. Uses Mobic prn (knows not to take QD) and ultram gel.

## 2013-04-26 ENCOUNTER — Other Ambulatory Visit: Payer: Self-pay | Admitting: Internal Medicine

## 2013-04-26 DIAGNOSIS — E559 Vitamin D deficiency, unspecified: Secondary | ICD-10-CM

## 2013-04-26 LAB — CBC
HCT: 38.1 % (ref 36.0–46.0)
Hemoglobin: 12.4 g/dL (ref 12.0–15.0)
MCH: 28.4 pg (ref 26.0–34.0)
MCHC: 32.5 g/dL (ref 30.0–36.0)
MCV: 87.2 fL (ref 78.0–100.0)
Platelets: 318 10*3/uL (ref 150–400)
RBC: 4.37 MIL/uL (ref 3.87–5.11)
RDW: 15.8 % — ABNORMAL HIGH (ref 11.5–15.5)
WBC: 7.4 10*3/uL (ref 4.0–10.5)

## 2013-04-26 LAB — MAGNESIUM: Magnesium: 1.2 mg/dL — ABNORMAL LOW (ref 1.5–2.5)

## 2013-04-26 LAB — BASIC METABOLIC PANEL WITH GFR
BUN: 18 mg/dL (ref 6–23)
CO2: 28 mEq/L (ref 19–32)
Calcium: 9.3 mg/dL (ref 8.4–10.5)
Chloride: 101 mEq/L (ref 96–112)
Creat: 0.68 mg/dL (ref 0.50–1.10)
GFR, Est African American: >89 mL/min
GFR, Est Non African American: >89 mL/min
Glucose, Bld: 112 mg/dL — ABNORMAL HIGH (ref 70–99)
Potassium: 4.6 mEq/L (ref 3.5–5.3)
Sodium: 140 mEq/L (ref 135–145)

## 2013-04-26 LAB — VITAMIN B12: Vitamin B-12: >2000 pg/mL — ABNORMAL HIGH (ref 211–911)

## 2013-04-26 LAB — VITAMIN D 25 HYDROXY (VIT D DEFICIENCY, FRACTURES): Vit D, 25-Hydroxy: 45 ng/mL (ref 30–89)

## 2013-04-26 MED ORDER — VITAMIN D3 25 MCG (1000 UNIT) PO TABS: 1000.0000 [IU] | ORAL_TABLET | Freq: Every day | ORAL | Status: DC

## 2013-05-08 ENCOUNTER — Ambulatory Visit: Payer: PRIVATE HEALTH INSURANCE

## 2013-05-24 ENCOUNTER — Other Ambulatory Visit: Payer: Self-pay | Admitting: Internal Medicine

## 2013-05-24 DIAGNOSIS — Z1231 Encounter for screening mammogram for malignant neoplasm of breast: Secondary | ICD-10-CM

## 2013-06-19 ENCOUNTER — Ambulatory Visit (HOSPITAL_COMMUNITY)
Admission: RE | Admit: 2013-06-19 | Discharge: 2013-06-19 | Disposition: A | Discharge status: Home / Self Care | Payer: PRIVATE HEALTH INSURANCE | Source: Ambulatory Visit | Attending: Internal Medicine | Admitting: Internal Medicine

## 2013-06-19 DIAGNOSIS — Z1231 Encounter for screening mammogram for malignant neoplasm of breast: Secondary | ICD-10-CM | POA: Insufficient documentation

## 2013-07-25 ENCOUNTER — Ambulatory Visit (INDEPENDENT_AMBULATORY_CARE_PROVIDER_SITE_OTHER): Payer: PRIVATE HEALTH INSURANCE | Admitting: Internal Medicine

## 2013-07-25 ENCOUNTER — Encounter: Payer: Self-pay | Admitting: Internal Medicine

## 2013-07-25 VITALS — BP 144/72 | HR 71 | Temp 97.4°F | Ht 61.5 in | Wt 235.9 lb

## 2013-07-25 DIAGNOSIS — G4700 Insomnia, unspecified: Secondary | ICD-10-CM

## 2013-07-25 DIAGNOSIS — E785 Hyperlipidemia, unspecified: Secondary | ICD-10-CM

## 2013-07-25 DIAGNOSIS — E119 Type 2 diabetes mellitus without complications: Secondary | ICD-10-CM

## 2013-07-25 DIAGNOSIS — I10 Essential (primary) hypertension: Secondary | ICD-10-CM

## 2013-07-25 DIAGNOSIS — I2510 Atherosclerotic heart disease of native coronary artery without angina pectoris: Secondary | ICD-10-CM

## 2013-07-25 DIAGNOSIS — Z0000 Encounter for general adult medical examination without abnormal findings: Secondary | ICD-10-CM

## 2013-07-25 DIAGNOSIS — E669 Obesity, unspecified: Secondary | ICD-10-CM

## 2013-07-25 DIAGNOSIS — F329 Major depressive disorder, single episode, unspecified: Secondary | ICD-10-CM

## 2013-07-25 DIAGNOSIS — K219 Gastro-esophageal reflux disease without esophagitis: Secondary | ICD-10-CM

## 2013-07-25 DIAGNOSIS — G894 Chronic pain syndrome: Secondary | ICD-10-CM

## 2013-07-25 DIAGNOSIS — F3289 Other specified depressive episodes: Secondary | ICD-10-CM

## 2013-07-25 DIAGNOSIS — Z23 Encounter for immunization: Secondary | ICD-10-CM

## 2013-07-25 LAB — POCT GLYCOSYLATED HEMOGLOBIN (HGB A1C): Hemoglobin A1C: 7.7

## 2013-07-25 LAB — GLUCOSE, CAPILLARY: Glucose-Capillary: 143 mg/dL — ABNORMAL HIGH (ref 70–99)

## 2013-07-25 MED ORDER — LORATADINE 10 MG PO CAPS: 10.0000 mg | ORAL_CAPSULE | Freq: Every day | ORAL | Status: DC

## 2013-07-25 NOTE — Assessment & Plan Note (Signed)
Has great control of her LDL and cont on her lipitor. Will need FLP next visit.

## 2013-07-25 NOTE — Assessment & Plan Note (Signed)
BP is at goal. Cont meds of norvasc 10, atenolol 100, benazepril 40, lasix 40, and imdur 30.   BP Readings from Last 3 Encounters:  07/25/13 144/72  04/25/13 140/70  01/10/13 156/88    Lab Results  Component Value Date   NA 140 04/25/2013   K 4.6 04/25/2013   CREATININE 0.68 04/25/2013    Assessment: Blood pressure control: controlled Progress toward BP goal:  at goal Comments: See above  Plan: Medications:  continue current medications Educational resources provided: brochure Self management tools provided: home blood pressure logbook Other plans: See above

## 2013-07-25 NOTE — Assessment & Plan Note (Signed)
Cont on her H2B.

## 2013-07-25 NOTE — Assessment & Plan Note (Signed)
Last level was low. Verified with pt that she was taking med BID. Will recheck next blood draw.

## 2013-07-25 NOTE — Assessment & Plan Note (Signed)
She states that she is taking her ASA, BB, and statin. Denies CP today.

## 2013-07-25 NOTE — Assessment & Plan Note (Addendum)
She checks her CBG before meals. Ranges from 71 to 223. Eyeballing them, the average does seem to correspond to her A1C. She was successful in decreasing her A1C from 9.2 - 9.0 - 8.6 - 7.7. Due to he age and other RF, I am satisfied with an A1C of <8. She states that she had one low of 68 but was symptomatic. Her only other "low" was 98. Therefore, cont her metformin 1000 BID and lantus 20 QD. She may return in 3-6 months.  Lab Results  Component Value Date   HGBA1C 7.7 07/25/2013   HGBA1C 8.6 04/25/2013   HGBA1C 9.0 01/03/2013     Assessment: Diabetes control: good control (HgbA1C at goal) Progress toward A1C goal:  at goal Comments: See above.  Plan: Medications:  continue current medications Home glucose monitoring: Frequency: 3 times a day Timing: before meals Instruction/counseling given: reminded to bring medications to each visit and discussed the need for weight loss Educational resources provided: brochure Self management tools provided: home glucose logbook Other plans: See above.

## 2013-07-25 NOTE — Patient Instructions (Signed)
General Instructions: 1. See me in 3-6 months 2. Your ear pain, runny nose, itchy eyes, and sneezing is likely allergies. Try the claritin - it should only cast $4. Take it until the first hard frost. 3. You are doing great with your diabetes, blood pressure, and cholesterol!    Treatment Goals:  Goals (1 Years of Data) as of 07/25/13         As of Today 04/25/13 01/10/13 01/03/13 01/03/13     Blood Pressure    . Blood Pressure < 140/90  144/72 140/70 156/88 143/68 159/71     Result Component    . HEMOGLOBIN A1C < 7.0  7.7 8.6  9.0     . LDL CALC < 100            Progress Toward Treatment Goals:  Treatment Goal 07/25/2013  Hemoglobin A1C at goal  Blood pressure at goal    Self Care Goals & Plans:  Self Care Goal 07/25/2013  Manage my medications take my medicines as prescribed; refill my medications on time  Monitor my health check my feet daily  Eat healthy foods eat baked foods instead of fried foods; eat foods that are low in salt  Be physically active take a walk every day    Home Blood Glucose Monitoring 07/25/2013  Check my blood sugar 3 times a day  When to check my blood sugar before meals     Care Management & Community Referrals:  Referral 07/25/2013  Referrals made for care management support none needed  Referrals made to community resources none

## 2013-07-25 NOTE — Assessment & Plan Note (Addendum)
She states she has pain all over. Does not use anything other than topical voltarin for pain. [name] [name] is on her med list but I did not get the sense that she was entirely aware of her meds. Stated she would bring meds with her next visit. Uses cane when goes out. Still able to go to church and stores.

## 2013-07-25 NOTE — Assessment & Plan Note (Signed)
We discussed how weight is tied to DM, HTN, HLD, OA.Marland KitchenMarland Kitchen

## 2013-07-25 NOTE — Assessment & Plan Note (Signed)
She endorses taking her paxil.

## 2013-07-25 NOTE — Progress Notes (Signed)
  Subjective:    Patient ID: [name] [name], female    DOB: Jul 18, 1946, 67 y.o.   MRN: [id number]  Diabetes  Arm Pain     Please see the A&P for the status of the pt's chronic medical problems.  Review of Systems  Constitutional: Negative for activity change, appetite change and unexpected weight change.  Endocrine:       No hypoglycemic sxs  Musculoskeletal: Positive for arthralgias, back pain and gait problem.       No falls  Psychiatric/Behavioral: Negative for dysphoric mood.       Objective:   Physical Exam  Constitutional: She is oriented to person, place, and time. She appears well-developed and well-nourished. No distress.  obese  HENT:  Head: Normocephalic and atraumatic.  Right Ear: External ear normal.  Left Ear: External ear normal.  Nose: Nose normal.  Eyes: Conjunctivae are normal.  Neck: Normal range of motion. Neck supple.  Cardiovascular: Normal rate and regular rhythm.   Murmur heard. Pulmonary/Chest: Effort normal and breath sounds normal.  Musculoskeletal: Normal range of motion. She exhibits edema. She exhibits no tenderness.  Neurological: She is oriented to person, place, and time.  Skin: Skin is warm and dry. She is not diaphoretic.  Psychiatric: She has a normal mood and affect. Her behavior is normal. Judgment and thought content normal.          Assessment & Plan:

## 2013-07-25 NOTE — Assessment & Plan Note (Signed)
Uses her ambien just PRN.    

## 2013-07-25 NOTE — Assessment & Plan Note (Signed)
Flu vaccine today.  She c/o B ear pain for a few weeks. Also endorsed other allergic sxs like rhinorrhea, itchy eyes, watery eyes, congestion. On exam, her ears had dull TM's B but no erythema. There was wax in the canals. Likely all allergry related so trial of Claritin (she wanted to avoid nasal spray). If no better, to call and then will try nasal steroid.

## 2013-08-30 ENCOUNTER — Other Ambulatory Visit: Payer: Self-pay

## 2013-08-30 MED ORDER — NITROGLYCERIN 0.4 MG SL SUBL: 0.4000 mg | SUBLINGUAL_TABLET | SUBLINGUAL | Status: DC | PRN

## 2013-11-20 ENCOUNTER — Ambulatory Visit: Payer: PRIVATE HEALTH INSURANCE | Admitting: Sports Medicine

## 2013-11-28 ENCOUNTER — Ambulatory Visit: Payer: PRIVATE HEALTH INSURANCE | Admitting: Internal Medicine

## 2013-12-04 ENCOUNTER — Ambulatory Visit: Payer: PRIVATE HEALTH INSURANCE | Admitting: Sports Medicine

## 2013-12-11 ENCOUNTER — Encounter: Payer: Self-pay | Admitting: Sports Medicine

## 2013-12-11 ENCOUNTER — Ambulatory Visit (INDEPENDENT_AMBULATORY_CARE_PROVIDER_SITE_OTHER): Payer: PRIVATE HEALTH INSURANCE | Admitting: Sports Medicine

## 2013-12-11 VITALS — BP 166/73 | Ht 60.0 in | Wt 232.0 lb

## 2013-12-11 DIAGNOSIS — M79609 Pain in unspecified limb: Secondary | ICD-10-CM

## 2013-12-11 DIAGNOSIS — M79641 Pain in right hand: Secondary | ICD-10-CM

## 2013-12-11 DIAGNOSIS — M79642 Pain in left hand: Principal | ICD-10-CM

## 2013-12-11 DIAGNOSIS — M25519 Pain in unspecified shoulder: Secondary | ICD-10-CM

## 2013-12-11 MED ORDER — METHYLPREDNISOLONE ACETATE 40 MG/ML IJ SUSP
40.0000 mg | Freq: Once | INTRAMUSCULAR | Status: AC
  Administered 2013-12-11: 40 mg via INTRA_ARTICULAR

## 2013-12-11 NOTE — Progress Notes (Signed)
   Subjective:    Patient ID: [name] [name], female    DOB: [birth year], 68 y.o.   MRN: [id number]  HPI  68 yo woman with history of rotator cuff surgery on the left complaining of Left neck, arm, and shoulder pain for approximately 1 month. She reports falling on her left side in December with no immediate pain following the fall however now her shoulder has been bothering her. She has sharp radiating pain with tingling which starts at the neck and travels to the fingers which lasts about and hour when it occurs. No numbness. Pain is worse with shoulder and neck movement. Improves with rest. She has not tried any medicine to treat the pain.   She is also complaining of bilateral wrist pain. This has been going on for 2 years. She has X-rays from 2013 which showed Osteoarthritis. She previously had her hands injected twice with improvement in pain symptoms. Denies tingling or loss of sensation. She says the pain is worse on the left thumb and on the right middle finger dorsum location. Pain is crampy in nature and worse with repeatative motion like using a can opener or cutting. She takes Advil 400 MG every 2 hours as needed for pain which helps with the symptoms, she typically needs medication 2-3 times a week. She says that the left wrist is worse than the right. She walks with a cane which she holds in her right hand.     Review of Systems Negative apart from HPI     Objective:   Physical Exam  Neck: Normal range of motion. Neck supple.  Tenderness to palpation along cervical spine which produces tingling pain to finger-tips. Most tender adjacent to C3-4 region.   Musculoskeletal:       Left shoulder: She exhibits decreased range of motion and decreased strength.       Left wrist: She exhibits tenderness. She exhibits normal range of motion.  Left should with scar. Right 3rd metacarpal with bossing.   Neurological: She displays abnormal reflex. No sensory deficit.  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   1. Left Rotator Cuff Inflammation: Most likely given history and pt with fall. No concerning neurological deficits and pain with shoulder ROM.  --Subacromial cortisone injection administered today --Follow-up in 3 weeks  2. Osteoarthritis of Hands: Continue to take Advil as needed for pain.  --Repeat X-rays of hands prior to follow-up in 3 weeks. --Will discuss possible injections at followup visit  Consent obtained and verified. Time-out conducted. Noted no overlying erythema, induration, or other signs of local infection. Skin prepped in a sterile fashion. Topical analgesic spray: Ethyl chloride. Joint: left subacromail space Needle: 25g 1.5 inch Completed without difficulty. Meds: 3cc 1% xylocaine, 1cc(40mg ) depomedrol  Advised to call if fevers/chills, erythema, induration, drainage, or persistent bleeding.   Seen and examined with Jacqulyn Liner, MS4

## 2013-12-14 ENCOUNTER — Ambulatory Visit
Admission: RE | Admit: 2013-12-14 | Discharge: 2013-12-14 | Disposition: A | Discharge status: Home / Self Care | Payer: PRIVATE HEALTH INSURANCE | Source: Ambulatory Visit | Attending: Sports Medicine | Admitting: Sports Medicine

## 2013-12-14 DIAGNOSIS — M79641 Pain in right hand: Secondary | ICD-10-CM

## 2013-12-14 DIAGNOSIS — M79642 Pain in left hand: Principal | ICD-10-CM

## 2013-12-19 ENCOUNTER — Encounter: Payer: Self-pay | Admitting: Internal Medicine

## 2013-12-19 ENCOUNTER — Ambulatory Visit (INDEPENDENT_AMBULATORY_CARE_PROVIDER_SITE_OTHER): Payer: PRIVATE HEALTH INSURANCE | Admitting: Internal Medicine

## 2013-12-19 VITALS — BP 139/71 | HR 74 | Temp 97.8°F | Ht 62.0 in | Wt 230.5 lb

## 2013-12-19 DIAGNOSIS — E559 Vitamin D deficiency, unspecified: Secondary | ICD-10-CM

## 2013-12-19 DIAGNOSIS — I2510 Atherosclerotic heart disease of native coronary artery without angina pectoris: Secondary | ICD-10-CM

## 2013-12-19 DIAGNOSIS — G4700 Insomnia, unspecified: Secondary | ICD-10-CM

## 2013-12-19 DIAGNOSIS — E785 Hyperlipidemia, unspecified: Secondary | ICD-10-CM

## 2013-12-19 DIAGNOSIS — E669 Obesity, unspecified: Secondary | ICD-10-CM

## 2013-12-19 DIAGNOSIS — E538 Deficiency of other specified B group vitamins: Secondary | ICD-10-CM

## 2013-12-19 DIAGNOSIS — F329 Major depressive disorder, single episode, unspecified: Secondary | ICD-10-CM

## 2013-12-19 DIAGNOSIS — E119 Type 2 diabetes mellitus without complications: Secondary | ICD-10-CM

## 2013-12-19 DIAGNOSIS — F3289 Other specified depressive episodes: Secondary | ICD-10-CM

## 2013-12-19 DIAGNOSIS — G894 Chronic pain syndrome: Secondary | ICD-10-CM

## 2013-12-19 DIAGNOSIS — Z0000 Encounter for general adult medical examination without abnormal findings: Secondary | ICD-10-CM

## 2013-12-19 DIAGNOSIS — I10 Essential (primary) hypertension: Secondary | ICD-10-CM

## 2013-12-19 DIAGNOSIS — K219 Gastro-esophageal reflux disease without esophagitis: Secondary | ICD-10-CM

## 2013-12-19 LAB — LIPID PANEL
Cholesterol: 124 mg/dL (ref 0–200)
HDL: 55 mg/dL (ref >39)
LDL Cholesterol: 59 mg/dL (ref 0–99)
Total CHOL/HDL Ratio: 2.3 Ratio
Triglycerides: 48 mg/dL (ref <150)
VLDL: 10 mg/dL (ref 0–40)

## 2013-12-19 LAB — GLUCOSE, CAPILLARY: Glucose-Capillary: 101 mg/dL — ABNORMAL HIGH (ref 70–99)

## 2013-12-19 LAB — POCT GLYCOSYLATED HEMOGLOBIN (HGB A1C): Hemoglobin A1C: 8.8

## 2013-12-19 LAB — MAGNESIUM: Magnesium: 1.3 mg/dL — ABNORMAL LOW (ref 1.5–2.5)

## 2013-12-19 MED ORDER — GLUCOSE BLOOD VI STRP: 1.0000 | ORAL_STRIP | Freq: Three times a day (TID) | Status: DC

## 2013-12-19 MED ORDER — INSULIN PEN NEEDLE 31G X 5 MM MISC: Status: DC

## 2013-12-19 MED ORDER — LANCETS MISC: Status: DC

## 2013-12-19 NOTE — Assessment & Plan Note (Signed)
Recheck was great today. Leave meds as is.  BP Readings from Last 3 Encounters:  12/19/13 139/71  12/11/13 166/73  07/25/13 144/72    Lab Results  Component Value Date   NA 140 04/25/2013   K 4.6 04/25/2013   CREATININE 0.68 04/25/2013    Assessment: Blood pressure control: mildly elevated Progress toward BP goal:  unchanged Comments: See above  Plan: Medications:  continue current medications Educational resources provided:   Self management tools provided:   Other plans: See above

## 2013-12-19 NOTE — Assessment & Plan Note (Addendum)
We discussed her colon. UTD on all others.   Allergy sxs OK on claritin. Thinks will only need through Spring.

## 2013-12-19 NOTE — Assessment & Plan Note (Signed)
She endorses taking her paxil and has well controlled sxs.

## 2013-12-19 NOTE — Assessment & Plan Note (Signed)
On Po so check level.

## 2013-12-19 NOTE — Assessment & Plan Note (Signed)
[facility] to [facility].

## 2013-12-19 NOTE — Assessment & Plan Note (Signed)
On weekly high dose so check level to ensure not too high.

## 2013-12-19 NOTE — Assessment & Plan Note (Addendum)
Her A1C has increased. She checks her CBG TID and brought in her log. A couple of AM CBG's were in 110's. She states symptomatic when < 135 range. She thinks overall they have increased. On metformin 1000 BID and lantus 20. She is having trouble being active 2/2 transportation ([facility] to [facility] to [facility] but [facility]), DOE, and pain. We discussed three options -  1. Leave meds as is, get SCAT, and go to [facility]  2. Increased lantus 3. Add prandial insulin  She thought option 1 was best. I filled out SCAT form and asked to to do water aerobics or seated exercises.  F/U 3 months.   Lab Results  Component Value Date   HGBA1C 8.8 12/19/2013   HGBA1C 7.7 07/25/2013   HGBA1C 8.6 04/25/2013     Assessment: Diabetes control: fair control Progress toward A1C goal:  deteriorated Comments: See above  Plan: Medications:  continue current medications Home glucose monitoring: Frequency: 3 times a day Timing: before meals Instruction/counseling given: discussed the need for weight loss and discussed diet Educational resources provided:   Self management tools provided:   Other plans: See above

## 2013-12-19 NOTE — Patient Instructions (Signed)
General Instructions: 1. See me in 3 months 2. Turn in the SCAT forms 3. Use [facility] to go to [facility] to move round as much as you can - water aerobics or sitting down exercises 4. Pay close attention to your diet as your sugar tests are high   Treatment Goals:  Goals (1 Years of Data) as of 12/19/13         As of Today 12/11/13 07/25/13 04/25/13 01/10/13     Blood Pressure    . Blood Pressure < 140/90  168/74 166/73 144/72 140/70 156/88     Result Component    . HEMOGLOBIN A1C < 7.0  8.8  7.7 8.6     . LDL CALC < 100            Progress Toward Treatment Goals:  Treatment Goal 12/19/2013  Hemoglobin A1C deteriorated  Blood pressure unchanged    Self Care Goals & Plans:  Self Care Goal 07/25/2013  Manage my medications take my medicines as prescribed; refill my medications on time  Monitor my health check my feet daily  Eat healthy foods eat baked foods instead of fried foods; eat foods that are low in salt  Be physically active take a walk every day    Home Blood Glucose Monitoring 12/19/2013  Check my blood sugar 3 times a day  When to check my blood sugar before meals     Care Management & Community Referrals:  Referral 12/19/2013  Referrals made for care management support -  Referrals made to community resources transportation

## 2013-12-19 NOTE — Assessment & Plan Note (Addendum)
OA hands B L rotator cuff inflamm 2/2 steroid injection L hip pain - can't stand more than 5 min L2-5 fusion, with hardware at L2-3, stable per MRI 2010.  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   Uses Mobic and voltarin gel.    Plan :  She was seen by [facility] and will return [facility] form completed To [facility] for exercises

## 2013-12-19 NOTE — Assessment & Plan Note (Signed)
Doing well on PO Vit B12.

## 2013-12-19 NOTE — Assessment & Plan Note (Signed)
Cont atorva and check FLP today.

## 2013-12-19 NOTE — Progress Notes (Signed)
   Subjective:    Patient ID: [name] [name], female    DOB: [birth year], 68 y.o.   MRN: [id number]  HPI  Please see the A&P for the status of the pt's chronic medical problems.  Review of Systems  Constitutional: Negative for unexpected weight change.  HENT: Negative for rhinorrhea and sneezing.   Respiratory: Positive for shortness of breath.   Cardiovascular: Positive for leg swelling. Negative for chest pain.  Gastrointestinal:       No GERD sxs  Endocrine:       + hypoglycemic sxs (weak) when ABG about 135  Musculoskeletal: Positive for arthralgias and gait problem.  Psychiatric/Behavioral: Positive for sleep disturbance. Negative for dysphoric mood.       Objective:   Physical Exam  Constitutional: She is oriented to person, place, and time. She appears well-developed and well-nourished. No distress.  HENT:  Head: Normocephalic and atraumatic.  Right Ear: External ear normal.  Left Ear: External ear normal.  Nose: Nose normal.  Eyes: Conjunctivae and EOM are normal.  Cardiovascular: Normal rate, regular rhythm and normal heart sounds.   Pulmonary/Chest: Effort normal and breath sounds normal.  Musculoskeletal: Normal range of motion. She exhibits edema. She exhibits no tenderness.  Neurological: She is alert and oriented to person, place, and time.  Skin: Skin is warm and dry. She is not diaphoretic.  Psychiatric: She has a normal mood and affect. Her behavior is normal. Judgment and thought content normal.          Assessment & Plan:

## 2013-12-19 NOTE — Assessment & Plan Note (Signed)
Well controlled on her H2B. Cont.

## 2013-12-19 NOTE — Assessment & Plan Note (Signed)
She is on ASA, BB, and statin. Denies CP today. Does get DOE.

## 2013-12-19 NOTE — Assessment & Plan Note (Signed)
Uses her ambien just PRN.

## 2013-12-20 ENCOUNTER — Encounter: Payer: Self-pay | Admitting: Internal Medicine

## 2013-12-20 LAB — VITAMIN D 25 HYDROXY (VIT D DEFICIENCY, FRACTURES): Vit D, 25-Hydroxy: 36 ng/mL (ref 30–89)

## 2013-12-21 ENCOUNTER — Emergency Department (HOSPITAL_COMMUNITY): Payer: PRIVATE HEALTH INSURANCE

## 2013-12-21 ENCOUNTER — Observation Stay (HOSPITAL_COMMUNITY)
Admission: EM | Admit: 2013-12-21 | Discharge: 2013-12-22 | Disposition: A | Discharge status: Home / Self Care | Payer: PRIVATE HEALTH INSURANCE | Attending: Internal Medicine | Admitting: Internal Medicine

## 2013-12-21 ENCOUNTER — Other Ambulatory Visit: Payer: Self-pay

## 2013-12-21 ENCOUNTER — Encounter (HOSPITAL_COMMUNITY): Payer: Self-pay | Admitting: Emergency Medicine

## 2013-12-21 DIAGNOSIS — IMO0002 Reserved for concepts with insufficient information to code with codable children: Secondary | ICD-10-CM | POA: Diagnosis present

## 2013-12-21 DIAGNOSIS — E1151 Type 2 diabetes mellitus with diabetic peripheral angiopathy without gangrene: Secondary | ICD-10-CM | POA: Diagnosis present

## 2013-12-21 DIAGNOSIS — E119 Type 2 diabetes mellitus without complications: Secondary | ICD-10-CM | POA: Insufficient documentation

## 2013-12-21 DIAGNOSIS — I5032 Chronic diastolic (congestive) heart failure: Secondary | ICD-10-CM | POA: Diagnosis not present

## 2013-12-21 DIAGNOSIS — E785 Hyperlipidemia, unspecified: Secondary | ICD-10-CM | POA: Diagnosis not present

## 2013-12-21 DIAGNOSIS — K219 Gastro-esophageal reflux disease without esophagitis: Secondary | ICD-10-CM | POA: Diagnosis not present

## 2013-12-21 DIAGNOSIS — Z79899 Other long term (current) drug therapy: Secondary | ICD-10-CM | POA: Diagnosis not present

## 2013-12-21 DIAGNOSIS — R072 Precordial pain: Secondary | ICD-10-CM | POA: Diagnosis not present

## 2013-12-21 DIAGNOSIS — E1165 Type 2 diabetes mellitus with hyperglycemia: Secondary | ICD-10-CM | POA: Diagnosis present

## 2013-12-21 DIAGNOSIS — Z7982 Long term (current) use of aspirin: Secondary | ICD-10-CM | POA: Insufficient documentation

## 2013-12-21 DIAGNOSIS — I110 Hypertensive heart disease with heart failure: Secondary | ICD-10-CM | POA: Diagnosis present

## 2013-12-21 DIAGNOSIS — I2510 Atherosclerotic heart disease of native coronary artery without angina pectoris: Secondary | ICD-10-CM | POA: Diagnosis not present

## 2013-12-21 DIAGNOSIS — R079 Chest pain, unspecified: Secondary | ICD-10-CM | POA: Diagnosis present

## 2013-12-21 DIAGNOSIS — I10 Essential (primary) hypertension: Secondary | ICD-10-CM | POA: Diagnosis not present

## 2013-12-21 DIAGNOSIS — E669 Obesity, unspecified: Secondary | ICD-10-CM | POA: Diagnosis not present

## 2013-12-21 DIAGNOSIS — F329 Major depressive disorder, single episode, unspecified: Secondary | ICD-10-CM | POA: Insufficient documentation

## 2013-12-21 DIAGNOSIS — E1159 Type 2 diabetes mellitus with other circulatory complications: Secondary | ICD-10-CM | POA: Diagnosis present

## 2013-12-21 DIAGNOSIS — I152 Hypertension secondary to endocrine disorders: Secondary | ICD-10-CM | POA: Diagnosis present

## 2013-12-21 DIAGNOSIS — Z794 Long term (current) use of insulin: Secondary | ICD-10-CM | POA: Insufficient documentation

## 2013-12-21 DIAGNOSIS — F3289 Other specified depressive episodes: Secondary | ICD-10-CM | POA: Diagnosis not present

## 2013-12-21 DIAGNOSIS — G894 Chronic pain syndrome: Secondary | ICD-10-CM | POA: Diagnosis present

## 2013-12-21 LAB — COMPREHENSIVE METABOLIC PANEL
ALT: 14 U/L (ref 0–35)
AST: 15 U/L (ref 0–37)
Albumin: 3.8 g/dL (ref 3.5–5.2)
Alkaline Phosphatase: 87 U/L (ref 39–117)
BUN: 12 mg/dL (ref 6–23)
CO2: 30 mEq/L (ref 19–32)
Calcium: 9.4 mg/dL (ref 8.4–10.5)
Chloride: 97 mEq/L (ref 96–112)
Creatinine, Ser: 0.57 mg/dL (ref 0.50–1.10)
GFR calc Af Amer: >90 mL/min (ref >90)
GFR calc non Af Amer: >90 mL/min (ref >90)
Glucose, Bld: 122 mg/dL — ABNORMAL HIGH (ref 70–99)
Potassium: 3.8 mEq/L (ref 3.7–5.3)
Sodium: 140 mEq/L (ref 137–147)
Total Bilirubin: 0.7 mg/dL (ref 0.3–1.2)
Total Protein: 7.7 g/dL (ref 6.0–8.3)

## 2013-12-21 LAB — CBC
HCT: 38.1 % (ref 36.0–46.0)
Hemoglobin: 13.3 g/dL (ref 12.0–15.0)
MCH: 29.2 pg (ref 26.0–34.0)
MCHC: 34.9 g/dL (ref 30.0–36.0)
MCV: 83.7 fL (ref 78.0–100.0)
Platelets: 303 10*3/uL (ref 150–400)
RBC: 4.55 MIL/uL (ref 3.87–5.11)
RDW: 14.7 % (ref 11.5–15.5)
WBC: 6.9 10*3/uL (ref 4.0–10.5)

## 2013-12-21 LAB — I-STAT TROPONIN, ED: Troponin i, poc: 0 ng/mL (ref 0.00–0.08)

## 2013-12-21 MED ORDER — ASPIRIN 81 MG PO CHEW
324.0000 mg | CHEWABLE_TABLET | Freq: Once | ORAL | Status: AC
  Administered 2013-12-21: 324 mg via ORAL
  Filled 2013-12-21: qty 4

## 2013-12-21 MED ORDER — FUROSEMIDE 40 MG PO TABS
40.0000 mg | ORAL_TABLET | Freq: Once | ORAL | Status: AC
  Administered 2013-12-22: 40 mg via ORAL
  Filled 2013-12-21: qty 1

## 2013-12-21 NOTE — ED Notes (Signed)
PT c/o waking up with L sided chest pain that radiated to L arm and high blood pressure.  Pt called her dr but call was not returned.  Presently bp is 160/72.

## 2013-12-21 NOTE — ED Provider Notes (Signed)
CSN: [id number]     Arrival date & time 12/21/13  57 History   First MD Initiated Contact with Patient 12/21/13 2134     Chief Complaint  Patient presents with  . Chest Pain  . Hypertension     (Consider location/radiation/quality/duration/timing/severity/associated sxs/prior Treatment) HPI Comments: 68 yo AA female with MMP to include - CAD, DM, HTN, hyperlipidemia, obesity, GERD presents with CP.  Onset this AM upon awakening worse with ambulation (light exertion).  Pt took NTG x 2 today - 0900 and 1400.  Improved symptoms.  Cardiology: Dr. Lia Foyer PCP: Dr. Lynnae January  Pt is pain free at 2200.    Patient is a 68 y.o. female presenting with chest pain and hypertension. The history is provided by the patient and a relative.  Chest Pain Pain location:  Substernal area and L chest Pain quality: aching   Pain quality: not burning, not crushing, not dull, not hot and no pressure   Pain radiates to:  Does not radiate Pain radiates to the back: no   Pain severity:  Moderate Onset quality:  Gradual Duration:  12 hours Timing:  Intermittent Progression:  Waxing and waning Chronicity:  Recurrent Context: stress   Context: not breathing, no drug use, not eating, no intercourse, not lifting, not at rest and no trauma   Relieved by:  Nothing Worsened by:  Nothing tried Ineffective treatments:  None tried Associated symptoms: no cough, no nausea, no palpitations and no shortness of breath   Hypertension Associated symptoms include chest pain. Pertinent negatives include no shortness of breath.    Past Medical History  Diagnosis Date  . CAD 08/26/2006    Cath 07/05:multiple areas of nonobstructive disease = medical & RF mgmt, well preserved global systolic function. Dr. Lia Foyer      . DIABETES MELLITUS, TYPE II 08/26/2006    Insulin dependent    . HYPERTENSION 08/26/2006    On BB, ACEI, lasix, and imdur.     . Chronic pain syndrome 01/03/2013    Multifactorial. Non opioid  requiring.   L2-5 fusion, with hardware at L2-3, stable per MRI 2010. Followed by neurosurgery (Dr. 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    [name] DEPRESSION 08/26/2006    On paxil    . DYSLIPIDEMIA 11/01/2006    LDL goal < 100, 70 if can achieve without side effects.     . OBESITY 08/26/2006    BMI 39.5. Obesity Class 2.     . GERD 08/26/2006    Heartburn QHS.      Past Surgical History  Procedure Laterality Date  . Posterior lumbar interfusion surgery  07/18/07    L2-L3  . Lumbar fusion surgery  10/08    L3-L5  . Rotator cuff surgery    . Cholecystectomy    . Endometrial biospy     Family History  Problem Relation Age of Onset  . Hypertension Sister   . Hodgkin's lymphoma Sister    History  Substance Use Topics  . Smoking status: Never Smoker   . Smokeless tobacco: Never Used  . Alcohol Use: No   OB History   Grav Para Term Preterm Abortions TAB SAB Ect Mult Living                 Review of Systems  HENT: Negative.   Eyes: Negative.   Respiratory: Negative.  Negative for cough, choking, chest tightness, shortness of breath and stridor.   Cardiovascular:  Positive for chest pain. Negative for palpitations and leg swelling.  Gastrointestinal: Negative.  Negative for nausea, diarrhea, constipation, anal bleeding and rectal pain.  Endocrine: Negative.   Genitourinary: Negative.   Musculoskeletal: Negative.   Allergic/Immunologic: Negative.   Neurological: Negative.   Hematological: Negative.   All other systems reviewed and are negative.      Allergies  Review of patient's allergies indicates no known allergies.  Home Medications   Current Outpatient Rx  Name  Route  Sig  Dispense  Refill  . amLODipine (NORVASC) 5 MG tablet   Oral   Take 1 tablet (5 mg total) by mouth daily.   90 tablet   3   . aspirin 81 MG tablet   Oral   Take 1 tablet (81 mg total) by mouth daily.   90 tablet   3    . atenolol (TENORMIN) 100 MG tablet   Oral   Take 0.5 tablets (50 mg total) by mouth daily.   45 tablet   3   . atorvastatin (LIPITOR) 20 MG tablet   Oral   Take 1 tablet (20 mg total) by mouth daily.   90 tablet   3   . benazepril (LOTENSIN) 40 MG tablet      take 1 tablet by mouth once daily   90 tablet   3   . calcium carbonate (OS-CAL) 600 MG TABS   Oral   Take 1 tablet (600 mg total) by mouth 2 (two) times daily with a meal.   180 tablet   3   . cetaphil (CETAPHIL) cream   Topical   Apply 1 application topically 2 (two) times daily as needed (pain).         . cholecalciferol (VITAMIN D) 1000 UNITS tablet   Oral   Take 1 tablet (1,000 Units total) by mouth daily.   30 tablet   5   . Cyanocobalamin 1000 MCG CAPS   Oral   Take 1,000 mcg by mouth daily.   90 capsule   3   . diclofenac sodium (VOLTAREN) 1 % GEL   Topical   Apply 4 g topically 4 (four) times daily. Apply small amount to affected area up to 4 times a day.   100 g   5   . ferrous sulfate 325 (65 FE) MG tablet   Oral   Take 1 tablet (325 mg total) by mouth 2 (two) times daily with a meal.   180 tablet   3   . furosemide (LASIX) 40 MG tablet   Oral   Take 1 tablet (40 mg total) by mouth daily.   90 tablet   3   . gabapentin (NEURONTIN) 400 MG capsule      take 1 capsule by mouth three times a day   270 capsule   3   . Insulin Glargine (LANTUS SOLOSTAR) 100 UNIT/ML SOPN   Subcutaneous   Inject 20 Units into the skin at bedtime.   90 mL   3   . isosorbide mononitrate (IMDUR) 30 MG 24 hr tablet   Oral   Take 1 tablet (30 mg total) by mouth daily.   90 tablet   3   . loratadine (CLARITIN) 10 MG tablet   Oral   Take 10 mg by mouth daily as needed for allergies.         . Magnesium Chloride (MAG-DELAY) 535 (64 MG) MG TBCR   Oral   Take 1 tablet (64 mg total) by  mouth 2 (two) times daily.   180 tablet   3   . meloxicam (MOBIC) 7.5 MG tablet   Oral   Take 1 tablet (7.5  mg total) by mouth daily as needed for pain.   90 tablet   3   . metFORMIN (GLUCOPHAGE) 1000 MG tablet   Oral   Take 1 tablet (1,000 mg total) by mouth 2 (two) times daily with a meal.   180 tablet   3   . nitroGLYCERIN (NITROSTAT) 0.4 MG SL tablet   Sublingual   Place 1 tablet (0.4 mg total) under the tongue every 5 (five) minutes as needed for chest pain.   25 tablet   3     .Marland KitchenPatient needs to contact office to schedule  App ...   . PARoxetine (PAXIL) 40 MG tablet   Oral   Take 1 tablet (40 mg total) by mouth every morning.   90 tablet   3   . ranitidine (ZANTAC) 150 MG tablet   Oral   Take 1 tablet (150 mg total) by mouth 2 (two) times daily. Try once a day in the evening. If not effective, increase to one pill twice a day   180 tablet   3   . zolpidem (AMBIEN CR) 6.25 MG CR tablet   Oral   Take 1 tablet (6.25 mg total) by mouth at bedtime as needed for sleep.   90 tablet   1    BP 147/61  Pulse 67  Temp(Src) 98.6 F (37 C) (Oral)  Resp 21  Ht 5\' 4"  (1.626 m)  Wt 230 lb (104.327 kg)  BMI 39.46 kg/m2  SpO2 98% Physical Exam  Nursing note and vitals reviewed. Constitutional: She is oriented to person, place, and time. She appears well-developed and well-nourished.  HENT:  Head: Normocephalic and atraumatic.  Mouth/Throat: Oropharynx is clear and moist. No oropharyngeal exudate.  Eyes: Conjunctivae are normal. Right eye exhibits no discharge. Left eye exhibits no discharge.  Neck: Normal range of motion. Neck supple. No JVD present.  Cardiovascular: Normal rate and regular rhythm.   Pulmonary/Chest: Effort normal and breath sounds normal. No stridor.  Abdominal: Soft. Bowel sounds are normal. There is no tenderness. There is no rebound and no guarding.  Musculoskeletal: Normal range of motion. She exhibits no edema and no tenderness.  Neurological: She is alert and oriented to person, place, and time. She has normal reflexes.  Skin: Skin is warm and dry.     ED Course  Procedures (including critical care time) Labs Review Labs Reviewed  COMPREHENSIVE METABOLIC PANEL - Abnormal; Notable for the following:    Glucose, Bld 122 (*)    All other components within normal limits  CBC  I-STAT TROPOININ, ED   Imaging Review Dg Chest 2 View  12/21/2013   CLINICAL DATA:  Chest pain  EXAM: CHEST  2 VIEW  COMPARISON:  03/18/2007  FINDINGS: The heart size and mediastinal contours are within normal limits. Both lungs are clear. The visualized skeletal structures show postsurgical changes in the lumbar spine and both shoulders.  IMPRESSION: No active cardiopulmonary disease.   Electronically Signed   By: Inez Catalina M.D.   On: 12/21/2013 18:26     EKG Interpretation None       Date: 12/21/2013  Rate: nsr  Rhythm: normal sinus rhythm  QRS Axis: normal  Intervals: normal  ST/T Wave abnormalities: normal  Conduction Disutrbances:none  Narrative Interpretation:   Old EKG Reviewed: changes noted  MDM   Final diagnoses:  Chest pain  Coronary atherosclerosis of unspecified type of vessel, native or graft  Diastolic CHF, chronic  Hyperlipidemia    68 yo AA female with multiple cardiac risk factors presents to ER with CP that improved today with NTG at home.  Pt symptom free in ER.  Benign exam, VSS.  Initial w/u negative.  Consult to Int Med for admission for ACS r/o.  Pt and family agree with plan.  Full dose ASA given.      Elmer Sow, MD 12/22/13 [id number]

## 2013-12-21 NOTE — H&P (Signed)
Date: 12/21/2013               Patient Name:  [name] [name] MRN: [id number]  DOB: June 09, 1946 Age / Sex: 68 y.o., female   PCP: Bartholomew Crews, MD         Medical Service: Internal Medicine Teaching Service         Attending Physician: Dr. Axel Filler, MD    First Contact: Dr. Judeth Cornfield Pager: [phone]  Second Contact: Dr. Clinton Gallant Pager: [phone]       After Hours (After 5p/  First Contact Pager: [phone]  weekends / holidays): Second Contact Pager: [phone]   Chief Complaint: chest pain  History of Present Illness: [name] [name] is a 68 year old woman with a PMH of DM type 2 (HgbA1c 8.17 December 2013), HTN, non-obstructive CAD (per 81/8563 cath), diastolic HF (EF 14% ECHO 9702) and chronic pain syndrome who presents with L sided chest pain that started this AM while she was cleaning around her house.  The pain is 7/10, sharp and located beneath the left breast.  It radiates to the left jaw and leg and is accompanied by diaphoresis.  She denies N/V or abdominal pain.  Symptoms improved after 1 NTG, which she took as soon as the pain started around 9AM.  The pain would come and go intermittently (both with exertion and at rest) and last for about 5 minutes at a time and resolve spontaneously and with rest.  She reports about 1 hour pain-free intervals.  The patient took a second NTG at [facility] and called EMS.  The patient says she has not had chest pain in awhile and prior to today, had not taken NTG in about 1 year ago.  She is compliant with all medications.  Dr. Lia Foyer is her cardiologist.  In the ED:  T 98.57F, RR 18, SpO2 96%, HR 71, BP 160/72 --> 127/67; she received ASA 324mg .  Meds: No current facility-administered medications for this encounter.   Current Outpatient Prescriptions  Medication Sig Dispense Refill  . amLODipine (NORVASC) 5 MG tablet Take 1 tablet (5 mg total) by mouth daily.  90 tablet  3  . aspirin 81 MG tablet Take 1 tablet (81 mg total) by mouth daily.   90 tablet  3  . atenolol (TENORMIN) 100 MG tablet Take 0.5 tablets (50 mg total) by mouth daily.  45 tablet  3  . atorvastatin (LIPITOR) 20 MG tablet Take 1 tablet (20 mg total) by mouth daily.  90 tablet  3  . benazepril (LOTENSIN) 40 MG tablet take 1 tablet by mouth once daily  90 tablet  3  . calcium carbonate (OS-CAL) 600 MG TABS Take 1 tablet (600 mg total) by mouth 2 (two) times daily with a meal.  180 tablet  3  . cetaphil (CETAPHIL) cream Apply 1 application topically 2 (two) times daily as needed (pain).      . cholecalciferol (VITAMIN D) 1000 UNITS tablet Take 1 tablet (1,000 Units total) by mouth daily.  30 tablet  5  . Cyanocobalamin 1000 MCG CAPS Take 1,000 mcg by mouth daily.  90 capsule  3  . diclofenac sodium (VOLTAREN) 1 % GEL Apply 4 g topically 4 (four) times daily. Apply small amount to affected area up to 4 times a day.  100 g  5  . ferrous sulfate 325 (65 FE) MG tablet Take 1 tablet (325 mg total) by mouth 2 (two) times daily with a meal.  180 tablet  3  . furosemide (LASIX) 40 MG tablet Take 1 tablet (40 mg total) by mouth daily.  90 tablet  3  . gabapentin (NEURONTIN) 400 MG capsule take 1 capsule by mouth three times a day  270 capsule  3  . Insulin Glargine (LANTUS SOLOSTAR) 100 UNIT/ML SOPN Inject 20 Units into the skin at bedtime.  90 mL  3  . isosorbide mononitrate (IMDUR) 30 MG 24 hr tablet Take 1 tablet (30 mg total) by mouth daily.  90 tablet  3  . loratadine (CLARITIN) 10 MG tablet Take 10 mg by mouth daily as needed for allergies.      . Magnesium Chloride (MAG-DELAY) 535 (64 MG) MG TBCR Take 1 tablet (64 mg total) by mouth 2 (two) times daily.  180 tablet  3  . meloxicam (MOBIC) 7.5 MG tablet Take 1 tablet (7.5 mg total) by mouth daily as needed for pain.  90 tablet  3  . metFORMIN (GLUCOPHAGE) 1000 MG tablet Take 1 tablet (1,000 mg total) by mouth 2 (two) times daily with a meal.  180 tablet  3  . nitroGLYCERIN (NITROSTAT) 0.4 MG SL tablet Place 1 tablet (0.4 mg  total) under the tongue every 5 (five) minutes as needed for chest pain.  25 tablet  3  . PARoxetine (PAXIL) 40 MG tablet Take 1 tablet (40 mg total) by mouth every morning.  90 tablet  3  . ranitidine (ZANTAC) 150 MG tablet Take 1 tablet (150 mg total) by mouth 2 (two) times daily. Try once a day in the evening. If not effective, increase to one pill twice a day  180 tablet  3  . zolpidem (AMBIEN CR) 6.25 MG CR tablet Take 1 tablet (6.25 mg total) by mouth at bedtime as needed for sleep.  90 tablet  1    Allergies: Allergies as of 12/21/2013  . (No Known Allergies)   Past Medical History  Diagnosis Date  . CAD 08/26/2006    Cath 07/05:multiple areas of nonobstructive disease = medical & RF mgmt, well preserved global systolic function. Dr. Lia Foyer      . DIABETES MELLITUS, TYPE II 08/26/2006    Insulin dependent    . HYPERTENSION 08/26/2006    On BB, ACEI, lasix, and imdur.     . Chronic pain syndrome 01/03/2013    Multifactorial. Non opioid requiring.   L2-5 fusion, with hardware at L2-3, stable per MRI 2010. Followed by neurosurgery (Dr. 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    [name] DEPRESSION 08/26/2006    On paxil    . DYSLIPIDEMIA 11/01/2006    LDL goal < 100, 70 if can achieve without side effects.     . OBESITY 08/26/2006    BMI 39.5. Obesity Class 2.     . GERD 08/26/2006    Heartburn QHS.      Past Surgical History  Procedure Laterality Date  . Posterior lumbar interfusion surgery  07/18/07    L2-L3  . Lumbar fusion surgery  10/08    L3-L5  . Rotator cuff surgery    . Cholecystectomy    . Endometrial biospy     Family History  Problem Relation Age of Onset  . Hypertension Sister   . Hodgkin's lymphoma Sister    History   Social History  . Marital Status: Single    Spouse Name: N/A    Number of Children: N/A  . Years  of Education: N/A   Occupational History  . Not on file.   Social History  Main Topics  . Smoking status: Never Smoker   . Smokeless tobacco: Never Used  . Alcohol Use: No  . Drug Use: No  . Sexual Activity: Not on file   Other Topics Concern  . 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     Review of Systems: Pertinent items are noted in HPI. General:  increasing fatigue Cardiopulmonary:  + DOE, lower extremity edema; denies orthopnea or PND GI:  No N/V or change in bowel habits (she reports frequent BMs for several years) GU:  Denies dysuria Neuro:  Increased generalized weakness  Physical Exam: Blood pressure 169/70, pulse 67, temperature 98.6 F (37 C), temperature source Oral, resp. rate 21, height 5\' 4"  (1.626 m), weight 104.327 kg (230 lb), SpO2 99.00%. General: resting in bed in NAD HEENT: PERRL, EOMI, oropharynx clear Cardiac: RRR, no rubs or gallops, + systolic murmur, +JVD Pulm: decreased BS at bases, normal effort, no respiratory distress Abd: soft, nontender, BS present Ext: warm and well perfused, 1+ lower extremity edema Neuro: alert and oriented X3, cranial nerves II-XII grossly intact, 5/5 MMS  Lab results: Basic Metabolic Panel:  Recent Labs  12/19/13 1106 12/21/13 1801  NA  --  140  K  --  3.8  CL  --  97  CO2  --  30  GLUCOSE  --  122*  BUN  --  12  CREATININE  --  0.57  CALCIUM  --  9.4  MG 1.3*  --    Liver Function Tests:  Recent Labs  12/21/13 1801  AST 15  ALT 14  ALKPHOS 87  BILITOT 0.7  PROT 7.7  ALBUMIN 3.8   CBC:  Recent Labs  12/21/13 1801  WBC 6.9  HGB 13.3  HCT 38.1  MCV 83.7  PLT 303   Cardiac Enzymes:  Recent Labs  12/22/13 0442  TROPONINI <0.30   BNP:  Recent Labs  12/22/13 0442  PROBNP 47.2   CBG:  Recent Labs  12/19/13 0959  GLUCAP 101*   Hemoglobin A1C:  Recent Labs  12/19/13 1007  HGBA1C 8.8   Fasting Lipid Panel:  Recent Labs  12/19/13 1106  CHOL 124  HDL 55  LDLCALC 59  TRIG 48  CHOLHDL 2.3    Imaging results:  Dg Chest 2 View  12/21/2013   CLINICAL DATA:  Chest pain  EXAM: CHEST  2 VIEW  COMPARISON:  03/18/2007  FINDINGS: The heart size and mediastinal contours are within normal limits. Both lungs are clear. The visualized skeletal structures show postsurgical changes in the lumbar spine and both shoulders.  IMPRESSION: No active cardiopulmonary disease.   Electronically Signed   By: Inez Catalina M.D.   On: 12/21/2013 18:26   Other results: EKG: NSR, 68 bpm  Assessment & Plan by Problem: 68 year old woman with a PMH of DM type 2 (HgbA1c 8.17 December 2013), HTN, non-obstructive CAD (per 99991111 cath), diastolic HF (EF 123456 ECHO 0000000) and chronic pain syndrome who presents with L sided chest pain.  Chest pain:  Chest pain free since admission.  Prior cath in 2006 demonstrated non-obstructive CAD.  Given her risk factors and TIMI 4 (20% mortality risk), HEART score 5 (moderate risk of adverse event) will need ACS work-up.   AS also on differential given systolic murmur, dyspnea; no syncope but reports occasional  lightheadedness.  Wells score 0; no pleuritic chest pain, sinus tachycardia or hypoxia to suggest PE. - admit to 3W on telemetry - CE x 3 - AM EKG - 2D ECHO - continue ASA, BB, amlodipine, Lotensin, Lasix, Imdur, Lipitor - NTG prn - depending on work-up consider inpatient stress test  Chronic diastolic HF, possible exacerbation:  ECHO with EF 65-70%, grade 1 DD in 2012.  C/o DOE and lower extremity edema (chronic issue per patient), exam significant for JVD and lower extremity edema concerning for worsening heart failure.  However, proBNP 47.2 and no acute findings on CXR.  Patient does not think she has gained weight. - gave additional 40mg  po Lasix - 2D ECHO - I&Os and daily weights  Hypomagnesemia:  Chronically low; patient on Mg supplement.  Last check in clinic on 03/10 was 1.3. - checking Mg; supplement as needed  Chronic pain syndrome:  She is s/p L2-L5 fusion  several years ago.  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 - continue Voltaren gel, gabapentin - holding Mobic in the setting of possible ACS   DM type 2:  Stable.  Last Hgb A1c 8.8.  On 20 units Lantus qHS and metformin 1000mg  BID. - SSI - Lantus 10 units qHS  HTN:  Stable.  Continue home meds: amlodipine, atenolol, benazepril, Lasix.   Diet:  Carb modified  VTE ppx:  Lovenox  Dispo: Disposition is deferred at this time, awaiting improvement of current medical problems. Anticipated discharge in approximately 1-2 day(s).   The patient does have a current PCP Bartholomew Crews, MD) and does need an [facility] hospital follow-up appointment after discharge.  The patient does not know have transportation limitations that hinder transportation to clinic appointments.  Signed: Duwaine Maxin, DO 12/21/2013, 11:12 PM

## 2013-12-21 NOTE — ED Notes (Signed)
Dr. Curly Rim at bedside.

## 2013-12-22 ENCOUNTER — Other Ambulatory Visit: Payer: Self-pay

## 2013-12-22 DIAGNOSIS — E785 Hyperlipidemia, unspecified: Secondary | ICD-10-CM

## 2013-12-22 DIAGNOSIS — R072 Precordial pain: Secondary | ICD-10-CM | POA: Diagnosis not present

## 2013-12-22 DIAGNOSIS — R079 Chest pain, unspecified: Secondary | ICD-10-CM

## 2013-12-22 DIAGNOSIS — I2510 Atherosclerotic heart disease of native coronary artery without angina pectoris: Secondary | ICD-10-CM

## 2013-12-22 LAB — BASIC METABOLIC PANEL
BUN: 12 mg/dL (ref 6–23)
CO2: 29 mEq/L (ref 19–32)
Calcium: 8.9 mg/dL (ref 8.4–10.5)
Chloride: 98 mEq/L (ref 96–112)
Creatinine, Ser: 0.68 mg/dL (ref 0.50–1.10)
GFR calc Af Amer: >90 mL/min (ref >90)
GFR calc non Af Amer: 88 mL/min — ABNORMAL LOW (ref >90)
Glucose, Bld: 357 mg/dL — ABNORMAL HIGH (ref 70–99)
Potassium: 4.4 mEq/L (ref 3.7–5.3)
Sodium: 140 mEq/L (ref 137–147)

## 2013-12-22 LAB — GLUCOSE, CAPILLARY
Glucose-Capillary: 152 mg/dL — ABNORMAL HIGH (ref 70–99)
Glucose-Capillary: 209 mg/dL — ABNORMAL HIGH (ref 70–99)
Glucose-Capillary: 212 mg/dL — ABNORMAL HIGH (ref 70–99)

## 2013-12-22 LAB — PRO B NATRIURETIC PEPTIDE: Pro B Natriuretic peptide (BNP): 47.2 pg/mL (ref 0–125)

## 2013-12-22 LAB — MAGNESIUM: Magnesium: 1.4 mg/dL — ABNORMAL LOW (ref 1.5–2.5)

## 2013-12-22 LAB — TROPONIN I
Troponin I: <0.3 ng/mL (ref <0.30)
Troponin I: <0.3 ng/mL (ref <0.30)

## 2013-12-22 MED ORDER — PAROXETINE HCL 20 MG PO TABS
40.0000 mg | ORAL_TABLET | Freq: Every day | ORAL | Status: DC
  Administered 2013-12-22: 40 mg via ORAL
  Filled 2013-12-22: qty 2

## 2013-12-22 MED ORDER — GABAPENTIN 400 MG PO CAPS
400.0000 mg | ORAL_CAPSULE | Freq: Three times a day (TID) | ORAL | Status: DC
  Administered 2013-12-22 (×2): 400 mg via ORAL
  Filled 2013-12-22 (×4): qty 1

## 2013-12-22 MED ORDER — ISOSORBIDE MONONITRATE ER 30 MG PO TB24
30.0000 mg | ORAL_TABLET | Freq: Every day | ORAL | Status: DC
  Administered 2013-12-22: 30 mg via ORAL
  Filled 2013-12-22: qty 1

## 2013-12-22 MED ORDER — ASPIRIN 81 MG PO CHEW
81.0000 mg | CHEWABLE_TABLET | Freq: Every day | ORAL | Status: DC
  Administered 2013-12-22: 81 mg via ORAL
  Filled 2013-12-22: qty 1

## 2013-12-22 MED ORDER — CALCIUM CARBONATE 1250 (500 CA) MG PO TABS
1.0000 | ORAL_TABLET | Freq: Two times a day (BID) | ORAL | Status: DC
  Administered 2013-12-22: 500 mg via ORAL
  Filled 2013-12-22 (×3): qty 1

## 2013-12-22 MED ORDER — FUROSEMIDE 40 MG PO TABS
40.0000 mg | ORAL_TABLET | Freq: Every day | ORAL | Status: DC
Start: 2013-12-22 — End: 2013-12-22
  Administered 2013-12-22: 40 mg via ORAL
  Filled 2013-12-22: qty 1

## 2013-12-22 MED ORDER — ENOXAPARIN SODIUM 40 MG/0.4ML ~~LOC~~ SOLN
40.0000 mg | SUBCUTANEOUS | Status: DC
  Administered 2013-12-22: 40 mg via SUBCUTANEOUS
  Filled 2013-12-22: qty 0.4

## 2013-12-22 MED ORDER — INSULIN GLARGINE 100 UNIT/ML ~~LOC~~ SOLN
10.0000 [IU] | Freq: Once | SUBCUTANEOUS | Status: AC
  Administered 2013-12-22: 10 [IU] via SUBCUTANEOUS
  Filled 2013-12-22: qty 0.1

## 2013-12-22 MED ORDER — FAMOTIDINE 20 MG PO TABS
20.0000 mg | ORAL_TABLET | Freq: Two times a day (BID) | ORAL | Status: DC
  Administered 2013-12-22 (×2): 20 mg via ORAL
  Filled 2013-12-22 (×3): qty 1

## 2013-12-22 MED ORDER — ATORVASTATIN CALCIUM 20 MG PO TABS
20.0000 mg | ORAL_TABLET | Freq: Every day | ORAL | Status: DC
  Administered 2013-12-22: 20 mg via ORAL
  Filled 2013-12-22: qty 1

## 2013-12-22 MED ORDER — MAGNESIUM SULFATE 40 MG/ML IJ SOLN
2.0000 g | Freq: Once | INTRAMUSCULAR | Status: DC
  Filled 2013-12-22: qty 50

## 2013-12-22 MED ORDER — VITAMIN B-12 1000 MCG PO TABS
1000.0000 ug | ORAL_TABLET | Freq: Every day | ORAL | Status: DC
  Administered 2013-12-22: 1000 ug via ORAL
  Filled 2013-12-22: qty 1

## 2013-12-22 MED ORDER — SODIUM CHLORIDE 0.9 % IJ SOLN
3.0000 mL | Freq: Two times a day (BID) | INTRAMUSCULAR | Status: DC
  Administered 2013-12-22: 3 mL via INTRAVENOUS

## 2013-12-22 MED ORDER — MAGNESIUM SULFATE 40 MG/ML IJ SOLN
2.0000 g | Freq: Once | INTRAMUSCULAR | Status: AC
  Administered 2013-12-22: 2 g via INTRAVENOUS
  Filled 2013-12-22: qty 50

## 2013-12-22 MED ORDER — NITROGLYCERIN 0.4 MG SL SUBL: 0.4000 mg | SUBLINGUAL_TABLET | SUBLINGUAL | Status: DC | PRN

## 2013-12-22 MED ORDER — NIVEA EX CREA: 1.0000 "application " | TOPICAL_CREAM | Freq: Two times a day (BID) | CUTANEOUS | Status: DC | PRN

## 2013-12-22 MED ORDER — AMLODIPINE BESYLATE 5 MG PO TABS
5.0000 mg | ORAL_TABLET | Freq: Every day | ORAL | Status: DC
  Administered 2013-12-22: 5 mg via ORAL
  Filled 2013-12-22: qty 1

## 2013-12-22 MED ORDER — FERROUS SULFATE 325 (65 FE) MG PO TABS
325.0000 mg | ORAL_TABLET | Freq: Two times a day (BID) | ORAL | Status: DC
  Administered 2013-12-22: 325 mg via ORAL
  Filled 2013-12-22 (×3): qty 1

## 2013-12-22 MED ORDER — ZOLPIDEM TARTRATE 5 MG PO TABS: 5.0000 mg | ORAL_TABLET | Freq: Every evening | ORAL | Status: DC | PRN

## 2013-12-22 MED ORDER — ATENOLOL 50 MG PO TABS
50.0000 mg | ORAL_TABLET | Freq: Every day | ORAL | Status: DC
  Administered 2013-12-22: 50 mg via ORAL
  Filled 2013-12-22: qty 1

## 2013-12-22 MED ORDER — DICLOFENAC SODIUM 1 % TD GEL
4.0000 g | Freq: Four times a day (QID) | TRANSDERMAL | Status: DC
  Administered 2013-12-22: 4 g via TOPICAL
  Filled 2013-12-22: qty 100

## 2013-12-22 MED ORDER — INSULIN ASPART 100 UNIT/ML ~~LOC~~ SOLN
0.0000 [IU] | Freq: Three times a day (TID) | SUBCUTANEOUS | Status: DC
  Administered 2013-12-22: 3 [IU] via SUBCUTANEOUS
  Administered 2013-12-22: 2 [IU] via SUBCUTANEOUS

## 2013-12-22 MED ORDER — INSULIN GLARGINE 100 UNIT/ML ~~LOC~~ SOLN
10.0000 [IU] | Freq: Every day | SUBCUTANEOUS | Status: DC
Start: 2013-12-22 — End: 2013-12-22
  Filled 2013-12-22: qty 0.1

## 2013-12-22 MED ORDER — VITAMIN D3 25 MCG (1000 UNIT) PO TABS
1000.0000 [IU] | ORAL_TABLET | Freq: Every day | ORAL | Status: DC
  Administered 2013-12-22: 1000 [IU] via ORAL
  Filled 2013-12-22: qty 1

## 2013-12-22 MED ORDER — BENAZEPRIL HCL 40 MG PO TABS
40.0000 mg | ORAL_TABLET | Freq: Every day | ORAL | Status: DC
  Administered 2013-12-22: 40 mg via ORAL
  Filled 2013-12-22: qty 1

## 2013-12-22 MED ORDER — LORATADINE 10 MG PO TABS
10.0000 mg | ORAL_TABLET | Freq: Every day | ORAL | Status: DC | PRN
  Filled 2013-12-22: qty 1

## 2013-12-22 NOTE — Progress Notes (Signed)
Primary Physician: Primary Cardiologist:   HPI:  Patient is a 68 yo who we are asked to see re CP  The patient has a history of nonobstructive CAD by cath in 2005.  Also DM, HTN, diastolic CHF, chronic pain syndrome Yesterday develiped CP L sided  Sharp.  Pain she says started in neck  Down to chest then to leg.  Sharp  CP was worse with turning body, taking big breath  Took NTG  Improved  Continued to occur intermitt.  At rest or with activity.  Went away on own.  In afternoon called EMS Prior to yesterday patient notes no change in her activities  She does have chronic back pains which limit.       Past Medical History  Diagnosis Date  . CAD 08/26/2006    Cath 07/05:multiple areas of nonobstructive disease = medical & RF mgmt, well preserved global systolic function. Dr. Lia Foyer      . DIABETES MELLITUS, TYPE II 08/26/2006    Insulin dependent    . HYPERTENSION 08/26/2006    On BB, ACEI, lasix, and imdur.     . Chronic pain syndrome 01/03/2013    Multifactorial. Non opioid requiring.   L2-5 fusion, with hardware at L2-3, stable per MRI 2010. Followed by neurosurgery (Dr. 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    [name] DEPRESSION 08/26/2006    On paxil    . DYSLIPIDEMIA 11/01/2006    LDL goal < 100, 70 if can achieve without side effects.     . OBESITY 08/26/2006    BMI 39.5. Obesity Class 2.     . GERD 08/26/2006    Heartburn QHS.       Medications Prior to Admission  Medication Sig Dispense Refill  . amLODipine (NORVASC) 5 MG tablet Take 1 tablet (5 mg total) by mouth daily.  90 tablet  3  . aspirin 81 MG tablet Take 1 tablet (81 mg total) by mouth daily.  90 tablet  3  . atenolol (TENORMIN) 100 MG tablet Take 0.5 tablets (50 mg total) by mouth daily.  45 tablet  3  . atorvastatin (LIPITOR) 20 MG tablet Take 1 tablet (20 mg total) by mouth daily.  90 tablet  3  . benazepril (LOTENSIN) 40 MG tablet take 1  tablet by mouth once daily  90 tablet  3  . calcium carbonate (OS-CAL) 600 MG TABS Take 1 tablet (600 mg total) by mouth 2 (two) times daily with a meal.  180 tablet  3  . cetaphil (CETAPHIL) cream Apply 1 application topically 2 (two) times daily as needed (pain).      . cholecalciferol (VITAMIN D) 1000 UNITS tablet Take 1 tablet (1,000 Units total) by mouth daily.  30 tablet  5  . Cyanocobalamin 1000 MCG CAPS Take 1,000 mcg by mouth daily.  90 capsule  3  . diclofenac sodium (VOLTAREN) 1 % GEL Apply 4 g topically 4 (four) times daily. Apply small amount to affected area up to 4 times a day.  100 g  5  . ferrous sulfate 325 (65 FE) MG tablet Take 1 tablet (325 mg total) by mouth 2 (two) times daily with a meal.  180 tablet  3  . furosemide (LASIX) 40 MG tablet Take 1 tablet (40 mg total) by mouth daily.  90 tablet  3  . gabapentin (NEURONTIN) 400 MG capsule take 1 capsule by mouth three times a  day  270 capsule  3  . Insulin Glargine (LANTUS SOLOSTAR) 100 UNIT/ML SOPN Inject 20 Units into the skin at bedtime.  90 mL  3  . isosorbide mononitrate (IMDUR) 30 MG 24 hr tablet Take 1 tablet (30 mg total) by mouth daily.  90 tablet  3  . loratadine (CLARITIN) 10 MG tablet Take 10 mg by mouth daily as needed for allergies.      . Magnesium Chloride (MAG-DELAY) 535 (64 MG) MG TBCR Take 1 tablet (64 mg total) by mouth 2 (two) times daily.  180 tablet  3  . metFORMIN (GLUCOPHAGE) 1000 MG tablet Take 1 tablet (1,000 mg total) by mouth 2 (two) times daily with a meal.  180 tablet  3  . nitroGLYCERIN (NITROSTAT) 0.4 MG SL tablet Place 1 tablet (0.4 mg total) under the tongue every 5 (five) minutes as needed for chest pain.  25 tablet  3  . PARoxetine (PAXIL) 40 MG tablet Take 1 tablet (40 mg total) by mouth every morning.  90 tablet  3  . ranitidine (ZANTAC) 150 MG tablet Take 1 tablet (150 mg total) by mouth 2 (two) times daily. Try once a day in the evening. If not effective, increase to one pill twice a day   180 tablet  3  . zolpidem (AMBIEN CR) 6.25 MG CR tablet Take 1 tablet (6.25 mg total) by mouth at bedtime as needed for sleep.  90 tablet  1     . amLODipine  5 mg Oral Daily  . aspirin  81 mg Oral Daily  . atenolol  50 mg Oral Daily  . atorvastatin  20 mg Oral Daily  . benazepril  40 mg Oral Daily  . calcium carbonate  1 tablet Oral BID WC  . cholecalciferol  1,000 Units Oral Daily  . diclofenac sodium  4 g Topical QID  . enoxaparin (LOVENOX) injection  40 mg Subcutaneous Q24H  . famotidine  20 mg Oral BID  . ferrous sulfate  325 mg Oral BID WC  . furosemide  40 mg Oral Daily  . gabapentin  400 mg Oral TID  . insulin aspart  0-9 Units Subcutaneous TID WC & HS  . isosorbide mononitrate  30 mg Oral Daily  . magnesium sulfate 1 - 4 g bolus IVPB  2 g Intravenous Once  . PARoxetine  40 mg Oral Daily  . sodium chloride  3 mL Intravenous Q12H  . vitamin B-12  1,000 mcg Oral Daily    Infusions:    No Known Allergies  History   Social History  . Marital Status: Single    Spouse Name: N/A    Number of Children: N/A  . Years of Education: N/A   Occupational History  . Not on file.   Social History Main Topics  . Smoking status: Never Smoker   . Smokeless tobacco: Never Used  . Alcohol Use: No  . Drug Use: No  . Sexual Activity: Not on file   Other Topics Concern  . 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     Family History  Problem Relation Age of Onset  . Hypertension Sister   . Hodgkin's lymphoma Sister     REVIEW OF SYSTEMS:  All systems reviewed  Negative to the above problem except as noted above.    PHYSICAL EXAM: Filed Vitals:   12/22/13 0443  BP: 142/71  Pulse: 74  Temp: 98.3 F (  36.8 C)  Resp: 18     Intake/Output Summary (Last 24 hours) at 12/22/13 0837 Last data filed at 12/22/13 0500  Gross per 24 hour  Intake    480 ml  Output      0 ml  Net    480 ml    General:  Patient is a 68 yo  morbidly obese pt in NAD HEENT: normal Neck: supple. no JVD. Carotids 2+ bilat; no bruits. No lymphadenopathy or thryomegaly appreciated. Cor: PMI nondisplaced. Regular rate & rhythm. II/VI systolic murmur at base   Lungs: clear Chest Very mild tenderness under left breast  Abdomen: soft, nontender, nondistended. No hepatosplenomegaly. No bruits or masses. Good bowel sounds. Extremities: no cyanosis, clubbing, rash, edema Neuro: alert & oriented x 3, cranial nerves grossly intact. moves all 4 extremities w/o difficulty. Affect pleasant.  ECG:  SR 68 bpm.    Results for orders placed during the hospital encounter of 12/21/13 (from the past 24 hour(s))  CBC     Status: None   Collection Time    12/21/13  6:01 PM      Result Value Ref Range   WBC 6.9  4.0 - 10.5 K/uL   RBC 4.55  3.87 - 5.11 MIL/uL   Hemoglobin 13.3  12.0 - 15.0 g/dL   HCT 38.1  36.0 - 46.0 %   MCV 83.7  78.0 - 100.0 fL   MCH 29.2  26.0 - 34.0 pg   MCHC 34.9  30.0 - 36.0 g/dL   RDW 14.7  11.5 - 15.5 %   Platelets 303  150 - 400 K/uL  COMPREHENSIVE METABOLIC PANEL     Status: Abnormal   Collection Time    12/21/13  6:01 PM      Result Value Ref Range   Sodium 140  137 - 147 mEq/L   Potassium 3.8  3.7 - 5.3 mEq/L   Chloride 97  96 - 112 mEq/L   CO2 30  19 - 32 mEq/L   Glucose, Bld 122 (*) 70 - 99 mg/dL   BUN 12  6 - 23 mg/dL   Creatinine, Ser 0.57  0.50 - 1.10 mg/dL   Calcium 9.4  8.4 - 10.5 mg/dL   Total Protein 7.7  6.0 - 8.3 g/dL   Albumin 3.8  3.5 - 5.2 g/dL   AST 15  0 - 37 U/L   ALT 14  0 - 35 U/L   Alkaline Phosphatase 87  39 - 117 U/L   Total Bilirubin 0.7  0.3 - 1.2 mg/dL   GFR calc non Af Amer >90  >90 mL/min   GFR calc Af Amer >90  >90 mL/min  I-STAT TROPOININ, ED     Status: None   Collection Time    12/21/13  6:14 PM      Result Value Ref Range   Troponin i, poc 0.00  0.00 - 0.08 ng/mL   Comment 3           GLUCOSE, CAPILLARY     Status: Abnormal   Collection Time    12/22/13 12:12 AM       Result Value Ref Range   Glucose-Capillary 209 (*) 70 - 99 mg/dL   Comment 1 Notify RN    PRO B NATRIURETIC PEPTIDE     Status: None   Collection Time    12/22/13  4:42 AM      Result Value Ref Range   Pro B Natriuretic peptide (BNP) 47.2  0 -  125 pg/mL  BASIC METABOLIC PANEL     Status: Abnormal   Collection Time    12/22/13  4:42 AM      Result Value Ref Range   Sodium 140  137 - 147 mEq/L   Potassium 4.4  3.7 - 5.3 mEq/L   Chloride 98  96 - 112 mEq/L   CO2 29  19 - 32 mEq/L   Glucose, Bld 357 (*) 70 - 99 mg/dL   BUN 12  6 - 23 mg/dL   Creatinine, Ser 0.68  0.50 - 1.10 mg/dL   Calcium 8.9  8.4 - 10.5 mg/dL   GFR calc non Af Amer 88 (*) >90 mL/min   GFR calc Af Amer >90  >90 mL/min  TROPONIN I     Status: None   Collection Time    12/22/13  4:42 AM      Result Value Ref Range   Troponin I <0.30  <0.30 ng/mL  MAGNESIUM     Status: Abnormal   Collection Time    12/22/13  4:42 AM      Result Value Ref Range   Magnesium 1.4 (*) 1.5 - 2.5 mg/dL  GLUCOSE, CAPILLARY     Status: Abnormal   Collection Time    12/22/13  7:29 AM      Result Value Ref Range   Glucose-Capillary 212 (*) 70 - 99 mg/dL   Comment 1 Documented in Chart     Dg Chest 2 View  12/21/2013   CLINICAL DATA:  Chest pain  EXAM: CHEST  2 VIEW  COMPARISON:  03/18/2007  FINDINGS: The heart size and mediastinal contours are within normal limits. Both lungs are clear. The visualized skeletal structures show postsurgical changes in the lumbar spine and both shoulders.  IMPRESSION: No active cardiopulmonary disease.   Electronically Signed   By: Inez Catalina M.D.   On: 12/21/2013 18:26     ASSESSMENT:  1.  CP  I do not think pain represents cardiac ischemia  I think it is probably musculoskeletal  As worse with twisting  I can elicit mild discomfort with palpation. Patient has had PT in past.  She may benefit from some myofasclial PT in future given chronic back/neck problems.    2.  CAD  WIll make sure she has f/u  in clinic for long term  3.  DM  Needs tighter control of glu  4.  Hx diastolic CHF  VOlume status looks good.  5.  HL  Keep on statin.    Please call with questions.  Dorris Carnes

## 2013-12-22 NOTE — Discharge Instructions (Signed)
Spinal Stenosis Spinal stenosis is an abnormal narrowing of the canals of your spine (vertebrae). CAUSES  Spinal stenosis is caused by areas of bone pushing into the central canals of your vertebrae. This condition can be present at birth (congenital). It also may be caused by arthritic deterioration of your vertebrae (spinal degeneration).  SYMPTOMS   Pain that is generally worse with activities, particularly standing and walking.  Numbness, tingling, hot or cold sensations, weakness, or weariness in your legs.  Frequent episodes of falling.  A foot-slapping gait that leads to muscle weakness. DIAGNOSIS  Spinal stenosis is diagnosed with the use of magnetic resonance imaging (MRI) or computed tomography (CT). TREATMENT  Initial therapy for spinal stenosis focuses on the management of the pain and other symptoms associated with the condition. These therapies include:  Practicing postural changes to lessen pressure on your nerves.  Exercises to strengthen the core of your body.  Loss of excess body weight.  The use of nonsteroidal anti-inflammatory medications to reduce swelling and inflammation in your nerves. When therapies to manage pain are not successful, surgery to treat spinal stenosis may be recommended. This surgery involves removing excess bone, which puts pressure on your nerve roots. During this surgery (laminectomy), the posterior boney arch (lamina) and excess bone around the facet joints are removed. Document Released: 12/19/2003 Document Revised: 01/23/2013 Document Reviewed: 01/06/2013 [facility] Patient Information 2014 [place].  Syncope Syncope means a person passes out (faints). The person usually wakes up in less than 5 minutes. It is important to seek medical care for syncope. HOME CARE  Have someone stay with you until you feel normal.  Do not drive, use machines, or play sports until your doctor says it is okay.  Keep all doctor visits as told.  Lie  down when you feel like you might pass out. Take deep breaths. Wait until you feel normal before standing up.  Drink enough fluids to keep your pee (urine) clear or pale yellow.  If you take blood pressure or heart medicine, get up slowly. Take several minutes to sit and then stand. GET HELP RIGHT AWAY IF:   You have a severe headache.  You have pain in the chest, belly (abdomen), or back.  You are bleeding from the mouth or butt (rectum).  You have black or tarry poop (stool).  You have an irregular or very fast heartbeat.  You have pain with breathing.  You keep passing out, or you have shaking (seizures) when you pass out.  You pass out when sitting or lying down.  You feel confused.  You have trouble walking.  You have severe weakness.  You have vision problems. If you fainted, call your local emergency services (911 in [place] Do not drive yourself to the hospital. MAKE SURE YOU:   Understand these instructions.  Will watch your condition.  Will get help right away if you are not doing well or get worse. Document Released: 03/16/2008 Document Revised: 03/29/2012 Document Reviewed: 11/27/2011 [facility] Patient Information 2014 [place], [place].

## 2013-12-22 NOTE — Progress Notes (Signed)
Inpatient Diabetes Program Recommendations  AACE/ADA: New Consensus Statement on Inpatient Glycemic Control (2013)  Target Ranges:  Prepandial:   less than 140 mg/dL      Peak postprandial:   less than 180 mg/dL (1-2 hours)      Critically ill patients:  140 - 180 mg/dL     Results for [name] [name], [name] [name] (MRN [id number]) as of 12/22/2013 07:50  Ref. Range 12/19/2013 10:07  Hemoglobin A1C Latest Range: <5.7 % 8.8     **Pt admitted with CP.  History of Type 2 DM, HTN, CAD.  **Home DM meds:  Lantus 20 units QHS + Metformin 1000 mg bid   **Noted pt seen by her PCP Dr. Lynnae January in the [facility]MT[facility] clinic on 12/19/13.  Per Dr. Zenovia Jarred notes, pt's A1c elevated since last visit.  Dr. Lynnae January gave pt the option to 1) add prandial insulin to her home medications or 2) Increase her exercise and leave her medications the same.  Pt opted for increased exercise and Dr. Lynnae January completed the SCAT form for pt so she will have transportation to the [facility].   **Note pt to start [place] today.  Scheduled to get a one time dose of Lantus 10 units this AM.    MD, May want to consider adding standing Lantus dose at 1/2 home dose to start.   Will follow. Wyn Quaker RN, MSN, CDE Diabetes Coordinator Inpatient Diabetes Program Team Pager: [phone] (8a-10p)

## 2013-12-22 NOTE — H&P (Addendum)
Internal Medicine Attending Admission Note Date: 12/22/2013  Patient name: [name] [name] Medical record number: [id number] Date of birth: Nov 22, 1945 Age: 68 y.o. Gender: female  I saw and evaluated the patient. I reviewed the resident's note and I agree with the resident's findings and plan as documented in the resident's note, with the following additional comments.  Chief Complaint(s): Chest pain  History - key components related to admission: Patient is a 68 year old woman with history of type 2 diabetes mellitus, hypertension, nonobstructive coronary artery disease by cardiac catheterization in 1017, diastolic heart failure, and other problems as outlined in the medical history, admitted with complaint of left chest pain.  Patient describes intermittent episodes of sharp pain under her left breast which started yesterday morning and occurred throughout the day.  The pain was nonexertional; she took 2 sublingual nitroglycerin at home, and reports relief of the pain about one hour after the last nitroglycerin.  She has had no recurrent pain since her hospital admission.  Patient also reports left hand tingling and neck pain which started about one week ago, but feels that this is a separate complaint from her chest pain.   Physical Exam - key components related to admission:  Filed Vitals:   12/22/13 0853 12/22/13 0856 12/22/13 0858 12/22/13 0901  BP: 135/62 130/59 144/76 154/80  Pulse: 79 81 104 129  Temp: 98.2 F (36.8 C)     TempSrc: Oral     Resp: 20     Height:      Weight:      SpO2: 98%       General: Alert, no acute distress Lungs: Clear Heart: Regular; 2/6 systolic murmur, no S3, no S4 Abdomen: Bowel sounds present, soft, nontender Extremities: No edema  Lab results:   Basic Metabolic Panel:  Recent Labs  12/19/13 1106 12/21/13 1801 12/22/13 0442  NA  --  140 140  K  --  3.8 4.4  CL  --  97 98  CO2  --  30 29  GLUCOSE  --  122* 357*  BUN  --  12 12   CREATININE  --  0.57 0.68  CALCIUM  --  9.4 8.9  MG 1.3*  --  1.4*    Liver Function Tests:  Recent Labs  12/21/13 1801  AST 15  ALT 14  ALKPHOS 87  BILITOT 0.7  PROT 7.7  ALBUMIN 3.8     CBC:  Recent Labs  12/21/13 1801  WBC 6.9  HGB 13.3  HCT 38.1  MCV 83.7  PLT 303     Cardiac Enzymes:  Recent Labs  12/22/13 0442  TROPONINI <0.30    BNP:  Recent Labs  12/22/13 0442  PROBNP 47.2     CBG:  Recent Labs  12/22/13 0012 12/22/13 0729  GLUCAP 209* 212*    Hemoglobin A1C:  Recent Labs  12/19/13 1007  HGBA1C 8.8   Fasting Lipid Panel:  Recent Labs  12/19/13 1106  CHOL 124  HDL 55  LDLCALC 59  TRIG 48  CHOLHDL 2.3    Imaging results:  Dg Chest 2 View  12/21/2013   CLINICAL DATA:  Chest pain  EXAM: CHEST  2 VIEW  COMPARISON:  03/18/2007  FINDINGS: The heart size and mediastinal contours are within normal limits. Both lungs are clear. The visualized skeletal structures show postsurgical changes in the lumbar spine and both shoulders.  IMPRESSION: No active cardiopulmonary disease.   Electronically Signed   By: Linus Mako.D.  On: 12/21/2013 18:26    Other results: EKG 3/12: Normal sinus rhythm; possible left atrial enlargement EKG 3/13: Normal sinus rhythm; normal ECG   Assessment & Plan by Problem:  1.  Chest pain.  Patient presented with intermittent chest pain which is now resolved; there is no evidence of acute MI by enzymes or EKG.  She has risk factors for coronary disease, and had a cardiac catheterization about 10 years ago which showed nonobstructive CAD.  She was seen by cardiologist Dr. Harrington Challenger today, who does not feel that the pain is cardiac in nature and did not recommend further evaluation.  Plans include continue aspirin, beta blocker, and other home medications; optimize treatment of risk factors.  Patient will followup with her cardiologist.  2.  Systolic murmur.  A 2-D echocardiogram is pending.  3.  Neck pain  and left hand tingling.  This likely represents cervical spondylosis or cervical disc disease; a prior MRI scan of the neck done in March of 2010 showed a disc herniation at the C6-7 level as well spondylosis.  Patient was apparently referred to neurosurgeon Dr. Sherley Bounds in October of 2010.  Would try to obtain further information about his evaluation and management.   Currently her upper extremity strength is intact and her symptoms appear mild.  This should be followed up in the outpatient clinic, and if her symptoms progress or persist, then repeat imaging of her neck would be indicated; follow-up with her neurosurgeon is also advisable.  4.  Other problems and plans as per the resident physician's note.

## 2013-12-22 NOTE — Discharge Summary (Signed)
Name: [name] [name] MRN: [id number] DOB: 08/24/1946 68 y.o. PCP: Burns Spain, MD  Date of Admission: 12/21/2013  9:09 PM Date of Discharge: 12/22/2013 Attending Physician: Farley Ly, MD  Discharge Diagnosis:  Principal Problem:   Chest pain Active Problems:   DIABETES MELLITUS, TYPE II   HYPOMAGNESEMIA   OBESITY   HYPERTENSION   CAD   Chronic pain syndrome   Diastolic CHF, chronic  Discharge Medications:   Medication List    STOP taking these medications       meloxicam 7.5 MG tablet  Commonly known as:  MOBIC      TAKE these medications       amLODipine 5 MG tablet  Commonly known as:  NORVASC  Take 1 tablet (5 mg total) by mouth daily.     aspirin 81 MG tablet  Take 1 tablet (81 mg total) by mouth daily.     atenolol 100 MG tablet  Commonly known as:  TENORMIN  Take 0.5 tablets (50 mg total) by mouth daily.     atorvastatin 20 MG tablet  Commonly known as:  LIPITOR  Take 1 tablet (20 mg total) by mouth daily.     benazepril 40 MG tablet  Commonly known as:  LOTENSIN  take 1 tablet by mouth once daily     calcium carbonate 600 MG Tabs tablet  Commonly known as:  OS-CAL  Take 1 tablet (600 mg total) by mouth 2 (two) times daily with a meal.     cetaphil cream  Apply 1 application topically 2 (two) times daily as needed (pain).     cholecalciferol 1000 UNITS tablet  Commonly known as:  VITAMIN D  Take 1 tablet (1,000 Units total) by mouth daily.     Cyanocobalamin 1000 MCG Caps  Take 1,000 mcg by mouth daily.     diclofenac sodium 1 % Gel  Commonly known as:  VOLTAREN  Apply 4 g topically 4 (four) times daily. Apply small amount to affected area up to 4 times a day.     ferrous sulfate 325 (65 FE) MG tablet  Take 1 tablet (325 mg total) by mouth 2 (two) times daily with a meal.     furosemide 40 MG tablet  Commonly known as:  LASIX  Take 1 tablet (40 mg total) by mouth daily.     gabapentin 400 MG capsule  Commonly known  as:  NEURONTIN  take 1 capsule by mouth three times a day     Insulin Glargine 100 UNIT/ML Solostar Pen  Commonly known as:  LANTUS SOLOSTAR  Inject 20 Units into the skin at bedtime.     isosorbide mononitrate 30 MG 24 hr tablet  Commonly known as:  IMDUR  Take 1 tablet (30 mg total) by mouth daily.     loratadine 10 MG tablet  Commonly known as:  CLARITIN  Take 10 mg by mouth daily as needed for allergies.     Magnesium Chloride 535 (64 MG) MG Tbcr  Commonly known as:  MAG-DELAY  Take 1 tablet (64 mg total) by mouth 2 (two) times daily.     metFORMIN 1000 MG tablet  Commonly known as:  GLUCOPHAGE  Take 1 tablet (1,000 mg total) by mouth 2 (two) times daily with a meal.     nitroGLYCERIN 0.4 MG SL tablet  Commonly known as:  NITROSTAT  Place 1 tablet (0.4 mg total) under the tongue every 5 (five) minutes as needed for chest  pain.     PARoxetine 40 MG tablet  Commonly known as:  PAXIL  Take 1 tablet (40 mg total) by mouth every morning.     ranitidine 150 MG tablet  Commonly known as:  ZANTAC  Take 1 tablet (150 mg total) by mouth 2 (two) times daily. Try once a day in the evening. If not effective, increase to one pill twice a day     zolpidem 6.25 MG CR tablet  Commonly known as:  AMBIEN CR  Take 1 tablet (6.25 mg total) by mouth at bedtime as needed for sleep.        Disposition and follow-up:   Ms.Rosa L Gibler was discharged from [facility] in Stable condition.  At the hospital follow up visit please address:  1.  Cervical radiculopathy, MSK chest Pain,   2.  Labs / imaging needed at time of follow-up: None  3.  Pending labs/ test needing follow-up: None  Follow-up Appointments:     Follow-up Information   Follow up with Vertell Novak, MD On 12/28/2013. (8:15 am)    Specialty:  Internal Medicine   Contact information:   [place] [address] [phone]       Follow up with Eustace Moore, MD On 01/23/2014. (2:00  pm)    Specialty:  Neurosurgery   Contact information:   [address] [place] [address] [phone]       Discharge Instructions: Discharge Orders   Future Appointments Provider Department Dept Phone   12/28/2013 8:15 AM Olga Millers, MD [place] [phone]   01/01/2014 8:45 AM Thurman Coyer, [place] [phone]   03/27/2014 9:15 AM Bartholomew Crews, [place] [facility] [place] [phone]   Future Orders Complete By Expires   (HEART FAILURE PATIENTS) Call MD:  Anytime you have any of the following symptoms: 1) 3 pound weight gain in 24 hours or 5 pounds in 1 week 2) shortness of breath, with or without a dry hacking cough 3) swelling in the hands, feet or stomach 4) if you have to sleep on extra pillows at night in order to breathe.  As directed    Call MD for:  difficulty breathing, headache or visual disturbances  As directed    Call MD for:  extreme fatigue  As directed    Call MD for:  persistant dizziness or light-headedness  As directed    Call MD for:  severe uncontrolled pain  As directed    Diet - low sodium heart healthy  As directed    Discharge instructions  As directed    Comments:     You have been worked up for your chest pain. We believe that your pain is likely due to the narrowing of your spinal canal. Please follow up with your PCP and neurosurgeon. We have made appointments for you.   Increase activity slowly  As directed       Consultations:    Procedures Performed:  Dg Chest 2 View  12/21/2013   CLINICAL DATA:  Chest pain  EXAM: CHEST  2 VIEW  COMPARISON:  03/18/2007  FINDINGS: The heart size and mediastinal contours are within normal limits. Both lungs are clear. The visualized skeletal structures show postsurgical changes in the lumbar spine and both shoulders.  IMPRESSION: No active cardiopulmonary disease.   Electronically Signed   By: Inez Catalina M.D.   On:  12/21/2013  18:26   Dg Hand Complete Left  12/14/2013   CLINICAL DATA:  Hand pain left greater than right, no trauma  EXAM: LEFT HAND - COMPLETE 3+ VIEW  COMPARISON:  Hand films of 09/06/2012  FINDINGS: As described in the 2013 dictation, there are changes of osteoarthritis primarily involving the DIP joints diffusely. There is more prominent degenerative change involving the articulation of the first metacarpal with the trapezium with loss of joint space, sclerosis, and spurring. Faint chondrocalcinosis is again noted of the triangular fibrocartilage suggesting CPPD as an etiology. No erosion is seen.  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   Electronically Signed   By: Ivar Drape M.D.   On: 12/14/2013 10:04   Dg Hand Complete Right  12/14/2013   CLINICAL DATA:  Hand pain, left greater than right, no trauma  EXAM: RIGHT HAND - COMPLETE 3+ VIEW  COMPARISON:  Right hand films of 09/06/2012  FINDINGS: There has been no change in the previously described changes of osteoarthritis primarily involving the DIP joints of all digits. Mild degenerative change at the right first metacarpal carpal articulation is again noted. Faint chondrocalcinosis is noted in the region of the triangular fibrocartilage suggesting CPPD as an etiology. Degenerative change also is stable involving the right third MCP joint. No erosion is seen.  IMPRESSION: Little change in changes of osteoarthritis as described previously. No erosion   Electronically Signed   By: Ivar Drape M.D.   On: 12/14/2013 10:02    Admission HPI: Ms. Hasten is a 68 year old woman with a PMH of DM type 2 (HgbA1c 8.17 December 2013), HTN, non-obstructive CAD (per 14/4818 cath), diastolic HF (EF 56% ECHO 3149) and chronic pain syndrome who presents with L sided chest pain that started this AM while she was cleaning around her house. The pain is 7/10, sharp and located  beneath the left breast. It radiates to the left jaw and leg and is accompanied by diaphoresis. She denies N/V or abdominal pain. Symptoms improved after 1 NTG, which she took as soon as the pain started around 9AM. The pain would come and go intermittently (both with exertion and at rest) and last for about 5 minutes at a time and resolve spontaneously and with rest. She reports about 1 hour pain-free intervals. The patient took a second NTG at [facility] and called EMS. The patient says she has not had chest pain in awhile and prior to today, had not taken NTG in about 1 year ago. She is compliant with all medications. Dr. Lia Foyer is her cardiologist.   In the ED: T 98.1F, RR 18, SpO2 96%, HR 71, BP 160/72 --> 127/67; she received ASA 324mg .  Hospital Course by problem list: Principal Problem:   Chest pain Active Problems:   DIABETES MELLITUS, TYPE II   HYPOMAGNESEMIA   OBESITY   HYPERTENSION   CAD   Chronic pain syndrome   Diastolic CHF, chronic   Chest pain: Chest pain free since admission. Prior cath in 2006 demonstrated non-obstructive CAD. Given her risk factors and TIMI 4 (20% mortality risk), HEART score 5 (moderate risk of adverse event) we completed ACS work-up. CE's were negative x 3. Wells score 0; no pleuritic chest pain, sinus tachycardia or hypoxia to suggest PE. Cardiology was consulted who evaluated the patient. The pain was felt to be non-cardiac and outpatient f/u was recommended.  Pain was reproduced by neck extension. I scheduled follow  up with patients neurosurgeon about likely cervical radiculopathy.  Chronic diastolic HF, possible exacerbation: ECHO with EF 65-70%, grade 1 DD in 2012. C/o DOE and lower extremity edema (chronic issue per patient), exam significant for JVD and lower extremity edema concerning for worsening heart failure. However, proBNP 47.2 and no acute findings on CXR. Patient does not think she has gained weight. Appears stable. Continue home management.    Chronic pain syndrome: She is s/p L2-L5 fusion several years ago. 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 Continue current management, and I referred patient to Dr. Ronnald Ramp for outpatient eval.   DM type 2: Stable on admission, continued home meds.   HTN: Stable. Continue home meds: amlodipine, atenolol, benazepril, Lasix.    Discharge Vitals:   BP 151/63  Pulse 129  Temp(Src) 98 F (36.7 C) (Oral)  Resp 20  Ht 5\' 4"  (1.626 m)  Wt 228 lb 4.8 oz (103.556 kg)  BMI 39.17 kg/m2  SpO2 97%  Discharge Labs:  Results for orders placed during the hospital encounter of 12/21/13 (from the past 24 hour(s))  CBC     Status: None   Collection Time    12/21/13  6:01 PM      Result Value Ref Range   WBC 6.9  4.0 - 10.5 K/uL   RBC 4.55  3.87 - 5.11 MIL/uL   Hemoglobin 13.3  12.0 - 15.0 g/dL   HCT 38.1  36.0 - 46.0 %   MCV 83.7  78.0 - 100.0 fL   MCH 29.2  26.0 - 34.0 pg   MCHC 34.9  30.0 - 36.0 g/dL   RDW 14.7  11.5 - 15.5 %   Platelets 303  150 - 400 K/uL  COMPREHENSIVE METABOLIC PANEL     Status: Abnormal   Collection Time    12/21/13  6:01 PM      Result Value Ref Range   Sodium 140  137 - 147 mEq/L   Potassium 3.8  3.7 - 5.3 mEq/L   Chloride 97  96 - 112 mEq/L   CO2 30  19 - 32 mEq/L   Glucose, Bld 122 (*) 70 - 99 mg/dL   BUN 12  6 - 23 mg/dL   Creatinine, Ser 0.57  0.50 - 1.10 mg/dL   Calcium 9.4  8.4 - 10.5 mg/dL   Total Protein 7.7  6.0 - 8.3 g/dL   Albumin 3.8  3.5 - 5.2 g/dL   AST 15  0 - 37 U/L   ALT 14  0 - 35 U/L   Alkaline Phosphatase 87  39 - 117 U/L   Total Bilirubin 0.7  0.3 - 1.2 mg/dL   GFR calc non Af Amer >90  >90 mL/min   GFR calc Af Amer >90  >90 mL/min  I-STAT TROPOININ, ED     Status: None   Collection Time    12/21/13  6:14 PM      Result Value Ref Range   Troponin i, poc 0.00  0.00 - 0.08 ng/mL   Comment 3            GLUCOSE, CAPILLARY     Status: Abnormal   Collection Time    12/22/13 12:12 AM      Result Value Ref Range   Glucose-Capillary 209 (*) 70 - 99 mg/dL   Comment 1 Notify RN    PRO  B NATRIURETIC PEPTIDE     Status: None   Collection Time    12/22/13  4:42 AM      Result Value Ref Range   Pro B Natriuretic peptide (BNP) 47.2  0 - 125 pg/mL  BASIC METABOLIC PANEL     Status: Abnormal   Collection Time    12/22/13  4:42 AM      Result Value Ref Range   Sodium 140  137 - 147 mEq/L   Potassium 4.4  3.7 - 5.3 mEq/L   Chloride 98  96 - 112 mEq/L   CO2 29  19 - 32 mEq/L   Glucose, Bld 357 (*) 70 - 99 mg/dL   BUN 12  6 - 23 mg/dL   Creatinine, Ser 0.68  0.50 - 1.10 mg/dL   Calcium 8.9  8.4 - 10.5 mg/dL   GFR calc non Af Amer 88 (*) >90 mL/min   GFR calc Af Amer >90  >90 mL/min  TROPONIN I     Status: None   Collection Time    12/22/13  4:42 AM      Result Value Ref Range   Troponin I <0.30  <0.30 ng/mL  MAGNESIUM     Status: Abnormal   Collection Time    12/22/13  4:42 AM      Result Value Ref Range   Magnesium 1.4 (*) 1.5 - 2.5 mg/dL  GLUCOSE, CAPILLARY     Status: Abnormal   Collection Time    12/22/13  7:29 AM      Result Value Ref Range   Glucose-Capillary 212 (*) 70 - 99 mg/dL   Comment 1 Documented in Chart    TROPONIN I     Status: None   Collection Time    12/22/13  9:18 AM      Result Value Ref Range   Troponin I <0.30  <0.30 ng/mL  GLUCOSE, CAPILLARY     Status: Abnormal   Collection Time    12/22/13 11:23 AM      Result Value Ref Range   Glucose-Capillary 152 (*) 70 - 99 mg/dL   Comment 1 Documented in Chart      Signed: Marrion Coy, MD 12/22/2013, 1:51 PM   Time Spent on Discharge: 35 minutes Services Ordered on Discharge: None Equipment Ordered on Discharge: None

## 2013-12-22 NOTE — Progress Notes (Signed)
Subjective:  NAE ON. Patient feeling much better. States she would like to go home.   Objective: Vital signs in last 24 hours: Filed Vitals:   12/21/13 2346 12/21/13 2356 12/22/13 0011 12/22/13 0443  BP:  127/67 159/80 142/71  Pulse:  78 73 74  Temp: 98 F (36.7 C) 98 F (36.7 C) 98.4 F (36.9 C) 98.3 F (36.8 C)  TempSrc: Oral Oral Oral Oral  Resp:  18 18 18   Height:   5\' 4"  (1.626 m)   Weight:   228 lb 1.6 oz (103.465 kg) 228 lb 4.8 oz (103.556 kg)  SpO2: 99% 99% 100% 95%   Weight change:   Intake/Output Summary (Last 24 hours) at 12/22/13 0753 Last data filed at 12/22/13 0500  Gross per 24 hour  Intake    480 ml  Output      0 ml  Net    480 ml   Physical Exam  Constitutional: She is oriented to person, place, and time.  Cardiovascular: Normal rate, regular rhythm and intact distal pulses.   Murmur heard. Musculoskeletal:  Patient has chest pain with neck extension and twisting of the thorax.  Neurological: She is alert and oriented to person, place, and time.    Lab Results: Basic Metabolic Panel:  Recent Labs Lab 12/19/13 1106 12/21/13 1801 12/22/13 0442  NA  --  140 140  K  --  3.8 4.4  CL  --  97 98  CO2  --  30 29  GLUCOSE  --  122* 357*  BUN  --  12 12  CREATININE  --  0.57 0.68  CALCIUM  --  9.4 8.9  MG 1.3*  --  1.4*   Liver Function Tests:  Recent Labs Lab 12/21/13 1801  AST 15  ALT 14  ALKPHOS 87  BILITOT 0.7  PROT 7.7  ALBUMIN 3.8   No results found for this basename: LIPASE, AMYLASE,  in the last 168 hours No results found for this basename: AMMONIA,  in the last 168 hours CBC:  Recent Labs Lab 12/21/13 1801  WBC 6.9  HGB 13.3  HCT 38.1  MCV 83.7  PLT 303   Cardiac Enzymes:  Recent Labs Lab 12/22/13 0442  TROPONINI <0.30   BNP:  Recent Labs Lab 12/22/13 0442  PROBNP 47.2   D-Dimer: No results found for this basename: DDIMER,  in the last 168 hours CBG:  Recent Labs Lab 12/19/13 0959 12/22/13 0012  12/22/13 0729  GLUCAP 101* 209* 212*   Hemoglobin A1C:  Recent Labs Lab 12/19/13 1007  HGBA1C 8.8   Fasting Lipid Panel:  Recent Labs Lab 12/19/13 1106  CHOL 124  HDL 55  LDLCALC 59  TRIG 48  CHOLHDL 2.3   Thyroid Function Tests: No results found for this basename: TSH, T4TOTAL, FREET4, T3FREE, THYROIDAB,  in the last 168 hours Coagulation: No results found for this basename: LABPROT, INR,  in the last 168 hours Anemia Panel: No results found for this basename: VITAMINB12, FOLATE, FERRITIN, TIBC, IRON, RETICCTPCT,  in the last 168 hours Urine Drug Screen: Drugs of Abuse  No results found for this basename: labopia, cocainscrnur, labbenz, amphetmu, thcu, labbarb    Alcohol Level: No results found for this basename: ETH,  in the last 168 hours Urinalysis: No results found for this basename: COLORURINE, APPERANCEUR, LABSPEC, PHURINE, GLUCOSEU, HGBUR, BILIRUBINUR, KETONESUR, PROTEINUR, UROBILINOGEN, NITRITE, LEUKOCYTESUR,  in the last 168 hours   Micro Results: No results found for this or any  previous visit (from the past 240 hour(s)). Studies/Results: Dg Chest 2 View  12/21/2013   CLINICAL DATA:  Chest pain  EXAM: CHEST  2 VIEW  COMPARISON:  03/18/2007  FINDINGS: The heart size and mediastinal contours are within normal limits. Both lungs are clear. The visualized skeletal structures show postsurgical changes in the lumbar spine and both shoulders.  IMPRESSION: No active cardiopulmonary disease.   Electronically Signed   By: Inez Catalina M.D.   On: 12/21/2013 18:26   Medications: I have reviewed the patient's current medications. Scheduled Meds: . amLODipine  5 mg Oral Daily  . aspirin  81 mg Oral Daily  . atenolol  50 mg Oral Daily  . atorvastatin  20 mg Oral Daily  . benazepril  40 mg Oral Daily  . calcium carbonate  1 tablet Oral BID WC  . cholecalciferol  1,000 Units Oral Daily  . diclofenac sodium  4 g Topical QID  . enoxaparin (LOVENOX) injection  40 mg  Subcutaneous Q24H  . famotidine  20 mg Oral BID  . ferrous sulfate  325 mg Oral BID WC  . furosemide  40 mg Oral Daily  . gabapentin  400 mg Oral TID  . insulin aspart  0-9 Units Subcutaneous TID WC & HS  . insulin glargine  10 Units Subcutaneous Once  . isosorbide mononitrate  30 mg Oral Daily  . magnesium sulfate 1 - 4 g bolus IVPB  2 g Intravenous Once  . PARoxetine  40 mg Oral Daily  . sodium chloride  3 mL Intravenous Q12H  . vitamin B-12  1,000 mcg Oral Daily   Continuous Infusions:  PRN Meds:.loratadine, nitroGLYCERIN, nivea, zolpidem Assessment/Plan: Principal Problem:   Chest pain Active Problems:   DIABETES MELLITUS, TYPE II   OBESITY   HYPERTENSION   CAD   Chronic pain syndrome   Diastolic CHF, chronic  68 year old woman with a PMH of DM type 2 (HgbA1c 8.17 December 2013), HTN, non-obstructive CAD (per 75/6433 cath), diastolic HF (EF 29% ECHO 5188) and chronic pain syndrome who presents with L sided chest pain.   Chest pain: Chest pain free since admission. Prior cath in 2006 demonstrated non-obstructive CAD. Given her risk factors and TIMI 4 (20% mortality risk), HEART score 5 (moderate risk of adverse event) will need ACS work-up. AS also on differential given systolic murmur, dyspnea; no syncope but reports occasional lightheadedness. Wells score 0; no pleuritic chest pain, sinus tachycardia or hypoxia to suggest PE. Cardiology was consulted who evaluated the patient. The pain was felt to be non-cardiac and outpatient f/u was recommended. CE negative x 3. Pain was reproduced by neck extension.  Chronic diastolic HF, possible exacerbation: ECHO with EF 65-70%, grade 1 DD in 2012. C/o DOE and lower extremity edema (chronic issue per patient), exam significant for JVD and lower extremity edema concerning for worsening heart failure. However, proBNP 47.2 and no acute findings on CXR. Patient does not think she has gained weight. Appears stable. Continue home  management.  Hypomagnesemia: Replete as indicated.  Chronic pain syndrome: She is s/p L2-L5 fusion several years ago. 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  - continue Voltaren gel, gabapentin, mobic - referred patient to Dr. Ronnald Ramp for outpatient eval.  DM type 2: Stable. Last Hgb A1c 8.8. On 20 units Lantus qHS and metformin 1000mg  BID.  - SSI  -  Lantus 10 units qHS   HTN: Stable. Continue home meds: amlodipine, atenolol, benazepril, Lasix.   Diet: Carb modified   VTE ppx: Lovenox   Dispo: Anticipated discharge in approximately today.   The patient does have a current PCP Bartholomew Crews, MD) and does need an [facility] hospital follow-up appointment after discharge.  The patient does not have transportation limitations that hinder transportation to clinic appointments.  .Services Needed at time of discharge: Y = Yes, Blank = No PT:   OT:   RN:   Equipment:   Other:     LOS: 1 day   Marrion Coy, MD 12/22/2013, 7:53 AM

## 2013-12-22 NOTE — Progress Notes (Signed)
UR Completed Abrea Henle Graves-Bigelow, RN,BSN [phone]  

## 2013-12-25 ENCOUNTER — Other Ambulatory Visit: Payer: Self-pay | Admitting: *Deleted

## 2013-12-25 MED ORDER — MAGNESIUM CHLORIDE ER 535 (64 MG) MG PO TBCR: 1.0000 | EXTENDED_RELEASE_TABLET | Freq: Two times a day (BID) | ORAL | Status: DC

## 2013-12-25 NOTE — Telephone Encounter (Signed)
Call from pt - states she received a letter about her magnesium level  Being slightly low and how to take it but she's out of refills. Thanks

## 2013-12-28 ENCOUNTER — Ambulatory Visit (INDEPENDENT_AMBULATORY_CARE_PROVIDER_SITE_OTHER): Payer: PRIVATE HEALTH INSURANCE | Admitting: Internal Medicine

## 2013-12-28 ENCOUNTER — Encounter: Payer: Self-pay | Admitting: Internal Medicine

## 2013-12-28 VITALS — BP 140/76 | HR 87 | Temp 98.4°F | Wt 226.5 lb

## 2013-12-28 DIAGNOSIS — I2510 Atherosclerotic heart disease of native coronary artery without angina pectoris: Secondary | ICD-10-CM

## 2013-12-28 DIAGNOSIS — E119 Type 2 diabetes mellitus without complications: Secondary | ICD-10-CM

## 2013-12-28 NOTE — Assessment & Plan Note (Signed)
She continues to take her lantus and metformin and we discussed healthy eating choices and lifestyle changes. [facility] which will be good for her back and joints as well.

## 2013-12-28 NOTE — Patient Instructions (Signed)
We would like you to go see the heart doctor in the next month or so.  [facility] and [facility].  Come back with Dr. Lynnae January as scheduled. Call us with problems or questions at [phone].  Please bring your medicines with you each time you come.   Medicines may be  Eye drops  Herbal   Vitamins  Pills  Seeing these help [place] take care of you.  For Food: eat as much vegetables as you want, sweet potatoes are better than white potatoes, when you eat meat grilled chicken is good for you.   Exercise is important to help your body use the sugar you eat better and to lose some weight so your joints will feel much better.

## 2013-12-28 NOTE — Assessment & Plan Note (Signed)
On ASA, beta blocker, statin. Will have visit with cardiology in April. No further chest pain since leaving hospital.

## 2013-12-28 NOTE — Progress Notes (Signed)
Subjective:     Patient ID: [name] [name], female   DOB: 1946/09/21, 68 y.o.   MRN: [id number]  HPI The patient is coming in for hospital follow up. She was hospitalized with musculoskeletal chest pain which was evaluated. She will follow up with cardiology as an outpatient. She has not had any more chest pain since leaving the hospital. She is doing well back at home. She has not fallen. She is still watching her sugars and they are about the same and [facility]. She wants to know more information about what foods are good to eat. She is telling me that she eats a lot of vegetables and no fried foods. She is drinking mostly skim milk and small amounts of orange and apple juice.   Review of Systems  Constitutional: Negative for fever, chills, diaphoresis, activity change, appetite change, fatigue and unexpected weight change.  Respiratory: Negative for cough, chest tightness, shortness of breath and wheezing.   Cardiovascular: Negative for chest pain, palpitations and leg swelling.  Gastrointestinal: Negative for nausea, vomiting, abdominal pain, diarrhea and constipation.  Musculoskeletal: Positive for arthralgias, back pain and gait problem. Negative for joint swelling, myalgias, neck pain and neck stiffness.       Uses cane  Neurological: Negative for dizziness, tremors, seizures, syncope, facial asymmetry, speech difficulty, weakness, light-headedness, numbness and headaches.       Objective:   Physical Exam  Constitutional: She is oriented to person, place, and time. She appears well-developed and well-nourished. No distress.  HENT:  Head: Normocephalic and atraumatic.  Eyes: EOM are normal. Pupils are equal, round, and reactive to light.  Neck: Normal range of motion. Neck supple. No JVD present. No tracheal deviation present. No thyromegaly present.  Cardiovascular: Normal rate and regular rhythm.   No murmur  heard. Pulmonary/Chest: Effort normal and breath sounds normal. No respiratory distress. She has no wheezes. She has no rales. She exhibits tenderness.  Minimal tenderness with deep palpation left chest wall.  Abdominal: Soft. Bowel sounds are normal. She exhibits no distension. There is no tenderness. There is no rebound and no guarding.  Musculoskeletal: Normal range of motion.  Uses cane for ambulation, no falls recently  Lymphadenopathy:    She has no cervical adenopathy.  Neurological: She is alert and oriented to person, place, and time. No cranial nerve deficit.  Skin: Skin is warm and dry. No rash noted. She is not diaphoretic. No erythema. No pallor.       Assessment/Plan:   1. Hospital follow up - No appointment noted in Epic so called heart group and scheduled her follow up appointment with cardiology. She has not had more pain since leaving. She does have nitro at home if she needs it. She is on ASA and beta blocker and previous cath with non-obstructive disease.   2. Disposition - Patient will return as scheduled with PCP. No changes or labs today. Apt with cardiology scheduled and given to patient.

## 2013-12-29 NOTE — Progress Notes (Signed)
Case discussed with Dr. Kollar soon after the resident saw the patient.  We reviewed the resident's history and exam and pertinent patient test results.  I agree with the assessment, diagnosis, and plan of care documented in the resident's note. 

## 2014-01-01 ENCOUNTER — Ambulatory Visit (INDEPENDENT_AMBULATORY_CARE_PROVIDER_SITE_OTHER): Payer: PRIVATE HEALTH INSURANCE | Admitting: Sports Medicine

## 2014-01-01 ENCOUNTER — Encounter: Payer: Self-pay | Admitting: Sports Medicine

## 2014-01-01 VITALS — BP 137/71 | Ht 64.0 in | Wt 232.0 lb

## 2014-01-01 DIAGNOSIS — M19049 Primary osteoarthritis, unspecified hand: Secondary | ICD-10-CM

## 2014-01-01 MED ORDER — TRAMADOL HCL 50 MG PO TABS: 50.0000 mg | ORAL_TABLET | Freq: Two times a day (BID) | ORAL | Status: DC | PRN

## 2014-01-01 NOTE — Patient Instructions (Signed)
You have been scheduled for an appointment with Dr. Burney Gauze 01/02/14 at 8:50 am   Their address is  [address]  [place], [address]  [phone]  Please bring a copy of your hand x-ray an a CD

## 2014-01-01 NOTE — Progress Notes (Addendum)
   Subjective:    Patient ID: [name] [name], female    DOB: 10/14/1945, 68 y.o.   MRN: [id number]  HPI  [name] returns for follow-up of left shoulder pain which was injected 3 weeks ago and bilateral hand pain. She reports complete improvement in shoulder pain. Her hands are unchanged. She continues to take 400 MG Ibuprofen Q6H which typically takes away the pain. Pain is worse with any use of her hands. She is wearing "Copper Hands" arthritis support gloves which improves her symptoms. The pain in the right is worse than the left. And 3rd MCP of the right and Left 1st [facility] are the worst locations.   She is also still having pain in her left elbow. Pain is worse with flexion of the elbow and is located across the entire posterior joint.   Review of Systems Negative apart from HPI     Objective:   Physical Exam  Shoulder: ROM significantly improved with full abduction and flexion  Elbow Inspection: no swelling, erythema Palpation: Tenderness to palpation along olecranon, medial and lateral epicondyl.  ROM: Normal bilaterally. Mild pain with pronation  Hands Inspection: Right hand with 3rd digit unable to fully close in fist Palpation: Some tenderness along RIGHT 3rd MCP and LEFT 1st CMC ROM: Limited flexion of right 3rd digit. Normal wrist flexion, extension. Normal finger abduction. Normal 1st digit opposition.  Left hand X-ray 12/14/2013: Advanced CMC DJD  Right hand X-ray 12/14/2013: No change since previous film. Right hand X-ray 08/2012: IMPRESSION: 1. 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     Assessment & Plan:  1. Chronic bilateral hand pain, secondary to osteoarthritis (severe CMC DJD, left thumb) 2. Resolved left shoulder pain  Although the patient has received positive benefits from cortisone injections in the past, x-rays of her left hand shows advanced DJD of the left CMC joint. Since she is also  complaining of pain in the right hand, also from osteoarthritis, I recommended referral to one of the hand specialist for their input. They may recommend repeating her cortisone injections but definitive treatment for her left thumb may be surgical. Since her shoulder pain has resolved with a recent subacromial cortisone injection, I recommended no further workup or treatment for this currently Followup with me when necessary   Seen with Jacqulyn Liner, MS4

## 2014-01-03 ENCOUNTER — Encounter: Payer: Self-pay | Admitting: Cardiology

## 2014-01-08 ENCOUNTER — Encounter: Payer: Self-pay | Admitting: Internal Medicine

## 2014-01-11 ENCOUNTER — Other Ambulatory Visit: Payer: Self-pay | Admitting: Internal Medicine

## 2014-02-01 ENCOUNTER — Encounter: Payer: PRIVATE HEALTH INSURANCE | Admitting: Cardiology

## 2014-02-06 ENCOUNTER — Encounter: Payer: Self-pay | Admitting: Cardiology

## 2014-02-06 ENCOUNTER — Ambulatory Visit (INDEPENDENT_AMBULATORY_CARE_PROVIDER_SITE_OTHER): Payer: PRIVATE HEALTH INSURANCE | Admitting: Cardiology

## 2014-02-06 ENCOUNTER — Ambulatory Visit: Payer: PRIVATE HEALTH INSURANCE | Admitting: Cardiology

## 2014-02-06 ENCOUNTER — Other Ambulatory Visit (HOSPITAL_COMMUNITY): Payer: Self-pay | Admitting: Cardiology

## 2014-02-06 VITALS — BP 120/66 | HR 70 | Ht 64.0 in | Wt 229.0 lb

## 2014-02-06 DIAGNOSIS — I70219 Atherosclerosis of native arteries of extremities with intermittent claudication, unspecified extremity: Secondary | ICD-10-CM

## 2014-02-06 DIAGNOSIS — R0609 Other forms of dyspnea: Secondary | ICD-10-CM

## 2014-02-06 DIAGNOSIS — Z79899 Other long term (current) drug therapy: Secondary | ICD-10-CM

## 2014-02-06 DIAGNOSIS — R0989 Other specified symptoms and signs involving the circulatory and respiratory systems: Secondary | ICD-10-CM

## 2014-02-06 DIAGNOSIS — I739 Peripheral vascular disease, unspecified: Secondary | ICD-10-CM

## 2014-02-06 MED ORDER — POTASSIUM CHLORIDE ER 10 MEQ PO TBCR: 10.0000 meq | EXTENDED_RELEASE_TABLET | Freq: Every day | ORAL | Status: DC

## 2014-02-06 MED ORDER — METOLAZONE 2.5 MG PO TABS: 2.5000 mg | ORAL_TABLET | Freq: Every day | ORAL | Status: DC

## 2014-02-06 NOTE — Patient Instructions (Addendum)
Your physician has recommended you make the following change in your medication:   1. Start Metolazone 2.5 mg 1 tablet daily.  2. Start Klor-Con 10 MEQ 1 tablet daily (potassium pill).  Your physician has requested that you have a lexiscan myoview. For further information please visit [email or web address]. Please follow instruction sheet, as given.  Your physician has requested that you have a lower or upper extremity arterial duplex. This test is an ultrasound of the arteries in the legs or arms. It looks at arterial blood flow in the legs and arms. Allow one hour for Lower and Upper Arterial scans. There are no restrictions or special instructions  Your physician recommends that you return for lab work on the day of your MYOVIEW for BMET.  Your physician recommends that you schedule a follow-up appointment with Dr. Meda Coffee after testing.

## 2014-02-06 NOTE — Progress Notes (Signed)
Patient ID: AMIRA PODOLAK, female   DOB: Nov 30, 1945, 68 y.o.   MRN: [id number]    Patient Name: [name] [name] Date of Encounter: 02/06/2014  Primary Care Provider:  Larey Dresser, MD Primary Cardiologist:  Dorothy Spark  Problem List   Past Medical History  Diagnosis Date  . CAD 08/26/2006    Cath 07/05:multiple areas of nonobstructive disease = medical & RF mgmt, well preserved global systolic function. Dr. Lia Foyer      . DIABETES MELLITUS, TYPE II 08/26/2006    Insulin dependent    . HYPERTENSION 08/26/2006    On BB, ACEI, lasix, and imdur.     . Chronic pain syndrome 01/03/2013    Multifactorial. Non opioid requiring.   L2-5 fusion, with hardware at L2-3, stable per MRI 2010. Followed by neurosurgery (Dr. 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    [name] DEPRESSION 08/26/2006    On paxil    . DYSLIPIDEMIA 11/01/2006    LDL goal < 100, 70 if can achieve without side effects.     . OBESITY 08/26/2006    BMI 39.5. Obesity Class 2.     . GERD 08/26/2006    Heartburn QHS.      Past Surgical History  Procedure Laterality Date  . Posterior lumbar interfusion surgery  07/18/07    L2-L3  . Lumbar fusion surgery  10/08    L3-L5  . Rotator cuff surgery    . Cholecystectomy    . Endometrial biospy      Allergies  No Known Allergies  HPI  This is a very nice patient who used to follow with Dr Lia Foyer for non-obstructive CAD, HTN and hyperlipidemia. Her cath in 2005 showed multiple areas of nonobstructive disease = medical & RF mgmt, well preserved global systolic function. She has been doing well until recently when she developed DOE and pronounced fatigue. She has been very complaining with her medications. She is minimally active as her prior knee surgery is limiting her, however able to do all the house chores. Moderate activity would give significant SOB that is progressively worsening. She is [place] concerned  about LE edema that has been present for the last two months. She has been taking lasix 40 mg po daily chronically. She is also experiencing claudications on short distances. No palpitations or syncope,  PND or orthopnea.   Home Medications  Prior to Admission medications   Medication Sig Start Date End Date Taking? Authorizing Provider  amLODipine (NORVASC) 5 MG tablet Take 1 tablet (5 mg total) by mouth daily. 04/25/13 04/25/14 Yes Bartholomew Crews, MD  aspirin 81 MG tablet Take 1 tablet (81 mg total) by mouth daily. 04/25/13  Yes Bartholomew Crews, MD  atenolol (TENORMIN) 100 MG tablet Take 0.5 tablets (50 mg total) by mouth daily. 04/25/13  Yes Bartholomew Crews, MD  atorvastatin (LIPITOR) 20 MG tablet take 1 tablet by mouth once daily 01/11/14  Yes Bartholomew Crews, MD  benazepril (LOTENSIN) 40 MG tablet take 1 tablet by mouth once daily 04/25/13  Yes Bartholomew Crews, MD  calcium carbonate (OS-CAL) 600 MG TABS Take 1 tablet (600 mg total) by mouth 2 (two) times daily with a meal. 04/25/13  Yes Bartholomew Crews, MD  cetaphil (CETAPHIL) cream Apply 1 application topically 2 (two) times daily as needed (pain).   Yes Historical Provider, MD  cholecalciferol (VITAMIN D) 1000 UNITS tablet Take 1 tablet (1,000 Units total)  by mouth daily. 04/26/13  Yes Bartholomew Crews, MD  Cyanocobalamin 1000 MCG CAPS Take 1,000 mcg by mouth daily. 04/25/13  Yes Bartholomew Crews, MD  diclofenac sodium (VOLTAREN) 1 % GEL Apply 4 g topically 4 (four) times daily. Apply small amount to affected area up to 4 times a day. 04/25/13  Yes Bartholomew Crews, MD  ferrous sulfate 325 (65 FE) MG tablet Take 1 tablet (325 mg total) by mouth 2 (two) times daily with a meal. 04/25/13  Yes Bartholomew Crews, MD  furosemide (LASIX) 40 MG tablet Take 1 tablet (40 mg total) by mouth daily. 04/25/13  Yes Bartholomew Crews, MD  gabapentin (NEURONTIN) 400 MG capsule take 1 capsule by mouth three times a day 04/25/13  Yes  Bartholomew Crews, MD  Insulin Glargine (LANTUS SOLOSTAR) 100 UNIT/ML SOPN Inject 20 Units into the skin at bedtime. 04/25/13  Yes Bartholomew Crews, MD  isosorbide mononitrate (IMDUR) 30 MG 24 hr tablet Take 1 tablet (30 mg total) by mouth daily. 04/25/13  Yes Bartholomew Crews, MD  loratadine (CLARITIN) 10 MG tablet Take 10 mg by mouth daily as needed for allergies.   Yes Historical Provider, MD  Magnesium Chloride (MAG-DELAY) 535 (64 MG) MG TBCR Take 1 tablet (64 mg total) by mouth 2 (two) times daily. 12/25/13  Yes Bartholomew Crews, MD  metFORMIN (GLUCOPHAGE) 1000 MG tablet Take 1 tablet (1,000 mg total) by mouth 2 (two) times daily with a meal. 04/25/13  Yes Bartholomew Crews, MD  nitroGLYCERIN (NITROSTAT) 0.4 MG SL tablet Place 1 tablet (0.4 mg total) under the tongue every 5 (five) minutes as needed for chest pain. 08/30/13  Yes Burnell Blanks, MD  PARoxetine (PAXIL) 40 MG tablet Take 1 tablet (40 mg total) by mouth every morning. 04/25/13  Yes Bartholomew Crews, MD  ranitidine (ZANTAC) 150 MG tablet Take 1 tablet (150 mg total) by mouth 2 (two) times daily. Try once a day in the evening. If not effective, increase to one pill twice a day 04/25/13 04/25/14 Yes Bartholomew Crews, MD  traMADol (ULTRAM) 50 MG tablet Take 1 tablet (50 mg total) by mouth 2 (two) times daily as needed. 01/01/14  Yes Carlos Levering Draper, DO  zolpidem (AMBIEN CR) 6.25 MG CR tablet Take 1 tablet (6.25 mg total) by mouth at bedtime as needed for sleep. 04/25/13 12/21/13  Bartholomew Crews, MD    Family History  Family History  Problem Relation Age of Onset  . Hypertension Sister   . Hodgkin's lymphoma Sister     Social History  History   Social History  . Marital Status: Single    Spouse Name: N/A    Number of Children: N/A  . Years of Education: N/A   Occupational History  . Not on file.   Social History Main Topics  . Smoking status: Never Smoker   . Smokeless tobacco: Never Used  .  Alcohol Use: No  . Drug Use: No  . Sexual Activity: Not on file   Other Topics Concern  . 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      Review of Systems, as per HPI, otherwise negative General:  No chills, fever, night sweats or weight changes.  Cardiovascular:  No chest pain, dyspnea on exertion, edema, orthopnea, palpitations, paroxysmal nocturnal dyspnea. Dermatological: No rash, lesions/masses Respiratory: No cough, dyspnea Urologic: No hematuria, dysuria Abdominal:  No nausea, vomiting, diarrhea, bright red blood per rectum, melena, or hematemesis Neurologic:  No visual changes, wkns, changes in mental status. All other systems reviewed and are otherwise negative except as noted above.  Physical Exam  Blood pressure 120/66, pulse 70, height 5\' 4"  (1.626 m), weight 229 lb (103.874 kg).  General: Pleasant, NAD Psych: Normal affect. Neuro: Alert and oriented X 3. Moves all extremities spontaneously. HEENT: Normal  Neck: Supple without bruits or JVD. Lungs:  Resp regular and unlabored, CTA. Heart: RRR no s3, s4, or murmurs. Abdomen: Soft, non-tender, non-distended, BS + x 4.  Extremities: No clubbing, cyanosis,  B/L LE edema up to the knees, week DP/PT pulses . Radials 2+ and equal bilaterally.  Labs:  No results found for this basename: CKTOTAL, CKMB, TROPONINI,  in the last 72 hours Lab Results  Component Value Date   WBC 6.9 12/21/2013   HGB 13.3 12/21/2013   HCT 38.1 12/21/2013   MCV 83.7 12/21/2013   PLT 303 12/21/2013    No results found for this basename: DDIMER   No components found with this basename: POCBNP,     Component Value Date/Time   NA 140 12/22/2013 0442   K 4.4 12/22/2013 0442   CL 98 12/22/2013 0442   CO2 29 12/22/2013 0442   GLUCOSE 357* 12/22/2013 0442   BUN 12 12/22/2013 0442   CREATININE 0.68 12/22/2013 0442   CREATININE 0.68 04/25/2013 1104   CALCIUM 8.9 12/22/2013 0442   PROT 7.7 12/21/2013 1801    ALBUMIN 3.8 12/21/2013 1801   AST 15 12/21/2013 1801   ALT 14 12/21/2013 1801   ALKPHOS 87 12/21/2013 1801   BILITOT 0.7 12/21/2013 1801   GFRNONAA 88* 12/22/2013 0442   GFRNONAA >89 04/25/2013 1104   GFRAA >90 12/22/2013 0442   GFRAA >89 04/25/2013 1104   Lab Results  Component Value Date   CHOL 124 12/19/2013   HDL 55 12/19/2013   LDLCALC 59 12/19/2013   TRIG 48 12/19/2013    Accessory Clinical Findings  Echocardiogram - 2012 Left ventricle: The cavity size was normal. Wall thickness was normal. Systolic function was vigorous. The estimated ejection fraction was in the range of 65% to 70%. Wall motion was normal; there were no regional wall motion abnormalities. Doppler parameters are consistent with abnormal left ventricular relaxation (grade 1 diastolic dysfunction). - Aortic valve: Trivial regurgitation. - Atrial septum: No defect or patent foramen ovale was identified.  ECG - SR, normal ECG    Assessment & Plan  A very pleasant 68 year old female   1. DOE - h/o non-obstructive CAD by cath in 2005, we will order a Lexiscan nuclear stress test to rule out ischemia.   2. Acute on chronic diastolic CHF - DOE, LE edema - Echo in 2012 showed good LVEF 65-70% and impaired relaxation. We will add metolazone 2.5 mg po daily and KCl 10 mEq daily.   3. Claudications - LE arterial Duplex  4. Hyperlipidemia - continue statin - check CMP, CBC on the day of stress test   Follow up in 4 weeks  Dorothy Spark, MD, [facility] 02/06/2014, 10:15 AM

## 2014-02-13 ENCOUNTER — Other Ambulatory Visit: Payer: Self-pay | Admitting: Neurological Surgery

## 2014-02-13 DIAGNOSIS — M5000 Cervical disc disorder with myelopathy, unspecified cervical region: Secondary | ICD-10-CM

## 2014-02-15 ENCOUNTER — Ambulatory Visit (HOSPITAL_COMMUNITY): Payer: PRIVATE HEALTH INSURANCE | Attending: Cardiology | Admitting: Cardiology

## 2014-02-15 ENCOUNTER — Other Ambulatory Visit (INDEPENDENT_AMBULATORY_CARE_PROVIDER_SITE_OTHER): Payer: PRIVATE HEALTH INSURANCE

## 2014-02-15 DIAGNOSIS — I70219 Atherosclerosis of native arteries of extremities with intermittent claudication, unspecified extremity: Secondary | ICD-10-CM | POA: Insufficient documentation

## 2014-02-15 DIAGNOSIS — Z79899 Other long term (current) drug therapy: Secondary | ICD-10-CM

## 2014-02-15 DIAGNOSIS — R0989 Other specified symptoms and signs involving the circulatory and respiratory systems: Secondary | ICD-10-CM

## 2014-02-15 DIAGNOSIS — I739 Peripheral vascular disease, unspecified: Secondary | ICD-10-CM

## 2014-02-15 LAB — BASIC METABOLIC PANEL
BUN: 30 mg/dL — ABNORMAL HIGH (ref 6–23)
CO2: 31 mEq/L (ref 19–32)
Calcium: 8.9 mg/dL (ref 8.4–10.5)
Chloride: 98 mEq/L (ref 96–112)
Creatinine, Ser: 0.9 mg/dL (ref 0.4–1.2)
GFR: 78.85 mL/min (ref >60.00)
Glucose, Bld: 142 mg/dL — ABNORMAL HIGH (ref 70–99)
Potassium: 3.8 mEq/L (ref 3.5–5.1)
Sodium: 138 mEq/L (ref 135–145)

## 2014-02-15 NOTE — Progress Notes (Signed)
Lower arterial doppler and duplex completed

## 2014-02-27 ENCOUNTER — Ambulatory Visit
Admission: RE | Admit: 2014-02-27 | Discharge: 2014-02-27 | Disposition: A | Discharge status: Home / Self Care | Payer: PRIVATE HEALTH INSURANCE | Source: Ambulatory Visit | Attending: Neurological Surgery | Admitting: Neurological Surgery

## 2014-02-27 DIAGNOSIS — M5000 Cervical disc disorder with myelopathy, unspecified cervical region: Secondary | ICD-10-CM

## 2014-03-01 ENCOUNTER — Other Ambulatory Visit: Payer: PRIVATE HEALTH INSURANCE

## 2014-03-01 ENCOUNTER — Ambulatory Visit (HOSPITAL_COMMUNITY): Payer: PRIVATE HEALTH INSURANCE | Attending: Cardiovascular Disease | Admitting: Radiology

## 2014-03-01 VITALS — BP 124/50 | Ht 64.0 in | Wt 222.0 lb

## 2014-03-01 DIAGNOSIS — R0989 Other specified symptoms and signs involving the circulatory and respiratory systems: Secondary | ICD-10-CM | POA: Insufficient documentation

## 2014-03-01 DIAGNOSIS — R079 Chest pain, unspecified: Secondary | ICD-10-CM | POA: Diagnosis present

## 2014-03-01 DIAGNOSIS — R0609 Other forms of dyspnea: Secondary | ICD-10-CM | POA: Insufficient documentation

## 2014-03-01 DIAGNOSIS — R0602 Shortness of breath: Secondary | ICD-10-CM | POA: Insufficient documentation

## 2014-03-01 DIAGNOSIS — R42 Dizziness and giddiness: Secondary | ICD-10-CM | POA: Diagnosis not present

## 2014-03-01 DIAGNOSIS — R002 Palpitations: Secondary | ICD-10-CM | POA: Diagnosis not present

## 2014-03-01 MED ORDER — TECHNETIUM TC 99M SESTAMIBI GENERIC - CARDIOLITE
30.0000 | Freq: Once | INTRAVENOUS | Status: AC | PRN
  Administered 2014-03-01: 30 via INTRAVENOUS

## 2014-03-01 MED ORDER — REGADENOSON 0.4 MG/5ML IV SOLN
0.4000 mg | Freq: Once | INTRAVENOUS | Status: AC
  Administered 2014-03-01: 0.4 mg via INTRAVENOUS

## 2014-03-01 NOTE — Progress Notes (Signed)
[facility] [address] [place], [place] [address] [phone]    Cardiology Nuclear Med Study  [name] [name] is a 68 y.o. female     MRN : [id number]     DOB: [birth year]  Procedure Date: 03/01/2014  Nuclear Med Background Indication for Stress Test:  Evaluation for Ischemia  History:  N/O CAD, 2005 Cath, 2012 ECHO: EF: 65-70% Cardiac Risk Factors: Hypertension, IDDM Type 1 and Lipids  Symptoms:  Chest Pain, Dizziness, DOE, Palpitations and SOB   Nuclear Pre-Procedure Caffeine/Decaff Intake:  None NPO After: 5 pm   Lungs:  clear O2 Sat: 95% on room air. IV 0.9% NS with Angio Cath:  22g  IV Site: L Antecubital  IV Started by:  Perrin Maltese, EMT-P  Chest Size (in):  46 Cup Size: E  Height: 5\' 4"  (1.626 m)  Weight:  222 lb (100.699 kg)  BMI:  Body mass index is 38.09 kg/(m^2). Tech Comments:  CBG 180 mg/dl this am    Nuclear Med Study 1 or 2 day study: 2 day  Stress Test Type:  Carlton Adam  Reading MD: n/a  Order Authorizing Provider:  K.Nelson MD  Resting Radionuclide: Technetium 42m Sestamibi  Resting Radionuclide Dose: 33.0 mCi  On      03-08-14  Stress Radionuclide:  Technetium 72m Sestamibi  Stress Radionuclide Dose: 33.0 mCi   On        03-01-14          Stress Protocol Rest HR: 66 Stress HR: 90  Rest BP: 124/50 Stress BP: 134/57  Exercise Time (min): n/a METS: n/a   Predicted Max HR: 152 bpm % Max HR: 59.21 bpm Rate Pressure Product: 12060   Dose of Adenosine (mg):  n/a Dose of Lexiscan: 0.4 mg  Dose of Atropine (mg): n/a Dose of Dobutamine: n/a mcg/kg/min (at max HR)  Stress Test Technologist: Perrin Maltese, EMT-P  Nuclear Technologist:  Charlton Amor, CNMT     Rest Procedure:  Myocardial perfusion imaging was performed at rest 45 minutes following the intravenous administration of Technetium 61m Sestamibi. Rest ECG: NSR - Normal EKG  Stress Procedure:  The patient received IV Lexiscan 0.4 mg over 15-seconds.   Technetium 34m Sestamibi injected at 30-seconds. This patient had sob and dizziness with the Lexiscan injection. Quantitative spect images were obtained after a 45 minute delay. Stress ECG: No significant change from baseline ECG  QPS Raw Data Images:  Mild breast attenuation.  Normal left ventricular size. Stress Images:  Normal homogeneous uptake in all areas of the myocardium. Rest Images:  Normal homogeneous uptake in all areas of the myocardium. Subtraction (SDS):  No evidence of ischemia. Transient Ischemic Dilatation (Normal <1.22):  1.00 Lung/Heart Ratio (Normal <0.45):  0.27  Quantitative Gated Spect Images QGS EDV:  58 ml QGS ESV:  14 ml  Impression Exercise Capacity:  Lexiscan with no exercise. BP Response:  Normal blood pressure response. Clinical Symptoms:  There is dyspnea. ECG Impression:  No significant ST segment change suggestive of ischemia. Comparison with Prior Nuclear Study: No images to compare  Overall Impression:  Normal stress nuclear study.  LV Ejection Fraction: 76%.  LV Wall Motion:  NL LV Function; NL Wall Motion  Signed: Fransico Him, MD [facility]

## 2014-03-08 ENCOUNTER — Ambulatory Visit (HOSPITAL_COMMUNITY): Payer: PRIVATE HEALTH INSURANCE | Attending: Cardiovascular Disease

## 2014-03-08 ENCOUNTER — Encounter: Payer: Self-pay | Admitting: Cardiology

## 2014-03-08 DIAGNOSIS — R0989 Other specified symptoms and signs involving the circulatory and respiratory systems: Secondary | ICD-10-CM

## 2014-03-08 MED ORDER — TECHNETIUM TC 99M SESTAMIBI GENERIC - CARDIOLITE
33.0000 | Freq: Once | INTRAVENOUS | Status: AC | PRN
  Administered 2014-03-08: 33 via INTRAVENOUS

## 2014-03-12 ENCOUNTER — Ambulatory Visit (INDEPENDENT_AMBULATORY_CARE_PROVIDER_SITE_OTHER): Payer: PRIVATE HEALTH INSURANCE | Admitting: Cardiology

## 2014-03-12 ENCOUNTER — Encounter: Payer: Self-pay | Admitting: Cardiology

## 2014-03-12 VITALS — BP 152/68 | HR 78 | Ht 64.0 in | Wt 222.0 lb

## 2014-03-12 DIAGNOSIS — Z79899 Other long term (current) drug therapy: Secondary | ICD-10-CM

## 2014-03-12 LAB — HEPATIC FUNCTION PANEL
ALT: 14 U/L (ref 0–35)
AST: 18 U/L (ref 0–37)
Albumin: 3.8 g/dL (ref 3.5–5.2)
Alkaline Phosphatase: 63 U/L (ref 39–117)
Bilirubin, Direct: 0 mg/dL (ref 0.0–0.3)
Total Bilirubin: 0.7 mg/dL (ref 0.2–1.2)
Total Protein: 7.4 g/dL (ref 6.0–8.3)

## 2014-03-12 LAB — BASIC METABOLIC PANEL
BUN: 27 mg/dL — ABNORMAL HIGH (ref 6–23)
CO2: 31 mEq/L (ref 19–32)
Calcium: 8.9 mg/dL (ref 8.4–10.5)
Chloride: 100 mEq/L (ref 96–112)
Creatinine, Ser: 1 mg/dL (ref 0.4–1.2)
GFR: 75.02 mL/min (ref >60.00)
Glucose, Bld: 188 mg/dL — ABNORMAL HIGH (ref 70–99)
Potassium: 4.1 mEq/L (ref 3.5–5.1)
Sodium: 141 mEq/L (ref 135–145)

## 2014-03-12 NOTE — Progress Notes (Signed)
Patient ID: YOVANNA COGAN, female   DOB: 1946/09/27, 68 y.o.   MRN: [id number]    Patient Name: [name] [name] Date of Encounter: 03/12/2014  Primary Care Provider:  Larey Dresser, MD Primary Cardiologist:  Dorothy Spark  Problem List   Past Medical History  Diagnosis Date  . CAD 08/26/2006    Cath 07/05:multiple areas of nonobstructive disease = medical & RF mgmt, well preserved global systolic function. Dr. Lia Foyer      . DIABETES MELLITUS, TYPE II 08/26/2006    Insulin dependent    . HYPERTENSION 08/26/2006    On BB, ACEI, lasix, and imdur.     . Chronic pain syndrome 01/03/2013    Multifactorial. Non opioid requiring.   L2-5 fusion, with hardware at L2-3, stable per MRI 2010. Followed by neurosurgery (Dr. 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    [name] DEPRESSION 08/26/2006    On paxil    . DYSLIPIDEMIA 11/01/2006    LDL goal < 100, 70 if can achieve without side effects.     . OBESITY 08/26/2006    BMI 39.5. Obesity Class 2.     . GERD 08/26/2006    Heartburn QHS.      Past Surgical History  Procedure Laterality Date  . Posterior lumbar interfusion surgery  07/18/07    L2-L3  . Lumbar fusion surgery  10/08    L3-L5  . Rotator cuff surgery    . Cholecystectomy    . Endometrial biospy     Allergies  No Known Allergies  HPI  This is a very nice patient who used to follow with Dr Lia Foyer for non-obstructive CAD, HTN and hyperlipidemia. Her cath in 2005 showed multiple areas of nonobstructive disease = medical & RF mgmt, well preserved global systolic function. She has been doing well until recently when she developed DOE and pronounced fatigue. She has been very complait with her medications. She is minimally active as her prior knee surgery is limiting her, however able to do all the house chores. Moderate activity would give significant SOB that is progressively worsening. She is concerned about LE  edema that has been present for the last two months. She has been taking lasix 40 mg po daily chronically. She is also experiencing claudications on short distances. No palpitations or syncope,  PND or orthopnea.   The patient underwent stress testing that was negative for scar or ischemia, normal left ventricular function and normal blood pressure. The patient states that her lower extremity edema has been significantly improved and so is her shortness of breath. She is compliant with her medications and is trying to walk but is limited because of her orthopedic issues. She is complaining of numbness in her left hand that started a few weeks ago.  Home Medications  Prior to Admission medications   Medication Sig Start Date End Date Taking? Authorizing Provider  amLODipine (NORVASC) 5 MG tablet Take 1 tablet (5 mg total) by mouth daily. 04/25/13 04/25/14 Yes Bartholomew Crews, MD  aspirin 81 MG tablet Take 1 tablet (81 mg total) by mouth daily. 04/25/13  Yes Bartholomew Crews, MD  atenolol (TENORMIN) 100 MG tablet Take 0.5 tablets (50 mg total) by mouth daily. 04/25/13  Yes Bartholomew Crews, MD  atorvastatin (LIPITOR) 20 MG tablet take 1 tablet by mouth once daily 01/11/14  Yes Bartholomew Crews, MD  benazepril (LOTENSIN) 40 MG tablet take 1 tablet by mouth  once daily 04/25/13  Yes Bartholomew Crews, MD  calcium carbonate (OS-CAL) 600 MG TABS Take 1 tablet (600 mg total) by mouth 2 (two) times daily with a meal. 04/25/13  Yes Bartholomew Crews, MD  cetaphil (CETAPHIL) cream Apply 1 application topically 2 (two) times daily as needed (pain).   Yes Historical Provider, MD  cholecalciferol (VITAMIN D) 1000 UNITS tablet Take 1 tablet (1,000 Units total) by mouth daily. 04/26/13  Yes Bartholomew Crews, MD  Cyanocobalamin 1000 MCG CAPS Take 1,000 mcg by mouth daily. 04/25/13  Yes Bartholomew Crews, MD  diclofenac sodium (VOLTAREN) 1 % GEL Apply 4 g topically 4 (four) times daily. Apply small amount  to affected area up to 4 times a day. 04/25/13  Yes Bartholomew Crews, MD  ferrous sulfate 325 (65 FE) MG tablet Take 1 tablet (325 mg total) by mouth 2 (two) times daily with a meal. 04/25/13  Yes Bartholomew Crews, MD  furosemide (LASIX) 40 MG tablet Take 1 tablet (40 mg total) by mouth daily. 04/25/13  Yes Bartholomew Crews, MD  gabapentin (NEURONTIN) 400 MG capsule take 1 capsule by mouth three times a day 04/25/13  Yes Bartholomew Crews, MD  Insulin Glargine (LANTUS SOLOSTAR) 100 UNIT/ML SOPN Inject 20 Units into the skin at bedtime. 04/25/13  Yes Bartholomew Crews, MD  isosorbide mononitrate (IMDUR) 30 MG 24 hr tablet Take 1 tablet (30 mg total) by mouth daily. 04/25/13  Yes Bartholomew Crews, MD  loratadine (CLARITIN) 10 MG tablet Take 10 mg by mouth daily as needed for allergies.   Yes Historical Provider, MD  Magnesium Chloride (MAG-DELAY) 535 (64 MG) MG TBCR Take 1 tablet (64 mg total) by mouth 2 (two) times daily. 12/25/13  Yes Bartholomew Crews, MD  metFORMIN (GLUCOPHAGE) 1000 MG tablet Take 1 tablet (1,000 mg total) by mouth 2 (two) times daily with a meal. 04/25/13  Yes Bartholomew Crews, MD  nitroGLYCERIN (NITROSTAT) 0.4 MG SL tablet Place 1 tablet (0.4 mg total) under the tongue every 5 (five) minutes as needed for chest pain. 08/30/13  Yes Burnell Blanks, MD  PARoxetine (PAXIL) 40 MG tablet Take 1 tablet (40 mg total) by mouth every morning. 04/25/13  Yes Bartholomew Crews, MD  ranitidine (ZANTAC) 150 MG tablet Take 1 tablet (150 mg total) by mouth 2 (two) times daily. Try once a day in the evening. If not effective, increase to one pill twice a day 04/25/13 04/25/14 Yes Bartholomew Crews, MD  traMADol (ULTRAM) 50 MG tablet Take 1 tablet (50 mg total) by mouth 2 (two) times daily as needed. 01/01/14  Yes Carlos Levering Draper, DO  zolpidem (AMBIEN CR) 6.25 MG CR tablet Take 1 tablet (6.25 mg total) by mouth at bedtime as needed for sleep. 04/25/13 12/21/13  Bartholomew Crews,  MD    Family History  Family History  Problem Relation Age of Onset  . Hypertension Sister   . Hodgkin's lymphoma Sister     Social History  History   Social History  . Marital Status: Single    Spouse Name: N/A    Number of Children: N/A  . Years of Education: N/A   Occupational History  . Not on file.   Social History Main Topics  . Smoking status: Never Smoker   . Smokeless tobacco: Never Used  . Alcohol Use: No  . Drug Use: No  . Sexual Activity: Not on file   Other Topics Concern  .  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      Review of Systems, as per HPI, otherwise negative General:  No chills, fever, night sweats or weight changes.  Cardiovascular:  No chest pain, dyspnea on exertion, edema, orthopnea, palpitations, paroxysmal nocturnal dyspnea. Dermatological: No rash, lesions/masses Respiratory: No cough, dyspnea Urologic: No hematuria, dysuria Abdominal:   No nausea, vomiting, diarrhea, bright red blood per rectum, melena, or hematemesis Neurologic:  No visual changes, wkns, changes in mental status. All other systems reviewed and are otherwise negative except as noted above.  Physical Exam  Blood pressure 152/68, pulse 78, height 5\' 4"  (1.626 m), weight 222 lb (100.699 kg).  General: Pleasant, NAD Psych: Normal affect. Neuro: Alert and oriented X 3. Moves all extremities spontaneously. HEENT: Normal  Neck: Supple without bruits or JVD. Lungs:  Resp regular and unlabored, CTA. Heart: RRR no s3, s4, or murmurs. Abdomen: Soft, non-tender, non-distended, BS + x 4.  Extremities: No clubbing, cyanosis,  B/L LE edema up to the knees, week DP/PT pulses . Radials 2+ and equal bilaterally.  Labs:  No results found for this basename: CKTOTAL, CKMB, TROPONINI,  in the last 72 hours Lab Results  Component Value Date   WBC 6.9 12/21/2013   HGB 13.3 12/21/2013   HCT 38.1 12/21/2013   MCV 83.7 12/21/2013   PLT  303 12/21/2013    No results found for this basename: DDIMER   No components found with this basename: POCBNP,     Component Value Date/Time   NA 138 02/15/2014 1053   K 3.8 02/15/2014 1053   CL 98 02/15/2014 1053   CO2 31 02/15/2014 1053   GLUCOSE 142* 02/15/2014 1053   BUN 30* 02/15/2014 1053   CREATININE 0.9 02/15/2014 1053   CREATININE 0.68 04/25/2013 1104   CALCIUM 8.9 02/15/2014 1053   PROT 7.7 12/21/2013 1801   ALBUMIN 3.8 12/21/2013 1801   AST 15 12/21/2013 1801   ALT 14 12/21/2013 1801   ALKPHOS 87 12/21/2013 1801   BILITOT 0.7 12/21/2013 1801   GFRNONAA 88* 12/22/2013 0442   GFRNONAA >89 04/25/2013 1104   GFRAA >90 12/22/2013 0442   GFRAA >89 04/25/2013 1104   Lab Results  Component Value Date   CHOL 124 12/19/2013   HDL 55 12/19/2013   LDLCALC 59 12/19/2013   TRIG 48 12/19/2013    Accessory Clinical Findings  Echocardiogram - 2012 Left ventricle: The cavity size was normal. Wall thickness was normal. Systolic function was vigorous. The estimated ejection fraction was in the range of 65% to 70%. Wall motion was normal; there were no regional wall motion abnormalities. Doppler parameters are consistent with abnormal left ventricular relaxation (grade 1 diastolic dysfunction). - Aortic valve: Trivial regurgitation. - Atrial septum: No defect or patent foramen ovale was identified.  ECG - SR, normal ECG    Assessment & Plan  A very pleasant 68 year old female   1. Acute on chronic diastolic CHF - DOE, LE edema - Echo in 2012 showed good LVEF 65-70% and impaired relaxation. We added metolazone 2.5 mg po daily and KCl 10 mEq daily with improvement, we'll check BMP today.   2. CAD - h/o non-obstructive CAD by cath in 2005, normal stress test in May 2015.  3. Claudications - LE arterial Duplex - Possible mild inflow disease on the left. 0-49% bilateral SFA disease, without focal stenosis. Three vesel run-off, bilaterally. We will continue medical management.  4. Hyperlipidemia -  continue statin  Follow up in 3 months  Dorothy Spark, MD, [facility] 03/12/2014, 8:25 AM

## 2014-03-12 NOTE — Patient Instructions (Addendum)
Will obtain labs today and call you with the results (bmet/hfp)  Your physician recommends that you continue on your current medications as directed. Please refer to the Current Medication list given to you today.  Your physician recommends that you schedule a follow-up appointment in: 3 month ov  Will arrange an appointment with Dr Daylene Katayama [phone]) and call you with date and time

## 2014-03-14 LAB — HM DIABETES EYE EXAM

## 2014-03-27 ENCOUNTER — Encounter: Payer: Self-pay | Admitting: Internal Medicine

## 2014-03-27 ENCOUNTER — Ambulatory Visit (INDEPENDENT_AMBULATORY_CARE_PROVIDER_SITE_OTHER): Payer: PRIVATE HEALTH INSURANCE | Admitting: Internal Medicine

## 2014-03-27 ENCOUNTER — Telehealth: Payer: Self-pay | Admitting: Dietician

## 2014-03-27 VITALS — BP 129/71 | HR 81 | Temp 98.7°F | Ht 62.0 in | Wt 221.0 lb

## 2014-03-27 DIAGNOSIS — I739 Peripheral vascular disease, unspecified: Secondary | ICD-10-CM | POA: Insufficient documentation

## 2014-03-27 DIAGNOSIS — I798 Other disorders of arteries, arterioles and capillaries in diseases classified elsewhere: Secondary | ICD-10-CM

## 2014-03-27 DIAGNOSIS — I509 Heart failure, unspecified: Secondary | ICD-10-CM

## 2014-03-27 DIAGNOSIS — F3289 Other specified depressive episodes: Secondary | ICD-10-CM

## 2014-03-27 DIAGNOSIS — E1159 Type 2 diabetes mellitus with other circulatory complications: Secondary | ICD-10-CM

## 2014-03-27 DIAGNOSIS — E1165 Type 2 diabetes mellitus with hyperglycemia: Secondary | ICD-10-CM

## 2014-03-27 DIAGNOSIS — I10 Essential (primary) hypertension: Secondary | ICD-10-CM

## 2014-03-27 DIAGNOSIS — G894 Chronic pain syndrome: Secondary | ICD-10-CM

## 2014-03-27 DIAGNOSIS — IMO0002 Reserved for concepts with insufficient information to code with codable children: Secondary | ICD-10-CM

## 2014-03-27 DIAGNOSIS — I2510 Atherosclerotic heart disease of native coronary artery without angina pectoris: Secondary | ICD-10-CM

## 2014-03-27 DIAGNOSIS — G8929 Other chronic pain: Secondary | ICD-10-CM

## 2014-03-27 DIAGNOSIS — F329 Major depressive disorder, single episode, unspecified: Secondary | ICD-10-CM

## 2014-03-27 DIAGNOSIS — G4700 Insomnia, unspecified: Secondary | ICD-10-CM

## 2014-03-27 DIAGNOSIS — M19049 Primary osteoarthritis, unspecified hand: Secondary | ICD-10-CM

## 2014-03-27 DIAGNOSIS — I5032 Chronic diastolic (congestive) heart failure: Secondary | ICD-10-CM

## 2014-03-27 DIAGNOSIS — E1151 Type 2 diabetes mellitus with diabetic peripheral angiopathy without gangrene: Secondary | ICD-10-CM

## 2014-03-27 DIAGNOSIS — E669 Obesity, unspecified: Secondary | ICD-10-CM

## 2014-03-27 HISTORY — DX: Peripheral vascular disease, unspecified: I73.9

## 2014-03-27 LAB — GLUCOSE, CAPILLARY: Glucose-Capillary: 148 mg/dL — ABNORMAL HIGH (ref 70–99)

## 2014-03-27 LAB — POCT GLYCOSYLATED HEMOGLOBIN (HGB A1C): Hemoglobin A1C: 9.2

## 2014-03-27 MED ORDER — LIRAGLUTIDE 18 MG/3ML ~~LOC~~ SOPN: PEN_INJECTOR | SUBCUTANEOUS | Status: DC

## 2014-03-27 NOTE — Assessment & Plan Note (Signed)
She has lost 11 lbs in 3 months! [facility] to [facility]. Congratulated her and encouraged her to cont.

## 2014-03-27 NOTE — Patient Instructions (Signed)
General Instructions: 1. You are doing a great job exercising and losing weight!!!! Keep up the good work. 2. See me in 3 months to recheck your A1C 3. Please see the hand doctor - Dr Burney Gauze 4. Start the new diabetes medicine. Call me if too expensive.    Treatment Goals:  Goals (1 Years of Data) as of 03/27/14         As of Today 03/12/14 03/01/14 02/06/14 01/01/14     Blood Pressure    . Blood Pressure < 140/90  129/71 152/68 124/50 120/66 137/71     Result Component    . HEMOGLOBIN A1C < 7.0  9.2        . LDL CALC < 100            Progress Toward Treatment Goals:  Treatment Goal 03/27/2014  Hemoglobin A1C deteriorated  Blood pressure at goal    Self Care Goals & Plans:  Self Care Goal 07/25/2013  Manage my medications take my medicines as prescribed; refill my medications on time  Monitor my health check my feet daily  Eat healthy foods eat baked foods instead of fried foods; eat foods that are low in salt  Be physically active take a walk every day    Home Blood Glucose Monitoring 03/27/2014  Check my blood sugar 3 times a day  When to check my blood sugar before meals     Care Management & Community Referrals:  Referral 03/27/2014  Referrals made for care management support none needed  Referrals made to community resources -

## 2014-03-27 NOTE — Assessment & Plan Note (Signed)
Cards added metolazone to her lasix. She is tolerating it well.

## 2014-03-27 NOTE — Assessment & Plan Note (Signed)
Great control on Norvasc 5, atenolol 100, benazepril 40, lasix 40, and imdur 30. Metolazone was added for volume control and tolerating it well.

## 2014-03-27 NOTE — Assessment & Plan Note (Signed)
On ASA, BB, statin. Sees cards. Stress test 01/2014 : Normal stress nuclear study. LV Ejection Fraction: 76%. LV Wall Motion: NL LV Function; NL Wall Motion

## 2014-03-27 NOTE — Progress Notes (Signed)
   Subjective:    Patient ID: [name] [name], female    DOB: 10-26-1945, 68 y.o.   MRN: [id number]  HPI  Please see the A&P for the status of the pt's chronic medical problems.   Review of Systems  Constitutional:       Intentional weight loss  Respiratory:       DOE  Cardiovascular: Negative for chest pain.  Musculoskeletal:       L hand numbness with shooting pain up arm  Psychiatric/Behavioral: Negative for dysphoric mood.       Objective:   Physical Exam  Constitutional: She is oriented to person, place, and time. She appears well-developed and well-nourished. No distress.  HENT:  Head: Normocephalic and atraumatic.  Right Ear: External ear normal.  Left Ear: External ear normal.  Nose: Nose normal.  Eyes: Conjunctivae and EOM are normal.  Neck: Normal range of motion.  Cardiovascular: Normal rate, regular rhythm and normal heart sounds.   Pulmonary/Chest: Effort normal and breath sounds normal.  Musculoskeletal: Normal range of motion. She exhibits no edema.  Neurological: She is alert and oriented to person, place, and time.  Skin: Skin is warm and dry. She is not diaphoretic.  Psychiatric: She has a normal mood and affect. Her behavior is normal. Judgment and thought content normal.          Assessment & Plan:

## 2014-03-27 NOTE — Telephone Encounter (Signed)
Asked patient to schedule an appointment once she picked up the Victoza from the pharmacy

## 2014-03-27 NOTE — Assessment & Plan Note (Signed)
Well controlled on her Paxil.

## 2014-03-27 NOTE — Assessment & Plan Note (Addendum)
A1C trended up to 9.2 from 8.8. She is on metformin 1000 BID and lantus 20. Her lantus dose is max per weight calc. Her CBG's are all over the place from 94 to > 200. She is eating DM diet per pt. She is exercising 1-2 x weekly and losing weight! She is agreeable to trying Victoza and return in 3 months for repeat A1C. If cannot tolerate / afford Victoza, will add pre-prandial Novolog.   Lab Results  Component Value Date   HGBA1C 9.2 03/27/2014   HGBA1C 8.8 12/19/2013   HGBA1C 7.7 07/25/2013     Assessment: Diabetes control: poor control (HgbA1C >9%) Progress toward A1C goal:  deteriorated Comments: See above  Plan: Medications:  See above Home glucose monitoring: Frequency: 3 times a day Timing: before meals Instruction/counseling given: other instruction/counseling: See above Educational resources provided:   Self management tools provided:   Other plans: See above.

## 2014-03-27 NOTE — Assessment & Plan Note (Signed)
Her hands are still bothering her. It is from OA. She does not remember seeing Dr Burney Gauze but I have his notes. She was told to try PT and splints and return in 6 weeks. She requested assistance to make F/U appt.

## 2014-03-27 NOTE — Assessment & Plan Note (Signed)
Uses Ambien prn.

## 2014-04-04 NOTE — Telephone Encounter (Signed)
Noted thanks °

## 2014-04-04 NOTE — Telephone Encounter (Signed)
Called patient to follow up since no appointment scheduled yet. She has not picked up the Victoza. Plans to pick it up tomorrow and preferred to call back to schedule and appointment with CDE to learn how to use it.

## 2014-04-09 ENCOUNTER — Encounter: Payer: Self-pay | Admitting: Dietician

## 2014-04-09 ENCOUNTER — Ambulatory Visit (INDEPENDENT_AMBULATORY_CARE_PROVIDER_SITE_OTHER): Payer: PRIVATE HEALTH INSURANCE | Admitting: Dietician

## 2014-04-09 DIAGNOSIS — E1159 Type 2 diabetes mellitus with other circulatory complications: Secondary | ICD-10-CM

## 2014-04-09 DIAGNOSIS — IMO0002 Reserved for concepts with insufficient information to code with codable children: Secondary | ICD-10-CM

## 2014-04-09 DIAGNOSIS — E1151 Type 2 diabetes mellitus with diabetic peripheral angiopathy without gangrene: Secondary | ICD-10-CM

## 2014-04-09 DIAGNOSIS — I798 Other disorders of arteries, arterioles and capillaries in diseases classified elsewhere: Secondary | ICD-10-CM

## 2014-04-09 DIAGNOSIS — E1165 Type 2 diabetes mellitus with hyperglycemia: Secondary | ICD-10-CM

## 2014-04-09 NOTE — Patient Instructions (Signed)
Inject Victoza one time a day.  Inject the Victoza in your stomach.  Inject  0.6 mg until next Sunday which is July 5th.  On July 6th start 1.2mg  one time a day.

## 2014-04-09 NOTE — Progress Notes (Signed)
Diabetes Self-Management Education  Visit Number: Other (Comment)  04/09/2014 Ms. Dorena Dew, identified by name and date of birth, is a 68 y.o. female with Type 2 diabetes  .        ASSESSMENT  Patient Concerns:     Vital snot done today  Lab Results: LDL Cholesterol  Date Value Ref Range Status  12/19/2013 59  0 - 99 mg/dL Final           Total Cholesterol/HDL Ratio:CHD Risk                            Coronary Heart Disease Risk Table                                            Men       Women              1/2 Average Risk              3.4        3.3                  Average Risk              5.0        4.4               2X Average Risk              9.6        7.1               3X Average Risk             23.4       11.0     Use the calculated Patient Ratio above and the CHD Risk table      to determine the patient's CHD Risk.     ATP III Classification (LDL):           < 100        mg/dL         Optimal          100 - 129     mg/dL         Near or Above Optimal          130 - 159     mg/dL         Borderline High          160 - 189     mg/dL         High           > 190        mg/dL         Very High           Hemoglobin A1C  Date Value Ref Range Status  03/27/2014 9.2   Final  12/22/2011 7.1* <5.7 % Final  According to the ADA Clinical Practice Recommendations for 2011, when     HbA1c is used as a screening test:             >=6.5%   Diagnostic of Diabetes Mellitus                (if abnormal result is confirmed)           5.7-6.4%   Increased risk of developing Diabetes Mellitus           References:Diagnosis and Classification of Diabetes Mellitus,Diabetes     WSFK,8127,51(ZGYFV 1):S62-S69 and Standards of Medical Care in             Diabetes - 2011,Diabetes CBSW,9675,91 (Suppl 1):S11-S61.     Family History  Problem Relation Age of Onset  . Hypertension Sister   . Hodgkin's  lymphoma Sister    History  Substance Use Topics  . Smoking status: Never Smoker   . Smokeless tobacco: Never Used  . Alcohol Use: No    Support Systems:   family Special Needs:    simple instructions Prior DM Education:  yes Daily Foot Exams:   need to assess at future visit Patient Belief / Attitude about Diabetes:   It can be controlled  Assessment comments: patient was educaated on action, purpose, side effects and storage of Victoza. CDE instructed patient on priming new pens one time one and observed patient prime pen and give herself an injection of 0.6 mg in her left back of her arm subcutaneous fat with out difficulty. She also corectly removed the open needle, recapped the pen with no prompting and was abel to teach back storage and injection schedule and when to call   Diet Recall: need to assess at future visit   Individualized Plan for Diabetes Self-Management Training:  Medication instruction/education only today per patient.  Education Topics Reviewed with Patient Today:  Topic Points Discussed  Disease State    Nutrition Management    Physical Activity and Exercise    Medications Reviewed patients medication for diabetes, action, purpose, timing of dose and side effects.  Monitoring    Acute Complications    Chronic Complications    Psychosocial Adjustment    Goal Setting    Preconception Care (if applicable)      PATIENTS GOALS   Learning Objective(s):     Goal The patient agrees to:  Nutrition    Physical Activity    Medications  take medicine as instructed  Monitoring    Problem Solving    Reducing Risk    Health Coping ask for help with nausea, vomiting low blood sugars with new medicine   Patient Self-Evaluation of Goals (Subsequent Visits)  Goal The patient rates self as meeting goals (% of time)  Nutrition    Physical Activity    Medications    Monitoring    Problem Solving    Reducing Risk    Health Coping       PERSONALIZED  PLAN / SUPPORT  Self-Management Support:  [place] office;Family;CDE visits ______________________________________________________________________  Outcomes  Expected Outcomes:     Self-Care Barriers:  Low literacy;Lack of transportation;Lack of material resources Education material provided: yes If problems or questions, patient to contact team via:  Phone Time in: 0930     Time out: 0945 Future DSME appointment: - 4-6 wks;PRN   Doron Shake, Butch Penny 04/09/2014 10:11 AM

## 2014-04-16 ENCOUNTER — Telehealth: Payer: Self-pay | Admitting: Dietician

## 2014-04-16 NOTE — Telephone Encounter (Signed)
Patient called saying she felt fine yesterday  And then today she increased her Victoza to 1.2 mcg and she is feeling very nauseated and vomited one time. Blood sugar today was 126 mg/dl. Advised he to decrease her Victoza back to the 0.6 mg/day tomorrow and for the remainder of the week and retry increasing to 1.2 mg/day if she feels okay this week. Also suggested she carry saltines with her to eat when feeling nauseated.

## 2014-04-22 ENCOUNTER — Other Ambulatory Visit: Payer: Self-pay | Admitting: Internal Medicine

## 2014-05-09 ENCOUNTER — Other Ambulatory Visit: Payer: Self-pay | Admitting: Internal Medicine

## 2014-05-09 NOTE — Telephone Encounter (Signed)
Reviewed most recent CBC, ferritin, and Vit D level. Has Oct appt.

## 2014-05-14 ENCOUNTER — Other Ambulatory Visit: Payer: Self-pay | Admitting: Internal Medicine

## 2014-05-14 DIAGNOSIS — Z1231 Encounter for screening mammogram for malignant neoplasm of breast: Secondary | ICD-10-CM

## 2014-05-18 ENCOUNTER — Telehealth: Payer: Self-pay | Admitting: Dietician

## 2014-05-18 NOTE — Telephone Encounter (Signed)
Called to follow up on Victoza, side effects and blood sugars. Patient says she is still using 0.6 of Victoza once daily, she feels fine. Her blood sugars are as follows: 133,102,108,158,175,91,149,101,120,134. Patient request visit with CDE in October when she comes to see Dr. Lynnae January. Will ask front office to schedule.

## 2014-05-18 NOTE — Telephone Encounter (Signed)
Thank you :)

## 2014-06-02 ENCOUNTER — Other Ambulatory Visit: Payer: Self-pay | Admitting: Internal Medicine

## 2014-06-11 ENCOUNTER — Other Ambulatory Visit: Payer: Self-pay | Admitting: *Deleted

## 2014-06-12 ENCOUNTER — Encounter: Payer: Self-pay | Admitting: Cardiology

## 2014-06-12 ENCOUNTER — Telehealth: Payer: Self-pay | Admitting: *Deleted

## 2014-06-12 ENCOUNTER — Ambulatory Visit (INDEPENDENT_AMBULATORY_CARE_PROVIDER_SITE_OTHER): Payer: PRIVATE HEALTH INSURANCE | Admitting: Cardiology

## 2014-06-12 VITALS — BP 122/56 | HR 71 | Ht 62.0 in | Wt 213.0 lb

## 2014-06-12 DIAGNOSIS — E1165 Type 2 diabetes mellitus with hyperglycemia: Secondary | ICD-10-CM

## 2014-06-12 DIAGNOSIS — I509 Heart failure, unspecified: Secondary | ICD-10-CM

## 2014-06-12 DIAGNOSIS — I5032 Chronic diastolic (congestive) heart failure: Secondary | ICD-10-CM

## 2014-06-12 DIAGNOSIS — E1151 Type 2 diabetes mellitus with diabetic peripheral angiopathy without gangrene: Secondary | ICD-10-CM

## 2014-06-12 DIAGNOSIS — I739 Peripheral vascular disease, unspecified: Secondary | ICD-10-CM

## 2014-06-12 DIAGNOSIS — I10 Essential (primary) hypertension: Secondary | ICD-10-CM

## 2014-06-12 DIAGNOSIS — IMO0002 Reserved for concepts with insufficient information to code with codable children: Secondary | ICD-10-CM

## 2014-06-12 DIAGNOSIS — E785 Hyperlipidemia, unspecified: Secondary | ICD-10-CM

## 2014-06-12 DIAGNOSIS — I2510 Atherosclerotic heart disease of native coronary artery without angina pectoris: Secondary | ICD-10-CM

## 2014-06-12 LAB — COMPREHENSIVE METABOLIC PANEL
ALT: 15 U/L (ref 0–35)
AST: 20 U/L (ref 0–37)
Albumin: 3.8 g/dL (ref 3.5–5.2)
Alkaline Phosphatase: 69 U/L (ref 39–117)
BUN: 32 mg/dL — ABNORMAL HIGH (ref 6–23)
CO2: 28 mEq/L (ref 19–32)
Calcium: 8.9 mg/dL (ref 8.4–10.5)
Chloride: 102 mEq/L (ref 96–112)
Creatinine, Ser: 1.4 mg/dL — ABNORMAL HIGH (ref 0.4–1.2)
GFR: 47.14 mL/min — ABNORMAL LOW (ref >60.00)
Glucose, Bld: 108 mg/dL — ABNORMAL HIGH (ref 70–99)
Potassium: 4.4 mEq/L (ref 3.5–5.1)
Sodium: 139 mEq/L (ref 135–145)
Total Bilirubin: 0.8 mg/dL (ref 0.2–1.2)
Total Protein: 7.8 g/dL (ref 6.0–8.3)

## 2014-06-12 NOTE — Telephone Encounter (Signed)
Message copied by Nuala Alpha on Tue Jun 12, 2014  5:56 PM ------      Message from: Dorothy Spark      Created: Tue Jun 12, 2014  4:55 PM       Please ask her to hold metolazone and prescribe her compression stockings, thank you ------

## 2014-06-12 NOTE — Telephone Encounter (Signed)
Informed pt of lab results and needing to hold her metolazone per Dr Meda Coffee.  Also informed pt that Dr Meda Coffee has ordered for her to wear compression stockings daily.  Informed pt that I will mail the script for compression stockings to her place of residence.  Pt verbalized understanding and agrees with this plan.

## 2014-06-12 NOTE — Patient Instructions (Signed)
Your physician recommends that you continue on your current medications as directed. Please refer to the Current Medication list given to you today.   Your physician recommends that you return for lab work in: TODAY (CMET)  Your physician wants you to follow-up in: 6 MONTHS WITH DR NELSON You will receive a reminder letter in the mail two months in advance. If you don't receive a letter, please call our office to schedule the follow-up appointment.   

## 2014-06-12 NOTE — Progress Notes (Signed)
Patient ID: [name] [name], female   DOB: [birth year], 68 y.o.   MRN: [id number]    Patient Name: [name] [name] Date of Encounter: 06/12/2014  Primary Care Provider:  Larey Dresser, MD Primary Cardiologist:  Dorothy Spark  Problem List   Past Medical History  Diagnosis Date  . DIABETES MELLITUS, TYPE II 08/26/2006    Insulin dependent. Victoza added 03/2014  . Chronic pain syndrome 01/03/2013    Multifactorial. Non opioid requiring.   L2-5 fusion, with hardware at L2-3, stable per MRI 2010. Followed by neurosurgery (Dr. 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    [name] DEPRESSION 08/26/2006    On paxil    . DYSLIPIDEMIA 11/01/2006    LDL goal < 100, 70 if can achieve without side effects.     . OBESITY 08/26/2006    BMI 39.5. Obesity Class 2.     . GERD 08/26/2006    Heartburn QHS.     [name] PVD (peripheral vascular disease) 03/27/2014    2015 LE arterial Duplex - Possible mild inflow disease on the left. 0-49% bilateral SFA disease, without focal stenosis. Three vesel run-off, bilaterally. Medical tx.   [name] CAD 08/26/2006    Cath 07/05:multiple areas of nonobstructive disease = medical & RF mgmt, well preserved global systolic function. Dr. Lia Foyer. Nuclear med stress test 01/2014 : Normal stress nuclear study. LV Ejection Fraction: 76%. LV Wall Motion: NL LV Function; NL Wall Motion.      [name] HYPERTENSION 08/26/2006    On BB, ACEI, lasix, norvasc, metolazone, and imdur.      Past Surgical History  Procedure Laterality Date  . Posterior lumbar interfusion surgery  07/18/07    L2-L3  . Lumbar fusion surgery  10/08    L3-L5  . Rotator cuff surgery    . Cholecystectomy    . Endometrial biospy     Allergies  No Known Allergies  HPI  This is a very nice patient who used to follow with Dr Lia Foyer for non-obstructive CAD, HTN and hyperlipidemia. Her cath in 2005 showed multiple areas of nonobstructive disease = medical &  RF mgmt, well preserved global systolic function. She has been doing well until recently when she developed DOE and pronounced fatigue. She has been very complait with her medications. She is minimally active as her prior knee surgery is limiting her, however able to do all the house chores. Moderate activity would give significant SOB that is progressively worsening. She is concerned about LE edema that has been present for the last two months. She has been taking lasix 40 mg po daily chronically. She is also experiencing claudications on short distances. No palpitations or syncope,  PND or orthopnea.   The patient underwent stress testing that was negative for scar or ischemia, normal left ventricular function and normal blood pressure. The patient states that her lower extremity edema has been significantly improved and so is her shortness of breath. She is compliant with her medications and is trying to walk but is limited because of her orthopedic issues. She is complaining of numbness in her left hand that started a few weeks ago.  06/12/2014 - 3 months follow up, improved LE edema with metolazone, no orthopnea, PND, chest pain or DOE. She is complaint with meds.  Home Medications  Prior to Admission medications   Medication Sig Start Date End Date Taking? Authorizing Provider  amLODipine (NORVASC) 5 MG tablet Take 1 tablet (5  mg total) by mouth daily. 04/25/13 04/25/14 Yes Bartholomew Crews, MD  aspirin 81 MG tablet Take 1 tablet (81 mg total) by mouth daily. 04/25/13  Yes Bartholomew Crews, MD  atenolol (TENORMIN) 100 MG tablet Take 0.5 tablets (50 mg total) by mouth daily. 04/25/13  Yes Bartholomew Crews, MD  atorvastatin (LIPITOR) 20 MG tablet take 1 tablet by mouth once daily 01/11/14  Yes Bartholomew Crews, MD  benazepril (LOTENSIN) 40 MG tablet take 1 tablet by mouth once daily 04/25/13  Yes Bartholomew Crews, MD  calcium carbonate (OS-CAL) 600 MG TABS Take 1 tablet (600 mg total) by  mouth 2 (two) times daily with a meal. 04/25/13  Yes Bartholomew Crews, MD  cetaphil (CETAPHIL) cream Apply 1 application topically 2 (two) times daily as needed (pain).   Yes Historical Provider, MD  cholecalciferol (VITAMIN D) 1000 UNITS tablet Take 1 tablet (1,000 Units total) by mouth daily. 04/26/13  Yes Bartholomew Crews, MD  Cyanocobalamin 1000 MCG CAPS Take 1,000 mcg by mouth daily. 04/25/13  Yes Bartholomew Crews, MD  diclofenac sodium (VOLTAREN) 1 % GEL Apply 4 g topically 4 (four) times daily. Apply small amount to affected area up to 4 times a day. 04/25/13  Yes Bartholomew Crews, MD  ferrous sulfate 325 (65 FE) MG tablet Take 1 tablet (325 mg total) by mouth 2 (two) times daily with a meal. 04/25/13  Yes Bartholomew Crews, MD  furosemide (LASIX) 40 MG tablet Take 1 tablet (40 mg total) by mouth daily. 04/25/13  Yes Bartholomew Crews, MD  gabapentin (NEURONTIN) 400 MG capsule take 1 capsule by mouth three times a day 04/25/13  Yes Bartholomew Crews, MD  Insulin Glargine (LANTUS SOLOSTAR) 100 UNIT/ML SOPN Inject 20 Units into the skin at bedtime. 04/25/13  Yes Bartholomew Crews, MD  isosorbide mononitrate (IMDUR) 30 MG 24 hr tablet Take 1 tablet (30 mg total) by mouth daily. 04/25/13  Yes Bartholomew Crews, MD  loratadine (CLARITIN) 10 MG tablet Take 10 mg by mouth daily as needed for allergies.   Yes Historical Provider, MD  Magnesium Chloride (MAG-DELAY) 535 (64 MG) MG TBCR Take 1 tablet (64 mg total) by mouth 2 (two) times daily. 12/25/13  Yes Bartholomew Crews, MD  metFORMIN (GLUCOPHAGE) 1000 MG tablet Take 1 tablet (1,000 mg total) by mouth 2 (two) times daily with a meal. 04/25/13  Yes Bartholomew Crews, MD  nitroGLYCERIN (NITROSTAT) 0.4 MG SL tablet Place 1 tablet (0.4 mg total) under the tongue every 5 (five) minutes as needed for chest pain. 08/30/13  Yes Burnell Blanks, MD  PARoxetine (PAXIL) 40 MG tablet Take 1 tablet (40 mg total) by mouth every morning.  04/25/13  Yes Bartholomew Crews, MD  ranitidine (ZANTAC) 150 MG tablet Take 1 tablet (150 mg total) by mouth 2 (two) times daily. Try once a day in the evening. If not effective, increase to one pill twice a day 04/25/13 04/25/14 Yes Bartholomew Crews, MD  traMADol (ULTRAM) 50 MG tablet Take 1 tablet (50 mg total) by mouth 2 (two) times daily as needed. 01/01/14  Yes Carlos Levering Draper, DO  zolpidem (AMBIEN CR) 6.25 MG CR tablet Take 1 tablet (6.25 mg total) by mouth at bedtime as needed for sleep. 04/25/13 12/21/13  Bartholomew Crews, MD    Family History  Family History  Problem Relation Age of Onset  . Hypertension Sister   . Hodgkin's lymphoma Sister  Social History  History   Social History  . Marital Status: Single    Spouse Name: N/A    Number of Children: N/A  . Years of Education: N/A   Occupational History  . Not on file.   Social History Main Topics  . Smoking status: Never Smoker   . Smokeless tobacco: Never Used  . Alcohol Use: No  . Drug Use: No  . Sexual Activity: Not on file   Other Topics Concern  . 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      Review of Systems, as per HPI, otherwise negative General:  No chills, fever, night sweats or weight changes.  Cardiovascular:  No chest pain, dyspnea on exertion, edema, orthopnea, palpitations, paroxysmal nocturnal dyspnea. Dermatological: No rash, lesions/masses Respiratory: No cough, dyspnea Urologic: No hematuria, dysuria Abdominal:   No nausea, vomiting, diarrhea, bright red blood per rectum, melena, or hematemesis Neurologic:  No visual changes, wkns, changes in mental status. All other systems reviewed and are otherwise negative except as noted above.  Physical Exam  Blood pressure 122/56, pulse 71, height 5\' 2"  (1.575 m), weight 213 lb (96.616 kg).  General: Pleasant, NAD Psych: Normal affect. Neuro: Alert and oriented X 3. Moves all extremities  spontaneously. HEENT: Normal  Neck: Supple without bruits or JVD. Lungs:  Resp regular and unlabored, CTA. Heart: RRR no s3, s4, or murmurs. Abdomen: Soft, non-tender, non-distended, BS + x 4.  Extremities: No clubbing, cyanosis,  B/L LE edema up to the knees, week DP/PT pulses . Radials 2+ and equal bilaterally.  Labs:  No results found for this basename: CKTOTAL, CKMB, TROPONINI,  in the last 72 hours Lab Results  Component Value Date   WBC 6.9 12/21/2013   HGB 13.3 12/21/2013   HCT 38.1 12/21/2013   MCV 83.7 12/21/2013   PLT 303 12/21/2013    No results found for this basename: DDIMER   No components found with this basename: POCBNP,     Component Value Date/Time   NA 141 03/12/2014 0842   K 4.1 03/12/2014 0842   CL 100 03/12/2014 0842   CO2 31 03/12/2014 0842   GLUCOSE 188* 03/12/2014 0842   BUN 27* 03/12/2014 0842   CREATININE 1.0 03/12/2014 0842   CREATININE 0.68 04/25/2013 1104   CALCIUM 8.9 03/12/2014 0842   PROT 7.4 03/12/2014 0842   ALBUMIN 3.8 03/12/2014 0842   AST 18 03/12/2014 0842   ALT 14 03/12/2014 0842   ALKPHOS 63 03/12/2014 0842   BILITOT 0.7 03/12/2014 0842   GFRNONAA 88* 12/22/2013 0442   GFRNONAA >89 04/25/2013 1104   GFRAA >90 12/22/2013 0442   GFRAA >89 04/25/2013 1104   Lab Results  Component Value Date   CHOL 124 12/19/2013   HDL 55 12/19/2013   LDLCALC 59 12/19/2013   TRIG 48 12/19/2013    Accessory Clinical Findings  Echocardiogram - 2012 Left ventricle: The cavity size was normal. Wall thickness was normal. Systolic function was vigorous. The estimated ejection fraction was in the range of 65% to 70%. Wall motion was normal; there were no regional wall motion abnormalities. Doppler parameters are consistent with abnormal left ventricular relaxation (grade 1 diastolic dysfunction). - Aortic valve: Trivial regurgitation. - Atrial septum: No defect or patent foramen ovale was identified.  ECG - SR, normal ECG    Assessment & Plan  A very pleasant 68 year old  female   1. Acute on chronic  diastolic CHF - currently euvolemic, improved with metolazone, we will check CMP today (lytes, Crea)  2. CAD - h/o non-obstructive CAD by cath in 2005, normal stress test in May 2015.  3. Claudications - LE arterial Duplex - Possible mild inflow disease on the left. 0-49% bilateral SFA disease, without focal stenosis. Three vesel run-off, bilaterally. We will continue medical management.  4. Hyperlipidemia - continue statin  Follow up in 6 months  Dorothy Spark, MD, [facility] 06/12/2014, 11:03 AM

## 2014-06-12 NOTE — Addendum Note (Signed)
Addended by: Nuala Alpha on: 06/12/2014 11:19 AM   Modules accepted: Orders

## 2014-06-13 ENCOUNTER — Telehealth: Payer: Self-pay | Admitting: Cardiology

## 2014-06-13 NOTE — Telephone Encounter (Signed)
New Message/ Follow Up\  Pt called states she received a call informing her that there is a mediatation that she should refrain from taing.. Pt requests a call back to discuss which medication this was.

## 2014-06-13 NOTE — Telephone Encounter (Signed)
Informed pt of our telephone encounter from 9/1.  Informed pt of lab results and needing to hold her metolazone per Dr Meda Coffee.  Also informed pt that Dr Meda Coffee has ordered for her to wear compression stockings daily.  Informed pt that I will mail the script for compression stockings to her place of residence today.  Pt agrees with the plan, and verbalized understanding of instructions given with feedback.

## 2014-06-20 ENCOUNTER — Other Ambulatory Visit: Payer: Self-pay | Admitting: Internal Medicine

## 2014-06-23 ENCOUNTER — Other Ambulatory Visit: Payer: Self-pay | Admitting: Internal Medicine

## 2014-06-26 ENCOUNTER — Ambulatory Visit (HOSPITAL_COMMUNITY)
Admission: RE | Admit: 2014-06-26 | Discharge: 2014-06-26 | Disposition: A | Discharge status: Home / Self Care | Payer: PRIVATE HEALTH INSURANCE | Source: Ambulatory Visit | Attending: Internal Medicine | Admitting: Internal Medicine

## 2014-06-26 DIAGNOSIS — Z1231 Encounter for screening mammogram for malignant neoplasm of breast: Secondary | ICD-10-CM | POA: Diagnosis not present

## 2014-06-30 ENCOUNTER — Other Ambulatory Visit: Payer: Self-pay | Admitting: Internal Medicine

## 2014-07-12 ENCOUNTER — Encounter: Payer: PRIVATE HEALTH INSURANCE | Admitting: Dietician

## 2014-07-12 ENCOUNTER — Encounter: Payer: Self-pay | Admitting: Internal Medicine

## 2014-07-12 ENCOUNTER — Ambulatory Visit (INDEPENDENT_AMBULATORY_CARE_PROVIDER_SITE_OTHER): Payer: PRIVATE HEALTH INSURANCE | Admitting: Internal Medicine

## 2014-07-12 VITALS — BP 138/59 | HR 85 | Temp 98.2°F | Wt 217.0 lb

## 2014-07-12 DIAGNOSIS — E785 Hyperlipidemia, unspecified: Secondary | ICD-10-CM

## 2014-07-12 DIAGNOSIS — E669 Obesity, unspecified: Secondary | ICD-10-CM

## 2014-07-12 DIAGNOSIS — E1159 Type 2 diabetes mellitus with other circulatory complications: Secondary | ICD-10-CM

## 2014-07-12 DIAGNOSIS — Z0000 Encounter for general adult medical examination without abnormal findings: Secondary | ICD-10-CM

## 2014-07-12 DIAGNOSIS — I5032 Chronic diastolic (congestive) heart failure: Secondary | ICD-10-CM

## 2014-07-12 DIAGNOSIS — Z23 Encounter for immunization: Secondary | ICD-10-CM

## 2014-07-12 DIAGNOSIS — F32A Depression, unspecified: Secondary | ICD-10-CM

## 2014-07-12 DIAGNOSIS — G4700 Insomnia, unspecified: Secondary | ICD-10-CM

## 2014-07-12 DIAGNOSIS — IMO0002 Reserved for concepts with insufficient information to code with codable children: Secondary | ICD-10-CM

## 2014-07-12 DIAGNOSIS — F329 Major depressive disorder, single episode, unspecified: Secondary | ICD-10-CM

## 2014-07-12 DIAGNOSIS — I2510 Atherosclerotic heart disease of native coronary artery without angina pectoris: Secondary | ICD-10-CM

## 2014-07-12 DIAGNOSIS — E1165 Type 2 diabetes mellitus with hyperglycemia: Secondary | ICD-10-CM

## 2014-07-12 DIAGNOSIS — E1151 Type 2 diabetes mellitus with diabetic peripheral angiopathy without gangrene: Secondary | ICD-10-CM

## 2014-07-12 DIAGNOSIS — G894 Chronic pain syndrome: Secondary | ICD-10-CM

## 2014-07-12 LAB — GLUCOSE, CAPILLARY: Glucose-Capillary: 131 mg/dL — ABNORMAL HIGH (ref 70–99)

## 2014-07-12 LAB — POCT GLYCOSYLATED HEMOGLOBIN (HGB A1C): Hemoglobin A1C: 6.7

## 2014-07-12 MED ORDER — DICLOFENAC SODIUM 1 % TD GEL: 4.0000 g | Freq: Four times a day (QID) | TRANSDERMAL | Status: DC

## 2014-07-12 MED ORDER — LIRAGLUTIDE 18 MG/3ML ~~LOC~~ SOPN: 0.6000 mL | PEN_INJECTOR | Freq: Every day | SUBCUTANEOUS | Status: DC

## 2014-07-12 NOTE — Progress Notes (Signed)
   Subjective:    Patient ID: [name] [name], female    DOB: 19-Jun-1946, 68 y.o.   MRN: [id number]  Diabetes Associated symptoms include polyuria. Pertinent negatives for diabetes include no chest pain.    Please see the A&P for the status of the pt's chronic medical problems.   Review of Systems  Constitutional: Negative for unexpected weight change.  Respiratory: Negative for shortness of breath.   Cardiovascular: Positive for leg swelling. Negative for chest pain.  Endocrine: Positive for polyuria.  Neurological:       Numbness and tingling of L forearm - fingers to elbow  Psychiatric/Behavioral: Negative for sleep disturbance.       Objective:   Physical Exam  Constitutional: She is oriented to person, place, and time. She appears well-developed and well-nourished. No distress.  Eyes: Conjunctivae and EOM are normal. Right eye exhibits no discharge. Left eye exhibits no discharge. No scleral icterus.  Neck: Normal range of motion. Neck supple.  Cardiovascular: Normal rate, regular rhythm and normal heart sounds.   No carotid bruits. Prominent R carotid pulse  Pulmonary/Chest: Effort normal and breath sounds normal. No respiratory distress. She has no wheezes.  Musculoskeletal: She exhibits edema.  +1 LE B  Lymphadenopathy:    She has no cervical adenopathy.  Neurological: She is alert and oriented to person, place, and time.  Skin: Skin is warm and dry. She is not diaphoretic.  Psychiatric: She has a normal mood and affect. Her behavior is normal. Judgment and thought content normal.          Assessment & Plan:

## 2014-07-12 NOTE — Assessment & Plan Note (Addendum)
She remains on her ASA, BB, and statin. Never smoker. For FLP today. Asymptomatic. Follows with [employer] cards.

## 2014-07-12 NOTE — Assessment & Plan Note (Signed)
Well controlled on her Paxil. Cont

## 2014-07-12 NOTE — Assessment & Plan Note (Signed)
She remains on her Lipitor 20 and her LDL was 59 on 12/2013. Cont med. No side effects.

## 2014-07-12 NOTE — Assessment & Plan Note (Signed)
She is taking Victoza 0.6 QD - unable to tolerate the higher dose. She denies lows. She brought a handwritten log but no date. A few in high 100's but overall OK. Her A1C decreased from 9.2 to 6.7!!! She will continue Victoza and her metformin 1000 BID and lantus 20. She just got eye exam and we are getting results. Flu shot today.   Lab Results  Component Value Date   HGBA1C 6.7 07/12/2014

## 2014-07-12 NOTE — Assessment & Plan Note (Signed)
Cards had added metolazone to her lasix but her Cr increased from 0.9 to 1.4 and it was stopped. She has not gotten the stockings yet and is using support stockings instead. She states her swelling is better anyway.

## 2014-07-12 NOTE — Assessment & Plan Note (Signed)
Her BP is well controlled on her norvasc 5, atenolol 100, benazepril 40, lasix 40 and imdur 30. Cont meds. BP Readings from Last 3 Encounters:  07/12/14 138/59  06/12/14 122/56  03/27/14 129/71

## 2014-07-12 NOTE — Patient Instructions (Addendum)
1. Great job on your sugar! 2. See me in three months 3. We gave you your flu shot and Prevnar today 4. I will research your pap smears and call you

## 2014-07-12 NOTE — Assessment & Plan Note (Signed)
Wt Readings from Last 3 Encounters:  07/12/14 217 lb (98.431 kg)  06/12/14 213 lb (96.616 kg)  03/27/14 221 lb (100.245 kg)    Her weight is overall stable. Does need to loose but had success with her A1C!

## 2014-07-12 NOTE — Assessment & Plan Note (Signed)
She only uses her Ambien PRN.

## 2014-07-12 NOTE — Assessment & Plan Note (Signed)
[name] [name] asked about whether she needed Pap's. Her last was 4/06 and suggestive of but not diagnostic of ASCUS. It was sent for HPV but I cannot find results and asked Linus Orn and Vickie to see if they can find.

## 2014-07-12 NOTE — Assessment & Plan Note (Addendum)
Her L forearm from fingers to elbow are tingling and numb. She is R handed but sometimes drops things from R hand. She was seeing Dr Burney Gauze who rec surgery but it was never sch and pt states she will F/U with Dr Burney Gauze.

## 2014-07-25 ENCOUNTER — Encounter: Payer: Self-pay | Admitting: Internal Medicine

## 2014-08-03 ENCOUNTER — Encounter: Payer: Self-pay | Admitting: Internal Medicine

## 2014-08-03 ENCOUNTER — Ambulatory Visit (INDEPENDENT_AMBULATORY_CARE_PROVIDER_SITE_OTHER): Payer: PRIVATE HEALTH INSURANCE | Admitting: Internal Medicine

## 2014-08-03 ENCOUNTER — Other Ambulatory Visit (HOSPITAL_COMMUNITY)
Admission: RE | Admit: 2014-08-03 | Discharge: 2014-08-03 | Disposition: A | Discharge status: Home / Self Care | Payer: PRIVATE HEALTH INSURANCE | Source: Ambulatory Visit | Attending: Internal Medicine | Admitting: Internal Medicine

## 2014-08-03 VITALS — BP 132/58 | HR 83 | Temp 98.0°F | Resp 20 | Ht 60.5 in | Wt 219.0 lb

## 2014-08-03 DIAGNOSIS — IMO0002 Reserved for concepts with insufficient information to code with codable children: Secondary | ICD-10-CM | POA: Insufficient documentation

## 2014-08-03 DIAGNOSIS — Z113 Encounter for screening for infections with a predominantly sexual mode of transmission: Secondary | ICD-10-CM | POA: Diagnosis present

## 2014-08-03 DIAGNOSIS — Z124 Encounter for screening for malignant neoplasm of cervix: Secondary | ICD-10-CM | POA: Diagnosis present

## 2014-08-03 DIAGNOSIS — N760 Acute vaginitis: Secondary | ICD-10-CM | POA: Insufficient documentation

## 2014-08-03 DIAGNOSIS — E1165 Type 2 diabetes mellitus with hyperglycemia: Secondary | ICD-10-CM

## 2014-08-03 DIAGNOSIS — E1151 Type 2 diabetes mellitus with diabetic peripheral angiopathy without gangrene: Secondary | ICD-10-CM

## 2014-08-03 DIAGNOSIS — R896 Abnormal cytological findings in specimens from other organs, systems and tissues: Secondary | ICD-10-CM

## 2014-08-03 DIAGNOSIS — Z0000 Encounter for general adult medical examination without abnormal findings: Secondary | ICD-10-CM

## 2014-08-03 HISTORY — DX: Reserved for concepts with insufficient information to code with codable children: IMO0002

## 2014-08-03 LAB — GLUCOSE, CAPILLARY: Glucose-Capillary: 136 mg/dL — ABNORMAL HIGH (ref 70–99)

## 2014-08-03 NOTE — Patient Instructions (Signed)
General Instructions:  Please bring your medicines with you each time you come to clinic.  Medicines may include prescription medications, over-the-counter medications, herbal remedies, eye drops, vitamins, or other pills.  We will call you with the results   Progress Toward Treatment Goals:  Treatment Goal 08/03/2014  Hemoglobin A1C -  Blood pressure at goal    Self Care Goals & Plans:  Self Care Goal 07/12/2014  Manage my medications take my medicines as prescribed; bring my medications to every visit  Monitor my health keep track of my blood glucose; bring my glucose meter and log to each visit; check my feet daily  Eat healthy foods eat foods that are low in salt; eat baked foods instead of fried foods  Be physically active -    Home Blood Glucose Monitoring 03/27/2014  Check my blood sugar 3 times a day  When to check my blood sugar before meals     Care Management & Community Referrals:  Referral 03/27/2014  Referrals made for care management support none needed  Referrals made to community resources -

## 2014-08-03 NOTE — Assessment & Plan Note (Signed)
papsmear done today. Noted prior pap of ASCUS.

## 2014-08-03 NOTE — Assessment & Plan Note (Addendum)
Noted hx of ASCUS on prior pap. Noted to have thick pale discharge on exam today with speckled blood on cervix and large os. No dysuria, pruritis, hematuria noted per patient.   Per Dr. Lynnae January on overview review, also noted to be HPV+ high risk.   -i sent the papsmear today for repeat testing and also included gonorrhea, chlamydia, trichomonas, gardnerella, and candida testing as well per patient request.  -could consider GYN consult in the future pending results  Addendum 10/29: papsmear negative for HPV and NEGATIVE FOR INTRAEPITHELIAL LESIONS OR MALIGNANCY. +BV, will treat with metronidazole 500mg  bid x7 days

## 2014-08-03 NOTE — Progress Notes (Signed)
Subjective:   Patient ID: [name] [name] female   DOB: 05-17-1946 68 y.o.   MRN: [id number]  HPI: [name] [name] is a 68 y.o. female with well controlled DM2 and other PMH as listed below presenting to opc today for papsmear.   Cervical cancer screening: endorses last papsmear 4-5 years ago but does not recall results, last papsmear in epic 01/2005 noted for ASCUS.  No recent sex for past 5-6 years, no dyuria, foul order, abdominal pain, hematuria, or bloody discharge. Does endorse occasional yellow discharge. No itching. Does want to add screening for gonorrhea and chalmydia with papsmear today as well.   Past Medical History  Diagnosis Date  . DIABETES MELLITUS, TYPE II 08/26/2006    Insulin dependent. Victoza added 03/2014  . Chronic pain syndrome 01/03/2013    Multifactorial. Non opioid requiring.   L2-5 fusion, with hardware at L2-3, stable per MRI 2010. Followed by neurosurgery (Dr. 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    [name] DEPRESSION 08/26/2006    On paxil    . DYSLIPIDEMIA 11/01/2006    LDL goal < 100, 70 if can achieve without side effects.     . OBESITY 08/26/2006    BMI 39.5. Obesity Class 2.     . GERD 08/26/2006    Heartburn QHS.     [name] PVD (peripheral vascular disease) 03/27/2014    2015 LE arterial Duplex - Possible mild inflow disease on the left. 0-49% bilateral SFA disease, without focal stenosis. Three vesel run-off, bilaterally. Medical tx.   [name] CAD 08/26/2006    Cath 07/05:multiple areas of nonobstructive disease = medical & RF mgmt, well preserved global systolic function. Dr. Lia Foyer. Nuclear med stress test 01/2014 : Normal stress nuclear study. LV Ejection Fraction: 76%. LV Wall Motion: NL LV Function; NL Wall Motion.      [name] HYPERTENSION 08/26/2006    On BB, ACEI, lasix, norvasc, metolazone, and imdur.      Current Outpatient Prescriptions  Medication Sig Dispense Refill  . ACCU-CHEK AVIVA PLUS  test strip as directed.      . Alcohol Swabs (ALCOHOL PREP) 70 % PADS as directed.      [name] amLODipine (NORVASC) 5 MG tablet take 1 tablet by mouth once daily  90 tablet  3  . aspirin 81 MG tablet Take 1 tablet (81 mg total) by mouth daily.  90 tablet  3  . ASSURE COMFORT LANCETS 30G MISC as directed.      [name] atenolol (TENORMIN) 100 MG tablet take 1/2 tablet by mouth once daily  45 tablet  3  . atorvastatin (LIPITOR) 20 MG tablet take 1 tablet by mouth once daily  90 tablet  3  . benazepril (LOTENSIN) 40 MG tablet take 1 tablet by mouth once daily  90 tablet  3  . Blood Glucose Calibration (ACCU-CHEK AVIVA) SOLN as directed.      . calcium carbonate (OS-CAL) 600 MG TABS Take 1 tablet (600 mg total) by mouth 2 (two) times daily with a meal.  180 tablet  3  . cetaphil (CETAPHIL) cream Apply 1 application topically 2 (two) times daily as needed (pain).      . cholecalciferol (VITAMIN D) 1000 UNITS tablet Take 1 tablet (1,000 Units total) by mouth daily.  30 tablet  5  . Cyanocobalamin 1000 MCG CAPS Take 1,000 mcg by mouth daily.  90 capsule  3  . diclofenac sodium (VOLTAREN) 1 %  GEL Apply 4 g topically 4 (four) times daily. Apply small amount to affected area up to 4 times a day.  100 g  5  . ferrous sulfate 325 (65 FE) MG tablet take 1 tablet by mouth twice a day with food  180 tablet  3  . furosemide (LASIX) 40 MG tablet take 1 tablet by mouth once daily  90 tablet  3  . gabapentin (NEURONTIN) 400 MG capsule take 1 capsule by mouth three times a day  270 capsule  3  . isosorbide mononitrate (IMDUR) 30 MG 24 hr tablet take 1 tablet by mouth once daily  90 tablet  3  . Lancet Devices (ADJUSTABLE LANCING DEVICE) MISC as directed.      [name] LANTUS SOLOSTAR 100 UNIT/ML Solostar Pen inject 20 units at bedtime  45 mL  3  . Liraglutide 18 MG/3ML SOPN Inject 0.6 mLs as directed daily. Inject 0.6 mg daily for one week then inject 1.2 mg daily.  9 mL  11  . loratadine (CLARITIN) 10 MG tablet Take 10 mg by mouth  daily as needed for allergies.      . Magnesium Chloride (MAG-DELAY) 535 (64 MG) MG TBCR Take 1 tablet (64 mg total) by mouth 2 (two) times daily.  180 tablet  3  . metFORMIN (GLUCOPHAGE) 1000 MG tablet take 1 tablet by mouth twice a day with food  180 tablet  3  . nitroGLYCERIN (NITROSTAT) 0.4 MG SL tablet Place 1 tablet (0.4 mg total) under the tongue every 5 (five) minutes as needed for chest pain.  25 tablet  3  . PARoxetine (PAXIL) 40 MG tablet take 1 tablet by mouth every morning  90 tablet  3  . potassium chloride (K-DUR) 10 MEQ tablet Take 1 tablet (10 mEq total) by mouth daily.  30 tablet  6  . ranitidine (ZANTAC) 150 MG tablet take 1 tablet by mouth twice a day TRY ONCE A DAY IN THE EVENING I NOT EFFECTIVE INCREASE TO 1 TABLET 2 TIMES DAILY  180 tablet  3  . traMADol (ULTRAM) 50 MG tablet Take 1 tablet (50 mg total) by mouth 2 (two) times daily as needed.  60 tablet  1  . Vitamin D, Ergocalciferol, (DRISDOL) 50000 UNITS CAPS capsule take 1 capsule by mouth every week  12 capsule  1  . zolpidem (AMBIEN CR) 6.25 MG CR tablet Take 1 tablet (6.25 mg total) by mouth at bedtime as needed for sleep.  90 tablet  1   No current facility-administered medications for this visit.   Family History  Problem Relation Age of Onset  . Hypertension Sister   . Hodgkin's lymphoma Sister    History   Social History  . Marital Status: Single    Spouse Name: N/A    Number of Children: N/A  . Years of Education: N/A   Social History Main Topics  . Smoking status: Never Smoker   . Smokeless tobacco: Never Used  . Alcohol Use: No  . Drug Use: No  . Sexual Activity: None   Other Topics Concern  . 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    Review of Systems:  Constitutional:  Denies fever, chills  Respiratory:  Denies SOB   Cardiovascular:  Denies chest pain  Gastrointestinal:  Denies nausea, vomiting, abdominal pain   Genitourinary:  Denies dysuria or  hematuria  Skin:  Denies pallor, rash and wound.   Neurological:  Denies headaches, light-headedness, near syncope   Objective:  Physical Exam: Filed Vitals:   08/03/14 1043  BP: 132/58  Pulse: 83  Temp: 98 F (36.7 C)  TempSrc: Oral  Resp: 20  Height: 5' 0.5" (1.537 m)  Weight: 219 lb (99.338 kg)  SpO2: 97%   Vitals reviewed. General: lying on examination table, NAD HEENT: EOMI Cardiac: RRR Pulm: clear to auscultation bilaterally Abd: soft, obese, nontender,BS present GYN: noted thick pale discharge on vaginal and cervical examination, large cervical os with specules of blood, mild bleeding after scraping, no cervical motion tenderness, no obvious masses on palpation   Ext: moving all extremities Neuro: alert and oriented X3  Assessment & Plan:  Discussed with Dr. Beryle Beams

## 2014-08-03 NOTE — Assessment & Plan Note (Signed)
Lab Results  Component Value Date   HGBA1C 6.7 07/12/2014   HGBA1C 9.2 03/27/2014   HGBA1C 8.8 12/19/2013    Assessment: Diabetes control: good control (HgbA1C at goal) Progress toward A1C goal:    Comments: did not bring meter or medications today  Plan: Medications:  continue current medications liraglutide 0.53mL daily Recheck a1c 3 months

## 2014-08-03 NOTE — Progress Notes (Signed)
Medicine attending: Medical history, presenting problems, physical findings, reviewed with resident physician Dr. Jerene Pitch and I concur with her evaluation and management plan. Patient in for followup Pap smear. Some abnormalities of the cervix noted. Appropriate cultures obtained. [name] [name], M.D., [place]

## 2014-08-06 LAB — CYTOLOGY - PAP

## 2014-08-08 ENCOUNTER — Encounter: Payer: Self-pay | Admitting: Internal Medicine

## 2014-08-08 LAB — CERVICOVAGINAL ANCILLARY ONLY
Bacterial vaginitis: POSITIVE — AB
Bacterial vaginitis: POSITIVE — AB
Candida vaginitis: NEGATIVE

## 2014-08-09 ENCOUNTER — Telehealth: Payer: Self-pay | Admitting: *Deleted

## 2014-08-09 MED ORDER — METRONIDAZOLE 500 MG PO TABS: 500.0000 mg | ORAL_TABLET | Freq: Two times a day (BID) | ORAL | Status: AC

## 2014-08-09 NOTE — Telephone Encounter (Signed)
The lab has asked me to bring the results of recent visit to your attention, please review the specimen results from 10/23.  Sent to Conservation officer, nature and Eula Fried

## 2014-08-09 NOTE — Telephone Encounter (Signed)
I have prescribed flagyl 500mg  po bid for bacterial vaginosis. Please let patient know of results and to start medication. I am away from the clinic until Monday.   Thank you,  Dr. Barnetta Chapel

## 2014-08-09 NOTE — Addendum Note (Signed)
Addended by: Wilber Oliphant on: 08/09/2014 10:38 PM   Modules accepted: Orders

## 2014-08-10 NOTE — Telephone Encounter (Signed)
Called pt.

## 2014-09-04 ENCOUNTER — Other Ambulatory Visit: Payer: Self-pay | Admitting: *Deleted

## 2014-09-04 MED ORDER — PEN NEEDLES 31G X 6 MM MISC: 1.0000 | Status: DC | PRN

## 2014-09-11 ENCOUNTER — Ambulatory Visit (INDEPENDENT_AMBULATORY_CARE_PROVIDER_SITE_OTHER): Payer: PRIVATE HEALTH INSURANCE | Admitting: Sports Medicine

## 2014-09-11 ENCOUNTER — Encounter: Payer: Self-pay | Admitting: Sports Medicine

## 2014-09-11 VITALS — BP 114/69 | HR 74 | Ht 61.0 in | Wt 219.0 lb

## 2014-09-11 DIAGNOSIS — M25512 Pain in left shoulder: Secondary | ICD-10-CM

## 2014-09-11 MED ORDER — METHYLPREDNISOLONE ACETATE 40 MG/ML IJ SUSP
40.0000 mg | Freq: Once | INTRAMUSCULAR | Status: AC
  Administered 2014-09-11: 40 mg via INTRA_ARTICULAR

## 2014-09-11 NOTE — Progress Notes (Signed)
   Subjective:    Patient ID: [name] [name], female    DOB: 07-28-1946, 68 y.o.   MRN: [id number]  HPI chief complaint: Left shoulder pain  Patient comes in today complaining of returning left shoulder pain. She had similar pain earlier this year that responded to a subacromial cortisone injection. 3-4 months ago her pain began to return. It is diffuse around the shoulder but also radiates up into her neck as well as down into her forearm and her hand. She is also getting some associated numbness and tingling into her hand. She has a history of bilateral CMC DJD and was recently evaluated by one of the local hand specialists who recommended surgery but she is not ready to pursue this yet. She denies any recent trauma. No fevers or chills. Pain does awaken her at night. She is right-hand dominant.  Intermittent medical history reviewed Surgical history reviewed Current medications reviewed Allergies reviewed    Review of Systems     Objective:   Physical Exam  Well-developed, well-nourished. No acute distress. Awake alert and oriented 3. Vital signs reviewed.  Left shoulder: Patient has full range of motion but a positive painful arc. Positive empty can, positive Hawkins. Rotator cuff strength is 5/5 but reproducible pain with resisted supraspinatus. No tenderness over the bicipital groove. Negative Spurling's. No atrophy. Good radial and ulnar pulses distally.      Assessment & Plan:  Left shoulder pain likely secondary to rotator cuff tendinopathy  Since her symptoms are identical in nature to what she experienced previously this year and given her good response to a previous subacromial cortisone injection I recommended that we repeat that injection today for diagnostic as well as therapeutic reasons. Patient agrees. Risks and benefits were explained prior to the injection including the risk of transient hyperglycemia. Patient will follow-up with me in 3-4 weeks. If pain persists or  if pain resolves but numbness and tingling persists I would consider merits of an EMG/nerve conduction study to rule out cervical radiculopathy and carpal tunnel syndrome.  Consent obtained and verified. Time-out conducted. Noted no overlying erythema, induration, or other signs of local infection. Skin prepped in a sterile fashion. Topical analgesic spray: Ethyl chloride. Joint: left shoulder (subacromial) Needle: 25g 1.5 inch Completed without difficulty. Meds: 3cc 1% xylocaine, 1cc (40mg ) depomedrol  Advised to call if fevers/chills, erythema, induration, drainage, or persistent bleeding.

## 2014-09-15 ENCOUNTER — Other Ambulatory Visit: Payer: Self-pay | Admitting: Cardiology

## 2014-09-15 NOTE — Telephone Encounter (Signed)
Rx was sent to pharmacy electronically. 

## 2014-10-09 ENCOUNTER — Encounter: Payer: Self-pay | Admitting: Sports Medicine

## 2014-10-09 ENCOUNTER — Ambulatory Visit (INDEPENDENT_AMBULATORY_CARE_PROVIDER_SITE_OTHER): Payer: PRIVATE HEALTH INSURANCE | Admitting: Sports Medicine

## 2014-10-09 VITALS — BP 110/68 | HR 83 | Ht 61.0 in | Wt 219.0 lb

## 2014-10-09 DIAGNOSIS — M25512 Pain in left shoulder: Secondary | ICD-10-CM

## 2014-10-09 NOTE — Progress Notes (Signed)
   Subjective:    Patient ID: [name] [name], female    DOB: 1946-02-07, 68 y.o.   MRN: [id number]  HPI  Patient comes in today for follow-up on left shoulder pain. I injected her shoulder with a subacromial cortisone injection about a month ago. This did result in improved shoulder pain but she continues to complain of pain and numbness in her left hand and wrist. This discomfort is present constantly. She denies any pain in her neck but does occasionally get radiating numbness and tingling up her forearm to her elbow. She has also noticed weakness in this arm particularly with holding objects.     Review of Systems     Objective:   Physical Exam  Well-developed, well-nourished. No acute distress. Sitting comfortable in the exam room. Vital signs reviewed  Left shoulder: Patient demonstrates full active and passive range of motion with no pain. Negative empty can, negative Hawkins. Rotator cuff strength is 5/5 and is not reproducible of pain.  Patient has a positive Spurling's to the left. Mildly positive Tinel's at the cubital tunnel on the left. Negative Tinel's at the carpal tunnel. Negative Phalen's. No noticeable atrophy. She does have a decreased grip on the left compared to the right. Good radial and ulnar pulses.       Assessment & Plan:  Left hand pain and numbness-rule out cervical radiculopathy  EMG/nerve conduction study of the left upper extremity to rule out cervical radiculopathy as well as more distal mononeuropathies. Patient will follow-up with me 2-3 days after that study to go over the results and delineate further treatment.

## 2014-10-18 ENCOUNTER — Encounter: Payer: Self-pay | Admitting: Internal Medicine

## 2014-10-18 ENCOUNTER — Ambulatory Visit (INDEPENDENT_AMBULATORY_CARE_PROVIDER_SITE_OTHER): Payer: Medicare Other | Admitting: Internal Medicine

## 2014-10-18 VITALS — BP 133/67 | HR 85 | Temp 98.2°F | Wt 217.2 lb

## 2014-10-18 DIAGNOSIS — I2510 Atherosclerotic heart disease of native coronary artery without angina pectoris: Secondary | ICD-10-CM

## 2014-10-18 DIAGNOSIS — E1151 Type 2 diabetes mellitus with diabetic peripheral angiopathy without gangrene: Secondary | ICD-10-CM | POA: Diagnosis not present

## 2014-10-18 DIAGNOSIS — E1165 Type 2 diabetes mellitus with hyperglycemia: Secondary | ICD-10-CM

## 2014-10-18 DIAGNOSIS — I10 Essential (primary) hypertension: Secondary | ICD-10-CM

## 2014-10-18 DIAGNOSIS — E785 Hyperlipidemia, unspecified: Secondary | ICD-10-CM

## 2014-10-18 DIAGNOSIS — E1159 Type 2 diabetes mellitus with other circulatory complications: Secondary | ICD-10-CM | POA: Diagnosis not present

## 2014-10-18 DIAGNOSIS — Z79899 Other long term (current) drug therapy: Secondary | ICD-10-CM

## 2014-10-18 DIAGNOSIS — G4700 Insomnia, unspecified: Secondary | ICD-10-CM

## 2014-10-18 DIAGNOSIS — I5032 Chronic diastolic (congestive) heart failure: Secondary | ICD-10-CM

## 2014-10-18 DIAGNOSIS — F329 Major depressive disorder, single episode, unspecified: Secondary | ICD-10-CM | POA: Diagnosis not present

## 2014-10-18 DIAGNOSIS — F32A Depression, unspecified: Secondary | ICD-10-CM

## 2014-10-18 DIAGNOSIS — D6489 Other specified anemias: Secondary | ICD-10-CM

## 2014-10-18 DIAGNOSIS — E669 Obesity, unspecified: Secondary | ICD-10-CM | POA: Diagnosis not present

## 2014-10-18 DIAGNOSIS — IMO0002 Reserved for concepts with insufficient information to code with codable children: Secondary | ICD-10-CM

## 2014-10-18 DIAGNOSIS — Z7982 Long term (current) use of aspirin: Secondary | ICD-10-CM

## 2014-10-18 DIAGNOSIS — Z794 Long term (current) use of insulin: Secondary | ICD-10-CM

## 2014-10-18 LAB — BASIC METABOLIC PANEL WITH GFR
BUN: 25 mg/dL — ABNORMAL HIGH (ref 6–23)
CO2: 25 mEq/L (ref 19–32)
Calcium: 9.2 mg/dL (ref 8.4–10.5)
Chloride: 108 mEq/L (ref 96–112)
Creat: 0.87 mg/dL (ref 0.50–1.10)
GFR, Est African American: 79 mL/min
GFR, Est Non African American: 68 mL/min
Glucose, Bld: 68 mg/dL — ABNORMAL LOW (ref 70–99)
Potassium: 4.5 mEq/L (ref 3.5–5.3)
Sodium: 142 mEq/L (ref 135–145)

## 2014-10-18 LAB — LIPID PANEL
Cholesterol: 104 mg/dL (ref 0–200)
HDL: 46 mg/dL (ref >39)
LDL Cholesterol: 47 mg/dL (ref 0–99)
Total CHOL/HDL Ratio: 2.3 Ratio
Triglycerides: 56 mg/dL (ref <150)
VLDL: 11 mg/dL (ref 0–40)

## 2014-10-18 LAB — MAGNESIUM: Magnesium: 1.3 mg/dL — ABNORMAL LOW (ref 1.5–2.5)

## 2014-10-18 LAB — GLUCOSE, CAPILLARY: Glucose-Capillary: 94 mg/dL (ref 70–99)

## 2014-10-18 LAB — POCT GLYCOSYLATED HEMOGLOBIN (HGB A1C): Hemoglobin A1C: 7.2

## 2014-10-18 LAB — FERRITIN: Ferritin: 123 ng/mL (ref 10–291)

## 2014-10-18 NOTE — Assessment & Plan Note (Signed)
Trace LE edema. Metolazone was added to her lasix but her Cr increased from 0.9 to 1.4 and it was stopped in 2015. Will repeat Cr today to ensure it cont to trend down.

## 2014-10-18 NOTE — Assessment & Plan Note (Signed)
Lab Results  Component Value Date   HGBA1C 7.2 10/18/2014    On metformin 1000 BID, lantus 20, and victoza 0.6 QD. A1C decreased from 9.2 to 6.7 with this regimen but up to 7.2 today. Holidays resulted in dietary changes so to focus on healthy eating and will recheck three months.

## 2014-10-18 NOTE — Progress Notes (Signed)
   Subjective:    Patient ID: [name] [name], female    DOB: 01-04-1946, 69 y.o.   MRN: [id number]  HPI  Please see the A&P for the status of the pt's chronic medical problems.  Review of Systems  Respiratory:       + DOE  Cardiovascular: Positive for leg swelling. Negative for chest pain.  Psychiatric/Behavioral: Positive for sleep disturbance. Negative for dysphoric mood.       Objective:   Physical Exam  Constitutional: She is oriented to person, place, and time. She appears well-developed and well-nourished. No distress.  HENT:  Head: Normocephalic and atraumatic.  Right Ear: External ear normal.  Left Ear: External ear normal.  Nose: Nose normal.  Eyes: Conjunctivae and EOM are normal.  Cardiovascular: Normal rate and regular rhythm.   Murmur heard. 2/6 sys murmur L sternal border  Pulmonary/Chest: Effort normal and breath sounds normal.  Abdominal: Bowel sounds are normal.  Musculoskeletal: Normal range of motion. She exhibits edema.  Trace edema L>R  Neurological: She is alert and oriented to person, place, and time.  Skin: Skin is warm and dry. She is not diaphoretic.  Psychiatric: She has a normal mood and affect. Her behavior is normal. Judgment and thought content normal.          Assessment & Plan:

## 2014-10-18 NOTE — Assessment & Plan Note (Signed)
Last level was low, so recheck today.

## 2014-10-18 NOTE — Assessment & Plan Note (Signed)
Cont to be well controlled on Paxil.

## 2014-10-18 NOTE — Patient Instructions (Signed)
I will mail you your results You are doing GREAT with your blood pressure and sugar !! Keep up the good work!

## 2014-10-18 NOTE — Assessment & Plan Note (Signed)
BP Readings from Last 3 Encounters:  10/18/14 133/67  10/09/14 110/68  09/11/14 114/69   Great control! Cont norvasc 5, atenolol 100, lotensin 40, and lasix 40. Also on imdur 30.

## 2014-10-18 NOTE — Assessment & Plan Note (Signed)
Uses Ambien only occasionally. No concern. Cont PRN.

## 2014-10-18 NOTE — Assessment & Plan Note (Signed)
Has been losing weight, now stable but holiday recently.

## 2014-10-18 NOTE — Assessment & Plan Note (Signed)
She remains on her ASA, BB, and statin. Never smoker. Asymptomatic but can't walk fast so no strenuous activity. Follows with [employer] cards.

## 2014-10-18 NOTE — Assessment & Plan Note (Signed)
Since getting blood today, will recheck FLP. On lipitor 20. LDL 59 12/2013.

## 2014-10-20 ENCOUNTER — Other Ambulatory Visit: Payer: Self-pay | Admitting: Internal Medicine

## 2014-10-22 ENCOUNTER — Telehealth: Payer: Self-pay | Admitting: *Deleted

## 2014-10-22 NOTE — Telephone Encounter (Signed)
Her med list has Vit D 1000 QD. This is the dose she should be on. Pls phone in [place]

## 2014-10-22 NOTE — Telephone Encounter (Signed)
Called to pharm 

## 2014-10-22 NOTE — Telephone Encounter (Signed)
Pt has appt with Dr. Ron Agee for NCS on 10/30/14

## 2014-10-26 ENCOUNTER — Encounter: Payer: Self-pay | Admitting: Internal Medicine

## 2014-10-30 DIAGNOSIS — G5602 Carpal tunnel syndrome, left upper limb: Secondary | ICD-10-CM | POA: Diagnosis not present

## 2014-11-01 ENCOUNTER — Other Ambulatory Visit: Payer: Self-pay | Admitting: Internal Medicine

## 2014-11-05 NOTE — Telephone Encounter (Signed)
Last level stil suboptimal. Will cont high dose for now and recheck level next appt.

## 2014-11-07 ENCOUNTER — Other Ambulatory Visit: Payer: Self-pay | Admitting: Cardiology

## 2014-11-21 ENCOUNTER — Ambulatory Visit: Payer: Medicare Other | Admitting: Sports Medicine

## 2014-11-26 ENCOUNTER — Ambulatory Visit: Payer: Medicare Other | Admitting: Sports Medicine

## 2014-12-03 ENCOUNTER — Ambulatory Visit (INDEPENDENT_AMBULATORY_CARE_PROVIDER_SITE_OTHER): Payer: Medicare Other | Admitting: Sports Medicine

## 2014-12-03 ENCOUNTER — Encounter: Payer: Self-pay | Admitting: Sports Medicine

## 2014-12-03 VITALS — BP 118/70 | Ht 61.0 in | Wt 219.0 lb

## 2014-12-03 DIAGNOSIS — M5412 Radiculopathy, cervical region: Secondary | ICD-10-CM | POA: Diagnosis not present

## 2014-12-03 DIAGNOSIS — M25512 Pain in left shoulder: Secondary | ICD-10-CM | POA: Diagnosis not present

## 2014-12-03 MED ORDER — TRAMADOL HCL 50 MG PO TABS: 50.0000 mg | ORAL_TABLET | Freq: Two times a day (BID) | ORAL | Status: DC | PRN

## 2014-12-03 NOTE — Progress Notes (Signed)
Patient ID: [name] [name], female   DOB: Nov 05, 1945, 69 y.o.   MRN: [id number]  Patient comes in today for follow-up. An EMG/nerve conduction study done of her left upper extremity does show evidence of a left C6 or C7 radiculopathy. There is also evidence of moderate left carpal tunnel syndrome. Patient continues to complain of diffuse pain and numbness involving the left shoulder, upper arm, forearm, and hand. Symptoms are not limited to the forearm wrist and hand. I believe her symptoms are originating from her cervical spine. I will get x-rays as well as an MRI to evaluate further. I will call her with those results once available. I would consider a diagnostic/therapeutic cervical ESI depending on what the MRI scan shows.

## 2014-12-04 ENCOUNTER — Ambulatory Visit
Admission: RE | Admit: 2014-12-04 | Discharge: 2014-12-04 | Disposition: A | Discharge status: Home / Self Care | Payer: Medicare Other | Source: Ambulatory Visit | Attending: Sports Medicine | Admitting: Sports Medicine

## 2014-12-04 ENCOUNTER — Encounter: Payer: Self-pay | Admitting: Sports Medicine

## 2014-12-04 DIAGNOSIS — M4802 Spinal stenosis, cervical region: Secondary | ICD-10-CM | POA: Diagnosis not present

## 2014-12-04 DIAGNOSIS — M5412 Radiculopathy, cervical region: Secondary | ICD-10-CM

## 2014-12-04 DIAGNOSIS — M5030 Other cervical disc degeneration, unspecified cervical region: Secondary | ICD-10-CM | POA: Diagnosis not present

## 2014-12-04 DIAGNOSIS — M25512 Pain in left shoulder: Secondary | ICD-10-CM

## 2014-12-09 ENCOUNTER — Other Ambulatory Visit: Payer: Self-pay | Admitting: Cardiology

## 2014-12-10 ENCOUNTER — Ambulatory Visit (INDEPENDENT_AMBULATORY_CARE_PROVIDER_SITE_OTHER): Payer: Medicare Other | Admitting: Cardiology

## 2014-12-10 ENCOUNTER — Encounter: Payer: Self-pay | Admitting: Cardiology

## 2014-12-10 VITALS — BP 122/62 | HR 80 | Ht 61.0 in | Wt 219.0 lb

## 2014-12-10 DIAGNOSIS — I739 Peripheral vascular disease, unspecified: Secondary | ICD-10-CM | POA: Diagnosis not present

## 2014-12-10 DIAGNOSIS — I5032 Chronic diastolic (congestive) heart failure: Secondary | ICD-10-CM

## 2014-12-10 MED ORDER — NITROGLYCERIN 0.4 MG SL SUBL: 0.4000 mg | SUBLINGUAL_TABLET | SUBLINGUAL | Status: DC | PRN

## 2014-12-10 NOTE — Telephone Encounter (Signed)
Should the patient still be taking this medication? It was not on patients medications on her chart at todays visit, but it was flagged as patient taking per medication list. Per documentation from todays visit, 1. Acute on chronic diastolic CHF - previously on metolazone, however her creatinine increased from 1.02 1.4 in September so metolazone was discontinued. The medication was refilled in December, so I am not sure. Please advise. Thanks, MI

## 2014-12-10 NOTE — Patient Instructions (Signed)
Your physician recommends that you continue on your current medications as directed. Please refer to the Current Medication list given to you today.  Your physician has requested that you have a lower extremity arterial duplex. This test is an ultrasound of the arteries in the legs. It looks at arterial blood flow in the legs. Allow one hour for Lower Arterial scans. There are no restrictions or special instructions  Your physician wants you to follow-up in: 6 MONTHS with Dr Meda Coffee. You will receive a reminder letter in the mail two months in advance. If you don't receive a letter, please call our office to schedule the follow-up appointment.

## 2014-12-10 NOTE — Progress Notes (Signed)
Patient ID: [name] [name], female   DOB: November 28, 1945, 69 y.o.   MRN: [id number] Patient ID: [name] [name], female   DOB: 1945-11-09, 69 y.o.   MRN: [id number]    Patient Name: [name] [name] Date of Encounter: 12/10/2014  Primary Care Provider:  Larey Dresser, MD Primary Cardiologist:  Dorothy Spark  Problem List   Past Medical History  Diagnosis Date  . DIABETES MELLITUS, TYPE II 08/26/2006    Insulin dependent. Victoza added 03/2014  . Chronic pain syndrome 01/03/2013    Multifactorial. Non opioid requiring.   L2-5 fusion, with hardware at L2-3, stable per MRI 2010. Followed by neurosurgery (Dr. 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    [name] DEPRESSION 08/26/2006    On paxil    . DYSLIPIDEMIA 11/01/2006    LDL goal < 100, 70 if can achieve without side effects.     . OBESITY 08/26/2006    BMI 39.5. Obesity Class 2.     . GERD 08/26/2006    Heartburn QHS.     [name] PVD (peripheral vascular disease) 03/27/2014    2015 LE arterial Duplex - Possible mild inflow disease on the left. 0-49% bilateral SFA disease, without focal stenosis. Three vesel run-off, bilaterally. Medical tx.   [name] CAD 08/26/2006    Cath 07/05:multiple areas of nonobstructive disease = medical & RF mgmt, well preserved global systolic function. Dr. Lia Foyer. Nuclear med stress test 01/2014 : Normal stress nuclear study. LV Ejection Fraction: 76%. LV Wall Motion: NL LV Function; NL Wall Motion.      [name] HYPERTENSION 08/26/2006    On BB, ACEI, lasix, norvasc, metolazone, and imdur.      Past Surgical History  Procedure Laterality Date  . Posterior lumbar interfusion surgery  07/18/07    L2-L3  . Lumbar fusion surgery  10/08    L3-L5  . Rotator cuff surgery    . Cholecystectomy    . Endometrial biospy     Allergies  No Known Allergies  HPI  This is a very nice patient who used to follow with Dr Lia Foyer for non-obstructive CAD, HTN and  hyperlipidemia. Her cath in 2005 showed multiple areas of nonobstructive disease = medical & RF mgmt, well preserved global systolic function. She has been doing well until recently when she developed DOE and pronounced fatigue. She has been very complait with her medications. She is minimally active as her prior knee surgery is limiting her, however able to do all the house chores. Moderate activity would give significant SOB that is progressively worsening. She is concerned about LE edema that has been present for the last two months. She has been taking lasix 40 mg po daily chronically. She is also experiencing claudications on short distances. No palpitations or syncope,  PND or orthopnea.   The patient underwent stress testing that was negative for scar or ischemia, normal left ventricular function and normal blood pressure. The patient states that her lower extremity edema has been significantly improved and so is her shortness of breath. She is compliant with her medications and is trying to walk but is limited because of her orthopedic issues. She is complaining of numbness in her left hand that started a few weeks ago.  06/12/2014 - 3 months follow up, improved LE edema with metolazone, no orthopnea, PND, chest pain or DOE. She is complaint with meds.  12/10/2014 - the patient is coming after 6 months, after the last  visit we discontinue her metolazone as her creatinine increased and she is doing well denies any lower extremity edema, dyspnea on exertion, any chest pain, orthopnea or proximal nocturnal dyspnea. She doesn't have any palpitations or syncope. Her major concern is worsening claudications after very short distance.  Home Medications  Prior to Admission medications   Medication Sig Start Date End Date Taking? Authorizing Provider  amLODipine (NORVASC) 5 MG tablet Take 1 tablet (5 mg total) by mouth daily. 04/25/13 04/25/14 Yes Bartholomew Crews, MD  aspirin 81 MG tablet Take 1 tablet  (81 mg total) by mouth daily. 04/25/13  Yes Bartholomew Crews, MD  atenolol (TENORMIN) 100 MG tablet Take 0.5 tablets (50 mg total) by mouth daily. 04/25/13  Yes Bartholomew Crews, MD  atorvastatin (LIPITOR) 20 MG tablet take 1 tablet by mouth once daily 01/11/14  Yes Bartholomew Crews, MD  benazepril (LOTENSIN) 40 MG tablet take 1 tablet by mouth once daily 04/25/13  Yes Bartholomew Crews, MD  calcium carbonate (OS-CAL) 600 MG TABS Take 1 tablet (600 mg total) by mouth 2 (two) times daily with a meal. 04/25/13  Yes Bartholomew Crews, MD  cetaphil (CETAPHIL) cream Apply 1 application topically 2 (two) times daily as needed (pain).   Yes Historical Provider, MD  cholecalciferol (VITAMIN D) 1000 UNITS tablet Take 1 tablet (1,000 Units total) by mouth daily. 04/26/13  Yes Bartholomew Crews, MD  Cyanocobalamin 1000 MCG CAPS Take 1,000 mcg by mouth daily. 04/25/13  Yes Bartholomew Crews, MD  diclofenac sodium (VOLTAREN) 1 % GEL Apply 4 g topically 4 (four) times daily. Apply small amount to affected area up to 4 times a day. 04/25/13  Yes Bartholomew Crews, MD  ferrous sulfate 325 (65 FE) MG tablet Take 1 tablet (325 mg total) by mouth 2 (two) times daily with a meal. 04/25/13  Yes Bartholomew Crews, MD  furosemide (LASIX) 40 MG tablet Take 1 tablet (40 mg total) by mouth daily. 04/25/13  Yes Bartholomew Crews, MD  gabapentin (NEURONTIN) 400 MG capsule take 1 capsule by mouth three times a day 04/25/13  Yes Bartholomew Crews, MD  Insulin Glargine (LANTUS SOLOSTAR) 100 UNIT/ML SOPN Inject 20 Units into the skin at bedtime. 04/25/13  Yes Bartholomew Crews, MD  isosorbide mononitrate (IMDUR) 30 MG 24 hr tablet Take 1 tablet (30 mg total) by mouth daily. 04/25/13  Yes Bartholomew Crews, MD  loratadine (CLARITIN) 10 MG tablet Take 10 mg by mouth daily as needed for allergies.   Yes Historical Provider, MD  Magnesium Chloride (MAG-DELAY) 535 (64 MG) MG TBCR Take 1 tablet (64 mg total) by mouth 2  (two) times daily. 12/25/13  Yes Bartholomew Crews, MD  metFORMIN (GLUCOPHAGE) 1000 MG tablet Take 1 tablet (1,000 mg total) by mouth 2 (two) times daily with a meal. 04/25/13  Yes Bartholomew Crews, MD  nitroGLYCERIN (NITROSTAT) 0.4 MG SL tablet Place 1 tablet (0.4 mg total) under the tongue every 5 (five) minutes as needed for chest pain. 08/30/13  Yes Burnell Blanks, MD  PARoxetine (PAXIL) 40 MG tablet Take 1 tablet (40 mg total) by mouth every morning. 04/25/13  Yes Bartholomew Crews, MD  ranitidine (ZANTAC) 150 MG tablet Take 1 tablet (150 mg total) by mouth 2 (two) times daily. Try once a day in the evening. If not effective, increase to one pill twice a day 04/25/13 04/25/14 Yes Bartholomew Crews, MD  traMADol (ULTRAM) 50 MG  tablet Take 1 tablet (50 mg total) by mouth 2 (two) times daily as needed. 01/01/14  Yes Carlos Levering Draper, DO  zolpidem (AMBIEN CR) 6.25 MG CR tablet Take 1 tablet (6.25 mg total) by mouth at bedtime as needed for sleep. 04/25/13 12/21/13  Bartholomew Crews, MD    Family History  Family History  Problem Relation Age of Onset  . Hypertension Sister   . Hodgkin's lymphoma Sister     Social History  History   Social History  . Marital Status: Single    Spouse Name: N/A  . Number of Children: N/A  . Years of Education: N/A   Occupational History  . Not on file.   Social History Main Topics  . Smoking status: Never Smoker   . Smokeless tobacco: Never Used  . Alcohol Use: No  . Drug Use: No  . Sexual Activity: Not on file   Other Topics Concern  . 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      Review of Systems, as per HPI, otherwise negative General:  No chills, fever, night sweats or weight changes.  Cardiovascular:  No chest pain, dyspnea on exertion, edema, orthopnea, palpitations, paroxysmal nocturnal dyspnea. Dermatological: No rash, lesions/masses Respiratory: No cough, dyspnea Urologic:  No hematuria, dysuria Abdominal:   No nausea, vomiting, diarrhea, bright red blood per rectum, melena, or hematemesis Neurologic:  No visual changes, wkns, changes in mental status. All other systems reviewed and are otherwise negative except as noted above.  Physical Exam  Blood pressure 122/62, pulse 80, height 5\' 1"  (1.549 m), weight 219 lb (99.338 kg), SpO2 99 %.  General: Pleasant, NAD Psych: Normal affect. Neuro: Alert and oriented X 3. Moves all extremities spontaneously. HEENT: Normal  Neck: Supple without bruits or JVD. Lungs:  Resp regular and unlabored, CTA. Heart: RRR no s3, s4, or murmurs. Abdomen: Soft, non-tender, non-distended, BS + x 4.  Extremities: No clubbing, cyanosis,  B/L LE edema up to the knees, week DP/PT pulses . Radials 2+ and equal bilaterally.  Labs:  No results for input(s): CKTOTAL, CKMB, TROPONINI in the last 72 hours. Lab Results  Component Value Date   WBC 6.9 12/21/2013   HGB 13.3 12/21/2013   HCT 38.1 12/21/2013   MCV 83.7 12/21/2013   PLT 303 12/21/2013    No results found for: DDIMER Invalid input(s): POCBNP    Component Value Date/Time   NA 142 10/18/2014 1043   K 4.5 10/18/2014 1043   CL 108 10/18/2014 1043   CO2 25 10/18/2014 1043   GLUCOSE 68* 10/18/2014 1043   BUN 25* 10/18/2014 1043   CREATININE 0.87 10/18/2014 1043   CREATININE 1.4* 06/12/2014 1124   CALCIUM 9.2 10/18/2014 1043   PROT 7.8 06/12/2014 1124   ALBUMIN 3.8 06/12/2014 1124   AST 20 06/12/2014 1124   ALT 15 06/12/2014 1124   ALKPHOS 69 06/12/2014 1124   BILITOT 0.8 06/12/2014 1124   GFRNONAA 68 10/18/2014 1043   GFRNONAA 88* 12/22/2013 0442   GFRAA 79 10/18/2014 1043   GFRAA >90 12/22/2013 0442   Lab Results  Component Value Date   CHOL 104 10/18/2014   HDL 46 10/18/2014   LDLCALC 47 10/18/2014   TRIG 56 10/18/2014    Accessory Clinical Findings  Echocardiogram - 2012 Left ventricle: The cavity size was normal. Wall thickness was normal. Systolic  function was vigorous. The estimated ejection fraction was in the range of  65% to 70%. Wall motion was normal; there were no regional wall motion abnormalities. Doppler parameters are consistent with abnormal left ventricular relaxation (grade 1 diastolic dysfunction). - Aortic valve: Trivial regurgitation. - Atrial septum: No defect or patent foramen ovale was identified.  ECG - SR, normal ECG    Assessment & Plan  A very pleasant 69 year old female   1. Acute on chronic diastolic CHF -  previously on metolazone, however her creatinine increased from 1.02 1.4 in September so metolazone was discontinued. Her repeat creatinine general 2016 was 0.8, the patient has stable weight and in fact has lost a pound currently 212 pounds and appears euvolemic.   2. CAD - h/o non-obstructive CAD by cath in 2005, normal stress test in May 2015.  3. Claudications - LE arterial Duplex - Possible mild inflow disease on the left. 0-49% bilateral SFA disease, without focal stenosis. Three vesel run-off, bilaterally. Her claudications or worsening we'll repeat bilateral arterial lower extremity duplex and possibly refer to vascular.  4. Hyperlipidemia - continue statin, all lipids at goal in January 2016.  Follow up in 6 months  Dorothy Spark, MD, [facility] 12/10/2014, 10:31 AM

## 2014-12-11 ENCOUNTER — Other Ambulatory Visit (HOSPITAL_COMMUNITY): Payer: Self-pay | Admitting: Cardiology

## 2014-12-11 DIAGNOSIS — I739 Peripheral vascular disease, unspecified: Secondary | ICD-10-CM

## 2014-12-13 ENCOUNTER — Encounter: Payer: Self-pay | Admitting: *Deleted

## 2014-12-13 ENCOUNTER — Telehealth: Payer: Self-pay | Admitting: Internal Medicine

## 2014-12-13 NOTE — Telephone Encounter (Signed)
LOV with DR. Groat on 03/14/2014 being faxed.  Patient has upcoming appointment on 03/22/2015 with their office.

## 2014-12-16 ENCOUNTER — Ambulatory Visit
Admission: RE | Admit: 2014-12-16 | Discharge: 2014-12-16 | Disposition: A | Discharge status: Home / Self Care | Payer: Medicare Other | Source: Ambulatory Visit | Attending: Sports Medicine | Admitting: Sports Medicine

## 2014-12-16 DIAGNOSIS — M5032 Other cervical disc degeneration, mid-cervical region: Secondary | ICD-10-CM | POA: Diagnosis not present

## 2014-12-16 DIAGNOSIS — M4802 Spinal stenosis, cervical region: Secondary | ICD-10-CM | POA: Diagnosis not present

## 2014-12-16 DIAGNOSIS — M25512 Pain in left shoulder: Secondary | ICD-10-CM

## 2014-12-16 DIAGNOSIS — M5412 Radiculopathy, cervical region: Secondary | ICD-10-CM

## 2014-12-20 ENCOUNTER — Telehealth: Payer: Self-pay | Admitting: Sports Medicine

## 2014-12-20 ENCOUNTER — Encounter (HOSPITAL_COMMUNITY): Payer: Medicare Other

## 2014-12-20 NOTE — Telephone Encounter (Signed)
I spoke with the patient on the phone today regarding cervical spine MRI findings. Patient has severe spinal stenosis at C5-C6 and moderate spinal stenosis at C6-C7. A recent EMG/nerve conduction study suggested C6-C7 radiculopathy. Therefore, I will send her for a diagnostic/therapeutic cervical ESI at [facility]. She will follow-up with me 1 to 2 weeks after that injection. If she notes improvement in her symptoms but not complete symptom resolution then we will consider pursuing a second and possibly a third ESI. If she receives a positive yet very limited benefit from the entire series then we may need to consider neurosurgical referral.

## 2014-12-21 ENCOUNTER — Other Ambulatory Visit: Payer: Self-pay | Admitting: *Deleted

## 2014-12-21 DIAGNOSIS — M5010 Cervical disc disorder with radiculopathy, unspecified cervical region: Secondary | ICD-10-CM

## 2014-12-25 ENCOUNTER — Ambulatory Visit (HOSPITAL_COMMUNITY): Payer: Medicare Other | Attending: Cardiology | Admitting: Cardiology

## 2014-12-25 ENCOUNTER — Other Ambulatory Visit: Payer: Self-pay | Admitting: Sports Medicine

## 2014-12-25 DIAGNOSIS — I739 Peripheral vascular disease, unspecified: Secondary | ICD-10-CM

## 2014-12-25 DIAGNOSIS — M5010 Cervical disc disorder with radiculopathy, unspecified cervical region: Secondary | ICD-10-CM

## 2014-12-25 NOTE — Progress Notes (Signed)
ABI performed  

## 2014-12-27 ENCOUNTER — Ambulatory Visit
Admission: RE | Admit: 2014-12-27 | Discharge: 2014-12-27 | Disposition: A | Discharge status: Home / Self Care | Payer: Medicare Other | Source: Ambulatory Visit | Attending: Sports Medicine | Admitting: Sports Medicine

## 2014-12-27 DIAGNOSIS — M542 Cervicalgia: Secondary | ICD-10-CM | POA: Diagnosis not present

## 2014-12-27 DIAGNOSIS — M5010 Cervical disc disorder with radiculopathy, unspecified cervical region: Secondary | ICD-10-CM

## 2014-12-27 MED ORDER — IOHEXOL 300 MG/ML  SOLN
1.0000 mL | Freq: Once | INTRAMUSCULAR | Status: AC | PRN
  Administered 2014-12-27: 1 mL via EPIDURAL

## 2014-12-27 MED ORDER — TRIAMCINOLONE ACETONIDE 40 MG/ML IJ SUSP (RADIOLOGY)
60.0000 mg | Freq: Once | INTRAMUSCULAR | Status: AC
  Administered 2014-12-27: 60 mg via EPIDURAL

## 2014-12-27 NOTE — Discharge Instructions (Signed)

## 2015-01-10 ENCOUNTER — Other Ambulatory Visit: Payer: Self-pay | Admitting: Internal Medicine

## 2015-01-12 ENCOUNTER — Other Ambulatory Visit: Payer: Self-pay | Admitting: Internal Medicine

## 2015-01-17 ENCOUNTER — Encounter: Payer: Self-pay | Admitting: Internal Medicine

## 2015-01-17 ENCOUNTER — Ambulatory Visit (INDEPENDENT_AMBULATORY_CARE_PROVIDER_SITE_OTHER): Payer: Medicare Other | Admitting: Internal Medicine

## 2015-01-17 VITALS — BP 119/57 | HR 80 | Temp 98.2°F | Wt 214.2 lb

## 2015-01-17 DIAGNOSIS — E669 Obesity, unspecified: Secondary | ICD-10-CM

## 2015-01-17 DIAGNOSIS — Z7982 Long term (current) use of aspirin: Secondary | ICD-10-CM | POA: Diagnosis not present

## 2015-01-17 DIAGNOSIS — D649 Anemia, unspecified: Secondary | ICD-10-CM

## 2015-01-17 DIAGNOSIS — Z7189 Other specified counseling: Secondary | ICD-10-CM

## 2015-01-17 DIAGNOSIS — E1151 Type 2 diabetes mellitus with diabetic peripheral angiopathy without gangrene: Secondary | ICD-10-CM | POA: Diagnosis not present

## 2015-01-17 DIAGNOSIS — I2510 Atherosclerotic heart disease of native coronary artery without angina pectoris: Secondary | ICD-10-CM

## 2015-01-17 DIAGNOSIS — Z794 Long term (current) use of insulin: Secondary | ICD-10-CM | POA: Diagnosis not present

## 2015-01-17 DIAGNOSIS — IMO0002 Reserved for concepts with insufficient information to code with codable children: Secondary | ICD-10-CM

## 2015-01-17 DIAGNOSIS — Z23 Encounter for immunization: Secondary | ICD-10-CM

## 2015-01-17 DIAGNOSIS — Z79899 Other long term (current) drug therapy: Secondary | ICD-10-CM | POA: Diagnosis not present

## 2015-01-17 DIAGNOSIS — I739 Peripheral vascular disease, unspecified: Secondary | ICD-10-CM

## 2015-01-17 DIAGNOSIS — D509 Iron deficiency anemia, unspecified: Secondary | ICD-10-CM

## 2015-01-17 DIAGNOSIS — E1165 Type 2 diabetes mellitus with hyperglycemia: Secondary | ICD-10-CM

## 2015-01-17 DIAGNOSIS — I5032 Chronic diastolic (congestive) heart failure: Secondary | ICD-10-CM

## 2015-01-17 DIAGNOSIS — G894 Chronic pain syndrome: Secondary | ICD-10-CM

## 2015-01-17 DIAGNOSIS — Z0000 Encounter for general adult medical examination without abnormal findings: Secondary | ICD-10-CM

## 2015-01-17 DIAGNOSIS — I10 Essential (primary) hypertension: Secondary | ICD-10-CM

## 2015-01-17 LAB — POCT GLYCOSYLATED HEMOGLOBIN (HGB A1C): Hemoglobin A1C: 7.3

## 2015-01-17 LAB — GLUCOSE, CAPILLARY: Glucose-Capillary: 131 mg/dL — ABNORMAL HIGH (ref 70–99)

## 2015-01-17 NOTE — Patient Instructions (Signed)
STOP your iron pills You are doing great with your sugar and blood pressure Talk to the cardiac rehab folks Talk to your daughters about what you would want if you were dying

## 2015-01-18 ENCOUNTER — Encounter: Payer: Self-pay | Admitting: Internal Medicine

## 2015-01-18 DIAGNOSIS — Z7189 Other specified counseling: Secondary | ICD-10-CM | POA: Insufficient documentation

## 2015-01-18 NOTE — Assessment & Plan Note (Signed)
She is CP free. Cont on BB, ASA, and statin.

## 2015-01-18 NOTE — Assessment & Plan Note (Signed)
Cardiac rehab Rx'd.

## 2015-01-18 NOTE — Progress Notes (Signed)
   Subjective:    Patient ID: [name] [name], female    DOB: 1946-09-03, 69 y.o.   MRN: [id number]  HPI  Ms Amaro is here for routine 3 month F/U. Please see the A&P for the status of the pt's chronic medical problems.  Review of Systems  Constitutional: Positive for fatigue. Negative for unexpected weight change.  HENT: Positive for rhinorrhea. Negative for sore throat.   Eyes: Positive for itching.  Respiratory: Positive for shortness of breath. Negative for cough.        Blood in mucus from throat this AM. Never occurred previously.  Cardiovascular: Negative for chest pain.  Gastrointestinal: Positive for nausea.       V x one. Rare event  Musculoskeletal: Positive for myalgias and gait problem.       Legs tired  Skin: Negative for wound.  Allergic/Immunologic: Positive for environmental allergies.  Neurological:       + B LE neuropathy  Psychiatric/Behavioral: Positive for sleep disturbance.       Objective:   Physical Exam  Constitutional: She is oriented to person, place, and time. She appears well-developed and well-nourished. No distress.  HENT:  Head: Normocephalic and atraumatic.  Right Ear: External ear normal.  Left Ear: External ear normal.  Nose: Nose normal.  Eyes: Conjunctivae and EOM are normal. Left eye exhibits no discharge.  Cardiovascular: Normal rate, regular rhythm and normal heart sounds.   Pulmonary/Chest: Effort normal and breath sounds normal.  Musculoskeletal: Normal range of motion. She exhibits no edema or tenderness.  Neurological: She is alert and oriented to person, place, and time.  Skin: Skin is warm and dry. She is not diaphoretic.  Psychiatric: She has a normal mood and affect. Her behavior is normal. Judgment and thought content normal.          Assessment & Plan:

## 2015-01-18 NOTE — Assessment & Plan Note (Signed)
PCV-13 today 

## 2015-01-18 NOTE — Assessment & Plan Note (Signed)
Most recent ferritin 123. I asked her to stop her FeSO4 to simplify her med regimen.

## 2015-01-18 NOTE — Assessment & Plan Note (Signed)
I started end of life / [place] discussion. Adamant no feeding tube / PEG under any circumstance. At first stated no CPR / shock / intubation under any circumstances. But we discussed further and if she has a reversible condition (I gave PNA as example) and would only need these interventions for a limited time, she would accept them. She does not want a prolonged hospital stay and would accept a natrual death instead. I encouraged her to talk to her daughters about these issues and provided her advanced directives and med power of attorney forms.

## 2015-01-18 NOTE — Assessment & Plan Note (Signed)
Has peripheral neuropathy and is on Gaba 400 TID. States not fully effective.  Had lumbar steroid injection that states was not effective.

## 2015-01-18 NOTE — Assessment & Plan Note (Signed)
Lab Results  Component Value Date   HGBA1C 7.3 01/17/2015    A1C steady, had been 7.2. She is on metformin 1000 BID, lantus 20, and Victoza 0.6. She was able to name (but without using names) all three meds. She checks CBG 3 x a day and reports that they are OK. She had her log. Most were good, with occasional high (170 - 200). Therefore, leaving med as is.

## 2015-01-18 NOTE — Assessment & Plan Note (Signed)
Pt is on lasix 40 QD. Had been on metolazone but it was stopped 2015 when her Cr increased. A refill request was sent to Dr Francesca Oman office 2/28 for a metolazone refill. She saw Dr Meda Coffee 2/29 and he inc in her note that the metolazone had been stopped in sep 2015 for Cr increase and that the pt was euvolemic. She addressed the refill on 3/1 and approved the med with 2 refills. The pt picked up the med on 3/1 and a refill on 3/30.  On my visit today, the pt was adamant that she was only on one water pill. She knew she had been on a second water pill but that it had been stopped bc of her kidneys. She states whenever her drug store gives her that med, she just puts it aside.   She is euvolemic and does not appear to need to metolazone despite it being represcribed. I am not certain whether she is or is not taking it. She did not bring her meds today. Even though she is adamant that she is not, she is on a LOT of meds and has low health care literacy. If she is on it, it will have been over one month and I need to check her Cr!    I called her and asked her about the med again. She stated that her daughter picked up the med and that she knows which one it is and that it caused her kidney problems. She again is adamant that she is not taking it. I asked why she would pay for a med and then not take it. She stated her daughter just gets what the pharmacy has filled.  I called [facility] and they verified that she picked up med twice in March. I cancelled the remaining refills.  I am sending Dr Meda Coffee an EPIC message indicting that I cancelled the refills and that if she truly meant for the pt to resume metolazone, she will have to call pt to get her to start taking it, send in refills, and get Cr.

## 2015-01-18 NOTE — Assessment & Plan Note (Signed)
This is big issue. Her size and chronic pain is severely limiting her mobility. She states can only take a few steps and then gets dyspneic, legs weak, and has to stop and rest. ABI's R/O PAD. Live alone. Occ goes to church or store but generally daughter runs her errands. I think that her limited mobility is multifactorial. I explained that exercise would be best to get her where she wants to be because she wants to be able to be more mobile and get out. We dicussed that even though it sounds impossible to ask her to exercise when she gives out after 2 steps, cardiac rehab would start by getting her to take 4 steps, then 6, then 10, etc. She is in agreement to speak to Card Rehab.

## 2015-01-18 NOTE — Assessment & Plan Note (Signed)
BP Readings from Last 3 Encounters:  01/17/15 119/57  12/27/14 119/80  12/10/14 122/62    Her BP is great today. She is on atenolol 100 (recs state 1/2 pill QD but pt denies breaking any pills in half. She gets #45 pills which would be a 90 day supply at 1/2 daily), norvasc 5, lotensin 40, lasix 40, and imdur 30. Cont meds. Med rec with bottle next appt.

## 2015-01-21 ENCOUNTER — Other Ambulatory Visit: Payer: Self-pay | Admitting: Internal Medicine

## 2015-01-21 NOTE — Assessment & Plan Note (Signed)
Last level 36 12/2013. I had received refill request 11/05/14 for 50,000 and refilled it weekly for 12 doses and intended to recheck level. I neglected to recheck level at most recent appt. I do not want to leave on high dose and 1000 IU daily.  1. I will not refill 50,000 IU dose 2. To cont 1000 ID daily dose 3. Vit D level next appt

## 2015-01-21 NOTE — Telephone Encounter (Signed)
See note above. Pls tell her to cont Vit D 1000 IU daily.

## 2015-01-29 ENCOUNTER — Other Ambulatory Visit: Payer: Self-pay | Admitting: Internal Medicine

## 2015-02-11 ENCOUNTER — Telehealth: Payer: Self-pay | Admitting: Cardiology

## 2015-02-11 ENCOUNTER — Ambulatory Visit: Payer: Medicare Other | Admitting: Pharmacist

## 2015-02-11 ENCOUNTER — Encounter: Payer: Self-pay | Admitting: Sports Medicine

## 2015-02-11 ENCOUNTER — Ambulatory Visit (INDEPENDENT_AMBULATORY_CARE_PROVIDER_SITE_OTHER): Payer: Medicare Other | Admitting: Sports Medicine

## 2015-02-11 VITALS — BP 134/61 | Ht 61.0 in | Wt 219.0 lb

## 2015-02-11 DIAGNOSIS — M5412 Radiculopathy, cervical region: Secondary | ICD-10-CM

## 2015-02-11 DIAGNOSIS — Z79899 Other long term (current) drug therapy: Secondary | ICD-10-CM

## 2015-02-11 NOTE — Telephone Encounter (Signed)
Returned call from Jennifer/[facility]. Confirmed that patient is taking both metolazone and furosemide.

## 2015-02-11 NOTE — Telephone Encounter (Signed)
New problem   Want to know if pt is currently taking Metolazone. Please advise.

## 2015-02-11 NOTE — Progress Notes (Signed)
Patient ID: [name] [name], female   DOB: Feb 27, 1946, 69 y.o.   MRN: [id number]  Patient comes in today for follow-up on left arm pain. She recently underwent a single cervical ESI and as a result her left shoulder and arm pain have improved. They have not completely resolved but her symptoms are currently tolerable. She is still experiencing intermittent numbness and tingling into her hand but again she states that this is tolerable. No significant weakness. She continues to deny deep-seated shoulder pain. Based on her response to her initial cervical ESI I talked with her about repeating the injection. She would like to wait on that for now. She understands that if symptoms once again worsen she may feel free to call me and I can order a repeat injection at [facility]. Otherwise, she will follow-up with me as needed.  Total time spent with the patient was 10 minutes discussing her diagnosis and response to treatment thus far.

## 2015-02-13 NOTE — Progress Notes (Addendum)
S: [name] [name] is a 69 y.o. female reports to clinical pharmacist appointment for medication review. Patient did  bring medication bottles.   No Known Allergies   O:    Component Value Date/Time   CHOL 104 10/18/2014 1043   HDL 46 10/18/2014 1043   TRIG 56 10/18/2014 1043   AST 20 06/12/2014 1124   ALT 15 06/12/2014 1124   NA 142 10/18/2014 1043   K 4.5 10/18/2014 1043   CL 108 10/18/2014 1043   CO2 25 10/18/2014 1043   GLUCOSE 68* 10/18/2014 1043   HGBA1C 7.3 01/17/2015 1009   HGBA1C 7.1* 12/22/2011 0916   BUN 25* 10/18/2014 1043   CREATININE 0.87 10/18/2014 1043   CREATININE 1.4* 06/12/2014 1124   CALCIUM 9.2 10/18/2014 1043   GFRNONAA 68 10/18/2014 1043   GFRNONAA 88* 12/22/2013 0442   GFRAA 79 10/18/2014 1043   GFRAA >90 12/22/2013 0442   WBC 6.9 12/21/2013 1801   HGB 13.3 12/21/2013 1801   HCT 38.1 12/21/2013 1801   PLT 303 12/21/2013 1801   TSH 2.215 01/15/2009 2018   Ht Reading:  02/11/15 5\' 1"  (1.549 m)   Wt Reading:  02/11/15 219 lb (99.338 kg)   There is no weight on file to calculate BMI. BP Readings from Last 3 Encounters:  02/11/15 134/61  01/17/15 119/57  12/27/14 119/80   Findings/Recommendations:  Patient reports good adherence  Diastolic CHF: patient states she no longer takes metolazone and this was verified. Pharmacy lists metolazone as discontinued. Contacted Cardiology, Dr. Meda Coffee, 02/14/15 clarified there was confusion since metolazone was still on Epic medication list. Removed from list.  Pain: patient reports good pain control overall on current regimen, although she does experience episodes of poor control (7 out of 10) <3 times per month. Reviewed non-pharmacologic and OTC strategies. If further pharmacologic options needed in the future, can consider increasing dose of gabapentin and/ or switching paroxetine (Beers list med) to venlafaxine or duloxetine.  Insomnia: patient reports taking zolpidem (Beers list med) a few times per month.  Can consider discontinuing in the future if patient still using sparingly.  Supplements  Patient reports no longer taking cyanocobalamin. Can re-evaluate in the future (last labs for B12 done in 2014).  Patient reports no longer taking calcium+D supplementation. Provided education on dietary intake as well as supplementation due to osteopenia.  The patient verbalized understanding of information provided by repeating back concepts discussed.

## 2015-02-13 NOTE — Patient Instructions (Addendum)
Instructions:  Start taking an over-the-counter calcium supplement such as: calcium citrate-vitamin D 500-400 MG-UNIT chewable tablet once daily  Consider over-the-counter Tylenol (acetaminophen) 650 mg three times daily for pain  Calcium Intake Recommendations Calcium in our blood is important for the control of many things, such as:  Blood clotting.  Conducting of nerve impulses.  Muscle contraction.  Maintaining teeth and bone health.  Other body functions. Amount of calcium to consume daily, in milligrams (mg)  1,200 mg  This includes diet and supplementation Document Released: 05/12/2004 Document Revised: 01/23/2013 Document Reviewed: 09/28/2005 [facility] Patient Information 2015 [place], [place]. This information is not intended to replace advice given to you by your health care provider. Make sure you discuss any questions you have with your health care provider.  Vitamin D is also important to help with calcium absorption and bone health.  Calcium; Vitamin D oral tablets What is this medicine? CALCIUM; VITAMIN D (KAL see um; VYE ta min D) is a vitamin supplement. It is used to prevent conditions of low calcium and vitamin D. This medicine may be used for other purposes; ask your health care provider or pharmacist if you have questions. COMMON BRAND NAME(S): Calcarb 600 with Vitamin D, Calcet Plus Vitamin D, Calcitrate + D, Caltrate, Caltrate 600+D, Citracal + D, Citracal MAXIMUM + D, Citracal Petites with Vitamin D, Citrus Calcium Plus D, OSCAL 500 + D, OSCAL Calcium + D3, OSCAL Extra D3, Osteo-Poretical, Oysco 500 + D, Oysco D What should I tell my health care provider before I take this medicine? They need to know if you have any of these conditions: -constipation -dehydration -heart disease -high level of calcium or vitamin D in the blood -high level of phosphate in the blood -kidney disease -kidney stones -liver disease -parathyroid disease -sarcoidosis -stomach  ulcer or obstruction -an unusual or allergic reaction to calcium, vitamin D, tartrazine dye, other medicines, foods, dyes, or preservatives -pregnant or trying to get pregnant -breast-feeding How should I use this medicine? Take this medicine by mouth with a glass of water. Follow the directions on the label. Take with food or within 1 hour after a meal. Take your medicine at regular intervals. Do not take your medicine more often than directed. Talk to your pediatrician regarding the use of this medicine in children. While this medicine may be used in children for selected conditions, precautions do apply. Overdosage: If you think you have taken too much of this medicine contact a poison control center or emergency room at once. NOTE: This medicine is only for you. Do not share this medicine with others. What if I miss a dose? If you miss a dose, take it as soon as you can. If it is almost time for your next dose, take only that dose. Do not take double or extra doses. What may interact with this medicine? Do not take this medicine with any of the following medications: -ammonium chloride -methenamine This medicine may also interact with the following medications: -antibiotics like ciprofloxacin, gatifloxacin, tetracycline -captopril -delavirdine -diuretics -gabapentin -iron supplements -medicines for fungal infections like ketoconazole and itraconazole -medicines for seizures like ethotoin and phenytoin -mineral oil -mycophenolate -other vitamins with calcium, vitamin D, or minerals -quinidine -rosuvastatin -sucralfate -thyroid medicine This list may not describe all possible interactions. Give your health care provider a list of all the medicines, herbs, non-prescription drugs, or dietary supplements you use. Also tell them if you smoke, drink alcohol, or use illegal drugs. Some items may interact with your medicine.  What should I watch for while using this medicine? Taking this  medicine is not a substitute for a well-balanced diet and exercise. Talk with your doctor or health care provider and follow a healthy lifestyle. Do not take this medicine with high-fiber foods, large amounts of alcohol, or drinks containing caffeine. Do not take this medicine within 2 hours of any other medicines. What side effects may I notice from receiving this medicine? Side effects that you should report to your doctor or health care professional as soon as possible: -allergic reactions like skin rash, itching or hives, swelling of the face, lips, or tongue -confusion -dry mouth -high blood pressure -increased hunger or thirst -increased urination -irregular heartbeat -metallic taste -muscle or bone pain -pain when urinating -seizure -unusually weak or tired -weight loss Side effects that usually do not require medical attention (report to your doctor or health care professional if they continue or are bothersome): -constipation -diarrhea -headache -loss of appetite -nausea, vomiting -stomach upset This list may not describe all possible side effects. Call your doctor for medical advice about side effects. You may report side effects to FDA at 1-800-FDA-1088. Where should I keep my medicine? Keep out of the reach of children. Store at room temperature between 15 and 30 degrees C (59 and 86 degrees F). Protect from light. Keep container tightly closed. Throw away any unused medicine after the expiration date. NOTE: This sheet is a summary. It may not cover all possible information. If you have questions about this medicine, talk to your doctor, pharmacist, or health care provider.  2015, Elsevier/Gold Standard. (2008-01-11 17:56:23)

## 2015-02-14 ENCOUNTER — Telehealth: Payer: Self-pay | Admitting: Cardiology

## 2015-02-14 NOTE — Addendum Note (Signed)
Addended by: Forde Dandy on: 02/14/2015 10:40 AM   Modules accepted: Orders, Medications

## 2015-02-14 NOTE — Telephone Encounter (Signed)
Anderson Malta from [facility] called to make sure Metolazone medication was not listed on the pt's medications  To be  taken , because pt does not supposed to take this medication. Anderson Malta is aware that Metolazone is not in pt's medication list.

## 2015-02-14 NOTE — Telephone Encounter (Signed)
New Message   Patients medication list needed to be updated and [facility] needs to get an updated list please give them a call.

## 2015-02-26 ENCOUNTER — Telehealth: Payer: Self-pay | Admitting: *Deleted

## 2015-02-26 NOTE — Telephone Encounter (Addendum)
Received PA request from pt's pharmacy for the zolpidem ER 6.25 mg tabs one tablet daily dispense #30. Will forward the following info to pcp for review, please advise:   The approval criteria is based on the guidance provided by the Centers for Medicare & Medicaid Services (CMS), the [place] for [employer] (NCQA). "Use of High Risk Medications in the Elderly" is measure 238 of the Centers for Medicare & Medicaid Services Physician Quality Reporting System.  Please note: The requested drug is considered a high risk medication (HRM) by CMS and should be avoided in the elderly.     The following alternatives available on formulary are not considered HRMs by CMS and may be a safer choice.  For diagnosis of insomnia, please consider: . Rozerem (ramelteon), trazodone, or Belsomra (suvorexant)     For patients who have or will exceed the plan limit of 90 days supply in a 365 day period, MD must Explain medical reasons why more than the plan limit is needed     [employer] Medicare D ID# [id number]  GRP: [id number] [phone]

## 2015-03-01 ENCOUNTER — Other Ambulatory Visit: Payer: Self-pay | Admitting: Internal Medicine

## 2015-03-01 ENCOUNTER — Telehealth (HOSPITAL_COMMUNITY): Payer: Self-pay | Admitting: Cardiac Rehabilitation

## 2015-03-01 NOTE — Telephone Encounter (Signed)
Yr supply April

## 2015-03-01 NOTE — Telephone Encounter (Signed)
Cardiac rehab Referral received from [name] for PAD.  Unfortunately pt does not have qualifying diagnosis for medicare/medicaid insurance reimbursement for cardiac rehab program.  Pt offered cardiac maintenance self pay program.  Pt unable to afford this self pay expense.  Pt advised to walk 10 minutes 3 times daily taking rest break every 2-3 minutes while walking.  Understanding verbalized.

## 2015-03-04 NOTE — Telephone Encounter (Signed)
Had pcp complete prior authorization form for pt's zolpidem-request was still denied. Per faxed confirmation, pt will need to first try ALL of the preferred meds below.  Will send to pcp for alternative. Please advise .Regenia Skeeter, Branden Shallenberger Cassady5/23/20169:07 AM

## 2015-03-04 NOTE — Telephone Encounter (Signed)
Pls ask her to make appt if she wants a new med for sleep as we will need to discuss

## 2015-03-04 NOTE — Telephone Encounter (Signed)
Called pharm, they had not gotten script electronically, given verbally

## 2015-03-22 LAB — HM DIABETES EYE EXAM

## 2015-03-27 NOTE — Telephone Encounter (Signed)
Late entry-Had spoken with patient on 03/13/2015 and she stated at that time she would discuss an alternative at her next visit with pcp.  Phone call complete.[name]6/15/201612:20 PM

## 2015-03-28 ENCOUNTER — Encounter: Payer: Self-pay | Admitting: *Deleted

## 2015-04-12 ENCOUNTER — Other Ambulatory Visit: Payer: Self-pay | Admitting: Internal Medicine

## 2015-04-12 NOTE — Telephone Encounter (Signed)
Pls sch July or Aug appt with me

## 2015-05-15 ENCOUNTER — Other Ambulatory Visit: Payer: Self-pay | Admitting: *Deleted

## 2015-05-15 MED ORDER — TRAMADOL HCL 50 MG PO TABS: 50.0000 mg | ORAL_TABLET | Freq: Two times a day (BID) | ORAL | Status: DC | PRN

## 2015-05-21 ENCOUNTER — Other Ambulatory Visit: Payer: Self-pay | Admitting: *Deleted

## 2015-05-21 MED ORDER — ASSURE COMFORT LANCETS 30G MISC: Status: DC

## 2015-05-21 MED ORDER — GLUCOSE BLOOD VI STRP: ORAL_STRIP | Status: DC

## 2015-05-23 ENCOUNTER — Ambulatory Visit (INDEPENDENT_AMBULATORY_CARE_PROVIDER_SITE_OTHER): Payer: Medicare Other | Admitting: Internal Medicine

## 2015-05-23 ENCOUNTER — Encounter: Payer: Self-pay | Admitting: Internal Medicine

## 2015-05-23 VITALS — BP 127/53 | HR 88 | Temp 97.6°F | Ht 61.0 in | Wt 215.9 lb

## 2015-05-23 DIAGNOSIS — I10 Essential (primary) hypertension: Secondary | ICD-10-CM

## 2015-05-23 DIAGNOSIS — E1151 Type 2 diabetes mellitus with diabetic peripheral angiopathy without gangrene: Secondary | ICD-10-CM

## 2015-05-23 DIAGNOSIS — E1165 Type 2 diabetes mellitus with hyperglycemia: Secondary | ICD-10-CM

## 2015-05-23 DIAGNOSIS — E1159 Type 2 diabetes mellitus with other circulatory complications: Secondary | ICD-10-CM

## 2015-05-23 DIAGNOSIS — E559 Vitamin D deficiency, unspecified: Secondary | ICD-10-CM | POA: Diagnosis not present

## 2015-05-23 DIAGNOSIS — Z7189 Other specified counseling: Secondary | ICD-10-CM

## 2015-05-23 DIAGNOSIS — IMO0002 Reserved for concepts with insufficient information to code with codable children: Secondary | ICD-10-CM

## 2015-05-23 DIAGNOSIS — E538 Deficiency of other specified B group vitamins: Secondary | ICD-10-CM | POA: Diagnosis not present

## 2015-05-23 DIAGNOSIS — R531 Weakness: Secondary | ICD-10-CM

## 2015-05-23 DIAGNOSIS — R159 Full incontinence of feces: Secondary | ICD-10-CM

## 2015-05-23 DIAGNOSIS — E669 Obesity, unspecified: Secondary | ICD-10-CM

## 2015-05-23 DIAGNOSIS — Z0000 Encounter for general adult medical examination without abnormal findings: Secondary | ICD-10-CM

## 2015-05-23 LAB — POCT GLYCOSYLATED HEMOGLOBIN (HGB A1C): Hemoglobin A1C: 6.7

## 2015-05-23 LAB — GLUCOSE, CAPILLARY: Glucose-Capillary: 106 mg/dL — ABNORMAL HIGH (ref 65–99)

## 2015-05-23 NOTE — Patient Instructions (Signed)
1. Stop the Victoza (liraglutide) 1.2 in the morning for two weeks 2. If no better off the med, I will call in an antibiotic. 3. See me in 4 weeks 4. If your sugar gets too low, please let me know 5. I will order the depends

## 2015-05-24 LAB — VITAMIN B12: Vitamin B-12: 148 pg/mL — ABNORMAL LOW (ref 211–946)

## 2015-05-24 LAB — CBC
Hematocrit: 34.7 % (ref 34.0–46.6)
Hemoglobin: 11.1 g/dL (ref 11.1–15.9)
MCH: 28.9 pg (ref 26.6–33.0)
MCHC: 32 g/dL (ref 31.5–35.7)
MCV: 90 fL (ref 79–97)
Platelets: 353 10*3/uL (ref 150–379)
RBC: 3.84 x10E6/uL (ref 3.77–5.28)
RDW: 15.2 % (ref 12.3–15.4)
WBC: 6.3 10*3/uL (ref 3.4–10.8)

## 2015-05-24 LAB — VITAMIN D 25 HYDROXY (VIT D DEFICIENCY, FRACTURES): Vit D, 25-Hydroxy: 16.6 ng/mL — ABNORMAL LOW (ref 30.0–100.0)

## 2015-05-24 NOTE — Progress Notes (Signed)
   Subjective:    Patient ID: [name] [name], female    DOB: December 29, 1945, 69 y.o.   MRN: [id number]  HPI  [name] [name] is here for DM F/U. Please see the A&P for the status of the pt's chronic medical problems.   Review of Systems  Constitutional: Positive for diaphoresis. Negative for unexpected weight change.       Overall, esp back, weakness  Respiratory: Positive for shortness of breath.   Gastrointestinal: Positive for diarrhea. Negative for abdominal pain, blood in stool and anal bleeding.       Fecal incontinence  Endocrine:       No hypoglycemia sxs  Genitourinary:       + urge incontinence  Musculoskeletal: Negative for arthralgias.  Psychiatric/Behavioral: Positive for sleep disturbance.       Objective:   Physical Exam  Constitutional: She appears well-developed and well-nourished. No distress.  HENT:  Head: Normocephalic and atraumatic.  Right Ear: External ear normal.  Left Ear: External ear normal.  Nose: Nose normal.  Eyes: Conjunctivae and EOM are normal.  Cardiovascular: Normal rate, regular rhythm and normal heart sounds.   Pulmonary/Chest: Effort normal and breath sounds normal.  Abdominal:  + and nl sensation peri-anally. Weak external spincter but able to contract. No stool in underwear nor leakage.   Neurological: She is alert.  Skin: Skin is warm and dry. She is not diaphoretic.  Psychiatric: She has a normal mood and affect. Her behavior is normal. Judgment and thought content normal.          Assessment & Plan:

## 2015-05-24 NOTE — Assessment & Plan Note (Signed)
Her Vit D was 36 12/2013 and she was supplemented with the high dose. Her level is now 16.6, lending some support to idea of malabsorption. Will Rx high dose at one month F/U.

## 2015-05-24 NOTE — Assessment & Plan Note (Signed)
Has not discussed with her daughters and I encouraged her to do so.

## 2015-05-24 NOTE — Assessment & Plan Note (Signed)
BP Readings from Last 3 Encounters:  05/23/15 127/53  02/11/15 134/61  01/17/15 119/57   BP is great on current regimen. Verified that she is just on one water pill. Cont atenolol, norvasc 5, lotensin 40, lasix 40, and Imdur 30.

## 2015-05-24 NOTE — Assessment & Plan Note (Addendum)
We were talking about her sleep and why she wasn't sleeping well and it discovered she is having fecal incontinence and had requested depends. This came on suddenly about 6-7 months ago. All BM's ar liquid and uncontrollable. She has fecal incontinence 4 times during waking hours and about twice at night, These results in her having to change clothes and clean up which is why she is requesting depends. She has no ABD and no blood. She had not lost weight during the past 7 months. Her med list indicates that Victoza might be the culprit - started June 2016 and then dose increased. UTD lists about 20% incidence of D and on line forums do show many pts complaining of uncontrollable D. Therefore, I have asked her to stop it for the next two weeks. 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 I am considering SBBO bc she is DM for long term and has low B12 and Vit D despite supplementation. If that doesn't work, I would consider referral to GI. She does have weak sphincter tone. She only has had 2 daughter so weak pelvic floor is possible but not high on diff. She could have autonomic dysfxn. She has had cervical and lumbar surgery but not recently. No ABD pain and no recent ABX. Doubt cauda equina as 7 months and no urinary incontinence.   1. Stop victoza 2. Replete B 12 and Vit D (possible consequence not cause) 3. If no better, rifaximin 4. If no better, GI

## 2015-05-24 NOTE — Assessment & Plan Note (Signed)
Lab Results  Component Value Date   HGBA1C 6.7 05/23/2015   This is down from 7.3. She is on lantus 20 and victoza 1.2 QAM. She checks her CBG TID religiously and no lows. The lowest was 89 and she felt OK. The highest was 169. She has not lost weight on the victoza. I am stopping it, see below,. And warned her that her CBG's might be a bit higher until we determine if the victoza was the cause of her sxs. Foot exam today. UTD on all other issues.

## 2015-05-24 NOTE — Assessment & Plan Note (Signed)
She has lost about 6 lbs since victoza was started which is not meaningful. Med being stopped as might be cause of SE.

## 2015-05-24 NOTE — Assessment & Plan Note (Signed)
Level is now back down to 148 from > 1000 (was checked minutes after a B 12 shot though). She had stated she had stopped taking the PO Vit B 12. She either has Pernicious anemia or is losing it 2/2 malabsoprtion. Will need IM since failed PO - either bc not absorbing it or forgets to take PO. Either way, will resume IM.

## 2015-05-27 ENCOUNTER — Other Ambulatory Visit: Payer: Medicare Other | Admitting: Internal Medicine

## 2015-05-27 DIAGNOSIS — R159 Full incontinence of feces: Secondary | ICD-10-CM

## 2015-05-29 ENCOUNTER — Other Ambulatory Visit: Payer: Self-pay | Admitting: Internal Medicine

## 2015-05-30 ENCOUNTER — Encounter: Payer: Self-pay | Admitting: Gastroenterology

## 2015-05-31 ENCOUNTER — Telehealth: Payer: Self-pay | Admitting: Internal Medicine

## 2015-05-31 MED ORDER — RIFAXIMIN 550 MG PO TABS: 550.0000 mg | ORAL_TABLET | Freq: Three times a day (TID) | ORAL | Status: DC

## 2015-05-31 NOTE — Telephone Encounter (Signed)
Pt has new cell# Pt states diarrhea is no better off the victoza She states she understands to pick up abx and take as directed She has not gotten her depends, i will speak w/ chilon about this

## 2015-05-31 NOTE — Telephone Encounter (Signed)
Spoke w/ chilonb. She has called the depends supplier once again today, i ask if we could speed them up, she is calling them again

## 2015-05-31 NOTE — Telephone Encounter (Signed)
[name] [name] contacted pt and no better. Trial of rifaximin.

## 2015-05-31 NOTE — Telephone Encounter (Signed)
Called pt to see if diarrhea better after stopping Victoza. Went to full voice mail and unable to leave message.

## 2015-05-31 NOTE — Telephone Encounter (Signed)
Made 2nd attempt at 12:58. No answer. Tried mobile number. No answer

## 2015-06-24 ENCOUNTER — Other Ambulatory Visit: Payer: Self-pay | Admitting: Internal Medicine

## 2015-06-24 DIAGNOSIS — Z1231 Encounter for screening mammogram for malignant neoplasm of breast: Secondary | ICD-10-CM

## 2015-06-26 ENCOUNTER — Other Ambulatory Visit: Payer: Self-pay | Admitting: Internal Medicine

## 2015-06-28 ENCOUNTER — Ambulatory Visit (HOSPITAL_COMMUNITY)
Admission: RE | Admit: 2015-06-28 | Discharge: 2015-06-28 | Disposition: A | Discharge status: Home / Self Care | Payer: Medicare Other | Source: Ambulatory Visit | Attending: Internal Medicine | Admitting: Internal Medicine

## 2015-06-28 DIAGNOSIS — Z1231 Encounter for screening mammogram for malignant neoplasm of breast: Secondary | ICD-10-CM | POA: Diagnosis present

## 2015-07-03 ENCOUNTER — Ambulatory Visit (INDEPENDENT_AMBULATORY_CARE_PROVIDER_SITE_OTHER): Payer: Medicare Other | Admitting: Cardiology

## 2015-07-03 ENCOUNTER — Encounter: Payer: Self-pay | Admitting: Cardiology

## 2015-07-03 VITALS — BP 118/70 | HR 75 | Ht 61.0 in | Wt 215.6 lb

## 2015-07-03 DIAGNOSIS — E785 Hyperlipidemia, unspecified: Secondary | ICD-10-CM | POA: Diagnosis not present

## 2015-07-03 DIAGNOSIS — I5032 Chronic diastolic (congestive) heart failure: Secondary | ICD-10-CM

## 2015-07-03 DIAGNOSIS — I10 Essential (primary) hypertension: Secondary | ICD-10-CM | POA: Diagnosis not present

## 2015-07-03 DIAGNOSIS — I2510 Atherosclerotic heart disease of native coronary artery without angina pectoris: Secondary | ICD-10-CM | POA: Diagnosis not present

## 2015-07-03 DIAGNOSIS — I2583 Coronary atherosclerosis due to lipid rich plaque: Principal | ICD-10-CM

## 2015-07-03 NOTE — Patient Instructions (Signed)
Medication Instructions:   Your physician recommends that you continue on your current medications as directed. Please refer to the Current Medication list given to you today.     Labwork:  6 MONTHS TO CHECK A CMET AND LIPIDS      Follow-Up:  6 MONTHS WITH DR Meda Coffee

## 2015-07-03 NOTE — Addendum Note (Signed)
Addended by: Nuala Alpha on: 07/03/2015 10:21 AM   Modules accepted: Orders

## 2015-07-03 NOTE — Progress Notes (Signed)
Patient ID: AWA BACHICHA, female   DOB: 03-14-1946, 69 y.o.   MRN: [id number]    Patient Name: [name] [name] Date of Encounter: 07/03/2015  Primary Care Provider:  Larey Dresser, MD Primary Cardiologist:  Dorothy Spark  Problem List   Past Medical History  Diagnosis Date  . DIABETES MELLITUS, TYPE II 08/26/2006    Insulin dependent. Victoza added 03/2014  . Chronic pain syndrome 01/03/2013    Multifactorial. Non opioid requiring.   L2-5 fusion, with hardware at L2-3, stable per MRI 2010. Followed by neurosurgery (Dr. 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    [name] DEPRESSION 08/26/2006    On paxil    . DYSLIPIDEMIA 11/01/2006    LDL goal < 100, 70 if can achieve without side effects.     . OBESITY 08/26/2006    BMI 39.5. Obesity Class 2.     . GERD 08/26/2006    Heartburn QHS.     [name] PVD (peripheral vascular disease) 03/27/2014    2015 LE arterial Duplex - Possible mild inflow disease on the left. 0-49% bilateral SFA disease, without focal stenosis. Three vesel run-off, bilaterally. Medical tx.   [name] CAD 08/26/2006    Cath 07/05:multiple areas of nonobstructive disease = medical & RF mgmt, well preserved global systolic function. Dr. Lia Foyer. Nuclear med stress test 01/2014 : Normal stress nuclear study. LV Ejection Fraction: 76%. LV Wall Motion: NL LV Function; NL Wall Motion.      [name] HYPERTENSION 08/26/2006    On BB, ACEI, lasix, norvasc, metolazone, and imdur.      Past Surgical History  Procedure Laterality Date  . Posterior lumbar interfusion surgery  07/18/07    L2-L3  . Lumbar fusion surgery  10/08    L3-L5  . Rotator cuff surgery    . Cholecystectomy    . Endometrial biospy     Allergies  No Known Allergies  HPI  This is a very nice patient who used to follow with Dr Lia Foyer for non-obstructive CAD, HTN and hyperlipidemia. Her cath in 2005 showed multiple areas of nonobstructive disease = medical  & RF mgmt, well preserved global systolic function. She has been doing well until recently when she developed DOE and pronounced fatigue. She has been very complait with her medications. She is minimally active as her prior knee surgery is limiting her, however able to do all the house chores. Moderate activity would give significant SOB that is progressively worsening. She is concerned about LE edema that has been present for the last two months. She has been taking lasix 40 mg po daily chronically. She is also experiencing claudications on short distances. No palpitations or syncope,  PND or orthopnea.   The patient underwent stress testing that was negative for scar or ischemia, normal left ventricular function and normal blood pressure. The patient states that her lower extremity edema has been significantly improved and so is her shortness of breath. She is compliant with her medications and is trying to walk but is limited because of her orthopedic issues. She is complaining of numbness in her left hand that started a few weeks ago.  06/12/2014 - 3 months follow up, improved LE edema with metolazone, no orthopnea, PND, chest pain or DOE. She is complaint with meds.  07/03/2015 - the patient is coming after 6 months, she is doing great, no CP, mild stable SOB, stable claudications, however the last LE arterial US was in  normal range in 12/2014. No orthopnea, or PND, no LE edema.   Home Medications  Prior to Admission medications   Medication Sig Start Date End Date Taking? Authorizing Provider  amLODipine (NORVASC) 5 MG tablet Take 1 tablet (5 mg total) by mouth daily. 04/25/13 04/25/14 Yes Bartholomew Crews, MD  aspirin 81 MG tablet Take 1 tablet (81 mg total) by mouth daily. 04/25/13  Yes Bartholomew Crews, MD  atenolol (TENORMIN) 100 MG tablet Take 0.5 tablets (50 mg total) by mouth daily. 04/25/13  Yes Bartholomew Crews, MD  atorvastatin (LIPITOR) 20 MG tablet take 1 tablet by mouth once daily  01/11/14  Yes Bartholomew Crews, MD  benazepril (LOTENSIN) 40 MG tablet take 1 tablet by mouth once daily 04/25/13  Yes Bartholomew Crews, MD  calcium carbonate (OS-CAL) 600 MG TABS Take 1 tablet (600 mg total) by mouth 2 (two) times daily with a meal. 04/25/13  Yes Bartholomew Crews, MD  cetaphil (CETAPHIL) cream Apply 1 application topically 2 (two) times daily as needed (pain).   Yes Historical Provider, MD  cholecalciferol (VITAMIN D) 1000 UNITS tablet Take 1 tablet (1,000 Units total) by mouth daily. 04/26/13  Yes Bartholomew Crews, MD  Cyanocobalamin 1000 MCG CAPS Take 1,000 mcg by mouth daily. 04/25/13  Yes Bartholomew Crews, MD  diclofenac sodium (VOLTAREN) 1 % GEL Apply 4 g topically 4 (four) times daily. Apply small amount to affected area up to 4 times a day. 04/25/13  Yes Bartholomew Crews, MD  ferrous sulfate 325 (65 FE) MG tablet Take 1 tablet (325 mg total) by mouth 2 (two) times daily with a meal. 04/25/13  Yes Bartholomew Crews, MD  furosemide (LASIX) 40 MG tablet Take 1 tablet (40 mg total) by mouth daily. 04/25/13  Yes Bartholomew Crews, MD  gabapentin (NEURONTIN) 400 MG capsule take 1 capsule by mouth three times a day 04/25/13  Yes Bartholomew Crews, MD  Insulin Glargine (LANTUS SOLOSTAR) 100 UNIT/ML SOPN Inject 20 Units into the skin at bedtime. 04/25/13  Yes Bartholomew Crews, MD  isosorbide mononitrate (IMDUR) 30 MG 24 hr tablet Take 1 tablet (30 mg total) by mouth daily. 04/25/13  Yes Bartholomew Crews, MD  loratadine (CLARITIN) 10 MG tablet Take 10 mg by mouth daily as needed for allergies.   Yes Historical Provider, MD  Magnesium Chloride (MAG-DELAY) 535 (64 MG) MG TBCR Take 1 tablet (64 mg total) by mouth 2 (two) times daily. 12/25/13  Yes Bartholomew Crews, MD  metFORMIN (GLUCOPHAGE) 1000 MG tablet Take 1 tablet (1,000 mg total) by mouth 2 (two) times daily with a meal. 04/25/13  Yes Bartholomew Crews, MD  nitroGLYCERIN (NITROSTAT) 0.4 MG SL tablet Place 1  tablet (0.4 mg total) under the tongue every 5 (five) minutes as needed for chest pain. 08/30/13  Yes Burnell Blanks, MD  PARoxetine (PAXIL) 40 MG tablet Take 1 tablet (40 mg total) by mouth every morning. 04/25/13  Yes Bartholomew Crews, MD  ranitidine (ZANTAC) 150 MG tablet Take 1 tablet (150 mg total) by mouth 2 (two) times daily. Try once a day in the evening. If not effective, increase to one pill twice a day 04/25/13 04/25/14 Yes Bartholomew Crews, MD  traMADol (ULTRAM) 50 MG tablet Take 1 tablet (50 mg total) by mouth 2 (two) times daily as needed. 01/01/14  Yes Carlos Levering Draper, DO  zolpidem (AMBIEN CR) 6.25 MG CR tablet Take 1 tablet (6.25 mg  total) by mouth at bedtime as needed for sleep. 04/25/13 12/21/13  Bartholomew Crews, MD    Family History  Family History  Problem Relation Age of Onset  . Hypertension Sister   . Hodgkin's lymphoma Sister     Social History  Social History   Social History  . Marital Status: Single    Spouse Name: N/A  . Number of Children: N/A  . Years of Education: N/A   Occupational History  . Not on file.   Social History Main Topics  . Smoking status: Never Smoker   . Smokeless tobacco: Never Used  . Alcohol Use: No  . Drug Use: No  . Sexual Activity: Not on file   Other Topics Concern  . 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 Has two daughters and several grand kids. Severe limitation in ambulation. Occ goes out to church but usually daughter gets groceries.      Review of Systems, as per HPI, otherwise negative General:  No chills, fever, night sweats or weight changes.  Cardiovascular:  No chest pain, dyspnea on exertion, edema, orthopnea, palpitations, paroxysmal nocturnal dyspnea. Dermatological: No rash, lesions/masses Respiratory: No cough, dyspnea Urologic: No hematuria, dysuria Abdominal:   No nausea, vomiting, diarrhea, bright red blood per rectum, melena, or  hematemesis Neurologic:  No visual changes, wkns, changes in mental status. All other systems reviewed and are otherwise negative except as noted above.  Physical Exam  Blood pressure 118/70, pulse 75, height 5\' 1"  (1.549 m), weight 215 lb 9.6 oz (97.796 kg), SpO2 98 %.  General: Pleasant, NAD Psych: Normal affect. Neuro: Alert and oriented X 3. Moves all extremities spontaneously. HEENT: Normal  Neck: Supple without bruits or JVD. Lungs:  Resp regular and unlabored, CTA. Heart: RRR no s3, s4, or murmurs. Abdomen: Soft, non-tender, non-distended, BS + x 4.  Extremities: No clubbing, cyanosis,  B/L LE edema up to the knees, week DP/PT pulses . Radials 2+ and equal bilaterally.  Labs:  No results for input(s): CKTOTAL, CKMB, TROPONINI in the last 72 hours. Lab Results  Component Value Date   WBC 6.3 05/23/2015   HGB 13.3 12/21/2013   HCT 34.7 05/23/2015   MCV 83.7 12/21/2013   PLT 303 12/21/2013    No results found for: DDIMER Invalid input(s): POCBNP    Component Value Date/Time   NA 142 10/18/2014 1043   K 4.5 10/18/2014 1043   CL 108 10/18/2014 1043   CO2 25 10/18/2014 1043   GLUCOSE 68* 10/18/2014 1043   BUN 25* 10/18/2014 1043   CREATININE 0.87 10/18/2014 1043   CREATININE 1.4* 06/12/2014 1124   CALCIUM 9.2 10/18/2014 1043   PROT 7.8 06/12/2014 1124   ALBUMIN 3.8 06/12/2014 1124   AST 20 06/12/2014 1124   ALT 15 06/12/2014 1124   ALKPHOS 69 06/12/2014 1124   BILITOT 0.8 06/12/2014 1124   GFRNONAA 68 10/18/2014 1043   GFRNONAA 88* 12/22/2013 0442   GFRAA 79 10/18/2014 1043   GFRAA >90 12/22/2013 0442   Lab Results  Component Value Date   CHOL 104 10/18/2014   HDL 46 10/18/2014   LDLCALC 47 10/18/2014   TRIG 56 10/18/2014    Accessory Clinical Findings  Echocardiogram - 2012 Left ventricle: The cavity size was normal. Wall thickness was normal. Systolic function was vigorous. The estimated ejection fraction was in the range of 65% to 70%. Wall motion  was normal; there were no regional  wall motion abnormalities. Doppler parameters are consistent with abnormal left ventricular relaxation (grade 1 diastolic dysfunction). - Aortic valve: Trivial regurgitation. - Atrial septum: No defect or patent foramen ovale was identified.  ECG - SR, normal ECG   Assessment & Plan  A very pleasant 69 year old female   1. Chronic diastolic CHF -  previously on metolazone, however her creatinine increased from 1.02 1.4 in September so metolazone was discontinued. Her repeat creatinine general 2016 was 0.8, the patient has stable weight and in fact has lost a pound currently 212 pounds and appears euvolemic.   2. CAD - h/o non-obstructive CAD by cath in 2005, normal stress test in May 2015.  3. Claudications - LE arterial Duplex - Possible mild inflow disease on the left. 0-49% bilateral SFA disease, without focal stenosis. Three vesel run-off, bilaterally. Repeat B/L arterial US was in normal range.  4. Hyperlipidemia - continue statin, all lipids at goal in January 2016.  Follow up in 6 months with CMP and lipids prior to the visit.  Dorothy Spark, MD, [facility] 07/03/2015, 9:55 AM

## 2015-07-04 ENCOUNTER — Ambulatory Visit (INDEPENDENT_AMBULATORY_CARE_PROVIDER_SITE_OTHER): Payer: Medicare Other | Admitting: Internal Medicine

## 2015-07-04 ENCOUNTER — Encounter: Payer: Self-pay | Admitting: Internal Medicine

## 2015-07-04 ENCOUNTER — Telehealth: Payer: Self-pay | Admitting: *Deleted

## 2015-07-04 VITALS — BP 132/69 | HR 78 | Temp 98.0°F | Wt 216.2 lb

## 2015-07-04 DIAGNOSIS — R159 Full incontinence of feces: Secondary | ICD-10-CM | POA: Diagnosis not present

## 2015-07-04 DIAGNOSIS — E1151 Type 2 diabetes mellitus with diabetic peripheral angiopathy without gangrene: Secondary | ICD-10-CM

## 2015-07-04 DIAGNOSIS — E1165 Type 2 diabetes mellitus with hyperglycemia: Secondary | ICD-10-CM | POA: Diagnosis not present

## 2015-07-04 DIAGNOSIS — Z23 Encounter for immunization: Secondary | ICD-10-CM | POA: Diagnosis not present

## 2015-07-04 DIAGNOSIS — Z7189 Other specified counseling: Secondary | ICD-10-CM

## 2015-07-04 DIAGNOSIS — E538 Deficiency of other specified B group vitamins: Secondary | ICD-10-CM

## 2015-07-04 DIAGNOSIS — Z0000 Encounter for general adult medical examination without abnormal findings: Secondary | ICD-10-CM

## 2015-07-04 DIAGNOSIS — IMO0002 Reserved for concepts with insufficient information to code with codable children: Secondary | ICD-10-CM

## 2015-07-04 DIAGNOSIS — L853 Xerosis cutis: Secondary | ICD-10-CM

## 2015-07-04 MED ORDER — CYANOCOBALAMIN 1000 MCG/ML IJ SOLN
1000.0000 ug | Freq: Once | INTRAMUSCULAR | Status: AC
  Administered 2015-07-04: 1000 ug via INTRAMUSCULAR

## 2015-07-04 NOTE — Progress Notes (Signed)
   Subjective:    Patient ID: [name] [name], female    DOB: 23-Apr-1946, 69 y.o.   MRN: [id number]  HPI  [name] [name] is here for fecal incontinence F/U. Please see the A&P for the status of the pt's chronic medical problems.   Review of Systems  Constitutional: Negative for appetite change and unexpected weight change.  Gastrointestinal: Positive for diarrhea. Negative for abdominal pain and constipation.       Objective:   Physical Exam  Constitutional: She appears well-developed and well-nourished. No distress.  HENT:  Head: Normocephalic and atraumatic.  Right Ear: External ear normal.  Left Ear: External ear normal.  Nose: Nose normal.  Eyes: Conjunctivae and EOM are normal.  Pulmonary/Chest: Effort normal.  Neurological: She is alert.  Skin: Skin is warm and dry. She is not diaphoretic.  Psychiatric: She has a normal mood and affect. Her behavior is normal. Judgment and thought content normal.          Assessment & Plan:

## 2015-07-04 NOTE — Assessment & Plan Note (Signed)
Since D no better off Victoza, will resume. CBG's not too bad off of it though. Microalb today. Flu shot today

## 2015-07-04 NOTE — Patient Instructions (Signed)
1. See me end of Nov 2. Helen called [facility] to get extra large diapers 3. Stool sample for an infection called C diff 4. We are looking into the antibiotic 5. You got your B 12 shot tday

## 2015-07-04 NOTE — Assessment & Plan Note (Signed)
Her daughter came with her and they had discussed and now had Advanced directives filled out. To be notarized and scanned in. She wants everything, even if MD's do not feel she will recover, had advanced dementia, etc. She also wants feeding tube.

## 2015-07-04 NOTE — Assessment & Plan Note (Signed)
IM B12 today. 

## 2015-07-04 NOTE — Assessment & Plan Note (Addendum)
She continues to have fecal incontinence. 2-3 times per night - not even aware it is happening until too late. 4 times during day - incontinent if she doesn't get to toilet quick enough. Stool never solid. Liquid / watery. She has urgent BM 2-3 min after eating.   She stopped her victoza but no better so will restart the med. She never got the rifaximin - Ulis Rias is checking on why. That is for empiric tx of SBBO. Even though no ABD pain or ABX, I elected to send off C diff as I am sure it is in the community.  XXL depends order by [facility].  If no better, will send to GI.    C/O itching on inside of L lateral knee. No rash. Dry skin. Tiny, 2 mm excoriated lesion. One similair area on ABD. No other itching. Moisturize skin with OTC lotion.

## 2015-07-04 NOTE — Telephone Encounter (Signed)
Dr Software engineer states pt needs larger pullups, called [facility] and [facility], [facility] rep states she has been getting XL so far so they will move up to XXL and check with pt to make sure they work

## 2015-07-07 ENCOUNTER — Other Ambulatory Visit: Payer: Self-pay | Admitting: Cardiology

## 2015-07-09 ENCOUNTER — Telehealth: Payer: Self-pay | Admitting: *Deleted

## 2015-07-09 ENCOUNTER — Other Ambulatory Visit: Payer: Self-pay | Admitting: Internal Medicine

## 2015-07-09 ENCOUNTER — Ambulatory Visit: Payer: Medicare Other | Attending: Internal Medicine | Admitting: Physical Therapy

## 2015-07-09 ENCOUNTER — Encounter: Payer: Self-pay | Admitting: Physical Therapy

## 2015-07-09 DIAGNOSIS — R269 Unspecified abnormalities of gait and mobility: Secondary | ICD-10-CM

## 2015-07-09 DIAGNOSIS — R2681 Unsteadiness on feet: Secondary | ICD-10-CM | POA: Insufficient documentation

## 2015-07-09 DIAGNOSIS — R531 Weakness: Secondary | ICD-10-CM | POA: Diagnosis present

## 2015-07-09 NOTE — Therapy (Signed)
[facility] [address] [place], [place], [address] Phone: [phone]   Fax:  [phone]  Physical Therapy Evaluation  Patient Details  Name: [name] [name] MRN: [id number] Date of Birth: [birth year] Referring Provider:  Bartholomew Crews, MD  Encounter Date: 07/09/2015      PT End of Session - 07/09/15 1555    Visit Number 1   Number of Visits 17  eval + 16 visits   Date for PT Re-Evaluation 09/07/15   Authorization Type UHC Medicare   PT Start Time 1058   PT Stop Time 1145   PT Time Calculation (min) 47 min   Activity Tolerance Patient limited by fatigue   Behavior During Therapy [facility] for tasks assessed/performed      Past Medical History  Diagnosis Date  . DIABETES MELLITUS, TYPE II 08/26/2006    Insulin dependent. Victoza added 03/2014  . Chronic pain syndrome 01/03/2013    Multifactorial. Non opioid requiring.   L2-5 fusion, with hardware at L2-3, stable per MRI 2010. Followed by neurosurgery (Dr. 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    [name] DEPRESSION 08/26/2006    On paxil    . DYSLIPIDEMIA 11/01/2006    LDL goal < 100, 70 if can achieve without side effects.     . OBESITY 08/26/2006    BMI 39.5. Obesity Class 2.     . GERD 08/26/2006    Heartburn QHS.     [name] PVD (peripheral vascular disease) 03/27/2014    2015 LE arterial Duplex - Possible mild inflow disease on the left. 0-49% bilateral SFA disease, without focal stenosis. Three vesel run-off, bilaterally. Medical tx.   [name] CAD 08/26/2006    Cath 07/05:multiple areas of nonobstructive disease = medical & RF mgmt, well preserved global systolic function. Dr. Lia Foyer. Nuclear med stress test 01/2014 : Normal stress nuclear study. LV Ejection Fraction: 76%. LV Wall Motion: NL LV Function; NL Wall Motion.      [name] HYPERTENSION 08/26/2006    On BB, ACEI, lasix, norvasc, metolazone, and imdur.        Past Surgical History  Procedure Laterality Date  . Posterior lumbar interfusion surgery  07/18/07    L2-L3  . Lumbar fusion surgery  10/08    L3-L5  . Rotator cuff surgery    . Cholecystectomy    . Endometrial biospy      There were no vitals filed for this visit.  Visit Diagnosis:  Weakness generalized - Plan: PT plan of care cert/re-cert  Abnormality of gait - Plan: PT plan of care cert/re-cert  Unsteadiness - Plan: PT plan of care cert/re-cert      Subjective Assessment - 07/09/15 1106    Subjective "I can't do no standing and I can't do no walking. My back is weak and it's painful." Pt reports pain in lower back since spinal surgery in 2003. Pain is shooting in nature and radiates from R lumbar spine to right foot. Pt reports balance is "not that good." Last fall was in March 2016.   Patient is accompained by: Family member  Daughter, Doris   Pertinent History * Pt unable to read. Daughter, [name], able to help with some basic reading. PMH: non-obstructive CAD, HTN, HLD, chronic diastolic CHF, LE claudication, DM 2, chronic back in lower back   Limitations Sitting;Lifting;Standing;Walking;House hold activities   Patient Stated Goals "Be able to stand and do some walking. I want  to be able to go to the grocery store."   Currently in Pain? Yes   Pain Score 6    Pain Location Back   Pain Orientation Right   Pain Descriptors / Indicators Shooting   Pain Type Chronic pain   Pain Radiating Towards down legs and into right foot   Pain Onset More than a month ago   Pain Frequency Intermittent   Aggravating Factors  "When I'm standing and doing something."   Pain Relieving Factors "Sometimes sitting or laying down makes it better and sometimes it don't."   Effect of Pain on Daily Activities Limits tolerance to walking; unable to perform chores, grocery shopping.   Multiple Pain Sites No            [facility] PT Assessment - 07/09/15 0001    Assessment   Medical Diagnosis  proximal LE weakness   Onset Date/Surgical Date 10/12/08   Hand Dominance Right   Precautions   Precautions Fall   Restrictions   Weight Bearing Restrictions No   Balance Screen   Has the patient fallen in the past 6 months Yes   How many times? 1   Has the patient had a decrease in activity level because of a fear of falling?  Yes   Is the patient reluctant to leave their home because of a fear of falling?  Yes   [place] Private residence   Living Arrangements Alone;Spouse/significant other  with a female friend   Type of [place] to enter   Entrance Stairs-Number of Steps [place] One level   [place] - single point;Shower seat;Toilet riser   Prior Function   Level of Independence Requires assistive device for independence   Vocation Retired   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   Leisure go to [employer] Impaired Detail   Additional Comments Impaired sensation distal to R knee   Coordination   Heel Shin Test Pt able to get into position with increased time/effort; no excursion of movement on B LE's due to pain   Posture/Postural Control   Posture/Postural Control Postural limitations   Postural Limitations Rounded Shoulders;Forward head;Flexed trunk   Posture Comments Lateral trunk shift to R side   ROM / Strength   AROM / PROM / Strength Strength   Strength   Overall Strength Deficits   Overall Strength Comments B hip flexion/extension 4-/5;    Special Tests    Special Tests Lumbar   Lumbar Tests Slump Test   Slump test   Findings Positive   Comment (+) in BLE;s   Bed Mobility   Bed Mobility Supine to Sit;Sit to Supine   Supine to Sit 6: Modified independent (Device/Increase time)   Sit to Supine 6: Modified independent (Device/Increase time)   Transfers   Transfers Stand to Sit;Sit to Stand   Sit to  Stand 6: Modified independent (Device/Increase time)   Five time sit to stand comments  18.32 sec  without UE use   Stand to Sit 6: Modified independent (Device/Increase time)   Ambulation/Gait   Ambulation/Gait Yes   Ambulation/Gait Assistance 5: Supervision   Ambulation Distance (Feet) 423 Feet  6MWT   Assistive device Straight cane   Gait Pattern Step-through pattern;Decreased stride length;Decreased hip/knee flexion - right;Decreased hip/knee flexion - left;Decreased dorsiflexion - left;Left foot flat;Lateral  trunk lean to right   Ambulation Surface Level;Indoor   Gait velocity 1.69 ft/sec   6 Minute walk- Post Test   6 Minute Walk Post Test yes   BP (mmHg) 123/66 mmHg   HR (bpm) 89   Modified Borg Scale for Dyspnea 6-   Perceived Rate of Exertion (Borg) 20-   6 minute walk test results    Aerobic Endurance Distance Walked 423   Balance   Balance Assessed Yes   Static Standing Balance   Static Standing - Balance Support No upper extremity supported   Static Standing - Level of Assistance 5: Stand by assistance;4: Min assist   Static Standing Balance -  Activities  Romberg - Eyes Opened;Romberg - Eyes Closed   Static Standing - Comment/# of Minutes Static standing, wide BOS with EC requiring supervision to min guard due to significant A/P sway                           PT Education - 07/09/15 1303    Education provided Yes   Education Details Pt eval findings, goals, and POC.   Person(s) Educated Patient;Child(ren)   Methods Explanation   Comprehension Verbalized understanding          PT Short Term Goals - 07/09/15 1428    PT SHORT TERM GOAL #1   Title Pt will perform home exercises with mod I using paper handout to indicate safe HEP compliance and to maximize functional gains made in PT. Target date: 08/06/15   Baseline Pt completely dependent for HEP.   PT SHORT TERM GOAL #2   Title Pt will increase 6MWT distance from 40' to 498' to indicate  progress toward improved functional activity tolerance.  Target date: 08/06/15   Baseline 6MWT: 38' with SPC with seated rest break from 1:45 to 3:40.   PT SHORT TERM GOAL #3   Title Pt will increase self-selected gait speed from 1.69 ft/sec to 1.99 ft/sec to indicate decreased risk of recurrent falls.  Target date: 08/06/15   Baseline 1.69 ft/sec           PT Long Term Goals - 07/09/15 1431    PT LONG TERM GOAL #1   Title Pt will increase 6MWT distance from 423' to 573' to indicate significant improvement in functional endurance. Target date: 09/03/15   Baseline 423' with SPC and seated rest break from 1:45 to 3:40   PT LONG TERM GOAL #2   Title Pt will increase self-selected gait speed from 1.69 ft/sec to 2.29 ft/sec to indicate status of limited community ambulator. Target date: 09/03/15   Baseline 1.69 ft/sec   PT LONG TERM GOAL #3   Title Pt will decrease 5x Sit To Stand time from 18.32 sec to </= 12.6 sec without UE use to indicate decreased fall risk, improved functional LE strength. Target date: 09/03/15   Baseline 18.32 sec without UE use   PT LONG TERM GOAL #4   Title Pt will ambulate x500' over unlevel, paved surfaces with mod I using LRAD to indicate increased safety with limited community mobility. Target date: 09/03/15   Baseline Pt unable to ambulate >263' consecutively without seated rest break and supervision for safety.   PT LONG TERM GOAL #5   Title Pt will negotiate standard ramp and curb step with Mod I using LRAD to indicate increased safety traversing community obstacles. Target date: 09/03/15   Baseline Requires min guard of daughter for curb step negotiation  Additional Long Term Goals   Additional Long Term Goals Yes   PT LONG TERM GOAL #6   Title Pt will perform floor transfer with mod I using standard chair to increase safety with fall recovery. Target date: 09/03/15   Baseline After fall in 12/2014, pt requires assist of family members to safely get up  from floor.               Plan - 07-29-15 1310    Clinical Impression Statement Pt is a 69 y/o F referred to outpatient PT to address functional impairments associated with proximal LE weakness. PT evaluation reveals the following impairments: weakness in B hips; impaired LE sensation; impaired balance; self-selected gait speed indicative of risk of recurrent falls; 5x sit-to-stand test time suggestive of decreased functional LE strength; and 6 minute wak test distance indicative of significant impairments in functional endurance. Pt will benefit from skilled outpatient PT 2x/week for 8 weeks to address said impairments.    Pt will benefit from skilled therapeutic intervention in order to improve on the following deficits Abnormal gait;Decreased coordination;Decreased activity tolerance;Decreased endurance;Decreased balance;Decreased mobility;Decreased strength;Impaired sensation;Postural dysfunction;Pain;Other (comment)  Pain will not be directly addressed in PT due to nature of pain and unable to change pain with activity/movement   Rehab Potential Good   Clinical Impairments Affecting Rehab Potential chronic pain   PT Frequency 2x / week   PT Duration 8 weeks   PT Treatment/Interventions ADLs/Self Care Home Management;Vestibular;Functional mobility training;Stair training;Gait training;DME Instruction;Therapeutic activities;Therapeutic exercise;Balance training;Neuromuscular re-education;Patient/family education;Orthotic Fit/Training   PT Next Visit Plan initiate HEP with focus on functional LE strengthening; assess outdoor mobility   Consulted and Agree with Plan of Care Patient;Family member/caregiver   Family Member Consulted daughter, Leonie Man - July 29, 2015 1426    Functional Assessment Tool Used 6MWT: 49' with SPC   Functional Limitation Mobility: Walking and moving around   Mobility: Walking and Moving Around Current Status [phone]) At least 40 percent but less than  60 percent impaired, limited or restricted   Mobility: Walking and Moving Around Goal Status [phone]) At least 20 percent but less than 40 percent impaired, limited or restricted       Problem List Patient Active Problem List   Diagnosis Date Noted  . Goals of care, counseling/discussion 01/18/2015  . ASCUS with positive high risk HPV 08/03/2014  . PVD (peripheral vascular disease) 03/27/2014  . Diastolic CHF, chronic 10/20/3233  . Healthcare maintenance 04/25/2013  . Chronic pain syndrome 01/03/2013  . Insomnia 12/22/2011  . Vitamin B12 deficiency 12/27/2009  . Vitamin D deficiency 12/27/2009  . Anemia 12/27/2009  . Hypomagnesemia 12/29/2008  . OSTEOPENIA 08/01/2008  . Hyperlipidemia 11/01/2006  . GLAUCOMA NOS 11/01/2006  . DM (diabetes mellitus), type 2, uncontrolled, periph vascular complic 57/32/2025  . Obesity 08/26/2006  . Depression 08/26/2006  . HTN (hypertension) 08/26/2006  . Coronary atherosclerosis 08/26/2006  . GERD 08/26/2006    Billie Ruddy, PT, DPT [facility] [address] [place], [place], [address] Phone: [phone]   Fax:  [phone] 07/29/2015, 4:07 PM

## 2015-07-09 NOTE — Telephone Encounter (Signed)
PA completed online via Cover My Meds on 07/05/15-received faxed denial from pt's insurance company.  Discussed with pt's pcp-decision made to change patient to another med, phone call complete.[name]9/27/20163:41 PM

## 2015-07-10 ENCOUNTER — Other Ambulatory Visit: Payer: Self-pay | Admitting: Internal Medicine

## 2015-07-10 ENCOUNTER — Other Ambulatory Visit: Payer: Medicare Other

## 2015-07-10 DIAGNOSIS — R159 Full incontinence of feces: Secondary | ICD-10-CM

## 2015-07-10 MED ORDER — METRONIDAZOLE 500 MG PO TABS: 500.0000 mg | ORAL_TABLET | Freq: Three times a day (TID) | ORAL | Status: DC

## 2015-07-10 NOTE — Telephone Encounter (Signed)
Would you pls tell her that her insurance would not pay for the antibiotic that I thought was best for her. I sent in metronidazole. No alcohol at all while on med. Will leave a bad taste in mouth - keep mints / hard candy nearby if needed. The antiobiotic is for the diarrhea. Pls ask her to call after done with the med and let me know if it helped.

## 2015-07-12 LAB — CLOSTRIDIUM DIFFICILE EIA: C difficile Toxins A+B, EIA: NEGATIVE

## 2015-07-16 ENCOUNTER — Ambulatory Visit: Payer: Medicare Other | Attending: Internal Medicine | Admitting: Physical Therapy

## 2015-07-16 ENCOUNTER — Encounter: Payer: Self-pay | Admitting: Physical Therapy

## 2015-07-16 DIAGNOSIS — R2681 Unsteadiness on feet: Secondary | ICD-10-CM | POA: Diagnosis present

## 2015-07-16 DIAGNOSIS — R531 Weakness: Secondary | ICD-10-CM | POA: Diagnosis present

## 2015-07-16 DIAGNOSIS — R269 Unspecified abnormalities of gait and mobility: Secondary | ICD-10-CM | POA: Diagnosis present

## 2015-07-16 NOTE — Patient Instructions (Signed)
BRIDGING  While lying on your back, tighten your lower abdominals, squeeze your buttocks and then raise your buttocks off the floor/bed as creating a "Bridge" with your body.  Hold for 5 seconds, 10 reps, 1-2 times a day.     STRAIGHT LEG RAISE - SLR  While lying with a straight knee. Keep the opposite knee bent with the foot planted to the ground. Raise straight leg up, keeping knee straight, toes pointed at ceiling. Hold for 5 seconds, 10 reps. 1-2 times a day. Perform with each leg.   SUPINE HIP ABDUCTION - ELASTIC BAND CLAMS  Lie down on your back with your knees bent. Place an elastic band around your knees and then draw your knees apart.   SIT TO STAND - NO HANDS  Start by sitting in a chair/edge of the sofa/bed. Next, raise up to standing without using your hands for support. Repeat 10 reps, 1-2 times a day.   STANDING HEEL RAISES  While standing, raise up on your toes as you lift your heels off the ground. Hold onto sturdy object for balance (chair or counter top). 10 reps, 1-2 times day.    TOES RAISES - DORSIFLEXION STANDING  In a standing position with your feet on the ground, raise up your forefoot and toes as you bend at your ankle. hold for 5 seconds, 10 reps. 1-2 times a day.      Hip Abduction  Standing tall, lift one leg out to the side then return. Keep toes pointed forward. Alternate legs. Hold for 5 seconds. 10 reps each side, 1-2 times a day.   HIP EXTENSION - STANDING  While standing, move your leg back as shown. Keep knee as straight as possible and do not lean forward. Alternate legs, 10 reps each side, 1-2 times a day.  Use your arms for support if needed for balance and safety

## 2015-07-16 NOTE — Therapy (Signed)
[facility] [address] [place], [place], [address] Phone: [phone]   Fax:  [phone]  Physical Therapy Treatment  Patient Details  Name: [name] [name] MRN: [id number] Date of Birth: July 28, 1946 Referring Provider:  Bartholomew Crews, MD  Encounter Date: 07/16/2015      PT End of Session - 07/16/15 0810    Visit Number 2   Number of Visits 17  eval + 16 visits   Date for PT Re-Evaluation 09/07/15   Authorization Type UHC Medicare   PT Start Time 0806   PT Stop Time 0845   PT Time Calculation (min) 39 min   Activity Tolerance Patient limited by fatigue   Behavior During Therapy [facility] for tasks assessed/performed      Past Medical History  Diagnosis Date  . DIABETES MELLITUS, TYPE II 08/26/2006    Insulin dependent. Victoza added 03/2014  . Chronic pain syndrome 01/03/2013    Multifactorial. Non opioid requiring.   L2-5 fusion, with hardware at L2-3, stable per MRI 2010. Followed by neurosurgery (Dr. 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    [name] DEPRESSION 08/26/2006    On paxil    . DYSLIPIDEMIA 11/01/2006    LDL goal < 100, 70 if can achieve without side effects.     . OBESITY 08/26/2006    BMI 39.5. Obesity Class 2.     . GERD 08/26/2006    Heartburn QHS.     [name] PVD (peripheral vascular disease) ([place]) 03/27/2014    2015 LE arterial Duplex - Possible mild inflow disease on the left. 0-49% bilateral SFA disease, without focal stenosis. Three vesel run-off, bilaterally. Medical tx.   [name] CAD 08/26/2006    Cath 07/05:multiple areas of nonobstructive disease = medical & RF mgmt, well preserved global systolic function. Dr. Lia Foyer. Nuclear med stress test 01/2014 : Normal stress nuclear study. LV Ejection Fraction: 76%. LV Wall Motion: NL LV Function; NL Wall Motion.      [name] HYPERTENSION 08/26/2006    On BB, ACEI, lasix, norvasc, metolazone, and  imdur.       Past Surgical History  Procedure Laterality Date  . Posterior lumbar interfusion surgery  07/18/07    L2-L3  . Lumbar fusion surgery  10/08    L3-L5  . Rotator cuff surgery    . Cholecystectomy    . Endometrial biospy      There were no vitals filed for this visit.  Visit Diagnosis:  Weakness generalized  Abnormality of gait  Unsteadiness      Subjective Assessment - 07/16/15 0809    Subjective No new complaints. Still with lower leg/feet pain. No falls to report.   Currently in Pain? Yes   Pain Score 7    Pain Location Leg   Pain Orientation Right;Left   Pain Descriptors / Indicators Sharp;Pins and needles   Pain Type Chronic pain   Pain Onset More than a month ago   Pain Frequency Intermittent   Pain Relieving Factors walking   Effect of Pain on Daily Activities sitting, medicaiton     Treatment: Educated on exercises for HEP for lower extremity strengthening. Refer to pt instructions for full details.       PT Education - 07/16/15 0840    Education provided Yes   Education Details HEP: bridge, straight leg raises, hip abdcution/ER with red band, sit<>stand, heel raises, toe raises, hip abdcution and hip extension.  Person(s) Educated Patient   Methods Explanation;Demonstration;Handout   Comprehension Verbal cues required;Verbalized understanding          PT Short Term Goals - 07/09/15 1428    PT SHORT TERM GOAL #1   Title Pt will perform home exercises with mod I using paper handout to indicate safe HEP compliance and to maximize functional gains made in PT. Target date: 08/06/15   Baseline Pt completely dependent for HEP.   PT SHORT TERM GOAL #2   Title Pt will increase 6MWT distance from 49' to 498' to indicate progress toward improved functional activity tolerance.  Target date: 08/06/15   Baseline 6MWT: 78' with SPC with seated rest break from 1:45 to 3:40.   PT SHORT TERM GOAL #3   Title Pt will increase self-selected gait speed  from 1.69 ft/sec to 1.99 ft/sec to indicate decreased risk of recurrent falls.  Target date: 08/06/15   Baseline 1.69 ft/sec           PT Long Term Goals - 07/09/15 1431    PT LONG TERM GOAL #1   Title Pt will increase 6MWT distance from 423' to 573' to indicate significant improvement in functional endurance. Target date: 09/03/15   Baseline 423' with SPC and seated rest break from 1:45 to 3:40   PT LONG TERM GOAL #2   Title Pt will increase self-selected gait speed from 1.69 ft/sec to 2.29 ft/sec to indicate status of limited community ambulator. Target date: 09/03/15   Baseline 1.69 ft/sec   PT LONG TERM GOAL #3   Title Pt will decrease 5x Sit To Stand time from 18.32 sec to </= 12.6 sec without UE use to indicate decreased fall risk, improved functional LE strength. Target date: 09/03/15   Baseline 18.32 sec without UE use   PT LONG TERM GOAL #4   Title Pt will ambulate x500' over unlevel, paved surfaces with mod I using LRAD to indicate increased safety with limited community mobility. Target date: 09/03/15   Baseline Pt unable to ambulate >263' consecutively without seated rest break and supervision for safety.   PT LONG TERM GOAL #5   Title Pt will negotiate standard ramp and curb step with Mod I using LRAD to indicate increased safety traversing community obstacles. Target date: 09/03/15   Baseline Requires min guard of daughter for curb step negotiation   Additional Long Term Goals   Additional Long Term Goals Yes   PT LONG TERM GOAL #6   Title Pt will perform floor transfer with mod I using standard chair to increase safety with fall recovery. Target date: 09/03/15   Baseline After fall in 12/2014, pt requires assist of family members to safely get up from floor.            Plan - 07/16/15 0810    Clinical Impression Statement Edcuated on and issued HEP for lower extremity strengthening today without any issues. Pt making steady progress toward goals.   Pt will benefit  from skilled therapeutic intervention in order to improve on the following deficits Abnormal gait;Decreased coordination;Decreased activity tolerance;Decreased endurance;Decreased balance;Decreased mobility;Decreased strength;Impaired sensation;Postural dysfunction;Pain;Other (comment)  Pain will not be directly addressed in PT due to nature of pain and unable to change pain with activity/movement   Rehab Potential Good   Clinical Impairments Affecting Rehab Potential chronic pain   PT Frequency 2x / week   PT Duration 8 weeks   PT Treatment/Interventions ADLs/Self Care Home Management;Vestibular;Functional mobility training;Stair training;Gait training;DME Instruction;Therapeutic activities;Therapeutic exercise;Balance training;Neuromuscular re-education;Patient/family  education;Orthotic Fit/Training   PT Next Visit Plan Continue to work on strengthening and balance   Consulted and Agree with Plan of Care Patient;Family member/caregiver   Family Member Consulted daughter, [name]        Problem List Patient Active Problem List   Diagnosis Date Noted  . Goals of care, counseling/discussion 01/18/2015  . ASCUS with positive high risk HPV 08/03/2014  . PVD (peripheral vascular disease) ([place]) 03/27/2014  . Diastolic CHF, chronic ([place]) 12/21/2013  . Healthcare maintenance 04/25/2013  . Chronic pain syndrome 01/03/2013  . Insomnia 12/22/2011  . Vitamin B12 deficiency 12/27/2009  . Vitamin D deficiency 12/27/2009  . Anemia 12/27/2009  . Hypomagnesemia 12/29/2008  . OSTEOPENIA 08/01/2008  . Hyperlipidemia 11/01/2006  . GLAUCOMA NOS 11/01/2006  . DM (diabetes mellitus), type 2, uncontrolled, periph vascular complic ([place]) 96/78/9381  . Obesity 08/26/2006  . Depression 08/26/2006  . HTN (hypertension) 08/26/2006  . Coronary atherosclerosis 08/26/2006  . GERD 08/26/2006    Willow Ora 07/16/2015, 8:49 AM  Willow Ora, PTA, [facility] [address] [place], [place] [address] [phone] 07/16/2015, 8:49 AM

## 2015-07-18 ENCOUNTER — Ambulatory Visit: Payer: Medicare Other | Admitting: Physical Therapy

## 2015-07-18 ENCOUNTER — Encounter: Payer: Self-pay | Admitting: Physical Therapy

## 2015-07-18 DIAGNOSIS — R2681 Unsteadiness on feet: Secondary | ICD-10-CM

## 2015-07-18 DIAGNOSIS — R269 Unspecified abnormalities of gait and mobility: Secondary | ICD-10-CM

## 2015-07-18 DIAGNOSIS — R531 Weakness: Secondary | ICD-10-CM

## 2015-07-18 NOTE — Therapy (Signed)
[facility] [address] [place], [place], [address] Phone: [phone]   Fax:  [phone]  Physical Therapy Treatment  Patient Details  Name: [name] [name] MRN: [id number] Date of Birth: 1946/08/04 Referring Provider:  Bartholomew Crews, MD  Encounter Date: 07/18/2015      PT End of Session - 07/18/15 0807    Visit Number 3   Number of Visits 17  eval + 16 visits   Date for PT Re-Evaluation 09/07/15   Authorization Type UHC Medicare   PT Start Time 0802   PT Stop Time 0845   PT Time Calculation (min) 43 min   Activity Tolerance Patient limited by fatigue   Behavior During Therapy [facility] for tasks assessed/performed      Past Medical History  Diagnosis Date  . DIABETES MELLITUS, TYPE II 08/26/2006    Insulin dependent. Victoza added 03/2014  . Chronic pain syndrome 01/03/2013    Multifactorial. Non opioid requiring.   L2-5 fusion, with hardware at L2-3, stable per MRI 2010. Followed by neurosurgery (Dr. 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    [name] DEPRESSION 08/26/2006    On paxil    . DYSLIPIDEMIA 11/01/2006    LDL goal < 100, 70 if can achieve without side effects.     . OBESITY 08/26/2006    BMI 39.5. Obesity Class 2.     . GERD 08/26/2006    Heartburn QHS.     [name] PVD (peripheral vascular disease) ([place]) 03/27/2014    2015 LE arterial Duplex - Possible mild inflow disease on the left. 0-49% bilateral SFA disease, without focal stenosis. Three vesel run-off, bilaterally. Medical tx.   [name] CAD 08/26/2006    Cath 07/05:multiple areas of nonobstructive disease = medical & RF mgmt, well preserved global systolic function. Dr. Lia Foyer. Nuclear med stress test 01/2014 : Normal stress nuclear study. LV Ejection Fraction: 76%. LV Wall Motion: NL LV Function; NL Wall Motion.      [name] HYPERTENSION 08/26/2006    On BB, ACEI, lasix, norvasc, metolazone, and  imdur.       Past Surgical History  Procedure Laterality Date  . Posterior lumbar interfusion surgery  07/18/07    L2-L3  . Lumbar fusion surgery  10/08    L3-L5  . Rotator cuff surgery    . Cholecystectomy    . Endometrial biospy      There were no vitals filed for this visit.  Visit Diagnosis:  Weakness generalized  Abnormality of gait  Unsteadiness      Subjective Assessment - 07/18/15 0806    Subjective No falls. Reports feeling run down today, like a cold is coming on. No changes in lower extremity pain.   Currently in Pain? Yes   Pain Score 6    Pain Location Leg   Pain Orientation Right;Left   Pain Descriptors / Indicators Pins and needles;Sharp   Pain Type Chronic pain   Pain Onset More than a month ago   Pain Frequency Intermittent   Aggravating Factors  increased activity, standing   Pain Relieving Factors sitting, medication     Treatment: Neuro Re-ed Standing balance exercises near countertop consisting of: - High knee marching fwd/bwd x 3 laps each way - Tandem gait fwd/bwd x 3 laps each way - Toe walk fwd/bwd x 3 laps each way - Heel walk fwd/bwd x 3 laps each way Patient required min guard assist for all  activities with light UE support on counter top   Rocker board in parallel bars: both ways on board with light to no UE support and min assist. Cues on posture and weight shifting for balance. - eyes closed no head movements, eyes closed head nods/shakes.  Exercises: Seated on large pball: cues on tall posture, core activation/abdominal bracing with all exercises - bouncing x 1 minute with emphasis on tall posture - pelvic rocks left<>right and fwd<>bwd x 10 each  - pelvic circles both ways x 10 reps - alternating UE raises x 10 each side - "V" x 10 reps - alternating long arc quads x 10 reps each leg - alternating marching x 10 reps each leg  Nustep x 4 extremities level 3.0 x 8 minutes with goal >/=50 steps per minute for strengthening and  activity tolearnce        PT Short Term Goals - 07/09/15 1428    PT SHORT TERM GOAL #1   Title Pt will perform home exercises with mod I using paper handout to indicate safe HEP compliance and to maximize functional gains made in PT. Target date: 08/06/15   Baseline Pt completely dependent for HEP.   PT SHORT TERM GOAL #2   Title Pt will increase 6MWT distance from 70' to 498' to indicate progress toward improved functional activity tolerance.  Target date: 08/06/15   Baseline 6MWT: 16' with SPC with seated rest break from 1:45 to 3:40.   PT SHORT TERM GOAL #3   Title Pt will increase self-selected gait speed from 1.69 ft/sec to 1.99 ft/sec to indicate decreased risk of recurrent falls.  Target date: 08/06/15   Baseline 1.69 ft/sec           PT Long Term Goals - 07/09/15 1431    PT LONG TERM GOAL #1   Title Pt will increase 6MWT distance from 423' to 573' to indicate significant improvement in functional endurance. Target date: 09/03/15   Baseline 423' with SPC and seated rest break from 1:45 to 3:40   PT LONG TERM GOAL #2   Title Pt will increase self-selected gait speed from 1.69 ft/sec to 2.29 ft/sec to indicate status of limited community ambulator. Target date: 09/03/15   Baseline 1.69 ft/sec   PT LONG TERM GOAL #3   Title Pt will decrease 5x Sit To Stand time from 18.32 sec to </= 12.6 sec without UE use to indicate decreased fall risk, improved functional LE strength. Target date: 09/03/15   Baseline 18.32 sec without UE use   PT LONG TERM GOAL #4   Title Pt will ambulate x500' over unlevel, paved surfaces with mod I using LRAD to indicate increased safety with limited community mobility. Target date: 09/03/15   Baseline Pt unable to ambulate >263' consecutively without seated rest break and supervision for safety.   PT LONG TERM GOAL #5   Title Pt will negotiate standard ramp and curb step with Mod I using LRAD to indicate increased safety traversing community obstacles.  Target date: 09/03/15   Baseline Requires min guard of daughter for curb step negotiation   Additional Long Term Goals   Additional Long Term Goals Yes   PT LONG TERM GOAL #6   Title Pt will perform floor transfer with mod I using standard chair to increase safety with fall recovery. Target date: 09/03/15   Baseline After fall in 12/2014, pt requires assist of family members to safely get up from floor.  Plan - 07/18/15 0807    Clinical Impression Statement Focused on balance and strengthening today with no issues reported. Pt challenged with single leg stance activities and compliant surfaces. No increase in pain reported. Pt making steady progress toward goals.   Pt will benefit from skilled therapeutic intervention in order to improve on the following deficits Abnormal gait;Decreased coordination;Decreased activity tolerance;Decreased endurance;Decreased balance;Decreased mobility;Decreased strength;Impaired sensation;Postural dysfunction;Pain;Other (comment)  Pain will not be directly addressed in PT due to nature of pain and unable to change pain with activity/movement   Rehab Potential Good   Clinical Impairments Affecting Rehab Potential chronic pain   PT Frequency 2x / week   PT Duration 8 weeks   PT Treatment/Interventions ADLs/Self Care Home Management;Vestibular;Functional mobility training;Stair training;Gait training;DME Instruction;Therapeutic activities;Therapeutic exercise;Balance training;Neuromuscular re-education;Patient/family education;Orthotic Fit/Training   PT Next Visit Plan Continue to work on strengthening and balance   Consulted and Agree with Plan of Care Patient        Problem List Patient Active Problem List   Diagnosis Date Noted  . Goals of care, counseling/discussion 01/18/2015  . ASCUS with positive high risk HPV 08/03/2014  . PVD (peripheral vascular disease) ([place]) 03/27/2014  . Diastolic CHF, chronic ([place]) 12/21/2013  .  Healthcare maintenance 04/25/2013  . Chronic pain syndrome 01/03/2013  . Insomnia 12/22/2011  . Vitamin B12 deficiency 12/27/2009  . Vitamin D deficiency 12/27/2009  . Anemia 12/27/2009  . Hypomagnesemia 12/29/2008  . OSTEOPENIA 08/01/2008  . Hyperlipidemia 11/01/2006  . GLAUCOMA NOS 11/01/2006  . DM (diabetes mellitus), type 2, uncontrolled, periph vascular complic ([place]) 24/26/8341  . Obesity 08/26/2006  . Depression 08/26/2006  . HTN (hypertension) 08/26/2006  . Coronary atherosclerosis 08/26/2006  . GERD 08/26/2006    Willow Ora 07/18/2015, 3:24 PM  Willow Ora, PTA, [facility] [address] [place], [place] [address] [phone] 07/18/2015, 3:24 PM

## 2015-07-23 ENCOUNTER — Ambulatory Visit: Payer: Medicare Other | Admitting: Physical Therapy

## 2015-07-23 DIAGNOSIS — R269 Unspecified abnormalities of gait and mobility: Secondary | ICD-10-CM

## 2015-07-23 DIAGNOSIS — R531 Weakness: Secondary | ICD-10-CM

## 2015-07-23 DIAGNOSIS — R2681 Unsteadiness on feet: Secondary | ICD-10-CM

## 2015-07-23 NOTE — Patient Instructions (Signed)
BRIDGING  While lying on your back, tighten your lower abdominals, squeeze your buttocks and then raise your buttocks off the floor/bed as creating a "Bridge" with your body. Hold for 5 seconds, 10 reps, 1-2 times a day.     STRAIGHT LEG RAISE - SLR  While lying with a straight knee. Keep the opposite knee bent with the foot planted to the ground. Raise straight leg up, keeping knee straight, toes pointed at ceiling. Hold for 5 seconds, 10 reps. 2 times a day. Perform with each leg.   SUPINE HIP ABDUCTION - ELASTIC BAND CLAMS  Lie down on your back with your knees bent. Place an elastic band around your knees and then draw your knees apart.  Hold for 2 seconds. Perform 15 reps, 2 times per day.   SIT TO STAND - NO HANDS  Start by sitting in a chair/edge of the sofa/bed. Next, raise up to standing without using your hands for support. Repeat 10 reps, 1-2 times a day.   STANDING HEEL RAISES  While standing, raise up on your toes as you lift your heels off the ground. Hold onto sturdy object for balance (chair or counter top). 20 reps, 1 time per day.    TOES RAISES - DORSIFLEXION STANDING  In a standing position with your feet on the ground, raise up your forefoot and toes as you bend at your ankle. hold for 5 seconds, 10 reps. 2 times a day.     Hip Abduction  Standing tall, lift one leg out to the side then return. Keep toes pointed forward. Alternate legs. Hold for 5 seconds. 10 reps each side, 1-2 times a day.   HIP EXTENSION - STANDING  While standing, move your leg back as shown. Keep knee as straight as possible and do not lean forward. Alternate legs, 10 reps each side, 1-2 times a day.  Use your arms for support if needed for balance and safety   On Elbows (Prone)    Rise up on forearms as high as possible, keeping hips on floor. Hold _10___ seconds. Then, return to resting position for 10 seconds. Repeat __5__ times per set when you're having left buttock  pain.  You can also do this in standing by facing a wall, holding onto wall with both hands, and letting hips touch the wall (so you're in the same position as you were on your stomach). Hold for 10 seconds, 3-5 times in a row. This should decrease the pain in your left buttock.

## 2015-07-23 NOTE — Therapy (Signed)
[facility] [address] [place], [place], [address] Phone: [phone]   Fax:  [phone]  Physical Therapy Treatment  Patient Details  Name: [name] [name] MRN: [id number] Date of Birth: Jan 08, 1946 Referring Provider:  Bartholomew Crews, MD  Encounter Date: 07/23/2015      PT End of Session - 07/23/15 1938    Visit Number 4   Number of Visits 17   Date for PT Re-Evaluation 09/07/15   Authorization Type UHC Medicare - G Codes every 10 visits   PT Start Time 0935   PT Stop Time 1015   PT Time Calculation (min) 40 min   Activity Tolerance Patient limited by fatigue;No increased pain   Behavior During Therapy [facility] for tasks assessed/performed      Past Medical History  Diagnosis Date  . DIABETES MELLITUS, TYPE II 08/26/2006    Insulin dependent. Victoza added 03/2014  . Chronic pain syndrome 01/03/2013    Multifactorial. Non opioid requiring.   L2-5 fusion, with hardware at L2-3, stable per MRI 2010. Followed by neurosurgery (Dr. 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    [name] DEPRESSION 08/26/2006    On paxil    . DYSLIPIDEMIA 11/01/2006    LDL goal < 100, 70 if can achieve without side effects.     . OBESITY 08/26/2006    BMI 39.5. Obesity Class 2.     . GERD 08/26/2006    Heartburn QHS.     [name] PVD (peripheral vascular disease) ([place]) 03/27/2014    2015 LE arterial Duplex - Possible mild inflow disease on the left. 0-49% bilateral SFA disease, without focal stenosis. Three vesel run-off, bilaterally. Medical tx.   [name] CAD 08/26/2006    Cath 07/05:multiple areas of nonobstructive disease = medical & RF mgmt, well preserved global systolic function. Dr. Lia Foyer. Nuclear med stress test 01/2014 : Normal stress nuclear study. LV Ejection Fraction: 76%. LV Wall Motion: NL LV Function; NL Wall Motion.      [name] HYPERTENSION 08/26/2006    On BB, ACEI, lasix,  norvasc, metolazone, and imdur.       Past Surgical History  Procedure Laterality Date  . Posterior lumbar interfusion surgery  07/18/07    L2-L3  . Lumbar fusion surgery  10/08    L3-L5  . Rotator cuff surgery    . Cholecystectomy    . Endometrial biospy      There were no vitals filed for this visit.  Visit Diagnosis:  Weakness generalized  Abnormality of gait  Unsteadiness      Subjective Assessment - 07/23/15 0939    Subjective Pt reports no falls. Attributes sleep limitation to pain in L buttock. Pt sleeps on R side.   Pertinent History * Pt unable to read. Daughter, [name], able to help with some basic reading. PMH: non-obstructive CAD, HTN, HLD, chronic diastolic CHF, LE claudication, DM 2, chronic back in lower back   Limitations Sitting;Lifting;Standing;Walking;House hold activities   Patient Stated Goals "Be able to stand and do some walking. I want to be able to go to the grocery store."   Currently in Pain? Yes   Pain Score 7    Pain Location Back   Pain Orientation Right;Left   Pain Descriptors / Indicators Shooting   Pain Type Chronic pain   Pain Radiating Towards into B buttocks   Pain Onset More than a month ago   Pain Frequency Intermittent  Aggravating Factors  standing, activity   Pain Relieving Factors medication, sitting   Effect of Pain on Daily Activities limiting standing tolerance   Multiple Pain Sites No            [facility] PT Assessment - 07/23/15 0001    Observation/Other Assessments   Observations Noted increased concordant pain in L buttock with thoracolumbar flexion and with flexion-based exercises; concordant buttock pain dissipated with extension-based exercies.                     [facility] Adult PT Treatment/Exercise - 07/23/15 0001    Posture/Postural Control   Posture/Postural Control Postural limitations   Postural Limitations Rounded Shoulders;Forward head;Flexed trunk   Posture Comments Lateral trunk shift to R  side   Exercises   Exercises Other Exercises   Other Exercises  Reviewed/progress previous home exercises; see HEP for details on exercies, sets/reps. Added prone on elbows 6 x10-second holds and standing extension 3 x10-second holds to address L buttock pain; pt with effective return demonstration.                 PT Education - 07/23/15 1926    Education provided Yes   Education Details HEP: added thoracolumbar extension for centralization of symptoms.   Person(s) Educated Patient   Methods Explanation;Demonstration;Tactile cues;Verbal cues;Handout   Comprehension Verbalized understanding;Returned demonstration          PT Short Term Goals - 07/23/15 1923    PT SHORT TERM GOAL #1   Title Pt will perform home exercises with mod I using paper handout to indicate safe HEP compliance and to maximize functional gains made in PT. Target date: 08/06/15   Baseline Achieved 10/11   Status Achieved   PT SHORT TERM GOAL #2   Title Pt will increase 6MWT distance from 41' to 498' to indicate progress toward improved functional activity tolerance.  Target date: 08/06/15   Baseline 6MWT: 59' with SPC with seated rest break from 1:45 to 3:40.   PT SHORT TERM GOAL #3   Title Pt will increase self-selected gait speed from 1.69 ft/sec to 1.99 ft/sec to indicate decreased risk of recurrent falls.  Target date: 08/06/15   Baseline 1.69 ft/sec           PT Long Term Goals - 07/09/15 1431    PT LONG TERM GOAL #1   Title Pt will increase 6MWT distance from 423' to 573' to indicate significant improvement in functional endurance. Target date: 09/03/15   Baseline 423' with SPC and seated rest break from 1:45 to 3:40   PT LONG TERM GOAL #2   Title Pt will increase self-selected gait speed from 1.69 ft/sec to 2.29 ft/sec to indicate status of limited community ambulator. Target date: 09/03/15   Baseline 1.69 ft/sec   PT LONG TERM GOAL #3   Title Pt will decrease 5x Sit To Stand time from  18.32 sec to </= 12.6 sec without UE use to indicate decreased fall risk, improved functional LE strength. Target date: 09/03/15   Baseline 18.32 sec without UE use   PT LONG TERM GOAL #4   Title Pt will ambulate x500' over unlevel, paved surfaces with mod I using LRAD to indicate increased safety with limited community mobility. Target date: 09/03/15   Baseline Pt unable to ambulate >263' consecutively without seated rest break and supervision for safety.   PT LONG TERM GOAL #5   Title Pt will negotiate standard ramp and curb step with Mod  I using LRAD to indicate increased safety traversing community obstacles. Target date: 09/03/15   Baseline Requires min guard of daughter for curb step negotiation   Additional Long Term Goals   Additional Long Term Goals Yes   PT LONG TERM GOAL #6   Title Pt will perform floor transfer with mod I using standard chair to increase safety with fall recovery. Target date: 09/03/15   Baseline After fall in 12/2014, pt requires assist of family members to safely get up from floor.               Plan - 07/23/15 1939    Clinical Impression Statement Skilled session focused on proximal LE strengthening. Pt met STG 1 for HEP compliance. Noted decreased L buttock pain with extension-based exercises; therefore, added prone on elbows and standing lumbar extension to HEP. Continue per POC.   Pt will benefit from skilled therapeutic intervention in order to improve on the following deficits Abnormal gait;Decreased coordination;Decreased activity tolerance;Decreased endurance;Decreased balance;Decreased mobility;Decreased strength;Impaired sensation;Postural dysfunction;Pain   Rehab Potential Good   Clinical Impairments Affecting Rehab Potential chronic pain   PT Frequency 2x / week   PT Duration 8 weeks   PT Treatment/Interventions ADLs/Self Care Home Management;Vestibular;Functional mobility training;Stair training;Gait training;DME Instruction;Therapeutic  activities;Therapeutic exercise;Balance training;Neuromuscular re-education;Patient/family education;Orthotic Fit/Training   PT Next Visit Plan Continue to work on strengthening, balance, and activity tolerance. Incorporate extension-based exercises, as pain appeared to centralize with bridging, prone on elbows.   Consulted and Agree with Plan of Care Patient        Problem List Patient Active Problem List   Diagnosis Date Noted  . Goals of care, counseling/discussion 01/18/2015  . ASCUS with positive high risk HPV 08/03/2014  . PVD (peripheral vascular disease) ([place]) 03/27/2014  . Diastolic CHF, chronic ([place]) 12/21/2013  . Healthcare maintenance 04/25/2013  . Chronic pain syndrome 01/03/2013  . Insomnia 12/22/2011  . Vitamin B12 deficiency 12/27/2009  . Vitamin D deficiency 12/27/2009  . Anemia 12/27/2009  . Hypomagnesemia 12/29/2008  . OSTEOPENIA 08/01/2008  . Hyperlipidemia 11/01/2006  . GLAUCOMA NOS 11/01/2006  . DM (diabetes mellitus), type 2, uncontrolled, periph vascular complic ([place]) 08/67/6195  . Obesity 08/26/2006  . Depression 08/26/2006  . HTN (hypertension) 08/26/2006  . Coronary atherosclerosis 08/26/2006  . GERD 08/26/2006    Billie Ruddy, PT, DPT [facility] [address] [place], [place], [address] Phone: [phone]   Fax:  [phone] 07/23/2015, 7:44 PM

## 2015-07-25 ENCOUNTER — Ambulatory Visit: Payer: Medicare Other | Admitting: Physical Therapy

## 2015-07-25 ENCOUNTER — Encounter: Payer: Self-pay | Admitting: Physical Therapy

## 2015-07-25 DIAGNOSIS — R531 Weakness: Secondary | ICD-10-CM | POA: Diagnosis not present

## 2015-07-25 DIAGNOSIS — R2681 Unsteadiness on feet: Secondary | ICD-10-CM

## 2015-07-25 DIAGNOSIS — R269 Unspecified abnormalities of gait and mobility: Secondary | ICD-10-CM

## 2015-07-25 NOTE — Therapy (Signed)
[facility] [address] [place], [place], [address] Phone: [phone]   Fax:  [phone]  Physical Therapy Treatment  Patient Details  Name: [name] [name] MRN: [id number] Date of Birth: Mar 17, 1946 Referring Provider:  Bartholomew Crews, MD  Encounter Date: 07/25/2015      PT End of Session - 07/25/15 0853    Visit Number 5  G5   Number of Visits 17   Date for PT Re-Evaluation 09/07/15   Authorization Type [employer] - G Codes every 10 visits   PT Start Time 0848   PT Stop Time 0930   PT Time Calculation (min) 42 min   Activity Tolerance Patient limited by fatigue;No increased pain   Behavior During Therapy [facility] for tasks assessed/performed      Past Medical History  Diagnosis Date  . DIABETES MELLITUS, TYPE II 08/26/2006    Insulin dependent. Victoza added 03/2014  . Chronic pain syndrome 01/03/2013    Multifactorial. Non opioid requiring.   L2-5 fusion, with hardware at L2-3, stable per MRI 2010. Followed by neurosurgery (Dr. 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    [name] DEPRESSION 08/26/2006    On paxil    . DYSLIPIDEMIA 11/01/2006    LDL goal < 100, 70 if can achieve without side effects.     . OBESITY 08/26/2006    BMI 39.5. Obesity Class 2.     . GERD 08/26/2006    Heartburn QHS.     [name] PVD (peripheral vascular disease) ([place]) 03/27/2014    2015 LE arterial Duplex - Possible mild inflow disease on the left. 0-49% bilateral SFA disease, without focal stenosis. Three vesel run-off, bilaterally. Medical tx.   [name] CAD 08/26/2006    Cath 07/05:multiple areas of nonobstructive disease = medical & RF mgmt, well preserved global systolic function. Dr. Lia Foyer. Nuclear med stress test 01/2014 : Normal stress nuclear study. LV Ejection Fraction: 76%. LV Wall Motion: NL LV Function; NL Wall Motion.      [name] HYPERTENSION 08/26/2006    On BB, ACEI,  lasix, norvasc, metolazone, and imdur.       Past Surgical History  Procedure Laterality Date  . Posterior lumbar interfusion surgery  07/18/07    L2-L3  . Lumbar fusion surgery  10/08    L3-L5  . Rotator cuff surgery    . Cholecystectomy    . Endometrial biospy      There were no vitals filed for this visit.  Visit Diagnosis:  Weakness generalized  Abnormality of gait  Unsteadiness      Subjective Assessment - 07/25/15 0851    Subjective Hurting all over today   Currently in Pain? Yes   Pain Score 8    Pain Location Generalized  hips down into legs, left wrist   Pain Descriptors / Indicators Sharp;Shooting   Pain Type Chronic pain   Pain Onset Yesterday  last night   Pain Frequency Intermittent   Aggravating Factors  increased standing, activity   Pain Relieving Factors rest, sitting, medication     Treatment: Neuro Re-ed: Red mats next to counter top: 1 UE support on counter and min guard assist for balance, 3 laps each/each way. High knee marching fwd/bwd  tandem gait fwd/bwd,  Toe walking fwd/bwd Heel walking fwd/bwd   Exercises: Prone  - press ups, 5 sec's x 10 reps - alternating hamstring curls x 5 each side - with knee extension,  leg lifts x 10 each side - heels squeezes, 5 sec hold x 10 reps - glut kick backs x 10 each  Standing: Back extension x 10 reps with 5 sec hold  Quadruped over blue proll: Alternating leg outs x 10 reps each side        PT Short Term Goals - 07/23/15 1923    PT SHORT TERM GOAL #1   Title Pt will perform home exercises with mod I using paper handout to indicate safe HEP compliance and to maximize functional gains made in PT. Target date: 08/06/15   Baseline Achieved 10/11   Status Achieved   PT SHORT TERM GOAL #2   Title Pt will increase 6MWT distance from 52' to 498' to indicate progress toward improved functional activity tolerance.  Target date: 08/06/15   Baseline 6MWT: 18' with SPC with seated rest break  from 1:45 to 3:40.   PT SHORT TERM GOAL #3   Title Pt will increase self-selected gait speed from 1.69 ft/sec to 1.99 ft/sec to indicate decreased risk of recurrent falls.  Target date: 08/06/15   Baseline 1.69 ft/sec           PT Long Term Goals - 07/09/15 1431    PT LONG TERM GOAL #1   Title Pt will increase 6MWT distance from 423' to 573' to indicate significant improvement in functional endurance. Target date: 09/03/15   Baseline 423' with SPC and seated rest break from 1:45 to 3:40   PT LONG TERM GOAL #2   Title Pt will increase self-selected gait speed from 1.69 ft/sec to 2.29 ft/sec to indicate status of limited community ambulator. Target date: 09/03/15   Baseline 1.69 ft/sec   PT LONG TERM GOAL #3   Title Pt will decrease 5x Sit To Stand time from 18.32 sec to </= 12.6 sec without UE use to indicate decreased fall risk, improved functional LE strength. Target date: 09/03/15   Baseline 18.32 sec without UE use   PT LONG TERM GOAL #4   Title Pt will ambulate x500' over unlevel, paved surfaces with mod I using LRAD to indicate increased safety with limited community mobility. Target date: 09/03/15   Baseline Pt unable to ambulate >263' consecutively without seated rest break and supervision for safety.   PT LONG TERM GOAL #5   Title Pt will negotiate standard ramp and curb step with Mod I using LRAD to indicate increased safety traversing community obstacles. Target date: 09/03/15   Baseline Requires min guard of daughter for curb step negotiation   Additional Long Term Goals   Additional Long Term Goals Yes   PT LONG TERM GOAL #6   Title Pt will perform floor transfer with mod I using standard chair to increase safety with fall recovery. Target date: 09/03/15   Baseline After fall in 12/2014, pt requires assist of family members to safely get up from floor.           Plan - 07/25/15 0853    Clinical Impression Statement Continued to work on strengthening and balance today  without any issues reported. Added additional exercises with extension focus without any issues reported. Pt making steady progress toward goals.   Pt will benefit from skilled therapeutic intervention in order to improve on the following deficits Abnormal gait;Decreased coordination;Decreased activity tolerance;Decreased endurance;Decreased balance;Decreased mobility;Decreased strength;Impaired sensation;Postural dysfunction;Pain;Decreased range of motion   Rehab Potential Good   Clinical Impairments Affecting Rehab Potential chronic pain   PT Frequency 2x / week   PT  Duration 8 weeks   PT Treatment/Interventions ADLs/Self Care Home Management;Vestibular;Functional mobility training;Stair training;Gait training;DME Instruction;Therapeutic activities;Therapeutic exercise;Balance training;Neuromuscular re-education;Patient/family education;Orthotic Fit/Training   PT Next Visit Plan Continue to work on strengthening, balance, and activity tolerance. Incorporate extension-based exercises, as pain appeared to centralize with bridging, prone on elbows.   Consulted and Agree with Plan of Care Patient        Problem List Patient Active Problem List   Diagnosis Date Noted  . Goals of care, counseling/discussion 01/18/2015  . ASCUS with positive high risk HPV 08/03/2014  . PVD (peripheral vascular disease) ([place]) 03/27/2014  . Diastolic CHF, chronic ([place]) 12/21/2013  . Healthcare maintenance 04/25/2013  . Chronic pain syndrome 01/03/2013  . Insomnia 12/22/2011  . Vitamin B12 deficiency 12/27/2009  . Vitamin D deficiency 12/27/2009  . Anemia 12/27/2009  . Hypomagnesemia 12/29/2008  . OSTEOPENIA 08/01/2008  . Hyperlipidemia 11/01/2006  . GLAUCOMA NOS 11/01/2006  . DM (diabetes mellitus), type 2, uncontrolled, periph vascular complic ([place]) 61/22/4497  . Obesity 08/26/2006  . Depression 08/26/2006  . HTN (hypertension) 08/26/2006  . Coronary atherosclerosis 08/26/2006  . GERD 08/26/2006     Willow Ora 07/26/2015, 9:21 AM  Willow Ora, PTA, [facility] [address], [place], [place] [address] [phone] 07/26/2015, 9:21 AM

## 2015-07-30 ENCOUNTER — Ambulatory Visit: Payer: Medicare Other | Admitting: Physical Therapy

## 2015-08-01 ENCOUNTER — Ambulatory Visit: Payer: Medicare Other | Admitting: Physical Therapy

## 2015-08-06 ENCOUNTER — Ambulatory Visit: Payer: Medicare Other | Admitting: Physical Therapy

## 2015-08-06 DIAGNOSIS — R269 Unspecified abnormalities of gait and mobility: Secondary | ICD-10-CM

## 2015-08-06 DIAGNOSIS — R2681 Unsteadiness on feet: Secondary | ICD-10-CM

## 2015-08-06 DIAGNOSIS — R531 Weakness: Secondary | ICD-10-CM

## 2015-08-06 NOTE — Therapy (Signed)
[facility] [address] [place], [place], [address] Phone: [phone]   Fax:  [phone]  Physical Therapy Treatment  Patient Details  Name: [name] [name] MRN: [id number] Date of Birth: [birth year] No Data Recorded  Encounter Date: 08/06/2015      PT End of Session - 08/06/15 1143    Visit Number 6   Number of Visits 18   Date for PT Re-Evaluation 08/08/15   PT Start Time 1779   PT Stop Time 1100   PT Time Calculation (min) 45 min   Equipment Utilized During Treatment Gait belt   Activity Tolerance Patient limited by pain;Patient limited by fatigue   Behavior During Therapy [facility] for tasks assessed/performed      Past Medical History  Diagnosis Date  . DIABETES MELLITUS, TYPE II 08/26/2006    Insulin dependent. Victoza added 03/2014  . Chronic pain syndrome 01/03/2013    Multifactorial. Non opioid requiring.   L2-5 fusion, with hardware at L2-3, stable per MRI 2010. Followed by neurosurgery (Dr. 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    [name] DEPRESSION 08/26/2006    On paxil    . DYSLIPIDEMIA 11/01/2006    LDL goal < 100, 70 if can achieve without side effects.     . OBESITY 08/26/2006    BMI 39.5. Obesity Class 2.     . GERD 08/26/2006    Heartburn QHS.     [name] PVD (peripheral vascular disease) ([place]) 03/27/2014    2015 LE arterial Duplex - Possible mild inflow disease on the left. 0-49% bilateral SFA disease, without focal stenosis. Three vesel run-off, bilaterally. Medical tx.   [name] CAD 08/26/2006    Cath 07/05:multiple areas of nonobstructive disease = medical & RF mgmt, well preserved global systolic function. Dr. Lia Foyer. Nuclear med stress test 01/2014 : Normal stress nuclear study. LV Ejection Fraction: 76%. LV Wall Motion: NL LV Function; NL Wall Motion.      [name] HYPERTENSION 08/26/2006    On BB, ACEI, lasix, norvasc, metolazone, and imdur.        Past Surgical History  Procedure Laterality Date  . Posterior lumbar interfusion surgery  07/18/07    L2-L3  . Lumbar fusion surgery  10/08    L3-L5  . Rotator cuff surgery    . Cholecystectomy    . Endometrial biospy      There were no vitals filed for this visit.  Visit Diagnosis:  Generalized weakness      Subjective Assessment - 08/06/15 1117    Subjective Pt. stated that she still has the shooting pain in her L LE. She stated her pain to be 6/10 during treatment. Pt. stated that more than anything her endurance is what limits her function. "My daughter goes to hte grocery store for me.    Patient is accompained by: Family member   Limitations Walking;House hold activities   Currently in Pain? Yes   Pain Score 6    Pain Location Leg   Pain Orientation Left   Pain Descriptors / Indicators Shooting   Pain Type Chronic pain   Pain Onset 1 to 4 weeks ago   Pain Frequency Intermittent   Aggravating Factors  walking   Pain Relieving Factors rest, sitting   Effect of Pain on Daily Activities limited standing tolerance        08/06/15 0001  Transfers  Sit to Stand 7: Independent  Stand to Sit  7: Independent  Ambulation/Gait  Ambulation/Gait Yes  Ambulation/Gait Assistance 4: Min guard (Pt. min. guard due to lack of endurance.)  Ambulation Distance (Feet) 709 Feet (x1 with 6 MWT; 120)  Assistive device Straight cane  Gait Pattern Step-through pattern  Ambulation Surface Indoor;Level  Gait velocity 1.97 ft/sec (16.6 sec's with cane)  Posture/Postural Control  Posture/Postural Control Postural limitations  Postural Limitations Rounded Shoulders  Posture Comments Lateral trunk shift to R side  Exercises  Exercises Other Exercises     Dynamic balance ex.'s on red mats. 3 laps each ex. highknees Heel walking Toe walking  Dynamic balance ex.'s in parallel bars. 3 laps each ex. Side stepping with high knees Walking backward with high knees  Static  standing balance ex.'s in parallel bars on AirX. SLS with alternating toe touches. Starting with one toe touch and advancing to 3 toe touches on each side. 30 sec.'s eyes closed in normal stance width. 30 sec.'s with feet together with horizontal head turns. 30 sec.'s on each side in semi-tandem with eyes closed.  Sit to stands on box with balance cushion (simulated sofa) for lower seating height. 3 sets X10 reps without hand support.   Pt needed min guard to min assist for all balance activities. Cues on posture and weight shifting needed as well.         PT Education - 08/06/15 1140    Education provided Yes   Education Details Pt. educated on balance exercises to strengthen and stabilize LE's to reduce risk of falls. Pt. educated on using ex.'s and walking throughout the day to increase her functional endurance.   Person(s) Educated Patient;Caregiver(s)   Methods Explanation   Comprehension Verbalized understanding          PT Short Term Goals - 08/06/15 1151    PT SHORT TERM GOAL #2   Title Pt will increase 6MWT distance from 423' to 498' to indicate progress toward improved functional activity tolerance.  Target date: 08/06/15   Baseline 6MWT: 6' with SPC with seated rest break from 1:45 to 3:40. Pt. able to ambulate 709 ft. in six minutes.   Status Achieved   PT SHORT TERM GOAL #3   Title Pt will increase self-selected gait speed from 1.69 ft/sec to 1.99 ft/sec to indicate decreased risk of recurrent falls.  Target date: 08/06/15   Baseline 2.05 ft. per second is her new gait speed for 50m walk test.   Status Achieved           PT Long Term Goals - 08/06/15 1154    PT LONG TERM GOAL #1   Title Pt will increase 6MWT distance from 423' to 573' to indicate significant improvement in functional endurance. Target date: 09/03/15   Baseline 6MWT= 753ft.    Status Achieved   PT LONG TERM GOAL #2   Title Pt will increase self-selected gait speed from 1.69 ft/sec to 2.29  ft/sec to indicate status of limited community ambulator. Target date: 09/03/15   Status On-going   PT LONG TERM GOAL #3   Title Pt will decrease 5x Sit To Stand time from 18.32 sec to </= 12.6 sec without UE use to indicate decreased fall risk, improved functional LE strength. Target date: 09/03/15   Status On-going   PT LONG TERM GOAL #4   Title Pt will ambulate x500' over unlevel, paved surfaces with mod I using LRAD to indicate increased safety with limited community mobility. Target date: 09/03/15   Status On-going   PT LONG  TERM GOAL #5   Title Pt will negotiate standard ramp and curb step with Mod I using LRAD to indicate increased safety traversing community obstacles. Target date: 09/03/15   Status On-going   PT LONG TERM GOAL #6   Status On-going             Plan - 08/06/15 1145    Clinical Impression Statement Pt. has achieved all STG's. Pt. With increased gait distance. Pt. Challenged with balance activities. Still limited in function due to lack of endurance and strength.    Pt will benefit from skilled therapeutic intervention in order to improve on the following deficits Decreased activity tolerance;Decreased endurance;Decreased balance;Decreased mobility;Decreased strength;Impaired sensation;Postural dysfunction;Pain;Decreased range of motion   Rehab Potential Good   Clinical Impairments Affecting Rehab Potential chronic pain   PT Frequency 2x / week   PT Duration 8 weeks   PT Treatment/Interventions ADLs/Self Care Home Management;Vestibular;Functional mobility training;Stair training;Gait training;DME Instruction;Therapeutic activities;Therapeutic exercise;Balance training;Neuromuscular re-education;Patient/family education;Orthotic Fit/Training   PT Next Visit Plan Continue to work on strengthening, balance, and activity tolerance. Incorporate extension-based exercises, as pain appeared to centralize with bridging, prone on elbows.     Consulted and Agree with Plan of  Care Patient;Family member/caregiver        Problem List Patient Active Problem List   Diagnosis Date Noted  . Goals of care, counseling/discussion 01/18/2015  . ASCUS with positive high risk HPV 08/03/2014  . PVD (peripheral vascular disease) ([place]) 03/27/2014  . Diastolic CHF, chronic ([place]) 12/21/2013  . Healthcare maintenance 04/25/2013  . Chronic pain syndrome 01/03/2013  . Insomnia 12/22/2011  . Vitamin B12 deficiency 12/27/2009  . Vitamin D deficiency 12/27/2009  . Anemia 12/27/2009  . Hypomagnesemia 12/29/2008  . OSTEOPENIA 08/01/2008  . Hyperlipidemia 11/01/2006  . GLAUCOMA NOS 11/01/2006  . DM (diabetes mellitus), type 2, uncontrolled, periph vascular complic ([place]) 56/43/3295  . Obesity 08/26/2006  . Depression 08/26/2006  . HTN (hypertension) 08/26/2006  . Coronary atherosclerosis 08/26/2006  . GERD 08/26/2006    Laney Potash 08/07/2015, 3:16 PM  Laney Potash, [place]  Name: [name] [name] MRN: [id number] Date of Birth: [birth year]  This note has been reviewed and edited by supervision CI.  Willow Ora, PTA, [facility] [address], [place], [place] [address] [phone] 08/07/2015, 10:16 PM

## 2015-08-08 ENCOUNTER — Ambulatory Visit: Payer: Medicare Other | Admitting: Physical Therapy

## 2015-08-08 ENCOUNTER — Encounter: Payer: Self-pay | Admitting: Physical Therapy

## 2015-08-08 DIAGNOSIS — R269 Unspecified abnormalities of gait and mobility: Secondary | ICD-10-CM

## 2015-08-08 DIAGNOSIS — R531 Weakness: Secondary | ICD-10-CM | POA: Diagnosis not present

## 2015-08-08 DIAGNOSIS — R2681 Unsteadiness on feet: Secondary | ICD-10-CM

## 2015-08-08 NOTE — Therapy (Signed)
[facility] [address] [place], [place], [address] Phone: [phone]   Fax:  [phone]  Physical Therapy Treatment  Patient Details  Name: BOWIE DOIRON MRN: [id number] Date of Birth: 09-24-1946 No Data Recorded  Encounter Date: 08/08/2015      PT End of Session - 08/08/15 1059    Visit Number 7   Number of Visits 19   Date for PT Re-Evaluation 09/03/15   Authorization Type UHC Medicare - G Codes every 10 visits   PT Start Time [phone]   PT Stop Time 1015   PT Time Calculation (min) 38 min   Equipment Utilized During Treatment Gait belt   Activity Tolerance Patient limited by pain;Patient limited by fatigue      Past Medical History  Diagnosis Date  . DIABETES MELLITUS, TYPE II 08/26/2006    Insulin dependent. Victoza added 03/2014  . Chronic pain syndrome 01/03/2013    Multifactorial. Non opioid requiring.   L2-5 fusion, with hardware at L2-3, stable per MRI 2010. Followed by neurosurgery (Dr. 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    [name] DEPRESSION 08/26/2006    On paxil    . DYSLIPIDEMIA 11/01/2006    LDL goal < 100, 70 if can achieve without side effects.     . OBESITY 08/26/2006    BMI 39.5. Obesity Class 2.     . GERD 08/26/2006    Heartburn QHS.     [name] PVD (peripheral vascular disease) ([place]) 03/27/2014    2015 LE arterial Duplex - Possible mild inflow disease on the left. 0-49% bilateral SFA disease, without focal stenosis. Three vesel run-off, bilaterally. Medical tx.   [name] CAD 08/26/2006    Cath 07/05:multiple areas of nonobstructive disease = medical & RF mgmt, well preserved global systolic function. Dr. Lia Foyer. Nuclear med stress test 01/2014 : Normal stress nuclear study. LV Ejection Fraction: 76%. LV Wall Motion: NL LV Function; NL Wall Motion.      [name] HYPERTENSION 08/26/2006    On BB, ACEI, lasix, norvasc, metolazone, and imdur.        Past Surgical History  Procedure Laterality Date  . Posterior lumbar interfusion surgery  07/18/07    L2-L3  . Lumbar fusion surgery  10/08    L3-L5  . Rotator cuff surgery    . Cholecystectomy    . Endometrial biospy      There were no vitals filed for this visit.  Visit Diagnosis:  Generalized weakness  Abnormality of gait  Unsteadiness  Weakness generalized      Subjective Assessment - 08/08/15 1052    Subjective Pt. reported pain in B hips with shooting pain in the left hip. Pain increased during therapy. Pain began at 4/10 and increased to 6/10 throughout treatment. I do ok without my cane unless I am tired or on stairs."   Patient is accompained by: Family member   Pertinent History * Pt unable to read. Daughter, [name], able to help with some basic reading. PMH: non-obstructive CAD, HTN, HLD, chronic diastolic CHF, LE claudication, DM 2, chronic back in lower back   Limitations Walking;House hold activities   Patient Stated Goals "Be able to stand and do some walking. I want to be able to go to the grocery store."   Currently in Pain? Yes   Pain Score 6    Pain Location Leg   Pain Orientation Left;Right   Pain Descriptors / Indicators  Shooting;Aching   Pain Type Chronic pain   Pain Frequency Intermittent   Aggravating Factors  weight bearing activity   Pain Relieving Factors rest, sitting   Effect of Pain on Daily Activities limited in weight bearing endurance   Multiple Pain Sites No           PT Education - 08/08/15 1057    Education provided Yes   Education Details Pt. educated on sequencing with stairs to improve safety in community ambulation. Pt. encouraged to go to store with daughter and use shopping cart support to increase weightbearing activity.   Person(s) Educated Patient   Methods Explanation   Comprehension Verbalized understanding     Gait training: On stairs, curb and ramp. Verbal cues for sequencing and foot placement.  Pt.  completed 16 total steps with supervision with cane only, no rails. Pt. with reciprocal ascending and step to on descent. Pt. completed ramp X4 with supervision without AD. No verbal cues needed. Cane in her hand for emergency use. Pt. completed curb on ramp X20 without AD. Cane in her hand for emergency use.  Therapeutic exercise: Pt. Completed 535 ft. total of gait on indoor, level surfaces without rest breaks. The first 260f. were completed  carrying the cane.   The second 3198f Were completed with use of cane due to fatigue. This partially achieved goal 4. She required  supervision assist.   Sit to stands on mat: 20 reps feet staggered to increase use of L LE, Then 14 inch box with AirX on top to increase intensity of exercise: 2 sets: 10 reps, 5 reps.  Up and over 4 inch platform leading with L LE to increase concentric and eccentric strength.  Balance training in Parallel bars: Cues to keep head up and look forward, and for proper posture. High marching, 3 laps Heel walking, 3 laps On long blue foam: 4 laps of tandem walking.       PT Short Term Goals - 08/08/15 1112    PT SHORT TERM GOAL #1   Title Pt will perform home exercises with mod I using paper handout to indicate safe HEP compliance and to maximize functional gains made in PT. Target date: 08/06/15   Baseline Achieved 10/11   Status Achieved   PT SHORT TERM GOAL #2   Title Pt will increase 6MWT distance from 4222to 498' to indicate progress toward improved functional activity tolerance.  Target date: 08/06/15   Baseline 6MWT: 4269with SPC with seated rest break from 1:45 to 3:40. Pt. able to ambulate 709 ft. in six minutes.   Status Achieved   PT SHORT TERM GOAL #3   Title Pt will increase self-selected gait speed from 1.69 ft/sec to 1.99 ft/sec to indicate decreased risk of recurrent falls.  Target date: 08/06/15   Baseline 2.05 ft. per second is her new gait speed for 1082mlk test.   Status Achieved            PT Long Term Goals - 08/08/15 1102    PT LONG TERM GOAL #1   Title Pt will increase 6MWT distance from 423' to 573' to indicate significant improvement in functional endurance. Target date: 09/03/15   Baseline achieved 08/06/15   Status Achieved   PT LONG TERM GOAL #2   Title Pt will increase self-selected gait speed from 1.69 ft/sec to 2.29 ft/sec to indicate status of limited community ambulator. Target date: 09/03/15   Status On-going   PT LONG TERM GOAL #3  Title Pt will decrease 5x Sit To Stand time from 18.32 sec to </= 12.6 sec without UE use to indicate decreased fall risk, improved functional LE strength. Target date: 09/03/15   Status On-going   PT LONG TERM GOAL #4   Title Pt will ambulate x500' over unlevel, paved surfaces with mod I using LRAD to indicate increased safety with limited community mobility. Target date: 09/03/15   Baseline Pt. has achieved this goal minus the unlevel surfaces. She ambulated with and without cane for 570f. without rest break on indoor level surfaces with supervision.   Status Partially Met   PT LONG TERM GOAL #5   Title Pt will negotiate standard ramp and curb step with Mod I using LRAD to indicate increased safety traversing community obstacles. Target date: 09/03/15   Baseline Goal achieved: 10/27/16Pt. achieved this goal without assistive device. Advised to go up curb and steps leading with stronger R LE for increased safety.   Status Achieved   PT LONG TERM GOAL #6   Title Pt will perform floor transfer with mod I using standard chair to increase safety with fall recovery. Target date: 09/03/15   Baseline After fall in 12/2014, pt requires assist of family members to safely get up from floor.   Status On-going             Plan - 08/08/15 1101    Clinical Impression Statement Pt is progressing toward LTG's 2,3, and 6. She has achieved goals 1 and 5. She has partially met goal 4, but still needs to exhibit increased safety on  unlevel surfaces.    Pt will benefit from skilled therapeutic intervention in order to improve on the following deficits Decreased activity tolerance;Decreased endurance;Decreased balance;Decreased mobility;Decreased strength;Impaired sensation;Postural dysfunction;Pain;Decreased range of motion   Rehab Potential Good   Clinical Impairments Affecting Rehab Potential chronic pain   PT Frequency 2x / week   PT Duration 8 weeks   PT Treatment/Interventions ADLs/Self Care Home Management;Vestibular;Functional mobility training;Stair training;Gait training;DME Instruction;Therapeutic activities;Therapeutic exercise;Balance training;Neuromuscular re-education;Patient/family education;Orthotic Fit/Training   PT Next Visit Plan Continue to work on strengthening, balance, and activity tolerance. Incorporate extension-based exercises, as pain appeared to centralize with bridging, prone on elbows.    Consulted and Agree with Plan of Care Patient;Family member/caregiver        Problem List Patient Active Problem List   Diagnosis Date Noted  . Goals of care, counseling/discussion 01/18/2015  . ASCUS with positive high risk HPV 08/03/2014  . PVD [place]peripheral vascular disease) ([place] 03/27/2014  . Diastolic CHF, chronic ([place] 12/21/2013  . Healthcare maintenance 04/25/2013  . Chronic pain syndrome 01/03/2013  . Insomnia 12/22/2011  . Vitamin B12 deficiency 12/27/2009  . Vitamin D deficiency 12/27/2009  . Anemia 12/27/2009  . Hypomagnesemia 12/29/2008  . OSTEOPENIA 08/01/2008  . Hyperlipidemia 11/01/2006  . GLAUCOMA NOS 11/01/2006  . DM (diabetes mellitus), type 2, uncontrolled, periph vascular complic ([place] 122/48/2500 . Obesity 08/26/2006  . Depression 08/26/2006  . HTN (hypertension) 08/26/2006  . Coronary atherosclerosis 08/26/2006  . GERD 08/26/2006    DLaney Potash10/27/2016, 11:17 AM  DLaney Potash SPTA  Name: RTAIYA NUTTINGMRN: [id number] of Birth: [birth year] This  note has been reviewed and edited by supervision CI.  KWillow Ora PTA, CHyrum[address] STillsonGMauna Loa Estates [place] [phone]/28/2016, 12:24 PM

## 2015-08-13 ENCOUNTER — Ambulatory Visit: Payer: Medicare Other | Attending: Internal Medicine | Admitting: Physical Therapy

## 2015-08-13 DIAGNOSIS — R2681 Unsteadiness on feet: Secondary | ICD-10-CM | POA: Diagnosis not present

## 2015-08-13 DIAGNOSIS — R531 Weakness: Secondary | ICD-10-CM | POA: Insufficient documentation

## 2015-08-13 DIAGNOSIS — R269 Unspecified abnormalities of gait and mobility: Secondary | ICD-10-CM | POA: Insufficient documentation

## 2015-08-13 NOTE — Patient Instructions (Signed)
HIP: External Rotation    Sit at edge of surface. Cross LEFT leg over right leg (as pictured). You should feel a gentle stretch in the back of your left hip. Hold for 30 seconds, 4 times per day to address pain in back of left hip.

## 2015-08-13 NOTE — Therapy (Signed)
[facility] [address] [place], [place], [address] Phone: [phone]   Fax:  [phone]  Physical Therapy Treatment  Patient Details  Name: [name] [name] MRN: [id number] Date of Birth: 06/10/1946 No Data Recorded  Encounter Date: 08/13/2015      PT End of Session - 08/13/15 2039    Visit Number 8   Number of Visits 19   Date for PT Re-Evaluation 09/03/15   Authorization Type UHC Medicare - G Codes every 10 visits   PT Start Time 1018   PT Stop Time 1100   PT Time Calculation (min) 42 min   Equipment Utilized During Treatment Gait belt   Activity Tolerance Patient limited by fatigue  prolonged seated rest breaks required   Behavior During Therapy [facility] for tasks assessed/performed      Past Medical History  Diagnosis Date  . DIABETES MELLITUS, TYPE II 08/26/2006    Insulin dependent. Victoza added 03/2014  . Chronic pain syndrome 01/03/2013    Multifactorial. Non opioid requiring.   L2-5 fusion, with hardware at L2-3, stable per MRI 2010. Followed by neurosurgery (Dr. 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    [name] DEPRESSION 08/26/2006    On paxil    . DYSLIPIDEMIA 11/01/2006    LDL goal < 100, 70 if can achieve without side effects.     . OBESITY 08/26/2006    BMI 39.5. Obesity Class 2.     . GERD 08/26/2006    Heartburn QHS.     [name] PVD (peripheral vascular disease) ([place]) 03/27/2014    2015 LE arterial Duplex - Possible mild inflow disease on the left. 0-49% bilateral SFA disease, without focal stenosis. Three vesel run-off, bilaterally. Medical tx.   [name] CAD 08/26/2006    Cath 07/05:multiple areas of nonobstructive disease = medical & RF mgmt, well preserved global systolic function. Dr. Lia Foyer. Nuclear med stress test 01/2014 : Normal stress nuclear study. LV Ejection Fraction: 76%. LV Wall Motion: NL LV Function; NL Wall Motion.      [name]  HYPERTENSION 08/26/2006    On BB, ACEI, lasix, norvasc, metolazone, and imdur.       Past Surgical History  Procedure Laterality Date  . Posterior lumbar interfusion surgery  07/18/07    L2-L3  . Lumbar fusion surgery  10/08    L3-L5  . Rotator cuff surgery    . Cholecystectomy    . Endometrial biospy      There were no vitals filed for this visit.  Visit Diagnosis:  Unsteadiness  Generalized weakness      Subjective Assessment - 08/13/15 1022    Subjective Pt denies pain during PT session; reports no falls. Pt states, "I'm having trouble standing for more than just about 5 minutes at home. I have to stop because of pain in my (left) hip."   Patient is accompained by: Family member  daughter, [name]   Pertinent History * Pt unable to read. Daughter, [name], able to help with some basic reading. PMH: non-obstructive CAD, HTN, HLD, chronic diastolic CHF, LE claudication, DM 2, chronic back in lower back   Limitations Walking;House hold activities   Patient Stated Goals "Be able to stand and do some walking. I want to be able to go to the grocery store."   Currently in Pain? No/denies  [facility] Adult PT Treatment/Exercise - 08/13/15 0001    Transfers   Sit to Stand 7: Independent   Stand to Sit 7: Independent   Floor to Transfer 3: Mod assist;4: Min guard;5: Supervision   Floor to Transfer Details (indicate cue type and reason) Seated on mat table <> supine on floor (on mat) x3 trials total. Initial trial with mod A and max verbal/demonstration cueing for sequencing, technique (focus on transition from half kneeling <> tall kneeling <> side sitting). Required min guard, min cueing for second trial and supervision (for stability/balance), no cueing for final trial. During rest breaks, provided education on situations in which pt should attempt to get up vs. when to call EMS.   Ambulation/Gait   Ambulation/Gait Yes   Ambulation/Gait Assistance 5:  Supervision   Ambulation Distance (Feet) 140 Feet   Assistive device Straight cane   Gait Pattern Step-through pattern;Lateral hip instability;Decreased stance time - left;Lateral trunk lean to right   Ambulation Surface Level;Indoor   Gait velocity --   Posture/Postural Control   Posture/Postural Control --   Postural Limitations --   Posture Comments --   Exercises   Exercises Other Exercises   Other Exercises  To address pt-reported pain in lateral aspect of L hip, explained and demonstrated the following with effective return demonstration from pt: L TFL stretch in long sitting 2 x60-second holds; seated L gluteus medius stretch 4 x30-sec holds. Gluteus medius stretch added to HEP.                PT Education - 08/13/15 2026    Education provided Yes   Education Details Fall recovery, floor transfer. Added L gluteus medius stretch to HEP; see Pt Instructions.   Person(s) Educated Patient;Child(ren)   Methods Explanation;Demonstration;Verbal cues;Handout   Comprehension Verbalized understanding;Returned demonstration          PT Short Term Goals - 08/08/15 1112    PT SHORT TERM GOAL #1   Title Pt will perform home exercises with mod I using paper handout to indicate safe HEP compliance and to maximize functional gains made in PT. Target date: 08/06/15   Baseline Achieved 10/11   Status Achieved   PT SHORT TERM GOAL #2   Title Pt will increase 6MWT distance from 60' to 498' to indicate progress toward improved functional activity tolerance.  Target date: 08/06/15   Baseline 6MWT: 87' with SPC with seated rest break from 1:45 to 3:40. Pt. able to ambulate 709 ft. in six minutes.   Status Achieved   PT SHORT TERM GOAL #3   Title Pt will increase self-selected gait speed from 1.69 ft/sec to 1.99 ft/sec to indicate decreased risk of recurrent falls.  Target date: 08/06/15   Baseline 2.05 ft. per second is her new gait speed for 37mwalk test.   Status Achieved            PT Long Term Goals - 08/08/15 1102    PT LONG TERM GOAL #1   Title Pt will increase 6MWT distance from 423' to 573' to indicate significant improvement in functional endurance. Target date: 09/03/15   Baseline achieved 08/06/15   Status Achieved   PT LONG TERM GOAL #2   Title Pt will increase self-selected gait speed from 1.69 ft/sec to 2.29 ft/sec to indicate status of limited community ambulator. Target date: 09/03/15   Status On-going   PT LONG TERM GOAL #3   Title Pt will decrease 5x Sit To Stand time from 18.32 sec to </=  12.6 sec without UE use to indicate decreased fall risk, improved functional LE strength. Target date: 09/03/15   Status On-going   PT LONG TERM GOAL #4   Title Pt will ambulate x500' over unlevel, paved surfaces with mod I using LRAD to indicate increased safety with limited community mobility. Target date: 09/03/15   Baseline Pt. has achieved this goal minus the unlevel surfaces. She ambulated with and without cane for 566f. without rest break on indoor level surfaces with supervision.   Status Partially Met   PT LONG TERM GOAL #5   Title Pt will negotiate standard ramp and curb step with Mod I using LRAD to indicate increased safety traversing community obstacles. Target date: 09/03/15   Baseline Goal achieved: 10/27/16Pt. achieved this goal without assistive device. Advised to go up curb and steps leading with stronger R LE for increased safety.   Status Achieved   PT LONG TERM GOAL #6   Title Pt will perform floor transfer with mod I using standard chair to increase safety with fall recovery. Target date: 09/03/15   Baseline After fall in 12/2014, pt requires assist of family members to safely get up from floor.   Status On-going               Plan - 08/13/15 2027    Clinical Impression Statement Session focused on floor transfer for safe fal recovery. Pt demonstrated within-session improvement with floor transfer, requiring mod A for  intiial transfer and supervision for final transfer. Pt will continue to benefit from skilled outpatient PT to address functional impairments.   Pt will benefit from skilled therapeutic intervention in order to improve on the following deficits Decreased activity tolerance;Decreased endurance;Decreased balance;Decreased mobility;Decreased strength;Impaired sensation;Postural dysfunction;Pain;Decreased range of motion   Rehab Potential Good   Clinical Impairments Affecting Rehab Potential chronic pain   PT Frequency 2x / week   PT Duration 8 weeks   PT Treatment/Interventions ADLs/Self Care Home Management;Vestibular;Functional mobility training;Stair training;Gait training;DME Instruction;Therapeutic activities;Therapeutic exercise;Balance training;Neuromuscular re-education;Patient/family education;Orthotic Fit/Training   PT Next Visit Plan Continue standing balance and proximal LE strengthening (focus on hip ext/ABD). Consider manual lumbar traction, as radicular pain appears to be limiting activity tolerance.    Consulted and Agree with Plan of Care Patient;Family member/caregiver   Family Member Consulted daughter, DTamela Oddi       Problem List Patient Active Problem List   Diagnosis Date Noted  . Goals of care, counseling/discussion 01/18/2015  . ASCUS with positive high risk HPV 08/03/2014  . PVD (peripheral vascular disease) ([place] 03/27/2014  . Diastolic CHF, chronic ([place] 12/21/2013  . Healthcare maintenance 04/25/2013  . Chronic pain syndrome 01/03/2013  . Insomnia 12/22/2011  . Vitamin B12 deficiency 12/27/2009  . Vitamin D deficiency 12/27/2009  . Anemia 12/27/2009  . Hypomagnesemia 12/29/2008  . OSTEOPENIA 08/01/2008  . Hyperlipidemia 11/01/2006  . GLAUCOMA NOS 11/01/2006  . DM (diabetes mellitus), type 2, uncontrolled, periph vascular complic ([place] 142/59/5638 . Obesity 08/26/2006  . Depression 08/26/2006  . HTN (hypertension) 08/26/2006  . Coronary atherosclerosis  08/26/2006  . GERD 08/26/2006   BBillie Ruddy PT, DPT CTristar [facility]SBrazosGWhitewater NAlaska [address]Phone: [phone]  Fax:  [phone]/10/2014, 8:42 PM   Name: RPAYTEN HOBINMRN: [id number] of Birth: 610/03/1946

## 2015-08-15 ENCOUNTER — Other Ambulatory Visit: Payer: Self-pay | Admitting: Internal Medicine

## 2015-08-15 NOTE — Telephone Encounter (Signed)
Message sent to front office to schedule pt an appt. 

## 2015-08-16 ENCOUNTER — Ambulatory Visit: Payer: Medicare Other | Admitting: Physical Therapy

## 2015-08-16 ENCOUNTER — Encounter: Payer: Self-pay | Admitting: Physical Therapy

## 2015-08-16 DIAGNOSIS — R269 Unspecified abnormalities of gait and mobility: Secondary | ICD-10-CM

## 2015-08-16 DIAGNOSIS — R2681 Unsteadiness on feet: Secondary | ICD-10-CM

## 2015-08-16 DIAGNOSIS — R531 Weakness: Secondary | ICD-10-CM

## 2015-08-16 NOTE — Therapy (Signed)
[facility] [address] [place], [place], [address] Phone: [phone]   Fax:  [phone]  Physical Therapy Treatment  Patient Details  Name: [name] [name] MRN: [id number] Date of Birth: [birth year] No Data Recorded  Encounter Date: 08/16/2015      PT End of Session - 08/16/15 1053    Visit Number 9   Number of Visits 19   Date for PT Re-Evaluation 09/03/15   Authorization Type U[employer] Medicare - G Codes every 10 visits   PT Start Time 1015   PT Stop Time 1100   PT Time Calculation (min) 45 min   Equipment Utilized During Treatment Gait belt   Activity Tolerance Patient limited by fatigue  prolonged seated rest breaks required   Behavior During Therapy [facility] for tasks assessed/performed      Past Medical History  Diagnosis Date  . DIABETES MELLITUS, TYPE II 08/26/2006    Insulin dependent. Victoza added 03/2014  . Chronic pain syndrome 01/03/2013    Multifactorial. Non opioid requiring.   L2-5 fusion, with hardware at L2-3, stable per MRI 2010. Followed by neurosurgery (Dr. 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    [name] DEPRESSION 08/26/2006    On paxil    . DYSLIPIDEMIA 11/01/2006    LDL goal < 100, 70 if can achieve without side effects.     . OBESITY 08/26/2006    BMI 39.5. Obesity Class 2.     . GERD 08/26/2006    Heartburn QHS.     [name] PVD (peripheral vascular disease) ([place]) 03/27/2014    2015 LE arterial Duplex - Possible mild inflow disease on the left. 0-49% bilateral SFA disease, without focal stenosis. Three vesel run-off, bilaterally. Medical tx.   [name] CAD 08/26/2006    Cath 07/05:multiple areas of nonobstructive disease = medical & RF mgmt, well preserved global systolic function. Dr. Lia Foyer. Nuclear med stress test 01/2014 : Normal stress nuclear study. LV Ejection Fraction: 76%. LV Wall Motion: NL LV Function; NL Wall Motion.      [name]  HYPERTENSION 08/26/2006    On BB, ACEI, lasix, norvasc, metolazone, and imdur.       Past Surgical History  Procedure Laterality Date  . Posterior lumbar interfusion surgery  07/18/07    L2-L3  . Lumbar fusion surgery  10/08    L3-L5  . Rotator cuff surgery    . Cholecystectomy    . Endometrial biospy      There were no vitals filed for this visit.  Visit Diagnosis:  No diagnosis found.      Subjective Assessment - 08/16/15 1107    Subjective Pt. rates pain at 6/10 at beginning of session. Pt. report the pain shoots constantly throughout the day. During treatment pt. reported that pain decreased to 3/10 before manual lumbar traction. During and after lumbar traction pt reported that pain decreased to 2/10 without any shooting pain. Upon walking out of gym pt. was smiling because the pain wasw not shooting at that time. Pt told to monitor how long the pain relief lasts. "I still have trouble standing for long periods of time. I did go to the grocery store and walked with shopping cart for around 47mn.   Limitations Walking;House hold activities   Patient Stated Goals To be able to stand and walk with cane for 20 min. or more.   Currently in Pain? Yes   Pain Score 6  Pain Location Leg   Pain Orientation Left;Right   Pain Descriptors / Indicators Aching;Shooting   Pain Type Chronic pain   Pain Onset 1 to 4 weeks ago   Pain Frequency Constant   Aggravating Factors  weight bearing activity   Pain Relieving Factors resting, sitting   Multiple Pain Sites No     Therapeutic exercise: To increase balance and hip stability and strength to reduce risk of falls and increase activity tolerance. High marching and lateral walking on indoor, level surfaces with four 1/2 squats before turning around to go opposite direction. 3 laps each. Pt. Min guard assist with verbal cues not to hold at bottom of ROM of squat.  Hip extension with red Theraband: 1 set X15 each leg. Pt supervision with  parallel bar support and Verbal cues for proper form.  Hip abduction with yellow Theraband: 1 set X15 each leg. Pt. Supervision with parallel bar support and verbal cues to internally rotate hip to target correct muscles. SPTA tried red Theraband first, but pt had substitutions.   Neuro Re-ed: Standing balance on foam beam with parallel bar support. 3 sets, 30 sec each, eyes open, eyes open with small perturbations, eyes closed. Eyes closed method worked best to challenge pt. Pt. Contact guard assist with verbal cues to use parallel bars as needed.   Tandem walking on foam beam with parallel bar support. 3 laps forwards and backwards with mirror for visual feedback. Pt. Contact guard assist and intermittent assistance with parallel bars. Verbal cues to keep head and eyes up.    Manual lumbar traction with sheet. Pt able to bridge for sheet placement. Traction applied for 2, 1.5 min. Holds. Pt with decreased pain from 3/10 to 2/10 and shooting symptoms were relieved.           PT Education - 08/16/15 1139    Education provided Yes   Education Details Pt educated further on walking program. SPTA stressed the importance of daily walking in increments of 5 to 10 minutes using standing rest breaks PRN. Pt. reported walking with daughter down sidewalk and back. Sidewalk estimated at 300 yards round trip.          PT Short Term Goals - 08/16/15 1023    PT SHORT TERM GOAL #1   Title Pt will perform home exercises with mod I using paper handout to indicate safe HEP compliance and to maximize functional gains made in PT. Target date: 08/06/15   Baseline Achieved 10/11   Status Achieved   PT SHORT TERM GOAL #2   Title Pt will increase 6MWT distance from 31' to 498' to indicate progress toward improved functional activity tolerance.  Target date: 08/06/15   Baseline 6MWT: 101' with SPC with seated rest break from 1:45 to 3:40. Pt. able to ambulate 709 ft. in six minutes.   Status Achieved    PT SHORT TERM GOAL #3   Title Pt will increase self-selected gait speed from 1.69 ft/sec to 1.99 ft/sec to indicate decreased risk of recurrent falls.  Target date: 08/06/15   Baseline 2.05 ft. per second is her new gait speed for 90mwalk test.   Status Achieved           PT Long Term Goals - 08/16/15 1033    PT LONG TERM GOAL #1   Title Pt will increase 6MWT distance from 423' to 573' to indicate significant improvement in functional endurance. Target date: 09/03/15   Baseline achieved 08/06/15   Status Achieved  PT LONG TERM GOAL #2   Title Pt will increase self-selected gait speed from 1.69 ft/sec to 2.29 ft/sec to indicate status of limited community ambulator. Target date: 09/03/15   Status On-going   PT LONG TERM GOAL #3   Title Pt will decrease 5x Sit To Stand time from 18.32 sec to </= 12.6 sec without UE use to indicate decreased fall risk, improved functional LE strength. Target date: 09/03/15   Status On-going   PT LONG TERM GOAL #4   Title Pt will ambulate x500' over unlevel, paved surfaces with mod I using LRAD to indicate increased safety with limited community mobility. Target date: 09/03/15   Baseline --   Status Partially Met   PT LONG TERM GOAL #5   Title Pt will negotiate standard ramp and curb step with Mod I using LRAD to indicate increased safety traversing community obstacles. Target date: 09/03/15   Baseline Goal achieved: 10/27/16Pt. achieved this goal without assistive device. Advised to go up curb and steps leading with stronger R LE for increased safety.   Status Achieved   PT LONG TERM GOAL #6   Title Pt will perform floor transfer with mod I using standard chair to increase safety with fall recovery. Target date: 09/03/15   Baseline After fall in 12/2014, pt requires assist of family members to safely get up from floor.   Status On-going           Plan - 08/16/15 1107    Clinical Impression Statement Pt. is progressing to LTG's, Pt has achieved  LTG 4 minus the unlevel surfaces. Pt's pain is now decreasing during treatment [name] before and after lumbar traction. Pt. has met personal goal of grocery store ambulation.   Pt will benefit from skilled therapeutic intervention in order to improve on the following deficits Decreased activity tolerance;Decreased endurance;Decreased balance;Decreased mobility;Decreased strength;Impaired sensation;Postural dysfunction;Pain;Decreased range of motion   Rehab Potential Good   Clinical Impairments Affecting Rehab Potential chronic pain   PT Frequency 2x / week   PT Duration 8 weeks   PT Treatment/Interventions ADLs/Self Care Home Management;Vestibular;Functional mobility training;Stair training;Gait training;DME Instruction;Therapeutic activities;Therapeutic exercise;Balance training;Neuromuscular re-education;Patient/family education;Orthotic Fit/Training   PT Next Visit Plan Continue to address LTG's especially gait speed and curbs and ramps. Lumbar traction for pain centralization. Incorporate bridging into HEP, advance it as needed.   Consulted and Agree with Plan of Care Patient;Family member/caregiver   Family Member Consulted daughter, [name]        Problem List Patient Active Problem List   Diagnosis Date Noted  . Goals of care, counseling/discussion 01/18/2015  . ASCUS with positive high risk HPV 08/03/2014  . PVD (peripheral vascular disease) ([place]) 03/27/2014  . Diastolic CHF, chronic ([place]) 12/21/2013  . Healthcare maintenance 04/25/2013  . Chronic pain syndrome 01/03/2013  . Insomnia 12/22/2011  . Vitamin B12 deficiency 12/27/2009  . Vitamin D deficiency 12/27/2009  . Anemia 12/27/2009  . Hypomagnesemia 12/29/2008  . OSTEOPENIA 08/01/2008  . Hyperlipidemia 11/01/2006  . GLAUCOMA NOS 11/01/2006  . DM (diabetes mellitus), type 2, uncontrolled, periph vascular complic ([place]) 04/04/7627  . Obesity 08/26/2006  . Depression 08/26/2006  . HTN (hypertension) 08/26/2006  . Coronary  atherosclerosis 08/26/2006  . GERD 08/26/2006    Laney Potash 08/16/2015, 1:33 PM  Laney Potash, [place]  Name: [name] [name] MRN: [id number] Date of Birth: 12/13/1945  This note has been reviewed and edited by supervision CI.  Willow Ora, PTA, [facility] [address], [place],  [place] [address] [phone] 08/16/2015, 1:46 PM

## 2015-08-20 ENCOUNTER — Ambulatory Visit: Payer: Medicare Other | Admitting: Physical Therapy

## 2015-08-22 ENCOUNTER — Encounter: Payer: Self-pay | Admitting: Physical Therapy

## 2015-08-22 ENCOUNTER — Ambulatory Visit: Payer: Medicare Other | Admitting: Physical Therapy

## 2015-08-22 DIAGNOSIS — R269 Unspecified abnormalities of gait and mobility: Secondary | ICD-10-CM

## 2015-08-22 DIAGNOSIS — R2681 Unsteadiness on feet: Secondary | ICD-10-CM | POA: Diagnosis not present

## 2015-08-22 DIAGNOSIS — R531 Weakness: Secondary | ICD-10-CM

## 2015-08-22 NOTE — Therapy (Addendum)
[facility] [address] [place], [place], [address] Phone: [phone]   Fax:  [phone]  Physical Therapy Treatment  Patient Details  Name: [name] [name] MRN: [id number] Date of Birth: 22-Mar-1946 No Data Recorded  Encounter Date: 08/22/2015      PT End of Session - 08/22/15 1233    Visit Number 10   Number of Visits 19   Date for PT Re-Evaluation 09/03/15   Authorization Type UHC Medicare - G Codes every 10 visits   PT Start Time 1015   PT Stop Time 1100   PT Time Calculation (min) 45 min   Equipment Utilized During Treatment Gait belt   Activity Tolerance Patient limited by fatigue  prolonged seated rest breaks required   Behavior During Therapy [facility] for tasks assessed/performed      Past Medical History  Diagnosis Date  . DIABETES MELLITUS, TYPE II 08/26/2006    Insulin dependent. Victoza added 03/2014  . Chronic pain syndrome 01/03/2013    Multifactorial. Non opioid requiring.   L2-5 fusion, with hardware at L2-3, stable per MRI 2010. Followed by neurosurgery (Dr. 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    [name] DEPRESSION 08/26/2006    On paxil    . DYSLIPIDEMIA 11/01/2006    LDL goal < 100, 70 if can achieve without side effects.     . OBESITY 08/26/2006    BMI 39.5. Obesity Class 2.     . GERD 08/26/2006    Heartburn QHS.     [name] PVD (peripheral vascular disease) ([place]) 03/27/2014    2015 LE arterial Duplex - Possible mild inflow disease on the left. 0-49% bilateral SFA disease, without focal stenosis. Three vesel run-off, bilaterally. Medical tx.   [name] CAD 08/26/2006    Cath 07/05:multiple areas of nonobstructive disease = medical & RF mgmt, well preserved global systolic function. Dr. Lia Foyer. Nuclear med stress test 01/2014 : Normal stress nuclear study. LV Ejection Fraction: 76%. LV Wall Motion: NL LV Function; NL Wall Motion.      [name]  HYPERTENSION 08/26/2006    On BB, ACEI, lasix, norvasc, metolazone, and imdur.       Past Surgical History  Procedure Laterality Date  . Posterior lumbar interfusion surgery  07/18/07    L2-L3  . Lumbar fusion surgery  10/08    L3-L5  . Rotator cuff surgery    . Cholecystectomy    . Endometrial biospy      There were no vitals filed for this visit.  Visit Diagnosis:  Generalized weakness  Unsteadiness  Abnormality of gait  Weakness generalized      Subjective Assessment - 08/22/15 1224    Subjective Pt reports that pain relief from lumbar sheet traction lasted from last Friday's session through Saturday evening. Pt again reported today that the shooting pain stopped with traction. Pt reports that she went to store on Saturday and ambulated for an hour without intense pain..   Limitations Walking;House hold activities   Patient Stated Goals To be able to stand and walk with cane for 20 min. or more.   Currently in Pain? Yes   Pain Score 6    Pain Location Leg   Pain Orientation Right;Left   Pain Descriptors / Indicators Aching;Shooting   Pain Type Chronic pain   Pain Onset 1 to 4 weeks ago   Pain Frequency Constant   Aggravating Factors  weight bearing activity  Pain Relieving Factors resting, sitting   Effect of Pain on Daily Activities limits weight bearing activities   Multiple Pain Sites No          [facility] PT Assessment - 08/22/15 0001    6 minute walk test results    Aerobic Endurance Distance Walked 420  Pt unable to ambulate for 54mn. Pt ambulated 4287fin 3 min and 20 seconds      Therapeutic Activities: For pain management by centralizing pt's shooting LE pain. 3 sets of lumbar sheet traction, 12m37meach. Pt reported that shooting pain stopped after 15 sec of traction and pain was still relieved upon end of treatment.  NMR: To increase balance and reduce risk of falls. Pt min  assist with verbal and tactile cues for righting balance. Mirror was used  throughout for visWarden/rangeringle leg stance and tandem on foam beam in parallel bars with min guard assist. Single leg stance, 1 set 30 sec each side with eyes open. Tandem with 1, 30 sec set each way with eyes closed. Rocker board front and back 2 sets  X30 sec, 1 eyes open and 1 eyes closed. Rocker board side to side 2 sets X30 sec each, 1 eyes open and 1 eyes closed.  Therapeutic exercise: To strengthen core and hip extensors and abductors. Pt. Supervision with cues for controlled movement. Standing hip abduction: 1 set X30 reps each with red Theraband. Standing hip extension: 1 set X30 reps each with yellow Theraband. Pt completed 3 sets X10 reps of bridges with TA and pelvic floor contraction for increased strength of core and increased balance. Sit to stands: 2 sets X10 reps each.           PT Education - 08/22/15 1232    Education Details Pt educated on bridging and the benefits of strengthening core muscles for stability and pain management.   Person(s) Educated Patient   Methods Explanation   Comprehension Verbalized understanding          PT Short Term Goals - 08/22/15 1237    PT SHORT TERM GOAL #1   Title Pt will perform home exercises with mod I using paper handout to indicate safe HEP compliance and to maximize functional gains made in PT. Target date: 08/06/15   Baseline Achieved 10/11   Status Achieved   PT SHORT TERM GOAL #2   Title Pt will increase 6MWT distance from 4235o 498' to indicate progress toward improved functional activity tolerance.  Target date: 08/06/15   Baseline 6MWT: 42337ith SPC with seated rest break from 1:45 to 3:40. Pt. able to ambulate 709 ft. in six minutes.   Status Achieved   PT SHORT TERM GOAL #3   Title Pt will increase self-selected gait speed from 1.69 ft/sec to 1.99 ft/sec to indicate decreased risk of recurrent falls.  Target date: 08/06/15   Baseline 2.05 ft. per second is her new gait speed for 43m87mk test.   Status  Achieved           PT Long Term Goals - 08/22/15 1237    PT LONG TERM GOAL #1   Title Pt will increase 6MWT distance from 423' to 573' to indicate significant improvement in functional endurance. Target date: 09/03/15   Baseline achieved 08/06/15   Status Achieved   PT LONG TERM GOAL #2   Title Pt will increase self-selected gait speed from 1.69 ft/sec to 2.29 ft/sec to indicate status of limited community ambulator. Target date: 09/03/15  Status On-going   PT LONG TERM GOAL #3   Title Pt will decrease 5x Sit To Stand time from 18.32 sec to </= 12.6 sec without UE use to indicate decreased fall risk, improved functional LE strength. Target date: 09/03/15   Status On-going   PT LONG TERM GOAL #4   Title Pt will ambulate x500' over unlevel, paved surfaces with mod I using LRAD to indicate increased safety with limited community mobility. Target date: 09/03/15   Status Partially Met   PT LONG TERM GOAL #5   Title Pt will negotiate standard ramp and curb step with Mod I using LRAD to indicate increased safety traversing community obstacles. Target date: 09/03/15   Baseline Goal achieved: 10/27/16Pt. achieved this goal without assistive device. Advised to go up curb and steps leading with stronger R LE for increased safety.   Status Achieved   PT LONG TERM GOAL #6   Title Pt will perform floor transfer with mod I using standard chair to increase safety with fall recovery. Target date: 09/03/15   Baseline After fall in 12/2014, pt requires assist of family members to safely get up from floor.   Status On-going               Plan - 08/22/15 1234    Clinical Impression Statement Pt is progressing to LTG's, but is still limited primarily in hip strength and activity tolerance. Pt unable to complete 6MWT (see treatment).   Pt will benefit from skilled therapeutic intervention in order to improve on the following deficits Decreased activity tolerance;Decreased endurance;Decreased  balance;Decreased mobility;Decreased strength;Impaired sensation;Postural dysfunction;Pain;Decreased range of motion   Rehab Potential Good   Clinical Impairments Affecting Rehab Potential chronic pain   PT Frequency 2x / week   PT Duration 8 weeks   PT Treatment/Interventions ADLs/Self Care Home Management;Vestibular;Functional mobility training;Stair training;Gait training;DME Instruction;Therapeutic activities;Therapeutic exercise;Balance training;Neuromuscular re-education;Patient/family education;Orthotic Fit/Training   PT Next Visit Plan Continue emphasizing hip and core strengthening, and increasing activity tolerance with gait.   Consulted and Agree with Plan of Care Patient;Family member/caregiver       Problem List Patient Active Problem List   Diagnosis Date Noted  . Goals of care, counseling/discussion 01/18/2015  . ASCUS with positive high risk HPV 08/03/2014  . PVD (peripheral vascular disease) ([place]) 03/27/2014  . Diastolic CHF, chronic ([place]) 12/21/2013  . Healthcare maintenance 04/25/2013  . Chronic pain syndrome 01/03/2013  . Insomnia 12/22/2011  . Vitamin B12 deficiency 12/27/2009  . Vitamin D deficiency 12/27/2009  . Anemia 12/27/2009  . Hypomagnesemia 12/29/2008  . OSTEOPENIA 08/01/2008  . Hyperlipidemia 11/01/2006  . GLAUCOMA NOS 11/01/2006  . DM (diabetes mellitus), type 2, uncontrolled, periph vascular complic ([place]) 16/07/9603  . Obesity 08/26/2006  . Depression 08/26/2006  . HTN (hypertension) 08/26/2006  . Coronary atherosclerosis 08/26/2006  . GERD 08/26/2006    [name] [name] 08/22/2015, 12:50 PM   [name] [name], [place]  Name: [name] [name] MRN: [id number] Date of Birth: 1946/06/19  This note has been reviewed and edited by supervising CI.  Willow Ora, PTA, [facility] [address] [place], [place] [address] [phone] 08/23/2015, 10:25 AM     Addendum by Billie Ruddy, PT, DPT [facility] [address] [place], [place], [address] Phone: [phone]   Fax:  [phone] 09-16-15, 2:49 PM  G Codes Functional Assessment Tool: 6MWT distance = 709' (on 08/06/15) Functional Limitation:  Mobility: Walking and Moving Around Current Status [phone]): CK Goal  Status [phone]): CJ   .Physical Therapy Progress Note  Dates of Reporting Period: 07/09/15 to 08/22/15  Objective Reports of Subjective Statement: When asked about perceived functional improvement, pt reports feeling more confident with walking outdoors and getting up from floor by herself.  Objective Measurements: 6MWT, 5x STS, gait velocity  Goal Update: See above.  Plan: Continue per POC.  Reason Skilled Services are Required: To maximize stability/independence with functional mobility, decrease fall risk.

## 2015-08-23 ENCOUNTER — Ambulatory Visit: Payer: Medicare Other | Admitting: Physical Therapy

## 2015-08-23 DIAGNOSIS — R2681 Unsteadiness on feet: Secondary | ICD-10-CM | POA: Diagnosis not present

## 2015-08-23 DIAGNOSIS — R269 Unspecified abnormalities of gait and mobility: Secondary | ICD-10-CM

## 2015-08-23 DIAGNOSIS — R531 Weakness: Secondary | ICD-10-CM

## 2015-08-23 NOTE — Patient Instructions (Signed)
Walking Program:  Begin walking for exercise for 6 and 1/2 minutes, 1-2 time/day, 5 days/week.    Progress your walking program by adding 30 seconds to your routine each week, as tolerated.  Be sure to wear good walking shoes, use your cane, walk in a safe environment and only progress to your tolerance.      Try to pace your breathing (3 steps while breathing in, 3 steps while breathing out) while walking.

## 2015-08-24 NOTE — Therapy (Signed)
[facility] [address] [place], [place], [address] Phone: [phone]   Fax:  [phone]  Physical Therapy Treatment  Patient Details  Name: [name] [name] MRN: [id number] Date of Birth: 02/15/1946 No Data Recorded  Encounter Date: 08/23/2015      PT End of Session - 08/24/15 1525    Visit Number 11   Number of Visits 19   Date for PT Re-Evaluation 09/03/15   Authorization Type [employer] Medicare - G Codes every 10 visits   PT Start Time 3358   PT Stop Time 1102   PT Time Calculation (min) 44 min   Activity Tolerance Patient limited by fatigue  frequent rest breaks required   Behavior During Therapy [facility] for tasks assessed/performed      Past Medical History  Diagnosis Date  . DIABETES MELLITUS, TYPE II 08/26/2006    Insulin dependent. Victoza added 03/2014  . Chronic pain syndrome 01/03/2013    Multifactorial. Non opioid requiring.   L2-5 fusion, with hardware at L2-3, stable per MRI 2010. Followed by neurosurgery (Dr. 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    [name] DEPRESSION 08/26/2006    On paxil    . DYSLIPIDEMIA 11/01/2006    LDL goal < 100, 70 if can achieve without side effects.     . OBESITY 08/26/2006    BMI 39.5. Obesity Class 2.     . GERD 08/26/2006    Heartburn QHS.     [name] PVD (peripheral vascular disease) ([place]) 03/27/2014    2015 LE arterial Duplex - Possible mild inflow disease on the left. 0-49% bilateral SFA disease, without focal stenosis. Three vesel run-off, bilaterally. Medical tx.   [name] CAD 08/26/2006    Cath 07/05:multiple areas of nonobstructive disease = medical & RF mgmt, well preserved global systolic function. Dr. Lia Foyer. Nuclear med stress test 01/2014 : Normal stress nuclear study. LV Ejection Fraction: 76%. LV Wall Motion: NL LV Function; NL Wall Motion.      [name] HYPERTENSION 08/26/2006    On BB, ACEI, lasix, norvasc,  metolazone, and imdur.       Past Surgical History  Procedure Laterality Date  . Posterior lumbar interfusion surgery  07/18/07    L2-L3  . Lumbar fusion surgery  10/08    L3-L5  . Rotator cuff surgery    . Cholecystectomy    . Endometrial biospy      There were no vitals filed for this visit.  Visit Diagnosis:  Generalized weakness  Abnormality of gait      Subjective Assessment - 08/23/15 1023    Subjective Pt reporting feeling "not great" today. Upon further questioning, pt shared that brother passed away and sister died on 2022-12-31. Pt would like to participate in PT today to "keep busy". Pt feels as though she is "close" to being ready to finish PT. Continues to report limited endurance.   Limitations Walking;House hold activities   Patient Stated Goals To be able to stand and walk with cane for 20 min. or more.   Currently in Pain? Yes   Pain Score 6    Pain Location Foot   Pain Orientation Right   Pain Descriptors / Indicators Aching;Shooting   Pain Type Chronic pain   Pain Onset More than a month ago   Pain Frequency Intermittent   Aggravating Factors  walking   Pain Relieving Factors sitting, resting   Multiple Pain Sites No  [facility] PT Assessment - 08/24/15 0001    6 minute walk test results    Aerobic Endurance Distance Walked 686  using SPC   Endurance additional comments Seated rest break from 3.5 minutes to 5 minutes.                             PT Education - 08/24/15 1519    Education provided Yes   Education Details Progress toward LTG's. Walking program to increase functional endurance, walking tolerance.   Person(s) Educated Patient   Methods Explanation;Handout   Comprehension Verbalized understanding          PT Short Term Goals - 08/22/15 1237    PT SHORT TERM GOAL #1   Title Pt will perform home exercises with mod I using paper handout to indicate safe HEP compliance and to maximize functional gains  made in PT. Target date: 08/06/15   Baseline Achieved 10/11   Status Achieved   PT SHORT TERM GOAL #2   Title Pt will increase 6MWT distance from 16' to 498' to indicate progress toward improved functional activity tolerance.  Target date: 08/06/15   Baseline 6MWT: 66' with SPC with seated rest break from 1:45 to 3:40. Pt. able to ambulate 709 ft. in six minutes.   Status Achieved   PT SHORT TERM GOAL #3   Title Pt will increase self-selected gait speed from 1.69 ft/sec to 1.99 ft/sec to indicate decreased risk of recurrent falls.  Target date: 08/06/15   Baseline 2.05 ft. per second is her new gait speed for 28mwalk test.   Status Achieved           PT Long Term Goals - 08/23/15 1026    PT LONG TERM GOAL #1   Title Pt will increase 6MWT distance from 423' to 573' to indicate significant improvement in functional endurance. Target date: 09/03/15   Baseline achieved 08/06/15   Status Achieved   PT LONG TERM GOAL #2   Title Pt will increase self-selected gait speed from 1.69 ft/sec to 2.29 ft/sec to indicate status of limited community ambulator. Target date: 09/03/15   Baseline 11/11: gait velocity = 2.56 ft/sec   Status Achieved   PT LONG TERM GOAL #3   Title Pt will decrease 5x Sit To Stand time from 18.32 sec to </= 12.6 sec without UE use to indicate decreased fall risk, improved functional LE strength. Target date: 09/03/15   Baseline Met 11/11 with 5x STS time 11.85 seconds   Status Achieved   PT LONG TERM GOAL #4   Title Pt will ambulate x500' over unlevel, paved surfaces with mod I using LRAD to indicate increased safety with limited community mobility. Target date: 09/03/15   Baseline 11/11: Mod I with SPC for initial 300' outdoors prior to requiring supervision due intermittent to L toe drag   Status On-going   PT LONG TERM GOAL #5   Title Pt will negotiate standard ramp and curb step with Mod I using LRAD to indicate increased safety traversing community obstacles.  Target date: 09/03/15   Baseline Goal achieved: 10/27/16Pt. achieved this goal without assistive device. Advised to go up curb and steps leading with stronger R LE for increased safety.   Status Achieved   PT LONG TERM GOAL #6   Title Pt will perform floor transfer with mod I using standard chair to increase safety with fall recovery. Target date: 09/03/15   Baseline Met 11/11  Status Achieved               Plan - 08/24/15 1526    Clinical Impression Statement Pt has met 5 of 6 LTG's, suggesting improved efficiency of ambulation, decreased fall risk, improved functional LE strength, and HEP compliance. Pt safety with limited community ambulation limited by fatigue after ambulating x300' outdoors. Walking program initiated to address functional endurance. ambulation tolerance.    Pt will benefit from skilled therapeutic intervention in order to improve on the following deficits Decreased activity tolerance;Decreased endurance;Decreased balance;Decreased mobility;Decreased strength;Impaired sensation;Postural dysfunction;Pain;Decreased range of motion   Rehab Potential Good   Clinical Impairments Affecting Rehab Potential chronic pain   PT Frequency 2x / week   PT Duration 8 weeks   PT Next Visit Plan Address remaining LTG for community mobility. May benefit from exercise to increase muscular endurance in ankle DF to prevent toe drag with fatigue.   Consulted and Agree with Plan of Care Patient        Problem List Patient Active Problem List   Diagnosis Date Noted  . Goals of care, counseling/discussion 01/18/2015  . ASCUS with positive high risk HPV 08/03/2014  . PVD (peripheral vascular disease) ([place]) 03/27/2014  . Diastolic CHF, chronic ([place]) 12/21/2013  . Healthcare maintenance 04/25/2013  . Chronic pain syndrome 01/03/2013  . Insomnia 12/22/2011  . Vitamin B12 deficiency 12/27/2009  . Vitamin D deficiency 12/27/2009  . Anemia 12/27/2009  . Hypomagnesemia 12/29/2008  .  OSTEOPENIA 08/01/2008  . Hyperlipidemia 11/01/2006  . GLAUCOMA NOS 11/01/2006  . DM (diabetes mellitus), type 2, uncontrolled, periph vascular complic ([place]) 06/89/3406  . Obesity 08/26/2006  . Depression 08/26/2006  . HTN (hypertension) 08/26/2006  . Coronary atherosclerosis 08/26/2006  . GERD 08/26/2006    Billie Ruddy, PT, DPT [facility] [address] [place], [place], [address] Phone: [phone]   Fax:  [phone] 08/24/2015, 3:29 PM   Name: [name] [name] MRN: [id number] Date of Birth: [birth year]

## 2015-08-27 ENCOUNTER — Ambulatory Visit: Payer: Medicare Other | Admitting: Physical Therapy

## 2015-08-29 ENCOUNTER — Ambulatory Visit: Payer: Medicare Other | Admitting: Physical Therapy

## 2015-09-02 ENCOUNTER — Encounter: Payer: Self-pay | Admitting: Physical Therapy

## 2015-09-02 ENCOUNTER — Ambulatory Visit: Payer: Medicare Other | Admitting: Physical Therapy

## 2015-09-02 DIAGNOSIS — R2681 Unsteadiness on feet: Secondary | ICD-10-CM

## 2015-09-02 DIAGNOSIS — R269 Unspecified abnormalities of gait and mobility: Secondary | ICD-10-CM

## 2015-09-02 DIAGNOSIS — R531 Weakness: Secondary | ICD-10-CM

## 2015-09-02 NOTE — Therapy (Addendum)
[facility] [address] [place], [place], [address] Phone: [phone]   Fax:  [phone]  Physical Therapy Treatment  Patient Details  Name: [name] [name] MRN: [id number] Date of Birth: 03-31-1946 No Data Recorded  Encounter Date: 09/02/2015      PT End of Session - 09/02/15 1055    Visit Number 12   Number of Visits 19   Date for PT Re-Evaluation 09/03/15   Authorization Type UHC Medicare - G Codes every 10 visits   PT Start Time 0762   PT Stop Time 1100   PT Time Calculation (min) 45 min   Activity Tolerance Patient limited by fatigue  frequent rest breaks required   Behavior During Therapy [facility] for tasks assessed/performed      Past Medical History  Diagnosis Date  . DIABETES MELLITUS, TYPE II 08/26/2006    Insulin dependent. Victoza added 03/2014  . Chronic pain syndrome 01/03/2013    Multifactorial. Non opioid requiring.   L2-5 fusion, with hardware at L2-3, stable per MRI 2010. Followed by neurosurgery (Dr. 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    [name] DEPRESSION 08/26/2006    On paxil    . DYSLIPIDEMIA 11/01/2006    LDL goal < 100, 70 if can achieve without side effects.     . OBESITY 08/26/2006    BMI 39.5. Obesity Class 2.     . GERD 08/26/2006    Heartburn QHS.     [name] PVD (peripheral vascular disease) ([place]) 03/27/2014    2015 LE arterial Duplex - Possible mild inflow disease on the left. 0-49% bilateral SFA disease, without focal stenosis. Three vesel run-off, bilaterally. Medical tx.   [name] CAD 08/26/2006    Cath 07/05:multiple areas of nonobstructive disease = medical & RF mgmt, well preserved global systolic function. Dr. Lia Foyer. Nuclear med stress test 01/2014 : Normal stress nuclear study. LV Ejection Fraction: 76%. LV Wall Motion: NL LV Function; NL Wall Motion.      [name] HYPERTENSION 08/26/2006    On BB, ACEI, lasix, norvasc,  metolazone, and imdur.       Past Surgical History  Procedure Laterality Date  . Posterior lumbar interfusion surgery  07/18/07    L2-L3  . Lumbar fusion surgery  10/08    L3-L5  . Rotator cuff surgery    . Cholecystectomy    . Endometrial biospy      There were no vitals filed for this visit.  Visit Diagnosis:   Generalized weakness     Abnormality of gait     Unsteadiness     Weakness generalized         Subjective Assessment - 09/02/15 1023    Subjective Pt reports 7/10 pain In L UE and L LE. Pt reports falling yesterday in home walking down the hallway without an AD. [name] Pt not sure how she tripped. Pt landed on L UE and reports 7/10 pain. Incident reported to PT. PT cleared pt for therapy and requested DGI to be performed to look at balance. After traction pt reported 2/10 pain in left L LE.   Limitations Walking;House hold activities   Patient Stated Goals To be able to stand and walk with cane for 20 min. or more.   Currently in Pain? Yes   Pain Score 7    Pain Location Leg   Pain Orientation Left   Pain Descriptors / Indicators Aching;Shooting   Pain  Type Chronic pain   Pain Onset More than a month ago   Pain Frequency Intermittent   Aggravating Factors  walking   Pain Relieving Factors sitting, resting   Effect of Pain on Daily Activities limits weight bearing activities   Multiple Pain Sites Yes   Pain Score 7   Pain Location Arm   Pain Orientation Left   Pain Descriptors / Indicators Aching   Pain Onset Yesterday   Aggravating Factors  Pt fell on left arm yesterday.   Effect of Pain on Daily Activities Pt able to achieve 160 degrees of shoulder flexion without pain or limitation. Pt does have foot drop on the left that could contribute to her falling.     Therapeutic Exercise: To increase pt's activity tolerance and increase dorsiflexion during gait to reduce risk of falls.   1. Gait: Pt ambulated 500 ft on indoor, level surfaces with single point cane  with supervision assist with no verbal cues. Pt realizes when she is fatigued and seeks rest as needed.  2. Dorsiflexion X30 reps each LE with yellow Theraband resistance. Pt supervision with exercise with verbal cues for initial instruction.   Therapeutic Activities: To decrease pain in L LE and to increase pt's functional independence.  1. Lumbar sheet traction: SPTA performed 2 reps X2 min each. Pt reported that pain localized within 30 sec of initializing traction.       [facility] PT Treatment/Exercise - 09/02/15 1038    Dynamic Gait Index   Level Surface Mild Impairment   Change in Gait Speed Mild Impairment   Gait with Horizontal Head Turns Mild Impairment   Gait with Vertical Head Turns Mild Impairment   Gait and Pivot Turn Mild Impairment   Step Over Obstacle Normal   Step Around Obstacles Normal   Steps Mild Impairment   Total Score 18            PT Education - 09/02/15 1048    Education provided Yes   Education Details Pt. educated on dorsiflexion exercise with yellow Theraband for dorsiflexor strengthening  to reduce risk of falls.   Person(s) Educated Patient   Methods Explanation   Comprehension Verbalized understanding;Returned demonstration          PT Short Term Goals - 09/02/15 1358    PT SHORT TERM GOAL #1   Title Pt will perform home exercises with mod I using paper handout to indicate safe HEP compliance and to maximize functional gains made in PT. Target date: 08/06/15   Baseline Achieved 10/11   Status Achieved   PT SHORT TERM GOAL #2   Title Pt will increase 6MWT distance from 32' to 498' to indicate progress toward improved functional activity tolerance.  Target date: 08/06/15   Baseline 6MWT: 46' with SPC with seated rest break from 1:45 to 3:40. Pt. able to ambulate 709 ft. in six minutes.   Status Achieved   PT SHORT TERM GOAL #3   Title Pt will increase self-selected gait speed from 1.69 ft/sec to 1.99 ft/sec to indicate decreased risk  of recurrent falls.  Target date: 08/06/15   Baseline 2.05 ft. per second is her new gait speed for 60mwalk test.   Status Achieved           PT Long Term Goals - 09/02/15 1358    PT LONG TERM GOAL #1   Title Pt will increase 6MWT distance from 423' to 573' to indicate significant improvement in functional endurance. Target date: 09/03/15  Baseline achieved 08/06/15   Status Achieved   PT LONG TERM GOAL #2   Title Pt will increase self-selected gait speed from 1.69 ft/sec to 2.29 ft/sec to indicate status of limited community ambulator. Target date: 09/03/15   Baseline 11/11: gait velocity = 2.56 ft/sec   Status Achieved   PT LONG TERM GOAL #3   Title Pt will decrease 5x Sit To Stand time from 18.32 sec to </= 12.6 sec without UE use to indicate decreased fall risk, improved functional LE strength. Target date: 09/03/15   Baseline Met 11/11 with 5x STS time 11.85 seconds   Status Achieved   PT LONG TERM GOAL #4   Title Pt will ambulate x500' over unlevel, paved surfaces with mod I using LRAD to indicate increased safety with limited community mobility. Target date: 09/03/15   Baseline 11/11: Mod I with SPC for initial 300' outdoors prior to requiring supervision due intermittent to L toe drag   Status On-going   PT LONG TERM GOAL #5   Title Pt will negotiate standard ramp and curb step with Mod I using LRAD to indicate increased safety traversing community obstacles. Target date: 09/03/15   Baseline Goal achieved: 10/27/16Pt. achieved this goal without assistive device. Advised to go up curb and steps leading with stronger R LE for increased safety.   Status Achieved   PT LONG TERM GOAL #6   Title Pt will perform floor transfer with mod I using standard chair to increase safety with fall recovery. Target date: 09/03/15   Baseline Met 11/11   Status Achieved          Plan - 09/02/15 1055    Clinical Impression Statement Pt still progressing towards LTG 4. Pt ambulated 500 ft.  on indoor, level surfaces. Pt's activity tolerance increased significantly with single tip cane. Pt realizes the necessity of using the cane and always uses it outside of the home.  Pt's pain significantly reduced using lumbar sheet traction.    Pt will benefit from skilled therapeutic intervention in order to improve on the following deficits Decreased activity tolerance;Decreased endurance;Decreased balance;Decreased mobility;Decreased strength;Impaired sensation;Postural dysfunction;Pain;Decreased range of motion   Rehab Potential Good   Clinical Impairments Affecting Rehab Potential chronic pain   PT Frequency 2x / week   PT Duration 8 weeks   PT Next Visit Plan Address remaining LTG for community mobility. Fall prevention strategies, discuss use of cane at all times to prevent falls. HEP to address any deficits identified by DGI this session. TUG/10 meter with/without AD to determine any differences. Discuss discharge vs renewal with pt/PT after all the above.    Consulted and Agree with Plan of Care Patient        Problem List Patient Active Problem List   Diagnosis Date Noted  . Goals of care, counseling/discussion 01/18/2015  . ASCUS with positive high risk HPV 08/03/2014  . PVD (peripheral vascular disease) ([place]) 03/27/2014  . Diastolic CHF, chronic ([place]) 12/21/2013  . Healthcare maintenance 04/25/2013  . Chronic pain syndrome 01/03/2013  . Insomnia 12/22/2011  . Vitamin B12 deficiency 12/27/2009  . Vitamin D deficiency 12/27/2009  . Anemia 12/27/2009  . Hypomagnesemia 12/29/2008  . OSTEOPENIA 08/01/2008  . Hyperlipidemia 11/01/2006  . GLAUCOMA NOS 11/01/2006  . DM (diabetes mellitus), type 2, uncontrolled, periph vascular complic ([place]) 37/62/8315  . Obesity 08/26/2006  . Depression 08/26/2006  . HTN (hypertension) 08/26/2006  . Coronary atherosclerosis 08/26/2006  . GERD 08/26/2006    [place]aney [place]ash 09/02/2015, 4:50 PM  [place], [place]  Name: [name] [name] MRN: [id number] Date of Birth: 1945-11-27  This note has been reviewed and edited by supervising CI.  Willow Ora, PTA, [facility] [address] [place], [place] [address] [phone] 09/03/2015, 8:33 AM    Addendum: Aware of fall.  Billie Ruddy, PT, DPT [facility] [address] [place], [place], [address] Phone: [phone]   Fax:  [phone] 09/03/2015, 9:25 AM

## 2015-09-03 ENCOUNTER — Ambulatory Visit: Payer: Medicare Other | Admitting: Physical Therapy

## 2015-09-03 ENCOUNTER — Encounter: Payer: Self-pay | Admitting: Physical Therapy

## 2015-09-03 DIAGNOSIS — R531 Weakness: Secondary | ICD-10-CM

## 2015-09-03 DIAGNOSIS — R269 Unspecified abnormalities of gait and mobility: Secondary | ICD-10-CM

## 2015-09-03 DIAGNOSIS — R2681 Unsteadiness on feet: Secondary | ICD-10-CM

## 2015-09-03 NOTE — Therapy (Signed)
[facility] [address] [place], [place], [address] Phone: [phone]   Fax:  [phone]  Physical Therapy Treatment  Patient Details  Name: [name] [name] MRN: [id number] Date of Birth: 07-22-1946 No Data Recorded  Encounter Date: 09/03/2015      PT End of Session - 09/03/15 1020    Visit Number 13   Number of Visits 19   Date for PT Re-Evaluation 09/03/15   Authorization Type [employer] Medicare - G Codes every 10 visits   PT Start Time 1016   PT Stop Time 1100   PT Time Calculation (min) 44 min   Equipment Utilized During Treatment Gait belt   Activity Tolerance Patient tolerated treatment well  frequent rest breaks required   Behavior During Therapy WFL for tasks assessed/performed      Past Medical History  Diagnosis Date  . DIABETES MELLITUS, TYPE II 08/26/2006    Insulin dependent. Victoza added 03/2014  . Chronic pain syndrome 01/03/2013    Multifactorial. Non opioid requiring.   L2-5 fusion, with hardware at L2-3, stable per MRI 2010. Followed by neurosurgery (Dr. 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    [name] DEPRESSION 08/26/2006    On paxil    . DYSLIPIDEMIA 11/01/2006    LDL goal < 100, 70 if can achieve without side effects.     . OBESITY 08/26/2006    BMI 39.5. Obesity Class 2.     . GERD 08/26/2006    Heartburn QHS.     [name] PVD (peripheral vascular disease) ([facility]) 03/27/2014    2015 LE arterial Duplex - Possible mild inflow disease on the left. 0-49% bilateral SFA disease, without focal stenosis. Three vesel run-off, bilaterally. Medical tx.   [name] CAD 08/26/2006    Cath 07/05:multiple areas of nonobstructive disease = medical & RF mgmt, well preserved global systolic function. Dr. Lia Foyer. Nuclear med stress test 01/2014 : Normal stress nuclear study. LV Ejection Fraction: 76%. LV Wall Motion: NL LV Function; NL Wall Motion.      [name]  HYPERTENSION 08/26/2006    On BB, ACEI, lasix, norvasc, metolazone, and imdur.       Past Surgical History  Procedure Laterality Date  . Posterior lumbar interfusion surgery  07/18/07    L2-L3  . Lumbar fusion surgery  10/08    L3-L5  . Rotator cuff surgery    . Cholecystectomy    . Endometrial biospy      There were no vitals filed for this visit.  Visit Diagnosis:  Generalized weakness  Abnormality of gait  Unsteadiness  Weakness generalized      Subjective Assessment - 09/03/15 1018    Subjective No new compliants. No falls since last visit. Left shoulder feeling a little better today.   Pertinent History * Pt unable to read. Daughter, [name] [name], able to help with some basic reading. PMH: non-obstructive CAD, HTN, HLD, chronic diastolic CHF, LE claudication, DM 2, chronic back in lower back   Limitations Walking;House hold activities   Currently in Pain? Yes   Pain Score 6    Pain Location Shoulder   Pain Orientation Left   Pain Descriptors / Indicators Aching;Sore   Pain Type Chronic pain   Pain Onset More than a month ago   Pain Frequency Intermittent   Aggravating Factors  lying on it   Pain Relieving Factors rest, not moving it  [facility] Adult PT Treatment/Exercise - 09/03/15 1021    Ambulation/Gait   Ambulation/Gait Yes   Ambulation/Gait Assistance 6: Modified independent (Device/Increase time)   Ambulation/Gait Assistance Details slight gait deviations continue to be present. no loss of balance or toe scuffing noted today with gait   Ambulation Distance (Feet) 500 Feet   Assistive device Straight cane   Gait Pattern Step-through pattern;Lateral hip instability;Decreased stance time - left;Lateral trunk lean to right   Ambulation Surface Level;Unlevel;Indoor;Outdoor;Paved;Gravel;Grass   Gait velocity 15.94 sec's= 2.06 ft/sec with cane; 14.81 sec's = 2.21   Gait velocity - backwards --   Dynamic Gait Index   Level Surface Normal   Change in Gait Speed  Mild Impairment   Gait with Horizontal Head Turns Normal   Gait with Vertical Head Turns Normal   Gait and Pivot Turn Normal   Step Over Obstacle Normal   Step Around Obstacles Normal   Steps Mild Impairment   Total Score 22   Timed Up and Go Test   TUG Normal TUG   Normal TUG (seconds) 9.41  no AD used     Self Care: Educated on fall prevention strategies. Handout issued as well.  Neuro Re-ed: Educated on dynamic balance activities at countertop: gait forward/backards with head nods and head turns. 3-4 laps each/each way with cues on ex form and to decrease frequency of head movements. Pt able to return demo of proper technique by end of reps.           PT Education - 09/03/15 1045    Education provided Yes   Education Details fall prevention strategies; HEP: gait along counter top with head turns/nods forward/backwards; to follow up with MD on continued back/hip pain and have it addressed as needed.   Person(s) Educated Patient   Methods Explanation;Demonstration;Verbal cues;Handout   Comprehension Verbalized understanding;Returned demonstration          PT Short Term Goals - 09/02/15 1358    PT SHORT TERM GOAL #1   Title Pt will perform home exercises with mod I using paper handout to indicate safe HEP compliance and to maximize functional gains made in PT. Target date: 08/06/15   Baseline Achieved 10/11   Status Achieved   PT SHORT TERM GOAL #2   Title Pt will increase 6MWT distance from 71' to 498' to indicate progress toward improved functional activity tolerance.  Target date: 08/06/15   Baseline 6MWT: 57' with SPC with seated rest break from 1:45 to 3:40. Pt. able to ambulate 709 ft. in six minutes.   Status Achieved   PT SHORT TERM GOAL #3   Title Pt will increase self-selected gait speed from 1.69 ft/sec to 1.99 ft/sec to indicate decreased risk of recurrent falls.  Target date: 08/06/15   Baseline 2.05 ft. per second is her new gait speed for 76mwalk  test.   Status Achieved           PT Long Term Goals - 09/03/15 1033    PT LONG TERM GOAL #1   Title Pt will increase 6MWT distance from 423' to 573' to indicate significant improvement in functional endurance. Target date: 09/03/15   Baseline achieved 08/06/15   Status Achieved   PT LONG TERM GOAL #2   Title Pt will increase self-selected gait speed from 1.69 ft/sec to 2.29 ft/sec to indicate status of limited community ambulator. Target date: 09/03/15   Baseline 11/11: gait velocity = 2.56 ft/sec   Status Achieved   PT LONG TERM GOAL #  3   Title Pt will decrease 5x Sit To Stand time from 18.32 sec to </= 12.6 sec without UE use to indicate decreased fall risk, improved functional LE strength. Target date: 24-Sep-2015   Baseline Met 11/11 with 5x STS time 11.85 seconds   Status Achieved   PT LONG TERM GOAL #4   Title Pt will ambulate x500' over unlevel, paved surfaces with mod I using LRAD to indicate increased safety with limited community mobility. Target date: 2015/09/24   Baseline on September 24, 2015   Status Achieved   PT LONG TERM GOAL #5   Title Pt will negotiate standard ramp and curb step with Mod I using LRAD to indicate increased safety traversing community obstacles. Target date: 09-24-15   Baseline Goal achieved: 10/27/16Pt. achieved this goal without assistive device. Advised to go up curb and steps leading with stronger R LE for increased safety.   Status Achieved   PT LONG TERM GOAL #6   Title Pt will perform floor transfer with mod I using standard chair to increase safety with fall recovery. Target date: 2015/09/24   Baseline Met 11/11   Status Achieved            Plan - Sep 24, 2015 1021    Clinical Impression Statement Pt has met all LTG's. Advanced HEP to include dynamic balance activities without any issues reported. Pt with improved gait speed and Dynamic Gait Index scores today. Pt did report that yesterday her left hip pain was 8/10 until after manual sheet traction,  which decreased it significantly. Today she reports no hip/back pain. Pt only see's her primary Md at this time. Advised pt to follow up with this Md reguarding the pain as it appears to limiit her mobilty/balance acoording to objective tests performed in past two sessions. Pt agreed to this. Pt agreeable to discharge today.                                                       Pt will benefit from skilled therapeutic intervention in order to improve on the following deficits Decreased activity tolerance;Decreased endurance;Decreased balance;Decreased mobility;Decreased strength;Impaired sensation;Postural dysfunction;Pain;Decreased range of motion   Rehab Potential Good   Clinical Impairments Affecting Rehab Potential chronic pain   PT Frequency 2x / week   PT Duration 8 weeks   PT Next Visit Plan Discharge today per PT plan of care.   Consulted and Agree with Plan of Care Patient          G-Codes - 09-24-2015 1211-01-04    Functional Assessment Tool Used 6MWT distance= 709 feet   Functional Limitation Mobility: Walking and moving around   Mobility: Walking and Moving Around Goal Status [phone]) At least 20 percent but less than 40 percent impaired, limited or restricted   Mobility: Walking and Moving Around Discharge Status [phone]) At least 40 percent but less than 60 percent impaired, limited or restricted      Problem List Patient Active Problem List   Diagnosis Date Noted  . Goals of care, counseling/discussion 01/18/2015  . ASCUS with positive high risk HPV 08/03/2014  . PVD (peripheral vascular disease) ([facility]) 03/27/2014  . Diastolic CHF, chronic ([facility]) 12/21/2013  . Healthcare maintenance 04/25/2013  . Chronic pain syndrome 01/03/2013  . Insomnia 12/22/2011  . Vitamin B12 deficiency 12/27/2009  . Vitamin D deficiency 12/27/2009  .  Anemia 12/27/2009  . Hypomagnesemia 12/29/2008  . OSTEOPENIA 08/01/2008  . Hyperlipidemia 11/01/2006  . GLAUCOMA NOS 11/01/2006  . DM (diabetes  mellitus), type 2, uncontrolled, periph vascular complic ([place]) 41/58/3094  . Obesity 08/26/2006  . Depression 08/26/2006  . HTN (hypertension) 08/26/2006  . Coronary atherosclerosis 08/26/2006  . GERD 08/26/2006   Willow Ora, PTA, [facility] [address], [place], [place] [address] [phone] 09/03/2015, 10:28 PM   Name: [name] [name] MRN: [id number] Date of Birth: 05-21-1946    Addendum by  Billie Ruddy, PT, [facility] [address] [place], [place], [address] Phone: [phone]   Fax:  [phone] 09/03/2015, 10:29 PM  PHYSICAL THERAPY DISCHARGE SUMMARY  Visits from Start of Care: 13  Current functional level related to goals / functional outcomes: See above.   Remaining deficits: Although 6MWT improved significantly from baseline, pt continues to exhibit limited functional endurance (as exhibited by 6MWT < 1,000'). Pt did sustain fall within the past week; however, pt fell when not utilizing standard cane as recommended by PT. Gait stability varies pending chronic pain in lower back and B LE's.   Education / Equipment: HEP; walking program. Recommending pt see MD to further examine chronic pain in lower back and BLE's, as said pain currently limits gait stability and activity tolerance.  Plan: Patient agrees to discharge.  Patient goals were met. Patient is being discharged due to meeting the stated rehab goals.  ?????

## 2015-09-03 NOTE — Patient Instructions (Addendum)
Fall Prevention in the Home  Falls can cause injuries. They can happen to people of all ages. There are many things you can do to make your home safe and to help prevent falls.  WHAT CAN I DO ON THE OUTSIDE OF MY HOME?  Regularly fix the edges of walkways and driveways and fix any cracks.  Remove anything that might make you trip as you walk through a door, such as a raised step or threshold.  Trim any bushes or trees on the path to your home.  Use bright outdoor lighting.  Clear any walking paths of anything that might make someone trip, such as rocks or tools.  Regularly check to see if handrails are loose or broken. Make sure that both sides of any steps have handrails.  Any raised decks and porches should have guardrails on the edges.  Have any leaves, snow, or ice cleared regularly.  Use sand or salt on walking paths during winter.  Clean up any spills in your garage right away. This includes oil or grease spills. WHAT CAN I DO IN THE BATHROOM?   Use night lights.  Install grab bars by the toilet and in the tub and shower. Do not use towel bars as grab bars.  Use non-skid mats or decals in the tub or shower.  If you need to sit down in the shower, use a plastic, non-slip stool.  Keep the floor dry. Clean up any water that spills on the floor as soon as it happens.  Remove soap buildup in the tub or shower regularly.  Attach bath mats securely with double-sided non-slip rug tape.  Do not have throw rugs and other things on the floor that can make you trip. WHAT CAN I DO IN THE BEDROOM?  Use night lights.  Make sure that you have a light by your bed that is easy to reach.  Do not use any sheets or blankets that are too big for your bed. They should not hang down onto the floor.  Have a firm chair that has side arms. You can use this for support while you get dressed.  Do not have throw rugs and other things on the floor that can make you trip. WHAT CAN I DO IN  THE KITCHEN?  Clean up any spills right away.  Avoid walking on wet floors.  Keep items that you use a lot in easy-to-reach places.  If you need to reach something above you, use a strong step stool that has a grab bar.  Keep electrical cords out of the way.  Do not use floor polish or wax that makes floors slippery. If you must use wax, use non-skid floor wax.  Do not have throw rugs and other things on the floor that can make you trip. WHAT CAN I DO WITH MY STAIRS?  Do not leave any items on the stairs.  Make sure that there are handrails on both sides of the stairs and use them. Fix handrails that are broken or loose. Make sure that handrails are as long as the stairways.  Check any carpeting to make sure that it is firmly attached to the stairs. Fix any carpet that is loose or worn.  Avoid having throw rugs at the top or bottom of the stairs. If you do have throw rugs, attach them to the floor with carpet tape.  Make sure that you have a light switch at the top of the stairs and the bottom of the stairs.  If you do not have them, ask someone to add them for you. WHAT ELSE CAN I DO TO HELP PREVENT FALLS?  Wear shoes that:  Do not have high heels.  Have rubber bottoms.  Are comfortable and fit you well.  Are closed at the toe. Do not wear sandals.  If you use a stepladder:  Make sure that it is fully opened. Do not climb a closed stepladder.  Make sure that both sides of the stepladder are locked into place.  Ask someone to hold it for you, if possible.  Clearly mark and make sure that you can see:  Any grab bars or handrails.  First and last steps.  Where the edge of each step is.  Use tools that help you move around (mobility aids) if they are needed. These include:  Canes.  Walkers.  Scooters.  Crutches.  Turn on the lights when you go into a dark area. Replace any light bulbs as soon as they burn out.  Set up your furniture so you have a clear  path. Avoid moving your furniture around.  If any of your floors are uneven, fix them.  If there are any pets around you, be aware of where they are.  Review your medicines with your doctor. Some medicines can make you feel dizzy. This can increase your chance of falling. Ask your doctor what other things that you can do to help prevent falls.   This information is not intended to replace advice given to you by your health care provider. Make sure you discuss any questions you have with your health care provider.   Document Released: 07/25/2009 Document Revised: 02/12/2015 Document Reviewed: 11/02/2014 Elsevier Interactive Patient Education 2016 [employer].  Side to Side Head Motion    Perform at counter top. Walking on solid surface, turn head and eyes to left for 3 counts (1-2-3),   Then, turn head and eyes toward right for 3 count (1-2-3). Repeat this forward and backwards along counter top. Repeat for 3 laps each way. Do _1-2___ sessions per day. Copyright  VHI. All rights reserved.  Up / Down Head Motion    Perform at counter top for safety. Walking on solid surface, move head and eyes toward ceiling for 3 count (1-2-3). Then, move head and eyes toward floor for 3 count (1-2-3). Repeat this sequence forward and backwards along counter top. Repeat 3 laps each way. Do _1-2___ sessions per day. Copyright  VHI. All rights reserved.

## 2015-09-11 ENCOUNTER — Other Ambulatory Visit: Payer: Self-pay | Admitting: Internal Medicine

## 2015-10-14 ENCOUNTER — Other Ambulatory Visit: Payer: Self-pay | Admitting: Internal Medicine

## 2015-10-15 NOTE — Telephone Encounter (Signed)
Rx called in to pharmacy. 

## 2015-10-15 NOTE — Telephone Encounter (Signed)
Pls phone in [place].  High dose Vit D was D/C'd

## 2015-10-23 ENCOUNTER — Ambulatory Visit: Payer: Medicare Other | Admitting: Sports Medicine

## 2015-11-06 ENCOUNTER — Encounter: Payer: Self-pay | Admitting: Sports Medicine

## 2015-11-06 ENCOUNTER — Telehealth: Payer: Self-pay | Admitting: Internal Medicine

## 2015-11-06 ENCOUNTER — Other Ambulatory Visit: Payer: Self-pay | Admitting: Sports Medicine

## 2015-11-06 ENCOUNTER — Ambulatory Visit (INDEPENDENT_AMBULATORY_CARE_PROVIDER_SITE_OTHER): Payer: Medicare Other | Admitting: Sports Medicine

## 2015-11-06 VITALS — BP 125/55 | HR 72 | Ht 61.0 in | Wt 235.0 lb

## 2015-11-06 DIAGNOSIS — M25512 Pain in left shoulder: Secondary | ICD-10-CM

## 2015-11-06 DIAGNOSIS — M542 Cervicalgia: Principal | ICD-10-CM

## 2015-11-06 DIAGNOSIS — G8929 Other chronic pain: Secondary | ICD-10-CM

## 2015-11-06 DIAGNOSIS — M25562 Pain in left knee: Secondary | ICD-10-CM

## 2015-11-06 DIAGNOSIS — M5412 Radiculopathy, cervical region: Secondary | ICD-10-CM

## 2015-11-06 MED ORDER — TRAMADOL HCL 50 MG PO TABS: ORAL_TABLET | ORAL | Status: DC

## 2015-11-06 MED ORDER — METHYLPREDNISOLONE ACETATE 40 MG/ML IJ SUSP
40.0000 mg | Freq: Once | INTRAMUSCULAR | Status: AC
  Administered 2015-11-06: 40 mg via INTRA_ARTICULAR

## 2015-11-06 NOTE — Patient Instructions (Signed)
Chrys Racer from [facility] will call you for your appt for your injection. Her number is [phone]

## 2015-11-06 NOTE — Progress Notes (Signed)
   Subjective:    Patient ID: [name] [name], female    DOB: Feb 08, 1946, 70 y.o.   MRN: [id number]  HPI chief complaint: Left shoulder and left knee pain  Patient comes in today with a couple of different complaints. She is complaining of returning left shoulder and arm pain. Her symptoms are similar in nature to what she experienced in early 2016. An MRI of her cervical spine done in March 2016 showed severe spinal stenosis at C5-C6 and moderate spinal stenosis at C6-C7. An EMG/nerve conduction study done around that same time suggested a C6-C7 radiculopathy. The patient was then sent for a diagnostic cervical ESI and, at her 4 week follow-up with me, had an excellent result. Her symptoms are beginning to return. She would like to consider a repeat ESI.  She is also complaining of diffuse left knee pain. Worse with activity. Intermittent stiffness and swelling. No trauma. No prior x-rays. No trauma.  For both her shoulder and her left knee she takes tramadol which does seem to be helpful.    Review of Systems As above    Objective:   Physical Exam  Well-developed, well-nourished. No acute distress. Sitting comfortable in exam room  Left shoulder: Full painless range of motion. No signs of impingement. Good strength.  Neurological exam of the left upper extremity shows brisk reflexes at the biceps, triceps, and brachial radialis tendons. No focal weakness noted. Sensation is grossly intact to light touch.  Left knee: Full range of motion. No effusion. Mild valgus thrust with standing. She is tender to palpation along the lateral joint line. Negative McMurrays. Good joint stability. 1+ patellofemoral crepitus. Neurovascular intact distally.      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 An anterior medial approach utilized. Patient tolerated  this without difficulty. I will refer her back over to [facility] imaging for a repeat cervical ESI and the patient will follow-up with me in 4 weeks for reevaluation. I will refill her tramadol to take as needed for pain and the patient will call me with questions or concerns prior to her follow-up visit.  Consent obtained and verified. Time-out conducted. Noted no overlying erythema, induration, or other signs of local infection. Skin prepped in a sterile fashion. Topical analgesic spray: Ethyl chloride. Joint: left knee Needle: 22g 1.5 inch Completed without difficulty. Meds: 3cc 1% xylocaine, 1cc (40mg ) depomedrol  Advised to call if fevers/chills, erythema, induration, drainage, or persistent bleeding.

## 2015-11-06 NOTE — Telephone Encounter (Signed)
Call to patient to confirm appointment for 11/07/15 at 9:15 lmtcb

## 2015-11-07 ENCOUNTER — Encounter: Payer: Self-pay | Admitting: Internal Medicine

## 2015-11-07 ENCOUNTER — Ambulatory Visit (INDEPENDENT_AMBULATORY_CARE_PROVIDER_SITE_OTHER): Payer: Medicare Other | Admitting: Internal Medicine

## 2015-11-07 VITALS — BP 139/68 | HR 91 | Temp 98.2°F | Wt 212.9 lb

## 2015-11-07 DIAGNOSIS — E1140 Type 2 diabetes mellitus with diabetic neuropathy, unspecified: Secondary | ICD-10-CM | POA: Diagnosis not present

## 2015-11-07 DIAGNOSIS — IMO0002 Reserved for concepts with insufficient information to code with codable children: Secondary | ICD-10-CM

## 2015-11-07 DIAGNOSIS — Z794 Long term (current) use of insulin: Secondary | ICD-10-CM | POA: Diagnosis not present

## 2015-11-07 DIAGNOSIS — Z0000 Encounter for general adult medical examination without abnormal findings: Secondary | ICD-10-CM

## 2015-11-07 DIAGNOSIS — F329 Major depressive disorder, single episode, unspecified: Secondary | ICD-10-CM

## 2015-11-07 DIAGNOSIS — E785 Hyperlipidemia, unspecified: Secondary | ICD-10-CM

## 2015-11-07 DIAGNOSIS — E1151 Type 2 diabetes mellitus with diabetic peripheral angiopathy without gangrene: Secondary | ICD-10-CM | POA: Diagnosis not present

## 2015-11-07 DIAGNOSIS — Z7982 Long term (current) use of aspirin: Secondary | ICD-10-CM

## 2015-11-07 DIAGNOSIS — I10 Essential (primary) hypertension: Secondary | ICD-10-CM

## 2015-11-07 DIAGNOSIS — E1165 Type 2 diabetes mellitus with hyperglycemia: Secondary | ICD-10-CM

## 2015-11-07 DIAGNOSIS — D649 Anemia, unspecified: Secondary | ICD-10-CM | POA: Diagnosis not present

## 2015-11-07 DIAGNOSIS — F32A Depression, unspecified: Secondary | ICD-10-CM

## 2015-11-07 DIAGNOSIS — E559 Vitamin D deficiency, unspecified: Secondary | ICD-10-CM

## 2015-11-07 DIAGNOSIS — G4700 Insomnia, unspecified: Secondary | ICD-10-CM

## 2015-11-07 DIAGNOSIS — G894 Chronic pain syndrome: Secondary | ICD-10-CM

## 2015-11-07 DIAGNOSIS — I2510 Atherosclerotic heart disease of native coronary artery without angina pectoris: Secondary | ICD-10-CM

## 2015-11-07 DIAGNOSIS — Z139 Encounter for screening, unspecified: Secondary | ICD-10-CM

## 2015-11-07 DIAGNOSIS — Z79899 Other long term (current) drug therapy: Secondary | ICD-10-CM

## 2015-11-07 DIAGNOSIS — Z7984 Long term (current) use of oral hypoglycemic drugs: Secondary | ICD-10-CM

## 2015-11-07 DIAGNOSIS — M8580 Other specified disorders of bone density and structure, unspecified site: Secondary | ICD-10-CM

## 2015-11-07 LAB — GLUCOSE, CAPILLARY: Glucose-Capillary: 190 mg/dL — ABNORMAL HIGH (ref 65–99)

## 2015-11-07 LAB — POCT GLYCOSYLATED HEMOGLOBIN (HGB A1C): Hemoglobin A1C: 7

## 2015-11-07 MED ORDER — PREGABALIN 50 MG PO CAPS: 50.0000 mg | ORAL_CAPSULE | Freq: Three times a day (TID) | ORAL | Status: DC

## 2015-11-07 MED ORDER — DICLOFENAC SODIUM 1 % TD GEL: 4.0000 g | Freq: Four times a day (QID) | TRANSDERMAL | Status: DC

## 2015-11-07 MED ORDER — ZOLPIDEM TARTRATE ER 6.25 MG PO TBCR: 6.2500 mg | EXTENDED_RELEASE_TABLET | Freq: Every evening | ORAL | Status: DC | PRN

## 2015-11-07 MED ORDER — CYANOCOBALAMIN 1000 MCG/ML IJ SOLN
1000.0000 ug | Freq: Once | INTRAMUSCULAR | Status: AC
  Administered 2015-11-07: 1000 ug via INTRAMUSCULAR

## 2015-11-07 NOTE — Assessment & Plan Note (Addendum)
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 She states that after she finished that med, all of the fecal incontinence resolved. Now all BM formed and having 2 per day. She is please with the outcome. I educated her that the sxs might recur and she would need to call me as she might need a repeat course of the ABX  She agrees to Hep C testing today and I explained that I need to use a derogatory code to get it covered.

## 2015-11-07 NOTE — Assessment & Plan Note (Addendum)
Current regimen is lantus 12 units (last note had a 20), metformin 1000 BID, and victoza 1.2 QAM. She checks her CBG TID and I looked at her log and the lowest was 63. She has no sxs but knows to go and eat. A1C 7.3 - 6.7 - 7.0 today. Great control.  I asked about her neuroapthy and she stated it is bad and it is all day long and makes it hard to sleep. To knees B and hands to wrists B. Tingling / pain. Gaba 400 TID not really helping. We discussed options and she preferred to stop the gaba and try something new rather than increasing dose of Gaba. I looked at Elavil, lyrica, and cymbalta in regards to rxn to paxil. Elavil and cymbalta are grade D but lyrica is grade C. So went with lyrica 50 TID.  PLAN  Cont meds Gaba taper to OFF Lyrica 50 TID

## 2015-11-07 NOTE — Assessment & Plan Note (Signed)
She takes AMBIEN 2-4 times per week bc she lives in bad area and it can be loud. No SE - weird behaviour or hung over. She needs RF  PLAN Refill med

## 2015-11-07 NOTE — Assessment & Plan Note (Signed)
She remains on paxil and has been on this prior to 2012 when EPIC was started. No sxs.   PLAN :  Cont med.

## 2015-11-07 NOTE — Assessment & Plan Note (Signed)
BP Readings from Last 3 Encounters:  11/07/15 139/68  11/06/15 125/55  07/04/15 132/69   Well controlled on her atenolol 100 1/2 QD Norvasc 5 QD Lotensin 40 QD Lasix 40 QD imdur 30 QD  PLAN :  Cont current meds

## 2015-11-07 NOTE — Assessment & Plan Note (Signed)
Check Mag level today

## 2015-11-07 NOTE — Progress Notes (Signed)
   Subjective:    Patient ID: [name] [name], female    DOB: 12/16/1945, 70 y.o.   MRN: [id number]  HPI  [name] [name] is here for DM F/U. Please see the A&P for the status of the pt's chronic medical problems.  ROS : per ROS section and in problem oriented charting. All other systems are negative. PMHx, Soc hx, and / or Fam hx : Walks with cane. Daughter doing well. HAs 2 great grand kids. Unable to read.   Review of Systems  HENT:       L tongue tender and swollen  Respiratory:       + DOE  Cardiovascular: Negative for chest pain.  Gastrointestinal: Negative for abdominal pain and diarrhea.  Neurological:       Tingling / burning legs to knees B and hands to wrists B  Psychiatric/Behavioral: Positive for sleep disturbance.       Objective:   Physical Exam  Constitutional: She appears well-developed and well-nourished. No distress.  HENT:  Head: Normocephalic and atraumatic.  Right Ear: External ear normal.  Left Ear: External ear normal.  Nose: Nose normal.  Tongue nl with nl abnl and no swelling  Eyes: Conjunctivae and EOM are normal.  Cardiovascular: Normal rate, regular rhythm and normal heart sounds.   Pulmonary/Chest: Effort normal and breath sounds normal.  Abdominal: Bowel sounds are normal.  Musculoskeletal: She exhibits no edema.  Neurological: She is alert.  Skin: Skin is warm and dry. She is not diaphoretic.  Psychiatric: She has a normal mood and affect. Her behavior is normal. Judgment and thought content normal.          Assessment & Plan:

## 2015-11-07 NOTE — Assessment & Plan Note (Signed)
On lipitor and last LDL was 47 in Jan 2016.   PLAN  Recheck FLP Cont current med

## 2015-11-07 NOTE — Patient Instructions (Signed)
For your nerve pain 1. Take gabapentin 2 times a day for 2 days then once a day or 2 days then stop 2. Start the lyrica when you start the gabapentin once a day  2. You are doing great with your diabetes and blood pressure  3. See me in 3 months  4. If the diarrhea returns call me  5. If the tongue gets worse call me

## 2015-11-07 NOTE — Progress Notes (Signed)
Rx for lyrica and ambien phoned into pharmacy.[name] [name], [name] Treese Cassady1/26/201711:21 AM

## 2015-11-07 NOTE — Assessment & Plan Note (Signed)
Had steroid knee injection yesterday and doing well. Asked for voltarin gel refill

## 2015-11-07 NOTE — Assessment & Plan Note (Signed)
She remains on calcium and Vit D. She is on her feet doing off of the activites she needs to to stay independent. Although not doing aerobic type activities.   PLAN : Cont current meds.

## 2015-11-07 NOTE — Assessment & Plan Note (Addendum)
I had asked her to stop her iron in 01/2015 and she has remained off since then.   PLAN : Check ferritin today B12 IM today

## 2015-11-07 NOTE — Assessment & Plan Note (Signed)
On BB, statin, and ASA. No sxs.  PLAN Cont current meds

## 2015-11-08 LAB — BMP8+ANION GAP
Anion Gap: 19 mmol/L — ABNORMAL HIGH (ref 10.0–18.0)
BUN/Creatinine Ratio: 35 — ABNORMAL HIGH (ref 11–26)
BUN: 32 mg/dL — ABNORMAL HIGH (ref 8–27)
CO2: 22 mmol/L (ref 18–29)
Calcium: 9.7 mg/dL (ref 8.7–10.3)
Chloride: 103 mmol/L (ref 96–106)
Creatinine, Ser: 0.92 mg/dL (ref 0.57–1.00)
GFR calc Af Amer: 73 mL/min/{1.73_m2} (ref >59)
GFR calc non Af Amer: 63 mL/min/{1.73_m2} (ref >59)
Glucose: 199 mg/dL — ABNORMAL HIGH (ref 65–99)
Potassium: 4.7 mmol/L (ref 3.5–5.2)
Sodium: 144 mmol/L (ref 134–144)

## 2015-11-08 LAB — FERRITIN: Ferritin: 127 ng/mL (ref 15–150)

## 2015-11-08 LAB — MAGNESIUM: Magnesium: 1.7 mg/dL (ref 1.6–2.3)

## 2015-11-08 LAB — HEPATITIS C ANTIBODY: Hep C Virus Ab: <0.1 s/co ratio (ref 0.0–0.9)

## 2015-11-08 LAB — VITAMIN D 25 HYDROXY (VIT D DEFICIENCY, FRACTURES): Vit D, 25-Hydroxy: 21 ng/mL — ABNORMAL LOW (ref 30.0–100.0)

## 2015-11-14 ENCOUNTER — Ambulatory Visit
Admission: RE | Admit: 2015-11-14 | Discharge: 2015-11-14 | Disposition: A | Discharge status: Home / Self Care | Payer: Medicare Other | Source: Ambulatory Visit | Attending: Sports Medicine | Admitting: Sports Medicine

## 2015-11-14 DIAGNOSIS — G8929 Other chronic pain: Secondary | ICD-10-CM

## 2015-11-14 DIAGNOSIS — M542 Cervicalgia: Principal | ICD-10-CM

## 2015-11-14 MED ORDER — TRIAMCINOLONE ACETONIDE 40 MG/ML IJ SUSP (RADIOLOGY)
60.0000 mg | Freq: Once | INTRAMUSCULAR | Status: AC
  Administered 2015-11-14: 60 mg via EPIDURAL

## 2015-11-14 MED ORDER — IOHEXOL 300 MG/ML  SOLN
1.0000 mL | Freq: Once | INTRAMUSCULAR | Status: AC | PRN
  Administered 2015-11-14: 1 mL via EPIDURAL

## 2015-11-14 NOTE — Discharge Instructions (Signed)

## 2015-12-04 ENCOUNTER — Ambulatory Visit (INDEPENDENT_AMBULATORY_CARE_PROVIDER_SITE_OTHER): Payer: Medicare Other | Admitting: Sports Medicine

## 2015-12-04 ENCOUNTER — Encounter: Payer: Self-pay | Admitting: Sports Medicine

## 2015-12-04 VITALS — BP 120/72 | Ht 63.0 in | Wt 235.0 lb

## 2015-12-04 DIAGNOSIS — M5412 Radiculopathy, cervical region: Secondary | ICD-10-CM

## 2015-12-04 DIAGNOSIS — M25562 Pain in left knee: Secondary | ICD-10-CM

## 2015-12-04 NOTE — Progress Notes (Signed)
   Subjective:    Patient ID: [name] [name], female    DOB: Aug 15, 1946, 70 y.o.   MRN: [id number]  HPI   Patient comes in today for follow-up on left shoulder and left knee pain. Both have improved. She underwent a single C7-T1 cervical ESI and her symptoms have improved dramatically. No longer getting pain, numbness, or tingling into the left arm. No weakness. Left knee pain has also improved after a recent cortisone injection. She is without complaint today.    Review of Systems As above    Objective:   Physical Exam  Well-developed, well-nourished. No acute distress  Left shoulder: Full painless range of motion. No signs of impingement. Good strength. Reflexes remain brisk at the biceps, triceps, and brachial radialis tendons. No weakness noted. No atrophy.  Left knee: Full range of motion. No effusion. No tenderness to palpation along the medial or lateral joint lines. Negative McMurray's.  1+ patellofemoral crepitus. Neurovascularly intact distally.      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 She also understands that we can repeat a cortisone injection into her left knee if necessary. She will follow-up with me as needed.

## 2015-12-24 ENCOUNTER — Telehealth: Payer: Self-pay | Admitting: Internal Medicine

## 2015-12-24 DIAGNOSIS — K6389 Other specified diseases of intestine: Secondary | ICD-10-CM

## 2015-12-24 DIAGNOSIS — K638219 Small intestinal bacterial overgrowth, unspecified: Secondary | ICD-10-CM

## 2015-12-24 HISTORY — DX: Small intestinal bacterial overgrowth, unspecified: K63.8219

## 2015-12-24 HISTORY — DX: Other specified diseases of intestine: K63.89

## 2015-12-24 MED ORDER — METRONIDAZOLE 500 MG PO TABS: 500.0000 mg | ORAL_TABLET | Freq: Three times a day (TID) | ORAL | Status: DC

## 2015-12-24 NOTE — Telephone Encounter (Signed)
Pt calls and states diarrhea started again Saturday, multiple times daily, even when she drinks water she has diarrhea, you may call her at [phone]

## 2015-12-24 NOTE — Telephone Encounter (Signed)
NEEDS TO TALK W/ NURSE, THEY CALLED HER AND SHE MISSED CALL, PLEASE CALL HER AGAIN

## 2015-12-24 NOTE — Assessment & Plan Note (Signed)
D returned 3 days ago. Just like last yr. 3 episodes during day and 3 at night. No ABD pain. All stools nl prior to 3 days ago. Flares are common. Insurance will not pay for rifaximin. Verified colonoscopy in 2010. Offered GI referral. At this point, she prefers to retry MTZ. Understands SE and no ETOH. UTD suggests combining it with a ceph. MKSAP 16 OK with plain MTZ. ABX rotation also recommended if flares freq. Too early to tell if too freq.To keep simple and reduce ABX,exposure retry MTX.

## 2015-12-24 NOTE — Telephone Encounter (Signed)
Pt requesting the nurse to call back. 

## 2015-12-25 NOTE — Telephone Encounter (Signed)
Patient has picked up antibiotic that was sent in and will call us if diarrhea does not resolve in a few days.

## 2015-12-25 NOTE — Telephone Encounter (Signed)
Spoke w/ pt gave her dr butcher's message, warned against alcohol use while taking the flagyl, she states she does not drink

## 2015-12-25 NOTE — Telephone Encounter (Signed)
Have tried to call pt back twice, got message that the que is full

## 2015-12-26 ENCOUNTER — Other Ambulatory Visit: Payer: Self-pay | Admitting: Internal Medicine

## 2015-12-27 NOTE — Telephone Encounter (Signed)
Dr Maudie Mercury - would you pls do some digging for me? Mag level repeatedly low and needs supplementation. But c/o fecal incontinence last fall. I empirically dx SIBO and Rx metronidazole and it resolved the sxs completely. But had relapse recently and I Rx'd the abx. Could you look into timing of her mag refills and see if any relationship to the incontinence and if there is a supplement less likely to cause D? Thanks!

## 2015-12-27 NOTE — Telephone Encounter (Signed)
Hi Dr. Lynnae January, her magnesium fills do not correlate directly with her symptoms. If she took on-schedule since last month, she would have run out by now. Also, she had symptoms in Aug and Sep 2016 where it looks like she missed some fills. On 11/07/15, her symptoms were noted to resolve, and she recently filled on 10/24/15.  We can still try a slow-release formulation such as Slow-Mag to see if that helps.      MAG64 DR 64 MG TABLET 11/25/2015 60 BUTCHER, ELIZABETH A   MAG64 DR 64 MG TABLET 10/24/2015 60 BUTCHER, ELIZABETH A   MAG64 DR 64 MG TABLET 09/07/2015 60 BUTCHER, ELIZABETH A   MAG64 DR 64 MG TABLET 08/04/2015 60 BUTCHER, ELIZABETH A   MAG64 DR 64 MG TABLET 12/07/2014 60 BUTCHER, ELIZABETH A   MAG64 DR 64 MG TABLET 11/05/2014 60 BUTCHER, ELIZABETH A   MAG64 DR 64 MG TABLET 08/21/2014 60 BUTCHER, ELIZABETH A   MAG64 DR 64 MG TABLET 06/10/2014 60 BUTCHER, ELIZABETH A   MAG64 DR 64 MG TABLET 03/12/2014 60 BUTCHER, ELIZABETH A   MAG64 DR 64 MG TABLET 12/26/2013 60 BUTCHER, ELIZABETH A

## 2015-12-30 ENCOUNTER — Other Ambulatory Visit (INDEPENDENT_AMBULATORY_CARE_PROVIDER_SITE_OTHER): Payer: Medicare Other | Admitting: *Deleted

## 2015-12-30 DIAGNOSIS — E785 Hyperlipidemia, unspecified: Secondary | ICD-10-CM

## 2015-12-30 LAB — COMPREHENSIVE METABOLIC PANEL
ALT: 14 U/L (ref 6–29)
AST: 16 U/L (ref 10–35)
Albumin: 3.4 g/dL — ABNORMAL LOW (ref 3.6–5.1)
Alkaline Phosphatase: 64 U/L (ref 33–130)
BUN: 23 mg/dL (ref 7–25)
CO2: 28 mmol/L (ref 20–31)
Calcium: 8.7 mg/dL (ref 8.6–10.4)
Chloride: 104 mmol/L (ref 98–110)
Creat: 1.1 mg/dL — ABNORMAL HIGH (ref 0.60–0.93)
Glucose, Bld: 81 mg/dL (ref 65–99)
Potassium: 4.9 mmol/L (ref 3.5–5.3)
Sodium: 142 mmol/L (ref 135–146)
Total Bilirubin: 0.4 mg/dL (ref 0.2–1.2)
Total Protein: 6.2 g/dL (ref 6.1–8.1)

## 2015-12-30 LAB — LIPID PANEL
Cholesterol: 100 mg/dL — ABNORMAL LOW (ref 125–200)
HDL: 54 mg/dL (ref >=46)
LDL Cholesterol: 34 mg/dL (ref <130)
Total CHOL/HDL Ratio: 1.9 Ratio (ref <=5.0)
Triglycerides: 62 mg/dL (ref <150)
VLDL: 12 mg/dL (ref <30)

## 2015-12-30 MED ORDER — MAGNESIUM CHLORIDE 64 MG PO TBEC: 1.0000 | DELAYED_RELEASE_TABLET | Freq: Every day | ORAL | Status: DC

## 2015-12-30 NOTE — Addendum Note (Signed)
Addended by: Eulis Foster on: 12/30/2015 08:32 AM   Modules accepted: Orders

## 2015-12-30 NOTE — Addendum Note (Signed)
Addended by: Eulis Foster on: 12/30/2015 08:31 AM   Modules accepted: Orders

## 2015-12-30 NOTE — Telephone Encounter (Signed)
Thank you Dr Maudie Mercury  Triage - would you pls let her know that I change the med just a bit so that it will not worsen D. She needs to let me know if too $$

## 2016-01-06 ENCOUNTER — Encounter: Payer: Self-pay | Admitting: Cardiology

## 2016-01-06 ENCOUNTER — Ambulatory Visit (INDEPENDENT_AMBULATORY_CARE_PROVIDER_SITE_OTHER): Payer: Medicare Other | Admitting: Cardiology

## 2016-01-06 VITALS — BP 122/70 | HR 84 | Ht 63.0 in | Wt 213.0 lb

## 2016-01-06 DIAGNOSIS — I5032 Chronic diastolic (congestive) heart failure: Secondary | ICD-10-CM

## 2016-01-06 DIAGNOSIS — I2583 Coronary atherosclerosis due to lipid rich plaque: Principal | ICD-10-CM

## 2016-01-06 DIAGNOSIS — E785 Hyperlipidemia, unspecified: Secondary | ICD-10-CM

## 2016-01-06 DIAGNOSIS — I2510 Atherosclerotic heart disease of native coronary artery without angina pectoris: Secondary | ICD-10-CM | POA: Diagnosis not present

## 2016-01-06 DIAGNOSIS — I110 Hypertensive heart disease with heart failure: Secondary | ICD-10-CM

## 2016-01-06 MED ORDER — ATORVASTATIN CALCIUM 10 MG PO TABS: 10.0000 mg | ORAL_TABLET | Freq: Every day | ORAL | Status: DC

## 2016-01-06 NOTE — Patient Instructions (Signed)
Medication Instructions:  Your physician has recommended you make the following change in your medication:  Decrease atorvastatin to 10 mg by mouth daily.    Labwork: none  Testing/Procedures: none  Follow-Up: Your physician wants you to follow-up in: 6 months.  You will receive a reminder letter in the mail two months in advance. If you don't receive a letter, please call our office to schedule the follow-up appointment.   Any Other Special Instructions Will Be Listed Below (If Applicable).     If you need a refill on your cardiac medications before your next appointment, please call your pharmacy.

## 2016-01-06 NOTE — Progress Notes (Signed)
Patient ID: SERAS KOONS, female   DOB: 08/18/1946, 70 y.o.   MRN: [id number]    Patient Name: [name] [name] Date of Encounter: 01/06/2016  Primary Care Provider:  Larey Dresser, MD Primary Cardiologist:  Dorothy Spark  Problem List   Past Medical History  Diagnosis Date  . DIABETES MELLITUS, TYPE II 08/26/2006    Insulin dependent. Victoza added 03/2014  . Chronic pain syndrome 01/03/2013    Multifactorial. Non opioid requiring.   L2-5 fusion, with hardware at L2-3, stable per MRI 2010. Followed by neurosurgery (Dr. 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    [name] DEPRESSION 08/26/2006    On paxil    . DYSLIPIDEMIA 11/01/2006    LDL goal < 100, 70 if can achieve without side effects.     . OBESITY 08/26/2006    BMI 39.5. Obesity Class 2.     . GERD 08/26/2006    Heartburn QHS.     [name] PVD (peripheral vascular disease) ([place]) 03/27/2014    2015 LE arterial Duplex - Possible mild inflow disease on the left. 0-49% bilateral SFA disease, without focal stenosis. Three vesel run-off, bilaterally. Medical tx.   [name] CAD 08/26/2006    Cath 07/05:multiple areas of nonobstructive disease = medical & RF mgmt, well preserved global systolic function. Dr. Lia Foyer. Nuclear med stress test 01/2014 : Normal stress nuclear study. LV Ejection Fraction: 76%. LV Wall Motion: NL LV Function; NL Wall Motion.      [name] HYPERTENSION 08/26/2006    On BB, ACEI, lasix, norvasc, metolazone, and imdur.      Past Surgical History  Procedure Laterality Date  . Posterior lumbar interfusion surgery  07/18/07    L2-L3  . Lumbar fusion surgery  10/08    L3-L5  . Rotator cuff surgery    . Cholecystectomy    . Endometrial biospy    . Left heart cath     Allergies  No Known Allergies  HPI  This is a very nice patient who used to follow with Dr Lia Foyer for non-obstructive CAD, HTN and hyperlipidemia. Her cath in 2005 showed multiple areas of  nonobstructive disease = medical & RF mgmt, well preserved global systolic function. She has been doing well until recently when she developed DOE and pronounced fatigue. She has been very complait with her medications. She is minimally active as her prior knee surgery is limiting her, however able to do all the house chores. Moderate activity would give significant SOB that is progressively worsening. She is concerned about LE edema that has been present for the last two months. She has been taking lasix 40 mg po daily chronically. She is also experiencing claudications on short distances. No palpitations or syncope,  PND or orthopnea.   The patient underwent stress testing that was negative for scar or ischemia, normal left ventricular function and normal blood pressure. The patient states that her lower extremity edema has been significantly improved and so is her shortness of breath. She is compliant with her medications and is trying to walk but is limited because of her orthopedic issues. She is complaining of numbness in her left hand that started a few weeks ago.  01/06/2016 this is a 6 months follow-up the patient states that she is doing well, she denies any lower extremity edema, she uses compression stockings without any difficulties. She occasionally wakes up at night with shortness of breath but it's quite infrequent. She denies  any chest pain or worsening dyspnea on exertion. She denies any palpitations or syncope.   Home Medications  Prior to Admission medications   Medication Sig Start Date End Date Taking? Authorizing Provider  amLODipine (NORVASC) 5 MG tablet Take 1 tablet (5 mg total) by mouth daily. 04/25/13 04/25/14 Yes Bartholomew Crews, MD  aspirin 81 MG tablet Take 1 tablet (81 mg total) by mouth daily. 04/25/13  Yes Bartholomew Crews, MD  atenolol (TENORMIN) 100 MG tablet Take 0.5 tablets (50 mg total) by mouth daily. 04/25/13  Yes Bartholomew Crews, MD  atorvastatin  (LIPITOR) 20 MG tablet take 1 tablet by mouth once daily 01/11/14  Yes Bartholomew Crews, MD  benazepril (LOTENSIN) 40 MG tablet take 1 tablet by mouth once daily 04/25/13  Yes Bartholomew Crews, MD  calcium carbonate (OS-CAL) 600 MG TABS Take 1 tablet (600 mg total) by mouth 2 (two) times daily with a meal. 04/25/13  Yes Bartholomew Crews, MD  cetaphil (CETAPHIL) cream Apply 1 application topically 2 (two) times daily as needed (pain).   Yes Historical Provider, MD  cholecalciferol (VITAMIN D) 1000 UNITS tablet Take 1 tablet (1,000 Units total) by mouth daily. 04/26/13  Yes Bartholomew Crews, MD  Cyanocobalamin 1000 MCG CAPS Take 1,000 mcg by mouth daily. 04/25/13  Yes Bartholomew Crews, MD  diclofenac sodium (VOLTAREN) 1 % GEL Apply 4 g topically 4 (four) times daily. Apply small amount to affected area up to 4 times a day. 04/25/13  Yes Bartholomew Crews, MD  ferrous sulfate 325 (65 FE) MG tablet Take 1 tablet (325 mg total) by mouth 2 (two) times daily with a meal. 04/25/13  Yes Bartholomew Crews, MD  furosemide (LASIX) 40 MG tablet Take 1 tablet (40 mg total) by mouth daily. 04/25/13  Yes Bartholomew Crews, MD  gabapentin (NEURONTIN) 400 MG capsule take 1 capsule by mouth three times a day 04/25/13  Yes Bartholomew Crews, MD  Insulin Glargine (LANTUS SOLOSTAR) 100 UNIT/ML SOPN Inject 20 Units into the skin at bedtime. 04/25/13  Yes Bartholomew Crews, MD  isosorbide mononitrate (IMDUR) 30 MG 24 hr tablet Take 1 tablet (30 mg total) by mouth daily. 04/25/13  Yes Bartholomew Crews, MD  loratadine (CLARITIN) 10 MG tablet Take 10 mg by mouth daily as needed for allergies.   Yes Historical Provider, MD  Magnesium Chloride (MAG-DELAY) 535 (64 MG) MG TBCR Take 1 tablet (64 mg total) by mouth 2 (two) times daily. 12/25/13  Yes Bartholomew Crews, MD  metFORMIN (GLUCOPHAGE) 1000 MG tablet Take 1 tablet (1,000 mg total) by mouth 2 (two) times daily with a meal. 04/25/13  Yes Bartholomew Crews, MD    nitroGLYCERIN (NITROSTAT) 0.4 MG SL tablet Place 1 tablet (0.4 mg total) under the tongue every 5 (five) minutes as needed for chest pain. 08/30/13  Yes Burnell Blanks, MD  PARoxetine (PAXIL) 40 MG tablet Take 1 tablet (40 mg total) by mouth every morning. 04/25/13  Yes Bartholomew Crews, MD  ranitidine (ZANTAC) 150 MG tablet Take 1 tablet (150 mg total) by mouth 2 (two) times daily. Try once a day in the evening. If not effective, increase to one pill twice a day 04/25/13 04/25/14 Yes Bartholomew Crews, MD  traMADol (ULTRAM) 50 MG tablet Take 1 tablet (50 mg total) by mouth 2 (two) times daily as needed. 01/01/14  Yes Carlos Levering Draper, DO  zolpidem (AMBIEN CR) 6.25 MG CR tablet Take  1 tablet (6.25 mg total) by mouth at bedtime as needed for sleep. 04/25/13 12/21/13  Bartholomew Crews, MD    Family History  Family History  Problem Relation Age of Onset  . Hypertension Sister   . Hodgkin's lymphoma Sister   . Diabetes Father   . Diabetes Sister   . Hyperlipidemia Sister   . Healthy Mother     Social History  Social History   Social History  . Marital Status: Single    Spouse Name: N/A  . Number of Children: N/A  . Years of Education: N/A   Occupational History  . Not on file.   Social History Main Topics  . Smoking status: Never Smoker   . Smokeless tobacco: Never Used  . Alcohol Use: No  . Drug Use: No  . Sexual Activity: Not on file   Other Topics Concern  . 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 Has two daughters and several grand kids. Severe limitation in ambulation. Occ goes out to church but usually daughter gets groceries.      Review of Systems, as per HPI, otherwise negative General:  No chills, fever, night sweats or weight changes.  Cardiovascular:  No chest pain, dyspnea on exertion, edema, orthopnea, palpitations, paroxysmal nocturnal dyspnea. Dermatological: No rash, lesions/masses Respiratory: No  cough, dyspnea Urologic: No hematuria, dysuria Abdominal:   No nausea, vomiting, diarrhea, bright red blood per rectum, melena, or hematemesis Neurologic:  No visual changes, wkns, changes in mental status. All other systems reviewed and are otherwise negative except as noted above.  Physical Exam  Blood pressure 122/70, pulse 84, height 5\' 3"  (1.6 m), weight 213 lb (96.616 kg).  General: Pleasant, NAD Psych: Normal affect. Neuro: Alert and oriented X 3. Moves all extremities spontaneously. HEENT: Normal  Neck: Supple without bruits or JVD. Lungs:  Resp regular and unlabored, CTA. Heart: RRR no s3, s4, or murmurs. Abdomen: Soft, non-tender, non-distended, BS + x 4.  Extremities: No clubbing, cyanosis,  no LE edema, 2+ DP/PT pulses . Radials 2+ and equal bilaterally.  Labs:  No results for input(s): CKTOTAL, CKMB, TROPONINI in the last 72 hours. Lab Results  Component Value Date   WBC 6.3 05/23/2015   HGB 13.3 12/21/2013   HCT 34.7 05/23/2015   MCV 90 05/23/2015   PLT 353 05/23/2015    No results found for: DDIMER Invalid input(s): POCBNP    Component Value Date/Time   NA 142 12/30/2015 0832   NA 144 11/07/2015 0938   K 4.9 12/30/2015 0832   CL 104 12/30/2015 0832   CO2 28 12/30/2015 0832   GLUCOSE 81 12/30/2015 0832   GLUCOSE 199* 11/07/2015 0938   BUN 23 12/30/2015 0832   BUN 32* 11/07/2015 0938   CREATININE 1.10* 12/30/2015 0832   CREATININE 0.92 11/07/2015 0938   CALCIUM 8.7 12/30/2015 0832   PROT 6.2 12/30/2015 0832   ALBUMIN 3.4* 12/30/2015 0832   AST 16 12/30/2015 0832   ALT 14 12/30/2015 0832   ALKPHOS 64 12/30/2015 0832   BILITOT 0.4 12/30/2015 0832   GFRNONAA 63 11/07/2015 0938   GFRNONAA 68 10/18/2014 1043   GFRAA 73 11/07/2015 0938   GFRAA 79 10/18/2014 1043   Lab Results  Component Value Date   CHOL 100* 12/30/2015   HDL 54 12/30/2015   LDLCALC 34 12/30/2015   TRIG 62 12/30/2015    Accessory Clinical Findings  Echocardiogram - 2012 Left  ventricle:  The cavity size was normal. Wall thickness was normal. Systolic function was vigorous. The estimated ejection fraction was in the range of 65% to 70%. Wall motion was normal; there were no regional wall motion abnormalities. Doppler parameters are consistent with abnormal left ventricular relaxation (grade 1 diastolic dysfunction). - Aortic valve: Trivial regurgitation. - Atrial septum: No defect or patent foramen ovale was identified.  ECG - SR, normal ECG    Assessment & Plan  A very pleasant 70 year old female   1. Chronic diastolic CHF -  Stable, euvolemic, stable weight, continue the same regimen with lasix 40 mg po daily, potassium and creatinine normal on 12/30/2015  2. CAD - h/o non-obstructive CAD by cath in 2005, normal stress test in May 2015, she is asymptomatic no further ischemic workup necessary now..  3. Hyperlipidemia - continue statin, all lipids at goal in March 2017 with LDL 34 decrease atorvastatin to 10 mg daily.  4. Hypertension - controlled.  Follow up in 6 months.  Dorothy Spark, MD, [facility] 01/06/2016, 12:01 PM

## 2016-01-09 ENCOUNTER — Telehealth: Payer: Self-pay | Admitting: Internal Medicine

## 2016-01-09 DIAGNOSIS — R159 Full incontinence of feces: Secondary | ICD-10-CM

## 2016-01-09 DIAGNOSIS — K6389 Other specified diseases of intestine: Secondary | ICD-10-CM

## 2016-01-09 NOTE — Addendum Note (Signed)
Addended by: Larey Dresser A on: 01/09/2016 03:49 PM   Modules accepted: Orders

## 2016-01-09 NOTE — Assessment & Plan Note (Signed)
Called pt. Finished ABX. Had to use metronidazole bc insurance would not approve rifaximin last time. Fecal incontinence did not resolve like last time. SIBBO was empiric dx and pt OK with referral to GI.

## 2016-01-09 NOTE — Telephone Encounter (Signed)
Called pt. Finished ABX. Had to use metronidazole bc insurance would not approve rifaximin last time. Fecal incontinence did not resolve like last time. SIBBO was empiric dx and pt OK with referral to GI.

## 2016-01-09 NOTE — Telephone Encounter (Signed)
Since did not respond as anticipated, will test for C diff. Pt agrees to collect watery stool and bring in.

## 2016-01-10 NOTE — Telephone Encounter (Signed)
Patient called in, understands instruction to collect sample, does not have one yet.  Will bring in when available. FYI

## 2016-01-13 ENCOUNTER — Other Ambulatory Visit: Payer: Medicare Other

## 2016-01-13 DIAGNOSIS — R159 Full incontinence of feces: Secondary | ICD-10-CM

## 2016-01-13 LAB — C DIFFICILE QUICK SCREEN W PCR REFLEX
C Diff antigen: POSITIVE — AB
C Diff toxin: NEGATIVE

## 2016-01-13 NOTE — Progress Notes (Signed)
Pt. recently treated with flagyl for C. diff colitis which was symptomatic.  Several days ago it looks like she had an increase in her stool frequency and a decrease in the consistency (loose and watery).  She submitted a stool sample which just resulted.  It was C diff. antibody positive and toxin negative which suggests there may be some colonization (or possibly recently treated infection).    I called Ms. Stones to see how she was doing.  She states that the diarrhea is improving and is no longer watery.  She has had 3 bowel movements in the last 24 hours.  I discussed options including watching how she does given that she is clinically improving and starting antibiotics if she were to clinically worsen given the stool results.  She was agreeable with this plan and will let us know if she develops watery stool once again with increased frequency.    I will also update Dr. Lynnae January, Ms. Mcdougald's PCP, should she prefer a different plan of care given that she knows her much better.  Ms. Llera asked that Dr. Lynnae January call her if she wants Ms. Yohn to start antibiotics or has a different plan for her.

## 2016-01-14 ENCOUNTER — Other Ambulatory Visit: Payer: Self-pay | Admitting: Internal Medicine

## 2016-01-14 DIAGNOSIS — R197 Diarrhea, unspecified: Secondary | ICD-10-CM

## 2016-01-14 LAB — CLOSTRIDIUM DIFFICILE BY PCR: Toxigenic C. Difficile by PCR: POSITIVE — AB

## 2016-02-03 ENCOUNTER — Encounter: Payer: Self-pay | Admitting: *Deleted

## 2016-02-12 ENCOUNTER — Telehealth: Payer: Self-pay | Admitting: Internal Medicine

## 2016-02-12 NOTE — Telephone Encounter (Signed)
APPT. REMINDER CALL, LMTCB °

## 2016-02-13 ENCOUNTER — Ambulatory Visit (INDEPENDENT_AMBULATORY_CARE_PROVIDER_SITE_OTHER): Payer: Medicare Other | Admitting: Internal Medicine

## 2016-02-13 ENCOUNTER — Encounter: Payer: Self-pay | Admitting: Internal Medicine

## 2016-02-13 VITALS — BP 133/62 | HR 83 | Temp 98.5°F | Wt 218.6 lb

## 2016-02-13 DIAGNOSIS — Z794 Long term (current) use of insulin: Secondary | ICD-10-CM

## 2016-02-13 DIAGNOSIS — I10 Essential (primary) hypertension: Secondary | ICD-10-CM

## 2016-02-13 DIAGNOSIS — Z7189 Other specified counseling: Secondary | ICD-10-CM

## 2016-02-13 DIAGNOSIS — E785 Hyperlipidemia, unspecified: Secondary | ICD-10-CM

## 2016-02-13 DIAGNOSIS — E1165 Type 2 diabetes mellitus with hyperglycemia: Secondary | ICD-10-CM

## 2016-02-13 DIAGNOSIS — IMO0002 Reserved for concepts with insufficient information to code with codable children: Secondary | ICD-10-CM

## 2016-02-13 DIAGNOSIS — E11649 Type 2 diabetes mellitus with hypoglycemia without coma: Secondary | ICD-10-CM

## 2016-02-13 DIAGNOSIS — K6389 Other specified diseases of intestine: Secondary | ICD-10-CM

## 2016-02-13 DIAGNOSIS — E538 Deficiency of other specified B group vitamins: Secondary | ICD-10-CM

## 2016-02-13 DIAGNOSIS — Z79899 Other long term (current) drug therapy: Secondary | ICD-10-CM

## 2016-02-13 DIAGNOSIS — E1151 Type 2 diabetes mellitus with diabetic peripheral angiopathy without gangrene: Secondary | ICD-10-CM

## 2016-02-13 LAB — POCT GLYCOSYLATED HEMOGLOBIN (HGB A1C): Hemoglobin A1C: 6.6

## 2016-02-13 LAB — GLUCOSE, CAPILLARY: Glucose-Capillary: 88 mg/dL (ref 65–99)

## 2016-02-13 NOTE — Patient Instructions (Signed)
I am sorry the diarrhea did not get better.   Hopefully the stomach doctor will be able to help  Let me know if you want to increase the medicine for your diabetic neuropathy - leg / feet pain  See me in 6 months

## 2016-02-13 NOTE — Assessment & Plan Note (Signed)
She brought in her medical POA form but we were not able to get it notarized for the pt. At our first conversation, she did not want aggressive measures. Now, for past 2 or 3 conversations, she does. I reviewed some of the items in the form - does want tube feeds, even surgical placement. Does want aggressive measures even if doctors do not feel it is beneficial.

## 2016-02-13 NOTE — Assessment & Plan Note (Signed)
Dr Meda Coffee had decreased her statin 2/2 low LDL.  PLAN : cont current med

## 2016-02-13 NOTE — Assessment & Plan Note (Addendum)
A1C decreased from 7.0 to 6.6 today. Reviewed log - only a few lows in 60's - 70's. Most great. If she feels it is low, she checks it and eats something. Checks TID. On metformin 1000 BID Lantus 12 units Victoza. The lyrica is helping better than the gabapentin. Doesn't feel like she needs to taper up dose now but will let me know.   PLAN : cont current meds If A1C decreases or has more low, decrease lantus

## 2016-02-13 NOTE — Assessment & Plan Note (Signed)
BP Readings from Last 3 Encounters:  02/13/16 133/62  01/06/16 122/70  12/04/15 120/72   BP good today Atenolol 100 1/2 QD Norvasc 5 Lotensin 40 Lasix 40 Imdur 30  PLAN : cont meds

## 2016-02-13 NOTE — Assessment & Plan Note (Signed)
I need to recheck B 12 level next appt as most recent level 2016 was low.

## 2016-02-13 NOTE — Assessment & Plan Note (Signed)
In 2016, she had complete response to course of metronidazole. Sxs recurred in winter 2017 and repeated course of metronidazole. Did not respond. I had very low pre-test prob of C diff but was referring her to GI so wanted to document C diff. Results + Ag, negative for toxin. ID notified bc of results. When I explained low pre-test prob, they agreed likely colonized and not C diff infxn.  Sxs now - post prandial loose watery stools 2-3 times. Nocturnal urgent stools twice per night. Did have formed stool last weekend.  PLAN : pt will keep GI appt

## 2016-02-13 NOTE — Progress Notes (Signed)
   Subjective:    Patient ID: [name] [name], female    DOB: 1946-08-02, 70 y.o.   MRN: [id number]  HPI  [name] [name] is here for DM F/U. Please see the A&P for the status of the pt's chronic medical problems.  ROS : per ROS section and in problem oriented charting. All other systems are negative. PMHx, Soc hx, and / or Fam hx : Daughter in room. Has medical POA forms needing notarized. Helene Kelp to be 1st Media planner. Doris to be second. She is one of 7 kids and the oldest so helped raise the other 57. 2 have died. No fam h/o GI illnesses.  Review of Systems  Gastrointestinal: Positive for diarrhea. Negative for abdominal pain and blood in stool.  Neuropathic pain better No hypoglycemic sxs     Objective:   Physical Exam  Constitutional: She is oriented to person, place, and time. She appears well-developed.  HENT:  Head: Normocephalic and atraumatic.  Right Ear: External ear normal.  Left Ear: External ear normal.  Nose: Nose normal.  Eyes: Conjunctivae and EOM are normal.  Cardiovascular: Normal rate and regular rhythm.   Murmur heard. Pulmonary/Chest: Effort normal and breath sounds normal.  Musculoskeletal: She exhibits edema.  Neurological: She is alert and oriented to person, place, and time.  Skin: Skin is warm and dry.  Psychiatric: She has a normal mood and affect. Her behavior is normal. Judgment and thought content normal.          Assessment & Plan:

## 2016-02-25 ENCOUNTER — Other Ambulatory Visit: Payer: Self-pay | Admitting: *Deleted

## 2016-02-25 MED ORDER — LIDOCAINE 5 % EX OINT: TOPICAL_OINTMENT | CUTANEOUS | Status: DC

## 2016-02-26 ENCOUNTER — Other Ambulatory Visit: Payer: Self-pay | Admitting: Cardiology

## 2016-02-26 MED ORDER — POTASSIUM CHLORIDE ER 10 MEQ PO TBCR: 10.0000 meq | EXTENDED_RELEASE_TABLET | Freq: Every day | ORAL | Status: DC

## 2016-03-03 ENCOUNTER — Other Ambulatory Visit: Payer: Self-pay | Admitting: Internal Medicine

## 2016-03-04 NOTE — Telephone Encounter (Signed)
Pls phone in 

## 2016-03-04 NOTE — Telephone Encounter (Signed)
Called to pharm 

## 2016-04-07 ENCOUNTER — Other Ambulatory Visit (INDEPENDENT_AMBULATORY_CARE_PROVIDER_SITE_OTHER): Payer: Medicare Other

## 2016-04-07 ENCOUNTER — Ambulatory Visit (INDEPENDENT_AMBULATORY_CARE_PROVIDER_SITE_OTHER): Payer: Medicare Other | Admitting: Gastroenterology

## 2016-04-07 ENCOUNTER — Encounter: Payer: Self-pay | Admitting: Gastroenterology

## 2016-04-07 VITALS — BP 114/60 | HR 80 | Ht 60.0 in | Wt 219.1 lb

## 2016-04-07 DIAGNOSIS — R159 Full incontinence of feces: Secondary | ICD-10-CM

## 2016-04-07 DIAGNOSIS — R197 Diarrhea, unspecified: Secondary | ICD-10-CM

## 2016-04-07 LAB — CBC WITH DIFFERENTIAL/PLATELET
Basophils Absolute: 0 10*3/uL (ref 0.0–0.1)
Basophils Relative: 0.7 % (ref 0.0–3.0)
Eosinophils Absolute: 0 10*3/uL (ref 0.0–0.7)
Eosinophils Relative: 0.7 % (ref 0.0–5.0)
HCT: 35.5 % — ABNORMAL LOW (ref 36.0–46.0)
Hemoglobin: 11.8 g/dL — ABNORMAL LOW (ref 12.0–15.0)
Lymphocytes Relative: 40 % (ref 12.0–46.0)
Lymphs Abs: 2.7 10*3/uL (ref 0.7–4.0)
MCHC: 33.2 g/dL (ref 30.0–36.0)
MCV: 86.3 fl (ref 78.0–100.0)
Monocytes Absolute: 0.5 10*3/uL (ref 0.1–1.0)
Monocytes Relative: 7 % (ref 3.0–12.0)
Neutro Abs: 3.5 10*3/uL (ref 1.4–7.7)
Neutrophils Relative %: 51.6 % (ref 43.0–77.0)
Platelets: 331 10*3/uL (ref 150.0–400.0)
RBC: 4.11 Mil/uL (ref 3.87–5.11)
RDW: 15.4 % (ref 11.5–15.5)
WBC: 6.8 10*3/uL (ref 4.0–10.5)

## 2016-04-07 LAB — SEDIMENTATION RATE: Sed Rate: 9 mm/hr (ref 0–30)

## 2016-04-07 LAB — TSH: TSH: 2.88 u[IU]/mL (ref 0.35–4.50)

## 2016-04-07 LAB — C-REACTIVE PROTEIN: CRP: 0.7 mg/dL (ref 0.5–20.0)

## 2016-04-07 NOTE — Patient Instructions (Signed)
Go to the basement for labs today Use a daily fiber supplement

## 2016-04-07 NOTE — Progress Notes (Signed)
HPI :  70 y/o female here for a new patient evaluation for diarrhea and fecal incontinence. She has a history of DM, obesity, HTN, and arthritis.  She reports having loose stools, she thinks ongoing for 6 months or so. She reports upwards of 10 BMs per day but can vary. She reports strong urgency with some episodes of incontinence. She can sense the urge to have a bowel movement but can't get to the restoom in time, leading to incontinence. Stools are loose and watery at baseline. No blood in the stools. She has some lower abdominal cramping, which is relieved with a bowel movement. No nausea or vomiting. She is eating okay. She thinks her weight is stable. Magnesium supplement is in her record but she does not take this. She has been on metformin for several years, since the 1990s., no dosing change. She has been on Paxil for a long time. She denies any new medications over the past 6 months or so. She has nocturnal stools which can wake her to night. She has increased urge to have a bowel movement after she eats.    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 She was given antibiotics for this which she thinks may have helped for a short lived time. She reportedly has a history of SIBO diagnosed in the past.    She denies any FH of colon cancer.    Colonoscopy 01/2009 - normal  Past Medical History  Diagnosis Date  . DIABETES MELLITUS, TYPE II 08/26/2006    Insulin dependent. Victoza added 03/2014  . Chronic pain syndrome 01/03/2013    Multifactorial. Non opioid requiring.   L2-5 fusion, with hardware at L2-3, stable per MRI 2010. Followed by neurosurgery (Dr. 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    [name] DEPRESSION 08/26/2006    On paxil    . DYSLIPIDEMIA 11/01/2006    LDL goal < 100, 70 if can achieve without side effects.     . OBESITY 08/26/2006    BMI 39.5. Obesity Class 2.       . GERD 08/26/2006    Heartburn QHS.     [name] PVD (peripheral vascular disease) ([place]) 03/27/2014    2015 LE arterial Duplex - Possible mild inflow disease on the left. 0-49% bilateral SFA disease, without focal stenosis. Three vesel run-off, bilaterally. Medical tx.   [name] CAD 08/26/2006    Cath 07/05:multiple areas of nonobstructive disease = medical & RF mgmt, well preserved global systolic function. Dr. Lia Foyer. Nuclear med stress test 01/2014 : Normal stress nuclear study. LV Ejection Fraction: 76%. LV Wall Motion: NL LV Function; NL Wall Motion.      [name] HYPERTENSION 08/26/2006    On BB, ACEI, lasix, norvasc, metolazone, and imdur.     . Arthritis   . Gallstones      Past Surgical History  Procedure Laterality Date  . Posterior lumbar interfusion surgery  07/18/07    L2-L3  . Lumbar fusion surgery  10/08    L3-L5  . Rotator cuff surgery Bilateral   . Cholecystectomy      pt denies 04/07/16  . Endometrial biospy    . Left heart cath     Family History  Problem Relation Age of Onset  . Hypertension Sister   . Diabetes Father   . Diabetes Sister   . Hyperlipidemia Sister   . Cirrhosis Mother   .  Hypertension Father    Social History  Substance Use Topics  . Smoking status: Never Smoker   . Smokeless tobacco: Never Used  . Alcohol Use: No   Current Outpatient Prescriptions  Medication Sig Dispense Refill  . Acetaminophen 650 MG TABS Take 650 mg by mouth 3 (three) times daily.    [name] amLODipine (NORVASC) 5 MG tablet take 1 tablet by mouth once daily 90 tablet 3  . aspirin 81 MG tablet Take 1 tablet (81 mg total) by mouth daily. 90 tablet 3  . ASSURE COMFORT LANCETS 30G MISC E11.59. Use to check blood sugar three times a day 100 each 11  . atenolol (TENORMIN) 100 MG tablet take 1/2 tablet by mouth once daily 45 tablet 3  . atorvastatin (LIPITOR) 10 MG tablet Take 1 tablet (10 mg total) by mouth daily. 30 tablet 11  . atorvastatin (LIPITOR) 20 MG tablet Take 20 mg by mouth daily.    0  . benazepril (LOTENSIN) 40 MG tablet take 1 tablet by mouth once daily 90 tablet 3  . calcium citrate-vitamin D 500-400 MG-UNIT chewable tablet Chew 1 tablet by mouth daily.    . diclofenac sodium (VOLTAREN) 1 % GEL Apply 4 g topically 4 (four) times daily. Apply small amount to affected area up to 4 times a day. 100 g 5  . furosemide (LASIX) 40 MG tablet take 1 tablet by mouth once daily 90 tablet 3  . gabapentin (NEURONTIN) 400 MG capsule   0  . glucose blood (ACCU-CHEK AVIVA PLUS) test strip E11.59. Use to check blood sugar three times a day 100 each 11  . Insulin Pen Needle (NOVOFINE PLUS) 32G X 4 MM MISC Use to inject insulin. E11.51 100 each 11  . isosorbide mononitrate (IMDUR) 30 MG 24 hr tablet take 1 tablet by mouth once daily 90 tablet 3  . LANTUS SOLOSTAR 100 UNIT/ML Solostar Pen INJECT 20 UNITS AT BEDTIME 45 mL 3  . lidocaine (XYLOCAINE) 5 % ointment Apply 2 grams up to 4 times daily to affected area 744.24 g 3  . Liraglutide (VICTOZA) 18 MG/3ML SOPN Inject 0.2 mLs (1.2 mg total) into the skin daily. 9 mL 11  . loratadine (CLARITIN) 10 MG tablet take 1 tablet by mouth once daily 30 tablet 11  . LYRICA 50 MG capsule take 1 capsule by mouth three times a day 90 capsule 11  . MAG64 535 (64 MG) MG TBCR take 1 tablet by mouth twice a day 180 tablet 3  . magnesium chloride (SLOW-MAG) 64 MG TBEC SR tablet Take 1 tablet (64 mg total) by mouth daily. 180 tablet 3  . metFORMIN (GLUCOPHAGE) 1000 MG tablet take 1 tablet by mouth twice a day with food 180 tablet 3  . nitroGLYCERIN (NITROSTAT) 0.4 MG SL tablet Place 1 tablet (0.4 mg total) under the tongue every 5 (five) minutes as needed for chest pain. 25 tablet 3  . PARoxetine (PAXIL) 40 MG tablet take 1 tablet by mouth every morning 90 tablet 3  . potassium chloride (K-DUR) 10 MEQ tablet Take 1 tablet (10 mEq total) by mouth daily. 30 tablet 10  . potassium chloride (K-DUR,KLOR-CON) 10 MEQ tablet   0  . ranitidine (ZANTAC) 150 MG tablet  Take 1 tablet (150 mg total) by mouth 2 (two) times daily. 180 tablet 3  . traMADol (ULTRAM) 50 MG tablet Take 1 tablet (50 mg total) by mouth 2 (two) times daily as needed. 60 tablet 2  . zolpidem (AMBIEN CR)  6.25 MG CR tablet Take 1 tablet (6.25 mg total) by mouth at bedtime as needed for sleep. 90 tablet 3   No current facility-administered medications for this visit.   No Known Allergies   Review of Systems: All systems reviewed and negative except where noted in HPI.   Lab Results  Component Value Date   WBC 6.3 05/23/2015   HGB 13.3 12/21/2013   HCT 34.7 05/23/2015   MCV 90 05/23/2015   PLT 353 05/23/2015    Lab Results  Component Value Date   ALT 14 12/30/2015   AST 16 12/30/2015   ALKPHOS 64 12/30/2015   BILITOT 0.4 12/30/2015    Lab Results  Component Value Date   CREATININE 1.10* 12/30/2015   BUN 23 12/30/2015   NA 142 12/30/2015   K 4.9 12/30/2015   CL 104 12/30/2015   CO2 28 12/30/2015      Physical Exam: BP 114/60 mmHg  Pulse 80  Ht 5' (1.524 m)  Wt 219 lb 2 oz (99.394 kg)  BMI 42.79 kg/m2 Constitutional: Pleasant,well-developed, female in no acute distress, walks with cane HEENT: Normocephalic and atraumatic. Conjunctivae are normal. No scleral icterus. Neck supple.  Cardiovascular: Normal rate, regular rhythm.  Pulmonary/chest: Effort normal and breath sounds normal. No wheezing, rales or rhonchi. Abdominal: Soft, , protuberant / obese abdomen, nontender. Bowel sounds active throughout. There are no masses palpable. No hepatomegaly. Extremities: no edema Lymphadenopathy: No cervical adenopathy noted. Neurological: Alert and oriented to person place and time. Skin: Skin is warm and dry. No rashes noted. Psychiatric: Normal mood and affect. Behavior is normal.   ASSESSMENT AND PLAN: 70 y/o female with PMH as above presenting with chronic diarrhea associated with fecal incontinence. Symptoms ongoing 6 months, she has nocturnal symptoms and high  frequency stools, associated with what sounds like urge incontinence. No new medication changes. Prior C diff testing in April suggested colonization, not active infection. Will repeat C Diff testing to ensure no active infection given her ongoing symptoms, will also send CBC, ESR, CRP, and TSH. If C diff is positive for infection we will treat her for this and see if she responds. If C diff is negative, will proceed with colonoscopy to rule out microscopic colitis / IBD. She will use daily fiber supplement to help bulk stools until C diff testing returns, as if negative for this will start immodium to help slow stool frequency until colonoscopy can be performed. Will let her know results of labs. I discussed risks / benefits of colonoscopy with her, she verbalized understanding and agreeable to proceed pending lab results.   [place] Cellar, MD [facility] Pager [phone]  CC: Bartholomew Crews, MD

## 2016-04-08 ENCOUNTER — Other Ambulatory Visit: Payer: Medicare Other

## 2016-04-08 DIAGNOSIS — R159 Full incontinence of feces: Secondary | ICD-10-CM

## 2016-04-08 DIAGNOSIS — R197 Diarrhea, unspecified: Secondary | ICD-10-CM | POA: Diagnosis not present

## 2016-04-09 ENCOUNTER — Other Ambulatory Visit: Payer: Self-pay | Admitting: *Deleted

## 2016-04-09 LAB — CLOSTRIDIUM DIFFICILE BY PCR: Toxigenic C. Difficile by PCR: DETECTED — CR

## 2016-04-09 MED ORDER — VANCOMYCIN HCL 125 MG PO CAPS: ORAL_CAPSULE | ORAL | Status: DC

## 2016-04-12 ENCOUNTER — Other Ambulatory Visit: Payer: Self-pay | Admitting: Internal Medicine

## 2016-04-13 NOTE — Telephone Encounter (Signed)
Needs Nov, Dec appt with me pls. Dm F/U

## 2016-05-17 ENCOUNTER — Other Ambulatory Visit: Payer: Self-pay | Admitting: Sports Medicine

## 2016-06-01 ENCOUNTER — Other Ambulatory Visit: Payer: Self-pay | Admitting: Internal Medicine

## 2016-06-02 DIAGNOSIS — H35373 Puckering of macula, bilateral: Secondary | ICD-10-CM | POA: Diagnosis not present

## 2016-06-02 DIAGNOSIS — E119 Type 2 diabetes mellitus without complications: Secondary | ICD-10-CM | POA: Diagnosis not present

## 2016-06-02 DIAGNOSIS — H40053 Ocular hypertension, bilateral: Secondary | ICD-10-CM | POA: Diagnosis not present

## 2016-06-02 DIAGNOSIS — Z961 Presence of intraocular lens: Secondary | ICD-10-CM | POA: Diagnosis not present

## 2016-06-02 DIAGNOSIS — H4321 Crystalline deposits in vitreous body, right eye: Secondary | ICD-10-CM | POA: Diagnosis not present

## 2016-06-02 LAB — HM DIABETES EYE EXAM

## 2016-06-08 ENCOUNTER — Other Ambulatory Visit: Payer: Self-pay | Admitting: Internal Medicine

## 2016-06-08 DIAGNOSIS — Z1231 Encounter for screening mammogram for malignant neoplasm of breast: Secondary | ICD-10-CM

## 2016-06-11 ENCOUNTER — Other Ambulatory Visit: Payer: Self-pay | Admitting: Internal Medicine

## 2016-06-12 ENCOUNTER — Encounter: Payer: Self-pay | Admitting: *Deleted

## 2016-06-23 ENCOUNTER — Other Ambulatory Visit: Payer: Self-pay | Admitting: Internal Medicine

## 2016-06-29 ENCOUNTER — Ambulatory Visit
Admission: RE | Admit: 2016-06-29 | Discharge: 2016-06-29 | Disposition: A | Discharge status: Home / Self Care | Payer: Medicare Other | Source: Ambulatory Visit | Attending: Internal Medicine | Admitting: Internal Medicine

## 2016-06-29 DIAGNOSIS — Z1231 Encounter for screening mammogram for malignant neoplasm of breast: Secondary | ICD-10-CM

## 2016-07-11 ENCOUNTER — Other Ambulatory Visit: Payer: Self-pay | Admitting: Internal Medicine

## 2016-07-30 ENCOUNTER — Encounter: Payer: Self-pay | Admitting: Cardiology

## 2016-08-02 ENCOUNTER — Other Ambulatory Visit: Payer: Self-pay | Admitting: Internal Medicine

## 2016-08-12 ENCOUNTER — Encounter (INDEPENDENT_AMBULATORY_CARE_PROVIDER_SITE_OTHER): Payer: Self-pay

## 2016-08-12 ENCOUNTER — Ambulatory Visit (INDEPENDENT_AMBULATORY_CARE_PROVIDER_SITE_OTHER): Payer: Medicare Other | Admitting: Cardiology

## 2016-08-12 ENCOUNTER — Encounter: Payer: Self-pay | Admitting: Cardiology

## 2016-08-12 VITALS — BP 120/58 | HR 73 | Ht 60.0 in | Wt 212.0 lb

## 2016-08-12 DIAGNOSIS — I5032 Chronic diastolic (congestive) heart failure: Secondary | ICD-10-CM | POA: Diagnosis not present

## 2016-08-12 DIAGNOSIS — E782 Mixed hyperlipidemia: Secondary | ICD-10-CM

## 2016-08-12 DIAGNOSIS — I2583 Coronary atherosclerosis due to lipid rich plaque: Secondary | ICD-10-CM

## 2016-08-12 DIAGNOSIS — I10 Essential (primary) hypertension: Secondary | ICD-10-CM

## 2016-08-12 DIAGNOSIS — I2510 Atherosclerotic heart disease of native coronary artery without angina pectoris: Secondary | ICD-10-CM

## 2016-08-12 MED ORDER — ATENOLOL 100 MG PO TABS: 50.0000 mg | ORAL_TABLET | Freq: Every day | ORAL | 3 refills | Status: DC

## 2016-08-12 MED ORDER — ATORVASTATIN CALCIUM 10 MG PO TABS
10.0000 mg | ORAL_TABLET | Freq: Every day | ORAL | 3 refills | Status: DC
Start: 2016-08-12 — End: 2017-04-08

## 2016-08-12 MED ORDER — FUROSEMIDE 40 MG PO TABS: 40.0000 mg | ORAL_TABLET | Freq: Every day | ORAL | 3 refills | Status: DC

## 2016-08-12 MED ORDER — BENAZEPRIL HCL 40 MG PO TABS: 40.0000 mg | ORAL_TABLET | Freq: Every day | ORAL | 3 refills | Status: DC

## 2016-08-12 MED ORDER — ISOSORBIDE MONONITRATE ER 30 MG PO TB24: 30.0000 mg | ORAL_TABLET | Freq: Every day | ORAL | 3 refills | Status: DC

## 2016-08-12 MED ORDER — POTASSIUM CHLORIDE ER 10 MEQ PO TBCR: 10.0000 meq | EXTENDED_RELEASE_TABLET | Freq: Every day | ORAL | 3 refills | Status: DC

## 2016-08-12 MED ORDER — AMLODIPINE BESYLATE 5 MG PO TABS
5.0000 mg | ORAL_TABLET | Freq: Every day | ORAL | 3 refills | Status: DC
Start: 2016-08-12 — End: 2017-03-04

## 2016-08-12 MED ORDER — NITROGLYCERIN 0.4 MG SL SUBL: 0.4000 mg | SUBLINGUAL_TABLET | SUBLINGUAL | 3 refills | Status: DC | PRN

## 2016-08-12 NOTE — Patient Instructions (Addendum)
**Note De-identified [name] [name] Obfuscation** Medication Instructions:  Same-no changes  Labwork: None  Testing/Procedures: None  Follow-Up: Your physician wants you to follow-up in: 6 months. You will receive a reminder letter in the mail two months in advance. If you don't receive a letter, please call our office to schedule the follow-up appointment.      If you need a refill on your cardiac medications before your next appointment, please call your pharmacy.   

## 2016-08-12 NOTE — Progress Notes (Signed)
Patient ID: LENDER PASOS, female   DOB: 10/08/1946, 70 y.o.   MRN: [id number]    Patient Name: [name] [name] Date of Encounter: 08/12/2016  Primary Care Provider:  Larey Dresser, MD Primary Cardiologist:  Ena Dawley  Problem List   Past Medical History:  Diagnosis Date  . Arthritis   . CAD 08/26/2006   Cath 07/05:multiple areas of nonobstructive disease = medical & RF mgmt, well preserved global systolic function. Dr. Lia Foyer. Nuclear med stress test 01/2014 : Normal stress nuclear study. LV Ejection Fraction: 76%. LV Wall Motion: NL LV Function; NL Wall Motion.      . Chronic pain syndrome 01/03/2013   Multifactorial. Non opioid requiring.   L2-5 fusion, with hardware at L2-3, stable per MRI 2010. Followed by neurosurgery (Dr. 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    [name] DEPRESSION 08/26/2006   On paxil    . DIABETES MELLITUS, TYPE II 08/26/2006   Insulin dependent. Victoza added 03/2014  . DYSLIPIDEMIA 11/01/2006   LDL goal < 100, 70 if can achieve without side effects.     . Gallstones   . GERD 08/26/2006   Heartburn QHS.     [name] HYPERTENSION 08/26/2006   On BB, ACEI, lasix, norvasc, metolazone, and imdur.     . OBESITY 08/26/2006   BMI 39.5. Obesity Class 2.     . PVD (peripheral vascular disease) ([place]) 03/27/2014   2015 LE arterial Duplex - Possible mild inflow disease on the left. 0-49% bilateral SFA disease, without focal stenosis. Three vesel run-off, bilaterally. Medical tx.    Past Surgical History:  Procedure Laterality Date  . CHOLECYSTECTOMY     pt denies 04/07/16  . endometrial biospy    . LEFT HEART CATH    . lumbar fusion surgery  10/08   L3-L5  . posterior lumbar interfusion surgery  07/18/07   L2-L3  . rotator cuff surgery Bilateral    Allergies  No Known Allergies  HPI  This is a very nice patient who used to follow with Dr Lia Foyer for non-obstructive CAD, HTN and  hyperlipidemia. Her cath in 2005 showed multiple areas of nonobstructive disease = medical & RF mgmt, well preserved global systolic function. She has been doing well until recently when she developed DOE and pronounced fatigue. She has been very complait with her medications. She is minimally active as her prior knee surgery is limiting her, however able to do all the house chores. Moderate activity would give significant SOB that is progressively worsening. She is concerned about LE edema that has been present for the last two months. She has been taking lasix 40 mg po daily chronically. She is also experiencing claudications on short distances. No palpitations or syncope,  PND or orthopnea.   The patient underwent stress testing that was negative for scar or ischemia, normal left ventricular function and normal blood pressure. The patient states that her lower extremity edema has been significantly improved and so is her shortness of breath. She is compliant with her medications and is trying to walk but is limited because of her orthopedic issues. She is complaining of numbness in her left hand that started a few weeks ago.  08/12/2016 -  6 months follow-up , the patient states she has been doing really wel, she denies any chest pain or shortness of breath, no lower extremity edema and orthopnea, paroxysmal noturnal dyspnea. She has been dealing with arthritis pain, but is  able to do all activities of daily living, she is complaint with all of her meds and has no side effects. Her blood work is being done by her primary care physician.   Home Medications  Prior to Admission medications   Medication Sig Start Date End Date Taking? Authorizing Provider  amLODipine (NORVASC) 5 MG tablet Take 1 tablet (5 mg total) by mouth daily. 04/25/13 04/25/14 Yes Bartholomew Crews, MD  aspirin 81 MG tablet Take 1 tablet (81 mg total) by mouth daily. 04/25/13  Yes Bartholomew Crews, MD  atenolol (TENORMIN) 100 MG  tablet Take 0.5 tablets (50 mg total) by mouth daily. 04/25/13  Yes Bartholomew Crews, MD  atorvastatin (LIPITOR) 20 MG tablet take 1 tablet by mouth once daily 01/11/14  Yes Bartholomew Crews, MD  benazepril (LOTENSIN) 40 MG tablet take 1 tablet by mouth once daily 04/25/13  Yes Bartholomew Crews, MD  calcium carbonate (OS-CAL) 600 MG TABS Take 1 tablet (600 mg total) by mouth 2 (two) times daily with a meal. 04/25/13  Yes Bartholomew Crews, MD  cetaphil (CETAPHIL) cream Apply 1 application topically 2 (two) times daily as needed (pain).   Yes Historical Provider, MD  cholecalciferol (VITAMIN D) 1000 UNITS tablet Take 1 tablet (1,000 Units total) by mouth daily. 04/26/13  Yes Bartholomew Crews, MD  Cyanocobalamin 1000 MCG CAPS Take 1,000 mcg by mouth daily. 04/25/13  Yes Bartholomew Crews, MD  diclofenac sodium (VOLTAREN) 1 % GEL Apply 4 g topically 4 (four) times daily. Apply small amount to affected area up to 4 times a day. 04/25/13  Yes Bartholomew Crews, MD  ferrous sulfate 325 (65 FE) MG tablet Take 1 tablet (325 mg total) by mouth 2 (two) times daily with a meal. 04/25/13  Yes Bartholomew Crews, MD  furosemide (LASIX) 40 MG tablet Take 1 tablet (40 mg total) by mouth daily. 04/25/13  Yes Bartholomew Crews, MD  gabapentin (NEURONTIN) 400 MG capsule take 1 capsule by mouth three times a day 04/25/13  Yes Bartholomew Crews, MD  Insulin Glargine (LANTUS SOLOSTAR) 100 UNIT/ML SOPN Inject 20 Units into the skin at bedtime. 04/25/13  Yes Bartholomew Crews, MD  isosorbide mononitrate (IMDUR) 30 MG 24 hr tablet Take 1 tablet (30 mg total) by mouth daily. 04/25/13  Yes Bartholomew Crews, MD  loratadine (CLARITIN) 10 MG tablet Take 10 mg by mouth daily as needed for allergies.   Yes Historical Provider, MD  Magnesium Chloride (MAG-DELAY) 535 (64 MG) MG TBCR Take 1 tablet (64 mg total) by mouth 2 (two) times daily. 12/25/13  Yes Bartholomew Crews, MD  metFORMIN (GLUCOPHAGE) 1000 MG tablet Take  1 tablet (1,000 mg total) by mouth 2 (two) times daily with a meal. 04/25/13  Yes Bartholomew Crews, MD  nitroGLYCERIN (NITROSTAT) 0.4 MG SL tablet Place 1 tablet (0.4 mg total) under the tongue every 5 (five) minutes as needed for chest pain. 08/30/13  Yes Burnell Blanks, MD  PARoxetine (PAXIL) 40 MG tablet Take 1 tablet (40 mg total) by mouth every morning. 04/25/13  Yes Bartholomew Crews, MD  ranitidine (ZANTAC) 150 MG tablet Take 1 tablet (150 mg total) by mouth 2 (two) times daily. Try once a day in the evening. If not effective, increase to one pill twice a day 04/25/13 04/25/14 Yes Bartholomew Crews, MD  traMADol (ULTRAM) 50 MG tablet Take 1 tablet (50 mg total) by mouth 2 (two) times daily as  needed. 01/01/14  Yes Carlos Levering Draper, DO  zolpidem (AMBIEN CR) 6.25 MG CR tablet Take 1 tablet (6.25 mg total) by mouth at bedtime as needed for sleep. 04/25/13 12/21/13  Bartholomew Crews, MD    Family History  Family History  Problem Relation Age of Onset  . Cirrhosis Mother   . Diabetes Father   . Hypertension Father   . Hypertension Sister   . Diabetes Sister   . Hyperlipidemia Sister     Social History  Social History   Social History  . Marital status: Single    Spouse name: N/A  . Number of children: 2  . Years of education: N/A   Occupational History  . Not on file.   Social History Main Topics  . Smoking status: Never Smoker  . Smokeless tobacco: Never Used  . Alcohol use No  . Drug use: No  . Sexual activity: Not on file   Other Topics Concern  . 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 Has two daughters and several grand kids. Severe limitation in ambulation. Occ goes out to church but usually daughter gets groceries.    She is one of 7 kids and the oldest so helped raise the other 6.     Review of Systems, as per HPI, otherwise negative General:  No chills, fever, night sweats or weight changes.    Cardiovascular:  No chest pain, dyspnea on exertion, edema, orthopnea, palpitations, paroxysmal nocturnal dyspnea. Dermatological: No rash, lesions/masses Respiratory: No cough, dyspnea Urologic: No hematuria, dysuria Abdominal:   No nausea, vomiting, diarrhea, bright red blood per rectum, melena, or hematemesis Neurologic:  No visual changes, wkns, changes in mental status. All other systems reviewed and are otherwise negative except as noted above.  Physical Exam  Blood pressure (!) 120/58, pulse 73, height 5' (1.524 m), weight 212 lb (96.2 kg).  General: Pleasant, NAD Psych: Normal affect. Neuro: Alert and oriented X 3. Moves all extremities spontaneously. HEENT: Normal  Neck: Supple without bruits or JVD. Lungs:  Resp regular and unlabored, CTA. Heart: RRR no s3, s4, or murmurs. Abdomen: Soft, non-tender, non-distended, BS + x 4.  Extremities: No clubbing, cyanosis,  no LE edema, 2+ DP/PT pulses . Radials 2+ and equal bilaterally.  Labs:  No results for input(s): CKTOTAL, CKMB, TROPONINI in the last 72 hours. Lab Results  Component Value Date   WBC 6.8 04/07/2016   HGB 11.8 (L) 04/07/2016   HCT 35.5 (L) 04/07/2016   MCV 86.3 04/07/2016   PLT 331.0 04/07/2016    No results found for: DDIMER Invalid input(s): POCBNP    Component Value Date/Time   NA 142 12/30/2015 0832   NA 144 11/07/2015 0938   K 4.9 12/30/2015 0832   CL 104 12/30/2015 0832   CO2 28 12/30/2015 0832   GLUCOSE 81 12/30/2015 0832   BUN 23 12/30/2015 0832   BUN 32 (H) 11/07/2015 0938   CREATININE 1.10 (H) 12/30/2015 0832   CALCIUM 8.7 12/30/2015 0832   PROT 6.2 12/30/2015 0832   ALBUMIN 3.4 (L) 12/30/2015 0832   AST 16 12/30/2015 0832   ALT 14 12/30/2015 0832   ALKPHOS 64 12/30/2015 0832   BILITOT 0.4 12/30/2015 0832   GFRNONAA 63 11/07/2015 0938   GFRNONAA 68 10/18/2014 1043   GFRAA 73 11/07/2015 0938   GFRAA 79 10/18/2014 1043   Lab Results  Component Value Date   CHOL 100 (L) 12/30/2015  HDL 54 12/30/2015   LDLCALC 34 12/30/2015   TRIG 62 12/30/2015    Accessory Clinical Findings  Echocardiogram - 2012 Left ventricle: The cavity size was normal. Wall thickness was normal. Systolic function was vigorous. The estimated ejection fraction was in the range of 65% to 70%. Wall motion was normal; there were no regional wall motion abnormalities. Doppler parameters are consistent with abnormal left ventricular relaxation (grade 1 diastolic dysfunction). - Aortic valve: Trivial regurgitation. - Atrial septum: No defect or patent foramen ovale was identified.  ECG - SR, normal ECG    Assessment & Plan  A very pleasant 70 year old female   1. Chronic diastolic CHF -  Stable weight, stable symptoms, euvolemic, continue the same regimen with lasix 40 mg po daily, potassium and creatinine normal on 12/30/2015, she will have labs done on 08/29/16 at her PCP office.  2. CAD - h/o non-obstructive CAD by cath in 2005, normal stress test in May 2015, she is asymptomatic no further ischemic workup necessary now.  3. Hyperlipidemia - continue statin, all lipids at goal in March 2017 with LDL 34, we decreased atorvastatin to 10 mg daily.  4. Hypertension - controlled.  Follow up in 6 months.  Ena Dawley, MD, [facility] 08/12/2016, 8:59 AM

## 2016-08-26 ENCOUNTER — Encounter: Payer: Self-pay | Admitting: Sports Medicine

## 2016-08-26 ENCOUNTER — Ambulatory Visit (INDEPENDENT_AMBULATORY_CARE_PROVIDER_SITE_OTHER): Payer: Medicare Other | Admitting: Sports Medicine

## 2016-08-26 VITALS — BP 142/70 | Ht 63.0 in | Wt 232.0 lb

## 2016-08-26 DIAGNOSIS — M5412 Radiculopathy, cervical region: Secondary | ICD-10-CM | POA: Diagnosis not present

## 2016-08-26 DIAGNOSIS — M542 Cervicalgia: Secondary | ICD-10-CM | POA: Diagnosis not present

## 2016-08-26 MED ORDER — TRAMADOL HCL 50 MG PO TABS: 50.0000 mg | ORAL_TABLET | Freq: Two times a day (BID) | ORAL | 2 refills | Status: DC | PRN

## 2016-08-26 NOTE — Progress Notes (Signed)
   Subjective:    Patient ID: [name] [name], female    DOB: May 30, 1946, 70 y.o.   MRN: [id number]  HPI chief complaint: Left shoulder pain  Very pleasant 70 year old female comes in today with returning left shoulder and arm pain. She has a well-documented history of cervical spinal stenosis. Previous cervical ESI's have provided her with several months of relief. First epidural was done in March 2016. Most recent epidural was done in February 2017. Her pain began to return a month ago. It is similar in nature to what she's experienced previously. Pain will radiate down her arm. She has some associated numbness and tingling as well. She denies any recent trauma. She does take tramadol occasionally which does seem to help. She is here today with her daughter.  Interim medical history reviewed Medications reviewed Allergies reviewed    Review of Systems As above    Objective:   Physical Exam  Well-developed, well-nourished. No acute distress.  Left shoulder: Full painless range of motion. No signs of impingement. Good strength.  Neurological exam of the left upper extremity shows brisk reflexes, good strength, and no atrophy. Sensation is intact to light touch grossly.       Assessment & Plan:   Returning left shoulder/arm pain secondary to cervical spinal stenosis  Since the patient has had good results with cervical ESI's in the past, I will refer her to [facility] imaging for repeat injection. I've also given her a refill on her tramadol. She will let me know if her symptoms persist post injection. Follow-up as needed.

## 2016-08-27 ENCOUNTER — Other Ambulatory Visit: Payer: Self-pay | Admitting: Sports Medicine

## 2016-08-27 DIAGNOSIS — M542 Cervicalgia: Secondary | ICD-10-CM

## 2016-09-04 ENCOUNTER — Other Ambulatory Visit: Payer: Self-pay | Admitting: Internal Medicine

## 2016-09-10 ENCOUNTER — Ambulatory Visit: Payer: Medicare Other | Admitting: Internal Medicine

## 2016-09-28 ENCOUNTER — Ambulatory Visit
Admission: RE | Admit: 2016-09-28 | Discharge: 2016-09-28 | Disposition: A | Discharge status: Home / Self Care | Payer: Medicare Other | Source: Ambulatory Visit | Attending: Sports Medicine | Admitting: Sports Medicine

## 2016-09-28 DIAGNOSIS — M50221 Other cervical disc displacement at C4-C5 level: Secondary | ICD-10-CM | POA: Diagnosis not present

## 2016-09-28 DIAGNOSIS — M542 Cervicalgia: Secondary | ICD-10-CM

## 2016-09-28 MED ORDER — TRIAMCINOLONE ACETONIDE 40 MG/ML IJ SUSP (RADIOLOGY)
60.0000 mg | Freq: Once | INTRAMUSCULAR | Status: AC
  Administered 2016-09-28: 60 mg via EPIDURAL

## 2016-09-28 MED ORDER — IOPAMIDOL (ISOVUE-M 300) INJECTION 61%
1.0000 mL | Freq: Once | INTRAMUSCULAR | Status: AC | PRN
  Administered 2016-09-28: 1 mL via EPIDURAL

## 2016-09-28 NOTE — Discharge Instructions (Signed)

## 2016-10-20 ENCOUNTER — Other Ambulatory Visit: Payer: Self-pay | Admitting: Internal Medicine

## 2016-10-20 NOTE — Telephone Encounter (Signed)
Pls phone in lyrica

## 2016-10-20 NOTE — Telephone Encounter (Signed)
Lyrica rx called to [facility] pharmacy.

## 2016-10-29 ENCOUNTER — Ambulatory Visit: Payer: Medicare Other | Admitting: Internal Medicine

## 2016-10-30 ENCOUNTER — Ambulatory Visit: Payer: Medicare Other | Admitting: Internal Medicine

## 2016-10-30 ENCOUNTER — Encounter: Payer: Self-pay | Admitting: Internal Medicine

## 2016-12-10 ENCOUNTER — Telehealth: Payer: Self-pay | Admitting: *Deleted

## 2016-12-10 ENCOUNTER — Ambulatory Visit (INDEPENDENT_AMBULATORY_CARE_PROVIDER_SITE_OTHER): Payer: Medicare Other | Admitting: Internal Medicine

## 2016-12-10 VITALS — BP 122/60 | HR 95 | Temp 97.6°F | Wt 210.5 lb

## 2016-12-10 DIAGNOSIS — E669 Obesity, unspecified: Secondary | ICD-10-CM | POA: Diagnosis not present

## 2016-12-10 DIAGNOSIS — D509 Iron deficiency anemia, unspecified: Secondary | ICD-10-CM | POA: Diagnosis not present

## 2016-12-10 DIAGNOSIS — E1151 Type 2 diabetes mellitus with diabetic peripheral angiopathy without gangrene: Secondary | ICD-10-CM | POA: Diagnosis not present

## 2016-12-10 DIAGNOSIS — Z794 Long term (current) use of insulin: Secondary | ICD-10-CM

## 2016-12-10 DIAGNOSIS — K6389 Other specified diseases of intestine: Secondary | ICD-10-CM | POA: Diagnosis not present

## 2016-12-10 DIAGNOSIS — E1142 Type 2 diabetes mellitus with diabetic polyneuropathy: Secondary | ICD-10-CM | POA: Insufficient documentation

## 2016-12-10 DIAGNOSIS — Z872 Personal history of diseases of the skin and subcutaneous tissue: Secondary | ICD-10-CM

## 2016-12-10 DIAGNOSIS — I2510 Atherosclerotic heart disease of native coronary artery without angina pectoris: Secondary | ICD-10-CM | POA: Diagnosis not present

## 2016-12-10 DIAGNOSIS — IMO0002 Reserved for concepts with insufficient information to code with codable children: Secondary | ICD-10-CM

## 2016-12-10 DIAGNOSIS — D519 Vitamin B12 deficiency anemia, unspecified: Secondary | ICD-10-CM

## 2016-12-10 DIAGNOSIS — Z6836 Body mass index (BMI) 36.0-36.9, adult: Secondary | ICD-10-CM | POA: Diagnosis not present

## 2016-12-10 DIAGNOSIS — E1140 Type 2 diabetes mellitus with diabetic neuropathy, unspecified: Secondary | ICD-10-CM | POA: Diagnosis not present

## 2016-12-10 DIAGNOSIS — I10 Essential (primary) hypertension: Secondary | ICD-10-CM | POA: Diagnosis not present

## 2016-12-10 DIAGNOSIS — E538 Deficiency of other specified B group vitamins: Secondary | ICD-10-CM

## 2016-12-10 DIAGNOSIS — E1165 Type 2 diabetes mellitus with hyperglycemia: Secondary | ICD-10-CM

## 2016-12-10 DIAGNOSIS — E11649 Type 2 diabetes mellitus with hypoglycemia without coma: Secondary | ICD-10-CM

## 2016-12-10 DIAGNOSIS — F339 Major depressive disorder, recurrent, unspecified: Secondary | ICD-10-CM

## 2016-12-10 DIAGNOSIS — F5104 Psychophysiologic insomnia: Secondary | ICD-10-CM

## 2016-12-10 DIAGNOSIS — Z7982 Long term (current) use of aspirin: Secondary | ICD-10-CM

## 2016-12-10 DIAGNOSIS — Z79899 Other long term (current) drug therapy: Secondary | ICD-10-CM

## 2016-12-10 DIAGNOSIS — F325 Major depressive disorder, single episode, in full remission: Secondary | ICD-10-CM

## 2016-12-10 DIAGNOSIS — IMO0001 Reserved for inherently not codable concepts without codable children: Secondary | ICD-10-CM

## 2016-12-10 DIAGNOSIS — Z0000 Encounter for general adult medical examination without abnormal findings: Secondary | ICD-10-CM

## 2016-12-10 DIAGNOSIS — F5101 Primary insomnia: Secondary | ICD-10-CM

## 2016-12-10 LAB — GLUCOSE, CAPILLARY: Glucose-Capillary: 157 mg/dL — ABNORMAL HIGH (ref 65–99)

## 2016-12-10 LAB — POCT GLYCOSYLATED HEMOGLOBIN (HGB A1C): Hemoglobin A1C: 7

## 2016-12-10 MED ORDER — NYSTATIN 100000 UNIT/GM EX POWD: Freq: Four times a day (QID) | CUTANEOUS | 0 refills | Status: DC

## 2016-12-10 MED ORDER — INSULIN GLARGINE 100 UNIT/ML SOLOSTAR PEN: 8.0000 [IU] | PEN_INJECTOR | Freq: Every day | SUBCUTANEOUS | 3 refills | Status: DC

## 2016-12-10 MED ORDER — ZOLPIDEM TARTRATE ER 6.25 MG PO TBCR: 6.2500 mg | EXTENDED_RELEASE_TABLET | Freq: Every evening | ORAL | 3 refills | Status: DC | PRN

## 2016-12-10 MED ORDER — PREGABALIN 100 MG PO CAPS: 100.0000 mg | ORAL_CAPSULE | Freq: Three times a day (TID) | ORAL | 3 refills | Status: DC

## 2016-12-10 NOTE — Assessment & Plan Note (Addendum)
This problem is chronic and stable. She continues to see cardiology. She is on a beta blocker, statin, and aspirin. She is asymptomatic. She has no side effects to these medications. Her statin is prava 40 which is moderate intensity. She has coronary artery disease, she should be on a high intensity. However her LDL is 104. I should discuss changing to high intensity statin at her next appointment.  PLAN:  Cont current meds

## 2016-12-10 NOTE — Assessment & Plan Note (Signed)
This problem is chronic and worse. Since I last saw her, her insurance would not pay for her Ambien. She lays down about 7:30 at night and will sleep until midnight, a total of 4 hours. She then is awake the rest of the night and all day. She does not take naps. She is fatigued during the day. She has very good response to Ambien when she could get it. She had no side effects to Ambien. She would like to resume Ambien possible.  Plan : Ambien refill to see if can get preauthorization

## 2016-12-10 NOTE — Assessment & Plan Note (Signed)
She did not have a candidal rash under her abdominal pannus. However she has had in the past. I prescribed nystatin powder so that she can have it at home if the moisture gets worse or she develops a candidal infection.

## 2016-12-10 NOTE — Assessment & Plan Note (Signed)
This problem is chronic and stable. It is multifactorial including iron deficiency and B12 deficiency. I am checking B12 today along with a CBC. She is on iron supplementation. Her most recent ferritin in January 2017 was 127.  Plan : CBC today along with B12 level

## 2016-12-10 NOTE — Progress Notes (Signed)
   Subjective:    Patient ID: [name] [name], female    DOB: 1946-05-22, 71 y.o.   MRN: [id number]  HPI  [name] [name] is here for DM F/U. Please see the A&P for the status of the pt's chronic medical problems.  ROS : per ROS section and in problem oriented charting. All other systems are negative.  PMHx, Soc hx, and / or Fam hx : Lives alone. Her daughter accompanied her to her visit today. They has completed the medical power of attorney forms but they have not yet been notarized. She and her daughters have discussed her end-of-life wishes. She never learned how to drive. Her daughter takes her to the grocery store.   Review of Systems  Constitutional: Negative for fatigue.  Respiratory: Negative for chest tightness.   Cardiovascular: Negative for chest pain.  Gastrointestinal: Positive for diarrhea.  Musculoskeletal: Positive for arthralgias, back pain and gait problem.  Skin: Positive for color change. Negative for rash and wound.  Neurological:       Positive for distal neuropathy bilaterally  Psychiatric/Behavioral: Positive for sleep disturbance.       Objective:   Physical Exam  Constitutional: She appears well-developed and well-nourished. No distress.  HENT:  Head: Normocephalic and atraumatic.  Right Ear: External ear normal.  Left Ear: External ear normal.  Nose: Nose normal.  Eyes: Conjunctivae and EOM are normal. Right eye exhibits no discharge. Left eye exhibits no discharge. No scleral icterus.  Cardiovascular: Normal rate, regular rhythm and normal heart sounds.   Abdominal: Bowel sounds are normal.  Musculoskeletal: She exhibits edema.  Skin: Skin is warm and dry. No rash noted. She is not diaphoretic. No erythema. No pallor.  Under her abdominal panus, the skin is hyperpigmented. There is no skin breakdown nor satellite lesions. There is no odor.  Psychiatric: She has a normal mood and affect. Her behavior is normal. Judgment and thought content normal.           Assessment & Plan:

## 2016-12-10 NOTE — Assessment & Plan Note (Signed)
This problem is chronic and stable. She had documented B12 deficiency and did a short course of IM supplementation and then transitioned to oral. However she has stopped oral vitamin B12. She does have neuropathy although it is likely diabetic related. I am going to recheck her B12 level today and determine whether to resume B12 supplementation.  PLAN : B12 level

## 2016-12-10 NOTE — Assessment & Plan Note (Signed)
This problem is chronic and stable. She continues to take her Paxil and has good response. She has no symptoms of depression currently.  PLAN:  Cont current meds

## 2016-12-10 NOTE — Telephone Encounter (Signed)
Call made to pharmacy to have gabapentin d/c per pcp's request..Zaydn Gutridge Cassady3/1/20189:49 AM

## 2016-12-10 NOTE — Assessment & Plan Note (Signed)
This problem chronic and stable. Her A1C trend is 7.0 - 6.6 - 7.0. She is on metformin supposed to be twice a day but she says she is taking once a day, Lantus 12 units once a day, Victoza once a day. She continues to have occasional hypoglycemia with some documented CBGs in the 70s. She is symptomatic with these sugars even when they are in the 80s or 90s. We discussed that due to her age, her A1c is too well controlled especially in the light of hypoglycemia. She does have evidence of diabetic complications but I would rather resolve her hypoglycemia then continue such tight glucose control. She is agreeable to decrease her Lantus to 8 units once a day and she will call me if she continues to feel hypoglycemia.  Plan : Decrease Lantus to 8 units once a day Continue metformin and Victoza at current doses She is to call me if she continues to experience hypoglycemia

## 2016-12-10 NOTE — Assessment & Plan Note (Signed)
This problem is chronic and stable. This diagnosis was a clinical diagnosis made after she had good response although temporary to a course of Flagyl. She was seen by gastroenterology who treated her for C diff. Since being treated for C. difficile, the nocturnal fecal incontinence has resolved but she continues to have urgency and intermittent fecal incontinence of watery stools GI intended to repeat a colonoscopy with random biopsies to rule out microscopic colitis if she did not have a good response. She is willing to consider having a colonoscopy and is willing to return to GI to discuss this. She did not start taking fiber supplementation as recommended by GI so I wrote out what she needed to take and she will start this.  Plan : Return to GI for discussion of colonoscopy Fiber supplementation

## 2016-12-10 NOTE — Assessment & Plan Note (Signed)
This problem is chronic and stable. Her BMI is 36. Despite this, she is independent.  Plan:Continue to follow and encourage healthy eating  Wt Readings from Last 3 Encounters:  12/10/16 210 lb 8 oz (95.5 kg)  08/26/16 232 lb (105.2 kg)  08/12/16 212 lb (96.2 kg)

## 2016-12-10 NOTE — Assessment & Plan Note (Signed)
This problem is chronic and stable. She has burning and stinging of her feet to the mid calves bilaterally along with her hands to her wrist. She has been on gabapentin but was not having a good response last year. I stopped the gabapentin and started her on Lyrica and she said she was doing better. However I just discovered that she continues to refill gabapentin and was taking it along with the Lyrica. [name] [name] has now called her pharmacy to discontinue all remaining refills of the gabapentin and she understands today not to continue to take it. Since she is only having a partial response to her Lyrica 50 mg 3 times a day I am going to increase it to 100 mg 3 times a day.  Plan :Increase Lyrica to 100 mg 3 times a day.

## 2016-12-10 NOTE — Patient Instructions (Signed)
1. Decrease your lantus insulin to 8 units 2. Call me if you still have low sugars 3. I will mail you your test results 4. I sent in powder to use as needed for skin under stomach 5. We will schedule appt with your stomach doctor 6. I will try to get your sleeping pill for you 7. Take a fiber supplement daily. One example is metamucil. There are pills, powders, gummis.Marland KitchenMarland Kitchen

## 2016-12-10 NOTE — Assessment & Plan Note (Signed)
This problem is chronic and improved. She has excellent blood pressure control today. Her regimen includes atenolol 100 mg pills one half pill a day, Norvasc 5, Lotensin 40, Lasix 40, and imdur 30. She has no side effects to these medications.  PLAN:  Cont current meds   BP Readings from Last 3 Encounters:  12/10/16 122/60  09/28/16 (!) 151/62  08/26/16 (!) 142/70

## 2016-12-11 ENCOUNTER — Encounter: Payer: Self-pay | Admitting: Internal Medicine

## 2016-12-11 DIAGNOSIS — E1122 Type 2 diabetes mellitus with diabetic chronic kidney disease: Secondary | ICD-10-CM

## 2016-12-11 DIAGNOSIS — N182 Chronic kidney disease, stage 2 (mild): Secondary | ICD-10-CM | POA: Insufficient documentation

## 2016-12-11 DIAGNOSIS — N183 Chronic kidney disease, stage 3 unspecified: Secondary | ICD-10-CM | POA: Insufficient documentation

## 2016-12-11 HISTORY — DX: Type 2 diabetes mellitus with diabetic chronic kidney disease: E11.22

## 2016-12-11 HISTORY — DX: Chronic kidney disease, stage 2 (mild): N18.2

## 2016-12-11 LAB — BMP8+ANION GAP
Anion Gap: 21 mmol/L — ABNORMAL HIGH (ref 10.0–18.0)
BUN/Creatinine Ratio: 32 — ABNORMAL HIGH (ref 12–28)
BUN: 37 mg/dL — ABNORMAL HIGH (ref 8–27)
CO2: 21 mmol/L (ref 18–29)
Calcium: 9.3 mg/dL (ref 8.7–10.3)
Chloride: 100 mmol/L (ref 96–106)
Creatinine, Ser: 1.17 mg/dL — ABNORMAL HIGH (ref 0.57–1.00)
GFR calc Af Amer: 54 mL/min/{1.73_m2} — ABNORMAL LOW (ref >59)
GFR calc non Af Amer: 47 mL/min/{1.73_m2} — ABNORMAL LOW (ref >59)
Glucose: 148 mg/dL — ABNORMAL HIGH (ref 65–99)
Potassium: 4.8 mmol/L (ref 3.5–5.2)
Sodium: 142 mmol/L (ref 134–144)

## 2016-12-11 LAB — CBC
Hematocrit: 35.3 % (ref 34.0–46.6)
Hemoglobin: 11.6 g/dL (ref 11.1–15.9)
MCH: 28.4 pg (ref 26.6–33.0)
MCHC: 32.9 g/dL (ref 31.5–35.7)
MCV: 86 fL (ref 79–97)
Platelets: 338 10*3/uL (ref 150–379)
RBC: 4.09 x10E6/uL (ref 3.77–5.28)
RDW: 15.8 % — ABNORMAL HIGH (ref 12.3–15.4)
WBC: 7.4 10*3/uL (ref 3.4–10.8)

## 2016-12-11 LAB — VITAMIN B12: Vitamin B-12: <150 pg/mL — ABNORMAL LOW (ref 232–1245)

## 2016-12-23 ENCOUNTER — Other Ambulatory Visit: Payer: Self-pay | Admitting: Dietician

## 2016-12-23 DIAGNOSIS — E1165 Type 2 diabetes mellitus with hyperglycemia: Secondary | ICD-10-CM

## 2016-12-23 DIAGNOSIS — IMO0002 Reserved for concepts with insufficient information to code with codable children: Secondary | ICD-10-CM

## 2016-12-23 DIAGNOSIS — E1151 Type 2 diabetes mellitus with diabetic peripheral angiopathy without gangrene: Secondary | ICD-10-CM

## 2016-12-23 MED ORDER — ASSURE COMFORT LANCETS 30G MISC
5 refills | Status: DC
Start: 2016-12-23 — End: 2017-08-10

## 2016-12-23 MED ORDER — GLUCOSE BLOOD VI STRP: ORAL_STRIP | 5 refills | Status: DC

## 2017-02-18 ENCOUNTER — Ambulatory Visit (INDEPENDENT_AMBULATORY_CARE_PROVIDER_SITE_OTHER): Payer: Medicare Other | Admitting: Sports Medicine

## 2017-02-18 VITALS — BP 126/63 | Ht <= 58 in | Wt 232.0 lb

## 2017-02-18 DIAGNOSIS — M4802 Spinal stenosis, cervical region: Secondary | ICD-10-CM

## 2017-02-18 DIAGNOSIS — M5412 Radiculopathy, cervical region: Secondary | ICD-10-CM

## 2017-02-18 NOTE — Progress Notes (Signed)
   Subjective:    Patient ID: [name] [name], female    DOB: Sep 06, 1946, 71 y.o.   MRN: [id number]  HPI chief complaint: Neck pain  [name] [name] is a very pleasant 71 year old female with a known history of cervical spinal stenosis that comes in today complaining of returning neck and bilateral arm pain. Her last cervical epidural steroid injection in February 2017 was very helpful. She only needed the single injection. Her pain returned acutely yesterday without any inciting event. She has great difficulty moving her neck and has radiating pain into both arms. Symptoms are identical in nature to what she has expressed previously. She is taking tramadol but it is not helping.   Review of Systems    as above Objective:   Physical Exam  Well-developed, well-nourished. No acute distress  Cervical spine: Severely limited cervical range of motion in all planes. No tenderness along the midline. Mild spasm of the paraspinal musculature bilaterally.  Neurological exam: 1/4 triceps reflex on the right compared to 2/4 on the left. Brachioradialis and biceps reflexes are 3/4 bilaterally. Good strength. Good pulses.  MRI of her cervical spine from March 2016 shows moderate to severe spinal stenosis.      Assessment & Plan:   Neck pain and bilateral arm pain secondary to cervical spinal stenosis  Patient will be referred for a repeat cervical ESI. Follow-up with me in one month for reevaluation. If she receives no benefit from the injection then I will consider repeat imaging of her cervical spine. If she receives some benefit from the injection then we could consider completing the series. Call with questions or concerns in the interim.

## 2017-02-18 NOTE — Addendum Note (Signed)
Addended by: Cyd Silence on: 02/18/2017 11:27 AM   Modules accepted: Orders

## 2017-02-19 ENCOUNTER — Other Ambulatory Visit: Payer: Self-pay | Admitting: Sports Medicine

## 2017-02-19 ENCOUNTER — Other Ambulatory Visit: Payer: Self-pay | Admitting: *Deleted

## 2017-02-19 DIAGNOSIS — M4802 Spinal stenosis, cervical region: Secondary | ICD-10-CM

## 2017-02-19 DIAGNOSIS — M5412 Radiculopathy, cervical region: Secondary | ICD-10-CM

## 2017-02-19 MED ORDER — DICLOFENAC SODIUM 75 MG PO TBEC: 75.0000 mg | DELAYED_RELEASE_TABLET | Freq: Every day | ORAL | 1 refills | Status: DC

## 2017-02-23 ENCOUNTER — Ambulatory Visit
Admission: RE | Admit: 2017-02-23 | Discharge: 2017-02-23 | Disposition: A | Discharge status: Home / Self Care | Payer: Medicare Other | Source: Ambulatory Visit | Attending: Sports Medicine | Admitting: Sports Medicine

## 2017-02-23 DIAGNOSIS — M4802 Spinal stenosis, cervical region: Secondary | ICD-10-CM

## 2017-02-23 DIAGNOSIS — M5412 Radiculopathy, cervical region: Secondary | ICD-10-CM

## 2017-02-23 NOTE — Discharge Instructions (Signed)

## 2017-02-23 NOTE — Progress Notes (Signed)
Pt vomited while talking to Dr. Maree Erie, pt states the last time she vomited was Sunday and has not been running a temperature.

## 2017-02-24 ENCOUNTER — Inpatient Hospital Stay (HOSPITAL_COMMUNITY): Payer: Medicare Other

## 2017-02-24 ENCOUNTER — Emergency Department (HOSPITAL_COMMUNITY): Payer: Medicare Other

## 2017-02-24 ENCOUNTER — Inpatient Hospital Stay (HOSPITAL_COMMUNITY)
Admission: EM | Admit: 2017-02-24 | Discharge: 2017-03-04 | DRG: 871 | Disposition: A | Discharge status: Home Health | Payer: Medicare Other | Attending: Nephrology | Admitting: Nephrology

## 2017-02-24 ENCOUNTER — Telehealth: Payer: Self-pay | Admitting: *Deleted

## 2017-02-24 DIAGNOSIS — E1140 Type 2 diabetes mellitus with diabetic neuropathy, unspecified: Secondary | ICD-10-CM | POA: Diagnosis present

## 2017-02-24 DIAGNOSIS — J9600 Acute respiratory failure, unspecified whether with hypoxia or hypercapnia: Secondary | ICD-10-CM

## 2017-02-24 DIAGNOSIS — N179 Acute kidney failure, unspecified: Secondary | ICD-10-CM | POA: Diagnosis not present

## 2017-02-24 DIAGNOSIS — I5032 Chronic diastolic (congestive) heart failure: Secondary | ICD-10-CM | POA: Diagnosis present

## 2017-02-24 DIAGNOSIS — N183 Chronic kidney disease, stage 3 (moderate): Secondary | ICD-10-CM | POA: Diagnosis not present

## 2017-02-24 DIAGNOSIS — K219 Gastro-esophageal reflux disease without esophagitis: Secondary | ICD-10-CM | POA: Diagnosis not present

## 2017-02-24 DIAGNOSIS — D631 Anemia in chronic kidney disease: Secondary | ICD-10-CM | POA: Diagnosis present

## 2017-02-24 DIAGNOSIS — E1151 Type 2 diabetes mellitus with diabetic peripheral angiopathy without gangrene: Secondary | ICD-10-CM | POA: Diagnosis not present

## 2017-02-24 DIAGNOSIS — E1122 Type 2 diabetes mellitus with diabetic chronic kidney disease: Secondary | ICD-10-CM | POA: Diagnosis not present

## 2017-02-24 DIAGNOSIS — N171 Acute kidney failure with acute cortical necrosis: Secondary | ICD-10-CM | POA: Diagnosis not present

## 2017-02-24 DIAGNOSIS — Z431 Encounter for attention to gastrostomy: Secondary | ICD-10-CM | POA: Diagnosis not present

## 2017-02-24 DIAGNOSIS — E875 Hyperkalemia: Secondary | ICD-10-CM | POA: Diagnosis not present

## 2017-02-24 DIAGNOSIS — G894 Chronic pain syndrome: Secondary | ICD-10-CM | POA: Diagnosis present

## 2017-02-24 DIAGNOSIS — Z794 Long term (current) use of insulin: Secondary | ICD-10-CM

## 2017-02-24 DIAGNOSIS — R579 Shock, unspecified: Secondary | ICD-10-CM | POA: Diagnosis not present

## 2017-02-24 DIAGNOSIS — G9341 Metabolic encephalopathy: Secondary | ICD-10-CM | POA: Diagnosis not present

## 2017-02-24 DIAGNOSIS — I248 Other forms of acute ischemic heart disease: Secondary | ICD-10-CM | POA: Diagnosis present

## 2017-02-24 DIAGNOSIS — E785 Hyperlipidemia, unspecified: Secondary | ICD-10-CM | POA: Diagnosis not present

## 2017-02-24 DIAGNOSIS — Z978 Presence of other specified devices: Secondary | ICD-10-CM

## 2017-02-24 DIAGNOSIS — Z7982 Long term (current) use of aspirin: Secondary | ICD-10-CM | POA: Diagnosis not present

## 2017-02-24 DIAGNOSIS — R112 Nausea with vomiting, unspecified: Secondary | ICD-10-CM | POA: Diagnosis present

## 2017-02-24 DIAGNOSIS — M25521 Pain in right elbow: Secondary | ICD-10-CM | POA: Diagnosis present

## 2017-02-24 DIAGNOSIS — R6521 Severe sepsis with septic shock: Secondary | ICD-10-CM | POA: Diagnosis not present

## 2017-02-24 DIAGNOSIS — A419 Sepsis, unspecified organism: Principal | ICD-10-CM | POA: Diagnosis present

## 2017-02-24 DIAGNOSIS — I110 Hypertensive heart disease with heart failure: Secondary | ICD-10-CM | POA: Diagnosis present

## 2017-02-24 DIAGNOSIS — E669 Obesity, unspecified: Secondary | ICD-10-CM | POA: Diagnosis present

## 2017-02-24 DIAGNOSIS — I6789 Other cerebrovascular disease: Secondary | ICD-10-CM | POA: Diagnosis not present

## 2017-02-24 DIAGNOSIS — Z6839 Body mass index (BMI) 39.0-39.9, adult: Secondary | ICD-10-CM

## 2017-02-24 DIAGNOSIS — I10 Essential (primary) hypertension: Secondary | ICD-10-CM | POA: Diagnosis not present

## 2017-02-24 DIAGNOSIS — E861 Hypovolemia: Secondary | ICD-10-CM | POA: Diagnosis present

## 2017-02-24 DIAGNOSIS — R1013 Epigastric pain: Secondary | ICD-10-CM | POA: Diagnosis not present

## 2017-02-24 DIAGNOSIS — I350 Nonrheumatic aortic (valve) stenosis: Secondary | ICD-10-CM | POA: Diagnosis not present

## 2017-02-24 DIAGNOSIS — I2510 Atherosclerotic heart disease of native coronary artery without angina pectoris: Secondary | ICD-10-CM | POA: Diagnosis present

## 2017-02-24 DIAGNOSIS — IMO0002 Reserved for concepts with insufficient information to code with codable children: Secondary | ICD-10-CM

## 2017-02-24 DIAGNOSIS — Z833 Family history of diabetes mellitus: Secondary | ICD-10-CM | POA: Diagnosis not present

## 2017-02-24 DIAGNOSIS — J9601 Acute respiratory failure with hypoxia: Secondary | ICD-10-CM | POA: Diagnosis not present

## 2017-02-24 DIAGNOSIS — Z452 Encounter for adjustment and management of vascular access device: Secondary | ICD-10-CM | POA: Diagnosis not present

## 2017-02-24 DIAGNOSIS — D649 Anemia, unspecified: Secondary | ICD-10-CM | POA: Diagnosis not present

## 2017-02-24 DIAGNOSIS — E1165 Type 2 diabetes mellitus with hyperglycemia: Secondary | ICD-10-CM | POA: Diagnosis not present

## 2017-02-24 DIAGNOSIS — Z8249 Family history of ischemic heart disease and other diseases of the circulatory system: Secondary | ICD-10-CM | POA: Diagnosis not present

## 2017-02-24 DIAGNOSIS — Z9289 Personal history of other medical treatment: Secondary | ICD-10-CM | POA: Diagnosis not present

## 2017-02-24 DIAGNOSIS — R4182 Altered mental status, unspecified: Secondary | ICD-10-CM | POA: Diagnosis present

## 2017-02-24 DIAGNOSIS — L989 Disorder of the skin and subcutaneous tissue, unspecified: Secondary | ICD-10-CM | POA: Diagnosis not present

## 2017-02-24 DIAGNOSIS — Z9049 Acquired absence of other specified parts of digestive tract: Secondary | ICD-10-CM

## 2017-02-24 DIAGNOSIS — E860 Dehydration: Secondary | ICD-10-CM | POA: Diagnosis not present

## 2017-02-24 DIAGNOSIS — R4702 Dysphasia: Secondary | ICD-10-CM | POA: Diagnosis not present

## 2017-02-24 DIAGNOSIS — Z66 Do not resuscitate: Secondary | ICD-10-CM | POA: Diagnosis present

## 2017-02-24 DIAGNOSIS — N170 Acute kidney failure with tubular necrosis: Secondary | ICD-10-CM | POA: Diagnosis not present

## 2017-02-24 DIAGNOSIS — I69959 Hemiplegia and hemiparesis following unspecified cerebrovascular disease affecting unspecified side: Secondary | ICD-10-CM | POA: Diagnosis not present

## 2017-02-24 DIAGNOSIS — J9811 Atelectasis: Secondary | ICD-10-CM | POA: Diagnosis not present

## 2017-02-24 DIAGNOSIS — Z4682 Encounter for fitting and adjustment of non-vascular catheter: Secondary | ICD-10-CM | POA: Diagnosis not present

## 2017-02-24 DIAGNOSIS — J40 Bronchitis, not specified as acute or chronic: Secondary | ICD-10-CM | POA: Diagnosis present

## 2017-02-24 DIAGNOSIS — E872 Acidosis: Secondary | ICD-10-CM | POA: Diagnosis present

## 2017-02-24 DIAGNOSIS — E876 Hypokalemia: Secondary | ICD-10-CM | POA: Diagnosis not present

## 2017-02-24 DIAGNOSIS — R531 Weakness: Secondary | ICD-10-CM | POA: Diagnosis not present

## 2017-02-24 DIAGNOSIS — E1129 Type 2 diabetes mellitus with other diabetic kidney complication: Secondary | ICD-10-CM | POA: Diagnosis not present

## 2017-02-24 DIAGNOSIS — J9690 Respiratory failure, unspecified, unspecified whether with hypoxia or hypercapnia: Secondary | ICD-10-CM | POA: Diagnosis not present

## 2017-02-24 DIAGNOSIS — E8770 Fluid overload, unspecified: Secondary | ICD-10-CM | POA: Diagnosis not present

## 2017-02-24 DIAGNOSIS — R079 Chest pain, unspecified: Secondary | ICD-10-CM | POA: Diagnosis not present

## 2017-02-24 LAB — CBC WITH DIFFERENTIAL/PLATELET
Basophils Absolute: 0 10*3/uL (ref 0.0–0.1)
Basophils Relative: 0 %
Eosinophils Absolute: 0 10*3/uL (ref 0.0–0.7)
Eosinophils Relative: 0 %
HCT: 31.2 % — ABNORMAL LOW (ref 36.0–46.0)
Hemoglobin: 10.2 g/dL — ABNORMAL LOW (ref 12.0–15.0)
Lymphocytes Relative: 17 %
Lymphs Abs: 4.7 10*3/uL — ABNORMAL HIGH (ref 0.7–4.0)
MCH: 28.2 pg (ref 26.0–34.0)
MCHC: 32.7 g/dL (ref 30.0–36.0)
MCV: 86.2 fL (ref 78.0–100.0)
Monocytes Absolute: 0.8 10*3/uL (ref 0.1–1.0)
Monocytes Relative: 3 %
Neutro Abs: 22.3 10*3/uL — ABNORMAL HIGH (ref 1.7–7.7)
Neutrophils Relative %: 80 %
Platelets: 419 10*3/uL — ABNORMAL HIGH (ref 150–400)
RBC: 3.62 MIL/uL — ABNORMAL LOW (ref 3.87–5.11)
RDW: 15.5 % (ref 11.5–15.5)
WBC: 27.8 10*3/uL — ABNORMAL HIGH (ref 4.0–10.5)

## 2017-02-24 LAB — COMPREHENSIVE METABOLIC PANEL
ALT: 14 U/L (ref 14–54)
AST: 26 U/L (ref 15–41)
Albumin: 3 g/dL — ABNORMAL LOW (ref 3.5–5.0)
Alkaline Phosphatase: 80 U/L (ref 38–126)
BUN: 86 mg/dL — ABNORMAL HIGH (ref 6–20)
CO2: <7 mmol/L — ABNORMAL LOW (ref 22–32)
Calcium: 8.2 mg/dL — ABNORMAL LOW (ref 8.9–10.3)
Chloride: 102 mmol/L (ref 101–111)
Creatinine, Ser: 10.48 mg/dL — ABNORMAL HIGH (ref 0.44–1.00)
GFR calc Af Amer: 4 mL/min — ABNORMAL LOW (ref >60)
GFR calc non Af Amer: 3 mL/min — ABNORMAL LOW (ref >60)
Glucose, Bld: 84 mg/dL (ref 65–99)
Potassium: 6 mmol/L — ABNORMAL HIGH (ref 3.5–5.1)
Sodium: 140 mmol/L (ref 135–145)
Total Bilirubin: 0.5 mg/dL (ref 0.3–1.2)
Total Protein: 7.5 g/dL (ref 6.5–8.1)

## 2017-02-24 LAB — URINALYSIS, ROUTINE W REFLEX MICROSCOPIC
Bilirubin Urine: NEGATIVE
Glucose, UA: NEGATIVE mg/dL
Ketones, ur: 5 mg/dL — AB
Leukocytes, UA: NEGATIVE
Nitrite: NEGATIVE
Protein, ur: 30 mg/dL — AB
Specific Gravity, Urine: 1.009 (ref 1.005–1.030)
pH: 5 (ref 5.0–8.0)

## 2017-02-24 LAB — I-STAT CG4 LACTIC ACID, ED
Lactic Acid, Venous: 10.79 mmol/L (ref 0.5–1.9)
Lactic Acid, Venous: 10.64 mmol/L (ref 0.5–1.9)

## 2017-02-24 LAB — I-STAT ARTERIAL BLOOD GAS, ED
Acid-base deficit: 28 mmol/L — ABNORMAL HIGH (ref 0.0–2.0)
Bicarbonate: 2.6 mmol/L — ABNORMAL LOW (ref 20.0–28.0)
O2 Saturation: 86 %
Patient temperature: 93.1
TCO2: <5 mmol/L (ref 0–100)
pCO2 arterial: <14 mmHg — CL (ref 32.0–48.0)
pH, Arterial: 6.894 — CL (ref 7.350–7.450)
pO2, Arterial: 72 mmHg — ABNORMAL LOW (ref 83.0–108.0)

## 2017-02-24 LAB — I-STAT CHEM 8, ED
BUN: 93 mg/dL — ABNORMAL HIGH (ref 6–20)
Calcium, Ion: 1.04 mmol/L — ABNORMAL LOW (ref 1.15–1.40)
Chloride: 110 mmol/L (ref 101–111)
Creatinine, Ser: 10.8 mg/dL — ABNORMAL HIGH (ref 0.44–1.00)
Glucose, Bld: 164 mg/dL — ABNORMAL HIGH (ref 65–99)
HCT: 28 % — ABNORMAL LOW (ref 36.0–46.0)
Hemoglobin: 9.5 g/dL — ABNORMAL LOW (ref 12.0–15.0)
Potassium: 5.5 mmol/L — ABNORMAL HIGH (ref 3.5–5.1)
Sodium: 139 mmol/L (ref 135–145)
TCO2: 6 mmol/L (ref 0–100)

## 2017-02-24 LAB — PROTIME-INR
INR: 1.36
Prothrombin Time: 16.9 seconds — ABNORMAL HIGH (ref 11.4–15.2)

## 2017-02-24 LAB — CBG MONITORING, ED
Glucose-Capillary: 82 mg/dL (ref 65–99)
Glucose-Capillary: 187 mg/dL — ABNORMAL HIGH (ref 65–99)
Glucose-Capillary: 152 mg/dL — ABNORMAL HIGH (ref 65–99)
Glucose-Capillary: 176 mg/dL — ABNORMAL HIGH (ref 65–99)
Glucose-Capillary: 158 mg/dL — ABNORMAL HIGH (ref 65–99)

## 2017-02-24 LAB — GLUCOSE, CAPILLARY: Glucose-Capillary: 166 mg/dL — ABNORMAL HIGH (ref 65–99)

## 2017-02-24 LAB — TROPONIN I: Troponin I: <0.03 ng/mL (ref <0.03)

## 2017-02-24 LAB — MAGNESIUM: Magnesium: 1.7 mg/dL (ref 1.7–2.4)

## 2017-02-24 LAB — PHOSPHORUS: Phosphorus: 12.6 mg/dL — ABNORMAL HIGH (ref 2.5–4.6)

## 2017-02-24 LAB — LIPASE, BLOOD: Lipase: 205 U/L — ABNORMAL HIGH (ref 11–51)

## 2017-02-24 LAB — CK: Total CK: 62 U/L (ref 38–234)

## 2017-02-24 LAB — LACTIC ACID, PLASMA: Lactic Acid, Venous: 10.7 mmol/L (ref 0.5–1.9)

## 2017-02-24 MED ORDER — VANCOMYCIN HCL IN DEXTROSE 1-5 GM/200ML-% IV SOLN: 1000.0000 mg | Freq: Once | INTRAVENOUS | Status: DC

## 2017-02-24 MED ORDER — NOREPINEPHRINE BITARTRATE 1 MG/ML IV SOLN
0.0000 ug/min | INTRAVENOUS | Status: DC
  Administered 2017-02-25 (×2): 14 ug/min via INTRAVENOUS
  Administered 2017-02-25: 24 ug/min via INTRAVENOUS
  Administered 2017-02-25: 28 ug/min via INTRAVENOUS
  Filled 2017-02-24 (×3): qty 4

## 2017-02-24 MED ORDER — SODIUM CHLORIDE 0.9 % IV BOLUS (SEPSIS)
1000.0000 mL | Freq: Once | INTRAVENOUS | Status: AC
  Administered 2017-02-24: 1000 mL via INTRAVENOUS

## 2017-02-24 MED ORDER — CALCIUM GLUCONATE 10 % IV SOLN
1.0000 g | Freq: Once | INTRAVENOUS | Status: AC
  Administered 2017-02-24: 1 g via INTRAVENOUS
  Filled 2017-02-24: qty 10

## 2017-02-24 MED ORDER — SODIUM BICARBONATE 8.4 % IV SOLN
INTRAVENOUS | Status: DC
  Administered 2017-02-24 – 2017-02-25 (×2): via INTRAVENOUS
  Filled 2017-02-24 (×4): qty 150

## 2017-02-24 MED ORDER — SODIUM BICARBONATE 8.4 % IV SOLN
50.0000 meq | Freq: Once | INTRAVENOUS | Status: AC
  Administered 2017-02-24: 50 meq via INTRAVENOUS
  Filled 2017-02-24: qty 50

## 2017-02-24 MED ORDER — SODIUM CHLORIDE 0.9 % IV BOLUS (SEPSIS): 1000.0000 mL | Freq: Once | INTRAVENOUS | Status: DC

## 2017-02-24 MED ORDER — NOREPINEPHRINE BITARTRATE 1 MG/ML IV SOLN
0.0000 ug/min | INTRAVENOUS | Status: DC
  Filled 2017-02-24: qty 16

## 2017-02-24 MED ORDER — CALCIUM GLUCONATE 10 % IV SOLN
INTRAVENOUS | Status: AC
  Administered 2017-02-24: 1 g
  Filled 2017-02-24: qty 10

## 2017-02-24 MED ORDER — ATROPINE SULFATE 1 MG/ML IJ SOLN
0.5000 mg | Freq: Once | INTRAMUSCULAR | Status: AC
  Administered 2017-02-24: 0.5 mg via INTRAVENOUS

## 2017-02-24 MED ORDER — INSULIN ASPART 100 UNIT/ML IV SOLN
5.0000 [IU] | Freq: Once | INTRAVENOUS | Status: AC
  Administered 2017-02-24: 5 [IU] via INTRAVENOUS
  Filled 2017-02-24: qty 0.05

## 2017-02-24 MED ORDER — PRISMASOL BGK 4/2.5 32-4-2.5 MEQ/L IV SOLN
INTRAVENOUS | Status: DC
  Administered 2017-02-24 – 2017-02-27 (×9): via INTRAVENOUS_CENTRAL
  Filled 2017-02-24 (×16): qty 5000

## 2017-02-24 MED ORDER — HEPARIN BOLUS VIA INFUSION (CRRT)
1000.0000 [IU] | INTRAVENOUS | Status: DC | PRN
  Filled 2017-02-24: qty 1000

## 2017-02-24 MED ORDER — SODIUM CHLORIDE 0.9 % IJ SOLN
250.0000 [IU]/h | INTRAMUSCULAR | Status: DC
  Administered 2017-02-24: 1000 [IU]/h via INTRAVENOUS_CENTRAL
  Administered 2017-02-25: 1150 [IU]/h via INTRAVENOUS_CENTRAL
  Administered 2017-02-25: 1200 [IU]/h via INTRAVENOUS_CENTRAL
  Administered 2017-02-26: 1100 [IU]/h via INTRAVENOUS_CENTRAL
  Filled 2017-02-24 (×7): qty 2

## 2017-02-24 MED ORDER — VANCOMYCIN HCL 10 G IV SOLR
2000.0000 mg | Freq: Once | INTRAVENOUS | Status: AC
  Administered 2017-02-24: 2000 mg via INTRAVENOUS
  Filled 2017-02-24: qty 2000

## 2017-02-24 MED ORDER — SODIUM BICARBONATE 8.4 % IV SOLN
50.0000 meq | Freq: Once | INTRAVENOUS | Status: AC
  Administered 2017-02-24: 50 meq via INTRAVENOUS

## 2017-02-24 MED ORDER — STERILE WATER FOR INJECTION IV SOLN
INTRAVENOUS | Status: DC
  Administered 2017-02-24: via INTRAVENOUS_CENTRAL
  Filled 2017-02-24 (×5): qty 150

## 2017-02-24 MED ORDER — MIDAZOLAM HCL 2 MG/2ML IJ SOLN
INTRAMUSCULAR | Status: AC
  Administered 2017-02-24: 2 mg
  Filled 2017-02-24: qty 2

## 2017-02-24 MED ORDER — SODIUM CHLORIDE 0.9 % FOR CRRT
INTRAVENOUS_CENTRAL | Status: DC | PRN
  Administered 2017-02-24: 1000 mL via INTRAVENOUS_CENTRAL
  Filled 2017-02-24: qty 1000

## 2017-02-24 MED ORDER — NOREPINEPHRINE BITARTRATE 1 MG/ML IV SOLN
0.0000 ug/min | INTRAVENOUS | Status: DC
  Administered 2017-02-24: 40 ug/min via INTRAVENOUS
  Administered 2017-02-24: 20 ug/min via INTRAVENOUS
  Filled 2017-02-24 (×2): qty 4

## 2017-02-24 MED ORDER — INSULIN ASPART 100 UNIT/ML IV SOLN: 5.0000 [IU] | Freq: Once | INTRAVENOUS | Status: DC

## 2017-02-24 MED ORDER — HEPARIN SODIUM (PORCINE) 1000 UNIT/ML DIALYSIS
1000.0000 [IU] | INTRAMUSCULAR | Status: DC | PRN
  Administered 2017-02-27: 3000 [IU] via INTRAVENOUS_CENTRAL
  Filled 2017-02-24 (×2): qty 6
  Filled 2017-02-24: qty 3

## 2017-02-24 MED ORDER — SODIUM CHLORIDE 0.9 % IV SOLN
1.0000 g | Freq: Once | INTRAVENOUS | Status: DC
  Filled 2017-02-24: qty 10

## 2017-02-24 MED ORDER — ROCURONIUM BROMIDE 50 MG/5ML IV SOLN
50.0000 mg | Freq: Once | INTRAVENOUS | Status: AC
  Administered 2017-02-24: 50 mg via INTRAVENOUS

## 2017-02-24 MED ORDER — CALCIUM GLUCONATE 10 % IV SOLN: 1.0000 g | Freq: Once | INTRAVENOUS | Status: DC

## 2017-02-24 MED ORDER — PIPERACILLIN-TAZOBACTAM IN DEX 2-0.25 GM/50ML IV SOLN
2.2500 g | Freq: Three times a day (TID) | INTRAVENOUS | Status: DC
  Filled 2017-02-24 (×2): qty 50

## 2017-02-24 MED ORDER — INSULIN ASPART 100 UNIT/ML ~~LOC~~ SOLN
5.0000 [IU] | Freq: Once | SUBCUTANEOUS | Status: AC
Start: 2017-02-24 — End: 2017-02-24
  Administered 2017-02-24: 5 [IU] via INTRAVENOUS
  Filled 2017-02-24 (×2): qty 1

## 2017-02-24 MED ORDER — FENTANYL CITRATE (PF) 100 MCG/2ML IJ SOLN
INTRAMUSCULAR | Status: AC
  Administered 2017-02-24: 23:00:00
  Filled 2017-02-24: qty 2

## 2017-02-24 MED ORDER — INSULIN ASPART 100 UNIT/ML ~~LOC~~ SOLN
SUBCUTANEOUS | Status: AC
  Filled 2017-02-24: qty 1

## 2017-02-24 MED ORDER — ATROPINE SULFATE 1 MG/10ML IJ SOSY
0.5000 mg | PREFILLED_SYRINGE | Freq: Once | INTRAMUSCULAR | Status: AC
  Administered 2017-02-24: 0.5 mg via INTRAVENOUS

## 2017-02-24 MED ORDER — PRISMASOL BGK 4/2.5 32-4-2.5 MEQ/L IV SOLN
INTRAVENOUS | Status: DC
  Administered 2017-02-24 – 2017-02-27 (×20): via INTRAVENOUS_CENTRAL
  Filled 2017-02-24 (×38): qty 5000

## 2017-02-24 MED ORDER — SODIUM BICARBONATE 8.4 % IV SOLN
INTRAVENOUS | Status: AC
  Administered 2017-02-24: 23:00:00
  Filled 2017-02-24: qty 150

## 2017-02-24 MED ORDER — SODIUM CHLORIDE 0.9 % IV BOLUS (SEPSIS)
2000.0000 mL | Freq: Once | INTRAVENOUS | Status: AC
  Administered 2017-02-24: 2000 mL via INTRAVENOUS

## 2017-02-24 MED ORDER — SODIUM CHLORIDE 0.9 % IV BOLUS (SEPSIS)
500.0000 mL | Freq: Once | INTRAVENOUS | Status: AC
  Administered 2017-02-24: 500 mL via INTRAVENOUS

## 2017-02-24 MED ORDER — ATROPINE SULFATE 1 MG/10ML IJ SOSY
1.0000 mg | PREFILLED_SYRINGE | Freq: Once | INTRAMUSCULAR | Status: AC
  Administered 2017-02-24: 1 mg via INTRAVENOUS

## 2017-02-24 MED ORDER — CALCIUM GLUCONATE 10 % IV SOLN
INTRAVENOUS | Status: AC
  Administered 2017-02-24: 21:00:00
  Filled 2017-02-24: qty 10

## 2017-02-24 MED ORDER — DEXTROSE 50 % IV SOLN
1.0000 | Freq: Once | INTRAVENOUS | Status: AC
  Administered 2017-02-24: 50 mL via INTRAVENOUS
  Filled 2017-02-24: qty 50

## 2017-02-24 MED ORDER — SODIUM CHLORIDE 0.9 % IV SOLN
1.0000 g | Freq: Once | INTRAVENOUS | Status: AC
  Administered 2017-02-24: 1 g via INTRAVENOUS
  Filled 2017-02-24: qty 10

## 2017-02-24 MED ORDER — CALCIUM GLUCONATE 10 % IV SOLN
1.0000 g | Freq: Once | INTRAVENOUS | Status: AC
  Administered 2017-02-24: 1 g via INTRAVENOUS

## 2017-02-24 MED ORDER — HEPARIN SODIUM (PORCINE) 5000 UNIT/ML IJ SOLN
5000.0000 [IU] | Freq: Three times a day (TID) | INTRAMUSCULAR | Status: DC
  Administered 2017-02-25 – 2017-03-04 (×23): 5000 [IU] via SUBCUTANEOUS
  Filled 2017-02-24 (×21): qty 1

## 2017-02-24 MED ORDER — SODIUM BICARBONATE 8.4 % IV SOLN
50.0000 meq | INTRAVENOUS | Status: AC
  Administered 2017-02-24: 50 meq via INTRAVENOUS
  Filled 2017-02-24: qty 100

## 2017-02-24 MED ORDER — ETOMIDATE 2 MG/ML IV SOLN
20.0000 mg | Freq: Once | INTRAVENOUS | Status: AC
  Administered 2017-02-24: 20 mg via INTRAVENOUS

## 2017-02-24 MED ORDER — PIPERACILLIN-TAZOBACTAM 3.375 G IVPB
3.3750 g | Freq: Four times a day (QID) | INTRAVENOUS | Status: DC
  Administered 2017-02-25 – 2017-02-26 (×7): 3.375 g via INTRAVENOUS
  Filled 2017-02-24 (×7): qty 50

## 2017-02-24 MED ORDER — PIPERACILLIN-TAZOBACTAM 3.375 G IVPB 30 MIN
3.3750 g | Freq: Once | INTRAVENOUS | Status: AC
  Administered 2017-02-24: 3.375 g via INTRAVENOUS
  Filled 2017-02-24: qty 50

## 2017-02-24 MED ORDER — SODIUM BICARBONATE 8.4 % IV SOLN
100.0000 meq | Freq: Once | INTRAVENOUS | Status: AC
  Administered 2017-02-24: 100 meq via INTRAVENOUS

## 2017-02-24 MED ORDER — VANCOMYCIN HCL IN DEXTROSE 1-5 GM/200ML-% IV SOLN
1000.0000 mg | INTRAVENOUS | Status: DC
  Administered 2017-02-25: 1000 mg via INTRAVENOUS
  Filled 2017-02-24: qty 200

## 2017-02-24 MED ORDER — SODIUM CHLORIDE 0.9 % IV SOLN
250.0000 mL | INTRAVENOUS | Status: DC | PRN
  Administered 2017-02-25: 250 mL via INTRAVENOUS

## 2017-02-24 MED ORDER — VASOPRESSIN 20 UNIT/ML IV SOLN
0.0300 [IU]/min | INTRAVENOUS | Status: DC
  Administered 2017-02-24: 0.03 [IU]/min via INTRAVENOUS
  Filled 2017-02-24: qty 2

## 2017-02-24 MED ORDER — ONDANSETRON HCL 4 MG/2ML IJ SOLN: 4.0000 mg | Freq: Four times a day (QID) | INTRAMUSCULAR | Status: DC | PRN

## 2017-02-24 MED ORDER — INSULIN ASPART 100 UNIT/ML ~~LOC~~ SOLN
0.0000 [IU] | SUBCUTANEOUS | Status: DC
  Administered 2017-02-24 – 2017-02-25 (×2): 3 [IU] via SUBCUTANEOUS
  Administered 2017-02-25 (×2): 5 [IU] via SUBCUTANEOUS
  Administered 2017-02-25 – 2017-02-26 (×4): 3 [IU] via SUBCUTANEOUS
  Administered 2017-02-26 – 2017-02-27 (×5): 5 [IU] via SUBCUTANEOUS
  Administered 2017-02-27: 3 [IU] via SUBCUTANEOUS
  Administered 2017-02-27: 8 [IU] via SUBCUTANEOUS
  Administered 2017-02-27: 2 [IU] via SUBCUTANEOUS
  Administered 2017-02-28: 3 [IU] via SUBCUTANEOUS
  Administered 2017-02-28: 2 [IU] via SUBCUTANEOUS

## 2017-02-24 NOTE — ED Notes (Signed)
Dr. Rolan Lipa not  in office RN [facility] notified of lactic acid of 10.64

## 2017-02-24 NOTE — ED Notes (Signed)
Patient transported to CT 

## 2017-02-24 NOTE — Progress Notes (Addendum)
Pharmacy Antibiotic Note  MEGGEN SPAZIANI is a 71 y.o. female admitted on 02/24/2017 with sepsis.  Pharmacy has been consulted for vancomycin and Zosyn dosing. Patient is hypothermic, WBC elevated at 27.8, lactic acid 10.79, SCr 10.48 (baseline ~1) with CrCl ~4 ml/min  Plan: Vancomycin 2000 mg x1. Obtain vanc random in 48 hours and assess renal function for further dosing Zosyn 3.375g x1 then 2.25g IV q8h (4 hour infusion). Monitor renal function, cultures, ability to de-escalate    Temp (24hrs), Avg:95 F (35 C), Min:95 F (35 C), Max:95 F (35 C)   Recent Labs Lab 02/24/17 1609  LATICACIDVEN 10.79*    CrCl cannot be calculated (Patient's most recent lab result is older than the maximum 21 days allowed.).    No Known Allergies  Antimicrobials this admission: 5/16 vanc >>  5/16 Zosyn >>   Dose adjustments this admission: n/a  Microbiology results: 5/16 BCx: sent  Thank you for allowing pharmacy to be a part of this patient's care.   Gwenlyn Perking, PharmD PGY1 Pharmacy Resident Rx ED [phone] 02/24/2017 4:22 PM

## 2017-02-24 NOTE — Telephone Encounter (Signed)
Patient's dtr Clarene Critchley) called very concerned because patient having slurred speech & @ the moment was unable to walk which is a new occurrence for her. Stated " I think she's having a stroke, should I take her to the ER?" Advised that patient needs to be seen ASAP in ED, verbalized understanding & will call ambulance.

## 2017-02-24 NOTE — Progress Notes (Signed)
   02/24/17 2300  Clinical Encounter Type  Visited With Patient and family together;Health care provider  Visit Type Initial;Psychological support;Social support;Critical Care;ED  Referral From Physician  Consult/Referral To Physician;Faith community  Spiritual Encounters  Spiritual Needs Emotional  Stress Factors  Patient Stress Factors None identified  Family Stress Factors Loss of control    Physician asked chaplain to stand by to give support to family due to the fact that the Pt was not doing well. [place] provided empathic listening while family expressed their disappointments and laments.  [place] also initiated a relationship of care and support. Family supported each other in their coping. Overall, [facility] provided silent and supportive presence.

## 2017-02-24 NOTE — Consult Note (Signed)
Reason for Consult:AKI, acidemia Referring Physician: ED  [name] [name] is an 71 y.o. female.  HPI: 71 yr female with long term DM, HTN according to family.  Hx CAD with Cath 6283, diastolic CHF but EF >66%, DJD, Cervical stenosis, lumbar disc dz, DM neuropathy, ^ Lipids, obesity, & depression.  Apparently N, V, D about a week according to daughter (patient cannot give hx but is arousable).  This am confused so brought to ED.  Found to have septic picture  ?? Cause with AGMA, bicarb <7, k of 6 and Cr of 10.8.(Cr 1.1 on 12/10/16).  Has Phos of 12.6 but CK of only 62.  Has been on many meds including benazepril, and diclofenac. Review of systems not obtained due to patient factors.   Past Medical History:  Diagnosis Date  . Arthritis   . CAD 08/26/2006   Cath 07/05:multiple areas of nonobstructive disease = medical & RF mgmt, well preserved global systolic function. Dr. Lia Foyer. Nuclear med stress test 01/2014 : Normal stress nuclear study. LV Ejection Fraction: 76%. LV Wall Motion: NL LV Function; NL Wall Motion.      . Chronic pain syndrome 01/03/2013   Multifactorial. Non opioid requiring.   L2-5 fusion, with hardware at L2-3, stable per MRI 2010. Followed by neurosurgery (Dr. 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    [name] DEPRESSION 08/26/2006   On paxil    . DIABETES MELLITUS, TYPE II 08/26/2006   Insulin dependent. Victoza added 03/2014  . DYSLIPIDEMIA 11/01/2006   LDL goal < 100, 70 if can achieve without side effects.     . Gallstones   . GERD 08/26/2006   Heartburn QHS.     [name] HYPERTENSION 08/26/2006   On BB, ACEI, lasix, norvasc, metolazone, and imdur.     . OBESITY 08/26/2006   BMI 39.5. Obesity Class 2.     . PVD (peripheral vascular disease) ([place]) 03/27/2014   2015 LE arterial Duplex - Possible mild inflow disease on the left. 0-49% bilateral SFA disease, without focal stenosis. Three vesel run-off, bilaterally. Medical  tx.     Past Surgical History:  Procedure Laterality Date  . CHOLECYSTECTOMY     pt denies 04/07/16  . endometrial biospy    . LEFT HEART CATH    . lumbar fusion surgery  10/08   L3-L5  . posterior lumbar interfusion surgery  07/18/07   L2-L3  . rotator cuff surgery Bilateral     Family History  Problem Relation Age of Onset  . Cirrhosis Mother   . Diabetes Father   . Hypertension Father   . Hypertension Sister   . Diabetes Sister   . Hyperlipidemia Sister     Social History:  reports that she has never smoked. She has never used smokeless tobacco. She reports that she does not drink alcohol or use drugs.  Allergies: No Known Allergies  Medications:  I have reviewed the patient's current medications. Prior to Admission:  Prescriptions Prior to Admission  Medication Sig Dispense Refill Last Dose  . Acetaminophen 650 MG TABS Take 650 mg by mouth 3 (three) times daily.   Taking  . amLODipine (NORVASC) 5 MG tablet Take 1 tablet (5 mg total) by mouth daily. 90 tablet 3   . aspirin 81 MG tablet Take 1 tablet (81 mg total) by mouth daily. 90 tablet 3 Taking  . ASSURE COMFORT LANCETS 30G MISC E11.59. Use to check blood sugar three times  a day 100 each 5   . atenolol (TENORMIN) 100 MG tablet Take 0.5 tablets (50 mg total) by mouth daily. 45 tablet 3   . atorvastatin (LIPITOR) 10 MG tablet Take 1 tablet (10 mg total) by mouth daily. 90 tablet 3   . benazepril (LOTENSIN) 40 MG tablet Take 1 tablet (40 mg total) by mouth daily. 90 tablet 3   . calcium citrate-vitamin D 500-400 MG-UNIT chewable tablet Chew 1 tablet by mouth daily.   Taking  . diclofenac (VOLTAREN) 75 MG EC tablet Take 1 tablet (75 mg total) by mouth daily. 30 tablet 1   . diclofenac sodium (VOLTAREN) 1 % GEL Apply 4 g topically 4 (four) times daily. Apply small amount to affected area up to 4 times a day. 100 g 5 Taking  . furosemide (LASIX) 40 MG tablet Take 1 tablet (40 mg total) by mouth daily. 90 tablet 3   .  glucose blood (ACCU-CHEK AVIVA PLUS) test strip E11.59. Use to check blood sugar three times a day 100 each 5   . Insulin Glargine (LANTUS SOLOSTAR) 100 UNIT/ML Solostar Pen Inject 8 Units into the skin daily at 10 pm. 3 pen 3   . isosorbide mononitrate (IMDUR) 30 MG 24 hr tablet Take 1 tablet (30 mg total) by mouth daily. 90 tablet 3   . lidocaine (XYLOCAINE) 5 % ointment Apply 2 grams up to 4 times daily to affected area 744.24 g 3 Taking  . loratadine (CLARITIN) 10 MG tablet take 1 tablet by mouth once daily 30 tablet 11 Taking  . LYRICA 50 MG capsule   0   . magnesium chloride (SLOW-MAG) 64 MG TBEC SR tablet Take 1 tablet (64 mg total) by mouth daily. 180 tablet 3 Taking  . metFORMIN (GLUCOPHAGE) 1000 MG tablet take 1 tablet by mouth twice a day 180 tablet 3 Taking  . nitroGLYCERIN (NITROSTAT) 0.4 MG SL tablet Place 1 tablet (0.4 mg total) under the tongue every 5 (five) minutes as needed for chest pain. 25 tablet 3   . NOVOFINE PLUS 32G X 4 MM MISC USE TO INJECT INSULIN 100 each 11   . nystatin (NYSTATIN) powder Apply topically 4 (four) times daily. 15 g 0   . PARoxetine (PAXIL) 40 MG tablet take 1 tablet by mouth every morning 90 tablet 3 Taking  . potassium chloride (K-DUR) 10 MEQ tablet Take 1 tablet (10 mEq total) by mouth daily. 90 tablet 3   . pregabalin (LYRICA) 100 MG capsule Take 1 capsule (100 mg total) by mouth 3 (three) times daily. 270 capsule 3   . ranitidine (ZANTAC) 150 MG tablet Take 1 tablet (150 mg total) by mouth 2 (two) times daily. 180 tablet 3 Taking  . traMADol (ULTRAM) 50 MG tablet Take 1 tablet (50 mg total) by mouth every 12 (twelve) hours as needed. 60 tablet 2   . VICTOZA 18 MG/3ML SOPN inject 1.2 milligram subcutaneously daily 3 mL 11   . zolpidem (AMBIEN CR) 6.25 MG CR tablet Take 1 tablet (6.25 mg total) by mouth at bedtime as needed for sleep. 90 tablet 3     Results for orders placed or performed during the hospital encounter of 02/24/17 (from the past 48  hour(s))  CBG monitoring, ED     Status: None   Collection Time: 02/24/17  3:34 PM  Result Value Ref Range   Glucose-Capillary 82 65 - 99 mg/dL  CBC with Differential     Status: Abnormal   Collection Time:  02/24/17  4:00 PM  Result Value Ref Range   WBC 27.8 (H) 4.0 - 10.5 K/uL   RBC 3.62 (L) 3.87 - 5.11 MIL/uL   Hemoglobin 10.2 (L) 12.0 - 15.0 g/dL   HCT 31.2 (L) 36.0 - 46.0 %   MCV 86.2 78.0 - 100.0 fL   MCH 28.2 26.0 - 34.0 pg   MCHC 32.7 30.0 - 36.0 g/dL   RDW 15.5 11.5 - 15.5 %   Platelets 419 (H) 150 - 400 K/uL   Neutrophils Relative % 80 %   Lymphocytes Relative 17 %   Monocytes Relative 3 %   Eosinophils Relative 0 %   Basophils Relative 0 %   Neutro Abs 22.3 (H) 1.7 - 7.7 K/uL   Lymphs Abs 4.7 (H) 0.7 - 4.0 K/uL   Monocytes Absolute 0.8 0.1 - 1.0 K/uL   Eosinophils Absolute 0.0 0.0 - 0.7 K/uL   Basophils Absolute 0.0 0.0 - 0.1 K/uL   WBC Morphology MILD LEFT SHIFT (1-5% METAS, OCC MYELO, OCC BANDS)   Comprehensive metabolic panel     Status: Abnormal   Collection Time: 02/24/17  4:00 PM  Result Value Ref Range   Sodium 140 135 - 145 mmol/L   Potassium 6.0 (H) 3.5 - 5.1 mmol/L   Chloride 102 101 - 111 mmol/L   CO2 <7 (L) 22 - 32 mmol/L   Glucose, Bld 84 65 - 99 mg/dL   BUN 86 (H) 6 - 20 mg/dL   Creatinine, Ser 10.48 (H) 0.44 - 1.00 mg/dL   Calcium 8.2 (L) 8.9 - 10.3 mg/dL   Total Protein 7.5 6.5 - 8.1 g/dL   Albumin 3.0 (L) 3.5 - 5.0 g/dL   AST 26 15 - 41 U/L   ALT 14 14 - 54 U/L   Alkaline Phosphatase 80 38 - 126 U/L   Total Bilirubin 0.5 0.3 - 1.2 mg/dL   GFR calc non Af Amer 3 (L) >60 mL/min   GFR calc Af Amer 4 (L) >60 mL/min    Comment: (NOTE) The eGFR has been calculated using the CKD EPI equation. This calculation has not been validated in all clinical situations. eGFR's persistently <60 mL/min signify possible Chronic Kidney Disease.    Anion gap NOT CALCULATED 5 - 15  Troponin I     Status: None   Collection Time: 02/24/17  4:00 PM  Result  Value Ref Range   Troponin I <0.03 <0.03 ng/mL  Protime-INR     Status: Abnormal   Collection Time: 02/24/17  4:00 PM  Result Value Ref Range   Prothrombin Time 16.9 (H) 11.4 - 15.2 seconds   INR 1.36   Lipase, blood     Status: Abnormal   Collection Time: 02/24/17  4:00 PM  Result Value Ref Range   Lipase 205 (H) 11 - 51 U/L  I-Stat CG4 Lactic Acid, ED     Status: Abnormal   Collection Time: 02/24/17  4:09 PM  Result Value Ref Range   Lactic Acid, Venous 10.79 (HH) 0.5 - 1.9 mmol/L   Comment NOTIFIED PHYSICIAN   Lactic acid, plasma     Status: Abnormal   Collection Time: 02/24/17  4:40 PM  Result Value Ref Range   Lactic Acid, Venous 10.7 (HH) 0.5 - 1.9 mmol/L    Comment: CRITICAL RESULT CALLED TO, READ BACK BY AND VERIFIED WITH: P MCCORD,RN 1735 02/24/2017 WBOND   CBG monitoring, ED     Status: Abnormal   Collection Time: 02/24/17  6:16 PM  Result Value Ref Range   Glucose-Capillary 187 (H) 65 - 99 mg/dL  Urinalysis, Routine w reflex microscopic     Status: Abnormal   Collection Time: 02/24/17  6:36 PM  Result Value Ref Range   Color, Urine YELLOW YELLOW   APPearance HAZY (A) CLEAR   Specific Gravity, Urine 1.009 1.005 - 1.030   pH 5.0 5.0 - 8.0   Glucose, UA NEGATIVE NEGATIVE mg/dL   Hgb urine dipstick SMALL (A) NEGATIVE   Bilirubin Urine NEGATIVE NEGATIVE   Ketones, ur 5 (A) NEGATIVE mg/dL   Protein, ur 30 (A) NEGATIVE mg/dL   Nitrite NEGATIVE NEGATIVE   Leukocytes, UA NEGATIVE NEGATIVE   RBC / HPF 0-5 0 - 5 RBC/hpf   WBC, UA 0-5 0 - 5 WBC/hpf   Bacteria, UA RARE (A) NONE SEEN   Squamous Epithelial / LPF 0-5 (A) NONE SEEN  Magnesium     Status: None   Collection Time: 02/24/17  6:51 PM  Result Value Ref Range   Magnesium 1.7 1.7 - 2.4 mg/dL  Phosphorus     Status: Abnormal   Collection Time: 02/24/17  6:51 PM  Result Value Ref Range   Phosphorus 12.6 (H) 2.5 - 4.6 mg/dL    Comment: RESULTS CONFIRMED BY MANUAL DILUTION  I-Stat CG4 Lactic Acid, ED     Status:  Abnormal   Collection Time: 02/24/17  6:56 PM  Result Value Ref Range   Lactic Acid, Venous 10.64 (HH) 0.5 - 1.9 mmol/L   Comment NOTIFIED PHYSICIAN   I-Stat Chem 8, ED     Status: Abnormal   Collection Time: 02/24/17  6:56 PM  Result Value Ref Range   Sodium 139 135 - 145 mmol/L   Potassium 5.5 (H) 3.5 - 5.1 mmol/L   Chloride 110 101 - 111 mmol/L   BUN 93 (H) 6 - 20 mg/dL   Creatinine, Ser 10.80 (H) 0.44 - 1.00 mg/dL   Glucose, Bld 164 (H) 65 - 99 mg/dL   Calcium, Ion 1.04 (L) 1.15 - 1.40 mmol/L   TCO2 6 0 - 100 mmol/L   Hemoglobin 9.5 (L) 12.0 - 15.0 g/dL   HCT 28.0 (L) 36.0 - 46.0 %  POC CBG, ED     Status: Abnormal   Collection Time: 02/24/17  7:11 PM  Result Value Ref Range   Glucose-Capillary 152 (H) 65 - 99 mg/dL  CK     Status: None   Collection Time: 02/24/17  7:16 PM  Result Value Ref Range   Total CK 62 38 - 234 U/L  POC CBG, ED     Status: Abnormal   Collection Time: 02/24/17  8:20 PM  Result Value Ref Range   Glucose-Capillary 176 (H) 65 - 99 mg/dL  I-Stat arterial blood gas, ED     Status: Abnormal   Collection Time: 02/24/17  8:28 PM  Result Value Ref Range   pH, Arterial 6.894 (LL) 7.350 - 7.450   pCO2 arterial <14.0 (LL) 32.0 - 48.0 mmHg   pO2, Arterial 72.0 (L) 83.0 - 108.0 mmHg   Bicarbonate 2.6 (L) 20.0 - 28.0 mmol/L   TCO2 <5 0 - 100 mmol/L   O2 Saturation 86.0 %   Acid-base deficit 28.0 (H) 0.0 - 2.0 mmol/L   Patient temperature 93.1 F    Collection site RADIAL, ALLEN'S TEST ACCEPTABLE    Drawn by Operator    Sample type ARTERIAL    Comment NOTIFIED PHYSICIAN   POC CBG, ED     Status:  Abnormal   Collection Time: 02/24/17  8:34 PM  Result Value Ref Range   Glucose-Capillary 158 (H) 65 - 99 mg/dL    Ct Abdomen Pelvis Wo Contrast  Result Date: 02/24/2017 CLINICAL DATA:  Epigastric pain EXAM: CT ABDOMEN AND PELVIS WITHOUT CONTRAST TECHNIQUE: Multidetector CT imaging of the abdomen and pelvis was performed following the standard protocol without  IV contrast. COMPARISON:  None. FINDINGS: Significant respiratory motion artifact compromises the study. Lower chest: Lung bases demonstrate no acute consolidation or pleural effusion. Borderline heart size. Minimal coronary artery calcification. Hepatobiliary: Calcified granuloma. No biliary dilatation. Status post cholecystectomy. Pancreas: Fuzzy appearance of the pancreas tail is suspected to be related to motion artifact. No ductal dilatation. Spleen: Normal in size without focal abnormality. Adrenals/Urinary Tract: Adrenal glands grossly within normal limits. No hydronephrosis. Bladder within normal limits Stomach/Bowel: Stomach nonenlarged. Small pill fragment. No dilated small bowel. No colon wall thickening. The appendix is slightly enlarged, measuring between 8 and 9 mm, no definitive surrounding inflammatory change. Vascular/Lymphatic: Aortic atherosclerosis. No enlarged abdominal or pelvic lymph nodes. Reproductive: Uterus and bilateral adnexa are unremarkable. Other: No free air or free fluid. Musculoskeletal: Posterior stabilization rods and fixating screws at L2-L3 with laminectomy changes from L2 through L4. Interbody disc devices at L2-L3, L3-L4 and L4-L5. IMPRESSION: 1. The examination is compromised by respiratory motion artifact. 2. The appendix is slightly enlarged at 8 to 9 mm but no surrounding inflammatory changes. Electronically Signed   By: Donavan Foil M.D.   On: 02/24/2017 18:15   Dg Chest 2 View  Result Date: 02/24/2017 CLINICAL DATA:  Chest pain, slurred speech and fatigue. EXAM: CHEST  2 VIEW COMPARISON:  12/21/2013 and prior radiographs FINDINGS: Cardiomegaly and pulmonary vascular congestion noted. Elevation of the right hemidiaphragm again identified. There is no evidence of focal airspace disease, pulmonary edema, suspicious pulmonary nodule/mass, pleural effusion, or pneumothorax. No acute bony abnormalities are identified. IMPRESSION: Cardiomegaly without evidence of acute  cardiopulmonary disease. Electronically Signed   By: Margarette Canada M.D.   On: 02/24/2017 16:27   Ct Head Wo Contrast  Result Date: 02/24/2017 CLINICAL DATA:  Left-sided weakness. EXAM: CT HEAD WITHOUT CONTRAST TECHNIQUE: Contiguous axial images were obtained from the base of the skull through the vertex without intravenous contrast. COMPARISON:  None. FINDINGS: Brain: There is no evidence of acute infarct, intracranial hemorrhage, mass, midline shift, or extra-axial fluid collection. The ventricles and sulci are normal. Incidental dural calcification along the sphenoid wings. Vascular: Calcified atherosclerosis at the skullbase. No hyperdense vessel. Skull: No fracture or focal osseous lesion. Sinuses/Orbits: Visualized paranasal sinuses and mastoid air cells are clear. Bilateral cataract extraction is noted. Other: None. IMPRESSION: Unremarkable CT appearance of the brain. Electronically Signed   By: Logan Bores M.D.   On: 02/24/2017 16:45    ROS Blood pressure (!) 80/46, pulse 72, temperature (!) 93.1 F (33.9 C), temperature source Rectal, resp. rate 18, SpO2 100 %. Physical Exam Physical Examination: General appearance - slow, but arouseable some speech much of which is unintelligible Mental status - disoriented , does not obey commands Eyes - pupils equal and reactive, extraocular eye movements intact, funduscopic exam normal, discs flat and sharp Mouth - mucous membranes moist, pharynx normal without lesions Neck - adenopathy noted PCL Lymphatics - posterior cervical nodes Chest - rales noted bibasilar, decreased air entry noted bilat Heart - S1 and S2 normal, systolic murmur SK8/7 at 2nd left intercostal space Abdomen - soft, liver down 4 cm , striae, no bs Extremities -  cool 1 + edema Skin - striae, moles, seb kerratoses  Assessment/Plan: 1 AKI most likely secondary to vol depletion in setting ACEI and NSAID, hemodymanic.  No evidence inflammation in urine.  Acidemic , hyperkalemic ,  severe solute overload and ^^phos(not exactly typical).  Need to lower acid, solute.  Will do CRRT 2 Sepsis ?? Cause vs just hemodyanmic etiology 3 Hypertension: not an issue 4. Anemia follow 5. DM 6 DDD 7 Cervical and lumbar dz 8 DJD 9 obesity P CRRT, lower solute , give bicarb, cultures, AB, fluids.  Support bp  Jami Bogdanski L 02/24/2017, 9:18 PM

## 2017-02-24 NOTE — Progress Notes (Addendum)
Pharmacy Antibiotic Note  [name] [name] is a 71 y.o. female admitted on 02/24/2017 with sepsis.  Pharmacy has been consulted for vancomycin and Zosyn dosing. Patient is hypothermic, WBC elevated at 27.8, lactic acid 10.79, SCr 10.48 (baseline ~1) with CrCl ~4 ml/min.  Renal consulted - planning on CRRT tonight - will adjust antibiotics for CRRT dosing.  Zosyn 3.375g IV (45min infusion) given at 1658 and vancomycin 2g IV x 1 given at 1740 in the ED.   Plan: Start vancomycin 1g (~10mg /kg) IV q24h Adjust Zosyn to 3.375g IV q6h Monitor clinical progress, c/s F/u de-escalation plan/LOT, vancomycin levels as indicated F/u Renal plans   Temp (24hrs), Avg:94.1 F (34.5 C), Min:93.1 F (33.9 C), Max:95 F (35 C)   Recent Labs Lab 02/24/17 1600 02/24/17 1609 02/24/17 1640 02/24/17 1856  WBC 27.8*  --   --   --   CREATININE 10.48*  --   --  10.80*  LATICACIDVEN  --  10.79* 10.7* 10.64*    Estimated Creatinine Clearance: 4.3 mL/min (A) (by C-G formula based on SCr of 10.8 mg/dL (H)).    No Known Allergies  Antimicrobials this admission: 5/16 vanc >>  5/16 Zosyn >>   Dose adjustments this admission: n/a  Microbiology results: 5/16 BCx: sent   Elicia Lamp, PharmD, BCPS Clinical Pharmacist 02/24/2017 9:32 PM

## 2017-02-24 NOTE — ED Notes (Signed)
Desan, Critical care at bedside

## 2017-02-24 NOTE — ED Notes (Signed)
Lactic acid result given to T. Woodrum

## 2017-02-24 NOTE — Plan of Care (Signed)
  Interdisciplinary Goals of Care Family Meeting   Date carried out:: 02/24/2017  Location of the meeting: Bedside  Member's involved: Physician, Bedside Registered Nurse and Family Member or next of kin  Durable Power of Attorney or acting medical decision maker: daughters [name] and Helene Kelp  Discussion: We discussed goals of care for [facility] .  She has apparently indicated in years past that she did not want life support. However in my discussion with the family this evening her daughters stated that she has indicated that she would want CPR and mechanical ventilation.  The patient actually spoke up during the conversation and said twice that she would want mechanical ventilation and CPR in this situation.  Code status: Full Code  Disposition: Continue current acute care  Time spent for the meeting: 20 minutes  Simonne Maffucci 02/24/2017, 10:17 PM

## 2017-02-24 NOTE — ED Provider Notes (Signed)
[facility] DEPT Provider Note   CSN: [id number] Arrival date & time: 02/24/17  1525     History   Chief Complaint Chief Complaint  Patient presents with  . Fatigue    HPI [name] [name] is a 71 y.o. female.  The history is provided by the patient and a relative.  Illness  Associated symptoms include abdominal pain. Pertinent negatives include no chest pain, no headaches and no shortness of breath.  Patient presents with family for concern of one week of worsening fatigue and has had 3 days of nonbloody nonbilious vomiting as well as nausea. Has been having nonbloody diarrhea for 3 days. This morning she woke up at 8 AM unable to ambulate at her baseline prompting family to call their doctor who advised them to come to the ER. Patient states that she started feeling weak in her left side last night. She denies any fevers or dysuria. Denies chest pain or shortness of breath. Does report chronic shoulder pain 2/2 cervical stenosis that she gets epidural injections for and was supposed to get one yesterday which was canceled due to the vomiting. No modifying factors that make this worse or better  Past Medical History:  Diagnosis Date  . Arthritis   . CAD 08/26/2006   Cath 07/05:multiple areas of nonobstructive disease = medical & RF mgmt, well preserved global systolic function. Dr. Lia Foyer. Nuclear med stress test 01/2014 : Normal stress nuclear study. LV Ejection Fraction: 76%. LV Wall Motion: NL LV Function; NL Wall Motion.      . Chronic pain syndrome 01/03/2013   Multifactorial. Non opioid requiring.   L2-5 fusion, with hardware at L2-3, stable per MRI 2010. Followed by neurosurgery (Dr. 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    [name] DEPRESSION 08/26/2006   On paxil    . DIABETES MELLITUS, TYPE II 08/26/2006   Insulin dependent. Victoza added 03/2014  . DYSLIPIDEMIA 11/01/2006   LDL goal < 100, 70 if can achieve  without side effects.     . Gallstones   . GERD 08/26/2006   Heartburn QHS.     [name] HYPERTENSION 08/26/2006   On BB, ACEI, lasix, norvasc, metolazone, and imdur.     . OBESITY 08/26/2006   BMI 39.5. Obesity Class 2.     . PVD (peripheral vascular disease) ([place]) 03/27/2014   2015 LE arterial Duplex - Possible mild inflow disease on the left. 0-49% bilateral SFA disease, without focal stenosis. Three vesel run-off, bilaterally. Medical tx.     Patient Active Problem List   Diagnosis Date Noted  . Acute renal failure (ARF) ([place]) 02/24/2017  . Acute respiratory failure with hypoxemia ([place])   . CKD stage 3 due to type 2 diabetes mellitus ([place]) 12/11/2016  . Diabetic neuropathy ([place]) 12/10/2016  . Small intestinal bacterial overgrowth 12/24/2015  . Goals of care, counseling/discussion 01/18/2015  . ASCUS with positive high risk HPV 08/03/2014  . PVD (peripheral vascular disease) ([place]) 03/27/2014  . Diastolic CHF, chronic ([place]) 12/21/2013  . Healthcare maintenance 04/25/2013  . Chronic pain syndrome 01/03/2013  . Insomnia 12/22/2011  . Vitamin B12 deficiency 12/27/2009  . Vitamin D deficiency 12/27/2009  . Anemia 12/27/2009  . Hypomagnesemia 12/29/2008  . Osteopenia 08/01/2008  . Hyperlipidemia 11/01/2006  . GLAUCOMA NOS 11/01/2006  . DM (diabetes mellitus), type 2, uncontrolled, periph vascular complic ([place]) 63/78/5885  . Obesity 08/26/2006  . Depression 08/26/2006  . HTN (hypertension) 08/26/2006  . Coronary  atherosclerosis 08/26/2006  . GERD 08/26/2006    Past Surgical History:  Procedure Laterality Date  . CHOLECYSTECTOMY     pt denies 04/07/16  . endometrial biospy    . LEFT HEART CATH    . lumbar fusion surgery  10/08   L3-L5  . posterior lumbar interfusion surgery  07/18/07   L2-L3  . rotator cuff surgery Bilateral     OB History    No data available       Home Medications    Prior to Admission medications   Medication Sig Start Date End Date Taking?  Authorizing Provider  Acetaminophen 650 MG TABS Take 650 mg by mouth 3 (three) times daily.    [provider]  amLODipine (NORVASC) 5 MG tablet Take 1 tablet (5 mg total) by mouth daily. 08/12/16   Dorothy Spark, MD  aspirin 81 MG tablet Take 1 tablet (81 mg total) by mouth daily. 04/25/13   Bartholomew Crews, MD  ASSURE COMFORT LANCETS 30G MISC E11.59. Use to check blood sugar three times a day 12/23/16   Bartholomew Crews, MD  atenolol (TENORMIN) 100 MG tablet Take 0.5 tablets (50 mg total) by mouth daily. 08/12/16   Dorothy Spark, MD  atorvastatin (LIPITOR) 10 MG tablet Take 1 tablet (10 mg total) by mouth daily. 08/12/16   Dorothy Spark, MD  benazepril (LOTENSIN) 40 MG tablet Take 1 tablet (40 mg total) by mouth daily. 08/12/16   Dorothy Spark, MD  calcium citrate-vitamin D 500-400 MG-UNIT chewable tablet Chew 1 tablet by mouth daily.    [provider]  diclofenac (VOLTAREN) 75 MG EC tablet Take 1 tablet (75 mg total) by mouth daily. 02/19/17   Thurman Coyer, DO  diclofenac sodium (VOLTAREN) 1 % GEL Apply 4 g topically 4 (four) times daily. Apply small amount to affected area up to 4 times a day. 11/07/15   Bartholomew Crews, MD  furosemide (LASIX) 40 MG tablet Take 1 tablet (40 mg total) by mouth daily. 08/12/16   Dorothy Spark, MD  glucose blood (ACCU-CHEK AVIVA PLUS) test strip E11.59. Use to check blood sugar three times a day 12/23/16   Bartholomew Crews, MD  Insulin Glargine (LANTUS SOLOSTAR) 100 UNIT/ML Solostar Pen Inject 8 Units into the skin daily at 10 pm. 12/10/16   Bartholomew Crews, MD  isosorbide mononitrate (IMDUR) 30 MG 24 hr tablet Take 1 tablet (30 mg total) by mouth daily. 08/12/16   Dorothy Spark, MD  lidocaine (XYLOCAINE) 5 % ointment Apply 2 grams up to 4 times daily to affected area 02/25/16   Thurman Coyer, DO  loratadine (CLARITIN) 10 MG tablet take 1 tablet by mouth once daily 01/29/15   Bartholomew Crews, MD    LYRICA 50 MG capsule  12/16/16   [provider]  magnesium chloride (SLOW-MAG) 64 MG TBEC SR tablet Take 1 tablet (64 mg total) by mouth daily. 12/30/15   Bartholomew Crews, MD  metFORMIN (GLUCOPHAGE) 1000 MG tablet take 1 tablet by mouth twice a day 07/13/16   Bartholomew Crews, MD  nitroGLYCERIN (NITROSTAT) 0.4 MG SL tablet Place 1 tablet (0.4 mg total) under the tongue every 5 (five) minutes as needed for chest pain. 08/12/16   Dorothy Spark, MD  NOVOFINE PLUS 32G X 4 MM MISC USE TO INJECT INSULIN 10/20/16   Bartholomew Crews, MD  nystatin (NYSTATIN) powder Apply topically 4 (four) times daily. 12/10/16  Bartholomew Crews, MD  PARoxetine (PAXIL) 40 MG tablet take 1 tablet by mouth every morning 06/02/16   Bartholomew Crews, MD  potassium chloride (K-DUR) 10 MEQ tablet Take 1 tablet (10 mEq total) by mouth daily. 08/12/16   Dorothy Spark, MD  pregabalin (LYRICA) 100 MG capsule Take 1 capsule (100 mg total) by mouth 3 (three) times daily. 12/10/16   Bartholomew Crews, MD  ranitidine (ZANTAC) 150 MG tablet Take 1 tablet (150 mg total) by mouth 2 (two) times daily. 05/29/15   Bartholomew Crews, MD  traMADol (ULTRAM) 50 MG tablet Take 1 tablet (50 mg total) by mouth every 12 (twelve) hours as needed. 08/26/16   Thurman Coyer, DO  VICTOZA 18 MG/3ML SOPN inject 1.2 milligram subcutaneously daily 09/08/16   Bartholomew Crews, MD  zolpidem (AMBIEN CR) 6.25 MG CR tablet Take 1 tablet (6.25 mg total) by mouth at bedtime as needed for sleep. 12/10/16   Bartholomew Crews, MD    Family History Family History  Problem Relation Age of Onset  . Cirrhosis Mother   . Diabetes Father   . Hypertension Father   . Hypertension Sister   . Diabetes Sister   . Hyperlipidemia Sister     Social History Social History  Substance Use Topics  . Smoking status: Never Smoker  . Smokeless tobacco: Never Used  . Alcohol use No     Allergies   Patient has no known  allergies.   Review of Systems Review of Systems  Constitutional: Negative for fever.  HENT: Negative for trouble swallowing.   Eyes: Negative.   Respiratory: Negative for cough and shortness of breath.   Cardiovascular: Negative for chest pain and leg swelling.  Gastrointestinal: Positive for abdominal pain, diarrhea, nausea and vomiting.  Genitourinary: Negative.   Musculoskeletal: Positive for gait problem.  Skin: Negative.   Neurological: Negative for headaches.  All other systems reviewed and are negative.    Physical Exam Updated Vital Signs ED Triage Vitals  Enc Vitals Group     BP 02/24/17 1545 (!) 100/51     Pulse Rate 02/24/17 1545 69     Resp 02/24/17 1545 20     Temp 02/24/17 1552 (!) 95 F (35 C)     Temp Source 02/24/17 1552 Rectal     SpO2 02/24/17 1525 98 %     Weight 02/24/17 2127 218 lb 7.6 oz (99.1 kg)     Height --      Head Circumference --      Peak Flow --      Pain Score --      Pain Loc --      Pain Edu? --      Excl. in [place]? --      Physical Exam  Constitutional: She appears well-nourished. No distress.  Elderly appearing female  HENT:  Head: Normocephalic and atraumatic.  Dry mucous membranes  Eyes: EOM are normal. Pupils are equal, round, and reactive to light.  Neck: Normal range of motion. Neck supple.  Cardiovascular: Normal rate, regular rhythm, normal heart sounds and intact distal pulses.   No murmur heard. Pulmonary/Chest: Effort normal and breath sounds normal. No respiratory distress.  Abdominal: Soft. She exhibits no distension. There is tenderness (epigastric). There is no guarding.  Musculoskeletal: Normal range of motion. She exhibits no edema, tenderness or deformity.  Neurological: She is disoriented (stated it is 2016 but she is at [facility]. could not state the president). No  cranial nerve deficit or sensory deficit. She exhibits normal muscle tone. GCS eye subscore is 4. GCS verbal subscore is 4. GCS motor subscore is  6.  Pt has decreased strength in left upper and lower ext compared to right. Unable to hold left extremities against gravity but only 3/5 strength on right sided extr   Skin: Skin is warm and dry. She is not diaphoretic. No erythema.  Nursing note and vitals reviewed.    ED Treatments / Results  Labs (all labs ordered are listed, but only abnormal results are displayed)   EKG   Radiology Ct Abdomen Pelvis Wo Contrast  Result Date: 02/24/2017 CLINICAL DATA:  Epigastric pain EXAM: CT ABDOMEN AND PELVIS WITHOUT CONTRAST TECHNIQUE: Multidetector CT imaging of the abdomen and pelvis was performed following the standard protocol without IV contrast. COMPARISON:  None. FINDINGS: Significant respiratory motion artifact compromises the study. Lower chest: Lung bases demonstrate no acute consolidation or pleural effusion. Borderline heart size. Minimal coronary artery calcification. Hepatobiliary: Calcified granuloma. No biliary dilatation. Status post cholecystectomy. Pancreas: Fuzzy appearance of the pancreas tail is suspected to be related to motion artifact. No ductal dilatation. Spleen: Normal in size without focal abnormality. Adrenals/Urinary Tract: Adrenal glands grossly within normal limits. No hydronephrosis. Bladder within normal limits Stomach/Bowel: Stomach nonenlarged. Small pill fragment. No dilated small bowel. No colon wall thickening. The appendix is slightly enlarged, measuring between 8 and 9 mm, no definitive surrounding inflammatory change. Vascular/Lymphatic: Aortic atherosclerosis. No enlarged abdominal or pelvic lymph nodes. Reproductive: Uterus and bilateral adnexa are unremarkable. Other: No free air or free fluid. Musculoskeletal: Posterior stabilization rods and fixating screws at L2-L3 with laminectomy changes from L2 through L4. Interbody disc devices at L2-L3, L3-L4 and L4-L5. IMPRESSION: 1. The examination is compromised by respiratory motion artifact. 2. The appendix is  slightly enlarged at 8 to 9 mm but no surrounding inflammatory changes. Electronically Signed   By: Donavan Foil M.D.   On: 02/24/2017 18:15   Dg Chest 2 View  Result Date: 02/24/2017 CLINICAL DATA:  Chest pain, slurred speech and fatigue. EXAM: CHEST  2 VIEW COMPARISON:  12/21/2013 and prior radiographs FINDINGS: Cardiomegaly and pulmonary vascular congestion noted. Elevation of the right hemidiaphragm again identified. There is no evidence of focal airspace disease, pulmonary edema, suspicious pulmonary nodule/mass, pleural effusion, or pneumothorax. No acute bony abnormalities are identified. IMPRESSION: Cardiomegaly without evidence of acute cardiopulmonary disease. Electronically Signed   By: Margarette Canada M.D.   On: 02/24/2017 16:27   Ct Head Wo Contrast  Result Date: 02/24/2017 CLINICAL DATA:  Left-sided weakness. EXAM: CT HEAD WITHOUT CONTRAST TECHNIQUE: Contiguous axial images were obtained from the base of the skull through the vertex without intravenous contrast. COMPARISON:  None. FINDINGS: Brain: There is no evidence of acute infarct, intracranial hemorrhage, mass, midline shift, or extra-axial fluid collection. The ventricles and sulci are normal. Incidental dural calcification along the sphenoid wings. Vascular: Calcified atherosclerosis at the skullbase. No hyperdense vessel. Skull: No fracture or focal osseous lesion. Sinuses/Orbits: Visualized paranasal sinuses and mastoid air cells are clear. Bilateral cataract extraction is noted. Other: None. IMPRESSION: Unremarkable CT appearance of the brain. Electronically Signed   By: Logan Bores M.D.   On: 02/24/2017 16:45   Dg Chest Port 1 View  Result Date: 02/24/2017 CLINICAL DATA:  ET tube, OG and line placement EXAM: PORTABLE CHEST 1 VIEW COMPARISON:  02/24/2017 FINDINGS: Insertion of right-sided central venous catheter, the tip projects over distal SVC. No right pneumothorax. Interval intubation, tip of  the endotracheal tube is 1.8 cm  superior to the carina. Esophageal tube tip is below the diaphragm. Possible ascending catheter with the tip positioned over the right atrial IVC junction. Stable cardiomegaly. Development of central vascular congestion and hazy asymmetric edema or infiltrate in the left chest. IMPRESSION: 1. Placement of support lines and tubes as above. Endotracheal tube tip approximately 1.8 cm superior to carina 2. Cardiomegaly. Development of central vascular congestion and asymmetric hazy opacity in the left thorax which may reflect asymmetric edema or infiltrate. Electronically Signed   By: Donavan Foil M.D.   On: 02/24/2017 23:38   Dg Abd Portable 1v  Result Date: 02/24/2017 CLINICAL DATA:  OG placement EXAM: PORTABLE ABDOMEN - 1 VIEW COMPARISON:  CT 02/24/2017 FINDINGS: Numerous support leads limit the exam. Esophageal tube tip overlies proximal stomach, side-port probably in the region of GE junction. Relatively gasless abdomen. Surgical hardware in the spine. IMPRESSION: Esophageal tube tip overlies proximal stomach, side-port probably at the GE junction, further advancement is suggested for more optimal positioning Electronically Signed   By: Donavan Foil M.D.   On: 02/24/2017 23:30    Procedures Procedures (including critical care time)  Medications Ordered in ED    Initial Impression / Assessment and Plan / ED Course  I have reviewed the triage vital signs and the nursing notes.  Pertinent labs & imaging results that were available during my care of the patient were reviewed by me and considered in my medical decision making (see chart for details).     Patient is a 71 year old female above past medical history presents with 1 week of fatigue as well as a few days of nausea vomiting diarrhea and today developed inability to ambulate at her baseline. Further history and exam as above with borderline low blood pressure as well as hyperthermia. Given possible asymmetrical weakness head CT obtained  without acute findings. Initial labs results with severe lactic acidosis as well as a leukocytosis. Chemistry came back with acute renal failure with hyperkalemia and EKG with prolonged QTC. Patient was temporized for her hyperkalemia and CT abdomen and pelvis without contrast done without acute surgical pathology. Lipase is elevated. At this time concern for acute renal failure in the setting of possible septic shock. She was given 30 mL per KG of IV fluids as well as broad-spectrum antibiotics. Blood cultures and urine studies obtained. During her stay she became progressively hypotensive as well as becoming bradycardic with a heart rate in the 30s requiring multiple doses of atropine. She got started on levophed and bicarbonate drip for a pH of 6.7. We thoroughly discussed all findings and clinical condition with family and patient. Patient has stated that she does not want to be hooked up to any machines and does not want to be intubated or undergo resuscitations. Family is in agreement with this plan at this time thus patient made DO NOT RESUSCITATE and DO NOT INTUBATE. During her stay her mental status progressively deteriorated. Nephrology was counseled that he will plan for CRRT upon admission to the ICU. ICU will admit the patient for further management and evaluation.  Final Clinical Impressions(s) / ED Diagnoses   Final diagnoses:  Acute renal failure (ARF) ([place])  AKI (acute kidney injury) ([place])  Acute renal failure with acute cortical necrosis ([place])  Nasogastric tube present  Endotracheal tube present    New Prescriptions Current Discharge Medication List       Heriberto Antigua, MD 02/25/17 2202    Gareth Morgan, MD 03/01/17  2255  

## 2017-02-24 NOTE — ED Notes (Signed)
MD at bedside. 

## 2017-02-24 NOTE — H&P (Signed)
PULMONARY / CRITICAL CARE MEDICINE   Name: [name] [name] MRN: [id number] DOB: 05/01/1946    ADMISSION DATE:  02/24/2017 CONSULTATION DATE:  02/24/17  REFERRING MD:  Billy Fischer  CHIEF COMPLAINT:  AMS  HISTORY OF PRESENT ILLNESS:  Pt is encephelopathic; therefore, this HPI is obtained from chart review. [name] [name] is a 71 y.o. female with PMH as outlined below. She was brought to [facility] ED 5/16 after daughter thought she was possible having a stroke due to slurred speech and inability to walk.  She also had been complaining of vague abdominal pain, 3 days of nonbloody nonbilious vomiting, 3 days of nonbloody diarrhea, and generalized weakness with worsening fatigue.  In ED, she was found to be altered and had multiple metabolic derangements including acute renal failure with SCr of 10.48, hyperkalemia with K 6, AGMA, lactate 10.79.  CT of the head, abdomen / pelvis were all normal.  Given her metabolic derangements and concern for deterioration with need for intubation, PCCM was asked to admit to ICU.  PAST MEDICAL HISTORY :  She  has a past medical history of Arthritis; CAD (08/26/2006); Chronic pain syndrome (01/03/2013); DEPRESSION (08/26/2006); DIABETES MELLITUS, TYPE II (08/26/2006); DYSLIPIDEMIA (11/01/2006); Gallstones; GERD (08/26/2006); HYPERTENSION (08/26/2006); OBESITY (08/26/2006); and PVD (peripheral vascular disease) ([place]) (03/27/2014).  PAST SURGICAL HISTORY: She  has a past surgical history that includes posterior lumbar interfusion surgery (07/18/07); lumbar fusion surgery (10/08); rotator cuff surgery (Bilateral); Cholecystectomy; endometrial biospy; and Left heart cath.  No Known Allergies  No current facility-administered medications on file prior to encounter.    Current Outpatient Prescriptions on File Prior to Encounter  Medication Sig  . Acetaminophen 650 MG TABS Take 650 mg by mouth 3 (three) times daily.  [name] amLODipine (NORVASC) 5 MG tablet Take 1 tablet (5 mg  total) by mouth daily.  [name] aspirin 81 MG tablet Take 1 tablet (81 mg total) by mouth daily.  . ASSURE COMFORT LANCETS 30G MISC E11.59. Use to check blood sugar three times a day  . atenolol (TENORMIN) 100 MG tablet Take 0.5 tablets (50 mg total) by mouth daily.  [name] atorvastatin (LIPITOR) 10 MG tablet Take 1 tablet (10 mg total) by mouth daily.  . benazepril (LOTENSIN) 40 MG tablet Take 1 tablet (40 mg total) by mouth daily.  . calcium citrate-vitamin D 500-400 MG-UNIT chewable tablet Chew 1 tablet by mouth daily.  . diclofenac (VOLTAREN) 75 MG EC tablet Take 1 tablet (75 mg total) by mouth daily.  . diclofenac sodium (VOLTAREN) 1 % GEL Apply 4 g topically 4 (four) times daily. Apply small amount to affected area up to 4 times a day.  . furosemide (LASIX) 40 MG tablet Take 1 tablet (40 mg total) by mouth daily.  [name] glucose blood (ACCU-CHEK AVIVA PLUS) test strip E11.59. Use to check blood sugar three times a day  . Insulin Glargine (LANTUS SOLOSTAR) 100 UNIT/ML Solostar Pen Inject 8 Units into the skin daily at 10 pm.  . isosorbide mononitrate (IMDUR) 30 MG 24 hr tablet Take 1 tablet (30 mg total) by mouth daily.  [name] lidocaine (XYLOCAINE) 5 % ointment Apply 2 grams up to 4 times daily to affected area  . loratadine (CLARITIN) 10 MG tablet take 1 tablet by mouth once daily  . LYRICA 50 MG capsule   . magnesium chloride (SLOW-MAG) 64 MG TBEC SR tablet Take 1 tablet (64 mg total) by mouth daily.  . metFORMIN (GLUCOPHAGE) 1000 MG tablet take 1 tablet by mouth twice a  day  . nitroGLYCERIN (NITROSTAT) 0.4 MG SL tablet Place 1 tablet (0.4 mg total) under the tongue every 5 (five) minutes as needed for chest pain.  [name] NOVOFINE PLUS 32G X 4 MM MISC USE TO INJECT INSULIN  . nystatin (NYSTATIN) powder Apply topically 4 (four) times daily.  [name] PARoxetine (PAXIL) 40 MG tablet take 1 tablet by mouth every morning  . potassium chloride (K-DUR) 10 MEQ tablet Take 1 tablet (10 mEq total) by mouth daily.  . pregabalin  (LYRICA) 100 MG capsule Take 1 capsule (100 mg total) by mouth 3 (three) times daily.  . ranitidine (ZANTAC) 150 MG tablet Take 1 tablet (150 mg total) by mouth 2 (two) times daily.  . traMADol (ULTRAM) 50 MG tablet Take 1 tablet (50 mg total) by mouth every 12 (twelve) hours as needed.  [name] VICTOZA 18 MG/3ML SOPN inject 1.2 milligram subcutaneously daily  . zolpidem (AMBIEN CR) 6.25 MG CR tablet Take 1 tablet (6.25 mg total) by mouth at bedtime as needed for sleep.    FAMILY HISTORY:  Her indicated that her mother is deceased. She indicated that her father is deceased. She indicated that only one of her five sisters is alive. She indicated that only one of her two brothers is alive. She indicated that her daughter is alive.    SOCIAL HISTORY: She  reports that she has never smoked. She has never used smokeless tobacco. She reports that she does not drink alcohol or use drugs.  REVIEW OF SYSTEMS:   Unable to obtain as pt is encephalopthic.  SUBJECTIVE:  Awake, able to tell me her name and where she is but is otherwise confused.  VITAL SIGNS: BP (!) 86/45   Pulse 71   Temp (!) 95 F (35 C) (Rectal)   Resp 18   SpO2 98%   HEMODYNAMICS:    VENTILATOR SETTINGS:    INTAKE / OUTPUT: No intake/output data recorded.   PHYSICAL EXAMINATION: General: Adult female, critically ill.  Has slurred speech. Neuro: Awake but confused.  No focal deficits noted. HEENT: [place]/AT. PERRL, sclerae anicteric. Cardiovascular: RRR, no M/R/G.  Lungs: Respirations even and unlabored.  CTA bilaterally, No W/R/R. Abdomen: BS x 4, soft, NT/ND.  Musculoskeletal: No gross deformities, no edema.  Skin: Intact, warm, no rashes.  LABS:  BMET  Recent Labs Lab 02/24/17 1600 02/24/17 1856  NA 140 139  K 6.0* 5.5*  CL 102 110  CO2 <7*  --   BUN 86* 93*  CREATININE 10.48* 10.80*  GLUCOSE 84 164*    Electrolytes  Recent Labs Lab 02/24/17 1600 02/24/17 1851  CALCIUM 8.2*  --   MG  --  1.7   PHOS  --  12.6*    CBC  Recent Labs Lab 02/24/17 1600 02/24/17 1856  WBC 27.8*  --   HGB 10.2* 9.5*  HCT 31.2* 28.0*  PLT 419*  --     Coag's  Recent Labs Lab 02/24/17 1600  INR 1.36    Sepsis Markers  Recent Labs Lab 02/24/17 1609 02/24/17 1640 02/24/17 1856  LATICACIDVEN 10.79* 10.7* 10.64*    ABG No results for input(s): PHART, PCO2ART, PO2ART in the last 168 hours.  Liver Enzymes  Recent Labs Lab 02/24/17 1600  AST 26  ALT 14  ALKPHOS 80  BILITOT 0.5  ALBUMIN 3.0*    Cardiac Enzymes  Recent Labs Lab 02/24/17 1600  TROPONINI <0.03    Glucose  Recent Labs Lab 02/24/17 1534 02/24/17 1816 02/24/17 1911  GLUCAP  82 187* 152*    Imaging Ct Abdomen Pelvis Wo Contrast  Result Date: 02/24/2017 CLINICAL DATA:  Epigastric pain EXAM: CT ABDOMEN AND PELVIS WITHOUT CONTRAST TECHNIQUE: Multidetector CT imaging of the abdomen and pelvis was performed following the standard protocol without IV contrast. COMPARISON:  None. FINDINGS: Significant respiratory motion artifact compromises the study. Lower chest: Lung bases demonstrate no acute consolidation or pleural effusion. Borderline heart size. Minimal coronary artery calcification. Hepatobiliary: Calcified granuloma. No biliary dilatation. Status post cholecystectomy. Pancreas: Fuzzy appearance of the pancreas tail is suspected to be related to motion artifact. No ductal dilatation. Spleen: Normal in size without focal abnormality. Adrenals/Urinary Tract: Adrenal glands grossly within normal limits. No hydronephrosis. Bladder within normal limits Stomach/Bowel: Stomach nonenlarged. Small pill fragment. No dilated small bowel. No colon wall thickening. The appendix is slightly enlarged, measuring between 8 and 9 mm, no definitive surrounding inflammatory change. Vascular/Lymphatic: Aortic atherosclerosis. No enlarged abdominal or pelvic lymph nodes. Reproductive: Uterus and bilateral adnexa are unremarkable.  Other: No free air or free fluid. Musculoskeletal: Posterior stabilization rods and fixating screws at L2-L3 with laminectomy changes from L2 through L4. Interbody disc devices at L2-L3, L3-L4 and L4-L5. IMPRESSION: 1. The examination is compromised by respiratory motion artifact. 2. The appendix is slightly enlarged at 8 to 9 mm but no surrounding inflammatory changes. Electronically Signed   By: Donavan Foil M.D.   On: 02/24/2017 18:15   Dg Chest 2 View  Result Date: 02/24/2017 CLINICAL DATA:  Chest pain, slurred speech and fatigue. EXAM: CHEST  2 VIEW COMPARISON:  12/21/2013 and prior radiographs FINDINGS: Cardiomegaly and pulmonary vascular congestion noted. Elevation of the right hemidiaphragm again identified. There is no evidence of focal airspace disease, pulmonary edema, suspicious pulmonary nodule/mass, pleural effusion, or pneumothorax. No acute bony abnormalities are identified. IMPRESSION: Cardiomegaly without evidence of acute cardiopulmonary disease. Electronically Signed   By: Margarette Canada M.D.   On: 02/24/2017 16:27   Ct Head Wo Contrast  Result Date: 02/24/2017 CLINICAL DATA:  Left-sided weakness. EXAM: CT HEAD WITHOUT CONTRAST TECHNIQUE: Contiguous axial images were obtained from the base of the skull through the vertex without intravenous contrast. COMPARISON:  None. FINDINGS: Brain: There is no evidence of acute infarct, intracranial hemorrhage, mass, midline shift, or extra-axial fluid collection. The ventricles and sulci are normal. Incidental dural calcification along the sphenoid wings. Vascular: Calcified atherosclerosis at the skullbase. No hyperdense vessel. Skull: No fracture or focal osseous lesion. Sinuses/Orbits: Visualized paranasal sinuses and mastoid air cells are clear. Bilateral cataract extraction is noted. Other: None. IMPRESSION: Unremarkable CT appearance of the brain. Electronically Signed   By: Logan Bores M.D.   On: 02/24/2017 16:45     STUDIES:  CT head 5/16  > negative. CT A / P 5/16 > appendix slightly enlarged but no surrounding inflammatory changes. CXR 5/16 > cardiomegaly. Echo 5/17 >  Renal US 5/16 >   CULTURES: Blood 5/16 >  Sputum 5/16 >  Urine 5/16 >   ANTIBIOTICS: Vanc 5/16 >  Zosyn 5/16 >   SIGNIFICANT EVENTS: 5/16 > admit.  LINES/TUBES: HD cath pending 5/16 >   DISCUSSION: 71 y.o. female admitted 5/16 with acute renal failure, dehydration, and concern for sepsis.  Given multiple metabolic derangements, PCCM asked to admit to ICU. Nephrology consulted by EDP and planning for CRRT tonight.  ASSESSMENT / PLAN:  CARDIOVASCULAR A:  Concern for sepsis of unclear etiology.  A repeat sepsis assessment has been performed. Hx HTN, HLD, CAD. DNR Status. P:  Continue aggressive IVF hydration. Continue levophed as needed to maintain goal MAP > 65. Trend troponin, lactate. Assess echo. Hold preadmission aspirin, atenolol, atorvastatin, benazepril, furosemide, imdur, nitro.  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  Also exacerbated by ACEi + NSAIDs. Hyperkalemia. AGMA - lactate + uremia + metformin. P:   Continue bicarb for now but consider early d/c. Nephrology consulted by EDP, planning for CRRT tonight. Assess renal US. Trend lacate. Correct electrolytes as indicated. BMP q12hrs.   INFECTIOUS A:   Concern for sepsis of unclear etiology.  A repeat sepsis assessment has been performed. P:   Abx as above (vanc / zosyn).  Follow cultures as above. PCT algorithm to limit abx exposure.  PULMONARY A: At risk for intubation if mental status declines. P:   Monitor clinically.  GASTROINTESTINAL A:   Nutrition. Nausea with vomiting - CT A / P negative. P:   NPO. Zofran PRN.  HEMATOLOGIC A:   VTE Prophylaxis. P:  SCD's / heparin. CBC in AM.  ENDOCRINE A:   DM. P:   SSI. Assess Hgb A1c. Hold preadmission metformin, victoza,  lantus.  NEUROLOGIC A:   Acute encephalopathy - ? Due to sepsis vs uremia vs metabolic vs CVA (though CVA less likely given non-focal exam, initial head CT neg). P:   Continue supportive care as outlined above. Avoid sedating meds. Consider repeat CT vs MRI if no improvement. Hold preadmission lyrica, paroxetine, zolpidem.  Family updated: grand daughter updated at bedside.  Interdisciplinary Family Meeting v Palliative Care Meeting:  Due by: 03/02/17  CC time: 30 min.   Montey Hora, [facility] Pager: [phone]  or [phone] 02/24/2017, 7:43 PM

## 2017-02-25 ENCOUNTER — Inpatient Hospital Stay (HOSPITAL_COMMUNITY): Payer: Medicare Other

## 2017-02-25 DIAGNOSIS — Z9289 Personal history of other medical treatment: Secondary | ICD-10-CM

## 2017-02-25 DIAGNOSIS — Z978 Presence of other specified devices: Secondary | ICD-10-CM

## 2017-02-25 DIAGNOSIS — N170 Acute kidney failure with tubular necrosis: Secondary | ICD-10-CM

## 2017-02-25 DIAGNOSIS — N171 Acute kidney failure with acute cortical necrosis: Secondary | ICD-10-CM

## 2017-02-25 LAB — LACTIC ACID, PLASMA
Lactic Acid, Venous: 10.1 mmol/L (ref 0.5–1.9)
Lactic Acid, Venous: 8.1 mmol/L (ref 0.5–1.9)
Lactic Acid, Venous: 9 mmol/L (ref 0.5–1.9)

## 2017-02-25 LAB — BASIC METABOLIC PANEL
Anion gap: 24 — ABNORMAL HIGH (ref 5–15)
BUN: 40 mg/dL — ABNORMAL HIGH (ref 6–20)
CO2: 19 mmol/L — ABNORMAL LOW (ref 22–32)
Calcium: 6.9 mg/dL — ABNORMAL LOW (ref 8.9–10.3)
Chloride: 97 mmol/L — ABNORMAL LOW (ref 101–111)
Creatinine, Ser: 4.26 mg/dL — ABNORMAL HIGH (ref 0.44–1.00)
GFR calc Af Amer: 11 mL/min — ABNORMAL LOW (ref >60)
GFR calc non Af Amer: 10 mL/min — ABNORMAL LOW (ref >60)
Glucose, Bld: 130 mg/dL — ABNORMAL HIGH (ref 65–99)
Potassium: 3.3 mmol/L — ABNORMAL LOW (ref 3.5–5.1)
Sodium: 140 mmol/L (ref 135–145)

## 2017-02-25 LAB — COMPREHENSIVE METABOLIC PANEL
ALT: 69 U/L — ABNORMAL HIGH (ref 14–54)
AST: 140 U/L — ABNORMAL HIGH (ref 15–41)
Albumin: 2 g/dL — ABNORMAL LOW (ref 3.5–5.0)
Alkaline Phosphatase: 64 U/L (ref 38–126)
Anion gap: 27 — ABNORMAL HIGH (ref 5–15)
BUN: 73 mg/dL — ABNORMAL HIGH (ref 6–20)
CO2: 7 mmol/L — ABNORMAL LOW (ref 22–32)
Calcium: 7.3 mg/dL — ABNORMAL LOW (ref 8.9–10.3)
Chloride: 110 mmol/L (ref 101–111)
Creatinine, Ser: 7.94 mg/dL — ABNORMAL HIGH (ref 0.44–1.00)
GFR calc Af Amer: 5 mL/min — ABNORMAL LOW (ref >60)
GFR calc non Af Amer: 5 mL/min — ABNORMAL LOW (ref >60)
Glucose, Bld: 208 mg/dL — ABNORMAL HIGH (ref 65–99)
Potassium: 4.4 mmol/L (ref 3.5–5.1)
Sodium: 144 mmol/L (ref 135–145)
Total Bilirubin: 0.9 mg/dL (ref 0.3–1.2)
Total Protein: 5.4 g/dL — ABNORMAL LOW (ref 6.5–8.1)

## 2017-02-25 LAB — POCT I-STAT 3, ART BLOOD GAS (G3+)
Acid-base deficit: <30 mmol/L — ABNORMAL HIGH (ref 0.0–2.0)
Acid-base deficit: 4 mmol/L — ABNORMAL HIGH (ref 0.0–2.0)
Acid-base deficit: 2 mmol/L (ref 0.0–2.0)
Bicarbonate: 2.3 mmol/L — ABNORMAL LOW (ref 20.0–24.0)
Bicarbonate: 18.5 mmol/L — ABNORMAL LOW (ref 20.0–28.0)
Bicarbonate: 21 mmol/L (ref 20.0–28.0)
O2 Saturation: 62 % — ABNORMAL LOW (ref 90.0–100.0)
O2 Saturation: 99 %
O2 Saturation: 98 %
Patient temperature: 97.4
Patient temperature: 97.7
TCO2: <5 mmol/L (ref 0–100)
TCO2: 19 mmol/L (ref 0–100)
TCO2: 22 mmol/L (ref 0–100)
pCO2 arterial: 17.7 mmHg — ABNORMAL LOW (ref 35.0–45.0)
pCO2 arterial: 23.2 mmHg — ABNORMAL LOW (ref 32.0–48.0)
pCO2 arterial: 28.2 mmHg — ABNORMAL LOW (ref 32.0–48.0)
pH, Arterial: 6.726 — ABNORMAL LOW (ref 7.350–7.400)
pH, Arterial: 7.506 — ABNORMAL HIGH (ref 7.350–7.450)
pH, Arterial: 7.478 — ABNORMAL HIGH (ref 7.350–7.450)
pO2, Arterial: 63 mmHg — ABNORMAL LOW (ref 83.0–108.0)
pO2, Arterial: 139 mmHg — ABNORMAL HIGH (ref 83.0–108.0)
pO2, Arterial: 92 mmHg (ref 83.0–108.0)

## 2017-02-25 LAB — RENAL FUNCTION PANEL
Albumin: 2.6 g/dL — ABNORMAL LOW (ref 3.5–5.0)
Albumin: 2.3 g/dL — ABNORMAL LOW (ref 3.5–5.0)
Anion gap: 27 — ABNORMAL HIGH (ref 5–15)
Anion gap: 17 — ABNORMAL HIGH (ref 5–15)
BUN: 55 mg/dL — ABNORMAL HIGH (ref 6–20)
BUN: 28 mg/dL — ABNORMAL HIGH (ref 6–20)
CO2: 13 mmol/L — ABNORMAL LOW (ref 22–32)
CO2: 24 mmol/L (ref 22–32)
Calcium: 7.2 mg/dL — ABNORMAL LOW (ref 8.9–10.3)
Calcium: 6.9 mg/dL — ABNORMAL LOW (ref 8.9–10.3)
Chloride: 100 mmol/L — ABNORMAL LOW (ref 101–111)
Chloride: 98 mmol/L — ABNORMAL LOW (ref 101–111)
Creatinine, Ser: 5.97 mg/dL — ABNORMAL HIGH (ref 0.44–1.00)
Creatinine, Ser: 3.08 mg/dL — ABNORMAL HIGH (ref 0.44–1.00)
GFR calc Af Amer: 7 mL/min — ABNORMAL LOW
GFR calc Af Amer: 16 mL/min — ABNORMAL LOW
GFR calc non Af Amer: 6 mL/min — ABNORMAL LOW
GFR calc non Af Amer: 14 mL/min — ABNORMAL LOW
Glucose, Bld: 260 mg/dL — ABNORMAL HIGH (ref 65–99)
Glucose, Bld: 115 mg/dL — ABNORMAL HIGH (ref 65–99)
Phosphorus: 7.5 mg/dL — ABNORMAL HIGH (ref 2.5–4.6)
Phosphorus: 2.4 mg/dL — ABNORMAL LOW (ref 2.5–4.6)
Potassium: 4.2 mmol/L (ref 3.5–5.1)
Potassium: 3.8 mmol/L (ref 3.5–5.1)
Sodium: 140 mmol/L (ref 135–145)
Sodium: 139 mmol/L (ref 135–145)

## 2017-02-25 LAB — POCT ACTIVATED CLOTTING TIME
Activated Clotting Time: 164 seconds
Activated Clotting Time: 175 seconds
Activated Clotting Time: 197 seconds
Activated Clotting Time: 202 seconds
Activated Clotting Time: 213 seconds
Activated Clotting Time: 202 seconds
Activated Clotting Time: 219 seconds
Activated Clotting Time: 219 seconds
Activated Clotting Time: 219 seconds
Activated Clotting Time: 219 seconds
Activated Clotting Time: 219 seconds
Activated Clotting Time: 235 seconds
Activated Clotting Time: 230 seconds
Activated Clotting Time: 224 seconds
Activated Clotting Time: 219 seconds
Activated Clotting Time: 191 seconds
Activated Clotting Time: 175 seconds
Activated Clotting Time: 208 seconds
Activated Clotting Time: 208 seconds
Activated Clotting Time: 213 seconds
Activated Clotting Time: 219 seconds

## 2017-02-25 LAB — POCT I-STAT EG7
Acid-Base Excess: 4 mmol/L — ABNORMAL HIGH (ref 0.0–2.0)
Bicarbonate: 29.2 mmol/L — ABNORMAL HIGH (ref 20.0–28.0)
Calcium, Ion: 0.98 mmol/L — ABNORMAL LOW (ref 1.15–1.40)
HCT: 24 % — ABNORMAL LOW (ref 36.0–46.0)
Hemoglobin: 8.2 g/dL — ABNORMAL LOW (ref 12.0–15.0)
O2 Saturation: 65 %
Patient temperature: 98.9
Potassium: 3.7 mmol/L (ref 3.5–5.1)
Sodium: 140 mmol/L (ref 135–145)
TCO2: 31 mmol/L (ref 0–100)
pCO2, Ven: 44.8 mmHg (ref 44.0–60.0)
pH, Ven: 7.423 (ref 7.250–7.430)
pO2, Ven: 34 mmHg (ref 32.0–45.0)

## 2017-02-25 LAB — BLOOD CULTURE ID PANEL (REFLEXED)

## 2017-02-25 LAB — IRON AND TIBC
Iron: 43 ug/dL (ref 28–170)
Saturation Ratios: 27 % (ref 10.4–31.8)
TIBC: 158 ug/dL — ABNORMAL LOW (ref 250–450)
UIBC: 115 ug/dL

## 2017-02-25 LAB — APTT: aPTT: 160 s — ABNORMAL HIGH (ref 24–36)

## 2017-02-25 LAB — BLOOD GAS, ARTERIAL
Acid-base deficit: 19.7 mmol/L — ABNORMAL HIGH (ref 0.0–2.0)
Bicarbonate: 7.7 mmol/L — ABNORMAL LOW (ref 20.0–28.0)
Drawn by: 404151
FIO2: 60
MECHVT: 460 mL
O2 Saturation: 94.8 %
PEEP: 5 cmH2O
Patient temperature: 91.7
RATE: 35 resp/min
pCO2 arterial: 19.8 mmHg — CL (ref 32.0–48.0)
pH, Arterial: 7.183 — CL (ref 7.350–7.450)
pO2, Arterial: 76.1 mmHg — ABNORMAL LOW (ref 83.0–108.0)

## 2017-02-25 LAB — PROCALCITONIN
Procalcitonin: 1.41 ng/mL
Procalcitonin: 1.41 ng/mL

## 2017-02-25 LAB — CBC
HCT: 26.9 % — ABNORMAL LOW (ref 36.0–46.0)
Hemoglobin: 9.3 g/dL — ABNORMAL LOW (ref 12.0–15.0)
MCH: 28.4 pg (ref 26.0–34.0)
MCHC: 34.6 g/dL (ref 30.0–36.0)
MCV: 82.3 fL (ref 78.0–100.0)
Platelets: 342 10*3/uL (ref 150–400)
RBC: 3.27 MIL/uL — ABNORMAL LOW (ref 3.87–5.11)
RDW: 15.3 % (ref 11.5–15.5)
WBC: 30.3 10*3/uL — ABNORMAL HIGH (ref 4.0–10.5)

## 2017-02-25 LAB — GLUCOSE, CAPILLARY
Glucose-Capillary: 155 mg/dL — ABNORMAL HIGH (ref 65–99)
Glucose-Capillary: 182 mg/dL — ABNORMAL HIGH (ref 65–99)
Glucose-Capillary: 204 mg/dL — ABNORMAL HIGH (ref 65–99)
Glucose-Capillary: 246 mg/dL — ABNORMAL HIGH (ref 65–99)
Glucose-Capillary: 88 mg/dL (ref 65–99)
Glucose-Capillary: 98 mg/dL (ref 65–99)

## 2017-02-25 LAB — URIC ACID: Uric Acid, Serum: 10 mg/dL — ABNORMAL HIGH (ref 2.3–6.6)

## 2017-02-25 LAB — CREATININE, URINE, RANDOM: Creatinine, Urine: 51.36 mg/dL

## 2017-02-25 LAB — MRSA PCR SCREENING

## 2017-02-25 LAB — TSH: TSH: 0.601 u[IU]/mL (ref 0.350–4.500)

## 2017-02-25 LAB — TROPONIN I
Troponin I: 0.03 ng/mL (ref <0.03)
Troponin I: 0.18 ng/mL (ref <0.03)
Troponin I: 0.56 ng/mL (ref <0.03)
Troponin I: 0.86 ng/mL (ref <0.03)
Troponin I: 0.81 ng/mL (ref <0.03)

## 2017-02-25 LAB — MAGNESIUM
Magnesium: 1.4 mg/dL — ABNORMAL LOW (ref 1.7–2.4)
Magnesium: 2.1 mg/dL (ref 1.7–2.4)

## 2017-02-25 LAB — PHOSPHORUS
Phosphorus: 10.5 mg/dL — ABNORMAL HIGH (ref 2.5–4.6)
Phosphorus: 3.8 mg/dL (ref 2.5–4.6)
Phosphorus: 2.3 mg/dL — ABNORMAL LOW (ref 2.5–4.6)

## 2017-02-25 LAB — SALICYLATE LEVEL: Salicylate Lvl: <7 mg/dL (ref 2.8–30.0)

## 2017-02-25 LAB — VITAMIN B12: Vitamin B-12: 2326 pg/mL — ABNORMAL HIGH (ref 180–914)

## 2017-02-25 LAB — SODIUM, URINE, RANDOM: Sodium, Ur: 84 mmol/L

## 2017-02-25 LAB — LACTATE DEHYDROGENASE: LDH: 350 U/L — ABNORMAL HIGH (ref 98–192)

## 2017-02-25 MED ORDER — FENTANYL CITRATE (PF) 100 MCG/2ML IJ SOLN
50.0000 ug | INTRAMUSCULAR | Status: DC | PRN
  Filled 2017-02-25: qty 2

## 2017-02-25 MED ORDER — MIDAZOLAM HCL 2 MG/2ML IJ SOLN
1.0000 mg | INTRAMUSCULAR | Status: DC | PRN
  Administered 2017-02-26: 1 mg via INTRAVENOUS
  Filled 2017-02-25: qty 2

## 2017-02-25 MED ORDER — FENTANYL BOLUS VIA INFUSION
25.0000 ug | INTRAVENOUS | Status: DC | PRN
  Administered 2017-02-25 – 2017-02-26 (×3): 25 ug via INTRAVENOUS
  Filled 2017-02-25: qty 25

## 2017-02-25 MED ORDER — STERILE WATER FOR INJECTION IV SOLN
INTRAVENOUS | Status: DC
  Administered 2017-02-25 (×3): via INTRAVENOUS_CENTRAL
  Filled 2017-02-25 (×5): qty 300

## 2017-02-25 MED ORDER — POTASSIUM CHLORIDE 10 MEQ/50ML IV SOLN
10.0000 meq | INTRAVENOUS | Status: AC
  Administered 2017-02-25 (×4): 10 meq via INTRAVENOUS
  Filled 2017-02-25 (×4): qty 50

## 2017-02-25 MED ORDER — CHLORHEXIDINE GLUCONATE 0.12% ORAL RINSE (MEDLINE KIT)
15.0000 mL | Freq: Two times a day (BID) | OROMUCOSAL | Status: DC
  Administered 2017-02-25 – 2017-02-27 (×6): 15 mL via OROMUCOSAL

## 2017-02-25 MED ORDER — FENTANYL CITRATE (PF) 100 MCG/2ML IJ SOLN: 50.0000 ug | Freq: Once | INTRAMUSCULAR | Status: DC

## 2017-02-25 MED ORDER — FENTANYL 2500MCG IN NS 250ML (10MCG/ML) PREMIX INFUSION
25.0000 ug/h | INTRAVENOUS | Status: DC
  Administered 2017-02-25: 100 ug/h via INTRAVENOUS
  Administered 2017-02-26: 150 ug/h via INTRAVENOUS
  Administered 2017-02-27: 200 ug/h via INTRAVENOUS
  Filled 2017-02-25 (×3): qty 250

## 2017-02-25 MED ORDER — PANTOPRAZOLE SODIUM 40 MG IV SOLR
40.0000 mg | Freq: Every day | INTRAVENOUS | Status: DC
  Administered 2017-02-25 – 2017-02-28 (×4): 40 mg via INTRAVENOUS
  Filled 2017-02-25 (×4): qty 40

## 2017-02-25 MED ORDER — MIDAZOLAM HCL 2 MG/2ML IJ SOLN
1.0000 mg | INTRAMUSCULAR | Status: DC | PRN
  Filled 2017-02-25: qty 2

## 2017-02-25 MED ORDER — PRO-STAT SUGAR FREE PO LIQD
30.0000 mL | Freq: Two times a day (BID) | ORAL | Status: DC
  Administered 2017-02-25: 30 mL
  Filled 2017-02-25: qty 30

## 2017-02-25 MED ORDER — PRISMASOL BGK 4/2.5 32-4-2.5 MEQ/L IV SOLN
INTRAVENOUS | Status: DC
  Administered 2017-02-26 (×2): via INTRAVENOUS_CENTRAL
  Filled 2017-02-25 (×4): qty 5000

## 2017-02-25 MED ORDER — METOPROLOL TARTRATE 5 MG/5ML IV SOLN
2.5000 mg | Freq: Four times a day (QID) | INTRAVENOUS | Status: DC
  Administered 2017-02-26 – 2017-02-28 (×8): 2.5 mg via INTRAVENOUS
  Filled 2017-02-25 (×10): qty 5

## 2017-02-25 MED ORDER — VITAL HIGH PROTEIN PO LIQD
1000.0000 mL | ORAL | Status: DC
  Administered 2017-02-25: 1000 mL

## 2017-02-25 MED ORDER — ORAL CARE MOUTH RINSE
15.0000 mL | Freq: Four times a day (QID) | OROMUCOSAL | Status: DC
  Administered 2017-02-25 – 2017-02-28 (×13): 15 mL via OROMUCOSAL

## 2017-02-25 MED ORDER — ASPIRIN 81 MG PO CHEW
81.0000 mg | CHEWABLE_TABLET | Freq: Every day | ORAL | Status: DC
  Administered 2017-02-25 – 2017-03-04 (×8): 81 mg
  Filled 2017-02-25 (×8): qty 1

## 2017-02-25 MED ORDER — FENTANYL CITRATE (PF) 100 MCG/2ML IJ SOLN
50.0000 ug | INTRAMUSCULAR | Status: AC | PRN
  Administered 2017-02-25 (×3): 50 ug via INTRAVENOUS
  Filled 2017-02-25 (×2): qty 2

## 2017-02-25 MED ORDER — MAGNESIUM SULFATE 2 GM/50ML IV SOLN
2.0000 g | Freq: Once | INTRAVENOUS | Status: AC
  Administered 2017-02-25: 2 g via INTRAVENOUS
  Filled 2017-02-25: qty 50

## 2017-02-25 MED ORDER — VITAL HIGH PROTEIN PO LIQD
1000.0000 mL | ORAL | Status: DC
  Administered 2017-02-25 – 2017-02-27 (×3): 1000 mL
  Administered 2017-02-27: 04:00:00
  Administered 2017-02-27: 1000 mL

## 2017-02-25 NOTE — Progress Notes (Signed)
CRITICAL VALUE ALERT  Critical value received:  Lactic Acid 10.1   Troponin   0.03  Date of notification:  02/25/17  Time of notification:  0737  Critical value read back:Yes.    Nurse who received alert:  Donald Siva RN  MD notified (1st page):  Dr. Alva Garnet  Time of first page:  [phone]  MD notified (2nd page):  Time of second page:  Responding MD:  Dr. Alva Garnet   Time MD responded:  Dr. Alva Garnet

## 2017-02-25 NOTE — Progress Notes (Signed)
Inpatient Diabetes Program Recommendations  AACE/ADA: New Consensus Statement on Inpatient Glycemic Control (2015)  Target Ranges:  Prepandial:   less than 140 mg/dL      Peak postprandial:   less than 180 mg/dL (1-2 hours)      Critically ill patients:  140 - 180 mg/dL   Lab Results  Component Value Date   GLUCAP 182 (H) 02/25/2017   HGBA1C 7.0 12/10/2016    Review of Glycemic Control:  Results for [name] [name], [name] [name] (MRN [id number]) as of 02/25/2017 10:18  Ref. Range 02/24/2017 20:34 02/24/2017 21:44 02/25/2017 00:17 02/25/2017 03:25 02/25/2017 08:42  Glucose-Capillary Latest Ref Range: 65 - 99 mg/dL 158 (H) 166 (H) 204 (H) 246 (H) 182 (H)   Diabetes history: Type 2 diabetes Outpatient Diabetes medications: Lantus 12 units q HS, Metformin 1000 mg bid, Victoza 1.2 mg daily Current orders for Inpatient glycemic control:  Novolog moderate q 4 hours  Inpatient Diabetes Program Recommendations:    Please consider ICU glycemic control order set.  Also note that per medication reconciliation, patient was on Metformin prior to admit.  Could this have affect on lactic acid levels?  Thanks, Adah Perl, RN, BC-ADM Inpatient Diabetes Coordinator Pager [phone] (8a-5p)

## 2017-02-25 NOTE — Progress Notes (Signed)
PULMONARY / CRITICAL CARE MEDICINE   Name: [name] [name] MRN: [id number] DOB: [birth year]    ADMISSION DATE:  02/24/2017 CONSULTATION DATE:  02/24/17  REFERRING MD:  Billy Fischer  CHIEF COMPLAINT:  AMS  HISTORY OF PRESENT ILLNESS:  Pt is encephelopathic; therefore, this HPI is obtained from chart review. [name] [name] is a 71 y.o. female with PMH as outlined below. She was brought to [facility] ED 5/16 after daughter thought she was possible having a stroke due to slurred speech and inability to walk.  She also had been complaining of vague abdominal pain, 3 days of nonbloody nonbilious vomiting, 3 days of nonbloody diarrhea, and generalized weakness with worsening fatigue. In ED, she was found to be altered and had multiple metabolic derangements including acute renal failure with SCr of 10.48, hyperkalemia with K 6, AGMA, lactate 10.79.  CT of the head, abdomen / pelvis were all normal. Given her metabolic derangements and concern for deterioration with need for intubation, PCCM was asked to admit to ICU.  SUBJECTIVE/INTERVAL HISTORY: Started CRRT overnight, pressors weaning. C/o chest pain on WUA  VITAL SIGNS: BP 122/60   Pulse 75   Temp (!) 92.2 F (33.4 C) (Rectal) Comment: Mateo Flow, RN notified  Resp (!) 31   Wt 101.8 kg (224 lb 6.9 oz)   SpO2 98%   BMI 60.67 kg/m   HEMODYNAMICS:    VENTILATOR SETTINGS: Vent Mode: PRVC FiO2 (%):  [50 %-60 %] 60 % Set Rate:  [35 bmp] 35 bmp Vt Set:  [460 mL] 460 mL PEEP:  [5 cmH20] 5 cmH20 Plateau Pressure:  [24 cmH20-26 cmH20] 26 cmH20  INTAKE / OUTPUT: I/O last 3 completed shifts: In: 2625 [I.V.:2355; IV Piggyback:270] Out: 7628 [Other:1456]   PHYSICAL EXAMINATION: General: obese elderly female in NAD on vent Neuro: Awake on vent, seems to respond to questions appropriately.  HEENT: [place]/AT. PERRL, unable to appreciate JVD Cardiovascular: RRR, no MRG Lungs: Respirations even and unlabored.  CTA bilaterally, No W/R/R. Abdomen: Soft,  non-tender, non-distended. Normoactive BS Musculoskeletal: No gross deformities, no edema.  Skin: Intact, warm, no rashes.  LABS:  BMET  Recent Labs Lab 02/24/17 1600 02/24/17 1856 02/24/17 2318 02/25/17 0310  NA 140 139 144 140  K 6.0* 5.5* 4.4 4.2  CL 102 110 110 100*  CO2 <7*  --  7* 13*  BUN 86* 93* 73* 55*  CREATININE 10.48* 10.80* 7.94* 5.97*  GLUCOSE 84 164* 208* 260*    Electrolytes  Recent Labs Lab 02/24/17 1600 02/24/17 1851 02/24/17 2318 02/25/17 0310  CALCIUM 8.2*  --  7.3* 7.2*  MG  --  1.7  --  1.4*  PHOS  --  12.6* 10.5* 7.5*    CBC  Recent Labs Lab 02/24/17 1600 02/24/17 1856 02/25/17 0310  WBC 27.8*  --  30.3*  HGB 10.2* 9.5* 9.3*  HCT 31.2* 28.0* 26.9*  PLT 419*  --  342    Coag's  Recent Labs Lab 02/24/17 1600 02/25/17 0310  APTT  --  160*  INR 1.36  --     Sepsis Markers  Recent Labs Lab 02/24/17 1856 02/24/17 2318 02/25/17 0050 02/25/17 0310  LATICACIDVEN 10.64* 10.1* 9.0*  --   PROCALCITON  --  1.41  --  1.41    ABG  Recent Labs Lab 02/24/17 2028 02/25/17 0124  PHART 6.894* 7.183*  PCO2ART <14.0* 19.8*  PO2ART 72.0* 76.1*    Liver Enzymes  Recent Labs Lab 02/24/17 1600 02/24/17 2318 02/25/17 0310  AST 26  140*  --   ALT 14 69*  --   ALKPHOS 80 64  --   BILITOT 0.5 0.9  --   ALBUMIN 3.0* 2.0* 2.6*    Cardiac Enzymes  Recent Labs Lab 02/24/17 1600 02/24/17 2318 02/25/17 0310  TROPONINI <0.03 0.03* 0.18*    Glucose  Recent Labs Lab 02/24/17 1911 02/24/17 2020 02/24/17 2034 02/24/17 2144 02/25/17 0017 02/25/17 0325  GLUCAP 152* 176* 158* 166* 204* 246*    Imaging Ct Abdomen Pelvis Wo Contrast  Result Date: 02/24/2017 CLINICAL DATA:  Epigastric pain EXAM: CT ABDOMEN AND PELVIS WITHOUT CONTRAST TECHNIQUE: Multidetector CT imaging of the abdomen and pelvis was performed following the standard protocol without IV contrast. COMPARISON:  None. FINDINGS: Significant respiratory motion  artifact compromises the study. Lower chest: Lung bases demonstrate no acute consolidation or pleural effusion. Borderline heart size. Minimal coronary artery calcification. Hepatobiliary: Calcified granuloma. No biliary dilatation. Status post cholecystectomy. Pancreas: Fuzzy appearance of the pancreas tail is suspected to be related to motion artifact. No ductal dilatation. Spleen: Normal in size without focal abnormality. Adrenals/Urinary Tract: Adrenal glands grossly within normal limits. No hydronephrosis. Bladder within normal limits Stomach/Bowel: Stomach nonenlarged. Small pill fragment. No dilated small bowel. No colon wall thickening. The appendix is slightly enlarged, measuring between 8 and 9 mm, no definitive surrounding inflammatory change. Vascular/Lymphatic: Aortic atherosclerosis. No enlarged abdominal or pelvic lymph nodes. Reproductive: Uterus and bilateral adnexa are unremarkable. Other: No free air or free fluid. Musculoskeletal: Posterior stabilization rods and fixating screws at L2-L3 with laminectomy changes from L2 through L4. Interbody disc devices at L2-L3, L3-L4 and L4-L5. IMPRESSION: 1. The examination is compromised by respiratory motion artifact. 2. The appendix is slightly enlarged at 8 to 9 mm but no surrounding inflammatory changes. Electronically Signed   By: Donavan Foil M.D.   On: 02/24/2017 18:15   Dg Chest 2 View  Result Date: 02/24/2017 CLINICAL DATA:  Chest pain, slurred speech and fatigue. EXAM: CHEST  2 VIEW COMPARISON:  12/21/2013 and prior radiographs FINDINGS: Cardiomegaly and pulmonary vascular congestion noted. Elevation of the right hemidiaphragm again identified. There is no evidence of focal airspace disease, pulmonary edema, suspicious pulmonary nodule/mass, pleural effusion, or pneumothorax. No acute bony abnormalities are identified. IMPRESSION: Cardiomegaly without evidence of acute cardiopulmonary disease. Electronically Signed   By: Margarette Canada M.D.   On:  02/24/2017 16:27   Ct Head Wo Contrast  Result Date: 02/24/2017 CLINICAL DATA:  Left-sided weakness. EXAM: CT HEAD WITHOUT CONTRAST TECHNIQUE: Contiguous axial images were obtained from the base of the skull through the vertex without intravenous contrast. COMPARISON:  None. FINDINGS: Brain: There is no evidence of acute infarct, intracranial hemorrhage, mass, midline shift, or extra-axial fluid collection. The ventricles and sulci are normal. Incidental dural calcification along the sphenoid wings. Vascular: Calcified atherosclerosis at the skullbase. No hyperdense vessel. Skull: No fracture or focal osseous lesion. Sinuses/Orbits: Visualized paranasal sinuses and mastoid air cells are clear. Bilateral cataract extraction is noted. Other: None. IMPRESSION: Unremarkable CT appearance of the brain. Electronically Signed   By: Logan Bores M.D.   On: 02/24/2017 16:45   US Renal  Result Date: 02/25/2017 CLINICAL DATA:  Acute kidney injury. EXAM: RENAL / URINARY TRACT ULTRASOUND COMPLETE COMPARISON:  CT 02/24/2017 FINDINGS: Right Kidney: Length: 10.8 cm. Echogenicity within normal limits. No mass or hydronephrosis visualized. Left Kidney: Length: 10.9 cm. Echogenicity within normal limits. No mass or hydronephrosis visualized. Bladder: Decompressed by Foley catheter and not well evaluated. IMPRESSION: Unremarkable renal ultrasound.  No obstructive uropathy. Urinary bladder decompressed by Foley catheter and not evaluated. Electronically Signed   By: Jeb Levering M.D.   On: 02/25/2017 01:59   Dg Chest Port 1 View  Result Date: 02/25/2017 CLINICAL DATA:  Respiratory failure. EXAM: PORTABLE CHEST 1 VIEW COMPARISON:  02/24/2017. FINDINGS: Patient rotated to the right. Endotracheal tube, NG tube, right IJ line in stable position. Cardiomegaly. Interim slight improvement of pulmonary vascular congestion and interstitial prominence. Small left pleural effusion. No pneumothorax. Postsurgical changes both  shoulders. IMPRESSION: 1. Lines and tubes in stable position. 2. Cardiomegaly with interim slight improvement pulmonary vascular congestion and pulmonary interstitial edema. Small left pleural effusion. Electronically Signed   By: Marcello Moores  Register   On: 02/25/2017 06:26   Dg Chest Port 1 View  Result Date: 02/24/2017 CLINICAL DATA:  ET tube, OG and line placement EXAM: PORTABLE CHEST 1 VIEW COMPARISON:  02/24/2017 FINDINGS: Insertion of right-sided central venous catheter, the tip projects over distal SVC. No right pneumothorax. Interval intubation, tip of the endotracheal tube is 1.8 cm superior to the carina. Esophageal tube tip is below the diaphragm. Possible ascending catheter with the tip positioned over the right atrial IVC junction. Stable cardiomegaly. Development of central vascular congestion and hazy asymmetric edema or infiltrate in the left chest. IMPRESSION: 1. Placement of support lines and tubes as above. Endotracheal tube tip approximately 1.8 cm superior to carina 2. Cardiomegaly. Development of central vascular congestion and asymmetric hazy opacity in the left thorax which may reflect asymmetric edema or infiltrate. Electronically Signed   By: Donavan Foil M.D.   On: 02/24/2017 23:38   Dg Abd Portable 1v  Result Date: 02/24/2017 CLINICAL DATA:  OG placement EXAM: PORTABLE ABDOMEN - 1 VIEW COMPARISON:  CT 02/24/2017 FINDINGS: Numerous support leads limit the exam. Esophageal tube tip overlies proximal stomach, side-port probably in the region of GE junction. Relatively gasless abdomen. Surgical hardware in the spine. IMPRESSION: Esophageal tube tip overlies proximal stomach, side-port probably at the GE junction, further advancement is suggested for more optimal positioning Electronically Signed   By: Donavan Foil M.D.   On: 02/24/2017 23:30     STUDIES:  CT head 5/16 > negative. CT A / P 5/16 > appendix slightly enlarged but no surrounding inflammatory changes. CXR 5/16 >  cardiomegaly. Echo 5/17 >  Renal US 5/16 > Unremarkable  CULTURES: Blood 5/16 >  Sputum 5/16 >  Urine 5/16 >   ANTIBIOTICS: Vanc 5/16 >  Zosyn 5/16 >   SIGNIFICANT EVENTS: 5/16 > admit.  LINES/TUBES: ETT 5/16 > HD cath 5/16 >    DISCUSSION: 71 y.o. female admitted 5/16 with acute renal failure, dehydration, and concern for sepsis.  Given multiple metabolic derangements, she was admitted to ICU. She was intubated for respiratory failure and ultimately required CRRT and additionally pressors for shock. She remains on both, although pressor demands have been decreasing.   ASSESSMENT / PLAN:  CARDIOVASCULAR A:  Shock unclear etiology. Septic vs hypovolemic. Now S/p 5L IVF resuscitation.  Troponin rise with c/o chest pain.  Hx HTN, HLD, CAD.  P:  Telemetry monitoring Continue levophed as needed to maintain goal MAP > 65. Trend troponin Echo pending Ensure lactic clearing Repeat EKG On heparin via CRRT Holding preadmission aspirin, atenolol, atorvastatin, benazepril, furosemide, imdur, nitro.  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  Also exacerbated by ACEi + NSAIDs. Hyperkalemia (improved) AGMA - lactate + uremia + metformin. P:  DC bicarb infusion, now on CRRT Nephrology following Correct electrolytes as indicated. BMP q12hrs.   INFECTIOUS A:   Concern for sepsis of unclear etiology.  A repeat sepsis assessment has been performed. P:   Abx as above (vanc / zosyn).  Follow cultures as above. PCT algorithm to limit abx exposure. (1.41 no data to trend at this time.)  PULMONARY A: Uncompensated met acidosis ARF P:   Keep high MV abg to follow No SBT  GASTROINTESTINAL A:   Nutrition. Nausea with vomiting - CT A / P negative. P:   Start TF Zofran PRN.  HEMATOLOGIC A:   VTE Prophylaxis. P:  SCD's / heparin. CBC in AM.  ENDOCRINE A:   DM. P:   SSI and CBG monitoring Hold  preadmission metformin, victoza, lantus.  NEUROLOGIC A:   Acute encephalopathy - ? Due to sepsis vs uremia vs metabolic vs CVA (though CVA less likely given non-focal exam, initial head CT neg). P:   Continue supportive care as outlined above. RASS goal -1 to -2 Fentanyl in fusion, PRN versed for sedation.  Consider repeat CT vs MRI if no improvement. Hold preadmission lyrica, paroxetine, zolpidem.  Family updated: Family updated overnight by   Interdisciplinary Family Meeting v Palliative Care Meeting:  Due by: 03/02/17  CC time: 30 min.   Georgann Housekeeper, AGACNP-BC [facility] Pager [phone] or [phone]  02/25/2017 7:49 AM  STAFF NOTE: Linwood Dibbles, MD FACP have personally reviewed patient's available data, including medical history, events of note, physical examination and test results as part of my evaluation. I have discussed with resident/NP and other care providers such as pharmacist, RN and RRT. In addition, I personally evaluated patient and elicited key findings of: follows commands, rass -1, CTA bilateral to bases, abdo soft, BS wnl, no r/g, no edema, clinical presentation c/w ARF from ACEI, hypovolemia, metformin, nsaids, now with MODS requiring emergent cvvhd, I reviewed personally all her CT, with neg CT head, pcxr atx but no infiltrates, last abg reviewed, requires high MV, repeat abg on rate 35, NO SBT with current abg we have, abdo neg on ct also I reviewed if abg resolves ph would lower RR and consider SBT in future, pcxr repeat, I do not see infection and pct I think is fp from arf, can keep empiric abx for now, pct repeat in future to help de escalate and dc abx, feed today and follow clinically, ecg to repeat, levo off, goal vaso to off, no family in room, fent drip rass at goal The patient is critically ill with multiple organ systems failure and requires high complexity decision making for assessment and support, frequent  evaluation and titration of therapies, application of advanced monitoring technologies and extensive interpretation of multiple databases.   Critical Care Time devoted to patient care services described in this note is 30 Minutes. This time reflects time of care of this signee: Merrie Roof, MD FACP. This critical care time does not reflect procedure time, or teaching time or supervisory time of PA/NP/Med student/Med Resident etc but could involve care discussion time. Rest per NP/medical resident whose note is outlined above and that I agree with   Lavon Paganini. Titus Mould, MD, [place] Pgr: [facility] 02/25/2017 9:36 AM

## 2017-02-25 NOTE — Progress Notes (Signed)
  PHARMACY - PHYSICIAN COMMUNICATION CRITICAL VALUE ALERT - BLOOD CULTURE IDENTIFICATION (BCID)  Results for orders placed or performed during the hospital encounter of 02/24/17  Blood Culture ID Panel (Reflexed) (Collected: 02/24/2017  4:00 PM)  Result Value Ref Range   Enterococcus species NOT DETECTED NOT DETECTED   Listeria monocytogenes NOT DETECTED NOT DETECTED   Staphylococcus species DETECTED (A) NOT DETECTED   Staphylococcus aureus NOT DETECTED NOT DETECTED   Methicillin resistance NOT DETECTED NOT DETECTED   Streptococcus species NOT DETECTED NOT DETECTED   Streptococcus agalactiae NOT DETECTED NOT DETECTED   Streptococcus pneumoniae NOT DETECTED NOT DETECTED   Streptococcus pyogenes NOT DETECTED NOT DETECTED   Acinetobacter baumannii NOT DETECTED NOT DETECTED   Enterobacteriaceae species NOT DETECTED NOT DETECTED   Enterobacter cloacae complex NOT DETECTED NOT DETECTED   Escherichia coli NOT DETECTED NOT DETECTED   Klebsiella oxytoca NOT DETECTED NOT DETECTED   Klebsiella pneumoniae NOT DETECTED NOT DETECTED   Proteus species NOT DETECTED NOT DETECTED   Serratia marcescens NOT DETECTED NOT DETECTED   Haemophilus influenzae NOT DETECTED NOT DETECTED   Neisseria meningitidis NOT DETECTED NOT DETECTED   Pseudomonas aeruginosa NOT DETECTED NOT DETECTED   Candida albicans NOT DETECTED NOT DETECTED   Candida glabrata NOT DETECTED NOT DETECTED   Candida krusei NOT DETECTED NOT DETECTED   Candida parapsilosis NOT DETECTED NOT DETECTED   Candida tropicalis NOT DETECTED NOT DETECTED    Name of physician (or Provider) Contacted: Sommer   Changes to prescribed antibiotics required: None - likely just a contaminant. Per CCM evaluating antibiotic LOT and considering d/c soon.  Thank you for allowing pharmacy to be a part of this patient's care.  Alycia Rossetti, PharmD, BCPS Clinical Pharmacist Pager: [phone] 02/25/2017 7:21 PM

## 2017-02-25 NOTE — Progress Notes (Addendum)
Pt arrived from ED with general weakness alert following commands and maex4. Speech soft low and weak.Systolic bp 08H, levo drip and 4th NS bolus infusing. Pt daughter states md spoke to family in ED regarding "life support". At this moment, daughter  states "we want her on life support". Daughter asked pt do you want "life support" and this RN witnessed pt nod her head yes. Correction in Date of Service.Marland KitchenMarland KitchenDate of this Service is 2130 and not 1530

## 2017-02-25 NOTE — Progress Notes (Signed)
[facility] Progress Note Patient Name: ZYIONNA PESCE DOB: 1945/12/04 MRN: [id number]   Date of Service  02/25/2017  HPI/Events of Note  Troponin = 0.18 >> 0.56 >>  0.86. BP = 99/46 off vasopressors. Suspect demand ischemia. Cardiac echo already ordered, however, not done yet.   eICU Interventions  Will order: 1. 12 Lead EKG now.  2. ASA 81 mg per tube now and Q day.  3. Metoprolol 2.5 mg IV Q 6 hours. Hold for SBP < 100 or HR < 60.  4. Continue to trend troponin.  5. Await results of cardiac echo.      Intervention Category Intermediate Interventions: Diagnostic test evaluation  Sommer,Steven Cornelia Copa 02/25/2017, 7:17 PM

## 2017-02-25 NOTE — Progress Notes (Signed)
   02/25/17 0955  Clinical Encounter Type  Visited With Patient  Visit Type Follow-up  Spiritual Encounters  Spiritual Needs Prayer  Stress Factors  Patient Stress Factors Health changes  Introduction to Pt who was intubated. Offered prayer of peace and comfort.

## 2017-02-25 NOTE — Procedures (Signed)
Hemodialysis Catheter Insertion Procedure Note [name] [name] [id number] 11/23/1945  Procedure: Insertion of Hemodialysis Catheter Indications: CRRT  Procedure Details Consent: Risks of procedure as well as the alternatives and risks of each were explained to the (patient/caregiver).  Consent for procedure obtained. Time Out: Verified patient identification, verified procedure, site/side was marked, verified correct patient position, special equipment/implants available, medications/allergies/relevent history reviewed, required imaging and test results available.  Performed  Maximum sterile technique was used including antiseptics, cap, gloves, gown, hand hygiene, mask and sheet. Skin prep: Chlorhexidine; local anesthetic administered A antimicrobial bonded/coated triple lumen catheter was placed in the right internal jugular vein using the Seldinger technique.  Evaluation Blood flow good Complications: No apparent complications Patient did tolerate procedure well. Chest X-ray ordered to verify placement.  CXR: normal.  Procedure performed under direct ultrasound guidance for real time vessel cannulation.      Montey Hora, [facility] Pgr: [phone]  or [phone] 02/25/2017, 2:15 AM

## 2017-02-25 NOTE — Progress Notes (Signed)
CRITICAL VALUE ALERT  Critical value received:  Lactic Acid 9.0  Date of notification:  02/25/17  Time of notification:  0138  Critical value read back:Yes.    Nurse who received alert:  Donald Siva RN  MD notified (1st page):  Dr. Alva Garnet  Time of first page:  0140  MD notified (2nd page):  Time of second page:  Responding MD:  Dr. Alva Garnet  Time MD responded:  [phone]

## 2017-02-25 NOTE — Progress Notes (Signed)
Initial Nutrition Assessment  DOCUMENTATION CODES:   Obesity unspecified  INTERVENTION:    Vital High Protein at 55 ml/h (1320 ml per day)   Provides 1320 kcal, 116 gm protein, 1104 ml free water daily  NUTRITION DIAGNOSIS:   Inadequate oral intake related to inability to eat as evidenced by NPO status.  GOAL:   Provide needs based on ASPEN/SCCM guidelines  MONITOR:   Vent status, TF tolerance, Skin, Labs, I & O's  REASON FOR ASSESSMENT:   Consult Enteral/tube feeding initiation and management  ASSESSMENT:   71 y.o. female admitted 5/16 with acute renal failure, dehydration, and concern for sepsis.  Given multiple metabolic derangements, she was admitted to ICU. She was intubated for respiratory failure.  Discussed patient in ICU rounds and with RN today. CRRT was initiated last night. Received MD Consult for TF initiation and management. Nutrition-Focused physical exam completed. Findings are no fat depletion, no muscle depletion, and no edema.  Patient is currently intubated on ventilator support MV: 16 Temp (24hrs), Avg:93.8 F (34.3 C), Min:91.7 F (33.2 C), Max:97.4 F (36.3 C)  Propofol: none Labs and medications reviewed.  Diet Order:  Diet NPO time specified  Skin:  Wound (see comment) (MASD & skin tear to L breast)  Last BM:  PTA  Height:   Ht Readings from Last 1 Encounters:  02/25/17 5\' 3"  (1.6 m)    Weight:   Wt Readings from Last 1 Encounters:  02/25/17 224 lb 6.9 oz (101.8 kg)    Ideal Body Weight:  52.3 kg  BMI:  Body mass index is 39.76 kg/m.  Estimated Nutritional Needs:   Kcal:  2458-0998  Protein:  > 105 gm  Fluid:  1.5-2 L  EDUCATION NEEDS:   No education needs identified at this time  Molli Barrows, [place], [place], [place] Pager [phone] After Hours Pager [phone]

## 2017-02-25 NOTE — Progress Notes (Signed)
Subjective:  Hypothermic, hypotensive overnight- also sedated on vent.  CRRT started- labs this AM do not reflect that much CRRT time - no UOP recorded  Objective Vital signs in last 24 hours: Vitals:   02/25/17 0600 02/25/17 0615 02/25/17 0630 02/25/17 0645  BP: (!) 114/57 (!) 103/54 (!) 120/55 122/60  Pulse: 72 73 74 75  Resp: (!) 35 (!) 35 (!) 35 (!) 31  Temp:      TempSrc:      SpO2: 98% 97% 98% 98%  Weight:       Weight change:   Intake/Output Summary (Last 24 hours) at 02/25/17 3299 Last data filed at 02/25/17 0700  Gross per 24 hour  Intake          2625.01 ml  Output             1456 ml  Net          1169.01 ml    Assessment/ Plan: Pt is a 71 y.o. yo female with DM and HTN but creatinine of 1.1 on 12/10/16 who was admitted on 02/24/2017 with seems like sepsis syndrome with AKI- severe acidosis and hyperkalemia- home meds included ACE, NSAID and potassium  Assessment/Plan: 1. Renal- AKI in the setting of extreme hypotension on ACE/NSAID- U/A pretty bland- CT no obstruction.  Suspect ATN.  Right now requiring CRRT due to hemodynamic instability/acidosis and hyperkalemia- using heparin with machine 2. HTN/vol- no volume removal with HD-  Think she needs volume outside the machine- per CCM, has been bolused 3. Anemia- hgb 9.3- watch for now- transfuse as needed 4. Metabolic acidosis- using bicarb as a replacement fluid with CRRT- last bicarb still 13- also getting bicarb outside machine- will stop as does not need- hope that PM labs will be better  5. Hyperkalemia- corrected with CRRT- pre and dialysate 4 K - to continue 6. Sepsis?  - on zosyn/vanc- CT negative for source- per CCM  Meghan Tiemann A    Labs: Basic Metabolic Panel:  Recent Labs Lab 02/24/17 1600 02/24/17 1851 02/24/17 1856 02/24/17 2318 02/25/17 0310  NA 140  --  139 144 140  K 6.0*  --  5.5* 4.4 4.2  CL 102  --  110 110 100*  CO2 <7*  --   --  7* 13*  GLUCOSE 84  --  164* 208* 260*  BUN 86*  --   93* 73* 55*  CREATININE 10.48*  --  10.80* 7.94* 5.97*  CALCIUM 8.2*  --   --  7.3* 7.2*  PHOS  --  12.6*  --  10.5* 7.5*   Liver Function Tests:  Recent Labs Lab 02/24/17 1600 02/24/17 2318 02/25/17 0310  AST 26 140*  --   ALT 14 69*  --   ALKPHOS 80 64  --   BILITOT 0.5 0.9  --   PROT 7.5 5.4*  --   ALBUMIN 3.0* 2.0* 2.6*    Recent Labs Lab 02/24/17 1600  LIPASE 205*   No results for input(s): AMMONIA in the last 168 hours. CBC:  Recent Labs Lab 02/24/17 1600 02/24/17 1856 02/25/17 0310  WBC 27.8*  --  30.3*  NEUTROABS 22.3*  --   --   HGB 10.2* 9.5* 9.3*  HCT 31.2* 28.0* 26.9*  MCV 86.2  --  82.3  PLT 419*  --  342   Cardiac Enzymes:  Recent Labs Lab 02/24/17 1600 02/24/17 1916 02/24/17 2318 02/25/17 0310  CKTOTAL  --  62  --   --  TROPONINI <0.03  --  0.03* 0.18*   CBG:  Recent Labs Lab 02/24/17 2020 02/24/17 2034 02/24/17 2144 02/25/17 0017 02/25/17 0325  GLUCAP 176* 158* 166* 204* 246*    Iron Studies:  Recent Labs  02/24/17 2318  IRON 43  TIBC 158*   Studies/Results: Ct Abdomen Pelvis Wo Contrast  Result Date: 02/24/2017 CLINICAL DATA:  Epigastric pain EXAM: CT ABDOMEN AND PELVIS WITHOUT CONTRAST TECHNIQUE: Multidetector CT imaging of the abdomen and pelvis was performed following the standard protocol without IV contrast. COMPARISON:  None. FINDINGS: Significant respiratory motion artifact compromises the study. Lower chest: Lung bases demonstrate no acute consolidation or pleural effusion. Borderline heart size. Minimal coronary artery calcification. Hepatobiliary: Calcified granuloma. No biliary dilatation. Status post cholecystectomy. Pancreas: Fuzzy appearance of the pancreas tail is suspected to be related to motion artifact. No ductal dilatation. Spleen: Normal in size without focal abnormality. Adrenals/Urinary Tract: Adrenal glands grossly within normal limits. No hydronephrosis. Bladder within normal limits Stomach/Bowel:  Stomach nonenlarged. Small pill fragment. No dilated small bowel. No colon wall thickening. The appendix is slightly enlarged, measuring between 8 and 9 mm, no definitive surrounding inflammatory change. Vascular/Lymphatic: Aortic atherosclerosis. No enlarged abdominal or pelvic lymph nodes. Reproductive: Uterus and bilateral adnexa are unremarkable. Other: No free air or free fluid. Musculoskeletal: Posterior stabilization rods and fixating screws at L2-L3 with laminectomy changes from L2 through L4. Interbody disc devices at L2-L3, L3-L4 and L4-L5. IMPRESSION: 1. The examination is compromised by respiratory motion artifact. 2. The appendix is slightly enlarged at 8 to 9 mm but no surrounding inflammatory changes. Electronically Signed   By: Donavan Foil M.D.   On: 02/24/2017 18:15   Dg Chest 2 View  Result Date: 02/24/2017 CLINICAL DATA:  Chest pain, slurred speech and fatigue. EXAM: CHEST  2 VIEW COMPARISON:  12/21/2013 and prior radiographs FINDINGS: Cardiomegaly and pulmonary vascular congestion noted. Elevation of the right hemidiaphragm again identified. There is no evidence of focal airspace disease, pulmonary edema, suspicious pulmonary nodule/mass, pleural effusion, or pneumothorax. No acute bony abnormalities are identified. IMPRESSION: Cardiomegaly without evidence of acute cardiopulmonary disease. Electronically Signed   By: Margarette Canada M.D.   On: 02/24/2017 16:27   Ct Head Wo Contrast  Result Date: 02/24/2017 CLINICAL DATA:  Left-sided weakness. EXAM: CT HEAD WITHOUT CONTRAST TECHNIQUE: Contiguous axial images were obtained from the base of the skull through the vertex without intravenous contrast. COMPARISON:  None. FINDINGS: Brain: There is no evidence of acute infarct, intracranial hemorrhage, mass, midline shift, or extra-axial fluid collection. The ventricles and sulci are normal. Incidental dural calcification along the sphenoid wings. Vascular: Calcified atherosclerosis at the  skullbase. No hyperdense vessel. Skull: No fracture or focal osseous lesion. Sinuses/Orbits: Visualized paranasal sinuses and mastoid air cells are clear. Bilateral cataract extraction is noted. Other: None. IMPRESSION: Unremarkable CT appearance of the brain. Electronically Signed   By: Logan Bores M.D.   On: 02/24/2017 16:45   US Renal  Result Date: 02/25/2017 CLINICAL DATA:  Acute kidney injury. EXAM: RENAL / URINARY TRACT ULTRASOUND COMPLETE COMPARISON:  CT 02/24/2017 FINDINGS: Right Kidney: Length: 10.8 cm. Echogenicity within normal limits. No mass or hydronephrosis visualized. Left Kidney: Length: 10.9 cm. Echogenicity within normal limits. No mass or hydronephrosis visualized. Bladder: Decompressed by Foley catheter and not well evaluated. IMPRESSION: Unremarkable renal ultrasound.  No obstructive uropathy. Urinary bladder decompressed by Foley catheter and not evaluated. Electronically Signed   By: Jeb Levering M.D.   On: 02/25/2017 01:59   Dg Chest  Port 1 View  Result Date: 02/24/2017 CLINICAL DATA:  ET tube, OG and line placement EXAM: PORTABLE CHEST 1 VIEW COMPARISON:  02/24/2017 FINDINGS: Insertion of right-sided central venous catheter, the tip projects over distal SVC. No right pneumothorax. Interval intubation, tip of the endotracheal tube is 1.8 cm superior to the carina. Esophageal tube tip is below the diaphragm. Possible ascending catheter with the tip positioned over the right atrial IVC junction. Stable cardiomegaly. Development of central vascular congestion and hazy asymmetric edema or infiltrate in the left chest. IMPRESSION: 1. Placement of support lines and tubes as above. Endotracheal tube tip approximately 1.8 cm superior to carina 2. Cardiomegaly. Development of central vascular congestion and asymmetric hazy opacity in the left thorax which may reflect asymmetric edema or infiltrate. Electronically Signed   By: Donavan Foil M.D.   On: 02/24/2017 23:38   Dg Abd Portable  1v  Result Date: 02/24/2017 CLINICAL DATA:  OG placement EXAM: PORTABLE ABDOMEN - 1 VIEW COMPARISON:  CT 02/24/2017 FINDINGS: Numerous support leads limit the exam. Esophageal tube tip overlies proximal stomach, side-port probably in the region of GE junction. Relatively gasless abdomen. Surgical hardware in the spine. IMPRESSION: Esophageal tube tip overlies proximal stomach, side-port probably at the GE junction, further advancement is suggested for more optimal positioning Electronically Signed   By: Donavan Foil M.D.   On: 02/24/2017 23:30   Medications: Infusions: . sodium chloride 250 mL (02/25/17 0341)  . fentaNYL infusion INTRAVENOUS 100 mcg/hr (02/25/17 0340)  . heparin 10,000 units/ 20 mL infusion syringe 1,200 Units/hr (02/25/17 0204)  . norepinephrine (LEVOPHED) Adult infusion 14 mcg/min (02/25/17 0658)  . piperacillin-tazobactam (ZOSYN)  IV Stopped (02/25/17 0536)  . dialysis replacement fluid (prismasate) 800 mL/hr at 02/25/17 0614  . dialysate (PRISMASATE) 2,000 mL/hr at 02/25/17 0514  .  sodium bicarbonate  infusion 1000 mL 100 mL/hr at 02/25/17 0600  . sodium bicarbonate (isotonic) 1000 mL infusion 250 mL/hr at 02/25/17 0347  . sodium chloride 1,000 mL (02/24/17 2348)  . vancomycin    . vasopressin (PITRESSIN) infusion - *FOR SHOCK* 0.03 Units/min (02/24/17 2310)    Scheduled Medications: . calcium gluconate      . chlorhexidine gluconate (MEDLINE KIT)  15 mL Mouth Rinse BID  . fentaNYL      . fentaNYL (SUBLIMAZE) injection  50 mcg Intravenous Once  . heparin  5,000 Units Subcutaneous Q8H  . insulin aspart  0-15 Units Subcutaneous Q4H  . mouth rinse  15 mL Mouth Rinse QID  . pantoprazole (PROTONIX) IV  40 mg Intravenous Daily  . sodium bicarbonate        have reviewed scheduled and prn medications.  Physical Exam: General: wrapped up as is hypothermic on CRRT  Heart: RRR Lungs: CBS bilat  Abdomen: soft, non tender Extremities: no peripheral edema Dialysis  Access: right IJ HD cath placed 5/16     02/25/2017,7:12 AM  LOS: 1 day

## 2017-02-26 ENCOUNTER — Inpatient Hospital Stay (HOSPITAL_COMMUNITY): Payer: Medicare Other

## 2017-02-26 DIAGNOSIS — N179 Acute kidney failure, unspecified: Secondary | ICD-10-CM

## 2017-02-26 DIAGNOSIS — I350 Nonrheumatic aortic (valve) stenosis: Secondary | ICD-10-CM

## 2017-02-26 LAB — PHOSPHORUS
Phosphorus: 1.5 mg/dL — ABNORMAL LOW (ref 2.5–4.6)
Phosphorus: 2.6 mg/dL (ref 2.5–4.6)

## 2017-02-26 LAB — RENAL FUNCTION PANEL
Albumin: 2.4 g/dL — ABNORMAL LOW (ref 3.5–5.0)
Albumin: 2.4 g/dL — ABNORMAL LOW (ref 3.5–5.0)
Anion gap: 12 (ref 5–15)
Anion gap: 9 (ref 5–15)
BUN: 20 mg/dL (ref 6–20)
BUN: 12 mg/dL (ref 6–20)
CO2: 26 mmol/L (ref 22–32)
CO2: 26 mmol/L (ref 22–32)
Calcium: 7.2 mg/dL — ABNORMAL LOW (ref 8.9–10.3)
Calcium: 7.2 mg/dL — ABNORMAL LOW (ref 8.9–10.3)
Chloride: 98 mmol/L — ABNORMAL LOW (ref 101–111)
Chloride: 102 mmol/L (ref 101–111)
Creatinine, Ser: 2.03 mg/dL — ABNORMAL HIGH (ref 0.44–1.00)
Creatinine, Ser: 1.42 mg/dL — ABNORMAL HIGH (ref 0.44–1.00)
GFR calc Af Amer: 27 mL/min — ABNORMAL LOW (ref >60)
GFR calc Af Amer: 42 mL/min — ABNORMAL LOW (ref >60)
GFR calc non Af Amer: 24 mL/min — ABNORMAL LOW (ref >60)
GFR calc non Af Amer: 36 mL/min — ABNORMAL LOW (ref >60)
Glucose, Bld: 186 mg/dL — ABNORMAL HIGH (ref 65–99)
Glucose, Bld: 206 mg/dL — ABNORMAL HIGH (ref 65–99)
Phosphorus: 1.6 mg/dL — ABNORMAL LOW (ref 2.5–4.6)
Phosphorus: 2.7 mg/dL (ref 2.5–4.6)
Potassium: 3.5 mmol/L (ref 3.5–5.1)
Potassium: 4.2 mmol/L (ref 3.5–5.1)
Sodium: 136 mmol/L (ref 135–145)
Sodium: 137 mmol/L (ref 135–145)

## 2017-02-26 LAB — GLUCOSE, CAPILLARY
Glucose-Capillary: 110 mg/dL — ABNORMAL HIGH (ref 65–99)
Glucose-Capillary: 160 mg/dL — ABNORMAL HIGH (ref 65–99)
Glucose-Capillary: 176 mg/dL — ABNORMAL HIGH (ref 65–99)
Glucose-Capillary: 178 mg/dL — ABNORMAL HIGH (ref 65–99)
Glucose-Capillary: 211 mg/dL — ABNORMAL HIGH (ref 65–99)
Glucose-Capillary: 214 mg/dL — ABNORMAL HIGH (ref 65–99)

## 2017-02-26 LAB — BASIC METABOLIC PANEL
Anion gap: 15 (ref 5–15)
Anion gap: 10 (ref 5–15)
BUN: 24 mg/dL — ABNORMAL HIGH (ref 6–20)
BUN: 14 mg/dL (ref 6–20)
CO2: 27 mmol/L (ref 22–32)
CO2: 27 mmol/L (ref 22–32)
Calcium: 7 mg/dL — ABNORMAL LOW (ref 8.9–10.3)
Calcium: 7.2 mg/dL — ABNORMAL LOW (ref 8.9–10.3)
Chloride: 95 mmol/L — ABNORMAL LOW (ref 101–111)
Chloride: 101 mmol/L (ref 101–111)
Creatinine, Ser: 2.55 mg/dL — ABNORMAL HIGH (ref 0.44–1.00)
Creatinine, Ser: 1.7 mg/dL — ABNORMAL HIGH (ref 0.44–1.00)
GFR calc Af Amer: 21 mL/min — ABNORMAL LOW (ref >60)
GFR calc Af Amer: 34 mL/min — ABNORMAL LOW (ref >60)
GFR calc non Af Amer: 18 mL/min — ABNORMAL LOW (ref >60)
GFR calc non Af Amer: 29 mL/min — ABNORMAL LOW (ref >60)
Glucose, Bld: 133 mg/dL — ABNORMAL HIGH (ref 65–99)
Glucose, Bld: 174 mg/dL — ABNORMAL HIGH (ref 65–99)
Potassium: 3.7 mmol/L (ref 3.5–5.1)
Potassium: 3.9 mmol/L (ref 3.5–5.1)
Sodium: 137 mmol/L (ref 135–145)
Sodium: 138 mmol/L (ref 135–145)

## 2017-02-26 LAB — POCT ACTIVATED CLOTTING TIME
Activated Clotting Time: 224 seconds
Activated Clotting Time: 224 seconds
Activated Clotting Time: 235 seconds
Activated Clotting Time: 241 seconds
Activated Clotting Time: 246 seconds

## 2017-02-26 LAB — TROPONIN I
Troponin I: 0.67 ng/mL (ref <0.03)
Troponin I: 0.57 ng/mL (ref <0.03)
Troponin I: 0.43 ng/mL (ref <0.03)

## 2017-02-26 LAB — CBC
HCT: 24.8 % — ABNORMAL LOW (ref 36.0–46.0)
Hemoglobin: 8.8 g/dL — ABNORMAL LOW (ref 12.0–15.0)
MCH: 28.2 pg (ref 26.0–34.0)
MCHC: 35.5 g/dL (ref 30.0–36.0)
MCV: 79.5 fL (ref 78.0–100.0)
Platelets: 281 10*3/uL (ref 150–400)
RBC: 3.12 MIL/uL — ABNORMAL LOW (ref 3.87–5.11)
RDW: 14.5 % (ref 11.5–15.5)
WBC: 8.8 10*3/uL (ref 4.0–10.5)

## 2017-02-26 LAB — POCT I-STAT 3, ART BLOOD GAS (G3+)
Bicarbonate: 27.3 mmol/L (ref 20.0–28.0)
O2 Saturation: 94 %
Patient temperature: 96.1
TCO2: 29 mmol/L (ref 0–100)
pCO2 arterial: 54.4 mmHg — ABNORMAL HIGH (ref 32.0–48.0)
pH, Arterial: 7.301 — ABNORMAL LOW (ref 7.350–7.450)
pO2, Arterial: 76 mmHg — ABNORMAL LOW (ref 83.0–108.0)

## 2017-02-26 LAB — URINE CULTURE: Culture: NO GROWTH

## 2017-02-26 LAB — PROCALCITONIN: Procalcitonin: 1.99 ng/mL

## 2017-02-26 LAB — LACTIC ACID, PLASMA: Lactic Acid, Venous: 2.1 mmol/L (ref 0.5–1.9)

## 2017-02-26 LAB — ECHOCARDIOGRAM COMPLETE
Height: 63 in
Weight: 3597.91 oz

## 2017-02-26 LAB — HEMOGLOBIN A1C
Hgb A1c MFr Bld: 6.7 % — ABNORMAL HIGH (ref 4.8–5.6)
Mean Plasma Glucose: 146 mg/dL

## 2017-02-26 LAB — HCV COMMENT:

## 2017-02-26 LAB — HEPATITIS B SURFACE ANTIGEN: Hepatitis B Surface Ag: NEGATIVE

## 2017-02-26 LAB — APTT: aPTT: >200 seconds (ref 24–36)

## 2017-02-26 LAB — MAGNESIUM
Magnesium: 2.1 mg/dL (ref 1.7–2.4)
Magnesium: 2.2 mg/dL (ref 1.7–2.4)

## 2017-02-26 LAB — HEPATITIS C ANTIBODY (REFLEX): HCV Ab: <0.1 s/co ratio (ref 0.0–0.9)

## 2017-02-26 MED ORDER — DEXTROSE 5 % IV SOLN
1.0000 g | INTRAVENOUS | Status: DC
  Administered 2017-02-26 – 2017-02-27 (×2): 1 g via INTRAVENOUS
  Filled 2017-02-26 (×3): qty 10

## 2017-02-26 MED ORDER — INSULIN GLARGINE 100 UNIT/ML ~~LOC~~ SOLN
5.0000 [IU] | Freq: Every day | SUBCUTANEOUS | Status: DC
  Administered 2017-02-26 – 2017-03-01 (×4): 5 [IU] via SUBCUTANEOUS
  Filled 2017-02-26 (×4): qty 0.05

## 2017-02-26 MED ORDER — POTASSIUM PHOSPHATES 15 MMOLE/5ML IV SOLN
30.0000 mmol | Freq: Once | INTRAVENOUS | Status: AC
  Administered 2017-02-26: 30 mmol via INTRAVENOUS
  Filled 2017-02-26: qty 10

## 2017-02-26 MED ORDER — DEXTROSE 5 % IV SOLN: 30.0000 mmol | Freq: Once | INTRAVENOUS | Status: DC

## 2017-02-26 NOTE — Progress Notes (Signed)
PULMONARY / CRITICAL CARE MEDICINE   Name: [name] [name] MRN: [id number] DOB: January 23, 1946    ADMISSION DATE:  02/24/2017 CONSULTATION DATE:  02/24/17  REFERRING MD:  Billy Fischer  CHIEF COMPLAINT:  AMS  HISTORY OF PRESENT ILLNESS:  Pt is encephelopathic; therefore, this HPI is obtained from chart review. [name] [name] is a 71 y.o. female with PMH as outlined below. She was brought to [facility] ED 5/16 after daughter thought she was possible having a stroke due to slurred speech and inability to walk.  She also had been complaining of vague abdominal pain, 3 days of nonbloody nonbilious vomiting, 3 days of nonbloody diarrhea, and generalized weakness with worsening fatigue. In ED, she was found to be altered and had multiple metabolic derangements including acute renal failure with SCr of 10.48, hyperkalemia with K 6, AGMA, lactate 10.79.  CT of the head, abdomen / pelvis were all normal. Given her metabolic derangements and concern for deterioration with need for intubation, PCCM was asked to admit to ICU.  SUBJECTIVE/INTERVAL HISTORY: Remains off pressors. Still CRRT. Not tolerating SBT well this AM  VITAL SIGNS: BP (!) 104/53   Pulse 68   Temp (!) 96.6 F (35.9 C)   Resp (!) 23   Ht '5\' 3"'  (1.6 m)   Wt 102 kg (224 lb 13.9 oz)   SpO2 100%   BMI 39.83 kg/m   HEMODYNAMICS:    VENTILATOR SETTINGS: Vent Mode: PRVC FiO2 (%):  [30 %-50 %] 30 % Set Rate:  [24 bmp-35 bmp] 24 bmp Vt Set:  [460 mL] 460 mL PEEP:  [5 cmH20] 5 cmH20 Plateau Pressure:  [20 SWN46-27 cmH20] 22 cmH20  INTAKE / OUTPUT: I/O last 3 completed shifts: In: 5050.3 [I.V.:2809.2; Other:40; NG/GT:1240; IV Piggyback:961.1] Out: 0350 [Urine:136; Other:3878]   PHYSICAL EXAMINATION:  General: Obese elderly female in NAD Neuro: Awake on vent, responds appropriately and follows commands.  HEENT: [place]/AT, PERRL, unable to appreciate JVD Cardiovascular: RRR, no MRG Lungs: Respirations even, unlabored. Clear BS.  Abdomen: Soft,  non-tender, non-distended Musculoskeletal: No gross deformities, no edema.  Skin: Intact, warm, no rashes.  LABS:  BMET  Recent Labs Lab 02/25/17 1748 02/25/17 2107 02/25/17 2309 02/26/17 0511  NA 139 140 137 136  K 3.8 3.7 3.7 3.5  CL 98*  --  95* 98*  CO2 24  --  27 26  BUN 28*  --  24* 20  CREATININE 3.08*  --  2.55* 2.03*  GLUCOSE 115*  --  133* 186*    Electrolytes  Recent Labs Lab 02/25/17 0310 02/25/17 1020 02/25/17 1748 02/25/17 2309 02/26/17 0511  CALCIUM 7.2* 6.9* 6.9* 7.0* 7.2*  MG 1.4*  --  2.1  --  2.1  PHOS 7.5* 3.8 2.3*  2.4*  --  1.5*  1.6*    CBC  Recent Labs Lab 02/24/17 1600  02/25/17 0310 02/25/17 2107 02/26/17 0511  WBC 27.8*  --  30.3*  --  8.8  HGB 10.2*  < > 9.3* 8.2* 8.8*  HCT 31.2*  < > 26.9* 24.0* 24.8*  PLT 419*  --  342  --  281  < > = values in this interval not displayed.  Coag's  Recent Labs Lab 02/24/17 1600 02/25/17 0310 02/26/17 0511  APTT  --  160* >200*  INR 1.36  --   --     Sepsis Markers  Recent Labs Lab 02/24/17 2318 02/25/17 0050 02/25/17 0310 02/25/17 0744 02/26/17 0511  LATICACIDVEN 10.1* 9.0*  --  8.1*  --  PROCALCITON 1.41  --  1.41  --  1.99    ABG  Recent Labs Lab 02/25/17 0124 02/25/17 1222 02/25/17 1417  PHART 7.183* 7.506* 7.478*  PCO2ART 19.8* 23.2* 28.2*  PO2ART 76.1* 139.0* 92.0    Liver Enzymes  Recent Labs Lab 02/24/17 1600 02/24/17 2318 02/25/17 0310 02/25/17 1748 02/26/17 0511  AST 26 140*  --   --   --   ALT 14 69*  --   --   --   ALKPHOS 80 64  --   --   --   BILITOT 0.5 0.9  --   --   --   ALBUMIN 3.0* 2.0* 2.6* 2.3* 2.4*    Cardiac Enzymes  Recent Labs Lab 02/25/17 1748 02/25/17 2309 02/26/17 0511  TROPONINI 0.86* 0.81* 0.67*    Glucose  Recent Labs Lab 02/25/17 0842 02/25/17 1230 02/25/17 1558 02/25/17 2019 02/26/17 0026 02/26/17 0415  GLUCAP 182* 98 88 155* 110* 160*    Imaging Dg Chest Port 1 View  Result Date:  02/26/2017 CLINICAL DATA:  Respiratory failure. EXAM: PORTABLE CHEST 1 VIEW COMPARISON:  02/25/2017 FINDINGS: An endotracheal tube 2.1 cm above the carina, right IJ central venous catheter with tip overlying the mid SVC, and NG tube entering the stomach again identified. This is a mildly low volume film. Cardiomegaly, mild pulmonary vascular congestion and mild bibasilar atelectasis again noted. There is no evidence of pneumothorax or pleural effusion. IMPRESSION: Little significant change since the prior study with cardiomegaly, mild pulmonary vascular congestion and mild bibasilar atelectasis. Electronically Signed   By: Margarette Canada M.D.   On: 02/26/2017 07:05     STUDIES:  CT head 5/16 > negative. CT A / P 5/16 > appendix slightly enlarged but no surrounding inflammatory changes. CXR 5/16 > cardiomegaly. Echo 5/17 >  Renal US 5/16 > Unremarkable  CULTURES: Blood 5/16 >  Sputum 5/16 > Coag neg staph on reflex panel, cultures pending >>> Urine 5/16 >   ANTIBIOTICS: Vanc 5/16 >  Zosyn 5/16 >   SIGNIFICANT EVENTS: 5/16 > admit.  LINES/TUBES: ETT 5/16 > HD cath 5/16 >    DISCUSSION: 71 y.o. female admitted 5/16 with acute renal failure, dehydration, and concern for sepsis.  Given multiple metabolic derangements, she was admitted to ICU. She was intubated for respiratory failure and ultimately required CRRT and additionally pressors for shock. She was quickly able to come off pressors, however continues to require vent and CRRT.  ASSESSMENT / PLAN:  CARDIOVASCULAR A:  Shock unclear etiology. Septic vs hypovolemic. (resolved) Troponin rise with c/o chest pain. (troponins now trending down, non-acute EKG) Hx HTN, HLD, CAD.  P:  Telemetry monitoring Off pressors Troponin now trending down, continue to trend Echo pending Ensure lactic clearing On heparin via CRRT  Holding preadmission aspirin, atenolol, atorvastatin, benazepril, furosemide, imdur, nitro.  RENAL A:   AoCKD -  presumed pre-renal from hypovolemia / dehydration, also possibly component of ATN.  Also exacerbated by ACEi + NSAIDs. Hyperkalemia (improved) AGMA - lactate + uremia + metformin(resolved) P:   Nephrology following. Correct electrolytes as indicated. BMP q12hrs.   INFECTIOUS A:   Concern for sepsis of unclear etiology.  Coag neg staph indicated on blood cx reflex panel, likely contaminant.  P:   Abx as above (vanc / zosyn).  Follow cultures as above. PCT algorithm to limit abx exposure. (1.41 > 1.99)  PULMONARY A: Uncompensated met acidosis (resolved) ARF P:   ABG improved Full vent support Tried SBT this AM  with low volumes, suspect she will fail.  Continued weaning attempts.  VAP bundle  GASTROINTESTINAL A:   Nutrition. Nausea with vomiting - CT A/P negative. P:   Continue TF Zofran PRN Protonix for SUP  HEMATOLOGIC A:   VTE Prophylaxis. Coagulopathy (aptt > 200) no signs bleeding P:  SCD's  Would favor DC heparin via CRRT CBC in AM.  ENDOCRINE A:   DM. P:   SSI and CBG monitoring Hold preadmission metformin, victoza, lantus.  NEUROLOGIC A:   Acute encephalopathy - ? Due to sepsis vs uremia vs metabolic vs CVA (though CVA less likely given non-focal exam, initial head CT neg). Improved P:   Continue supportive care as outlined above. RASS goal 0 to -1 Fentanyl in fusion, PRN versed for sedation.  Hold preadmission lyrica, paroxetine, zolpidem.  Family updated: Updated daughter Helene Kelp via telephone this AM  Interdisciplinary Family Meeting v Palliative Care Meeting:  Due by: 03/02/17  CC time: 35 min.   Georgann Housekeeper, AGACNP-BC [place] [facility] Pager [phone] or [phone]  02/26/2017 7:32 AM   STAFF NOTE: Linwood Dibbles, MD FACP have personally reviewed patient's available data, including medical history, events of note, physical examination and test results as part of my evaluation. I have discussed with  resident/NP and other care providers such as pharmacist, RN and RRT. In addition, I personally evaluated patient and elicited key findings of: awakens, rass -1, lungs clears about same, pcxr which I reviewed - shows resolving int change sleft / edema, ett wnl, abdo soft, edema min, cvvhd remains on , goal even to consider slight neg if fails weaning again, wean this am poorly cpap 5 ps5, goal 2 hours, failed, increase PS 10-12, pcxr in am ,  Sit upright as able, she has coag neg staph in [facility] 1/2 = contamination, pct not impressive with arf crt rise, consider narrow to ctx, wbc is resolved, Tf are tolerated, lantus 5 add, with ssi, hep held for ptt The patient is critically ill with multiple organ systems failure and requires high complexity decision making for assessment and support, frequent evaluation and titration of therapies, application of advanced monitoring technologies and extensive interpretation of multiple databases.   Critical Care Time devoted to patient care services described in this note is30 Minutes. This time reflects time of care of this signee: Merrie Roof, MD FACP. This critical care time does not reflect procedure time, or teaching time or supervisory time of PA/NP/Med student/Med Resident etc but could involve care discussion time. Rest per NP/medical resident whose note is outlined above and that I agree with   Lavon Paganini. Titus Mould, MD, [place] Pgr: [place] [facility] 02/26/2017 10:47 AM

## 2017-02-26 NOTE — Progress Notes (Signed)
Changed to cp/ps 20ps per Dr Titus Mould at bedside.

## 2017-02-26 NOTE — Progress Notes (Signed)
eLink Physician-Brief Progress Note Patient Name: [name] [name] DOB: April 14, 1946 MRN: [id number]   Date of Service  02/26/2017  HPI/Events of Note  Hypokalemia and hypophos  eICU Interventions  Potassium and phos replaced     Intervention Category Intermediate Interventions: Electrolyte abnormality - evaluation and management  Yaroslav Gombos 02/26/2017, 6:01 AM

## 2017-02-26 NOTE — Progress Notes (Signed)
Subjective:  Was able to come off of pressors overnight- remains on CRRT - 136 of UOP over last 24 hours and 30 so far today  Objective Vital signs in last 24 hours: Vitals:   02/26/17 0730 02/26/17 0800 02/26/17 0830 02/26/17 0856  BP: 108/65 (!) 108/50 (!) 108/56 (!) 108/56  Pulse: 70 72 72   Resp: (!) '24 20 14   ' Temp: (!) 96.6 F (35.9 C) (!) 96.8 F (36 C) (!) 96.8 F (36 C)   TempSrc:      SpO2: 100% 96% 99%   Weight:      Height:       Weight change: 2.9 kg (6 lb 6.3 oz)  Intake/Output Summary (Last 24 hours) at 02/26/17 0901 Last data filed at 02/26/17 0800  Gross per 24 hour  Intake          2465.08 ml  Output             2531 ml  Net           -65.92 ml    Assessment/ Plan: Pt is a 71 y.o. yo female with DM and HTN but creatinine of 1.1 on 12/10/16 who was admitted on 02/24/2017 with seems like sepsis syndrome with AKI- severe acidosis and hyperkalemia- home meds included ACE, NSAID and potassium  Assessment/Plan: 1. Renal- AKI in the setting of extreme hypotension on ACE/NSAID- U/A pretty bland- CT no obstruction.  Suspect ATN.  Requiring CRRT due to hemodynamic instability/acidosis and hyperkalemia- using heparin with machine- started overnight 5/16- may be approaching no longer a need for it- labs are fine today- will tell nursing to stop it if it clots off but stop in in AM regardless 2. HTN/vol- no volume removal with HD-  Think she needs volume outside the machine- per CCM, has been bolused 3. Anemia- hgb 9.3--8.8  watch for now- transfuse as needed 4. Metabolic acidosis- previously using bicarb as a replacement fluid with CRRT- no all just dialysate 5. Hyperkalemia- corrected with CRRT- pre, post and dialysate 4 K - to continue 6. Sepsis?  - on zosyn/vanc- CT negative for source- per CCM 7. Hypophos- replete  Couper [place]aj A    Labs: Basic Metabolic Panel:  Recent Labs Lab 02/25/17 1020 02/25/17 1748 02/25/17 2107 02/25/17 2309 02/26/17 0511  NA  140 139 140 137 136  K 3.3* 3.8 3.7 3.7 3.5  CL 97* 98*  --  95* 98*  CO2 19* 24  --  27 26  GLUCOSE 130* 115*  --  133* 186*  BUN 40* 28*  --  24* 20  CREATININE 4.26* 3.08*  --  2.55* 2.03*  CALCIUM 6.9* 6.9*  --  7.0* 7.2*  PHOS 3.8 2.3*  2.4*  --   --  1.5*  1.6*   Liver Function Tests:  Recent Labs Lab 02/24/17 1600 02/24/17 2318 02/25/17 0310 02/25/17 1748 02/26/17 0511  AST 26 140*  --   --   --   ALT 14 69*  --   --   --   ALKPHOS 80 64  --   --   --   BILITOT 0.5 0.9  --   --   --   PROT 7.5 5.4*  --   --   --   ALBUMIN 3.0* 2.0* 2.6* 2.3* 2.4*    Recent Labs Lab 02/24/17 1600  LIPASE 205*   No results for input(s): AMMONIA in the last 168 hours. CBC:  Recent Labs Lab 02/24/17 1600  02/25/17 0310 02/25/17 2107 02/26/17 0511  WBC 27.8*  --  30.3*  --  8.8  NEUTROABS 22.3*  --   --   --   --   HGB 10.2*  < > 9.3* 8.2* 8.8*  HCT 31.2*  < > 26.9* 24.0* 24.8*  MCV 86.2  --  82.3  --  79.5  PLT 419*  --  342  --  281  < > = values in this interval not displayed. Cardiac Enzymes:  Recent Labs Lab 02/24/17 1916  02/25/17 0310 02/25/17 1020 02/25/17 1748 02/25/17 2309 02/26/17 0511  CKTOTAL 62  --   --   --   --   --   --   TROPONINI  --   < > 0.18* 0.56* 0.86* 0.81* 0.67*  < > = values in this interval not displayed. CBG:  Recent Labs Lab 02/25/17 1558 02/25/17 2019 02/26/17 0026 02/26/17 0415 02/26/17 0822  GLUCAP 88 155* 110* 160* 214*    Iron Studies:   Recent Labs  02/24/17 2318  IRON 43  TIBC 158*   Studies/Results: Ct Abdomen Pelvis Wo Contrast  Result Date: 02/24/2017 CLINICAL DATA:  Epigastric pain EXAM: CT ABDOMEN AND PELVIS WITHOUT CONTRAST TECHNIQUE: Multidetector CT imaging of the abdomen and pelvis was performed following the standard protocol without IV contrast. COMPARISON:  None. FINDINGS: Significant respiratory motion artifact compromises the study. Lower chest: Lung bases demonstrate no acute consolidation or  pleural effusion. Borderline heart size. Minimal coronary artery calcification. Hepatobiliary: Calcified granuloma. No biliary dilatation. Status post cholecystectomy. Pancreas: Fuzzy appearance of the pancreas tail is suspected to be related to motion artifact. No ductal dilatation. Spleen: Normal in size without focal abnormality. Adrenals/Urinary Tract: Adrenal glands grossly within normal limits. No hydronephrosis. Bladder within normal limits Stomach/Bowel: Stomach nonenlarged. Small pill fragment. No dilated small bowel. No colon wall thickening. The appendix is slightly enlarged, measuring between 8 and 9 mm, no definitive surrounding inflammatory change. Vascular/Lymphatic: Aortic atherosclerosis. No enlarged abdominal or pelvic lymph nodes. Reproductive: Uterus and bilateral adnexa are unremarkable. Other: No free air or free fluid. Musculoskeletal: Posterior stabilization rods and fixating screws at L2-L3 with laminectomy changes from L2 through L4. Interbody disc devices at L2-L3, L3-L4 and L4-L5. IMPRESSION: 1. The examination is compromised by respiratory motion artifact. 2. The appendix is slightly enlarged at 8 to 9 mm but no surrounding inflammatory changes. Electronically Signed   By: Donavan Foil M.D.   On: 02/24/2017 18:15   Dg Chest 2 View  Result Date: 02/24/2017 CLINICAL DATA:  Chest pain, slurred speech and fatigue. EXAM: CHEST  2 VIEW COMPARISON:  12/21/2013 and prior radiographs FINDINGS: Cardiomegaly and pulmonary vascular congestion noted. Elevation of the right hemidiaphragm again identified. There is no evidence of focal airspace disease, pulmonary edema, suspicious pulmonary nodule/mass, pleural effusion, or pneumothorax. No acute bony abnormalities are identified. IMPRESSION: Cardiomegaly without evidence of acute cardiopulmonary disease. Electronically Signed   By: Margarette Canada M.D.   On: 02/24/2017 16:27   Ct Head Wo Contrast  Result Date: 02/24/2017 CLINICAL DATA:   Left-sided weakness. EXAM: CT HEAD WITHOUT CONTRAST TECHNIQUE: Contiguous axial images were obtained from the base of the skull through the vertex without intravenous contrast. COMPARISON:  None. FINDINGS: Brain: There is no evidence of acute infarct, intracranial hemorrhage, mass, midline shift, or extra-axial fluid collection. The ventricles and sulci are normal. Incidental dural calcification along the sphenoid wings. Vascular: Calcified atherosclerosis at the skullbase. No hyperdense vessel. Skull: No fracture or focal osseous  lesion. Sinuses/Orbits: Visualized paranasal sinuses and mastoid air cells are clear. Bilateral cataract extraction is noted. Other: None. IMPRESSION: Unremarkable CT appearance of the brain. Electronically Signed   By: Logan Bores M.D.   On: 02/24/2017 16:45   US Renal  Result Date: 02/25/2017 CLINICAL DATA:  Acute kidney injury. EXAM: RENAL / URINARY TRACT ULTRASOUND COMPLETE COMPARISON:  CT 02/24/2017 FINDINGS: Right Kidney: Length: 10.8 cm. Echogenicity within normal limits. No mass or hydronephrosis visualized. Left Kidney: Length: 10.9 cm. Echogenicity within normal limits. No mass or hydronephrosis visualized. Bladder: Decompressed by Foley catheter and not well evaluated. IMPRESSION: Unremarkable renal ultrasound.  No obstructive uropathy. Urinary bladder decompressed by Foley catheter and not evaluated. Electronically Signed   By: Jeb Levering M.D.   On: 02/25/2017 01:59   Dg Chest Port 1 View  Result Date: 02/26/2017 CLINICAL DATA:  Respiratory failure. EXAM: PORTABLE CHEST 1 VIEW COMPARISON:  02/25/2017 FINDINGS: An endotracheal tube 2.1 cm above the carina, right IJ central venous catheter with tip overlying the mid SVC, and NG tube entering the stomach again identified. This is a mildly low volume film. Cardiomegaly, mild pulmonary vascular congestion and mild bibasilar atelectasis again noted. There is no evidence of pneumothorax or pleural effusion. IMPRESSION:  Little significant change since the prior study with cardiomegaly, mild pulmonary vascular congestion and mild bibasilar atelectasis. Electronically Signed   By: Margarette Canada M.D.   On: 02/26/2017 07:05   Dg Chest Port 1 View  Result Date: 02/25/2017 CLINICAL DATA:  Respiratory failure. EXAM: PORTABLE CHEST 1 VIEW COMPARISON:  02/24/2017. FINDINGS: Patient rotated to the right. Endotracheal tube, NG tube, right IJ line in stable position. Cardiomegaly. Interim slight improvement of pulmonary vascular congestion and interstitial prominence. Small left pleural effusion. No pneumothorax. Postsurgical changes both shoulders. IMPRESSION: 1. Lines and tubes in stable position. 2. Cardiomegaly with interim slight improvement pulmonary vascular congestion and pulmonary interstitial edema. Small left pleural effusion. Electronically Signed   By: Marcello Moores  Register   On: 02/25/2017 06:26   Dg Chest Port 1 View  Result Date: 02/24/2017 CLINICAL DATA:  ET tube, OG and line placement EXAM: PORTABLE CHEST 1 VIEW COMPARISON:  02/24/2017 FINDINGS: Insertion of right-sided central venous catheter, the tip projects over distal SVC. No right pneumothorax. Interval intubation, tip of the endotracheal tube is 1.8 cm superior to the carina. Esophageal tube tip is below the diaphragm. Possible ascending catheter with the tip positioned over the right atrial IVC junction. Stable cardiomegaly. Development of central vascular congestion and hazy asymmetric edema or infiltrate in the left chest. IMPRESSION: 1. Placement of support lines and tubes as above. Endotracheal tube tip approximately 1.8 cm superior to carina 2. Cardiomegaly. Development of central vascular congestion and asymmetric hazy opacity in the left thorax which may reflect asymmetric edema or infiltrate. Electronically Signed   By: Donavan Foil M.D.   On: 02/24/2017 23:38   Dg Abd Portable 1v  Result Date: 02/24/2017 CLINICAL DATA:  OG placement EXAM: PORTABLE  ABDOMEN - 1 VIEW COMPARISON:  CT 02/24/2017 FINDINGS: Numerous support leads limit the exam. Esophageal tube tip overlies proximal stomach, side-port probably in the region of GE junction. Relatively gasless abdomen. Surgical hardware in the spine. IMPRESSION: Esophageal tube tip overlies proximal stomach, side-port probably at the GE junction, further advancement is suggested for more optimal positioning Electronically Signed   By: Donavan Foil M.D.   On: 02/24/2017 23:30   Medications: Infusions: . sodium chloride 250 mL (02/26/17 0400)  . fentaNYL infusion INTRAVENOUS  150 mcg/hr (02/26/17 0400)  . heparin 10,000 units/ 20 mL infusion syringe 750 Units/hr (02/26/17 0814)  . norepinephrine (LEVOPHED) Adult infusion Stopped (02/25/17 0800)  . piperacillin-tazobactam (ZOSYN)  IV Stopped (02/26/17 0556)  . potassium phosphate IVPB (mmol) 30 mmol (02/26/17 0631)  . dialysis replacement fluid (prismasate) 800 mL/hr at 02/26/17 0826  . dialysis replacement fluid (prismasate) 250 mL/hr at 02/26/17 0001  . dialysate (PRISMASATE) 2,000 mL/hr at 02/26/17 0634  . sodium chloride 1,000 mL (02/24/17 2348)  . vancomycin Stopped (02/25/17 1954)  . vasopressin (PITRESSIN) infusion - *FOR SHOCK* Stopped (02/25/17 1000)    Scheduled Medications: . aspirin  81 mg Per Tube Daily  . chlorhexidine gluconate (MEDLINE KIT)  15 mL Mouth Rinse BID  . feeding supplement (VITAL HIGH PROTEIN)  1,000 mL Per Tube Q24H  . fentaNYL (SUBLIMAZE) injection  50 mcg Intravenous Once  . heparin  5,000 Units Subcutaneous Q8H  . insulin aspart  0-15 Units Subcutaneous Q4H  . mouth rinse  15 mL Mouth Rinse QID  . metoprolol tartrate  2.5 mg Intravenous Q6H  . pantoprazole (PROTONIX) IV  40 mg Intravenous Daily    have reviewed scheduled and prn medications.  Physical Exam: General: alert following commands !  Heart: RRR Lungs: CBS bilat  Abdomen: soft, non tender Extremities: no peripheral edema Dialysis Access: right  IJ HD cath placed 5/16     02/26/2017,9:01 AM  LOS: 2 days

## 2017-02-26 NOTE — Progress Notes (Signed)
  Echocardiogram 2D Echocardiogram has been performed.  [name] [name] 02/26/2017, 8:47 AM

## 2017-02-26 NOTE — Care Management Note (Signed)
Case Management Note  Patient Details  Name: [name] [name] MRN: [id number] Date of Birth: [birth year]  Subjective/Objective:        Pt admitted with AMS             Action/Plan:  Pt is intubated and on CRRT   Expected Discharge Date:                  Expected Discharge Plan:     In-House Referral:     Discharge planning Services  CM Consult  Post Acute Care Choice:    Choice offered to:     DME Arranged:    DME Agency:     HH Arranged:    HH Agency:     Status of Service:     If discussed at [employer], dates discussed:    Additional Comments:  Maryclare Labrador, RN 02/26/2017, 11:49 AM

## 2017-02-27 ENCOUNTER — Inpatient Hospital Stay (HOSPITAL_COMMUNITY): Payer: Medicare Other

## 2017-02-27 DIAGNOSIS — J9601 Acute respiratory failure with hypoxia: Secondary | ICD-10-CM

## 2017-02-27 LAB — MRSA CULTURE: Culture: NOT DETECTED

## 2017-02-27 LAB — CBC
HCT: 23 % — ABNORMAL LOW (ref 36.0–46.0)
Hemoglobin: 8 g/dL — ABNORMAL LOW (ref 12.0–15.0)
MCH: 28 pg (ref 26.0–34.0)
MCHC: 34.8 g/dL (ref 30.0–36.0)
MCV: 80.4 fL (ref 78.0–100.0)
Platelets: 265 10*3/uL (ref 150–400)
RBC: 2.86 MIL/uL — ABNORMAL LOW (ref 3.87–5.11)
RDW: 14.4 % (ref 11.5–15.5)
WBC: 9.8 10*3/uL (ref 4.0–10.5)

## 2017-02-27 LAB — GLUCOSE, CAPILLARY
Glucose-Capillary: 141 mg/dL — ABNORMAL HIGH (ref 65–99)
Glucose-Capillary: 190 mg/dL — ABNORMAL HIGH (ref 65–99)
Glucose-Capillary: 223 mg/dL — ABNORMAL HIGH (ref 65–99)
Glucose-Capillary: 231 mg/dL — ABNORMAL HIGH (ref 65–99)
Glucose-Capillary: 249 mg/dL — ABNORMAL HIGH (ref 65–99)
Glucose-Capillary: 285 mg/dL — ABNORMAL HIGH (ref 65–99)

## 2017-02-27 LAB — RENAL FUNCTION PANEL
Albumin: 2.2 g/dL — ABNORMAL LOW (ref 3.5–5.0)
Albumin: 2.1 g/dL — ABNORMAL LOW (ref 3.5–5.0)
Anion gap: 9 (ref 5–15)
Anion gap: 8 (ref 5–15)
BUN: 12 mg/dL (ref 6–20)
BUN: 19 mg/dL (ref 6–20)
CO2: 27 mmol/L (ref 22–32)
CO2: 27 mmol/L (ref 22–32)
Calcium: 7.4 mg/dL — ABNORMAL LOW (ref 8.9–10.3)
Calcium: 7.4 mg/dL — ABNORMAL LOW (ref 8.9–10.3)
Chloride: 101 mmol/L (ref 101–111)
Chloride: 102 mmol/L (ref 101–111)
Creatinine, Ser: 1.26 mg/dL — ABNORMAL HIGH (ref 0.44–1.00)
Creatinine, Ser: 2.09 mg/dL — ABNORMAL HIGH (ref 0.44–1.00)
GFR calc Af Amer: 48 mL/min — ABNORMAL LOW (ref >60)
GFR calc Af Amer: 26 mL/min — ABNORMAL LOW (ref >60)
GFR calc non Af Amer: 42 mL/min — ABNORMAL LOW (ref >60)
GFR calc non Af Amer: 23 mL/min — ABNORMAL LOW (ref >60)
Glucose, Bld: 222 mg/dL — ABNORMAL HIGH (ref 65–99)
Glucose, Bld: 225 mg/dL — ABNORMAL HIGH (ref 65–99)
Phosphorus: 1.4 mg/dL — ABNORMAL LOW (ref 2.5–4.6)
Phosphorus: 4.9 mg/dL — ABNORMAL HIGH (ref 2.5–4.6)
Potassium: 3.9 mmol/L (ref 3.5–5.1)
Potassium: 3.5 mmol/L (ref 3.5–5.1)
Sodium: 137 mmol/L (ref 135–145)
Sodium: 137 mmol/L (ref 135–145)

## 2017-02-27 LAB — APTT: aPTT: 43 seconds — ABNORMAL HIGH (ref 24–36)

## 2017-02-27 LAB — CULTURE, BLOOD (ROUTINE X 2): Special Requests: ADEQUATE

## 2017-02-27 LAB — MAGNESIUM: Magnesium: 2.2 mg/dL (ref 1.7–2.4)

## 2017-02-27 MED ORDER — SODIUM PHOSPHATES 45 MMOLE/15ML IV SOLN
30.0000 mmol | Freq: Once | INTRAVENOUS | Status: AC
  Administered 2017-02-27: 30 mmol via INTRAVENOUS
  Filled 2017-02-27: qty 10

## 2017-02-27 NOTE — Procedures (Signed)
Extubation Procedure Note  Patient Details:   Name: [name] [name] DOB: 1946-01-13 MRN: [id number]   Airway Documentation:     Evaluation  O2 sats: stable throughout Complications: No apparent complications Patient did tolerate procedure well. Bilateral Breath Sounds: Other (Comment) (coarse)   Yes   Pt extubated per MD order.  Pt tolerated well, Pt placed on 4L [place] with sats 100%. RT will continue to monitor.  Pierre Bali 02/27/2017, 4:21 PM

## 2017-02-27 NOTE — Progress Notes (Signed)
WASTED DOWN SINK 75 ML OF 2500/250 ML FENTANYL. Dorthy Monroe winessed

## 2017-02-27 NOTE — Progress Notes (Signed)
Critical potassium value of 6.1 given to Dr Vaughan Browner. Lab tech noted that the sample was hemolyzed. Ordewr given to redraw with peripheral blood stick

## 2017-02-27 NOTE — Progress Notes (Signed)
Subjective:  Couple of low BPs overnight- brief pressors but now off again - CRRT stopped this AM Objective Vital signs in last 24 hours: Vitals:   02/27/17 0400 02/27/17 0500 02/27/17 0600 02/27/17 0700  BP: 130/60 (!) 115/57 122/63 (!) 110/50  Pulse: 86 83 87 87  Resp: (!) 27 (!) 24 (!) 22 (!) 21  Temp: 97.3 F (36.3 C) 97.7 F (36.5 C) 97.9 F (36.6 C) 98.8 F (37.1 C)  TempSrc: Core (Comment)     SpO2: 99% 100% 100% 100%  Weight:      Height:       Weight change: -0.6 kg (-1 lb 5.2 oz)  Intake/Output Summary (Last 24 hours) at 02/27/17 0741 Last data filed at 02/27/17 0700  Gross per 24 hour  Intake          2374.35 ml  Output             3000 ml  Net          -625.65 ml    Assessment/ Plan: Pt is a 71 y.o. yo female with DM and HTN but creatinine of 1.1 on 12/10/16 who was admitted on 02/24/2017 with seems like sepsis syndrome with AKI- severe acidosis and hyperkalemia- home meds included ACE, NSAID and potassium  Assessment/Plan: 1. Renal- AKI in the setting of extreme hypotension on ACE/NSAID- U/A pretty bland- CT no obstruction.  Suspect ATN.  Requiring CRRT due to hemodynamic instability/acidosis and hyperkalemia- using heparin with machine- started overnight 5/16---- stopped 5/19 AM.  Will continue to observe- if needs RRT again could likely do IHD 2. HTN/vol- no volume removal with HD-  Think she needs volume outside the machine- per CCM, has been bolused 3. Anemia- hgb 9.3--8.8--8.0  watch for now- transfuse as needed 4. Metabolic acidosis- previously using bicarb as a replacement fluid with CRRT- much improved 5. Hyperkalemia- corrected with CRRT- pre, post and dialysate 4 K - to continue 6. Sepsis?  - on zosyn/vanc- CT negative for source- per CCM 7. Hypophos- replete again this AM  [name] [name] A    Labs: Basic Metabolic Panel:  Recent Labs Lab 02/26/17 0511 02/26/17 1005 02/26/17 1548 02/27/17 0323  NA 136 138 137 137  K 3.5 3.9 4.2 3.9  CL 98*  101 102 101  CO2 _0 GLUCOSE 186* 174* 206* 222*  BUN _1 CREATININE 2.03* 1.70* 1.42* 1.26*  CALCIUM 7.2* 7.2* 7.2* 7.4*  PHOS 1.5*  1.6*  --  2.6  2.7 1.4*   Liver Function Tests:  Recent Labs Lab 02/24/17 1600 02/24/17 2318  02/26/17 0511 02/26/17 1548 02/27/17 0323  AST 26 140*  --   --   --   --   ALT 14 69*  --   --   --   --   ALKPHOS 80 64  --   --   --   --   BILITOT 0.5 0.9  --   --   --   --   PROT 7.5 5.4*  --   --   --   --   ALBUMIN 3.0* 2.0*  < > 2.4* 2.4* 2.2*  < > = values in this interval not displayed.  Recent Labs Lab 02/24/17 1600  LIPASE 205*   No results for input(s): AMMONIA in the last 168 hours. CBC:  Recent Labs Lab 02/24/17 1600  02/25/17 0310 02/25/17 2107 02/26/17 0511 02/27/17 0323  WBC 27.8*  --  30.3*  --  8.8 9.8  NEUTROABS 22.3*  --   --   --   --   --   HGB 10.2*  < > 9.3* 8.2* 8.8* 8.0*  HCT 31.2*  < > 26.9* 24.0* 24.8* 23.0*  MCV 86.2  --  82.3  --  79.5 80.4  PLT 419*  --  342  --  281 265  < > = values in this interval not displayed. Cardiac Enzymes:  Recent Labs Lab 02/24/17 1916  02/25/17 1748 02/25/17 2309 02/26/17 0511 02/26/17 1005 02/26/17 1548  CKTOTAL 62  --   --   --   --   --   --   TROPONINI  --   < > 0.86* 0.81* 0.67* 0.57* 0.43*  < > = values in this interval not displayed. CBG:  Recent Labs Lab 02/26/17 1156 02/26/17 1515 02/26/17 2017 02/27/17 0036 02/27/17 0331  GLUCAP 176* 178* 211* 190* 231*    Iron Studies:   Recent Labs  02/24/17 2318  IRON 43  TIBC 158*   Studies/Results: Dg Chest Port 1 View  Result Date: 02/26/2017 CLINICAL DATA:  Respiratory failure. EXAM: PORTABLE CHEST 1 VIEW COMPARISON:  02/25/2017 FINDINGS: An endotracheal tube 2.1 cm above the carina, right IJ central venous catheter with tip overlying the mid SVC, and NG tube entering the stomach again identified. This is a mildly low volume film. Cardiomegaly, mild pulmonary vascular congestion  and mild bibasilar atelectasis again noted. There is no evidence of pneumothorax or pleural effusion. IMPRESSION: Little significant change since the prior study with cardiomegaly, mild pulmonary vascular congestion and mild bibasilar atelectasis. Electronically Signed   By: Margarette Canada M.D.   On: 02/26/2017 07:05   Medications: Infusions: . sodium chloride 250 mL (02/27/17 0400)  . cefTRIAXone (ROCEPHIN)  IV Stopped (02/26/17 1148)  . fentaNYL infusion INTRAVENOUS 100 mcg/hr (02/27/17 0545)  . heparin 10,000 units/ 20 mL infusion syringe Stopped (02/26/17 0900)  . norepinephrine (LEVOPHED) Adult infusion Stopped (02/26/17 2334)  . dialysis replacement fluid (prismasate) 800 mL/hr at 02/27/17 0341  . dialysis replacement fluid (prismasate) 250 mL/hr at 02/26/17 2024  . dialysate (PRISMASATE) 2,000 mL/hr at 02/27/17 0305  . sodium chloride 1,000 mL (02/24/17 2348)  . sodium phosphate  Dextrose 5% IVPB 30 mmol (02/27/17 0640)  . vasopressin (PITRESSIN) infusion - *FOR SHOCK* Stopped (02/25/17 1000)    Scheduled Medications: . aspirin  81 mg Per Tube Daily  . chlorhexidine gluconate (MEDLINE KIT)  15 mL Mouth Rinse BID  . feeding supplement (VITAL HIGH PROTEIN)  1,000 mL Per Tube Q24H  . fentaNYL (SUBLIMAZE) injection  50 mcg Intravenous Once  . heparin  5,000 Units Subcutaneous Q8H  . insulin aspart  0-15 Units Subcutaneous Q4H  . insulin glargine  5 Units Subcutaneous Daily  . mouth rinse  15 mL Mouth Rinse QID  . metoprolol tartrate  2.5 mg Intravenous Q6H  . pantoprazole (PROTONIX) IV  40 mg Intravenous Daily    have reviewed scheduled and prn medications.  Physical Exam: General: alert following commands ! On vent Heart: RRR Lungs: CBS bilat  Abdomen: soft, non tender Extremities: no peripheral edema Dialysis Access: right IJ HD cath placed 5/16     02/27/2017,7:41 AM  LOS: 3 days

## 2017-02-27 NOTE — Progress Notes (Signed)
PULMONARY / CRITICAL CARE MEDICINE   Name: CYTHINA MICKELSEN MRN: [id number] DOB: 05-Feb-1946    ADMISSION DATE:  02/24/2017 CONSULTATION DATE:  02/24/17  REFERRING MD:  Billy Fischer  CHIEF COMPLAINT:  AMS  HISTORY OF PRESENT ILLNESS:  Pt is encephelopathic; therefore, this HPI is obtained from chart review. MARKISHA MEDING is a 71 y.o. female with PMH as outlined below. She was brought to [facility] ED 5/16 after daughter thought she was possible having a stroke due to slurred speech and inability to walk.  She also had been complaining of vague abdominal pain, 3 days of nonbloody nonbilious vomiting, 3 days of nonbloody diarrhea, and generalized weakness with worsening fatigue. In ED, she was found to be altered and had multiple metabolic derangements including acute renal failure with SCr of 10.48, hyperkalemia with K 6, AGMA, lactate 10.79.  CT of the head, abdomen / pelvis were all normal. Given her metabolic derangements and concern for deterioration with need for intubation, PCCM was asked to admit to ICU.  SUBJECTIVE/INTERVAL HISTORY: Off pressors today.  On PSV weans Off CRRT  VITAL SIGNS: BP (!) 119/52   Pulse 94   Temp 99.9 F (37.7 C)   Resp 18   Ht '5\' 3"'  (1.6 m)   Wt 223 lb 8.7 oz (101.4 kg)   SpO2 99%   BMI 39.60 kg/m   HEMODYNAMICS:    VENTILATOR SETTINGS: Vent Mode: CPAP;PSV FiO2 (%):  [30 %] 30 % Set Rate:  [20 bmp] 20 bmp Vt Set:  [460 mL] 460 mL PEEP:  [5 cmH20] 5 cmH20 Pressure Support:  [16 cmH20-20 cmH20] 16 cmH20 Plateau Pressure:  [20 cmH20-27 cmH20] 20 cmH20  INTAKE / OUTPUT: I/O last 3 completed shifts: In: 3744.4 [I.V.:745.4; Other:60; NG/GT:2080; IV Piggyback:859.1] Out: 6579 [UXYBF:383; ANVBT:6606; Stool:1010]   PHYSICAL EXAMINATION:  Gen:      No acute distress, obese HEENT:  EOMI, sclera anicteric Neck:     No masses; no thyromegaly Lungs:    Clear to auscultation bilaterally; normal respiratory effort CV:         Regular rate and rhythm; no  murmurs Abd:      + bowel sounds; soft, non-tender; no palpable masses, no distension Ext:    No edema; adequate peripheral perfusion Skin:      Warm and dry; no rash Neuro: Awake, responsive   LABS:  BMET  Recent Labs Lab 02/26/17 1005 02/26/17 1548 02/27/17 0323  NA 138 137 137  K 3.9 4.2 3.9  CL 101 102 101  CO2 '27 26 27  ' BUN '14 12 12  ' CREATININE 1.70* 1.42* 1.26*  GLUCOSE 174* 206* 222*    Electrolytes  Recent Labs Lab 02/26/17 0511 02/26/17 1005 02/26/17 1548 02/27/17 0323  CALCIUM 7.2* 7.2* 7.2* 7.4*  MG 2.1  --  2.2 2.2  PHOS 1.5*  1.6*  --  2.6  2.7 1.4*    CBC  Recent Labs Lab 02/25/17 0310 02/25/17 2107 02/26/17 0511 02/27/17 0323  WBC 30.3*  --  8.8 9.8  HGB 9.3* 8.2* 8.8* 8.0*  HCT 26.9* 24.0* 24.8* 23.0*  PLT 342  --  281 265    Coag's  Recent Labs Lab 02/24/17 1600 02/25/17 0310 02/26/17 0511 02/27/17 0323  APTT  --  160* >200* 43*  INR 1.36  --   --   --     Sepsis Markers  Recent Labs Lab 02/24/17 2318 02/25/17 0050 02/25/17 0310 02/25/17 0744 02/26/17 0511 02/26/17 0810  LATICACIDVEN 10.1* 9.0*  --  8.1*  --  2.1*  PROCALCITON 1.41  --  1.41  --  1.99  --     ABG  Recent Labs Lab 02/25/17 1222 02/25/17 1417 02/26/17 1514  PHART 7.506* 7.478* 7.301*  PCO2ART 23.2* 28.2* 54.4*  PO2ART 139.0* 92.0 76.0*    Liver Enzymes  Recent Labs Lab 02/24/17 1600 02/24/17 2318  02/26/17 0511 02/26/17 1548 02/27/17 0323  AST 26 140*  --   --   --   --   ALT 14 69*  --   --   --   --   ALKPHOS 80 64  --   --   --   --   BILITOT 0.5 0.9  --   --   --   --   ALBUMIN 3.0* 2.0*  < > 2.4* 2.4* 2.2*  < > = values in this interval not displayed.  Cardiac Enzymes  Recent Labs Lab 02/26/17 0511 02/26/17 1005 02/26/17 1548  TROPONINI 0.67* 0.57* 0.43*    Glucose  Recent Labs Lab 02/26/17 1156 02/26/17 1515 02/26/17 2017 02/27/17 0036 02/27/17 0331 02/27/17 0854  GLUCAP 176* 178* 211* 190* 231* 285*     Imaging Dg Chest Port 1 View  Result Date: 02/27/2017 CLINICAL DATA:  Endotracheal tube in place. EXAM: PORTABLE CHEST 1 VIEW COMPARISON:  One-view chest x-ray 02/26/2017 FINDINGS: The heart size is exaggerated by low lung volumes. Endotracheal tube remains low, 1.8 cm above the carina. The side port of the NG tube tip is above the GE junction. A right IJ line terminates in the SVC. Moderate pulmonary vascular congestion is present. Lung volumes have decreased slightly. Bibasilar airspace disease likely reflects atelectasis. IMPRESSION: 1. Endotracheal tube remains low, 1.8 cm above the carina. 2. Support apparatus is otherwise stable. The side port of the NG tube is above the GE junction. 3. Slight decrease in lung volumes with residual bibasilar atelectasis. Electronically Signed   By: San Morelle M.D.   On: 02/27/2017 07:40    STUDIES:  CT head 5/16 > negative. CT A / P 5/16 > appendix slightly enlarged but no surrounding inflammatory changes. CXR 5/16 > cardiomegaly. Echo 5/17 > Vigorous LV systolic function; mild diastolic dysfunction;   sclerotic aortic valve with mild AI; mild MR; mild TR with mildly   elevated pulmonary pressure. Renal US 5/16 > Unremarkable  CULTURES: Blood 5/16 > Coag neg staph on reflex panel, cultures pending >>> Sputum 5/16 > Staph aureus Urine 5/16 >   ANTIBIOTICS: Vanc 5/16 >  Zosyn 5/16 >   SIGNIFICANT EVENTS: 5/16 > admit.  LINES/TUBES: ETT 5/16 > HD cath 5/16 >    DISCUSSION: 71 y.o. female admitted 5/16 with acute renal failure, dehydration, and concern for sepsis.  Given multiple metabolic derangements, she was admitted to ICU. She was intubated for respiratory failure and ultimately required CRRT and additionally pressors for shock. She was quickly able to come off pressors, however continues to require vent and CRRT.  ASSESSMENT / PLAN:  CARDIOVASCULAR A:  Shock unclear etiology. Septic vs hypovolemic. (resolved) Troponin  rise with c/o chest pain. (troponins now trending down, non-acute EKG) Hx HTN, HLD, CAD.  P:  Continue tele monitoring Holding pre admission aspirin, atenolol, atorvastatin, benazepril, furosemide, imdur, nitro.   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  Also exacerbated by ACEi + NSAIDs. Hyperkalemia (improved) AGMA - lactate + uremia + metformin(resolved) P:   Nephrology following. Off CRRT. May be  able to transition to HD  INFECTIOUS A:   Concern for sepsis of unclear etiology.  Coag neg staph indicated on blood cx reflex panel, likely contaminant.  Staph aureus in sputum P:   Abx narrowed to cefriaxone Follow cultures  PULMONARY A: Uncompensated met acidosis (resolved) ARF P:   SBTs as toelrated  GASTROINTESTINAL A:   Nutrition. Nausea with vomiting - CT A/P negative. P:   Continue TF Zofran PRN Protonix for SUP  HEMATOLOGIC A:   VTE Prophylaxis. Coagulopathy (aptt > 200) no signs bleeding P:  SCD's  CBC in AM.  ENDOCRINE A:   DM. P:   SSI, CBG monitoring Hold preadmission metformin, victoza, lantus.  NEUROLOGIC A:   Acute encephalopathy - ? Due to sepsis vs uremia vs metabolic vs CVA (though CVA less likely given non-focal exam, initial head CT neg). Improved P:   Continue supportive care as outlined above. RASS goal 0 to -1 Wean down sedation Hold preadmission lyrica, paroxetine, zolpidem.  Family updated: Updated daughter Helene Kelp via telephone 5/18  Interdisciplinary Family Meeting v Palliative Care Meeting:  Due by: 03/02/17  The patient is critically ill with multiple organ system failure and requires high complexity decision making for assessment and support, frequent evaluation and titration of therapies, advanced monitoring, review of radiographic studies and interpretation of complex data.   Critical Care Time devoted to patient care services, exclusive of separately  billable procedures, described in this note is 35 minutes.   Marshell Garfinkel MD [facility] Pager [phone] If no answer or after 3pm call: [phone] 02/27/2017, 11:23 AM

## 2017-02-27 NOTE — Progress Notes (Signed)
[facility] Progress Note Patient Name: [name] [name] DOB: 10-10-1946 MRN: [id number]   Date of Service  02/27/2017  HPI/Events of Note  Hypophos  eICU Interventions  Phos replaced     Intervention Category Intermediate Interventions: Electrolyte abnormality - evaluation and management  Kellyn Mansfield 02/27/2017, 6:20 AM

## 2017-02-28 LAB — GLUCOSE, CAPILLARY
Glucose-Capillary: 110 mg/dL — ABNORMAL HIGH (ref 65–99)
Glucose-Capillary: 125 mg/dL — ABNORMAL HIGH (ref 65–99)
Glucose-Capillary: 194 mg/dL — ABNORMAL HIGH (ref 65–99)
Glucose-Capillary: 211 mg/dL — ABNORMAL HIGH (ref 65–99)
Glucose-Capillary: 246 mg/dL — ABNORMAL HIGH (ref 65–99)
Glucose-Capillary: 398 mg/dL — ABNORMAL HIGH (ref 65–99)

## 2017-02-28 LAB — RENAL FUNCTION PANEL
Albumin: 2 g/dL — ABNORMAL LOW (ref 3.5–5.0)
Anion gap: 9 (ref 5–15)
BUN: 21 mg/dL — ABNORMAL HIGH (ref 6–20)
CO2: 28 mmol/L (ref 22–32)
Calcium: 7.7 mg/dL — ABNORMAL LOW (ref 8.9–10.3)
Chloride: 102 mmol/L (ref 101–111)
Creatinine, Ser: 2.87 mg/dL — ABNORMAL HIGH (ref 0.44–1.00)
GFR calc Af Amer: 18 mL/min — ABNORMAL LOW (ref >60)
GFR calc non Af Amer: 15 mL/min — ABNORMAL LOW (ref >60)
Glucose, Bld: 118 mg/dL — ABNORMAL HIGH (ref 65–99)
Phosphorus: 5.7 mg/dL — ABNORMAL HIGH (ref 2.5–4.6)
Potassium: 3.4 mmol/L — ABNORMAL LOW (ref 3.5–5.1)
Sodium: 139 mmol/L (ref 135–145)

## 2017-02-28 LAB — MAGNESIUM: Magnesium: 2.2 mg/dL (ref 1.7–2.4)

## 2017-02-28 LAB — CULTURE, RESPIRATORY W GRAM STAIN

## 2017-02-28 LAB — CULTURE, RESPIRATORY

## 2017-02-28 LAB — APTT: aPTT: 41 seconds — ABNORMAL HIGH (ref 24–36)

## 2017-02-28 MED ORDER — ATORVASTATIN CALCIUM 10 MG PO TABS
10.0000 mg | ORAL_TABLET | Freq: Every day | ORAL | Status: DC
  Administered 2017-02-28 – 2017-03-04 (×5): 10 mg via ORAL
  Filled 2017-02-28 (×5): qty 1

## 2017-02-28 MED ORDER — POTASSIUM CHLORIDE CRYS ER 20 MEQ PO TBCR
40.0000 meq | EXTENDED_RELEASE_TABLET | Freq: Once | ORAL | Status: AC
  Administered 2017-02-28: 40 meq via ORAL
  Filled 2017-02-28: qty 2

## 2017-02-28 MED ORDER — ISOSORBIDE MONONITRATE ER 30 MG PO TB24
30.0000 mg | ORAL_TABLET | Freq: Every day | ORAL | Status: DC
  Administered 2017-02-28 – 2017-03-03 (×4): 30 mg via ORAL
  Filled 2017-02-28 (×5): qty 1

## 2017-02-28 MED ORDER — SULFAMETHOXAZOLE-TRIMETHOPRIM 400-80 MG/5ML IV SOLN
500.0000 mg | Freq: Two times a day (BID) | INTRAVENOUS | Status: DC
  Administered 2017-02-28 (×2): 500 mg via INTRAVENOUS
  Filled 2017-02-28 (×3): qty 31.3

## 2017-02-28 MED ORDER — FAMOTIDINE 20 MG PO TABS
20.0000 mg | ORAL_TABLET | Freq: Every day | ORAL | Status: DC
  Administered 2017-03-01: 20 mg via ORAL
  Filled 2017-02-28: qty 1

## 2017-02-28 MED ORDER — ATENOLOL 25 MG PO TABS
25.0000 mg | ORAL_TABLET | Freq: Every day | ORAL | Status: DC
  Administered 2017-02-28 – 2017-03-04 (×5): 25 mg via ORAL
  Filled 2017-02-28 (×5): qty 1

## 2017-02-28 MED ORDER — INSULIN ASPART 100 UNIT/ML ~~LOC~~ SOLN
0.0000 [IU] | Freq: Three times a day (TID) | SUBCUTANEOUS | Status: DC
  Administered 2017-02-28: 5 [IU] via SUBCUTANEOUS
  Administered 2017-02-28: 3 [IU] via SUBCUTANEOUS
  Administered 2017-02-28: 15 [IU] via SUBCUTANEOUS
  Administered 2017-03-01: 8 [IU] via SUBCUTANEOUS
  Administered 2017-03-01 (×2): 3 [IU] via SUBCUTANEOUS
  Administered 2017-03-01: 2 [IU] via SUBCUTANEOUS
  Administered 2017-03-02: 3 [IU] via SUBCUTANEOUS
  Administered 2017-03-02 (×2): 5 [IU] via SUBCUTANEOUS
  Administered 2017-03-03: 8 [IU] via SUBCUTANEOUS

## 2017-02-28 MED ORDER — ORAL CARE MOUTH RINSE
15.0000 mL | Freq: Two times a day (BID) | OROMUCOSAL | Status: DC
  Administered 2017-02-28 – 2017-03-03 (×7): 15 mL via OROMUCOSAL

## 2017-02-28 MED FILL — Medication: Qty: 1 | Status: AC

## 2017-02-28 NOTE — Progress Notes (Signed)
Subjective:  Extubated- making some urine 700 ccs, hemodynamically stable Objective Vital signs in last 24 hours: Vitals:   02/28/17 0500 02/28/17 0523 02/28/17 0600 02/28/17 0700  BP: (!) 106/39  (!) 118/45 (!) 122/48  Pulse: 89 91 91 95  Resp: 11 12 14 13  Temp: 99.9 F (37.7 C) 99.7 F (37.6 C) 99.7 F (37.6 C) 99.9 F (37.7 C)  TempSrc:      SpO2: 100% 100% 99% 98%  Weight:      Height:       Weight change: -1.5 kg (-3 lb 4.9 oz)  Intake/Output Summary (Last 24 hours) at 02/28/17 0728 Last data filed at 02/28/17 0700  Gross per 24 hour  Intake          1181.33 ml  Output              705 ml  Net           476.33 ml    Assessment/ Plan: Pt is a 70 y.o. yo female with DM and HTN but creatinine of 1.1 on 12/10/16 who was admitted on 02/24/2017 with seems like sepsis syndrome with AKI- severe acidosis and hyperkalemia- home meds included ACE, NSAID and potassium  Assessment/Plan: 1. Renal- AKI in the setting of extreme hypotension on ACE/NSAID- U/A pretty bland- CT no obstruction.  Suspect ATN.  Required CRRT due to hemodynamic instability/acidosis and hyperkalemia- using heparin with - started overnight 5/16---- stopped 5/19 AM.  Will continue to observe- if needs RRT again could likely do IHD.  No absolute needs today- continue to watch daily  2. HTN/vol- - seems euvolemic to a little heavy- hope UOP will pick up more today  3. Anemia- hgb 9.3--8.8--8.0  watch for now- transfuse as needed 4. Metabolic acidosis- previously using bicarb as a replacement fluid with CRRT- much improved 5. Hyperkalemia- corrected  6. Sepsis?  - on zosyn/vanc- CT negative for source- per CCM- now on rocephin  7. Hypophos- resolved  GOLDS[place]OUGH,KELLIE A    Labs: Basic Metabolic Panel:  Recent Labs Lab 02/27/17 0323 02/27/17 1600 02/28/17 0345  NA 137 137 139  K 3.9 3.5 3.4*  CL 101 102 102  CO2 27 27 28  GLUCOSE 222* 225* 118*  BUN 12 19 21*  CREATININE 1.26* 2.09* 2.87*  CALCIUM  7.4* 7.4* 7.7*  PHOS 1.4* 4.9* 5.7*   Liver Function Tests:  Recent Labs Lab 02/24/17 1600 02/24/17 2318  02/27/17 0323 02/27/17 1600 02/28/17 0345  AST 26 140*  --   --   --   --   ALT 14 69*  --   --   --   --   ALKPHOS 80 64  --   --   --   --   BILITOT 0.5 0.9  --   --   --   --   PROT 7.5 5.4*  --   --   --   --   ALBUMIN 3.0* 2.0*  < > 2.2* 2.1* 2.0*  < > = values in this interval not displayed.  Recent Labs Lab 02/24/17 1600  LIPASE 205*   No results for input(s): AMMONIA in the last 168 hours. CBC:  Recent Labs Lab 02/24/17 1600  02/25/17 0310 02/25/17 2107 02/26/17 0511 02/27/17 0323  WBC 27.8*  --  30.3*  --  8.8 9.8  NEUTROABS 22.3*  --   --   --   --   --   HGB 10.2*  < > 9.3* 8.2*   8.8* 8.0*  HCT 31.2*  < > 26.9* 24.0* 24.8* 23.0*  MCV 86.2  --  82.3  --  79.5 80.4  PLT 419*  --  342  --  281 265  < > = values in this interval not displayed. Cardiac Enzymes:  Recent Labs Lab 02/24/17 1916  02/25/17 1748 02/25/17 2309 02/26/17 0511 02/26/17 1005 02/26/17 1548  CKTOTAL 62  --   --   --   --   --   --   TROPONINI  --   < > 0.86* 0.81* 0.67* 0.57* 0.43*  < > = values in this interval not displayed. CBG:  Recent Labs Lab 02/27/17 1220 02/27/17 1700 02/27/17 2046 02/28/17 0022 02/28/17 0330  GLUCAP 249* 223* 141* 125* 110*    Iron Studies:  No results for input(s): IRON, TIBC, TRANSFERRIN, FERRITIN in the last 72 hours. Studies/Results: Dg Chest Port 1 View  Result Date: 02/27/2017 CLINICAL DATA:  Endotracheal tube in place. EXAM: PORTABLE CHEST 1 VIEW COMPARISON:  One-view chest x-ray 02/26/2017 FINDINGS: The heart size is exaggerated by low lung volumes. Endotracheal tube remains low, 1.8 cm above the carina. The side port of the NG tube tip is above the GE junction. A right IJ line terminates in the SVC. Moderate pulmonary vascular congestion is present. Lung volumes have decreased slightly. Bibasilar airspace disease likely reflects  atelectasis. IMPRESSION: 1. Endotracheal tube remains low, 1.8 cm above the carina. 2. Support apparatus is otherwise stable. The side port of the NG tube is above the GE junction. 3. Slight decrease in lung volumes with residual bibasilar atelectasis. Electronically Signed   By: San Morelle M.D.   On: 02/27/2017 07:40   Medications: Infusions: . sodium chloride 250 mL (02/28/17 0400)  . cefTRIAXone (ROCEPHIN)  IV Stopped (02/27/17 1307)  . heparin 10,000 units/ 20 mL infusion syringe Stopped (02/26/17 0900)  . dialysis replacement fluid (prismasate) 800 mL/hr at 02/27/17 0341  . dialysis replacement fluid (prismasate) 250 mL/hr at 02/26/17 2024  . dialysate (PRISMASATE) 2,000 mL/hr at 02/27/17 0305  . sodium chloride 1,000 mL (02/24/17 2348)    Scheduled Medications: . aspirin  81 mg Per Tube Daily  . chlorhexidine gluconate (MEDLINE KIT)  15 mL Mouth Rinse BID  . feeding supplement (VITAL HIGH PROTEIN)  1,000 mL Per Tube Q24H  . heparin  5,000 Units Subcutaneous Q8H  . insulin aspart  0-15 Units Subcutaneous Q4H  . insulin glargine  5 Units Subcutaneous Daily  . mouth rinse  15 mL Mouth Rinse QID  . metoprolol tartrate  2.5 mg Intravenous Q6H  . pantoprazole (PROTONIX) IV  40 mg Intravenous Daily    have reviewed scheduled and prn medications.  Physical Exam: General:  Extubated, appropriate  Heart: RRR Lungs: CBS bilat  Abdomen: soft, non tender Extremities: no peripheral edema Dialysis Access: right IJ vas cath placed 5/16     02/28/2017,7:28 AM  LOS: 4 days

## 2017-02-28 NOTE — Progress Notes (Addendum)
Report called to [employer] at [employer] [employer] Will transfer via bed, nurse, and NT. VS WNL. Called Doris and let her know of the transfer and new room number.

## 2017-02-28 NOTE — Progress Notes (Signed)
PULMONARY / CRITICAL CARE MEDICINE   Name: [name] [name] MRN: [id number] DOB: [birth year]    ADMISSION DATE:  02/24/2017 CONSULTATION DATE:  02/24/17  REFERRING MD:  Billy Fischer  CHIEF COMPLAINT:  AMS  HISTORY OF PRESENT ILLNESS:  Pt is encephelopathic; therefore, this HPI is obtained from chart review. [name] [name] is a 71 y.o. female with PMH as outlined below. She was brought to [facility] ED 5/16 after daughter thought she was possible having a stroke due to slurred speech and inability to walk.  She also had been complaining of vague abdominal pain, 3 days of nonbloody nonbilious vomiting, 3 days of nonbloody diarrhea, and generalized weakness with worsening fatigue. In ED, she was found to be altered and had multiple metabolic derangements including acute renal failure with SCr of 10.48, hyperkalemia with K 6, AGMA, lactate 10.79.  CT of the head, abdomen / pelvis were all normal. Given her metabolic derangements and concern for deterioration with need for intubation, PCCM was asked to admit to ICU.  SUBJECTIVE/INTERVAL HISTORY: Extubated yesterday. Stable  VITAL SIGNS: BP (!) 126/48 (BP Location: Right Arm)   Pulse 95   Temp 100 F (37.8 C) (Core (Comment))   Resp 13   Ht 5\' 3"  (1.6 m)   Wt 220 lb 3.8 oz (99.9 kg)   SpO2 99%   BMI 39.01 kg/m   HEMODYNAMICS:    VENTILATOR SETTINGS: Vent Mode: CPAP;PSV FiO2 (%):  [30 %] 30 % PEEP:  [5 cmH20] 5 cmH20 Pressure Support:  [5 cmH20-12 cmH20] 5 cmH20  INTAKE / OUTPUT: I/O last 3 completed shifts: In: 2260.7 [I.V.:586.4; Other:40; NG/GT:1243.3; IV Piggyback:391] Out: 2163 [Urine:800; XTGGY:694; Stool:490]   PHYSICAL EXAMINATION:  Gen:      No acute distress, obese HEENT:  EOMI, sclera anicteric Neck:     No masses; no thyromegaly Lungs:    Clear to auscultation bilaterally; normal respiratory effort CV:         Regular rate and rhythm; no murmurs Abd:      + bowel sounds; soft, non-tender; no palpable masses, no  distension Ext:    No edema; adequate peripheral perfusion Skin:      Warm and dry; no rash Neuro: Awake, responsive   LABS:  BMET  Recent Labs Lab 02/27/17 0323 02/27/17 1600 02/28/17 0345  NA 137 137 139  K 3.9 3.5 3.4*  CL 101 102 102  CO2 27 27 28   BUN 12 19 21*  CREATININE 1.26* 2.09* 2.87*  GLUCOSE 222* 225* 118*    Electrolytes  Recent Labs Lab 02/26/17 1548 02/27/17 0323 02/27/17 1600 02/28/17 0345 02/28/17 0346  CALCIUM 7.2* 7.4* 7.4* 7.7*  --   MG 2.2 2.2  --   --  2.2  PHOS 2.6  2.7 1.4* 4.9* 5.7*  --     CBC  Recent Labs Lab 02/25/17 0310 02/25/17 2107 02/26/17 0511 02/27/17 0323  WBC 30.3*  --  8.8 9.8  HGB 9.3* 8.2* 8.8* 8.0*  HCT 26.9* 24.0* 24.8* 23.0*  PLT 342  --  281 265    Coag's  Recent Labs Lab 02/24/17 1600  02/26/17 0511 02/27/17 0323 02/28/17 0346  APTT  --   < > >200* 43* 41*  INR 1.36  --   --   --   --   < > = values in this interval not displayed.  Sepsis Markers  Recent Labs Lab 02/24/17 2318 02/25/17 0050 02/25/17 0310 02/25/17 0744 02/26/17 0511 02/26/17 0810  LATICACIDVEN 10.1* 9.0*  --  8.1*  --  2.1*  PROCALCITON 1.41  --  1.41  --  1.99  --     ABG  Recent Labs Lab 02/25/17 1222 02/25/17 1417 02/26/17 1514  PHART 7.506* 7.478* 7.301*  PCO2ART 23.2* 28.2* 54.4*  PO2ART 139.0* 92.0 76.0*    Liver Enzymes  Recent Labs Lab 02/24/17 1600 02/24/17 2318  02/27/17 0323 02/27/17 1600 02/28/17 0345  AST 26 140*  --   --   --   --   ALT 14 69*  --   --   --   --   ALKPHOS 80 64  --   --   --   --   BILITOT 0.5 0.9  --   --   --   --   ALBUMIN 3.0* 2.0*  < > 2.2* 2.1* 2.0*  < > = values in this interval not displayed.  Cardiac Enzymes  Recent Labs Lab 02/26/17 0511 02/26/17 1005 02/26/17 1548  TROPONINI 0.67* 0.57* 0.43*    Glucose  Recent Labs Lab 02/27/17 1220 02/27/17 1700 02/27/17 2046 02/28/17 0022 02/28/17 0330 02/28/17 0918  GLUCAP 249* 223* 141* 125* 110* 194*     Imaging No results found.  STUDIES:  CT head 5/16 > negative. CT A / P 5/16 > appendix slightly enlarged but no surrounding inflammatory changes. CXR 5/16 > cardiomegaly. Echo 5/17 > Vigorous LV systolic function; mild diastolic dysfunction;   sclerotic aortic valve with mild AI; mild MR; mild TR with mildly   elevated pulmonary pressure. Renal US 5/16 > Unremarkable  CULTURES: Blood 5/16 > Coag neg staph on reflex panel, cultures pending >>> Sputum 5/16 > MRSA Urine 5/16 >   ANTIBIOTICS: Vanc 5/16 > 5/18 Zosyn 5/16 > 5/18 Ceftriaxone 5/18 > 5/20 Bactrim 5/20 >  SIGNIFICANT EVENTS: 5/16 > admit.  LINES/TUBES: ETT 5/16 > HD cath 5/16 >    DISCUSSION: 71 y.o. female admitted 5/16 with acute renal failure, dehydration, and concern for sepsis.  Given multiple metabolic derangements, she was admitted to ICU. She was intubated for respiratory failure and ultimately required CRRT and additionally pressors for shock. She was quickly able to come off pressors, however continues to require vent and CRRT.  ASSESSMENT / PLAN:  CARDIOVASCULAR A:  Shock unclear etiology. Septic vs hypovolemic. (resolved) Troponin rise with c/o chest pain. (troponins now trending down, non-acute EKG) Hx HTN, HLD, CAD.  P:  Continue tele monitoring Restart ASA, imdur, atelenol, statin.  DC standing lopressor Holding benazepril, furosemide  RENAL A:   AoCKD - presumed pre-renal from hypovolemia / dehydration, also possibly component of ATN.  Also exacerbated by ACEi + NSAIDs. Hyperkalemia (improved) AGMA - lactate + uremia + metformin(resolved) P:   Nephrology following. Off dialysis. Hopeful for renal recovery  INFECTIOUS A:   Concern for sepsis of unclear etiology.  Coag neg staph indicated on blood cx reflex panel, likely contaminant.  MRSA in sputum P:   Start bactrim instead of ceftriaxone Follow cultures  PULMONARY A: Acute resp failure. Extubated P:   Wean down O2 as  tolerated  GASTROINTESTINAL A:   Nutrition. Nausea with vomiting - CT A/P negative. P:   PO diet Zofran PRN Protonix for SUP  HEMATOLOGIC A:   VTE Prophylaxis. Coagulopathy (aptt > 200) no signs bleeding. Resolved P:  SCD's  Follow CBC  ENDOCRINE A:   DM. P:   SSI, change to AC, HS, CBG monitoring Continue Lantus Hold preadmission metformin, victoza.  NEUROLOGIC A:   Acute encephalopathy - ?  Due to sepsis vs uremia vs metabolic vs CVA (though CVA less likely given non-focal exam, initial head CT neg). Improved P:   Mental status is improving.  Hold preadmission lyrica, paroxetine, zolpidem.  Family updated: Updated daughter [name] [name] via telephone 5/18  Interdisciplinary Family Meeting v Palliative Care Meeting:  Due by: 03/02/17  Marshell Garfinkel MD [place] [facility] Pager [phone] If no answer or after 3pm call: [phone] 02/28/2017, 9:23 AM

## 2017-02-28 NOTE — Progress Notes (Signed)
Pharmacy Antibiotic Note  [name] [name] is a 71 y.o. female admitted on 02/24/2017 with pneumonia.  Pharmacy has been consulted for bactrim dosing.  Plan: Bactrim 5mg /kg IV q12h Monitor culture data, renal function and clinical course   Height: 5\' 3"  (160 cm) Weight: 220 lb 3.8 oz (99.9 kg) IBW/kg (Calculated) : 52.4  Temp (24hrs), Avg:99.7 F (37.6 C), Min:98.5 F (36.9 C), Max:100 F (37.8 C)   Recent Labs Lab 02/24/17 1600  02/24/17 1856 02/24/17 2318 02/25/17 0050 02/25/17 0310 02/25/17 0744  02/26/17 0511 02/26/17 0810 02/26/17 1005 02/26/17 1548 02/27/17 0323 02/27/17 1600 02/28/17 0345  WBC 27.8*  --   --   --   --  30.3*  --   --  8.8  --   --   --  9.8  --   --   CREATININE 10.48*  --  10.80* 7.94*  --  5.97*  --   < > 2.03*  --  1.70* 1.42* 1.26* 2.09* 2.87*  LATICACIDVEN  --   < > 10.64* 10.1* 9.0*  --  8.1*  --   --  2.1*  --   --   --   --   --   < > = values in this interval not displayed.  Estimated Creatinine Clearance: 20.3 mL/min (A) (by C-G formula based on SCr of 2.87 mg/dL (H)).    No Known Allergies  Antimicrobials this admission: 5/16 Vanc >> 5/18 5/16 Zosyn >> 5/18 5/18 CTX >> 5/20 5/20 Bactrim >>  Microbiology results: 5/16 BCx: 1/2 cons 5/16 urine ng 5/17 Sputum: MRSA (sens to clinda, tcn, bactrim and vanc)   Andrey Cota. Diona Foley, PharmD, BCPS Clinical Pharmacist [phone] 02/28/2017 9:47 AM

## 2017-02-28 NOTE — Consult Note (Signed)
[place] Nurse wound consult note Reason for Consult: IV infiltrate to dorsal aspect of left hand. Wound type: traumatic Pressure Injury POA: No Measurement:8cm x 7cm area affected with epidermal darkening and lifting. Area nearest the thumb has fluid accumulation beneath, other affected area is not blistered. No skin separation (wound) at this time Wound bed:As described above Drainage (amount, consistency, odor) Two areas of scant light yellow drainage on old dressing (telfa non-adherent) Periwound:Intact, dry.  No warmth or induration.   Dressing procedure/placement/frequency: I will implement a conservative twice daily POC including cleansing with NS, and gently patting dry followed by placement of a non-adherent, oil emulsion dressing (Adaptic). This will be topped with dry gauze 4x4s and secured using a conform bandage.  While complete resolution is likely, consideration for consultation with an orthopedic MD with a specialty in hand management is suggested.  If you agree, please order. [facility] nursing team will not follow, but will remain available to this patient, the nursing and medical teams.  Please re-consult if needed. Thanks, Maudie Flakes, MSN, RN, [place], Ar[facility]  Pager# [phone]

## 2017-03-01 ENCOUNTER — Inpatient Hospital Stay (HOSPITAL_COMMUNITY): Payer: Medicare Other

## 2017-03-01 ENCOUNTER — Other Ambulatory Visit: Payer: Medicare Other

## 2017-03-01 LAB — BASIC METABOLIC PANEL
Anion gap: 12 (ref 5–15)
BUN: 30 mg/dL — ABNORMAL HIGH (ref 6–20)
CO2: 26 mmol/L (ref 22–32)
Calcium: 8 mg/dL — ABNORMAL LOW (ref 8.9–10.3)
Chloride: 97 mmol/L — ABNORMAL LOW (ref 101–111)
Creatinine, Ser: 3.91 mg/dL — ABNORMAL HIGH (ref 0.44–1.00)
GFR calc Af Amer: 12 mL/min — ABNORMAL LOW (ref >60)
GFR calc non Af Amer: 11 mL/min — ABNORMAL LOW (ref >60)
Glucose, Bld: 273 mg/dL — ABNORMAL HIGH (ref 65–99)
Potassium: 3.9 mmol/L (ref 3.5–5.1)
Sodium: 135 mmol/L (ref 135–145)

## 2017-03-01 LAB — HEMOGLOBIN AND HEMATOCRIT, BLOOD
HCT: 25.3 % — ABNORMAL LOW (ref 36.0–46.0)
Hemoglobin: 8.8 g/dL — ABNORMAL LOW (ref 12.0–15.0)

## 2017-03-01 LAB — CBC
HCT: 20.3 % — ABNORMAL LOW (ref 36.0–46.0)
Hemoglobin: 7 g/dL — ABNORMAL LOW (ref 12.0–15.0)
MCH: 28 pg (ref 26.0–34.0)
MCHC: 34.5 g/dL (ref 30.0–36.0)
MCV: 81.2 fL (ref 78.0–100.0)
Platelets: 249 10*3/uL (ref 150–400)
RBC: 2.5 MIL/uL — ABNORMAL LOW (ref 3.87–5.11)
RDW: 14.6 % (ref 11.5–15.5)
WBC: 9.8 10*3/uL (ref 4.0–10.5)

## 2017-03-01 LAB — CULTURE, BLOOD (ROUTINE X 2)
Culture: NO GROWTH
Special Requests: ADEQUATE

## 2017-03-01 LAB — GLUCOSE, CAPILLARY
Glucose-Capillary: 282 mg/dL — ABNORMAL HIGH (ref 65–99)
Glucose-Capillary: 183 mg/dL — ABNORMAL HIGH (ref 65–99)
Glucose-Capillary: 179 mg/dL — ABNORMAL HIGH (ref 65–99)
Glucose-Capillary: 144 mg/dL — ABNORMAL HIGH (ref 65–99)

## 2017-03-01 LAB — PREPARE RBC (CROSSMATCH)

## 2017-03-01 LAB — ALBUMIN: Albumin: 2.2 g/dL — ABNORMAL LOW (ref 3.5–5.0)

## 2017-03-01 LAB — PHOSPHORUS: Phosphorus: 4.7 mg/dL — ABNORMAL HIGH (ref 2.5–4.6)

## 2017-03-01 LAB — APTT: aPTT: 47 seconds — ABNORMAL HIGH (ref 24–36)

## 2017-03-01 LAB — MAGNESIUM: Magnesium: 2 mg/dL (ref 1.7–2.4)

## 2017-03-01 MED ORDER — DOXYCYCLINE HYCLATE 100 MG PO TABS
100.0000 mg | ORAL_TABLET | Freq: Two times a day (BID) | ORAL | Status: DC
  Administered 2017-03-01 – 2017-03-04 (×7): 100 mg via ORAL
  Filled 2017-03-01 (×7): qty 1

## 2017-03-01 MED ORDER — SODIUM CHLORIDE 0.9% FLUSH
10.0000 mL | INTRAVENOUS | Status: DC | PRN
  Administered 2017-03-02 – 2017-03-04 (×3): 10 mL
  Filled 2017-03-01 (×3): qty 40

## 2017-03-01 MED ORDER — FUROSEMIDE 10 MG/ML IJ SOLN
20.0000 mg | Freq: Once | INTRAMUSCULAR | Status: AC
Start: 2017-03-01 — End: 2017-03-01
  Administered 2017-03-01: 20 mg via INTRAVENOUS
  Filled 2017-03-01: qty 2

## 2017-03-01 MED ORDER — SODIUM CHLORIDE 0.9 % IV SOLN: Freq: Once | INTRAVENOUS | Status: DC

## 2017-03-01 MED ORDER — INSULIN GLARGINE 100 UNIT/ML ~~LOC~~ SOLN
15.0000 [IU] | Freq: Every day | SUBCUTANEOUS | Status: DC
  Administered 2017-03-02 – 2017-03-04 (×3): 15 [IU] via SUBCUTANEOUS
  Filled 2017-03-01 (×3): qty 0.15

## 2017-03-01 MED ORDER — HYDROCODONE-ACETAMINOPHEN 5-325 MG PO TABS
1.0000 | ORAL_TABLET | ORAL | Status: DC | PRN
  Administered 2017-03-01: 1 via ORAL
  Filled 2017-03-01: qty 1

## 2017-03-01 MED ORDER — INSULIN GLARGINE 100 UNIT/ML ~~LOC~~ SOLN
10.0000 [IU] | Freq: Once | SUBCUTANEOUS | Status: AC
  Administered 2017-03-01: 10 [IU] via SUBCUTANEOUS
  Filled 2017-03-01: qty 0.1

## 2017-03-01 MED ORDER — PANTOPRAZOLE SODIUM 40 MG PO TBEC
40.0000 mg | DELAYED_RELEASE_TABLET | Freq: Every day | ORAL | Status: DC
  Administered 2017-03-01 – 2017-03-04 (×4): 40 mg via ORAL
  Filled 2017-03-01 (×4): qty 1

## 2017-03-01 MED ORDER — SULFAMETHOXAZOLE-TRIMETHOPRIM 400-80 MG/5ML IV SOLN: 500.0000 mg | INTRAVENOUS | Status: DC

## 2017-03-01 NOTE — Progress Notes (Signed)
Patient found with tourniquet on right upper arm. Patient stated that tourniquet had been there since approximately 0500. Tourniquet removed from patients right upper arm and assessed. Right extremity cool, but pulses are palpable. Capillary refill on right arm greater that three seconds. Patient complains of no pain or numbness. MD notified. Will continue to monitor.

## 2017-03-01 NOTE — Progress Notes (Signed)
[facility] ROUNDING NOTE   Subjective:   Interval History:Pt is a 71 y.o. yo female with DM and HTN but creatinine of 1.1 on 12/10/16 who was admitted on 02/24/2017 with seems like sepsis syndrome with AKI- severe acidosis and hyperkalemia- home meds included ACE, NSAID and potassium  She required brief support with CRRT and is now making urine  No dialysis since 5/19  Objective:  Vital signs in last 24 hours:  Temp:  [98.4 F (36.9 C)-100 F (37.8 C)] 98.5 F (36.9 C) (05/21 1000) Pulse Rate:  [76-101] 76 (05/21 1000) Resp:  [16-21] 18 (05/21 1000) BP: (110-135)/(46-70) 122/51 (05/21 1000) SpO2:  [90 %-96 %] 94 % (05/21 1000) Weight:  [220 lb 3.8 oz (99.9 kg)] 220 lb 3.8 oz (99.9 kg) (05/21 0500)  Weight change: 0 lb (0 kg) Filed Weights   02/27/17 0200 02/28/17 0340 03/01/17 0500  Weight: 223 lb 8.7 oz (101.4 kg) 220 lb 3.8 oz (99.9 kg) 220 lb 3.8 oz (99.9 kg)    Intake/Output: I/O last 3 completed shifts: In: 1781.3 [P.O.:960; I.V.:270; Other:20; IV Piggyback:531.3] Out: 775 [Urine:775]   Intake/Output this shift:  Total I/O In: 760 [P.O.:720; I.V.:40] Out: 0   CVS- RRR RS- CTA ABD- BS present soft non-distended EXT- no edema   Basic Metabolic Panel:  Recent Labs Lab 02/26/17 0511  02/26/17 1548 02/27/17 0323 02/27/17 1600 02/28/17 0345 02/28/17 0346 03/01/17 0525  NA 136  < > 137 137 137 139  --  135  K 3.5  < > 4.2 3.9 3.5 3.4*  --  3.9  CL 98*  < > 102 101 102 102  --  97*  CO2 26  < > 26 27 27 28   --  26  GLUCOSE 186*  < > 206* 222* 225* 118*  --  273*  BUN 20  < > 12 12 19  21*  --  30*  CREATININE 2.03*  < > 1.42* 1.26* 2.09* 2.87*  --  3.91*  CALCIUM 7.2*  < > 7.2* 7.4* 7.4* 7.7*  --  8.0*  MG 2.1  --  2.2 2.2  --   --  2.2 2.0  PHOS 1.5*  1.6*  --  2.6  2.7 1.4* 4.9* 5.7*  --  4.7*  < > = values in this interval not displayed.  Liver Function Tests:  Recent Labs Lab 02/24/17 1600 02/24/17 2318  02/26/17 1548  02/27/17 0323 02/27/17 1600 02/28/17 0345 03/01/17 0525  AST 26 140*  --   --   --   --   --   --   ALT 14 69*  --   --   --   --   --   --   ALKPHOS 80 64  --   --   --   --   --   --   BILITOT 0.5 0.9  --   --   --   --   --   --   PROT 7.5 5.4*  --   --   --   --   --   --   ALBUMIN 3.0* 2.0*  < > 2.4* 2.2* 2.1* 2.0* 2.2*  < > = values in this interval not displayed.  Recent Labs Lab 02/24/17 1600  LIPASE 205*   No results for input(s): AMMONIA in the last 168 hours.  CBC:  Recent Labs Lab 02/24/17 1600  02/25/17 0310 02/25/17 2107 02/26/17 0511 02/27/17 0323 03/01/17 0525  WBC 27.8*  --  30.3*  --  8.8 9.8 9.8  NEUTROABS 22.3*  --   --   --   --   --   --   HGB 10.2*  < > 9.3* 8.2* 8.8* 8.0* 7.0*  HCT 31.2*  < > 26.9* 24.0* 24.8* 23.0* 20.3*  MCV 86.2  --  82.3  --  79.5 80.4 81.2  PLT 419*  --  342  --  281 265 249  < > = values in this interval not displayed.  Cardiac Enzymes:  Recent Labs Lab 02/24/17 1916  02/25/17 1748 02/25/17 2309 02/26/17 0511 02/26/17 1005 02/26/17 1548  CKTOTAL 62  --   --   --   --   --   --   TROPONINI  --   < > 0.86* 0.81* 0.67* 0.57* 0.43*  < > = values in this interval not displayed.  BNP: Invalid input(s): POCBNP  CBG:  Recent Labs Lab 02/28/17 0918 02/28/17 1218 02/28/17 1707 02/28/17 2126 03/01/17 0808  GLUCAP 194* 211* 398* 246* 282*    Microbiology: Results for orders placed or performed during the hospital encounter of 02/24/17  Blood Culture (routine x 2)     Status: Abnormal   Collection Time: 02/24/17  4:00 PM  Result Value Ref Range Status   Specimen Description BLOOD RIGHT ANTECUBITAL  Final   Special Requests   Final    BOTTLES DRAWN AEROBIC AND ANAEROBIC Blood Culture adequate volume   Culture  Setup Time   Final    GRAM POSITIVE COCCI ANAEROBIC BOTTLE ONLY CRITICAL RESULT CALLED TO, READ BACK BY AND VERIFIED WITH: Ferne Coe PHARMD [id number] 02/25/17 A BROWNING    Culture (A)  Final     STAPHYLOCOCCUS SPECIES (COAGULASE NEGATIVE) THE SIGNIFICANCE OF ISOLATING THIS ORGANISM FROM A SINGLE SET OF BLOOD CULTURES WHEN MULTIPLE SETS ARE DRAWN IS UNCERTAIN. PLEASE NOTIFY THE MICROBIOLOGY DEPARTMENT WITHIN ONE WEEK IF SPECIATION AND SENSITIVITIES ARE REQUIRED.    Report Status 02/27/2017 FINAL  Final  Blood Culture ID Panel (Reflexed)     Status: Abnormal   Collection Time: 02/24/17  4:00 PM  Result Value Ref Range Status   Enterococcus species NOT DETECTED NOT DETECTED Final   Listeria monocytogenes NOT DETECTED NOT DETECTED Final   Staphylococcus species DETECTED (A) NOT DETECTED Final    Comment: Methicillin (oxacillin) susceptible coagulase negative staphylococcus. Possible blood culture contaminant (unless isolated from more than one blood culture draw or clinical case suggests pathogenicity). No antibiotic treatment is indicated for blood  culture contaminants. CRITICAL RESULT CALLED TO, READ BACK BY AND VERIFIED WITH: Ferne Coe PHARMD [id number] 02/25/17 A BROWNING    Staphylococcus aureus NOT DETECTED NOT DETECTED Final   Methicillin resistance NOT DETECTED NOT DETECTED Final   Streptococcus species NOT DETECTED NOT DETECTED Final   Streptococcus agalactiae NOT DETECTED NOT DETECTED Final   Streptococcus pneumoniae NOT DETECTED NOT DETECTED Final   Streptococcus pyogenes NOT DETECTED NOT DETECTED Final   Acinetobacter baumannii NOT DETECTED NOT DETECTED Final   Enterobacteriaceae species NOT DETECTED NOT DETECTED Final   Enterobacter cloacae complex NOT DETECTED NOT DETECTED Final   Escherichia coli NOT DETECTED NOT DETECTED Final   Klebsiella oxytoca NOT DETECTED NOT DETECTED Final   Klebsiella pneumoniae NOT DETECTED NOT DETECTED Final   Proteus species NOT DETECTED NOT DETECTED Final   Serratia marcescens NOT DETECTED NOT DETECTED Final   Haemophilus influenzae NOT DETECTED NOT DETECTED Final   Neisseria meningitidis NOT DETECTED NOT DETECTED Final   Pseudomonas aeruginosa  NOT DETECTED NOT DETECTED Final   Candida albicans NOT DETECTED NOT DETECTED Final   Candida glabrata NOT DETECTED NOT DETECTED Final   Candida krusei NOT DETECTED NOT DETECTED Final   Candida parapsilosis NOT DETECTED NOT DETECTED Final   Candida tropicalis NOT DETECTED NOT DETECTED Final  Blood Culture (routine x 2)     Status: None   Collection Time: 02/24/17  4:45 PM  Result Value Ref Range Status   Specimen Description BLOOD RIGHT HAND  Final   Special Requests   Final    BOTTLES DRAWN AEROBIC AND ANAEROBIC Blood Culture adequate volume   Culture NO GROWTH 5 DAYS  Final   Report Status 03/01/2017 FINAL  Final  Urine culture     Status: None   Collection Time: 02/24/17  6:36 PM  Result Value Ref Range Status   Specimen Description URINE, CATHETERIZED  Final   Special Requests NONE  Final   Culture NO GROWTH  Final   Report Status 02/26/2017 FINAL  Final  MRSA PCR Screening     Status: Abnormal   Collection Time: 02/24/17 10:00 PM  Result Value Ref Range Status   MRSA by PCR (A) NEGATIVE Final    INVALID, UNABLE TO DETERMINE THE PRESENCE OF TARGET DNA DUE TO SPECIMEN INTEGRITY. RECOLLECTION REQUESTED.    Comment:        The GeneXpert MRSA Assay (FDA approved for NASAL specimens only), is one component of a comprehensive MRSA colonization surveillance program. It is not intended to diagnose MRSA infection nor to guide or monitor treatment for MRSA infections. CALLED TO V.SMITH,RN AT 0424 02/25/17 BY L.PITT   MRSA culture     Status: None   Collection Time: 02/24/17 10:00 PM  Result Value Ref Range Status   Specimen Description NOSE  Final   Special Requests NONE  Final   Culture NO MRSA DETECTED  Final   Report Status 02/27/2017 FINAL  Final  Culture, respiratory (NON-Expectorated)     Status: None   Collection Time: 02/25/17  8:12 PM  Result Value Ref Range Status   Specimen Description TRACHEAL ASPIRATE  Final   Special Requests NONE  Final   Gram Stain   Final     MODERATE WBC PRESENT,BOTH PMN AND MONONUCLEAR NO ORGANISMS SEEN    Culture RARE METHICILLIN RESISTANT STAPHYLOCOCCUS AUREUS  Final   Report Status 02/28/2017 FINAL  Final   Organism ID, Bacteria METHICILLIN RESISTANT STAPHYLOCOCCUS AUREUS  Final      Susceptibility   Methicillin resistant staphylococcus aureus - MIC*    CIPROFLOXACIN >=8 RESISTANT Resistant     ERYTHROMYCIN <=0.25 SENSITIVE Sensitive     GENTAMICIN <=0.5 SENSITIVE Sensitive     OXACILLIN >=4 RESISTANT Resistant     TETRACYCLINE <=1 SENSITIVE Sensitive     VANCOMYCIN 1 SENSITIVE Sensitive     TRIMETH/SULFA <=10 SENSITIVE Sensitive     CLINDAMYCIN <=0.25 SENSITIVE Sensitive     RIFAMPIN <=0.5 SENSITIVE Sensitive     Inducible Clindamycin NEGATIVE Sensitive     * RARE METHICILLIN RESISTANT STAPHYLOCOCCUS AUREUS    Coagulation Studies: No results for input(s): LABPROT, INR in the last 72 hours.  Urinalysis: No results for input(s): COLORURINE, LABSPEC, PHURINE, GLUCOSEU, HGBUR, BILIRUBINUR, KETONESUR, PROTEINUR, UROBILINOGEN, NITRITE, LEUKOCYTESUR in the last 72 hours.  Invalid input(s): APPERANCEUR    Imaging: Dg Chest Port 1 View  Result Date: 03/01/2017 CLINICAL DATA:  Acute respiratory failure. EXAM: PORTABLE CHEST 1 VIEW COMPARISON:  02/27/2017 FINDINGS: Right jugular catheter terminates over the  mid SVC. Endotracheal and enteric tubes have been removed. The cardiomediastinal silhouette is unchanged. Lung volumes are increased with improved aeration of the lung bases. Minimal right and mild left basilar opacity remain. No sizable pleural effusion or pneumothorax is identified. IMPRESSION: Interval extubation with improved aeration bilaterally. Mild residual left greater than right basilar atelectasis. Electronically Signed   By: Logan Bores M.D.   On: 03/01/2017 07:30     Medications:   . sodium chloride 250 mL (02/28/17 0400)   . aspirin  81 mg Per Tube Daily  . atenolol  25 mg Oral Daily  .  atorvastatin  10 mg Oral q1800  . doxycycline  100 mg Oral Q12H  . furosemide  20 mg Intravenous Once  . heparin  5,000 Units Subcutaneous Q8H  . insulin aspart  0-15 Units Subcutaneous TID AC & HS  . insulin glargine  10 Units Subcutaneous Once  . [START ON 03/02/2017] insulin glargine  15 Units Subcutaneous Daily  . isosorbide mononitrate  30 mg Oral Daily  . mouth rinse  15 mL Mouth Rinse BID  . pantoprazole  40 mg Oral Daily   sodium chloride, HYDROcodone-acetaminophen, ondansetron (ZOFRAN) IV  Assessment/ Plan:  1. Renal- AKI in the setting of extreme hypotension on ACE/NSAID- U/A pretty bland- CT no obstruction.  Suspect ATN.  Required CRRT due to hemodynamic instability/acidosis and hyperkalemia- using heparin with - started overnight 5/16---- stopped 5/19 AM.  Will continue to observe Creatinine a little higher but appears to have good urine output - no dialysis needs  2. HTN/vol- euvolemic and controlled 3. Anemia- hgb 9.3--8.8--8.0 appears stable watch for now- transfuse as needed 4. Metabolic acidosis-  resolved  5. Hyperkalemia- corrected  6. Sepsis?  - on zosyn/vanc- CT negative for source- per CCM- now on rocephin  7. Hypophos- resolved     LOS: 5 [place] W @TODAY @10 :26 AM

## 2017-03-01 NOTE — Progress Notes (Addendum)
@IPLOG         PROGRESS NOTE                                                                                                                                                                                                             Patient Demographics:    [name] [name], is a 71 y.o. female, DOB - [birth year], DJM:[id number]  Admit date - 02/24/2017   Admitting Physician Juanito Doom, MD  Outpatient Primary MD for the patient is Bartholomew Crews, MD  LOS - 5  Chief Complaint  Patient presents with  . Fatigue       Brief Narrative   71 yr female with long term DM, HTN according to family.  Hx CAD with Cath 2229, diastolic CHF but EF >79%, DJD, Cervical stenosis, lumbar disc dz, DM neuropathy, dyslipidemia, obesity & depression who was admitted on 02/24/2017 by pulmonary critical care due to generalized weakness, metabolic encephalopathy with hypovolemic shock, acute renal failure, she required endotracheal intubation due to encephalopathy and failure to protect airway. She was extubated on 02/28/2017 and transferred to my care.   Subjective:    [name] [name] today has, No headache, No chest pain, No abdominal pain - No Nausea, No new weakness tingling or numbness, No Cough - SOB. Has some chronic neck and right elbow pain.   Assessment  & Plan :     1.Acute renal failure. He required CRRT, has a right IJ dialysis catheter, nephrology following, cause of acute renal failure thought to be dehydration with concurrent Lasix, ACE and NSAID use, currently being managed off CRRT and HD. Offending medications on hold, We'll defer management of this problem to nephrology for following. Renal ultrasound was unremarkable.  2. Contamination with 1 out of 2 coag-negative staph, possible mild bronchitis with few staph aureus in the sputum. No evidence of ongoing pneumonia, de-escalate antibiotics to doxycycline on 03/01/2017 and monitor. Chest x-ray stable on 03/01/2017. No evidence of sepsis  while I am seeing the patient.  3. CAD - no chest pain, mild troponin rise time of admission likely due to demand ischemia from dehydration in the setting of severe acute renal failure and elevated creatinine. Echo was stable no wall motion abnormality, troponin trend was flat and in non-ACS pattern. She is currently on aspirin-beta blocker and statin along with Imdur for secondary prevention which will be continued.  4. Chronic neck pain, right elbow pain, history of DJD and L-spine surgery. Supportive care. Will check right elbow x-ray as this pain has been  present for the last 2-3 months.  5. Chronic deconditioning and weakness. PT eval, may require SNF, social work informed on 03/01/2017.  6. Anemia of chronic disease with some drop in H&H due to acute illness and multiple blood draws. We'll transfuse 1 unit of packed RBC on 03/01/2017 and monitor H&H, no evidence of ongoing acute blood loss, will switch Pepcid to PPI.  7. Metabolic encephalopathy requiring endotracheal intubation for airway protection. All resolved back to baseline. Head CT unremarkable.  8. Dyslipidemia. On statin continue.  9. Hypertension. Stable on beta blocker and Imdur.  10. Chronic diastolic CHF EF around 73%. Compensated.  11. DM2 - Lantus + ISS, increase Lantus  Lab Results  Component Value Date   HGBA1C 6.7 (H) 02/24/2017   CBG (last 3)   Recent Labs  02/28/17 1707 02/28/17 2126 03/01/17 0808  GLUCAP 398* 246* 282*      Diet : Diet renal/carb modified with fluid restriction Diet-HS Snack? Nothing; Room service appropriate? Yes; Fluid consistency: Thin    Family Communication  :  None  Code Status :  Full  Disposition Plan  :  TBD may need SNF  Consults  :  PCCM, Renal  Procedures  :    CT head 5/16 > negative. CT A / P 5/16 > appendix slightly enlarged but no surrounding inflammatory changes. CXR 5/21 > mild bilateral bases Echo 5/17 > Vigorous LV systolic function; mild diastolic  dysfunction;sclerotic aortic valve with mild AI; mild MR; mild TR with mildlyelevated pulmonary pressure. Renal US 5/16 > Unremarkable Needed endotracheal intubation on the day of admission extubated 02/28/2017  DVT Prophylaxis  :   Heparin   Lab Results  Component Value Date   PLT 249 03/01/2017    Inpatient Medications  Scheduled Meds: . aspirin  81 mg Per Tube Daily  . atenolol  25 mg Oral Daily  . atorvastatin  10 mg Oral q1800  . doxycycline  100 mg Oral Q12H  . famotidine  20 mg Oral Daily  . furosemide  20 mg Intravenous Once  . heparin  5,000 Units Subcutaneous Q8H  . insulin aspart  0-15 Units Subcutaneous TID AC & HS  . insulin glargine  5 Units Subcutaneous Daily  . isosorbide mononitrate  30 mg Oral Daily  . mouth rinse  15 mL Mouth Rinse BID   Continuous Infusions: . sodium chloride 250 mL (02/28/17 0400)  . sodium chloride     PRN Meds:.sodium chloride, HYDROcodone-acetaminophen, ondansetron (ZOFRAN) IV  Antibiotics  :    Anti-infectives    Start     Dose/Rate Route Frequency Ordered Stop   03/01/17 2300  sulfamethoxazole-trimethoprim (BACTRIM) 500 mg of trimethoprim in dextrose 5 % 500 mL IVPB  Status:  Discontinued     500 mg of trimethoprim 354.2 mL/hr over 90 Minutes Intravenous Every 24 hours 03/01/17 0833 03/01/17 0850   03/01/17 1000  doxycycline (VIBRA-TABS) tablet 100 mg     100 mg Oral Every 12 hours 03/01/17 0850     02/28/17 1030  sulfamethoxazole-trimethoprim (BACTRIM) 500 mg of trimethoprim in dextrose 5 % 500 mL IVPB  Status:  Discontinued     500 mg of trimethoprim 354.2 mL/hr over 90 Minutes Intravenous Every 12 hours 02/28/17 0953 03/01/17 0833   02/26/17 1200  cefTRIAXone (ROCEPHIN) 1 g in dextrose 5 % 50 mL IVPB  Status:  Discontinued     1 g 100 mL/hr over 30 Minutes Intravenous Every 24 hours 02/26/17 1048 02/28/17 0939   02/25/17  1730  vancomycin (VANCOCIN) IVPB 1000 mg/200 mL premix  Status:  Discontinued     1,000 mg 200 mL/hr  over 60 Minutes Intravenous Every 24 hours 02/24/17 2140 02/26/17 1048   02/24/17 2300  piperacillin-tazobactam (ZOSYN) IVPB 3.375 g  Status:  Discontinued     3.375 g 100 mL/hr over 30 Minutes Intravenous Every 6 hours 02/24/17 2140 02/26/17 1048   02/24/17 2200  piperacillin-tazobactam (ZOSYN) IVPB 2.25 g  Status:  Discontinued     2.25 g 100 mL/hr over 30 Minutes Intravenous Every 8 hours 02/24/17 1710 02/24/17 2140   02/24/17 1630  vancomycin (VANCOCIN) 2,000 mg in sodium chloride 0.9 % 500 mL IVPB     2,000 mg 250 mL/hr over 120 Minutes Intravenous  Once 02/24/17 1618 02/24/17 1935   02/24/17 1615  piperacillin-tazobactam (ZOSYN) IVPB 3.375 g     3.375 g 100 mL/hr over 30 Minutes Intravenous  Once 02/24/17 1613 02/24/17 1740   02/24/17 1615  vancomycin (VANCOCIN) IVPB 1000 mg/200 mL premix  Status:  Discontinued     1,000 mg 200 mL/hr over 60 Minutes Intravenous  Once 02/24/17 1613 02/24/17 1618         Objective:   Vitals:   02/28/17 2124 03/01/17 0500 03/01/17 0523 03/01/17 0934  BP: 134/60  (!) 135/56 (!) 110/46  Pulse: 87  80 79  Resp: 16  16   Temp: 99.2 F (37.3 C)  98.4 F (36.9 C)   TempSrc: Oral  Oral   SpO2: 95%  90%   Weight:  99.9 kg (220 lb 3.8 oz)    Height:        Wt Readings from Last 3 Encounters:  03/01/17 99.9 kg (220 lb 3.8 oz)  02/18/17 105.2 kg (232 lb)  12/10/16 95.5 kg (210 lb 8 oz)     Intake/Output Summary (Last 24 hours) at 03/01/17 1005 Last data filed at 03/01/17 0600  Gross per 24 hour  Intake          1011.25 ml  Output                0 ml  Net          1011.25 ml     Physical Exam  Awake Alert, Oriented X 3, No new F.N deficits, Normal affect [place].AT,PERRAL Supple Neck,No JVD, No cervical lymphadenopathy appriciated.  Symmetrical Chest wall movement, Good air movement bilaterally, CTAB RRR,No Gallops,Rubs or new Murmurs, No Parasternal Heave +ve B.Sounds, Abd Soft, No tenderness, No organomegaly appriciated, No rebound -  guarding or rigidity. No Cyanosis, Clubbing or edema, No new Rash or bruise    Right IJ dialysis catheter in place   Data Review:    CBC  Recent Labs Lab 02/24/17 1600  02/25/17 0310 02/25/17 2107 02/26/17 0511 02/27/17 0323 03/01/17 0525  WBC 27.8*  --  30.3*  --  8.8 9.8 9.8  HGB 10.2*  < > 9.3* 8.2* 8.8* 8.0* 7.0*  HCT 31.2*  < > 26.9* 24.0* 24.8* 23.0* 20.3*  PLT 419*  --  342  --  281 265 249  MCV 86.2  --  82.3  --  79.5 80.4 81.2  MCH 28.2  --  28.4  --  28.2 28.0 28.0  MCHC 32.7  --  34.6  --  35.5 34.8 34.5  RDW 15.5  --  15.3  --  14.5 14.4 14.6  LYMPHSABS 4.7*  --   --   --   --   --   --  MONOABS 0.8  --   --   --   --   --   --   EOSABS 0.0  --   --   --   --   --   --   BASOSABS 0.0  --   --   --   --   --   --   < > = values in this interval not displayed.  Chemistries   Recent Labs Lab 02/24/17 1600  02/24/17 2318  02/26/17 0511  02/26/17 1548 02/27/17 0323 02/27/17 1600 02/28/17 0345 02/28/17 0346 03/01/17 0525  NA 140  < > 144  < > 136  < > 137 137 137 139  --  135  K 6.0*  < > 4.4  < > 3.5  < > 4.2 3.9 3.5 3.4*  --  3.9  CL 102  < > 110  < > 98*  < > 102 101 102 102  --  97*  CO2 <7*  --  7*  < > 26  < > 26 27 27 28   --  26  GLUCOSE 84  < > 208*  < > 186*  < > 206* 222* 225* 118*  --  273*  BUN 86*  < > 73*  < > 20  < > 12 12 19  21*  --  30*  CREATININE 10.48*  < > 7.94*  < > 2.03*  < > 1.42* 1.26* 2.09* 2.87*  --  3.91*  CALCIUM 8.2*  --  7.3*  < > 7.2*  < > 7.2* 7.4* 7.4* 7.7*  --  8.0*  MG  --   < >  --   < > 2.1  --  2.2 2.2  --   --  2.2 2.0  AST 26  --  140*  --   --   --   --   --   --   --   --   --   ALT 14  --  69*  --   --   --   --   --   --   --   --   --   ALKPHOS 80  --  64  --   --   --   --   --   --   --   --   --   BILITOT 0.5  --  0.9  --   --   --   --   --   --   --   --   --   < > = values in this interval not  displayed. ------------------------------------------------------------------------------------------------------------------ No results for input(s): CHOL, HDL, LDLCALC, TRIG, CHOLHDL, LDLDIRECT in the last 72 hours.  Lab Results  Component Value Date   HGBA1C 6.7 (H) 02/24/2017   ------------------------------------------------------------------------------------------------------------------ No results for input(s): TSH, T4TOTAL, T3FREE, THYROIDAB in the last 72 hours.  Invalid input(s): FREET3 ------------------------------------------------------------------------------------------------------------------ No results for input(s): VITAMINB12, FOLATE, FERRITIN, TIBC, IRON, RETICCTPCT in the last 72 hours.  Coagulation profile  Recent Labs Lab 02/24/17 1600  INR 1.36    No results for input(s): DDIMER in the last 72 hours.  Cardiac Enzymes  Recent Labs Lab 02/26/17 0511 02/26/17 1005 02/26/17 1548  TROPONINI 0.67* 0.57* 0.43*   ------------------------------------------------------------------------------------------------------------------ No results found for: BNP  Micro Results Recent Results (from the past 240 hour(s))  Blood Culture (routine x 2)     Status: Abnormal   Collection Time: 02/24/17  4:00 PM  Result Value  Ref Range Status   Specimen Description BLOOD RIGHT ANTECUBITAL  Final   Special Requests   Final    BOTTLES DRAWN AEROBIC AND ANAEROBIC Blood Culture adequate volume   Culture  Setup Time   Final    GRAM POSITIVE COCCI ANAEROBIC BOTTLE ONLY CRITICAL RESULT CALLED TO, READ BACK BY AND VERIFIED WITH: Ferne Coe PHARMD 1839 02/25/17 A BROWNING    Culture (A)  Final    STAPHYLOCOCCUS SPECIES (COAGULASE NEGATIVE) THE SIGNIFICANCE OF ISOLATING THIS ORGANISM FROM A SINGLE SET OF BLOOD CULTURES WHEN MULTIPLE SETS ARE DRAWN IS UNCERTAIN. PLEASE NOTIFY THE MICROBIOLOGY DEPARTMENT WITHIN ONE WEEK IF SPECIATION AND SENSITIVITIES ARE REQUIRED.    Report  Status 02/27/2017 FINAL  Final  Blood Culture ID Panel (Reflexed)     Status: Abnormal   Collection Time: 02/24/17  4:00 PM  Result Value Ref Range Status   Enterococcus species NOT DETECTED NOT DETECTED Final   Listeria monocytogenes NOT DETECTED NOT DETECTED Final   Staphylococcus species DETECTED (A) NOT DETECTED Final    Comment: Methicillin (oxacillin) susceptible coagulase negative staphylococcus. Possible blood culture contaminant (unless isolated from more than one blood culture draw or clinical case suggests pathogenicity). No antibiotic treatment is indicated for blood  culture contaminants. CRITICAL RESULT CALLED TO, READ BACK BY AND VERIFIED WITH: Ferne Coe PHARMD 1839 02/25/17 A BROWNING    Staphylococcus aureus NOT DETECTED NOT DETECTED Final   Methicillin resistance NOT DETECTED NOT DETECTED Final   Streptococcus species NOT DETECTED NOT DETECTED Final   Streptococcus agalactiae NOT DETECTED NOT DETECTED Final   Streptococcus pneumoniae NOT DETECTED NOT DETECTED Final   Streptococcus pyogenes NOT DETECTED NOT DETECTED Final   Acinetobacter baumannii NOT DETECTED NOT DETECTED Final   Enterobacteriaceae species NOT DETECTED NOT DETECTED Final   Enterobacter cloacae complex NOT DETECTED NOT DETECTED Final   Escherichia coli NOT DETECTED NOT DETECTED Final   Klebsiella oxytoca NOT DETECTED NOT DETECTED Final   Klebsiella pneumoniae NOT DETECTED NOT DETECTED Final   Proteus species NOT DETECTED NOT DETECTED Final   Serratia marcescens NOT DETECTED NOT DETECTED Final   Haemophilus influenzae NOT DETECTED NOT DETECTED Final   Neisseria meningitidis NOT DETECTED NOT DETECTED Final   Pseudomonas aeruginosa NOT DETECTED NOT DETECTED Final   Candida albicans NOT DETECTED NOT DETECTED Final   Candida glabrata NOT DETECTED NOT DETECTED Final   Candida krusei NOT DETECTED NOT DETECTED Final   Candida parapsilosis NOT DETECTED NOT DETECTED Final   Candida tropicalis NOT DETECTED NOT  DETECTED Final  Blood Culture (routine x 2)     Status: None   Collection Time: 02/24/17  4:45 PM  Result Value Ref Range Status   Specimen Description BLOOD RIGHT HAND  Final   Special Requests   Final    BOTTLES DRAWN AEROBIC AND ANAEROBIC Blood Culture adequate volume   Culture NO GROWTH 5 DAYS  Final   Report Status 03/01/2017 FINAL  Final  Urine culture     Status: None   Collection Time: 02/24/17  6:36 PM  Result Value Ref Range Status   Specimen Description URINE, CATHETERIZED  Final   Special Requests NONE  Final   Culture NO GROWTH  Final   Report Status 02/26/2017 FINAL  Final  MRSA PCR Screening     Status: Abnormal   Collection Time: 02/24/17 10:00 PM  Result Value Ref Range Status   MRSA by PCR (A) NEGATIVE Final    INVALID, UNABLE TO DETERMINE THE PRESENCE OF TARGET DNA  DUE TO SPECIMEN INTEGRITY. RECOLLECTION REQUESTED.    Comment:        The GeneXpert MRSA Assay (FDA approved for NASAL specimens only), is one component of a comprehensive MRSA colonization surveillance program. It is not intended to diagnose MRSA infection nor to guide or monitor treatment for MRSA infections. CALLED TO V.SMITH,RN AT 0424 02/25/17 BY L.PITT   MRSA culture     Status: None   Collection Time: 02/24/17 10:00 PM  Result Value Ref Range Status   Specimen Description NOSE  Final   Special Requests NONE  Final   Culture NO MRSA DETECTED  Final   Report Status 02/27/2017 FINAL  Final  Culture, respiratory (NON-Expectorated)     Status: None   Collection Time: 02/25/17  8:12 PM  Result Value Ref Range Status   Specimen Description TRACHEAL ASPIRATE  Final   Special Requests NONE  Final   Gram Stain   Final    MODERATE WBC PRESENT,BOTH PMN AND MONONUCLEAR NO ORGANISMS SEEN    Culture RARE METHICILLIN RESISTANT STAPHYLOCOCCUS AUREUS  Final   Report Status 02/28/2017 FINAL  Final   Organism ID, Bacteria METHICILLIN RESISTANT STAPHYLOCOCCUS AUREUS  Final      Susceptibility    Methicillin resistant staphylococcus aureus - MIC*    CIPROFLOXACIN >=8 RESISTANT Resistant     ERYTHROMYCIN <=0.25 SENSITIVE Sensitive     GENTAMICIN <=0.5 SENSITIVE Sensitive     OXACILLIN >=4 RESISTANT Resistant     TETRACYCLINE <=1 SENSITIVE Sensitive     VANCOMYCIN 1 SENSITIVE Sensitive     TRIMETH/SULFA <=10 SENSITIVE Sensitive     CLINDAMYCIN <=0.25 SENSITIVE Sensitive     RIFAMPIN <=0.5 SENSITIVE Sensitive     Inducible Clindamycin NEGATIVE Sensitive     * RARE METHICILLIN RESISTANT STAPHYLOCOCCUS AUREUS    Radiology Reports Ct Abdomen Pelvis Wo Contrast  Result Date: 02/24/2017 CLINICAL DATA:  Epigastric pain EXAM: CT ABDOMEN AND PELVIS WITHOUT CONTRAST TECHNIQUE: Multidetector CT imaging of the abdomen and pelvis was performed following the standard protocol without IV contrast. COMPARISON:  None. FINDINGS: Significant respiratory motion artifact compromises the study. Lower chest: Lung bases demonstrate no acute consolidation or pleural effusion. Borderline heart size. Minimal coronary artery calcification. Hepatobiliary: Calcified granuloma. No biliary dilatation. Status post cholecystectomy. Pancreas: Fuzzy appearance of the pancreas tail is suspected to be related to motion artifact. No ductal dilatation. Spleen: Normal in size without focal abnormality. Adrenals/Urinary Tract: Adrenal glands grossly within normal limits. No hydronephrosis. Bladder within normal limits Stomach/Bowel: Stomach nonenlarged. Small pill fragment. No dilated small bowel. No colon wall thickening. The appendix is slightly enlarged, measuring between 8 and 9 mm, no definitive surrounding inflammatory change. Vascular/Lymphatic: Aortic atherosclerosis. No enlarged abdominal or pelvic lymph nodes. Reproductive: Uterus and bilateral adnexa are unremarkable. Other: No free air or free fluid. Musculoskeletal: Posterior stabilization rods and fixating screws at L2-L3 with laminectomy changes from L2 through L4.  Interbody disc devices at L2-L3, L3-L4 and L4-L5. IMPRESSION: 1. The examination is compromised by respiratory motion artifact. 2. The appendix is slightly enlarged at 8 to 9 mm but no surrounding inflammatory changes. Electronically Signed   By: Donavan Foil M.D.   On: 02/24/2017 18:15   Dg Chest 2 View  Result Date: 02/24/2017 CLINICAL DATA:  Chest pain, slurred speech and fatigue. EXAM: CHEST  2 VIEW COMPARISON:  12/21/2013 and prior radiographs FINDINGS: Cardiomegaly and pulmonary vascular congestion noted. Elevation of the right hemidiaphragm again identified. There is no evidence of focal airspace disease, pulmonary  edema, suspicious pulmonary nodule/mass, pleural effusion, or pneumothorax. No acute bony abnormalities are identified. IMPRESSION: Cardiomegaly without evidence of acute cardiopulmonary disease. Electronically Signed   By: Margarette Canada M.D.   On: 02/24/2017 16:27   Ct Head Wo Contrast  Result Date: 02/24/2017 CLINICAL DATA:  Left-sided weakness. EXAM: CT HEAD WITHOUT CONTRAST TECHNIQUE: Contiguous axial images were obtained from the base of the skull through the vertex without intravenous contrast. COMPARISON:  None. FINDINGS: Brain: There is no evidence of acute infarct, intracranial hemorrhage, mass, midline shift, or extra-axial fluid collection. The ventricles and sulci are normal. Incidental dural calcification along the sphenoid wings. Vascular: Calcified atherosclerosis at the skullbase. No hyperdense vessel. Skull: No fracture or focal osseous lesion. Sinuses/Orbits: Visualized paranasal sinuses and mastoid air cells are clear. Bilateral cataract extraction is noted. Other: None. IMPRESSION: Unremarkable CT appearance of the brain. Electronically Signed   By: Logan Bores M.D.   On: 02/24/2017 16:45   US Renal  Result Date: 02/25/2017 CLINICAL DATA:  Acute kidney injury. EXAM: RENAL / URINARY TRACT ULTRASOUND COMPLETE COMPARISON:  CT 02/24/2017 FINDINGS: Right Kidney: Length:  10.8 cm. Echogenicity within normal limits. No mass or hydronephrosis visualized. Left Kidney: Length: 10.9 cm. Echogenicity within normal limits. No mass or hydronephrosis visualized. Bladder: Decompressed by Foley catheter and not well evaluated. IMPRESSION: Unremarkable renal ultrasound.  No obstructive uropathy. Urinary bladder decompressed by Foley catheter and not evaluated. Electronically Signed   By: Jeb Levering M.D.   On: 02/25/2017 01:59   Dg Chest Port 1 View  Result Date: 03/01/2017 CLINICAL DATA:  Acute respiratory failure. EXAM: PORTABLE CHEST 1 VIEW COMPARISON:  02/27/2017 FINDINGS: Right jugular catheter terminates over the mid SVC. Endotracheal and enteric tubes have been removed. The cardiomediastinal silhouette is unchanged. Lung volumes are increased with improved aeration of the lung bases. Minimal right and mild left basilar opacity remain. No sizable pleural effusion or pneumothorax is identified. IMPRESSION: Interval extubation with improved aeration bilaterally. Mild residual left greater than right basilar atelectasis. Electronically Signed   By: Logan Bores M.D.   On: 03/01/2017 07:30   Dg Chest Port 1 View  Result Date: 02/27/2017 CLINICAL DATA:  Endotracheal tube in place. EXAM: PORTABLE CHEST 1 VIEW COMPARISON:  One-view chest x-ray 02/26/2017 FINDINGS: The heart size is exaggerated by low lung volumes. Endotracheal tube remains low, 1.8 cm above the carina. The side port of the NG tube tip is above the GE junction. A right IJ line terminates in the SVC. Moderate pulmonary vascular congestion is present. Lung volumes have decreased slightly. Bibasilar airspace disease likely reflects atelectasis. IMPRESSION: 1. Endotracheal tube remains low, 1.8 cm above the carina. 2. Support apparatus is otherwise stable. The side port of the NG tube is above the GE junction. 3. Slight decrease in lung volumes with residual bibasilar atelectasis. Electronically Signed   By: San Morelle M.D.   On: 02/27/2017 07:40   Dg Chest Port 1 View  Result Date: 02/26/2017 CLINICAL DATA:  Respiratory failure. EXAM: PORTABLE CHEST 1 VIEW COMPARISON:  02/25/2017 FINDINGS: An endotracheal tube 2.1 cm above the carina, right IJ central venous catheter with tip overlying the mid SVC, and NG tube entering the stomach again identified. This is a mildly low volume film. Cardiomegaly, mild pulmonary vascular congestion and mild bibasilar atelectasis again noted. There is no evidence of pneumothorax or pleural effusion. IMPRESSION: Little significant change since the prior study with cardiomegaly, mild pulmonary vascular congestion and mild bibasilar atelectasis. Electronically Signed  By: Margarette Canada M.D.   On: 02/26/2017 07:05   Dg Chest Port 1 View  Result Date: 02/25/2017 CLINICAL DATA:  Respiratory failure. EXAM: PORTABLE CHEST 1 VIEW COMPARISON:  02/24/2017. FINDINGS: Patient rotated to the right. Endotracheal tube, NG tube, right IJ line in stable position. Cardiomegaly. Interim slight improvement of pulmonary vascular congestion and interstitial prominence. Small left pleural effusion. No pneumothorax. Postsurgical changes both shoulders. IMPRESSION: 1. Lines and tubes in stable position. 2. Cardiomegaly with interim slight improvement pulmonary vascular congestion and pulmonary interstitial edema. Small left pleural effusion. Electronically Signed   By: Marcello Moores  Register   On: 02/25/2017 06:26   Dg Chest Port 1 View  Result Date: 02/24/2017 CLINICAL DATA:  ET tube, OG and line placement EXAM: PORTABLE CHEST 1 VIEW COMPARISON:  02/24/2017 FINDINGS: Insertion of right-sided central venous catheter, the tip projects over distal SVC. No right pneumothorax. Interval intubation, tip of the endotracheal tube is 1.8 cm superior to the carina. Esophageal tube tip is below the diaphragm. Possible ascending catheter with the tip positioned over the right atrial IVC junction. Stable cardiomegaly.  Development of central vascular congestion and hazy asymmetric edema or infiltrate in the left chest. IMPRESSION: 1. Placement of support lines and tubes as above. Endotracheal tube tip approximately 1.8 cm superior to carina 2. Cardiomegaly. Development of central vascular congestion and asymmetric hazy opacity in the left thorax which may reflect asymmetric edema or infiltrate. Electronically Signed   By: Donavan Foil M.D.   On: 02/24/2017 23:38   Dg Abd Portable 1v  Result Date: 02/24/2017 CLINICAL DATA:  OG placement EXAM: PORTABLE ABDOMEN - 1 VIEW COMPARISON:  CT 02/24/2017 FINDINGS: Numerous support leads limit the exam. Esophageal tube tip overlies proximal stomach, side-port probably in the region of GE junction. Relatively gasless abdomen. Surgical hardware in the spine. IMPRESSION: Esophageal tube tip overlies proximal stomach, side-port probably at the GE junction, further advancement is suggested for more optimal positioning Electronically Signed   By: Donavan Foil M.D.   On: 02/24/2017 23:30    Time Spent in minutes  30   Lala Lund M.D on 03/01/2017 at 10:05 AM  Between 7am to 7pm - Pager - [phone] ( page via [email or web address], text pages only, please mention full 10 digit call back number). After 7pm go to [email or web address] - password [facility]

## 2017-03-01 NOTE — Progress Notes (Signed)
Patients B/P is 110/46 and HR is 79. MD notified. Md stated to give atenolol. Orders followed. Will continue to monitor.

## 2017-03-01 NOTE — Progress Notes (Signed)
Nutrition Follow-up  DOCUMENTATION CODES:   Obesity unspecified  INTERVENTION:  Encourage adequate PO intake.   RD to continue to monitor.   NUTRITION DIAGNOSIS:   Inadequate oral intake related to inability to eat as evidenced by NPO status; diet advanced, po 90-95%; improved  GOAL:   Patient will meet greater than or equal to 90% of their needs; met  MONITOR:   PO intake, Labs, Weight trends, Skin, I & O's  REASON FOR ASSESSMENT:   Consult Enteral/tube feeding initiation and management  ASSESSMENT:   71 y.o. female admitted 5/16 with acute renal failure, dehydration, and concern for sepsis.  Given multiple metabolic derangements, she was admitted to ICU. She was intubated for respiratory failure.  Extubated 5/20. Pt with no dialysis since 5/19. Pt reports having a good appetite currently and PTA with usual consumption of at least 3 meals a day with no other difficulties. Meal completion has been 90-95%. Pt encouraged to eat her food at meals.   Labs and medications reviewed. Phosphorous elevated at 4.7.  Diet Order:  Diet renal/carb modified with fluid restriction Diet-HS Snack? Nothing; Room service appropriate? Yes; Fluid consistency: Thin  Skin:  Reviewed, no issues  Last BM:  5/20  Height:   Ht Readings from Last 1 Encounters:  02/25/17 _0  (1.6 m)    Weight:   Wt Readings from Last 1 Encounters:  03/01/17 220 lb 3.8 oz (99.9 kg)    Ideal Body Weight:  52.3 kg  BMI:  Body mass index is 39.01 kg/m.  Estimated Nutritional Needs:   Kcal:  1700-1850  Protein:  90-100 grams  Fluid:  Per MD  EDUCATION NEEDS:   No education needs identified at this time  Corrin Parker, MS, RD, LDN Pager # [phone] After hours/ weekend pager # [phone]

## 2017-03-01 NOTE — Progress Notes (Signed)
Patient is complaining of pain in her neck and right elbow. Currently no PRN orders placed. MD notified. Will continue to monitor.

## 2017-03-01 NOTE — Evaluation (Signed)
Physical Therapy Evaluation Patient Details Name: [name] [name] MRN: [id number] DOB: October 16, 1945 Today's Date: 03/01/2017   History of Present Illness  Pt is a 71 yo F brought in by daughter from home on 02/24/17 due to c/o abdominal pain, vomitting, diarrhea, slurred speech, and inability to walk. Pt was admitted to critical care, but is now being seen by internal medicine. Pt currently suffers from acute renal failure, CHF, DM type II with diabetic neuropathy, obesity, and chronic pain syndrome in R elbow, lumbar spine, and neck. PMH: depression, dyslipidema, gallstones, GERD, HTN, PVD, s/p: lumbar fusion, rotator cuff surgery, cholecytectomy, L heart cath. PLOF: independent at home using a quad cane or rw living with a friend  Clinical Impression  Admitted with above diagnosis; Prior to arrival, pt was living at home using a quad cane or rw to ambulate around home and community. Presents with functional deficits resulting from impairments listed below. Furthermore, pt's hemoglobin was at 7.0, however pt moved well during today's session. Pt was able to ambulate to the bathroom to void and returned to chair w/o mild c/o dizziness. Pt was using a nasal cannula with 2 L of O2, but states that she does not on any O2 at home. Pt will benefit from skilled therapy to maximize independence and safety with mobility to return home.    Follow Up Recommendations Home health PT;Supervision/Assistance - 24 hour    Equipment Recommendations  None recommended by PT    Recommendations for Other Services       Precautions / Restrictions Precautions Precautions: Fall Precaution Comments: SOB, dizziness Restrictions Weight Bearing Restrictions: No      Mobility  Bed Mobility Overal bed mobility: Modified Independent             General bed mobility comments: Pt was able to mobilize from supine to sit and rolling L<>R while using the guard rails independently  Transfers Overall transfer level:  Needs assistance Equipment used: Rolling walker (2 wheeled) Transfers: Sit to/from [facility] Sit to Stand: Min guard       General transfer comment: Pt reports feeling mild dizziness and lightheaded while transferring from sit to stand  Ambulation/Gait Ambulation/Gait assistance: Min guard Ambulation Distance (Feet): 20 Feet Assistive device: Rolling walker (2 wheeled) Gait Pattern/deviations: Shuffle;Wide base of support   Gait velocity interpretation: Below normal speed for age/gender   Pt required vc for optimized breathing sequence to combat dizziness; cued to self monitor for activity tolerance Stairs            Wheelchair Mobility    Modified Rankin (Stroke Patients Only)       Balance Overall balance assessment: No apparent balance deficits (not formally assessed)                                           Pertinent Vitals/Pain Pain Assessment: No/denies pain    Home Living Family/patient expects to be discharged to:: Private residence Living Arrangements: Non-relatives/Friends Available Help at Discharge: Family;Friend(s) Type of Home: House Home Access: Stairs to enter Entrance Stairs-Rails: Left Entrance Stairs-Number of Steps: 3 Home Layout: One level Home Equipment: Walker - 2 wheels;Cane - quad;Bedside commode;Shower seat;Grab bars - toilet      Prior Function Level of Independence: Independent with assistive device(s)               Hand Dominance  Extremity/Trunk Assessment   Upper Extremity Assessment Upper Extremity Assessment: Overall WFL for tasks assessed    Lower Extremity Assessment Lower Extremity Assessment: Generalized weakness    Cervical / Trunk Assessment Cervical / Trunk Assessment: Normal  Communication   Communication: No difficulties  Cognition Arousal/Alertness: Awake/alert Behavior During Therapy: WFL for tasks assessed/performed Overall Cognitive Status: Within  Functional Limits for tasks assessed                                 General Comments: pt is fully awake/alert      General Comments      Exercises     Assessment/Plan    PT Assessment Patient needs continued PT services  PT Problem List Decreased strength;Decreased activity tolerance;Obesity       PT Treatment Interventions Gait training;Stair training;Functional mobility training;Therapeutic activities;Therapeutic exercise;Balance training;Neuromuscular re-education;Patient/family education    PT Goals (Current goals can be found in the Care Plan section)  Acute Rehab PT Goals Patient Stated Goal: to go home PT Goal Formulation: With patient/family Time For Goal Achievement: 03/15/17 Potential to Achieve Goals: Fair    Frequency Min 3X/week   Barriers to discharge        Co-evaluation               AM-PAC PT "6 Clicks" Daily Activity  Outcome Measure Difficulty turning over in bed (including adjusting bedclothes, sheets and blankets)?: A Little Difficulty moving from lying on back to sitting on the side of the bed? : A Little Difficulty sitting down on and standing up from a chair with arms (e.g., wheelchair, bedside commode, etc,.)?: A Little Help needed moving to and from a bed to chair (including a wheelchair)?: A Little Help needed walking in hospital room?: A Little Help needed climbing 3-5 steps with a railing? : A Little 6 Click Score: 18    End of Session Equipment Utilized During Treatment: Gait belt;Oxygen Activity Tolerance: Patient tolerated treatment well;No increased pain (pt did c/o dizziness) Patient left: in chair;with call bell/phone within reach;with family/visitor present Nurse Communication: Mobility status PT Visit Diagnosis: Muscle weakness (generalized) (M62.81)    Time:  4356-8616 PT Time Calculation (min) (ACUTE ONLY): 23 min   Charges:   PT Evaluation $PT Eval Low Complexity: 1 Procedure PT Treatments $Gait  Training: 8-22 mins   PT G CodesMeda Coffee, SPT [phone]   Eliezer Lofts Tab Rylee 03/01/2017, 4:38 PM

## 2017-03-02 DIAGNOSIS — M25521 Pain in right elbow: Secondary | ICD-10-CM

## 2017-03-02 LAB — TYPE AND SCREEN
ABO/RH(D): O POS
Antibody Screen: NEGATIVE
Unit division: 0

## 2017-03-02 LAB — MAGNESIUM: Magnesium: 1.7 mg/dL (ref 1.7–2.4)

## 2017-03-02 LAB — RENAL FUNCTION PANEL
Albumin: 2.5 g/dL — ABNORMAL LOW (ref 3.5–5.0)
Anion gap: 11 (ref 5–15)
BUN: 36 mg/dL — ABNORMAL HIGH (ref 6–20)
CO2: 26 mmol/L (ref 22–32)
Calcium: 8.5 mg/dL — ABNORMAL LOW (ref 8.9–10.3)
Chloride: 100 mmol/L — ABNORMAL LOW (ref 101–111)
Creatinine, Ser: 4.62 mg/dL — ABNORMAL HIGH (ref 0.44–1.00)
GFR calc Af Amer: 10 mL/min — ABNORMAL LOW (ref >60)
GFR calc non Af Amer: 9 mL/min — ABNORMAL LOW (ref >60)
Glucose, Bld: 130 mg/dL — ABNORMAL HIGH (ref 65–99)
Phosphorus: 5.4 mg/dL — ABNORMAL HIGH (ref 2.5–4.6)
Potassium: 4 mmol/L (ref 3.5–5.1)
Sodium: 137 mmol/L (ref 135–145)

## 2017-03-02 LAB — BPAM RBC
Blood Product Expiration Date: 201806142359
ISSUE DATE / TIME: 201805211541
Unit Type and Rh: 5100

## 2017-03-02 LAB — GLUCOSE, CAPILLARY
Glucose-Capillary: 111 mg/dL — ABNORMAL HIGH (ref 65–99)
Glucose-Capillary: 225 mg/dL — ABNORMAL HIGH (ref 65–99)
Glucose-Capillary: 189 mg/dL — ABNORMAL HIGH (ref 65–99)
Glucose-Capillary: 240 mg/dL — ABNORMAL HIGH (ref 65–99)

## 2017-03-02 LAB — APTT: aPTT: 49 seconds — ABNORMAL HIGH (ref 24–36)

## 2017-03-02 NOTE — Progress Notes (Signed)
[facility] ROUNDING NOTE   Subjective:   Interval History: Pt is a 71 y.o.yo femalewith DM and HTN but creatinine of 1.1 on 3/1/18who was admitted on 5/16/2018with seems like sepsis syndrome with AKI- severe acidosis and hyperkalemia- home meds included ACE, NSAID and potassium  She required brief support with CRRT and is now making urine  No dialysis since 5/19  Objective:  Vital signs in last 24 hours:  Temp:  [98 F (36.7 C)-98.8 F (37.1 C)] 98 F (36.7 C) (05/22 0450) Pulse Rate:  [63-85] 63 (05/22 0450) Resp:  [16-18] 16 (05/22 0450) BP: (108-130)/(46-68) 125/49 (05/22 0450) SpO2:  [94 %-100 %] 99 % (05/22 0450) Weight:  [217 lb 13 oz (98.8 kg)] 217 lb 13 oz (98.8 kg) (05/21 2207)  Weight change: -2 lb 6.8 oz (-1.1 kg) Filed Weights   02/28/17 0340 03/01/17 0500 03/01/17 2207  Weight: 220 lb 3.8 oz (99.9 kg) 220 lb 3.8 oz (99.9 kg) 217 lb 13 oz (98.8 kg)    Intake/Output: I/O last 3 completed shifts: In: 2661.3 [P.O.:1560; I.V.:260; Blood:310; IV Piggyback:531.3] Out: 550 [Urine:550]   Intake/Output this shift:  No intake/output data recorded.  CVS- RRR RS- CTA ABD- BS present soft non-distended EXT- no edema   Basic Metabolic Panel:  Recent Labs Lab 02/26/17 0511  02/26/17 1548 02/27/17 0323 02/27/17 1600 02/28/17 0345 02/28/17 0346 03/01/17 0525  NA 136  < > 137 137 137 139  --  135  K 3.5  < > 4.2 3.9 3.5 3.4*  --  3.9  CL 98*  < > 102 101 102 102  --  97*  CO2 26  < > 26 27 27 28   --  26  GLUCOSE 186*  < > 206* 222* 225* 118*  --  273*  BUN 20  < > 12 12 19  21*  --  30*  CREATININE 2.03*  < > 1.42* 1.26* 2.09* 2.87*  --  3.91*  CALCIUM 7.2*  < > 7.2* 7.4* 7.4* 7.7*  --  8.0*  MG 2.1  --  2.2 2.2  --   --  2.2 2.0  PHOS 1.5*  1.6*  --  2.6  2.7 1.4* 4.9* 5.7*  --  4.7*  < > = values in this interval not displayed.  Liver Function Tests:  Recent Labs Lab 02/24/17 1600 02/24/17 2318  02/26/17 1548 02/27/17 0323  02/27/17 1600 02/28/17 0345 03/01/17 0525  AST 26 140*  --   --   --   --   --   --   ALT 14 69*  --   --   --   --   --   --   ALKPHOS 80 64  --   --   --   --   --   --   BILITOT 0.5 0.9  --   --   --   --   --   --   PROT 7.5 5.4*  --   --   --   --   --   --   ALBUMIN 3.0* 2.0*  < > 2.4* 2.2* 2.1* 2.0* 2.2*  < > = values in this interval not displayed.  Recent Labs Lab 02/24/17 1600  LIPASE 205*   No results for input(s): AMMONIA in the last 168 hours.  CBC:  Recent Labs Lab 02/24/17 1600  02/25/17 0310 02/25/17 2107 02/26/17 0511 02/27/17 0323 03/01/17 0525 03/01/17 2101  WBC 27.8*  --  30.3*  --  8.8 9.8 9.8  --   NEUTROABS 22.3*  --   --   --   --   --   --   --   HGB 10.2*  < > 9.3* 8.2* 8.8* 8.0* 7.0* 8.8*  HCT 31.2*  < > 26.9* 24.0* 24.8* 23.0* 20.3* 25.3*  MCV 86.2  --  82.3  --  79.5 80.4 81.2  --   PLT 419*  --  342  --  281 265 249  --   < > = values in this interval not displayed.  Cardiac Enzymes:  Recent Labs Lab 02/24/17 1916  02/25/17 1748 02/25/17 2309 02/26/17 0511 02/26/17 1005 02/26/17 1548  CKTOTAL 62  --   --   --   --   --   --   TROPONINI  --   < > 0.86* 0.81* 0.67* 0.57* 0.43*  < > = values in this interval not displayed.  BNP: Invalid input(s): POCBNP  CBG:  Recent Labs Lab 03/01/17 0808 03/01/17 1200 03/01/17 1627 03/01/17 2206 03/02/17 0747  GLUCAP 282* 183* 179* 144* 111*    Microbiology: Results for orders placed or performed during the hospital encounter of 02/24/17  Blood Culture (routine x 2)     Status: Abnormal   Collection Time: 02/24/17  4:00 PM  Result Value Ref Range Status   Specimen Description BLOOD RIGHT ANTECUBITAL  Final   Special Requests   Final    BOTTLES DRAWN AEROBIC AND ANAEROBIC Blood Culture adequate volume   Culture  Setup Time   Final    GRAM POSITIVE COCCI ANAEROBIC BOTTLE ONLY CRITICAL RESULT CALLED TO, READ BACK BY AND VERIFIED WITH: Ferne Coe PHARMD [id number] 02/25/17 A BROWNING     Culture (A)  Final    STAPHYLOCOCCUS SPECIES (COAGULASE NEGATIVE) THE SIGNIFICANCE OF ISOLATING THIS ORGANISM FROM A SINGLE SET OF BLOOD CULTURES WHEN MULTIPLE SETS ARE DRAWN IS UNCERTAIN. PLEASE NOTIFY THE MICROBIOLOGY DEPARTMENT WITHIN ONE WEEK IF SPECIATION AND SENSITIVITIES ARE REQUIRED.    Report Status 02/27/2017 FINAL  Final  Blood Culture ID Panel (Reflexed)     Status: Abnormal   Collection Time: 02/24/17  4:00 PM  Result Value Ref Range Status   Enterococcus species NOT DETECTED NOT DETECTED Final   Listeria monocytogenes NOT DETECTED NOT DETECTED Final   Staphylococcus species DETECTED (A) NOT DETECTED Final    Comment: Methicillin (oxacillin) susceptible coagulase negative staphylococcus. Possible blood culture contaminant (unless isolated from more than one blood culture draw or clinical case suggests pathogenicity). No antibiotic treatment is indicated for blood  culture contaminants. CRITICAL RESULT CALLED TO, READ BACK BY AND VERIFIED WITH: Ferne Coe PHARMD [id number] 02/25/17 A BROWNING    Staphylococcus aureus NOT DETECTED NOT DETECTED Final   Methicillin resistance NOT DETECTED NOT DETECTED Final   Streptococcus species NOT DETECTED NOT DETECTED Final   Streptococcus agalactiae NOT DETECTED NOT DETECTED Final   Streptococcus pneumoniae NOT DETECTED NOT DETECTED Final   Streptococcus pyogenes NOT DETECTED NOT DETECTED Final   Acinetobacter baumannii NOT DETECTED NOT DETECTED Final   Enterobacteriaceae species NOT DETECTED NOT DETECTED Final   Enterobacter cloacae complex NOT DETECTED NOT DETECTED Final   Escherichia coli NOT DETECTED NOT DETECTED Final   Klebsiella oxytoca NOT DETECTED NOT DETECTED Final   Klebsiella pneumoniae NOT DETECTED NOT DETECTED Final   Proteus species NOT DETECTED NOT DETECTED Final   Serratia marcescens NOT DETECTED NOT DETECTED Final   Haemophilus influenzae NOT DETECTED NOT DETECTED Final   Neisseria meningitidis  NOT DETECTED NOT DETECTED Final    Pseudomonas aeruginosa NOT DETECTED NOT DETECTED Final   Candida albicans NOT DETECTED NOT DETECTED Final   Candida glabrata NOT DETECTED NOT DETECTED Final   Candida krusei NOT DETECTED NOT DETECTED Final   Candida parapsilosis NOT DETECTED NOT DETECTED Final   Candida tropicalis NOT DETECTED NOT DETECTED Final  Blood Culture (routine x 2)     Status: None   Collection Time: 02/24/17  4:45 PM  Result Value Ref Range Status   Specimen Description BLOOD RIGHT HAND  Final   Special Requests   Final    BOTTLES DRAWN AEROBIC AND ANAEROBIC Blood Culture adequate volume   Culture NO GROWTH 5 DAYS  Final   Report Status 03/01/2017 FINAL  Final  Urine culture     Status: None   Collection Time: 02/24/17  6:36 PM  Result Value Ref Range Status   Specimen Description URINE, CATHETERIZED  Final   Special Requests NONE  Final   Culture NO GROWTH  Final   Report Status 02/26/2017 FINAL  Final  MRSA PCR Screening     Status: Abnormal   Collection Time: 02/24/17 10:00 PM  Result Value Ref Range Status   MRSA by PCR (A) NEGATIVE Final    INVALID, UNABLE TO DETERMINE THE PRESENCE OF TARGET DNA DUE TO SPECIMEN INTEGRITY. RECOLLECTION REQUESTED.    Comment:        The GeneXpert MRSA Assay (FDA approved for NASAL specimens only), is one component of a comprehensive MRSA colonization surveillance program. It is not intended to diagnose MRSA infection nor to guide or monitor treatment for MRSA infections. CALLED TO V.SMITH,RN AT 0424 02/25/17 BY L.PITT   MRSA culture     Status: None   Collection Time: 02/24/17 10:00 PM  Result Value Ref Range Status   Specimen Description NOSE  Final   Special Requests NONE  Final   Culture NO MRSA DETECTED  Final   Report Status 02/27/2017 FINAL  Final  Culture, respiratory (NON-Expectorated)     Status: None   Collection Time: 02/25/17  8:12 PM  Result Value Ref Range Status   Specimen Description TRACHEAL ASPIRATE  Final   Special Requests NONE  Final    Gram Stain   Final    MODERATE WBC PRESENT,BOTH PMN AND MONONUCLEAR NO ORGANISMS SEEN    Culture RARE METHICILLIN RESISTANT STAPHYLOCOCCUS AUREUS  Final   Report Status 02/28/2017 FINAL  Final   Organism ID, Bacteria METHICILLIN RESISTANT STAPHYLOCOCCUS AUREUS  Final      Susceptibility   Methicillin resistant staphylococcus aureus - MIC*    CIPROFLOXACIN >=8 RESISTANT Resistant     ERYTHROMYCIN <=0.25 SENSITIVE Sensitive     GENTAMICIN <=0.5 SENSITIVE Sensitive     OXACILLIN >=4 RESISTANT Resistant     TETRACYCLINE <=1 SENSITIVE Sensitive     VANCOMYCIN 1 SENSITIVE Sensitive     TRIMETH/SULFA <=10 SENSITIVE Sensitive     CLINDAMYCIN <=0.25 SENSITIVE Sensitive     RIFAMPIN <=0.5 SENSITIVE Sensitive     Inducible Clindamycin NEGATIVE Sensitive     * RARE METHICILLIN RESISTANT STAPHYLOCOCCUS AUREUS    Coagulation Studies: No results for input(s): LABPROT, INR in the last 72 hours.  Urinalysis: No results for input(s): COLORURINE, LABSPEC, PHURINE, GLUCOSEU, HGBUR, BILIRUBINUR, KETONESUR, PROTEINUR, UROBILINOGEN, NITRITE, LEUKOCYTESUR in the last 72 hours.  Invalid input(s): APPERANCEUR    Imaging: Dg Elbow Complete Right (3+view)  Result Date: 03/01/2017 CLINICAL DATA:  Right elbow pain. EXAM: RIGHT ELBOW - COMPLETE  3+ VIEW COMPARISON:  02/09/2008 FINDINGS: There is no evidence of fracture, dislocation, or joint effusion. There is no evidence of arthropathy or other focal bone abnormality. Soft tissues are unremarkable. IMPRESSION: Negative. Electronically Signed   By: Lorriane Shire M.D.   On: 03/01/2017 13:21   Dg Chest Port 1 View  Result Date: 03/01/2017 CLINICAL DATA:  Acute respiratory failure. EXAM: PORTABLE CHEST 1 VIEW COMPARISON:  02/27/2017 FINDINGS: Right jugular catheter terminates over the mid SVC. Endotracheal and enteric tubes have been removed. The cardiomediastinal silhouette is unchanged. Lung volumes are increased with improved aeration of the lung bases.  Minimal right and mild left basilar opacity remain. No sizable pleural effusion or pneumothorax is identified. IMPRESSION: Interval extubation with improved aeration bilaterally. Mild residual left greater than right basilar atelectasis. Electronically Signed   By: Logan Bores M.D.   On: 03/01/2017 07:30     Medications:   . sodium chloride 250 mL (02/28/17 0400)   . aspirin  81 mg Per Tube Daily  . atenolol  25 mg Oral Daily  . atorvastatin  10 mg Oral q1800  . doxycycline  100 mg Oral Q12H  . heparin  5,000 Units Subcutaneous Q8H  . insulin aspart  0-15 Units Subcutaneous TID AC & HS  . insulin glargine  15 Units Subcutaneous Daily  . isosorbide mononitrate  30 mg Oral Daily  . mouth rinse  15 mL Mouth Rinse BID  . pantoprazole  40 mg Oral Daily   sodium chloride, HYDROcodone-acetaminophen, ondansetron (ZOFRAN) IV, sodium chloride flush  Assessment/ Plan:  1. Renal-AKI in the setting of extreme hypotension on ACE/NSAID- U/A pretty bland- CT no obstruction. Suspect ATN. RequiredCRRT due to hemodynamic instability/acidosis and hyperkalemia- using heparin with - started overnight 5/16---- stopped 5/19 AM. Will continue to observe Creatinine pending with good urine output - no dialysis needs  2. HTN/vol- euvolemic and controlled 3. Anemia-hgb 9.3--8.8--8.0 appears stablewatch for now- transfuse as needed 4. Metabolic acidosis- resolved  5. Hyperkalemia-corrected  6. Sepsis? - on zosyn/vanc- CT negative for source- per CCM- now on  doxycyline 7. Hypophos- resolved    LOS: 6 [name] [name] W @TODAY @8 :39 AM

## 2017-03-02 NOTE — Progress Notes (Addendum)
@IPLOG         PROGRESS NOTE                                                                                                                                                                                                             Patient Demographics:    [name] [name], is a 71 y.o. female, DOB - [birth year], QBV:[id number]  Admit date - 02/24/2017   Admitting Physician Juanito Doom, MD  Outpatient Primary MD for the patient is Bartholomew Crews, MD  LOS - 6  Chief Complaint  Patient presents with  . Fatigue       Brief Narrative   71 yr female with long term DM, HTN according to family.  Hx CAD with Cath 2800, diastolic CHF but EF >34%, DJD, Cervical stenosis, lumbar disc dz, DM neuropathy, dyslipidemia, obesity & depression who was admitted on 02/24/2017 by pulmonary critical care due to generalized weakness, metabolic encephalopathy with hypovolemic shock, acute renal failure, she required endotracheal intubation due to encephalopathy and failure to protect airway. She was extubated on 02/28/2017 and transferred to my care.   Subjective:   Patient in bed, appears comfortable, denies any headache, no fever, no chest pain or pressure, no shortness of breath , no abdominal pain. No focal weakness. Stable chronic neck and right elbow pain.    Assessment  & Plan :     1.Acute renal failure. He required CRRT, has a right IJ dialysis catheter, nephrology following, cause of acute renal failure thought to be dehydration with concurrent Lasix, ACE and NSAID use - he is currently been managed medically by nephrology, further dialysis on hold for the last few days, she is getting intermittent oral Lasix per nephrology. Renal ultrasound was stable. Unfortunately her renal function seems to be declining again. For now we will leave the dialysis catheter in and defer management to nephrology.  2. Contamination with 1 out of 2 coag-negative staph, possible mild bronchitis with few staph  aureus in the sputum. No evidence of ongoing pneumonia, had de-escalate antibiotics to doxycycline oral on 03/01/2017, continues to be stable, no productive cough or shortness of breath. Clinically currently no pneumonia or sepsis.   3. CAD - no chest pain, mild troponin rise time of admission likely due to demand ischemia from dehydration in the setting of severe acute renal failure and elevated creatinine. Echo was stable no wall motion abnormality, troponin trend was flat and in non-ACS pattern. She is currently on aspirin-beta blocker and statin along with Imdur for secondary  prevention which will be continued.  4. Chronic neck pain, right elbow pain, history of DJD and L-spine surgery. Supportive care, is stable, had checked a right elbow x-ray which is unremarkable as well.  5. Chronic deconditioning and weakness. PT eval, may require SNF, social work informed on 03/01/2017.  6. Anemia of chronic disease with some drop in H&H due to acute illness and multiple blood draws. Had transfused 1 unit of packed RBC on 03/01/2017 , posttransfusion H&H remain stable, no evidence of ongoing acute blood loss, placed on PPI.  7. Metabolic encephalopathy requiring endotracheal intubation for airway protection. All resolved back to baseline. Head CT unremarkable.  8. Dyslipidemia. On statin continue.  9. Hypertension. Stable on beta blocker and Imdur.  10. Chronic diastolic CHF EF around 67%. Compensated.  11. DM2 - Lantus + ISS, increase Lantus  Lab Results  Component Value Date   HGBA1C 6.7 (H) 02/24/2017   CBG (last 3)   Recent Labs  03/01/17 1627 03/01/17 2206 03/02/17 0747  GLUCAP 179* 144* 111*      Diet : Diet renal/carb modified with fluid restriction Diet-HS Snack? Nothing; Room service appropriate? Yes; Fluid consistency: Thin    Family Communication  :  None  Code Status :  Full  Disposition Plan  :  TBD may need SNF  Consults  :  PCCM, Renal  Procedures  :    CT  head 5/16 > negative. CT A / P 5/16 > appendix slightly enlarged but no surrounding inflammatory changes. CXR 5/21 > mild bilateral bases Echo 5/17 > Vigorous LV systolic function; mild diastolic dysfunction;sclerotic aortic valve with mild AI; mild MR; mild TR with mildlyelevated pulmonary pressure. Renal US 5/16 > Unremarkable Needed endotracheal intubation on the day of admission extubated 02/28/2017  DVT Prophylaxis  :   Heparin   Lab Results  Component Value Date   PLT 249 03/01/2017    Inpatient Medications  Scheduled Meds: . aspirin  81 mg Per Tube Daily  . atenolol  25 mg Oral Daily  . atorvastatin  10 mg Oral q1800  . doxycycline  100 mg Oral Q12H  . heparin  5,000 Units Subcutaneous Q8H  . insulin aspart  0-15 Units Subcutaneous TID AC & HS  . insulin glargine  15 Units Subcutaneous Daily  . isosorbide mononitrate  30 mg Oral Daily  . mouth rinse  15 mL Mouth Rinse BID  . pantoprazole  40 mg Oral Daily   Continuous Infusions: . sodium chloride 250 mL (02/28/17 0400)   PRN Meds:.sodium chloride, HYDROcodone-acetaminophen, ondansetron (ZOFRAN) IV, sodium chloride flush  Antibiotics  :    Anti-infectives    Start     Dose/Rate Route Frequency Ordered Stop   03/01/17 2300  sulfamethoxazole-trimethoprim (BACTRIM) 500 mg of trimethoprim in dextrose 5 % 500 mL IVPB  Status:  Discontinued     500 mg of trimethoprim 354.2 mL/hr over 90 Minutes Intravenous Every 24 hours 03/01/17 0833 03/01/17 0850   03/01/17 1000  doxycycline (VIBRA-TABS) tablet 100 mg     100 mg Oral Every 12 hours 03/01/17 0850     02/28/17 1030  sulfamethoxazole-trimethoprim (BACTRIM) 500 mg of trimethoprim in dextrose 5 % 500 mL IVPB  Status:  Discontinued     500 mg of trimethoprim 354.2 mL/hr over 90 Minutes Intravenous Every 12 hours 02/28/17 0953 03/01/17 0833   02/26/17 1200  cefTRIAXone (ROCEPHIN) 1 g in dextrose 5 % 50 mL IVPB  Status:  Discontinued     1  g 100 mL/hr over 30 Minutes  Intravenous Every 24 hours 02/26/17 1048 02/28/17 0939   02/25/17 1730  vancomycin (VANCOCIN) IVPB 1000 mg/200 mL premix  Status:  Discontinued     1,000 mg 200 mL/hr over 60 Minutes Intravenous Every 24 hours 02/24/17 2140 02/26/17 1048   02/24/17 2300  piperacillin-tazobactam (ZOSYN) IVPB 3.375 g  Status:  Discontinued     3.375 g 100 mL/hr over 30 Minutes Intravenous Every 6 hours 02/24/17 2140 02/26/17 1048   02/24/17 2200  piperacillin-tazobactam (ZOSYN) IVPB 2.25 g  Status:  Discontinued     2.25 g 100 mL/hr over 30 Minutes Intravenous Every 8 hours 02/24/17 1710 02/24/17 2140   02/24/17 1630  vancomycin (VANCOCIN) 2,000 mg in sodium chloride 0.9 % 500 mL IVPB     2,000 mg 250 mL/hr over 120 Minutes Intravenous  Once 02/24/17 1618 02/24/17 1935   02/24/17 1615  piperacillin-tazobactam (ZOSYN) IVPB 3.375 g     3.375 g 100 mL/hr over 30 Minutes Intravenous  Once 02/24/17 1613 02/24/17 1740   02/24/17 1615  vancomycin (VANCOCIN) IVPB 1000 mg/200 mL premix  Status:  Discontinued     1,000 mg 200 mL/hr over 60 Minutes Intravenous  Once 02/24/17 1613 02/24/17 1618         Objective:   Vitals:   03/01/17 1614 03/01/17 1840 03/01/17 2207 03/02/17 0450  BP: (!) 112/50 (!) 130/54 130/68 (!) 125/49  Pulse: 65 65 85 63  Resp: 17 18 16 16   Temp: 98.6 F (37 C) 98.1 F (36.7 C) 98.8 F (37.1 C) 98 F (36.7 C)  TempSrc: Oral Oral Oral Oral  SpO2: 98% 100% 100% 99%  Weight:   98.8 kg (217 lb 13 oz)   Height:        Wt Readings from Last 3 Encounters:  03/01/17 98.8 kg (217 lb 13 oz)  02/18/17 105.2 kg (232 lb)  12/10/16 95.5 kg (210 lb 8 oz)     Intake/Output Summary (Last 24 hours) at 03/02/17 1027 Last data filed at 03/02/17 0849  Gross per 24 hour  Intake             1370 ml  Output              550 ml  Net              820 ml     Physical Exam  Awake Alert, Oriented X 3, No new F.N deficits, Normal affect [place].AT,PERRAL Supple Neck,No JVD, No cervical  lymphadenopathy appriciated.  Symmetrical Chest wall movement, Good air movement bilaterally, CTAB RRR,No Gallops,Rubs or new Murmurs, No Parasternal Heave +ve B.Sounds, Abd Soft, No tenderness, No organomegaly appriciated, No rebound - guarding or rigidity. No Cyanosis, Clubbing or edema, No new Rash or bruise Right IJ dialysis catheter in place   Data Review:    CBC  Recent Labs Lab 02/24/17 1600  02/25/17 0310 02/25/17 2107 02/26/17 0511 02/27/17 0323 03/01/17 0525 03/01/17 2101  WBC 27.8*  --  30.3*  --  8.8 9.8 9.8  --   HGB 10.2*  < > 9.3* 8.2* 8.8* 8.0* 7.0* 8.8*  HCT 31.2*  < > 26.9* 24.0* 24.8* 23.0* 20.3* 25.3*  PLT 419*  --  342  --  281 265 249  --   MCV 86.2  --  82.3  --  79.5 80.4 81.2  --   MCH 28.2  --  28.4  --  28.2 28.0 28.0  --   MCHC  32.7  --  34.6  --  35.5 34.8 34.5  --   RDW 15.5  --  15.3  --  14.5 14.4 14.6  --   LYMPHSABS 4.7*  --   --   --   --   --   --   --   MONOABS 0.8  --   --   --   --   --   --   --   EOSABS 0.0  --   --   --   --   --   --   --   BASOSABS 0.0  --   --   --   --   --   --   --   < > = values in this interval not displayed.  Chemistries   Recent Labs Lab 02/24/17 1600  02/24/17 2318  02/26/17 1548 02/27/17 0323 02/27/17 1600 02/28/17 0345 02/28/17 0346 03/01/17 0525 03/02/17 0831  NA 140  < > 144  < > 137 137 137 139  --  135 137  K 6.0*  < > 4.4  < > 4.2 3.9 3.5 3.4*  --  3.9 4.0  CL 102  < > 110  < > 102 101 102 102  --  97* 100*  CO2 <7*  --  7*  < > 26 27 27 28   --  26 26  GLUCOSE 84  < > 208*  < > 206* 222* 225* 118*  --  273* 130*  BUN 86*  < > 73*  < > 12 12 19  21*  --  30* 36*  CREATININE 10.48*  < > 7.94*  < > 1.42* 1.26* 2.09* 2.87*  --  3.91* 4.62*  CALCIUM 8.2*  --  7.3*  < > 7.2* 7.4* 7.4* 7.7*  --  8.0* 8.5*  MG  --   < >  --   < > 2.2 2.2  --   --  2.2 2.0 1.7  AST 26  --  140*  --   --   --   --   --   --   --   --   ALT 14  --  69*  --   --   --   --   --   --   --   --   ALKPHOS 80  --  64   --   --   --   --   --   --   --   --   BILITOT 0.5  --  0.9  --   --   --   --   --   --   --   --   < > = values in this interval not displayed. ------------------------------------------------------------------------------------------------------------------ No results for input(s): CHOL, HDL, LDLCALC, TRIG, CHOLHDL, LDLDIRECT in the last 72 hours.  Lab Results  Component Value Date   HGBA1C 6.7 (H) 02/24/2017   ------------------------------------------------------------------------------------------------------------------ No results for input(s): TSH, T4TOTAL, T3FREE, THYROIDAB in the last 72 hours.  Invalid input(s): FREET3 ------------------------------------------------------------------------------------------------------------------ No results for input(s): VITAMINB12, FOLATE, FERRITIN, TIBC, IRON, RETICCTPCT in the last 72 hours.  Coagulation profile  Recent Labs Lab 02/24/17 1600  INR 1.36    No results for input(s): DDIMER in the last 72 hours.  Cardiac Enzymes  Recent Labs Lab 02/26/17 0511 02/26/17 1005 02/26/17 1548  TROPONINI 0.67* 0.57* 0.43*   ------------------------------------------------------------------------------------------------------------------ No results found for: BNP  Micro Results Recent Results (from the past 240  hour(s))  Blood Culture (routine x 2)     Status: Abnormal   Collection Time: 02/24/17  4:00 PM  Result Value Ref Range Status   Specimen Description BLOOD RIGHT ANTECUBITAL  Final   Special Requests   Final    BOTTLES DRAWN AEROBIC AND ANAEROBIC Blood Culture adequate volume   Culture  Setup Time   Final    GRAM POSITIVE COCCI ANAEROBIC BOTTLE ONLY CRITICAL RESULT CALLED TO, READ BACK BY AND VERIFIED WITH: Ferne Coe PHARMD 1839 02/25/17 A BROWNING    Culture (A)  Final    STAPHYLOCOCCUS SPECIES (COAGULASE NEGATIVE) THE SIGNIFICANCE OF ISOLATING THIS ORGANISM FROM A SINGLE SET OF BLOOD CULTURES WHEN MULTIPLE SETS ARE  DRAWN IS UNCERTAIN. PLEASE NOTIFY THE MICROBIOLOGY DEPARTMENT WITHIN ONE WEEK IF SPECIATION AND SENSITIVITIES ARE REQUIRED.    Report Status 02/27/2017 FINAL  Final  Blood Culture ID Panel (Reflexed)     Status: Abnormal   Collection Time: 02/24/17  4:00 PM  Result Value Ref Range Status   Enterococcus species NOT DETECTED NOT DETECTED Final   Listeria monocytogenes NOT DETECTED NOT DETECTED Final   Staphylococcus species DETECTED (A) NOT DETECTED Final    Comment: Methicillin (oxacillin) susceptible coagulase negative staphylococcus. Possible blood culture contaminant (unless isolated from more than one blood culture draw or clinical case suggests pathogenicity). No antibiotic treatment is indicated for blood  culture contaminants. CRITICAL RESULT CALLED TO, READ BACK BY AND VERIFIED WITH: Ferne Coe PHARMD 1839 02/25/17 A BROWNING    Staphylococcus aureus NOT DETECTED NOT DETECTED Final   Methicillin resistance NOT DETECTED NOT DETECTED Final   Streptococcus species NOT DETECTED NOT DETECTED Final   Streptococcus agalactiae NOT DETECTED NOT DETECTED Final   Streptococcus pneumoniae NOT DETECTED NOT DETECTED Final   Streptococcus pyogenes NOT DETECTED NOT DETECTED Final   Acinetobacter baumannii NOT DETECTED NOT DETECTED Final   Enterobacteriaceae species NOT DETECTED NOT DETECTED Final   Enterobacter cloacae complex NOT DETECTED NOT DETECTED Final   Escherichia coli NOT DETECTED NOT DETECTED Final   Klebsiella oxytoca NOT DETECTED NOT DETECTED Final   Klebsiella pneumoniae NOT DETECTED NOT DETECTED Final   Proteus species NOT DETECTED NOT DETECTED Final   Serratia marcescens NOT DETECTED NOT DETECTED Final   Haemophilus influenzae NOT DETECTED NOT DETECTED Final   Neisseria meningitidis NOT DETECTED NOT DETECTED Final   Pseudomonas aeruginosa NOT DETECTED NOT DETECTED Final   Candida albicans NOT DETECTED NOT DETECTED Final   Candida glabrata NOT DETECTED NOT DETECTED Final   Candida  krusei NOT DETECTED NOT DETECTED Final   Candida parapsilosis NOT DETECTED NOT DETECTED Final   Candida tropicalis NOT DETECTED NOT DETECTED Final  Blood Culture (routine x 2)     Status: None   Collection Time: 02/24/17  4:45 PM  Result Value Ref Range Status   Specimen Description BLOOD RIGHT HAND  Final   Special Requests   Final    BOTTLES DRAWN AEROBIC AND ANAEROBIC Blood Culture adequate volume   Culture NO GROWTH 5 DAYS  Final   Report Status 03/01/2017 FINAL  Final  Urine culture     Status: None   Collection Time: 02/24/17  6:36 PM  Result Value Ref Range Status   Specimen Description URINE, CATHETERIZED  Final   Special Requests NONE  Final   Culture NO GROWTH  Final   Report Status 02/26/2017 FINAL  Final  MRSA PCR Screening     Status: Abnormal   Collection Time: 02/24/17 10:00 PM  Result  Value Ref Range Status   MRSA by PCR (A) NEGATIVE Final    INVALID, UNABLE TO DETERMINE THE PRESENCE OF TARGET DNA DUE TO SPECIMEN INTEGRITY. RECOLLECTION REQUESTED.    Comment:        The GeneXpert MRSA Assay (FDA approved for NASAL specimens only), is one component of a comprehensive MRSA colonization surveillance program. It is not intended to diagnose MRSA infection nor to guide or monitor treatment for MRSA infections. CALLED TO V.SMITH,RN AT 0424 02/25/17 BY L.PITT   MRSA culture     Status: None   Collection Time: 02/24/17 10:00 PM  Result Value Ref Range Status   Specimen Description NOSE  Final   Special Requests NONE  Final   Culture NO MRSA DETECTED  Final   Report Status 02/27/2017 FINAL  Final  Culture, respiratory (NON-Expectorated)     Status: None   Collection Time: 02/25/17  8:12 PM  Result Value Ref Range Status   Specimen Description TRACHEAL ASPIRATE  Final   Special Requests NONE  Final   Gram Stain   Final    MODERATE WBC PRESENT,BOTH PMN AND MONONUCLEAR NO ORGANISMS SEEN    Culture RARE METHICILLIN RESISTANT STAPHYLOCOCCUS AUREUS  Final   Report  Status 02/28/2017 FINAL  Final   Organism ID, Bacteria METHICILLIN RESISTANT STAPHYLOCOCCUS AUREUS  Final      Susceptibility   Methicillin resistant staphylococcus aureus - MIC*    CIPROFLOXACIN >=8 RESISTANT Resistant     ERYTHROMYCIN <=0.25 SENSITIVE Sensitive     GENTAMICIN <=0.5 SENSITIVE Sensitive     OXACILLIN >=4 RESISTANT Resistant     TETRACYCLINE <=1 SENSITIVE Sensitive     VANCOMYCIN 1 SENSITIVE Sensitive     TRIMETH/SULFA <=10 SENSITIVE Sensitive     CLINDAMYCIN <=0.25 SENSITIVE Sensitive     RIFAMPIN <=0.5 SENSITIVE Sensitive     Inducible Clindamycin NEGATIVE Sensitive     * RARE METHICILLIN RESISTANT STAPHYLOCOCCUS AUREUS    Radiology Reports Ct Abdomen Pelvis Wo Contrast  Result Date: 02/24/2017 CLINICAL DATA:  Epigastric pain EXAM: CT ABDOMEN AND PELVIS WITHOUT CONTRAST TECHNIQUE: Multidetector CT imaging of the abdomen and pelvis was performed following the standard protocol without IV contrast. COMPARISON:  None. FINDINGS: Significant respiratory motion artifact compromises the study. Lower chest: Lung bases demonstrate no acute consolidation or pleural effusion. Borderline heart size. Minimal coronary artery calcification. Hepatobiliary: Calcified granuloma. No biliary dilatation. Status post cholecystectomy. Pancreas: Fuzzy appearance of the pancreas tail is suspected to be related to motion artifact. No ductal dilatation. Spleen: Normal in size without focal abnormality. Adrenals/Urinary Tract: Adrenal glands grossly within normal limits. No hydronephrosis. Bladder within normal limits Stomach/Bowel: Stomach nonenlarged. Small pill fragment. No dilated small bowel. No colon wall thickening. The appendix is slightly enlarged, measuring between 8 and 9 mm, no definitive surrounding inflammatory change. Vascular/Lymphatic: Aortic atherosclerosis. No enlarged abdominal or pelvic lymph nodes. Reproductive: Uterus and bilateral adnexa are unremarkable. Other: No free air or free  fluid. Musculoskeletal: Posterior stabilization rods and fixating screws at L2-L3 with laminectomy changes from L2 through L4. Interbody disc devices at L2-L3, L3-L4 and L4-L5. IMPRESSION: 1. The examination is compromised by respiratory motion artifact. 2. The appendix is slightly enlarged at 8 to 9 mm but no surrounding inflammatory changes. Electronically Signed   By: Donavan Foil M.D.   On: 02/24/2017 18:15   Dg Chest 2 View  Result Date: 02/24/2017 CLINICAL DATA:  Chest pain, slurred speech and fatigue. EXAM: CHEST  2 VIEW COMPARISON:  12/21/2013 and prior  radiographs FINDINGS: Cardiomegaly and pulmonary vascular congestion noted. Elevation of the right hemidiaphragm again identified. There is no evidence of focal airspace disease, pulmonary edema, suspicious pulmonary nodule/mass, pleural effusion, or pneumothorax. No acute bony abnormalities are identified. IMPRESSION: Cardiomegaly without evidence of acute cardiopulmonary disease. Electronically Signed   By: Margarette Canada M.D.   On: 02/24/2017 16:27   Dg Elbow Complete Right (3+view)  Result Date: 03/01/2017 CLINICAL DATA:  Right elbow pain. EXAM: RIGHT ELBOW - COMPLETE 3+ VIEW COMPARISON:  02/09/2008 FINDINGS: There is no evidence of fracture, dislocation, or joint effusion. There is no evidence of arthropathy or other focal bone abnormality. Soft tissues are unremarkable. IMPRESSION: Negative. Electronically Signed   By: Lorriane Shire M.D.   On: 03/01/2017 13:21   Ct Head Wo Contrast  Result Date: 02/24/2017 CLINICAL DATA:  Left-sided weakness. EXAM: CT HEAD WITHOUT CONTRAST TECHNIQUE: Contiguous axial images were obtained from the base of the skull through the vertex without intravenous contrast. COMPARISON:  None. FINDINGS: Brain: There is no evidence of acute infarct, intracranial hemorrhage, mass, midline shift, or extra-axial fluid collection. The ventricles and sulci are normal. Incidental dural calcification along the sphenoid wings.  Vascular: Calcified atherosclerosis at the skullbase. No hyperdense vessel. Skull: No fracture or focal osseous lesion. Sinuses/Orbits: Visualized paranasal sinuses and mastoid air cells are clear. Bilateral cataract extraction is noted. Other: None. IMPRESSION: Unremarkable CT appearance of the brain. Electronically Signed   By: Logan Bores M.D.   On: 02/24/2017 16:45   US Renal  Result Date: 02/25/2017 CLINICAL DATA:  Acute kidney injury. EXAM: RENAL / URINARY TRACT ULTRASOUND COMPLETE COMPARISON:  CT 02/24/2017 FINDINGS: Right Kidney: Length: 10.8 cm. Echogenicity within normal limits. No mass or hydronephrosis visualized. Left Kidney: Length: 10.9 cm. Echogenicity within normal limits. No mass or hydronephrosis visualized. Bladder: Decompressed by Foley catheter and not well evaluated. IMPRESSION: Unremarkable renal ultrasound.  No obstructive uropathy. Urinary bladder decompressed by Foley catheter and not evaluated. Electronically Signed   By: Jeb Levering M.D.   On: 02/25/2017 01:59   Dg Chest Port 1 View  Result Date: 03/01/2017 CLINICAL DATA:  Acute respiratory failure. EXAM: PORTABLE CHEST 1 VIEW COMPARISON:  02/27/2017 FINDINGS: Right jugular catheter terminates over the mid SVC. Endotracheal and enteric tubes have been removed. The cardiomediastinal silhouette is unchanged. Lung volumes are increased with improved aeration of the lung bases. Minimal right and mild left basilar opacity remain. No sizable pleural effusion or pneumothorax is identified. IMPRESSION: Interval extubation with improved aeration bilaterally. Mild residual left greater than right basilar atelectasis. Electronically Signed   By: Logan Bores M.D.   On: 03/01/2017 07:30   Dg Chest Port 1 View  Result Date: 02/27/2017 CLINICAL DATA:  Endotracheal tube in place. EXAM: PORTABLE CHEST 1 VIEW COMPARISON:  One-view chest x-ray 02/26/2017 FINDINGS: The heart size is exaggerated by low lung volumes. Endotracheal tube  remains low, 1.8 cm above the carina. The side port of the NG tube tip is above the GE junction. A right IJ line terminates in the SVC. Moderate pulmonary vascular congestion is present. Lung volumes have decreased slightly. Bibasilar airspace disease likely reflects atelectasis. IMPRESSION: 1. Endotracheal tube remains low, 1.8 cm above the carina. 2. Support apparatus is otherwise stable. The side port of the NG tube is above the GE junction. 3. Slight decrease in lung volumes with residual bibasilar atelectasis. Electronically Signed   By: San Morelle M.D.   On: 02/27/2017 07:40   Dg Chest [facility]  Result Date: 02/26/2017 CLINICAL DATA:  Respiratory failure. EXAM: PORTABLE CHEST 1 VIEW COMPARISON:  02/25/2017 FINDINGS: An endotracheal tube 2.1 cm above the carina, right IJ central venous catheter with tip overlying the mid SVC, and NG tube entering the stomach again identified. This is a mildly low volume film. Cardiomegaly, mild pulmonary vascular congestion and mild bibasilar atelectasis again noted. There is no evidence of pneumothorax or pleural effusion. IMPRESSION: Little significant change since the prior study with cardiomegaly, mild pulmonary vascular congestion and mild bibasilar atelectasis. Electronically Signed   By: Margarette Canada M.D.   On: 02/26/2017 07:05   Dg Chest Port 1 View  Result Date: 02/25/2017 CLINICAL DATA:  Respiratory failure. EXAM: PORTABLE CHEST 1 VIEW COMPARISON:  02/24/2017. FINDINGS: Patient rotated to the right. Endotracheal tube, NG tube, right IJ line in stable position. Cardiomegaly. Interim slight improvement of pulmonary vascular congestion and interstitial prominence. Small left pleural effusion. No pneumothorax. Postsurgical changes both shoulders. IMPRESSION: 1. Lines and tubes in stable position. 2. Cardiomegaly with interim slight improvement pulmonary vascular congestion and pulmonary interstitial edema. Small left pleural effusion. Electronically  Signed   By: Marcello Moores  Register   On: 02/25/2017 06:26   Dg Chest Port 1 View  Result Date: 02/24/2017 CLINICAL DATA:  ET tube, OG and line placement EXAM: PORTABLE CHEST 1 VIEW COMPARISON:  02/24/2017 FINDINGS: Insertion of right-sided central venous catheter, the tip projects over distal SVC. No right pneumothorax. Interval intubation, tip of the endotracheal tube is 1.8 cm superior to the carina. Esophageal tube tip is below the diaphragm. Possible ascending catheter with the tip positioned over the right atrial IVC junction. Stable cardiomegaly. Development of central vascular congestion and hazy asymmetric edema or infiltrate in the left chest. IMPRESSION: 1. Placement of support lines and tubes as above. Endotracheal tube tip approximately 1.8 cm superior to carina 2. Cardiomegaly. Development of central vascular congestion and asymmetric hazy opacity in the left thorax which may reflect asymmetric edema or infiltrate. Electronically Signed   By: Donavan Foil M.D.   On: 02/24/2017 23:38   Dg Abd Portable 1v  Result Date: 02/24/2017 CLINICAL DATA:  OG placement EXAM: PORTABLE ABDOMEN - 1 VIEW COMPARISON:  CT 02/24/2017 FINDINGS: Numerous support leads limit the exam. Esophageal tube tip overlies proximal stomach, side-port probably in the region of GE junction. Relatively gasless abdomen. Surgical hardware in the spine. IMPRESSION: Esophageal tube tip overlies proximal stomach, side-port probably at the GE junction, further advancement is suggested for more optimal positioning Electronically Signed   By: Donavan Foil M.D.   On: 02/24/2017 23:30    Time Spent in minutes  30   Lala Lund M.D on 03/02/2017 at 10:27 AM  Between 7am to 7pm - Pager - [phone] ( page via [email or web address], text pages only, please mention full 10 digit call back number). After 7pm go to [email or web address] - password [facility]

## 2017-03-02 NOTE — Plan of Care (Signed)
Problem: Education: Goal: Knowledge of [place] [facility] Education information/materials will improve Outcome: Progressing POC reviewed with pt.   

## 2017-03-02 NOTE — Consult Note (Signed)
[facility] Primary Care Navigator  03/02/2017  [name] 31-May-1946 [id number]   Met with patient and her daughter(Doris)to identify possible discharge needs. Patient and daughter reports that she was having abdominal pain, vomiting, diarrhea, slurred speech, and inability to walk that had led to this admission. Patient [name].   [facility] without difficulty.   Patient states that shemanages her own medications at home with use of "pill box" system weekly.  She reports that she uses [facility] for transportation to herdoctors'appointments.  Patient is independent at home using a cane or rolling walker and living with a friend [name] [name]) prior to admission. Daughter is her primary caregiver at home and friend assists with her care as well.   Discharge plan is home with home health services per patient.  Patient voiced understanding to call primary care provider's office when she returns back home, for a post discharge follow-up appointment within a week or sooner if needs arise.Patient letter (with PCP's contact number) was provided as theirreminder.  Discussed withpatient about [facility] CM services available for health management but patient and daughter denies having any other needs or concerns so far.  Both had expressed understanding to get a referral from primary care provider  to [facility] care management if deemed necessary in the future.  [facility] care management contact information provided for future needs that patientmay have.   For questions, please contact:  Dannielle Huh, BSN, RN- [facility] Primary Care Navigator  Telephone: [phone] [place]

## 2017-03-02 NOTE — Care Management Important Message (Signed)
Important Message  Patient Details  Name: [name] [name] MRN: [id number] Date of Birth: November 29, 1945   Medicare Important Message Given:  Yes    Kanishk Stroebel, Rory Percy, RN 03/02/2017, 4:10 PM

## 2017-03-02 NOTE — Progress Notes (Signed)
Physical Therapy Treatment Patient Details Name: [name] [name] MRN: [id number] DOB: [birth year] Today's Date: 03/02/2017    History of Present Illness Pt is a 71 yo F brought in by daughter from home on 02/24/17 due to c/o abdominal pain, vomitting, diarrhea, slurred speech, and inability to walk. Pt was admitted to critical care, but is now being seen by internal medicine. Pt currently suffers from acute renal failure, CHF, DM type II with diabetic neuropathy, obesity, and chronic pain syndrome in R elbow, lumbar spine, and neck. PMH: depression, dyslipidema, gallstones, GERD, HTN, PVD, s/p: lumbar fusion, rotator cuff surgery, cholecytectomy, L heart chath. PLOF: independent at home using a quad cane or rw living with a friend    PT Comments    Pt was on RA on arrival with SpO2 stats at 94. During ther ex pt began to desat to 88%. Reapplied 2L of O2 for transfers and ambulation. After O2 applied SpO2 remained >90% for remainder of session. Pt min guard for transfers and ambulation. Cueing for hand placement during transfers. Pt ambulated to restroom where PTA assisted pt with toileting. Cueing for walker management when performing hand hygiene. Patient would benefit from continued skilled PT. Will continue to follow acutely.     Follow Up Recommendations  Home health PT;Supervision/Assistance - 24 hour     Equipment Recommendations  None recommended by PT    Recommendations for Other Services       Precautions / Restrictions Precautions Precautions: Fall Precaution Comments: SOB, dizziness Restrictions Weight Bearing Restrictions: No    Mobility  Bed Mobility               General bed mobility comments: in chair on arrival  Transfers Overall transfer level: Needs assistance Equipment used: Rolling walker (2 wheeled) Transfers: Sit to/from Stand (to chair & toilet) Sit to Stand: Min guard         General transfer comment: Pt on 2L of O2 during activity. No report of  dizziness or lightheaded while transfering today.   Ambulation/Gait Ambulation/Gait assistance: Min guard Ambulation Distance (Feet): 12 Feet (12 ft x 2) Assistive device: Rolling walker (2 wheeled) Gait Pattern/deviations: Shuffle;Wide base of support;Trunk flexed   Gait velocity interpretation: Below normal speed for age/gender General Gait Details: Cueing required for postural control. Slow but overall steady gait.   Stairs            Wheelchair Mobility    Modified Rankin (Stroke Patients Only)       Balance Overall balance assessment: No apparent balance deficits (not formally assessed)                                          Cognition Arousal/Alertness: Awake/alert Behavior During Therapy: WFL for tasks assessed/performed Overall Cognitive Status: Within Functional Limits for tasks assessed                                 General Comments: pt is fully awake/alert      Exercises General Exercises - Upper Extremity Shoulder Flexion: AROM;Both;10 reps;Sidelying (Decreased ROM on R compared to L) General Exercises - Lower Extremity Ankle Circles/Pumps: AROM;Both;10 reps;Seated Gluteal Sets: AROM;Both;10 reps;Seated (cueing to breathe) Heel Slides: AROM;Both;10 reps;Seated (Pt assisted L side with UE) Hip ABduction/ADduction: AROM;Both;10 reps;Seated    General Comments  Pertinent Vitals/Pain Pain Assessment: No/denies pain    Home Living                      Prior Function            PT Goals (current goals can now be found in the care plan section) Acute Rehab PT Goals Patient Stated Goal: to go home PT Goal Formulation: With patient/family Time For Goal Achievement: 03/15/17 Potential to Achieve Goals: Fair Progress towards PT goals: Progressing toward goals    Frequency    Min 3X/week      PT Plan Current plan remains appropriate    Co-evaluation              AM-PAC PT "6  Clicks" Daily Activity  Outcome Measure  Difficulty turning over in bed (including adjusting bedclothes, sheets and blankets)?: A Little Difficulty moving from lying on back to sitting on the side of the bed? : A Little Difficulty sitting down on and standing up from a chair with arms (e.g., wheelchair, bedside commode, etc,.)?: A Little Help needed moving to and from a bed to chair (including a wheelchair)?: A Little Help needed walking in hospital room?: A Little Help needed climbing 3-5 steps with a railing? : A Little 6 Click Score: 18    End of Session Equipment Utilized During Treatment: Gait belt;Oxygen Activity Tolerance: Patient tolerated treatment well;No increased pain (pt did c/o dizziness) Patient left: in chair;with call bell/phone within reach   PT Visit Diagnosis: Muscle weakness (generalized) (M62.81)     Time: 7847-8412 PT Time Calculation (min) (ACUTE ONLY): 30 min  Charges:  $Gait Training: 8-22 mins $Therapeutic Exercise: 8-22 mins                    G Codes:       Benjiman Core, PTA Acute Rehab [phone]   Allena Katz 03/02/2017, 12:45 PM

## 2017-03-02 NOTE — Progress Notes (Signed)
Patients B/P is 115/44 and HR is 70. MD notified. MD stated to give atenolol. Orders followed. Will continue to monitor.

## 2017-03-03 DIAGNOSIS — I10 Essential (primary) hypertension: Secondary | ICD-10-CM

## 2017-03-03 LAB — GLUCOSE, CAPILLARY
Glucose-Capillary: 84 mg/dL (ref 65–99)
Glucose-Capillary: 279 mg/dL — ABNORMAL HIGH (ref 65–99)
Glucose-Capillary: 245 mg/dL — ABNORMAL HIGH (ref 65–99)
Glucose-Capillary: 80 mg/dL (ref 65–99)

## 2017-03-03 LAB — APTT: aPTT: 41 seconds — ABNORMAL HIGH (ref 24–36)

## 2017-03-03 LAB — RENAL FUNCTION PANEL
Albumin: 2.5 g/dL — ABNORMAL LOW (ref 3.5–5.0)
Anion gap: 13 (ref 5–15)
BUN: 40 mg/dL — ABNORMAL HIGH (ref 6–20)
CO2: 27 mmol/L (ref 22–32)
Calcium: 8.8 mg/dL — ABNORMAL LOW (ref 8.9–10.3)
Chloride: 101 mmol/L (ref 101–111)
Creatinine, Ser: 4.39 mg/dL — ABNORMAL HIGH (ref 0.44–1.00)
GFR calc Af Amer: 11 mL/min — ABNORMAL LOW (ref >60)
GFR calc non Af Amer: 9 mL/min — ABNORMAL LOW (ref >60)
Glucose, Bld: 78 mg/dL (ref 65–99)
Phosphorus: 4.7 mg/dL — ABNORMAL HIGH (ref 2.5–4.6)
Potassium: 3.8 mmol/L (ref 3.5–5.1)
Sodium: 141 mmol/L (ref 135–145)

## 2017-03-03 LAB — CBC
HCT: 27 % — ABNORMAL LOW (ref 36.0–46.0)
Hemoglobin: 9.1 g/dL — ABNORMAL LOW (ref 12.0–15.0)
MCH: 27.3 pg (ref 26.0–34.0)
MCHC: 33.7 g/dL (ref 30.0–36.0)
MCV: 81.1 fL (ref 78.0–100.0)
Platelets: 288 10*3/uL (ref 150–400)
RBC: 3.33 MIL/uL — ABNORMAL LOW (ref 3.87–5.11)
RDW: 15 % (ref 11.5–15.5)
WBC: 8.1 10*3/uL (ref 4.0–10.5)

## 2017-03-03 LAB — MAGNESIUM: Magnesium: 1.5 mg/dL — ABNORMAL LOW (ref 1.7–2.4)

## 2017-03-03 MED ORDER — INSULIN ASPART 100 UNIT/ML ~~LOC~~ SOLN
0.0000 [IU] | Freq: Three times a day (TID) | SUBCUTANEOUS | Status: DC
  Administered 2017-03-03 – 2017-03-04 (×2): 3 [IU] via SUBCUTANEOUS
  Administered 2017-03-04: 2 [IU] via SUBCUTANEOUS

## 2017-03-03 MED ORDER — INSULIN ASPART 100 UNIT/ML ~~LOC~~ SOLN
3.0000 [IU] | Freq: Three times a day (TID) | SUBCUTANEOUS | Status: DC
  Administered 2017-03-03 – 2017-03-04 (×4): 3 [IU] via SUBCUTANEOUS

## 2017-03-03 NOTE — Progress Notes (Signed)
Inpatient Diabetes Program Recommendations  AACE/ADA: New Consensus Statement on Inpatient Glycemic Control (2015)  Target Ranges:  Prepandial:   less than 140 mg/dL      Peak postprandial:   less than 180 mg/dL (1-2 hours)      Critically ill patients:  140 - 180 mg/dL   Results for MARGHERITA, COLLYER (MRN [id number]) as of 03/03/2017 11:23  Ref. Range 03/02/2017 07:47 03/02/2017 12:26 03/02/2017 16:41 03/02/2017 22:37 03/03/2017 07:40  Glucose-Capillary Latest Ref Range: 65 - 99 mg/dL 111 (H) 225 (H) 189 (H) 240 (H) 84   Review of Glycemic Control  Diabetes history: DM2 Outpatient Diabetes medications: Lantus 12 units QHS, Metformin 1000 mg BID, Victoza 1.2 mg daily Current orders for Inpatient glycemic control: Lantus 15 units daily, Novolog 0-15 units ACHS  Inpatient Diabetes Program Recommendations:  Insulin - Meal Coverage: Post prandial glucose is consistently elevated. Please consider ordering Novolog 3 units TID with meals for meal coverage if patient eats at least 50% of meals.  Thanks, Barnie Alderman, RN, MSN, CDE Diabetes Coordinator Inpatient Diabetes Program [phone] (Team Pager from 8am to 5pm)

## 2017-03-03 NOTE — Progress Notes (Signed)
[facility] R[facility]ING NOTE   Subjective:   Interval History:     Interval History: Pt is a 71 y.o.yo femalewith DM and HTN but creatinine of 1.1 on 3/1/18who was admitted on 5/16/2018with seems like sepsis syndrome with AKI- severe acidosis and hyperkalemia- home meds included ACE, NSAID and potassium  She required brief support with CRRT and is now making urine No dialysis since 5/19   Objective:  Vital signs in last 24 hours:  Temp:  [98 F (36.7 C)-98.3 F (36.8 C)] 98 F (36.7 C) (05/23 0533) Pulse Rate:  [62-98] 98 (05/23 0533) Resp:  [18-20] 20 (05/23 0533) BP: (109-125)/(44-58) 109/50 (05/23 0533) SpO2:  [98 %-100 %] 98 % (05/23 0533) Weight:  [219 lb 9.3 oz (99.6 kg)] 219 lb 9.3 oz (99.6 kg) (05/22 2239)  Weight change: 1 lb 12.2 oz (0.8 kg) Filed Weights   03/01/17 0500 03/01/17 2207 03/02/17 2239  Weight: 220 lb 3.8 oz (99.9 kg) 217 lb 13 oz (98.8 kg) 219 lb 9.3 oz (99.6 kg)    Intake/Output: I/O last 3 completed shifts: In: 634 [P.O.:624; I.V.:10] Out: 950 [Urine:950]   Intake/Output this shift:  No intake/output data recorded.  CVS- RRR RS- CTA ABD- BS present soft non-distended EXT- no edema   Basic Metabolic Panel:  Recent Labs Lab 02/27/17 0323 02/27/17 1600 02/28/17 0345 02/28/17 0346 03/01/17 0525 03/02/17 0831 03/03/17 0655  NA 137 137 139  --  135 137 141  K 3.9 3.5 3.4*  --  3.9 4.0 3.8  CL 101 102 102  --  97* 100* 101  CO2 27 27 28   --  26 26 27   GLUCOSE 222* 225* 118*  --  273* 130* 78  BUN 12 19 21*  --  30* 36* 40*  CREATININE 1.26* 2.09* 2.87*  --  3.91* 4.62* 4.39*  CALCIUM 7.4* 7.4* 7.7*  --  8.0* 8.5* 8.8*  MG 2.2  --   --  2.2 2.0 1.7 1.5*  PHOS 1.4* 4.9* 5.7*  --  4.7* 5.4* 4.7*    Liver Function Tests:  Recent Labs Lab 02/24/17 1600 02/24/17 2318  02/27/17 1600 02/28/17 0345 03/01/17 0525 03/02/17 0831 03/03/17 0655  AST 26 140*  --   --   --   --   --   --   ALT 14 69*  --   --   --    --   --   --   ALKPHOS 80 64  --   --   --   --   --   --   BILITOT 0.5 0.9  --   --   --   --   --   --   PROT 7.5 5.4*  --   --   --   --   --   --   ALBUMIN 3.0* 2.0*  < > 2.1* 2.0* 2.2* 2.5* 2.5*  < > = values in this interval not displayed.  Recent Labs Lab 02/24/17 1600  LIPASE 205*   No results for input(s): AMMONIA in the last 168 hours.  CBC:  Recent Labs Lab 02/24/17 1600  02/25/17 0310  02/26/17 0511 02/27/17 0323 03/01/17 0525 03/01/17 2101 03/03/17 0655  WBC 27.8*  --  30.3*  --  8.8 9.8 9.8  --  8.1  NEUTROABS 22.3*  --   --   --   --   --   --   --   --   HGB 10.2*  < >  9.3*  < > 8.8* 8.0* 7.0* 8.8* 9.1*  HCT 31.2*  < > 26.9*  < > 24.8* 23.0* 20.3* 25.3* 27.0*  MCV 86.2  --  82.3  --  79.5 80.4 81.2  --  81.1  PLT 419*  --  342  --  281 265 249  --  288  < > = values in this interval not displayed.  Cardiac Enzymes:  Recent Labs Lab 02/24/17 1916  02/25/17 1748 02/25/17 2309 02/26/17 0511 02/26/17 1005 02/26/17 1548  CKTOTAL 62  --   --   --   --   --   --   TROPONINI  --   < > 0.86* 0.81* 0.67* 0.57* 0.43*  < > = values in this interval not displayed.  BNP: Invalid input(s): POCBNP  CBG:  Recent Labs Lab 03/02/17 0747 03/02/17 1226 03/02/17 1641 03/02/17 2237 03/03/17 0740  GLUCAP 111* 225* 189* 240* 84    Microbiology: Results for orders placed or performed during the hospital encounter of 02/24/17  Blood Culture (routine x 2)     Status: Abnormal   Collection Time: 02/24/17  4:00 PM  Result Value Ref Range Status   Specimen Description BLOOD RIGHT ANTECUBITAL  Final   Special Requests   Final    BOTTLES DRAWN AEROBIC AND ANAEROBIC Blood Culture adequate volume   Culture  Setup Time   Final    GRAM POSITIVE COCCI ANAEROBIC BOTTLE ONLY CRITICAL RESULT CALLED TO, READ BACK BY AND VERIFIED WITH: Ferne Coe PHARMD 1839 02/25/17 A BROWNING    Culture (A)  Final    STAPHYLOCOCCUS SPECIES (COAGULASE NEGATIVE) THE SIGNIFICANCE OF  ISOLATING THIS ORGANISM FROM A SINGLE SET OF BLOOD CULTURES WHEN MULTIPLE SETS ARE DRAWN IS UNCERTAIN. PLEASE NOTIFY THE MICROBIOLOGY DEPARTMENT WITHIN ONE WEEK IF SPECIATION AND SENSITIVITIES ARE REQUIRED.    Report Status 02/27/2017 FINAL  Final  Blood Culture ID Panel (Reflexed)     Status: Abnormal   Collection Time: 02/24/17  4:00 PM  Result Value Ref Range Status   Enterococcus species NOT DETECTED NOT DETECTED Final   Listeria monocytogenes NOT DETECTED NOT DETECTED Final   Staphylococcus species DETECTED (A) NOT DETECTED Final    Comment: Methicillin (oxacillin) susceptible coagulase negative staphylococcus. Possible blood culture contaminant (unless isolated from more than one blood culture draw or clinical case suggests pathogenicity). No antibiotic treatment is indicated for blood  culture contaminants. CRITICAL RESULT CALLED TO, READ BACK BY AND VERIFIED WITH: Ferne Coe PHARMD [id number] 02/25/17 A BROWNING    Staphylococcus aureus NOT DETECTED NOT DETECTED Final   Methicillin resistance NOT DETECTED NOT DETECTED Final   Streptococcus species NOT DETECTED NOT DETECTED Final   Streptococcus agalactiae NOT DETECTED NOT DETECTED Final   Streptococcus pneumoniae NOT DETECTED NOT DETECTED Final   Streptococcus pyogenes NOT DETECTED NOT DETECTED Final   Acinetobacter baumannii NOT DETECTED NOT DETECTED Final   Enterobacteriaceae species NOT DETECTED NOT DETECTED Final   Enterobacter cloacae complex NOT DETECTED NOT DETECTED Final   Escherichia coli NOT DETECTED NOT DETECTED Final   Klebsiella oxytoca NOT DETECTED NOT DETECTED Final   Klebsiella pneumoniae NOT DETECTED NOT DETECTED Final   Proteus species NOT DETECTED NOT DETECTED Final   Serratia marcescens NOT DETECTED NOT DETECTED Final   Haemophilus influenzae NOT DETECTED NOT DETECTED Final   Neisseria meningitidis NOT DETECTED NOT DETECTED Final   Pseudomonas aeruginosa NOT DETECTED NOT DETECTED Final   Candida albicans NOT DETECTED  NOT DETECTED Final   Candida glabrata  NOT DETECTED NOT DETECTED Final   Candida krusei NOT DETECTED NOT DETECTED Final   Candida parapsilosis NOT DETECTED NOT DETECTED Final   Candida tropicalis NOT DETECTED NOT DETECTED Final  Blood Culture (routine x 2)     Status: None   Collection Time: 02/24/17  4:45 PM  Result Value Ref Range Status   Specimen Description BLOOD RIGHT HAND  Final   Special Requests   Final    BOTTLES DRAWN AEROBIC AND ANAEROBIC Blood Culture adequate volume   Culture NO GROWTH 5 DAYS  Final   Report Status 03/01/2017 FINAL  Final  Urine culture     Status: None   Collection Time: 02/24/17  6:36 PM  Result Value Ref Range Status   Specimen Description URINE, CATHETERIZED  Final   Special Requests NONE  Final   Culture NO GROWTH  Final   Report Status 02/26/2017 FINAL  Final  MRSA PCR Screening     Status: Abnormal   Collection Time: 02/24/17 10:00 PM  Result Value Ref Range Status   MRSA by PCR (A) NEGATIVE Final    INVALID, UNABLE TO DETERMINE THE PRESENCE OF TARGET DNA DUE TO SPECIMEN INTEGRITY. RECOLLECTION REQUESTED.    Comment:        The GeneXpert MRSA Assay (FDA approved for NASAL specimens only), is one component of a comprehensive MRSA colonization surveillance program. It is not intended to diagnose MRSA infection nor to guide or monitor treatment for MRSA infections. CALLED TO V.SMITH,RN AT 0424 02/25/17 BY L.PITT   MRSA culture     Status: None   Collection Time: 02/24/17 10:00 PM  Result Value Ref Range Status   Specimen Description NOSE  Final   Special Requests NONE  Final   Culture NO MRSA DETECTED  Final   Report Status 02/27/2017 FINAL  Final  Culture, respiratory (NON-Expectorated)     Status: None   Collection Time: 02/25/17  8:12 PM  Result Value Ref Range Status   Specimen Description TRACHEAL ASPIRATE  Final   Special Requests NONE  Final   Gram Stain   Final    MODERATE WBC PRESENT,BOTH PMN AND MONONUCLEAR NO ORGANISMS  SEEN    Culture RARE METHICILLIN RESISTANT STAPHYLOCOCCUS AUREUS  Final   Report Status 02/28/2017 FINAL  Final   Organism ID, Bacteria METHICILLIN RESISTANT STAPHYLOCOCCUS AUREUS  Final      Susceptibility   Methicillin resistant staphylococcus aureus - MIC*    CIPROFLOXACIN >=8 RESISTANT Resistant     ERYTHROMYCIN <=0.25 SENSITIVE Sensitive     GENTAMICIN <=0.5 SENSITIVE Sensitive     OXACILLIN >=4 RESISTANT Resistant     TETRACYCLINE <=1 SENSITIVE Sensitive     VANCOMYCIN 1 SENSITIVE Sensitive     TRIMETH/SULFA <=10 SENSITIVE Sensitive     CLINDAMYCIN <=0.25 SENSITIVE Sensitive     RIFAMPIN <=0.5 SENSITIVE Sensitive     Inducible Clindamycin NEGATIVE Sensitive     * RARE METHICILLIN RESISTANT STAPHYLOCOCCUS AUREUS    Coagulation Studies: No results for input(s): LABPROT, INR in the last 72 hours.  Urinalysis: No results for input(s): COLORURINE, LABSPEC, PHURINE, GLUCOSEU, HGBUR, BILIRUBINUR, KETONESUR, PROTEINUR, UROBILINOGEN, NITRITE, LEUKOCYTESUR in the last 72 hours.  Invalid input(s): APPERANCEUR    Imaging: Dg Elbow Complete Right (3+view)  Result Date: 03/01/2017 CLINICAL DATA:  Right elbow pain. EXAM: RIGHT ELBOW - COMPLETE 3+ VIEW COMPARISON:  02/09/2008 FINDINGS: There is no evidence of fracture, dislocation, or joint effusion. There is no evidence of arthropathy or other focal bone abnormality. Soft  tissues are unremarkable. IMPRESSION: Negative. Electronically Signed   By: Lorriane Shire M.D.   On: 03/01/2017 13:21     Medications:   . sodium chloride 250 mL (02/28/17 0400)   . aspirin  81 mg Per Tube Daily  . atenolol  25 mg Oral Daily  . atorvastatin  10 mg Oral q1800  . doxycycline  100 mg Oral Q12H  . heparin  5,000 Units Subcutaneous Q8H  . insulin aspart  0-15 Units Subcutaneous TID AC & HS  . insulin glargine  15 Units Subcutaneous Daily  . isosorbide mononitrate  30 mg Oral Daily  . mouth rinse  15 mL Mouth Rinse BID  . pantoprazole  40 mg Oral  Daily   sodium chloride, HYDROcodone-acetaminophen, ondansetron (ZOFRAN) IV, sodium chloride flush  Assessment/ Plan:  1. Renal-AKI in the setting of extreme hypotension on ACE/NSAID- U/A pretty bland- CT no obstruction. Suspect ATN. RequiredCRRT due to hemodynamic instability/acidosis and hyperkalemia- using heparin with - started overnight 5/16---- stopped 5/19 AM. Will continue to observe Creatinine pending with good urine output - no dialysis needs -- continues to slowly improve  2. HTN/vol- euvolemic and controlled 3. Anemia-hgb 9.3--8.8--8.0 -- 9.1  appears stablewatch for now- transfuse as needed 4. Metabolic acidosis-resolved  5. Hyperkalemia-corrected  6. Sepsis? - on zosyn/vanc- CT negative for source- per CCM- now on  doxycyline 7. Hypophos- resolved    LOS: 7 [place] Aigner W @TODAY @8 :25 AM

## 2017-03-03 NOTE — Progress Notes (Addendum)
PROGRESS NOTE    DAIL MEECE  XAJ:[id number] DOB: March 16, 1946 DOA: 02/24/2017 PCP: Bartholomew Crews, MD   Brief Narrative: 71 yr female with long term DM, HTN, CAD with Cath 0947, diastolic CHF but EF >09%, DJD, Cervical stenosis, lumbar disc dz, DM neuropathy, dyslipidemia, obesity &depression who was admitted on 02/24/2017 by pulmonary critical care due to generalized weakness, metabolic encephalopathy with hypovolemic shock, acute renal failure, she required endotracheal intubation due to encephalopathy and failure to protect airway. She was extubated on 02/28/2017 and transferred to [facility]. She was required CRRT in ICU for renal failure.  Assessment & Plan:  #  Acute kidney injury required CRRT via right IJ temporary catheter in ICU. Acute kidney injury thought to be due to dehydration and concurrent use of Lasix and ACE inhibitor and NSAIDs use. The last dialysis on 5/19.  -Today's serum creatinine level elevated to 4.3 and decreased urine output. Continue to monitor closely. Nephrology consult appreciated. Watch for renal recovery.  #1 out of 2 blood culture positive for coag-negative staph likely contaminated. Patient also likely with mild bronchitis as there are staph aureus in the sputum. No sign of pneumonia. Currently on oral doxycycline. Continue to monitor. Patient has no shortness of breath or cough.  #CAD with mild elevation in troponin on admission likely demand ischemia. Echo with no wall motion abnormalities. Patient has no chest pain or shortness of breath. Continue aspirin, atenolol, Lipitor and Imdur.  #Diabetes on insulin: Added aspart insulin before meals. Continue Lantus and sliding scale. Monitor blood sugar level.  #Chronic neck pain, right elbow pain with history of degenerative joint disease: Continue supportive care and physical therapy. Patient also likely with chronic physical deconditioning.  #Acute metabolic encephalopathy requiring mechanical ventilation  for airway protection. Now resolved.  #Dyslipidemia: Continue statin  #Hypertension: Continue Imdur and beta blocker. Monitor blood pressure.  #Chronic diastolic congestive heart failure: Continue current cardiac medications. She has no chest pain or shortness of breath.  # Anemia due to chronic kidney disease: Hemoglobin is stable.  Active Problems:   Acute renal failure (ARF) (HCC)   Acute respiratory failure with hypoxemia (HCC)   Nasogastric tube present   Endotracheal tube present   History of ETT   AKI (acute kidney injury) ([place])  DVT prophylaxis: Heparin subcutaneous Code Status: Full code Family Communication: No family at bedside Disposition Plan: Likely discharge home on rehabilitation in 1-2 days    Consultants:   Nephrologist  Admitted by the pulmonary critical care  Procedures: CT head 5/16 > negative. CT A / P 5/16 > appendix slightly enlarged but no surrounding inflammatory changes. CXR 5/21 > mild bilateral bases Echo 5/17 > Vigorous LV systolic function; mild diastolic dysfunction;sclerotic aortic valve with mild AI; mild MR; mild TR with mildlyelevated pulmonary pressure. Renal US 5/16 > Unremarkable Needed endotracheal intubation on the day of admission extubated 02/28/2017  Antimicrobials: Treated with vancomycin and Zosyn and ceftriaxone. Currently on doxycycline since 5/21 Subjective: Seen and examined at bedside. Sitting on chair comfortably. Denied headache, dizziness, nausea, vomiting, chest pain, shortness of breath. Denies urinary complaints.  Objective: Vitals:   03/02/17 1749 03/02/17 2239 03/03/17 0533 03/03/17 0925  BP: (!) 125/58 (!) 125/57 (!) 109/50 108/77  Pulse: 62 76 98 77  Resp: 18 18 20 18   Temp: 98.1 F (36.7 C) 98.3 F (36.8 C) 98 F (36.7 C) 98.6 F (37 C)  TempSrc: Oral Oral Oral Oral  SpO2: 98% 100% 98% 98%  Weight:  99.6 kg (219  lb 9.3 oz)    Height:        Intake/Output Summary (Last 24 hours) at 03/03/17  1435 Last data filed at 03/03/17 0900  Gross per 24 hour  Intake              504 ml  Output              650 ml  Net             -146 ml   Filed Weights   03/01/17 0500 03/01/17 2207 03/02/17 2239  Weight: 99.9 kg (220 lb 3.8 oz) 98.8 kg (217 lb 13 oz) 99.6 kg (219 lb 9.3 oz)    Examination:  General exam: Appears calm and comfortable. Neck:Right IJ dialysis catheter site clean with no bleeding or discharge. Respiratory system: Clear to auscultation. Respiratory effort normal. No wheezing or crackle Cardiovascular system: S1 & S2 heard, RRR.  No pedal edema. Gastrointestinal system: Abdomen is nondistended, soft and nontender. Normal bowel sounds heard. Central nervous system: Alert and oriented. No focal neurological deficits. Extremities: Symmetric 5 x 5 power. Skin: No rashes, lesions or ulcers Psychiatry: Judgement and insight appear normal. Mood & affect appropriate.     Data Reviewed: I have personally reviewed following labs and imaging studies  CBC:  Recent Labs Lab 02/24/17 1600  02/25/17 0310  02/26/17 0511 02/27/17 0323 03/01/17 0525 03/01/17 2101 03/03/17 0655  WBC 27.8*  --  30.3*  --  8.8 9.8 9.8  --  8.1  NEUTROABS 22.3*  --   --   --   --   --   --   --   --   HGB 10.2*  < > 9.3*  < > 8.8* 8.0* 7.0* 8.8* 9.1*  HCT 31.2*  < > 26.9*  < > 24.8* 23.0* 20.3* 25.3* 27.0*  MCV 86.2  --  82.3  --  79.5 80.4 81.2  --  81.1  PLT 419*  --  342  --  281 265 249  --  288  < > = values in this interval not displayed. Basic Metabolic Panel:  Recent Labs Lab 02/27/17 0323 02/27/17 1600 02/28/17 0345 02/28/17 0346 03/01/17 0525 03/02/17 0831 03/03/17 0655  NA 137 137 139  --  135 137 141  K 3.9 3.5 3.4*  --  3.9 4.0 3.8  CL 101 102 102  --  97* 100* 101  CO2 27 27 28   --  26 26 27   GLUCOSE 222* 225* 118*  --  273* 130* 78  BUN 12 19 21*  --  30* 36* 40*  CREATININE 1.26* 2.09* 2.87*  --  3.91* 4.62* 4.39*  CALCIUM 7.4* 7.4* 7.7*  --  8.0* 8.5* 8.8*  MG  2.2  --   --  2.2 2.0 1.7 1.5*  PHOS 1.4* 4.9* 5.7*  --  4.7* 5.4* 4.7*   GFR: Estimated Creatinine Clearance: 13.2 mL/min (A) (by C-G formula based on SCr of 4.39 mg/dL (H)). Liver Function Tests:  Recent Labs Lab 02/24/17 1600 02/24/17 2318  02/27/17 1600 02/28/17 0345 03/01/17 0525 03/02/17 0831 03/03/17 0655  AST 26 140*  --   --   --   --   --   --   ALT 14 69*  --   --   --   --   --   --   ALKPHOS 80 64  --   --   --   --   --   --  BILITOT 0.5 0.9  --   --   --   --   --   --   PROT 7.5 5.4*  --   --   --   --   --   --   ALBUMIN 3.0* 2.0*  < > 2.1* 2.0* 2.2* 2.5* 2.5*  < > = values in this interval not displayed.  Recent Labs Lab 02/24/17 1600  LIPASE 205*   No results for input(s): AMMONIA in the last 168 hours. Coagulation Profile:  Recent Labs Lab 02/24/17 1600  INR 1.36   Cardiac Enzymes:  Recent Labs Lab 02/24/17 1916  02/25/17 1748 02/25/17 2309 02/26/17 0511 02/26/17 1005 02/26/17 1548  CKTOTAL 62  --   --   --   --   --   --   TROPONINI  --   < > 0.86* 0.81* 0.67* 0.57* 0.43*  < > = values in this interval not displayed. BNP (last 3 results) No results for input(s): PROBNP in the last 8760 hours. HbA1C: No results for input(s): HGBA1C in the last 72 hours. CBG:  Recent Labs Lab 03/02/17 1226 03/02/17 1641 03/02/17 2237 03/03/17 0740 03/03/17 1150  GLUCAP 225* 189* 240* 84 279*   Lipid Profile: No results for input(s): CHOL, HDL, LDLCALC, TRIG, CHOLHDL, LDLDIRECT in the last 72 hours. Thyroid Function Tests: No results for input(s): TSH, T4TOTAL, FREET4, T3FREE, THYROIDAB in the last 72 hours. Anemia Panel: No results for input(s): VITAMINB12, FOLATE, FERRITIN, TIBC, IRON, RETICCTPCT in the last 72 hours. Sepsis Labs:  Recent Labs Lab 02/24/17 2318 02/25/17 0050 02/25/17 0310 02/25/17 0744 02/26/17 0511 02/26/17 0810  PROCALCITON 1.41  --  1.41  --  1.99  --   LATICACIDVEN 10.1* 9.0*  --  8.1*  --  2.1*    Recent  Results (from the past 240 hour(s))  Blood Culture (routine x 2)     Status: Abnormal   Collection Time: 02/24/17  4:00 PM  Result Value Ref Range Status   Specimen Description BLOOD RIGHT ANTECUBITAL  Final   Special Requests   Final    BOTTLES DRAWN AEROBIC AND ANAEROBIC Blood Culture adequate volume   Culture  Setup Time   Final    GRAM POSITIVE COCCI ANAEROBIC BOTTLE ONLY CRITICAL RESULT CALLED TO, READ BACK BY AND VERIFIED WITH: Ferne Coe PHARMD 1839 02/25/17 A BROWNING    Culture (A)  Final    STAPHYLOCOCCUS SPECIES (COAGULASE NEGATIVE) THE SIGNIFICANCE OF ISOLATING THIS ORGANISM FROM A SINGLE SET OF BLOOD CULTURES WHEN MULTIPLE SETS ARE DRAWN IS UNCERTAIN. PLEASE NOTIFY THE MICROBIOLOGY DEPARTMENT WITHIN ONE WEEK IF SPECIATION AND SENSITIVITIES ARE REQUIRED.    Report Status 02/27/2017 FINAL  Final  Blood Culture ID Panel (Reflexed)     Status: Abnormal   Collection Time: 02/24/17  4:00 PM  Result Value Ref Range Status   Enterococcus species NOT DETECTED NOT DETECTED Final   Listeria monocytogenes NOT DETECTED NOT DETECTED Final   Staphylococcus species DETECTED (A) NOT DETECTED Final    Comment: Methicillin (oxacillin) susceptible coagulase negative staphylococcus. Possible blood culture contaminant (unless isolated from more than one blood culture draw or clinical case suggests pathogenicity). No antibiotic treatment is indicated for blood  culture contaminants. CRITICAL RESULT CALLED TO, READ BACK BY AND VERIFIED WITH: Ferne Coe PHARMD 1839 02/25/17 A BROWNING    Staphylococcus aureus NOT DETECTED NOT DETECTED Final   Methicillin resistance NOT DETECTED NOT DETECTED Final   Streptococcus species NOT DETECTED NOT DETECTED Final  Streptococcus agalactiae NOT DETECTED NOT DETECTED Final   Streptococcus pneumoniae NOT DETECTED NOT DETECTED Final   Streptococcus pyogenes NOT DETECTED NOT DETECTED Final   Acinetobacter baumannii NOT DETECTED NOT DETECTED Final    Enterobacteriaceae species NOT DETECTED NOT DETECTED Final   Enterobacter cloacae complex NOT DETECTED NOT DETECTED Final   Escherichia coli NOT DETECTED NOT DETECTED Final   Klebsiella oxytoca NOT DETECTED NOT DETECTED Final   Klebsiella pneumoniae NOT DETECTED NOT DETECTED Final   Proteus species NOT DETECTED NOT DETECTED Final   Serratia marcescens NOT DETECTED NOT DETECTED Final   Haemophilus influenzae NOT DETECTED NOT DETECTED Final   Neisseria meningitidis NOT DETECTED NOT DETECTED Final   Pseudomonas aeruginosa NOT DETECTED NOT DETECTED Final   Candida albicans NOT DETECTED NOT DETECTED Final   Candida glabrata NOT DETECTED NOT DETECTED Final   Candida krusei NOT DETECTED NOT DETECTED Final   Candida parapsilosis NOT DETECTED NOT DETECTED Final   Candida tropicalis NOT DETECTED NOT DETECTED Final  Blood Culture (routine x 2)     Status: None   Collection Time: 02/24/17  4:45 PM  Result Value Ref Range Status   Specimen Description BLOOD RIGHT HAND  Final   Special Requests   Final    BOTTLES DRAWN AEROBIC AND ANAEROBIC Blood Culture adequate volume   Culture NO GROWTH 5 DAYS  Final   Report Status 03/01/2017 FINAL  Final  Urine culture     Status: None   Collection Time: 02/24/17  6:36 PM  Result Value Ref Range Status   Specimen Description URINE, CATHETERIZED  Final   Special Requests NONE  Final   Culture NO GROWTH  Final   Report Status 02/26/2017 FINAL  Final  MRSA PCR Screening     Status: Abnormal   Collection Time: 02/24/17 10:00 PM  Result Value Ref Range Status   MRSA by PCR (A) NEGATIVE Final    INVALID, UNABLE TO DETERMINE THE PRESENCE OF TARGET DNA DUE TO SPECIMEN INTEGRITY. RECOLLECTION REQUESTED.    Comment:        The GeneXpert MRSA Assay (FDA approved for NASAL specimens only), is one component of a comprehensive MRSA colonization surveillance program. It is not intended to diagnose MRSA infection nor to guide or monitor treatment for MRSA  infections. CALLED TO V.SMITH,RN AT 0424 02/25/17 BY L.PITT   MRSA culture     Status: None   Collection Time: 02/24/17 10:00 PM  Result Value Ref Range Status   Specimen Description NOSE  Final   Special Requests NONE  Final   Culture NO MRSA DETECTED  Final   Report Status 02/27/2017 FINAL  Final  Culture, respiratory (NON-Expectorated)     Status: None   Collection Time: 02/25/17  8:12 PM  Result Value Ref Range Status   Specimen Description TRACHEAL ASPIRATE  Final   Special Requests NONE  Final   Gram Stain   Final    MODERATE WBC PRESENT,BOTH PMN AND MONONUCLEAR NO ORGANISMS SEEN    Culture RARE METHICILLIN RESISTANT STAPHYLOCOCCUS AUREUS  Final   Report Status 02/28/2017 FINAL  Final   Organism ID, Bacteria METHICILLIN RESISTANT STAPHYLOCOCCUS AUREUS  Final      Susceptibility   Methicillin resistant staphylococcus aureus - MIC*    CIPROFLOXACIN >=8 RESISTANT Resistant     ERYTHROMYCIN <=0.25 SENSITIVE Sensitive     GENTAMICIN <=0.5 SENSITIVE Sensitive     OXACILLIN >=4 RESISTANT Resistant     TETRACYCLINE <=1 SENSITIVE Sensitive     VANCOMYCIN  1 SENSITIVE Sensitive     TRIMETH/SULFA <=10 SENSITIVE Sensitive     CLINDAMYCIN <=0.25 SENSITIVE Sensitive     RIFAMPIN <=0.5 SENSITIVE Sensitive     Inducible Clindamycin NEGATIVE Sensitive     * RARE METHICILLIN RESISTANT STAPHYLOCOCCUS AUREUS         Radiology Studies: No results found.      Scheduled Meds: . aspirin  81 mg Per Tube Daily  . atenolol  25 mg Oral Daily  . atorvastatin  10 mg Oral q1800  . doxycycline  100 mg Oral Q12H  . heparin  5,000 Units Subcutaneous Q8H  . insulin aspart  0-9 Units Subcutaneous TID WC  . insulin aspart  3 Units Subcutaneous TID WC  . insulin glargine  15 Units Subcutaneous Daily  . isosorbide mononitrate  30 mg Oral Daily  . mouth rinse  15 mL Mouth Rinse BID  . pantoprazole  40 mg Oral Daily   Continuous Infusions: . sodium chloride 250 mL (02/28/17 0400)     LOS:  7 days    Zaylah Blecha Tanna Furry, MD [facility] Pager [phone]  If 7PM-7AM, please contact night-coverage [email or web address] Password [email or web address] 03/03/2017, 2:35 PM

## 2017-03-04 DIAGNOSIS — L989 Disorder of the skin and subcutaneous tissue, unspecified: Secondary | ICD-10-CM

## 2017-03-04 DIAGNOSIS — E1151 Type 2 diabetes mellitus with diabetic peripheral angiopathy without gangrene: Secondary | ICD-10-CM

## 2017-03-04 DIAGNOSIS — E1165 Type 2 diabetes mellitus with hyperglycemia: Secondary | ICD-10-CM

## 2017-03-04 LAB — RENAL FUNCTION PANEL
Albumin: 2.5 g/dL — ABNORMAL LOW (ref 3.5–5.0)
Anion gap: 10 (ref 5–15)
BUN: 41 mg/dL — ABNORMAL HIGH (ref 6–20)
CO2: 24 mmol/L (ref 22–32)
Calcium: 8.6 mg/dL — ABNORMAL LOW (ref 8.9–10.3)
Chloride: 107 mmol/L (ref 101–111)
Creatinine, Ser: 3.97 mg/dL — ABNORMAL HIGH (ref 0.44–1.00)
GFR calc Af Amer: 12 mL/min — ABNORMAL LOW (ref >60)
GFR calc non Af Amer: 10 mL/min — ABNORMAL LOW (ref >60)
Glucose, Bld: 123 mg/dL — ABNORMAL HIGH (ref 65–99)
Phosphorus: 3.8 mg/dL (ref 2.5–4.6)
Potassium: 4.2 mmol/L (ref 3.5–5.1)
Sodium: 141 mmol/L (ref 135–145)

## 2017-03-04 LAB — GLUCOSE, CAPILLARY
Glucose-Capillary: 78 mg/dL (ref 65–99)
Glucose-Capillary: 167 mg/dL — ABNORMAL HIGH (ref 65–99)
Glucose-Capillary: 204 mg/dL — ABNORMAL HIGH (ref 65–99)

## 2017-03-04 LAB — APTT: aPTT: 51 seconds — ABNORMAL HIGH (ref 24–36)

## 2017-03-04 LAB — MAGNESIUM: Magnesium: 1.3 mg/dL — ABNORMAL LOW (ref 1.7–2.4)

## 2017-03-04 MED ORDER — RANITIDINE HCL 150 MG PO TABS: 150.0000 mg | ORAL_TABLET | Freq: Every day | ORAL | 3 refills | Status: DC

## 2017-03-04 MED ORDER — PREGABALIN 100 MG PO CAPS: 50.0000 mg | ORAL_CAPSULE | Freq: Every day | ORAL | Status: DC

## 2017-03-04 MED ORDER — ATENOLOL 25 MG PO TABS: 25.0000 mg | ORAL_TABLET | Freq: Every day | ORAL | 0 refills | Status: DC

## 2017-03-04 MED ORDER — INSULIN GLARGINE 100 UNIT/ML SOLOSTAR PEN: 15.0000 [IU] | PEN_INJECTOR | Freq: Every day | SUBCUTANEOUS | 0 refills | Status: DC

## 2017-03-04 MED ORDER — PREGABALIN 100 MG PO CAPS: 50.0000 mg | ORAL_CAPSULE | Freq: Three times a day (TID) | ORAL | Status: DC

## 2017-03-04 MED ORDER — DOXYCYCLINE HYCLATE 100 MG PO TABS: 100.0000 mg | ORAL_TABLET | Freq: Two times a day (BID) | ORAL | 0 refills | Status: AC

## 2017-03-04 MED ORDER — MAGNESIUM SULFATE 2 GM/50ML IV SOLN
2.0000 g | Freq: Once | INTRAVENOUS | Status: AC
  Administered 2017-03-04: 2 g via INTRAVENOUS
  Filled 2017-03-04: qty 50

## 2017-03-04 NOTE — Discharge Summary (Addendum)
Physician Discharge Summary  [name] [name] LZJ:[id number] DOB: 10-15-1945 DOA: 02/24/2017  PCP: Bartholomew Crews, MD  Admit date: 02/24/2017 Discharge date: 03/04/2017  Admitted From:Admitted by PCCM from home Disposition: dc home  Recommendations for Outpatient Follow-up:  1. Follow up with PCP and nephrologist in 1-2 weeks 2. Please obtain BMP/CBC in one week  Home Health: Yes Equipment/Devices: none Discharge Condition:stable CODE STATUS:full code Diet recommendation:heart healthy/carb modified.  Brief/Interim Summary:70 yr female with long term DM, HTN, CAD with Cath 0240, diastolic CHF but EF >97%, DJD, Cervical stenosis, lumbar disc dz, DM neuropathy, dyslipidemia, obesity &depression who was admitted on 02/24/2017 by pulmonary critical care due to generalized weakness, metabolic encephalopathy with hypovolemic shock, acute renal failure, she required endotracheal intubation due to encephalopathy and failure to protect airway. She was extubated on 02/28/2017 and transferred to [facility]. She was required CRRT in ICU for renal failure.  #  Acute kidney injury required CRRT via right IJ temporary catheter in ICU. Acute kidney injury thought to be due to dehydration and concurrent use of Lasix and ACE inhibitor and NSAIDs use. The last dialysis on 5/19.  -Patient has improvement in urine output and serum creatinine level trending down. Serum creatinine level 3.9 today. I discussed with Dr. Justin Mend from nephrology. He recommended to discontinue dialysis catheter and possible discharge with outpatient follow-up. I discussed this with the patient's nurse as well as with the patient and her daughter over the phone in detail. It was advised to follow-up with nephrologist within a week and have repeat lab. He verbalized understanding. Patient was educated to avoid NSAIDs.  #1 out of 2 blood culture positive for coag-negative staph likely contaminated. Patient also likely with mild bronchitis as there  are staph aureus in the sputum. No sign of pneumonia. Continue with oral doxycycline. Continue to monitor. Patient has no shortness of breath or cough. Clinically improved.  #Left wrist superficial wound at the site of IV infiltration: The wound was examined which is clean blisters. Patient requires clean dressing with vaseline and gauze at home. Home care services arranged. Discussed with the patient's daughter over the phone in detail regarding clean dressing and wound care. Patient is on oral doxycycline for other indication.  #CAD with mild elevation in troponin on admission likely demand ischemia. Echo with no wall motion abnormalities. Patient has no chest pain or shortness of breath. Continue aspirin, atenolol, Lipitor and Imdur.  #Diabetes on insulin: Added aspart insulin before meals. Continue Lantus and sliding scale. Monitor blood sugar level.  #Chronic neck pain, right elbow pain with history of degenerative joint disease: Continue supportive care and physical therapy. Patient also likely with chronic physical deconditioning.  #Acute metabolic encephalopathy requiring mechanical ventilation for airway protection. Now resolved.  #Dyslipidemia: Continue statin  #Hypertension: Continue Imdur and beta blocker. Monitor blood pressure.  #Chronic diastolic congestive heart failure: Continue current cardiac medications. She has no chest pain or shortness of breath.  # Anemia due to chronic kidney disease: Hemoglobin is stable.  #Hypomagnesemia: Replete IV magnesium. Continue home oral admission chloride. Recommended outpatient follow-up.  Patient's kidney function improving slowly. Left wrist superficial open blisters are clean on examination. No sign of infection. Continue dressing change. Home care arranged. Discussed with the nephrologist, patient and her daughter in detail. At this time patient is medically stable to discharge home with close outpatient follow-up.  Addendum:  for coding clarification Patient had shock on admission likely septic vs hypovolumic: improved on discharge Acute kidney injury likely ATN, gradually  improving on discharge.   Discharge Diagnoses:  Active Problems:   Acute renal failure (ARF) (HCC)   Acute respiratory failure with hypoxemia (HCC)   Nasogastric tube present   Endotracheal tube present   History of ETT   AKI (acute kidney injury) [facility])    Discharge Instructions  Discharge Instructions    Call MD for:  difficulty breathing, headache or visual disturbances    Complete by:  As directed    Call MD for:  extreme fatigue    Complete by:  As directed    Call MD for:  hives    Complete by:  As directed    Call MD for:  persistant dizziness or light-headedness    Complete by:  As directed    Call MD for:  persistant nausea and vomiting    Complete by:  As directed    Call MD for:  severe uncontrolled pain    Complete by:  As directed    Call MD for:  temperature >100.4    Complete by:  As directed    Diet - low sodium heart healthy    Complete by:  As directed    Diet Carb Modified    Complete by:  As directed    Discharge instructions    Complete by:  As directed    Please maintain Left wrist wound clean, dressing change. Follow up with your PCP and nephrologist in one week. You need repeat lab (CBC, BMP) in one week.   Increase activity slowly    Complete by:  As directed      Allergies as of 03/04/2017   No Known Allergies     Medication List    STOP taking these medications   amLODipine 5 MG tablet Commonly known as:  NORVASC   benazepril 40 MG tablet Commonly known as:  LOTENSIN   diclofenac 75 MG EC tablet Commonly known as:  VOLTAREN   diclofenac sodium 1 % Gel Commonly known as:  VOLTAREN   furosemide 40 MG tablet Commonly known as:  LASIX   metFORMIN 1000 MG tablet Commonly known as:  GLUCOPHAGE   NOVOFINE PLUS 32G X 4 MM Misc Generic drug:  Insulin Pen Needle   potassium chloride  10 MEQ tablet Commonly known as:  K-DUR     TAKE these medications   Acetaminophen 650 MG Tabs Take 650 mg by mouth 3 (three) times daily.   aspirin 81 MG tablet Take 1 tablet (81 mg total) by mouth daily.   ASSURE COMFORT LANCETS 30G Misc E11.59. Use to check blood sugar three times a day   atenolol 25 MG tablet Commonly known as:  TENORMIN Take 1 tablet (25 mg total) by mouth daily. Start taking on:  03/05/2017 What changed:  medication strength  how much to take   atorvastatin 10 MG tablet Commonly known as:  LIPITOR Take 1 tablet (10 mg total) by mouth daily.   calcium citrate-vitamin D 500-400 MG-UNIT chewable tablet Chew 1 tablet by mouth daily.   doxycycline 100 MG tablet Commonly known as:  VIBRA-TABS Take 1 tablet (100 mg total) by mouth every 12 (twelve) hours.   glucose blood test strip Commonly known as:  ACCU-CHEK AVIVA PLUS E11.59. Use to check blood sugar three times a day   Insulin Glargine 100 UNIT/ML Solostar Pen Commonly known as:  LANTUS SOLOSTAR Inject 15 Units into the skin daily at 10 pm. What changed:  how much to take   isosorbide mononitrate 30 MG 24  hr tablet Commonly known as:  IMDUR Take 1 tablet (30 mg total) by mouth daily.   lidocaine 5 % ointment Commonly known as:  XYLOCAINE Apply 2 grams up to 4 times daily to affected area   loratadine 10 MG tablet Commonly known as:  CLARITIN take 1 tablet by mouth once daily   magnesium chloride 64 MG Tbec SR tablet Commonly known as:  SLOW-MAG Take 1 tablet (64 mg total) by mouth daily.   nitroGLYCERIN 0.4 MG SL tablet Commonly known as:  NITROSTAT Place 1 tablet (0.4 mg total) under the tongue every 5 (five) minutes as needed for chest pain.   nystatin powder Commonly known as:  nystatin Apply topically 4 (four) times daily.   PARoxetine 40 MG tablet Commonly known as:  PAXIL take 1 tablet by mouth every morning   pregabalin 100 MG capsule Commonly known as:  LYRICA Take  1 capsule (100 mg total) by mouth daily. What changed:  when to take this   ranitidine 150 MG tablet Commonly known as:  ZANTAC Take 1 tablet (150 mg total) by mouth daily. What changed:  when to take this   traMADol 50 MG tablet Commonly known as:  ULTRAM Take 1 tablet (50 mg total) by mouth every 12 (twelve) hours as needed.   VICTOZA 18 MG/3ML Sopn Generic drug:  liraglutide inject 1.2 milligram subcutaneously daily   zolpidem 6.25 MG CR tablet Commonly known as:  AMBIEN CR Take 1 tablet (6.25 mg total) by mouth at bedtime as needed for sleep.      Follow-up Information    Bartholomew Crews, MD. Schedule an appointment as soon as possible for a visit in 1 week(s).   Specialty:  Internal Medicine Contact information: [place] [address] [phone]        Edrick Oh, MD. Schedule an appointment as soon as possible for a visit in 1 week(s).   Specialty:  Nephrology Why:  please call to make appointment within a week and for lab test. Contact information: [place] [address] [phone]          No Known Allergies  Consultations:  Nephrologist  Admitted by the pulmonary critical care  Procedures/Studies: CT head 5/16 > negative. CT A / P 5/16 > appendix slightly enlarged but no surrounding inflammatory changes. CXR 5/21 > mild bilateral bases Echo 5/17 > Vigorous LV systolic function; mild diastolic dysfunction;sclerotic aortic valve with mild AI; mild MR; mild TR with mildlyelevated pulmonary pressure. Renal US 5/16 > Unremarkable Needed endotracheal intubation on the day of admission extubated 02/28/2017  Subjective: Patient was sitting on chair comfortably. Denied headache, dizziness, nausea, ointment, chest pain, shortness of breath. Reports good urine output. No diarrhea or constipation.  Discharge Exam: Vitals:   03/04/17 0810 03/04/17 1040  BP: (!) 121/50 (!) 119/49  Pulse: 80 64  Resp:    Temp:  98.4 F (36.9 C)    Vitals:   03/03/17 2100 03/04/17 0615 03/04/17 0810 03/04/17 1040  BP: (!) 111/56 (!) 113/44 (!) 121/50 (!) 119/49  Pulse: 80 79 80 64  Resp: 18 18    Temp: 98.8 F (37.1 C) 98.7 F (37.1 C) 98.4 F (36.9 C)   TempSrc: Oral Oral Oral   SpO2: 100% 100% 100%   Weight: 99.5 kg (219 lb 5.7 oz)     Height:        General: Pt is alert, awake, not in acute distress Cardiovascular: RRR, S1/S2 +, no  rubs, no gallops Respiratory: CTA bilaterally, no wheezing, no rhonchi Abdominal: Soft, NT, ND, bowel sounds + Extremities: no edema, no cyanosis. Left wrist dorsal area has superficial open blisters, no sign of infection or discharge. No lower extremity edema.   The results of significant diagnostics from this hospitalization (including imaging, microbiology, ancillary and laboratory) are listed below for reference.     Microbiology: Recent Results (from the past 240 hour(s))  Blood Culture (routine x 2)     Status: Abnormal   Collection Time: 02/24/17  4:00 PM  Result Value Ref Range Status   Specimen Description BLOOD RIGHT ANTECUBITAL  Final   Special Requests   Final    BOTTLES DRAWN AEROBIC AND ANAEROBIC Blood Culture adequate volume   Culture  Setup Time   Final    GRAM POSITIVE COCCI ANAEROBIC BOTTLE ONLY CRITICAL RESULT CALLED TO, READ BACK BY AND VERIFIED WITH: Ferne Coe PHARMD 1839 02/25/17 A BROWNING    Culture (A)  Final    STAPHYLOCOCCUS SPECIES (COAGULASE NEGATIVE) THE SIGNIFICANCE OF ISOLATING THIS ORGANISM FROM A SINGLE SET OF BLOOD CULTURES WHEN MULTIPLE SETS ARE DRAWN IS UNCERTAIN. PLEASE NOTIFY THE MICROBIOLOGY DEPARTMENT WITHIN ONE WEEK IF SPECIATION AND SENSITIVITIES ARE REQUIRED.    Report Status 02/27/2017 FINAL  Final  Blood Culture ID Panel (Reflexed)     Status: Abnormal   Collection Time: 02/24/17  4:00 PM  Result Value Ref Range Status   Enterococcus species NOT DETECTED NOT DETECTED Final   Listeria monocytogenes NOT DETECTED NOT  DETECTED Final   Staphylococcus species DETECTED (A) NOT DETECTED Final    Comment: Methicillin (oxacillin) susceptible coagulase negative staphylococcus. Possible blood culture contaminant (unless isolated from more than one blood culture draw or clinical case suggests pathogenicity). No antibiotic treatment is indicated for blood  culture contaminants. CRITICAL RESULT CALLED TO, READ BACK BY AND VERIFIED WITH: Ferne Coe PHARMD [id number] 02/25/17 A BROWNING    Staphylococcus aureus NOT DETECTED NOT DETECTED Final   Methicillin resistance NOT DETECTED NOT DETECTED Final   Streptococcus species NOT DETECTED NOT DETECTED Final   Streptococcus agalactiae NOT DETECTED NOT DETECTED Final   Streptococcus pneumoniae NOT DETECTED NOT DETECTED Final   Streptococcus pyogenes NOT DETECTED NOT DETECTED Final   Acinetobacter baumannii NOT DETECTED NOT DETECTED Final   Enterobacteriaceae species NOT DETECTED NOT DETECTED Final   Enterobacter cloacae complex NOT DETECTED NOT DETECTED Final   Escherichia coli NOT DETECTED NOT DETECTED Final   Klebsiella oxytoca NOT DETECTED NOT DETECTED Final   Klebsiella pneumoniae NOT DETECTED NOT DETECTED Final   Proteus species NOT DETECTED NOT DETECTED Final   Serratia marcescens NOT DETECTED NOT DETECTED Final   Haemophilus influenzae NOT DETECTED NOT DETECTED Final   Neisseria meningitidis NOT DETECTED NOT DETECTED Final   Pseudomonas aeruginosa NOT DETECTED NOT DETECTED Final   Candida albicans NOT DETECTED NOT DETECTED Final   Candida glabrata NOT DETECTED NOT DETECTED Final   Candida krusei NOT DETECTED NOT DETECTED Final   Candida parapsilosis NOT DETECTED NOT DETECTED Final   Candida tropicalis NOT DETECTED NOT DETECTED Final  Blood Culture (routine x 2)     Status: None   Collection Time: 02/24/17  4:45 PM  Result Value Ref Range Status   Specimen Description BLOOD RIGHT HAND  Final   Special Requests   Final    BOTTLES DRAWN AEROBIC AND ANAEROBIC Blood  Culture adequate volume   Culture NO GROWTH 5 DAYS  Final   Report Status 03/01/2017 FINAL  Final  Urine culture     Status: None   Collection Time: 02/24/17  6:36 PM  Result Value Ref Range Status   Specimen Description URINE, CATHETERIZED  Final   Special Requests NONE  Final   Culture NO GROWTH  Final   Report Status 02/26/2017 FINAL  Final  MRSA PCR Screening     Status: Abnormal   Collection Time: 02/24/17 10:00 PM  Result Value Ref Range Status   MRSA by PCR (A) NEGATIVE Final    INVALID, UNABLE TO DETERMINE THE PRESENCE OF TARGET DNA DUE TO SPECIMEN INTEGRITY. RECOLLECTION REQUESTED.    Comment:        The GeneXpert MRSA Assay (FDA approved for NASAL specimens only), is one component of a comprehensive MRSA colonization surveillance program. It is not intended to diagnose MRSA infection nor to guide or monitor treatment for MRSA infections. CALLED TO V.SMITH,RN AT 0424 02/25/17 BY L.PITT   MRSA culture     Status: None   Collection Time: 02/24/17 10:00 PM  Result Value Ref Range Status   Specimen Description NOSE  Final   Special Requests NONE  Final   Culture NO MRSA DETECTED  Final   Report Status 02/27/2017 FINAL  Final  Culture, respiratory (NON-Expectorated)     Status: None   Collection Time: 02/25/17  8:12 PM  Result Value Ref Range Status   Specimen Description TRACHEAL ASPIRATE  Final   Special Requests NONE  Final   Gram Stain   Final    MODERATE WBC PRESENT,BOTH PMN AND MONONUCLEAR NO ORGANISMS SEEN    Culture RARE METHICILLIN RESISTANT STAPHYLOCOCCUS AUREUS  Final   Report Status 02/28/2017 FINAL  Final   Organism ID, Bacteria METHICILLIN RESISTANT STAPHYLOCOCCUS AUREUS  Final      Susceptibility   Methicillin resistant staphylococcus aureus - MIC*    CIPROFLOXACIN >=8 RESISTANT Resistant     ERYTHROMYCIN <=0.25 SENSITIVE Sensitive     GENTAMICIN <=0.5 SENSITIVE Sensitive     OXACILLIN >=4 RESISTANT Resistant     TETRACYCLINE <=1 SENSITIVE  Sensitive     VANCOMYCIN 1 SENSITIVE Sensitive     TRIMETH/SULFA <=10 SENSITIVE Sensitive     CLINDAMYCIN <=0.25 SENSITIVE Sensitive     RIFAMPIN <=0.5 SENSITIVE Sensitive     Inducible Clindamycin NEGATIVE Sensitive     * RARE METHICILLIN RESISTANT STAPHYLOCOCCUS AUREUS     Labs: BNP (last 3 results) No results for input(s): BNP in the last 8760 hours. Basic Metabolic Panel:  Recent Labs Lab 02/28/17 0345 02/28/17 0346 03/01/17 0525 03/02/17 0831 03/03/17 0655 03/04/17 0438  NA 139  --  135 137 141 141  K 3.4*  --  3.9 4.0 3.8 4.2  CL 102  --  97* 100* 101 107  CO2 28  --  26 26 27 24   GLUCOSE 118*  --  273* 130* 78 123*  BUN 21*  --  30* 36* 40* 41*  CREATININE 2.87*  --  3.91* 4.62* 4.39* 3.97*  CALCIUM 7.7*  --  8.0* 8.5* 8.8* 8.6*  MG  --  2.2 2.0 1.7 1.5* 1.3*  PHOS 5.7*  --  4.7* 5.4* 4.7* 3.8   Liver Function Tests:  Recent Labs Lab 02/28/17 0345 03/01/17 0525 03/02/17 0831 03/03/17 0655 03/04/17 0438  ALBUMIN 2.0* 2.2* 2.5* 2.5* 2.5*   No results for input(s): LIPASE, AMYLASE in the last 168 hours. No results for input(s): AMMONIA in the last 168 hours. CBC:  Recent Labs Lab 02/26/17 0511 02/27/17 0323 03/01/17 0355  03/01/17 2101 03/03/17 0655  WBC 8.8 9.8 9.8  --  8.1  HGB 8.8* 8.0* 7.0* 8.8* 9.1*  HCT 24.8* 23.0* 20.3* 25.3* 27.0*  MCV 79.5 80.4 81.2  --  81.1  PLT 281 265 249  --  288   Cardiac Enzymes:  Recent Labs Lab 02/25/17 1748 02/25/17 2309 02/26/17 0511 02/26/17 1005 02/26/17 1548  TROPONINI 0.86* 0.81* 0.67* 0.57* 0.43*   BNP: Invalid input(s): POCBNP CBG:  Recent Labs Lab 03/03/17 1150 03/03/17 1632 03/03/17 2144 03/04/17 0758 03/04/17 1119  GLUCAP 279* 245* 80 78 167*   D-Dimer No results for input(s): DDIMER in the last 72 hours. Hgb A1c No results for input(s): HGBA1C in the last 72 hours. Lipid Profile No results for input(s): CHOL, HDL, LDLCALC, TRIG, CHOLHDL, LDLDIRECT in the last 72 hours. Thyroid  function studies No results for input(s): TSH, T4TOTAL, T3FREE, THYROIDAB in the last 72 hours.  Invalid input(s): FREET3 Anemia work up No results for input(s): VITAMINB12, FOLATE, FERRITIN, TIBC, IRON, RETICCTPCT in the last 72 hours. Urinalysis    Component Value Date/Time   COLORURINE YELLOW 02/24/2017 1836   APPEARANCEUR HAZY (A) 02/24/2017 1836   LABSPEC 1.009 02/24/2017 1836   PHURINE 5.0 02/24/2017 1836   GLUCOSEU NEGATIVE 02/24/2017 1836   HGBUR SMALL (A) 02/24/2017 1836   BILIRUBINUR NEGATIVE 02/24/2017 1836   KETONESUR 5 (A) 02/24/2017 1836   PROTEINUR 30 (A) 02/24/2017 1836   NITRITE NEGATIVE 02/24/2017 1836   LEUKOCYTESUR NEGATIVE 02/24/2017 1836   Sepsis Labs Invalid input(s): PROCALCITONIN,  WBC,  LACTICIDVEN Microbiology Recent Results (from the past 240 hour(s))  Blood Culture (routine x 2)     Status: Abnormal   Collection Time: 02/24/17  4:00 PM  Result Value Ref Range Status   Specimen Description BLOOD RIGHT ANTECUBITAL  Final   Special Requests   Final    BOTTLES DRAWN AEROBIC AND ANAEROBIC Blood Culture adequate volume   Culture  Setup Time   Final    GRAM POSITIVE COCCI ANAEROBIC BOTTLE ONLY CRITICAL RESULT CALLED TO, READ BACK BY AND VERIFIED WITH: Ferne Coe PHARMD 1839 02/25/17 A BROWNING    Culture (A)  Final    STAPHYLOCOCCUS SPECIES (COAGULASE NEGATIVE) THE SIGNIFICANCE OF ISOLATING THIS ORGANISM FROM A SINGLE SET OF BLOOD CULTURES WHEN MULTIPLE SETS ARE DRAWN IS UNCERTAIN. PLEASE NOTIFY THE MICROBIOLOGY DEPARTMENT WITHIN ONE WEEK IF SPECIATION AND SENSITIVITIES ARE REQUIRED.    Report Status 02/27/2017 FINAL  Final  Blood Culture ID Panel (Reflexed)     Status: Abnormal   Collection Time: 02/24/17  4:00 PM  Result Value Ref Range Status   Enterococcus species NOT DETECTED NOT DETECTED Final   Listeria monocytogenes NOT DETECTED NOT DETECTED Final   Staphylococcus species DETECTED (A) NOT DETECTED Final    Comment: Methicillin (oxacillin)  susceptible coagulase negative staphylococcus. Possible blood culture contaminant (unless isolated from more than one blood culture draw or clinical case suggests pathogenicity). No antibiotic treatment is indicated for blood  culture contaminants. CRITICAL RESULT CALLED TO, READ BACK BY AND VERIFIED WITH: Ferne Coe PHARMD [id number] 02/25/17 A BROWNING    Staphylococcus aureus NOT DETECTED NOT DETECTED Final   Methicillin resistance NOT DETECTED NOT DETECTED Final   Streptococcus species NOT DETECTED NOT DETECTED Final   Streptococcus agalactiae NOT DETECTED NOT DETECTED Final   Streptococcus pneumoniae NOT DETECTED NOT DETECTED Final   Streptococcus pyogenes NOT DETECTED NOT DETECTED Final   Acinetobacter baumannii NOT DETECTED NOT DETECTED Final   Enterobacteriaceae species NOT DETECTED  NOT DETECTED Final   Enterobacter cloacae complex NOT DETECTED NOT DETECTED Final   Escherichia coli NOT DETECTED NOT DETECTED Final   Klebsiella oxytoca NOT DETECTED NOT DETECTED Final   Klebsiella pneumoniae NOT DETECTED NOT DETECTED Final   Proteus species NOT DETECTED NOT DETECTED Final   Serratia marcescens NOT DETECTED NOT DETECTED Final   Haemophilus influenzae NOT DETECTED NOT DETECTED Final   Neisseria meningitidis NOT DETECTED NOT DETECTED Final   Pseudomonas aeruginosa NOT DETECTED NOT DETECTED Final   Candida albicans NOT DETECTED NOT DETECTED Final   Candida glabrata NOT DETECTED NOT DETECTED Final   Candida krusei NOT DETECTED NOT DETECTED Final   Candida parapsilosis NOT DETECTED NOT DETECTED Final   Candida tropicalis NOT DETECTED NOT DETECTED Final  Blood Culture (routine x 2)     Status: None   Collection Time: 02/24/17  4:45 PM  Result Value Ref Range Status   Specimen Description BLOOD RIGHT HAND  Final   Special Requests   Final    BOTTLES DRAWN AEROBIC AND ANAEROBIC Blood Culture adequate volume   Culture NO GROWTH 5 DAYS  Final   Report Status 03/01/2017 FINAL  Final  Urine culture      Status: None   Collection Time: 02/24/17  6:36 PM  Result Value Ref Range Status   Specimen Description URINE, CATHETERIZED  Final   Special Requests NONE  Final   Culture NO GROWTH  Final   Report Status 02/26/2017 FINAL  Final  MRSA PCR Screening     Status: Abnormal   Collection Time: 02/24/17 10:00 PM  Result Value Ref Range Status   MRSA by PCR (A) NEGATIVE Final    INVALID, UNABLE TO DETERMINE THE PRESENCE OF TARGET DNA DUE TO SPECIMEN INTEGRITY. RECOLLECTION REQUESTED.    Comment:        The GeneXpert MRSA Assay (FDA approved for NASAL specimens only), is one component of a comprehensive MRSA colonization surveillance program. It is not intended to diagnose MRSA infection nor to guide or monitor treatment for MRSA infections. CALLED TO V.SMITH,RN AT 0424 02/25/17 BY L.PITT   MRSA culture     Status: None   Collection Time: 02/24/17 10:00 PM  Result Value Ref Range Status   Specimen Description NOSE  Final   Special Requests NONE  Final   Culture NO MRSA DETECTED  Final   Report Status 02/27/2017 FINAL  Final  Culture, respiratory (NON-Expectorated)     Status: None   Collection Time: 02/25/17  8:12 PM  Result Value Ref Range Status   Specimen Description TRACHEAL ASPIRATE  Final   Special Requests NONE  Final   Gram Stain   Final    MODERATE WBC PRESENT,BOTH PMN AND MONONUCLEAR NO ORGANISMS SEEN    Culture RARE METHICILLIN RESISTANT STAPHYLOCOCCUS AUREUS  Final   Report Status 02/28/2017 FINAL  Final   Organism ID, Bacteria METHICILLIN RESISTANT STAPHYLOCOCCUS AUREUS  Final      Susceptibility   Methicillin resistant staphylococcus aureus - MIC*    CIPROFLOXACIN >=8 RESISTANT Resistant     ERYTHROMYCIN <=0.25 SENSITIVE Sensitive     GENTAMICIN <=0.5 SENSITIVE Sensitive     OXACILLIN >=4 RESISTANT Resistant     TETRACYCLINE <=1 SENSITIVE Sensitive     VANCOMYCIN 1 SENSITIVE Sensitive     TRIMETH/SULFA <=10 SENSITIVE Sensitive     CLINDAMYCIN <=0.25  SENSITIVE Sensitive     RIFAMPIN <=0.5 SENSITIVE Sensitive     Inducible Clindamycin NEGATIVE Sensitive     *  RARE METHICILLIN RESISTANT STAPHYLOCOCCUS AUREUS     Time coordinating discharge: Over 30 minutes  SIGNED:   Rosita Fire, MD  [facility] 03/04/2017, 1:39 PM  If 7PM-7AM, please contact night-coverage [email or web address] Password [email or web address]

## 2017-03-04 NOTE — Progress Notes (Signed)
Spoke with patients daughter, Hoy Register, who says patient will have a ride this evening

## 2017-03-04 NOTE — Care Management Note (Signed)
Case Management Note  Patient Details  Name: [name] [name] MRN: [id number] Date of Birth: 03-11-1946  Subjective/Objective:            CM following for progression and d/c planning.         Action/Plan: 03/04/2017 Met with pt re [place] needs, pt selected [facility] for [facility] services. Pt has supportive family, daughter will learn to change left hand dressing, friend stays with pt at night. HHRN, HHPT, HHOT and [place] aide requested and [facility] notified of orders and plan to d/c to home today.  Expected Discharge Date:  03/04/17               Expected Discharge Plan:  [place]  In-House Referral:  Clinical Social Work  Discharge planning Services  CM Consult  Post Acute Care Choice:  Home Health Choice offered to:  Patient  DME Arranged:  N/A DME Agency:  NA  HH Arranged:  RN, PT, OT, Nurse's Aide [place] Agency:  [facility] (now [facility])  Status of Service:  Completed, signed off  If discussed at [employer] of Stay Meetings, dates discussed:    Additional Comments:  Adron Bene, RN 03/04/2017, 2:28 PM

## 2017-03-04 NOTE — Progress Notes (Signed)
[facility] ROUNDING NOTE   Subjective:   Interval History: Pt is a 71 y.o.yo femalewith DM and HTN but creatinine of 1.1 on 3/1/18who was admitted on 5/16/2018with seems like sepsis syndrome with AKI- severe acidosis and hyperkalemia- home meds included ACE, NSAID and potassium  She required brief support with CRRT and is now making urine No dialysis since 5/19  Objective:  Vital signs in last 24 hours:  Temp:  [98.2 F (36.8 C)-98.8 F (37.1 C)] 98.7 F (37.1 C) (05/24 0615) Pulse Rate:  [65-80] 79 (05/24 0615) Resp:  [18] 18 (05/24 0615) BP: (109-113)/(44-56) 113/44 (05/24 0615) SpO2:  [99 %-100 %] 100 % (05/24 0615) Weight:  [219 lb 5.7 oz (99.5 kg)] 219 lb 5.7 oz (99.5 kg) (05/23 2100)  Weight change: -3.5 oz (-0.1 kg) Filed Weights   03/01/17 2207 03/02/17 2239 03/03/17 2100  Weight: 217 lb 13 oz (98.8 kg) 219 lb 9.3 oz (99.6 kg) 219 lb 5.7 oz (99.5 kg)    Intake/Output: I/O last 3 completed shifts: In: 580 [P.O.:580] Out: 1250 [Urine:1250]   Intake/Output this shift:  Total I/O In: 240 [P.O.:240] Out: 350 [Urine:350]  CVS- RRR RS- CTA ABD- BS present soft non-distended EXT- no edema   Basic Metabolic Panel:  Recent Labs Lab 02/28/17 0345 02/28/17 0346 03/01/17 0525 03/02/17 0831 03/03/17 0655 03/04/17 0438  NA 139  --  135 137 141 141  K 3.4*  --  3.9 4.0 3.8 4.2  CL 102  --  97* 100* 101 107  CO2 28  --  26 26 27 24   GLUCOSE 118*  --  273* 130* 78 123*  BUN 21*  --  30* 36* 40* 41*  CREATININE 2.87*  --  3.91* 4.62* 4.39* 3.97*  CALCIUM 7.7*  --  8.0* 8.5* 8.8* 8.6*  MG  --  2.2 2.0 1.7 1.5* 1.3*  PHOS 5.7*  --  4.7* 5.4* 4.7* 3.8    Liver Function Tests:  Recent Labs Lab 02/28/17 0345 03/01/17 0525 03/02/17 0831 03/03/17 0655 03/04/17 0438  ALBUMIN 2.0* 2.2* 2.5* 2.5* 2.5*   No results for input(s): LIPASE, AMYLASE in the last 168 hours. No results for input(s): AMMONIA in the last 168 hours.  CBC:  Recent  Labs Lab 02/26/17 0511 02/27/17 0323 03/01/17 0525 03/01/17 2101 03/03/17 0655  WBC 8.8 9.8 9.8  --  8.1  HGB 8.8* 8.0* 7.0* 8.8* 9.1*  HCT 24.8* 23.0* 20.3* 25.3* 27.0*  MCV 79.5 80.4 81.2  --  81.1  PLT 281 265 249  --  288    Cardiac Enzymes:  Recent Labs Lab 02/25/17 1748 02/25/17 2309 02/26/17 0511 02/26/17 1005 02/26/17 1548  TROPONINI 0.86* 0.81* 0.67* 0.57* 0.43*    BNP: Invalid input(s): POCBNP  CBG:  Recent Labs Lab 03/03/17 0740 03/03/17 1150 03/03/17 1632 03/03/17 2144 03/04/17 0758  GLUCAP 84 279* 245* 80 52    Microbiology: Results for orders placed or performed during the hospital encounter of 02/24/17  Blood Culture (routine x 2)     Status: Abnormal   Collection Time: 02/24/17  4:00 PM  Result Value Ref Range Status   Specimen Description BLOOD RIGHT ANTECUBITAL  Final   Special Requests   Final    BOTTLES DRAWN AEROBIC AND ANAEROBIC Blood Culture adequate volume   Culture  Setup Time   Final    GRAM POSITIVE COCCI ANAEROBIC BOTTLE ONLY CRITICAL RESULT CALLED TO, READ BACK BY AND VERIFIED WITHFerne Coe PHARMD 1839 02/25/17 A  BROWNING    Culture (A)  Final    STAPHYLOCOCCUS SPECIES (COAGULASE NEGATIVE) THE SIGNIFICANCE OF ISOLATING THIS ORGANISM FROM A SINGLE SET OF BLOOD CULTURES WHEN MULTIPLE SETS ARE DRAWN IS UNCERTAIN. PLEASE NOTIFY THE MICROBIOLOGY DEPARTMENT WITHIN ONE WEEK IF SPECIATION AND SENSITIVITIES ARE REQUIRED.    Report Status 02/27/2017 FINAL  Final  Blood Culture ID Panel (Reflexed)     Status: Abnormal   Collection Time: 02/24/17  4:00 PM  Result Value Ref Range Status   Enterococcus species NOT DETECTED NOT DETECTED Final   Listeria monocytogenes NOT DETECTED NOT DETECTED Final   Staphylococcus species DETECTED (A) NOT DETECTED Final    Comment: Methicillin (oxacillin) susceptible coagulase negative staphylococcus. Possible blood culture contaminant (unless isolated from more than one blood culture draw or clinical  case suggests pathogenicity). No antibiotic treatment is indicated for blood  culture contaminants. CRITICAL RESULT CALLED TO, READ BACK BY AND VERIFIED WITH: Ferne Coe PHARMD 1839 02/25/17 A BROWNING    Staphylococcus aureus NOT DETECTED NOT DETECTED Final   Methicillin resistance NOT DETECTED NOT DETECTED Final   Streptococcus species NOT DETECTED NOT DETECTED Final   Streptococcus agalactiae NOT DETECTED NOT DETECTED Final   Streptococcus pneumoniae NOT DETECTED NOT DETECTED Final   Streptococcus pyogenes NOT DETECTED NOT DETECTED Final   Acinetobacter baumannii NOT DETECTED NOT DETECTED Final   Enterobacteriaceae species NOT DETECTED NOT DETECTED Final   Enterobacter cloacae complex NOT DETECTED NOT DETECTED Final   Escherichia coli NOT DETECTED NOT DETECTED Final   Klebsiella oxytoca NOT DETECTED NOT DETECTED Final   Klebsiella pneumoniae NOT DETECTED NOT DETECTED Final   Proteus species NOT DETECTED NOT DETECTED Final   Serratia marcescens NOT DETECTED NOT DETECTED Final   Haemophilus influenzae NOT DETECTED NOT DETECTED Final   Neisseria meningitidis NOT DETECTED NOT DETECTED Final   Pseudomonas aeruginosa NOT DETECTED NOT DETECTED Final   Candida albicans NOT DETECTED NOT DETECTED Final   Candida glabrata NOT DETECTED NOT DETECTED Final   Candida krusei NOT DETECTED NOT DETECTED Final   Candida parapsilosis NOT DETECTED NOT DETECTED Final   Candida tropicalis NOT DETECTED NOT DETECTED Final  Blood Culture (routine x 2)     Status: None   Collection Time: 02/24/17  4:45 PM  Result Value Ref Range Status   Specimen Description BLOOD RIGHT HAND  Final   Special Requests   Final    BOTTLES DRAWN AEROBIC AND ANAEROBIC Blood Culture adequate volume   Culture NO GROWTH 5 DAYS  Final   Report Status 03/01/2017 FINAL  Final  Urine culture     Status: None   Collection Time: 02/24/17  6:36 PM  Result Value Ref Range Status   Specimen Description URINE, CATHETERIZED  Final   Special  Requests NONE  Final   Culture NO GROWTH  Final   Report Status 02/26/2017 FINAL  Final  MRSA PCR Screening     Status: Abnormal   Collection Time: 02/24/17 10:00 PM  Result Value Ref Range Status   MRSA by PCR (A) NEGATIVE Final    INVALID, UNABLE TO DETERMINE THE PRESENCE OF TARGET DNA DUE TO SPECIMEN INTEGRITY. RECOLLECTION REQUESTED.    Comment:        The GeneXpert MRSA Assay (FDA approved for NASAL specimens only), is one component of a comprehensive MRSA colonization surveillance program. It is not intended to diagnose MRSA infection nor to guide or monitor treatment for MRSA infections. CALLED TO V.SMITH,RN AT 0424 02/25/17 BY L.PITT   MRSA  culture     Status: None   Collection Time: 02/24/17 10:00 PM  Result Value Ref Range Status   Specimen Description NOSE  Final   Special Requests NONE  Final   Culture NO MRSA DETECTED  Final   Report Status 02/27/2017 FINAL  Final  Culture, respiratory (NON-Expectorated)     Status: None   Collection Time: 02/25/17  8:12 PM  Result Value Ref Range Status   Specimen Description TRACHEAL ASPIRATE  Final   Special Requests NONE  Final   Gram Stain   Final    MODERATE WBC PRESENT,BOTH PMN AND MONONUCLEAR NO ORGANISMS SEEN    Culture RARE METHICILLIN RESISTANT STAPHYLOCOCCUS AUREUS  Final   Report Status 02/28/2017 FINAL  Final   Organism ID, Bacteria METHICILLIN RESISTANT STAPHYLOCOCCUS AUREUS  Final      Susceptibility   Methicillin resistant staphylococcus aureus - MIC*    CIPROFLOXACIN >=8 RESISTANT Resistant     ERYTHROMYCIN <=0.25 SENSITIVE Sensitive     GENTAMICIN <=0.5 SENSITIVE Sensitive     OXACILLIN >=4 RESISTANT Resistant     TETRACYCLINE <=1 SENSITIVE Sensitive     VANCOMYCIN 1 SENSITIVE Sensitive     TRIMETH/SULFA <=10 SENSITIVE Sensitive     CLINDAMYCIN <=0.25 SENSITIVE Sensitive     RIFAMPIN <=0.5 SENSITIVE Sensitive     Inducible Clindamycin NEGATIVE Sensitive     * RARE METHICILLIN RESISTANT STAPHYLOCOCCUS  AUREUS    Coagulation Studies: No results for input(s): LABPROT, INR in the last 72 hours.  Urinalysis: No results for input(s): COLORURINE, LABSPEC, PHURINE, GLUCOSEU, HGBUR, BILIRUBINUR, KETONESUR, PROTEINUR, UROBILINOGEN, NITRITE, LEUKOCYTESUR in the last 72 hours.  Invalid input(s): APPERANCEUR    Imaging: No results found.   Medications:   . sodium chloride 250 mL (02/28/17 0400)   . aspirin  81 mg Per Tube Daily  . atenolol  25 mg Oral Daily  . atorvastatin  10 mg Oral q1800  . doxycycline  100 mg Oral Q12H  . heparin  5,000 Units Subcutaneous Q8H  . insulin aspart  0-9 Units Subcutaneous TID WC  . insulin aspart  3 Units Subcutaneous TID WC  . insulin glargine  15 Units Subcutaneous Daily  . isosorbide mononitrate  30 mg Oral Daily  . mouth rinse  15 mL Mouth Rinse BID  . pantoprazole  40 mg Oral Daily   sodium chloride, HYDROcodone-acetaminophen, ondansetron (ZOFRAN) IV, sodium chloride flush  Assessment/ Plan:  1. Renal-AKI in the setting of extreme hypotension on ACE/NSAID- U/A pretty bland- CT no obstruction. Suspect ATN. RequiredCRRT due to hemodynamic instability/acidosis and hyperkalemia- using heparin with - started overnight 5/16---- stopped 5/19 AM. Will continue to observe Creatinine pending with good urine output - no dialysis needs   Discussed with Dr Carolin Sicks, he will discharge home and we can follow up as outpatient  2. HTN/vol- euvolemic and controlled 3. Anemia-hgb 9.3--8.8--8.0 appears stablewatch for now- transfuse as needed 4. Metabolic acidosis-resolved  5. Hyperkalemia-corrected  6. Sepsis? - on zosyn/vanc- CT negative for source- per CCM- now on  doxycyline 7. Hypophos- resolved      LOS: 8 [name] [name] W @TODAY @9 :52 AM

## 2017-03-04 NOTE — Progress Notes (Signed)
Physical Therapy Treatment Patient Details Name: [name] [name] MRN: [id number] DOB: 13-Apr-1946 Today's Date: 03/04/2017    History of Present Illness Pt is a 71 yo F brought in by daughter from home on 02/24/17 due to c/o abdominal pain, vomitting, diarrhea, slurred speech, and inability to walk. Pt was admitted to critical care, but is now being seen by internal medicine. Pt currently suffers from acute renal failure, CHF, DM type II with diabetic neuropathy, obesity, and chronic pain syndrome in R elbow, lumbar spine, and neck. PMH: depression, dyslipidema, gallstones, GERD, HTN, PVD, s/p: lumbar fusion, rotator cuff surgery, cholecytectomy, L heart chath. PLOF: independent at home using a quad cane or rw living with a friend    PT Comments    Pt demonstrates improved stability and mobility with gait and transfers this session. Pt does require cues for hand placement with transfers to improve safety. Performed short distance gait to bathroom with min guard this session. Pt is expected to DC home this afternoon.     Follow Up Recommendations  Home health PT;Supervision/Assistance - 24 hour     Equipment Recommendations  None recommended by PT    Recommendations for Other Services       Precautions / Restrictions Precautions Precautions: Fall Restrictions Weight Bearing Restrictions: No    Mobility  Bed Mobility Overal bed mobility: Modified Independent             General bed mobility comments: in chair on arrival  Transfers Overall transfer level: Needs assistance Equipment used: Rolling walker (2 wheeled) Transfers: Sit to/from Stand Sit to Stand: Min guard         General transfer comment: cues for hand placement on chair vs RW  Ambulation/Gait Ambulation/Gait assistance: Min guard Ambulation Distance (Feet): 30 Feet Assistive device: Rolling walker (2 wheeled) Gait Pattern/deviations: Shuffle;Wide base of support;Trunk flexed Gait velocity: decreased Gait  velocity interpretation: at or above normal speed for age/gender General Gait Details: Cueing required for postural control. Slow but overall steady gait.   Stairs            Wheelchair Mobility    Modified Rankin (Stroke Patients Only)       Balance                                            Cognition Arousal/Alertness: Awake/alert Behavior During Therapy: WFL for tasks assessed/performed Overall Cognitive Status: Within Functional Limits for tasks assessed                                        Exercises General Exercises - Lower Extremity Ankle Circles/Pumps: AROM;Both;10 reps;Seated Gluteal Sets: AROM;Both;10 reps;Seated Long Arc Quad: AROM;Both;10 reps;Seated    General Comments        Pertinent Vitals/Pain Pain Assessment: No/denies pain    Home Living                      Prior Function            PT Goals (current goals can now be found in the care plan section) Acute Rehab PT Goals Patient Stated Goal: to go home Progress towards PT goals: Progressing toward goals    Frequency    Min 3X/week      PT Plan Current  plan remains appropriate    Co-evaluation              AM-PAC PT "6 Clicks" Daily Activity  Outcome Measure  Difficulty turning over in bed (including adjusting bedclothes, sheets and blankets)?: A Little Difficulty moving from lying on back to sitting on the side of the bed? : A Little Difficulty sitting down on and standing up from a chair with arms (e.g., wheelchair, bedside commode, etc,.)?: A Little Help needed moving to and from a bed to chair (including a wheelchair)?: A Little Help needed walking in hospital room?: A Little Help needed climbing 3-5 steps with a railing? : A Little 6 Click Score: 18    End of Session Equipment Utilized During Treatment: Gait belt Activity Tolerance: Patient tolerated treatment well;No increased pain Patient left: in chair;with call  bell/phone within reach Nurse Communication: Mobility status PT Visit Diagnosis: Muscle weakness (generalized) (M62.81)     Time: 0712-1975 PT Time Calculation (min) (ACUTE ONLY): 11 min  Charges:  $Therapeutic Activity: 8-22 mins                    G Codes:       Scheryl Marten PT, DPT  [phone]    Jacqulyn Liner Sloan Leiter 03/04/2017, 3:20 PM

## 2017-03-04 NOTE — Progress Notes (Signed)
Patient discharged to home with family, AVS reviewed, prescriptions provided, IV and tele were discontinued, patient left floor via wheelchair escorted by staff member

## 2017-03-05 ENCOUNTER — Telehealth: Payer: Self-pay | Admitting: *Deleted

## 2017-03-05 DIAGNOSIS — D631 Anemia in chronic kidney disease: Secondary | ICD-10-CM | POA: Diagnosis not present

## 2017-03-05 DIAGNOSIS — I5032 Chronic diastolic (congestive) heart failure: Secondary | ICD-10-CM | POA: Diagnosis not present

## 2017-03-05 DIAGNOSIS — E785 Hyperlipidemia, unspecified: Secondary | ICD-10-CM | POA: Diagnosis not present

## 2017-03-05 DIAGNOSIS — N179 Acute kidney failure, unspecified: Secondary | ICD-10-CM | POA: Diagnosis not present

## 2017-03-05 DIAGNOSIS — I2510 Atherosclerotic heart disease of native coronary artery without angina pectoris: Secondary | ICD-10-CM | POA: Diagnosis not present

## 2017-03-05 DIAGNOSIS — T8089XD Other complications following infusion, transfusion and therapeutic injection, subsequent encounter: Secondary | ICD-10-CM | POA: Diagnosis not present

## 2017-03-05 DIAGNOSIS — N189 Chronic kidney disease, unspecified: Secondary | ICD-10-CM | POA: Diagnosis not present

## 2017-03-05 DIAGNOSIS — E1122 Type 2 diabetes mellitus with diabetic chronic kidney disease: Secondary | ICD-10-CM | POA: Diagnosis not present

## 2017-03-05 DIAGNOSIS — E1140 Type 2 diabetes mellitus with diabetic neuropathy, unspecified: Secondary | ICD-10-CM | POA: Diagnosis not present

## 2017-03-05 DIAGNOSIS — I130 Hypertensive heart and chronic kidney disease with heart failure and stage 1 through stage 4 chronic kidney disease, or unspecified chronic kidney disease: Secondary | ICD-10-CM | POA: Diagnosis not present

## 2017-03-05 DIAGNOSIS — M4802 Spinal stenosis, cervical region: Secondary | ICD-10-CM | POA: Diagnosis not present

## 2017-03-05 DIAGNOSIS — M5136 Other intervertebral disc degeneration, lumbar region: Secondary | ICD-10-CM | POA: Diagnosis not present

## 2017-03-05 DIAGNOSIS — J9601 Acute respiratory failure with hypoxia: Secondary | ICD-10-CM | POA: Diagnosis not present

## 2017-03-05 NOTE — Telephone Encounter (Signed)
Agree with VO for [facility]

## 2017-03-05 NOTE — Telephone Encounter (Signed)
Call from [facility] for VO for visits 1x for 1 week 2 xweek for 6 weeks, also PT and OT will be calling for VO Has appt in [facility] wed 5/30 for HFU She needed to schedule a f/u with nephrology, I called the office because pt said dr butcher needed to call and had to leave a message for the appt person

## 2017-03-09 ENCOUNTER — Emergency Department (HOSPITAL_COMMUNITY)
Admission: EM | Admit: 2017-03-09 | Discharge: 2017-03-09 | Disposition: A | Discharge status: Home / Self Care | Payer: Medicare Other | Attending: Emergency Medicine | Admitting: Emergency Medicine

## 2017-03-09 ENCOUNTER — Emergency Department (HOSPITAL_COMMUNITY): Payer: Medicare Other

## 2017-03-09 ENCOUNTER — Encounter (HOSPITAL_COMMUNITY): Payer: Self-pay | Admitting: *Deleted

## 2017-03-09 DIAGNOSIS — Z7982 Long term (current) use of aspirin: Secondary | ICD-10-CM | POA: Insufficient documentation

## 2017-03-09 DIAGNOSIS — Z794 Long term (current) use of insulin: Secondary | ICD-10-CM | POA: Diagnosis not present

## 2017-03-09 DIAGNOSIS — Z79899 Other long term (current) drug therapy: Secondary | ICD-10-CM | POA: Diagnosis not present

## 2017-03-09 DIAGNOSIS — R52 Pain, unspecified: Secondary | ICD-10-CM | POA: Diagnosis not present

## 2017-03-09 DIAGNOSIS — Y939 Activity, unspecified: Secondary | ICD-10-CM | POA: Insufficient documentation

## 2017-03-09 DIAGNOSIS — I2510 Atherosclerotic heart disease of native coronary artery without angina pectoris: Secondary | ICD-10-CM | POA: Diagnosis not present

## 2017-03-09 DIAGNOSIS — S301XXA Contusion of abdominal wall, initial encounter: Secondary | ICD-10-CM | POA: Insufficient documentation

## 2017-03-09 DIAGNOSIS — N183 Chronic kidney disease, stage 3 (moderate): Secondary | ICD-10-CM | POA: Insufficient documentation

## 2017-03-09 DIAGNOSIS — E1122 Type 2 diabetes mellitus with diabetic chronic kidney disease: Secondary | ICD-10-CM | POA: Diagnosis not present

## 2017-03-09 DIAGNOSIS — Y929 Unspecified place or not applicable: Secondary | ICD-10-CM | POA: Diagnosis not present

## 2017-03-09 DIAGNOSIS — Z7722 Contact with and (suspected) exposure to environmental tobacco smoke (acute) (chronic): Secondary | ICD-10-CM | POA: Diagnosis not present

## 2017-03-09 DIAGNOSIS — I130 Hypertensive heart and chronic kidney disease with heart failure and stage 1 through stage 4 chronic kidney disease, or unspecified chronic kidney disease: Secondary | ICD-10-CM | POA: Insufficient documentation

## 2017-03-09 DIAGNOSIS — Y999 Unspecified external cause status: Secondary | ICD-10-CM | POA: Insufficient documentation

## 2017-03-09 DIAGNOSIS — R0602 Shortness of breath: Secondary | ICD-10-CM | POA: Diagnosis not present

## 2017-03-09 DIAGNOSIS — T148XXA Other injury of unspecified body region, initial encounter: Secondary | ICD-10-CM

## 2017-03-09 DIAGNOSIS — S3092XA Unspecified superficial injury of abdominal wall, initial encounter: Secondary | ICD-10-CM | POA: Diagnosis present

## 2017-03-09 DIAGNOSIS — X58XXXA Exposure to other specified factors, initial encounter: Secondary | ICD-10-CM | POA: Insufficient documentation

## 2017-03-09 DIAGNOSIS — M5489 Other dorsalgia: Secondary | ICD-10-CM | POA: Diagnosis not present

## 2017-03-09 DIAGNOSIS — M542 Cervicalgia: Secondary | ICD-10-CM | POA: Insufficient documentation

## 2017-03-09 DIAGNOSIS — I5032 Chronic diastolic (congestive) heart failure: Secondary | ICD-10-CM | POA: Diagnosis not present

## 2017-03-09 DIAGNOSIS — M7981 Nontraumatic hematoma of soft tissue: Secondary | ICD-10-CM | POA: Diagnosis not present

## 2017-03-09 DIAGNOSIS — R079 Chest pain, unspecified: Secondary | ICD-10-CM | POA: Diagnosis not present

## 2017-03-09 LAB — URINALYSIS, ROUTINE W REFLEX MICROSCOPIC
Bacteria, UA: NONE SEEN
Bilirubin Urine: NEGATIVE
Glucose, UA: NEGATIVE mg/dL
Ketones, ur: NEGATIVE mg/dL
Leukocytes, UA: NEGATIVE
Nitrite: NEGATIVE
Protein, ur: NEGATIVE mg/dL
Specific Gravity, Urine: 1.011 (ref 1.005–1.030)
pH: 5 (ref 5.0–8.0)

## 2017-03-09 LAB — BASIC METABOLIC PANEL
Anion gap: 13 (ref 5–15)
BUN: 33 mg/dL — ABNORMAL HIGH (ref 6–20)
CO2: 17 mmol/L — ABNORMAL LOW (ref 22–32)
Calcium: 8.3 mg/dL — ABNORMAL LOW (ref 8.9–10.3)
Chloride: 110 mmol/L (ref 101–111)
Creatinine, Ser: 1.61 mg/dL — ABNORMAL HIGH (ref 0.44–1.00)
GFR calc Af Amer: 36 mL/min — ABNORMAL LOW (ref >60)
GFR calc non Af Amer: 31 mL/min — ABNORMAL LOW (ref >60)
Glucose, Bld: 86 mg/dL (ref 65–99)
Potassium: 3.8 mmol/L (ref 3.5–5.1)
Sodium: 140 mmol/L (ref 135–145)

## 2017-03-09 LAB — CBC
HCT: 24.2 % — ABNORMAL LOW (ref 36.0–46.0)
Hemoglobin: 8.1 g/dL — ABNORMAL LOW (ref 12.0–15.0)
MCH: 27.6 pg (ref 26.0–34.0)
MCHC: 33.5 g/dL (ref 30.0–36.0)
MCV: 82.6 fL (ref 78.0–100.0)
Platelets: 478 10*3/uL — ABNORMAL HIGH (ref 150–400)
RBC: 2.93 MIL/uL — ABNORMAL LOW (ref 3.87–5.11)
RDW: 15.8 % — ABNORMAL HIGH (ref 11.5–15.5)
WBC: 16.1 10*3/uL — ABNORMAL HIGH (ref 4.0–10.5)

## 2017-03-09 LAB — I-STAT TROPONIN, ED: Troponin i, poc: 0 ng/mL (ref 0.00–0.08)

## 2017-03-09 MED ORDER — TRAMADOL HCL 50 MG PO TABS
50.0000 mg | ORAL_TABLET | Freq: Once | ORAL | Status: AC
  Administered 2017-03-09: 50 mg via ORAL
  Filled 2017-03-09: qty 1

## 2017-03-09 NOTE — ED Provider Notes (Signed)
[facility] DEPT Provider Note   CSN: [id number] Arrival date & time: 03/09/17  1054     History   Chief Complaint Chief Complaint  Patient presents with  . Shortness of Breath  . Chest Pain    HPI [name] [name] is a 71 y.o. female.  HPI   16 female presents today with complaints of neck pain and shortness of breath. Patient has significant past medical history including recent hospital admission on 02/24/2017. Patient presented with altered mental status secondary to embolic encephalopathy with hypovolemic shock and acute renal failure. Patient was intubated at that time and admitted to the critical-care unit. Patient was discharged on 03/04/2017. Her hospital course was significant for acute renal failure likely secondary to dehydration, use of Lasix, ACE inhibitor, and NSAID. Patient had no significant renal dysfunction prior to admission. Patient also had what appeared to be bronchitis and was started on doxycycline as an outpatient, she continued this therapy. Patient also had a superficial wound to the left hand after IV infiltration.   Patient notes that she was still slightly short of breath at the time of hospital discharge noting she was using oxygen at that time. She notes after discharge she was doing very well at home, up until yesterday. She notes waking up yesterday with severe neck pain, and worsening shortness of breath. She notes no neck pain is located in the cervical region and wraps around the anterior part of her neck into the upper portion of her chest. She denies any other lower chest pain other than that which radiates from her neck. Vision and she's been taking about extracted, home health has been seen her as indicated, and has primary care follow-up tomorrow.  She notes normal urine output, nonproductive cough, denies any abdominal pain, fever or nausea or vomiting. Patient has been using tramadol at home as needed for neck pain that she has a chronic history  of cervical stenosis, present pain is consistent with previous. No distal neurological deficits.  LV EF: 65% -   70%   Past Medical History:  Diagnosis Date  . Arthritis   . CAD 08/26/2006   Cath 07/05:multiple areas of nonobstructive disease = medical & RF mgmt, well preserved global systolic function. Dr. Lia Foyer. Nuclear med stress test 01/2014 : Normal stress nuclear study. LV Ejection Fraction: 76%. LV Wall Motion: NL LV Function; NL Wall Motion.      . Chronic pain syndrome 01/03/2013   Multifactorial. Non opioid requiring.   L2-5 fusion, with hardware at L2-3, stable per MRI 2010. Followed by neurosurgery (Dr. 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    [name] DEPRESSION 08/26/2006   On paxil    . DIABETES MELLITUS, TYPE II 08/26/2006   Insulin dependent. Victoza added 03/2014  . DYSLIPIDEMIA 11/01/2006   LDL goal < 100, 70 if can achieve without side effects.     . Gallstones   . GERD 08/26/2006   Heartburn QHS.     [name] HYPERTENSION 08/26/2006   On BB, ACEI, lasix, norvasc, metolazone, and imdur.     . OBESITY 08/26/2006   BMI 39.5. Obesity Class 2.     . PVD (peripheral vascular disease) ([place]) 03/27/2014   2015 LE arterial Duplex - Possible mild inflow disease on the left. 0-49% bilateral SFA disease, without focal stenosis. Three vesel run-off, bilaterally. Medical tx.     Patient Active Problem List   Diagnosis Date Noted  . Superficial  skin lesion   . AKI (acute kidney injury) ([place])   . Nasogastric tube present   . Endotracheal tube present   . History of ETT   . Acute renal failure (ARF) ([place]) 02/24/2017  . Acute respiratory failure with hypoxemia ([place])   . CKD stage 3 due to type 2 diabetes mellitus ([place]) 12/11/2016  . Diabetic neuropathy ([place]) 12/10/2016  . Small intestinal bacterial overgrowth 12/24/2015  . Goals of care, counseling/discussion 01/18/2015  . ASCUS with positive high risk HPV 08/03/2014  .  PVD (peripheral vascular disease) ([place]) 03/27/2014  . Diastolic CHF, chronic ([place]) 12/21/2013  . Healthcare maintenance 04/25/2013  . Chronic pain syndrome 01/03/2013  . Insomnia 12/22/2011  . Vitamin B12 deficiency 12/27/2009  . Vitamin D deficiency 12/27/2009  . Anemia 12/27/2009  . Hypomagnesemia 12/29/2008  . Osteopenia 08/01/2008  . Hyperlipidemia 11/01/2006  . GLAUCOMA NOS 11/01/2006  . DM (diabetes mellitus), type 2, uncontrolled, periph vascular complic ([place]) 54/65/0354  . Obesity 08/26/2006  . Depression 08/26/2006  . HTN (hypertension) 08/26/2006  . Coronary atherosclerosis 08/26/2006  . GERD 08/26/2006    Past Surgical History:  Procedure Laterality Date  . CHOLECYSTECTOMY     pt denies 04/07/16  . endometrial biospy    . LEFT HEART CATH    . lumbar fusion surgery  10/08   L3-L5  . posterior lumbar interfusion surgery  07/18/07   L2-L3  . rotator cuff surgery Bilateral     OB History    No data available      Home Medications    Prior to Admission medications   Medication Sig Start Date End Date Taking? Authorizing Provider  Acetaminophen 650 MG TABS Take 650 mg by mouth 3 (three) times daily.   Yes [provider]  aspirin 81 MG tablet Take 1 tablet (81 mg total) by mouth daily. 04/25/13  Yes Bartholomew Crews, MD  ASSURE COMFORT LANCETS 30G MISC E11.59. Use to check blood sugar three times a day 12/23/16  Yes Bartholomew Crews, MD  atenolol (TENORMIN) 25 MG tablet Take 1 tablet (25 mg total) by mouth daily. 03/05/17  Yes Rosita Fire, MD  atorvastatin (LIPITOR) 10 MG tablet Take 1 tablet (10 mg total) by mouth daily. 08/12/16  Yes Dorothy Spark, MD  calcium citrate-vitamin D 500-400 MG-UNIT chewable tablet Chew 1 tablet by mouth daily.   Yes [provider]  glucose blood (ACCU-CHEK AVIVA PLUS) test strip E11.59. Use to check blood sugar three times a day 12/23/16  Yes Bartholomew Crews, MD  Insulin Glargine (LANTUS  SOLOSTAR) 100 UNIT/ML Solostar Pen Inject 15 Units into the skin daily at 10 pm. 03/04/17  Yes Rosita Fire, MD  isosorbide mononitrate (IMDUR) 30 MG 24 hr tablet Take 1 tablet (30 mg total) by mouth daily. 08/12/16  Yes Dorothy Spark, MD  lidocaine (XYLOCAINE) 5 % ointment Apply 2 grams up to 4 times daily to affected area 02/25/16  Yes Draper, Carlos Levering, DO  loratadine (CLARITIN) 10 MG tablet take 1 tablet by mouth once daily 01/29/15  Yes Bartholomew Crews, MD  magnesium chloride (SLOW-MAG) 64 MG TBEC SR tablet Take 1 tablet (64 mg total) by mouth daily. 12/30/15  Yes Bartholomew Crews, MD  nitroGLYCERIN (NITROSTAT) 0.4 MG SL tablet Place 1 tablet (0.4 mg total) under the tongue every 5 (five) minutes as needed for chest pain. 08/12/16  Yes Dorothy Spark, MD  nystatin (NYSTATIN) powder Apply topically 4 (four) times daily.  12/10/16  Yes Bartholomew Crews, MD  PARoxetine (PAXIL) 40 MG tablet take 1 tablet by mouth every morning 06/02/16  Yes Bartholomew Crews, MD  pregabalin (LYRICA) 100 MG capsule Take 1 capsule (100 mg total) by mouth daily. 03/04/17  Yes Rosita Fire, MD  ranitidine (ZANTAC) 150 MG tablet Take 1 tablet (150 mg total) by mouth daily. 03/04/17  Yes Rosita Fire, MD  traMADol (ULTRAM) 50 MG tablet Take 1 tablet (50 mg total) by mouth every 12 (twelve) hours as needed. 08/26/16  Yes Thurman Coyer, DO  VICTOZA 18 MG/3ML SOPN inject 1.2 milligram subcutaneously daily 09/08/16  Yes Bartholomew Crews, MD  zolpidem (AMBIEN CR) 6.25 MG CR tablet Take 1 tablet (6.25 mg total) by mouth at bedtime as needed for sleep. 12/10/16  Yes Bartholomew Crews, MD    Family History Family History  Problem Relation Age of Onset  . Cirrhosis Mother   . Diabetes Father   . Hypertension Father   . Hypertension Sister   . Diabetes Sister   . Hyperlipidemia Sister     Social History Social History  Substance Use Topics  . Smoking status: Passive  Smoke Exposure - Never Smoker  . Smokeless tobacco: Never Used  . Alcohol use No     Allergies   Patient has no known allergies.   Review of Systems Review of Systems  All other systems reviewed and are negative.   Physical Exam Updated Vital Signs BP (!) 141/63   Pulse 90   Temp 98.8 F (37.1 C) (Oral)   Resp 16   SpO2 98%   Physical Exam  Constitutional: She is oriented to person, place, and time. She appears well-developed and well-nourished.  HENT:  Head: Normocephalic and atraumatic.  Eyes: Conjunctivae are normal. Pupils are equal, round, and reactive to light. Right eye exhibits no discharge. Left eye exhibits no discharge. No scleral icterus.  Neck: Normal range of motion. No JVD present. No tracheal deviation present.  Cardiovascular: Normal rate and regular rhythm.   Pulmonary/Chest: Effort normal and breath sounds normal. No stridor. No respiratory distress. She has no wheezes. She has no rales. She exhibits no tenderness.  Abdominal: Soft. She exhibits no distension. There is no tenderness.  Musculoskeletal:  Exquisite tenderness posterior cervical spine inserting soft tissue to even light palpation; bilateral upper and lower extremity strength sensation and motor function intact    Neurological: She is alert and oriented to person, place, and time. Coordination normal.  Skin:  Left volar hand with blistering, no sig surrounding cellulitis of purulent drainage   Psychiatric: She has a normal mood and affect. Her behavior is normal. Judgment and thought content normal.  Nursing note and vitals reviewed.   ED Treatments / Results  Labs (all labs ordered are listed, but only abnormal results are displayed) Labs Reviewed  BASIC METABOLIC PANEL - Abnormal; Notable for the following:       Result Value   CO2 17 (*)    BUN 33 (*)    Creatinine, Ser 1.61 (*)    Calcium 8.3 (*)    GFR calc non Af Amer 31 (*)    GFR calc Af Amer 36 (*)    All other components  within normal limits  CBC - Abnormal; Notable for the following:    WBC 16.1 (*)    RBC 2.93 (*)    Hemoglobin 8.1 (*)    HCT 24.2 (*)    RDW 15.8 (*)  Platelets 478 (*)    All other components within normal limits  URINALYSIS, ROUTINE W REFLEX MICROSCOPIC - Abnormal; Notable for the following:    Hgb urine dipstick SMALL (*)    Squamous Epithelial / LPF 0-5 (*)    All other components within normal limits  I-STAT TROPOININ, ED    EKG  EKG Interpretation  Date/Time:  Tuesday Mar 09 2017 12:24:23 EDT Ventricular Rate:  93 PR Interval:    QRS Duration: 85 QT Interval:  350 QTC Calculation: 436 R Axis:   16 Text Interpretation:  Sinus rhythm Probable left atrial enlargement No significant change since last tracing Confirmed by Theotis Burrow [phone]) on 03/09/2017 12:28:55 PM       Radiology Dg Chest 2 View  Result Date: 03/09/2017 CLINICAL DATA:  Pain extending from chest and neck. EXAM: CHEST  2 VIEW COMPARISON:  03/01/2017 FINDINGS: Normal heart size and mediastinal contours. No acute infiltrate or edema. No effusion or pneumothorax. No acute osseous findings. Osteopenia. IMPRESSION: No evidence of active disease. Electronically Signed   By: Monte Fantasia M.D.   On: 03/09/2017 12:01   Ct Cervical Spine Wo Contrast  Result Date: 03/09/2017 CLINICAL DATA:  Neck pain. EXAM: CT CERVICAL SPINE WITHOUT CONTRAST TECHNIQUE: Multidetector CT imaging of the cervical spine was performed without intravenous contrast. Multiplanar CT image reconstructions were also generated. COMPARISON:  Cervical spine MRI 12/16/2014 FINDINGS: Alignment: Cervical spine straightening. Unchanged trace anterolisthesis of C7 on T1 which appears facet mediated. Skull base and vertebrae: No acute fracture or destructive osseous process. Mild ligamentous thickening and calcification about the dens without evidence of significant spinal stenosis. Soft tissues and spinal canal: No evidence of prevertebral edema.  Right greater than left carotid bifurcation atherosclerosis. Disc levels: C2-3:  Minimal uncovertebral spurring without stenosis, unchanged. C3-4: Calcified central disc protrusion appears more prominent than on the prior MRI and results in mild spinal stenosis. Uncovertebral spurring results in at most mild right neural foraminal stenosis. C4-5: Disc bulging and annular calcification are stable to slightly increased from the prior MRI and result in mild spinal stenosis. Uncovertebral spurring results in mild right greater than left neural foraminal stenosis, likely unchanged. C5-6: Disc bulging, calcified right paracentral disc protrusion, and focal left ligamentum flavum thickening and mild calcification result in moderate to severe spinal stenosis, likely unchanged. No significant neural foraminal stenosis. C6-7: Left paracentral disc protrusion and ligamentum flavum thickening and ossification result in suspected severe spinal stenosis which has likely mildly increased. Uncovertebral spurring results in mild left neural foraminal stenosis. C7-T1: Listhesis with mild disc uncovering, ligamentum flavum thickening and calcification, and disc bulging and moderate facet arthrosis result in likely unchanged moderate spinal stenosis and mild right and moderate left neural foraminal stenosis. Upper chest: Unremarkable. Other: None. IMPRESSION: 1. No evidence of acute osseous abnormality. 2. Suspected mild progression of cervical spine degenerative change with possibly severe spinal stenosis at C6-7. Electronically Signed   By: Logan Bores M.D.   On: 03/09/2017 12:58    Procedures Procedures (including critical care time)  Medications Ordered in ED Medications  traMADol (ULTRAM) tablet 50 mg (50 mg Oral Given 03/09/17 1244)     Initial Impression / Assessment and Plan / ED Course  I have reviewed the triage vital signs and the nursing notes.  Pertinent labs & imaging results that were available during my  care of the patient were reviewed by me and considered in my medical decision making (see chart for details).  Final Clinical Impressions(s) / ED Diagnoses   Final diagnoses:  Neck pain  SOB (shortness of breath)  Hematoma    Labs:  UA, Trop, BMP, CBC - WBC 16.1, HGB 8.1,  Cr. 1.61  Imaging: DG Chest  2 view, Ct cervical   Consults:  Therapeutics: ultram   Discharge Meds:   Assessment/Plan: 47 YOF presents today with neck pain. Pain is radiating into upper chest, no signs consistent with ACS, PE. Pt given her daily dose of ultram which improved symptoms. No radiation of symptoms. Pt has reassuring workup here. No acute findings that would necessitate further eval or management in the ED. Pt also noted to have a bruising/hematoma  to left lower abd wall; likely secondary to sub q heparin injections. No surrounding redness, warmth or fluctuance, low suspicion for infection. Pt has follow up with PCP tomorrow; safe for discharge and close PCP follow up. Pt verbalized understanding and agreement to today's plan had no further questions or concerns at the time of discharge.      New Prescriptions Discharge Medication List as of 03/09/2017  3:47 PM       Okey Regal, PA-C 03/10/17 [place], Wenda Overland, MD 03/14/17 [phone]

## 2017-03-09 NOTE — ED Notes (Signed)
Patient transported to CT 

## 2017-03-09 NOTE — ED Notes (Signed)
Patient transported to X-ray 

## 2017-03-09 NOTE — ED Triage Notes (Signed)
Per EMS, pt from home, reports upper back pain.  Was just here a week ago, had an emergency dialysis.  Pt also reports SOB on exertion. Pt is A&OX 4.

## 2017-03-09 NOTE — ED Notes (Signed)
Left hand wrapped with gauze

## 2017-03-09 NOTE — Discharge Instructions (Signed)
Please read attached information. If you experience any new or worsening signs or symptoms please return to the emergency room for evaluation. Please follow-up with your primary care provider or specialist as discussed. Please use medication prescribed only as directed and discontinue taking if you have any concerning signs or symptoms.   °

## 2017-03-09 NOTE — ED Notes (Signed)
Family at bedside. 

## 2017-03-10 ENCOUNTER — Ambulatory Visit (INDEPENDENT_AMBULATORY_CARE_PROVIDER_SITE_OTHER): Payer: Medicare Other | Admitting: Internal Medicine

## 2017-03-10 VITALS — BP 151/69 | HR 79 | Temp 98.3°F | Wt 206.1 lb

## 2017-03-10 DIAGNOSIS — N179 Acute kidney failure, unspecified: Secondary | ICD-10-CM

## 2017-03-10 DIAGNOSIS — G894 Chronic pain syndrome: Secondary | ICD-10-CM | POA: Diagnosis not present

## 2017-03-10 MED ORDER — PREGABALIN 150 MG PO CAPS: 150.0000 mg | ORAL_CAPSULE | Freq: Every day | ORAL | 3 refills | Status: DC

## 2017-03-10 NOTE — Assessment & Plan Note (Addendum)
Patient with acute kidney injury and will require follow-up with nephrology. Briefly, patient recently admitted to the hospital for sepsis and shock which required intubation, mechanical ventilation and admission to ICU. Once in the ICU the patient went into acute renal failure and required CRRT. It appears her creatinine has been improving since this time. She is currently not on dialysis. Most recent creatinine of 1.61. It appears that her kidneys may make a full recovery following this acute renal failure. However, on chart review it appears that Dr. Justin Mend with nephrology would like to see the patient in the outpatient setting at least once. We'll place this referral. -- Referral to nephrology -- Continue to monitor

## 2017-03-10 NOTE — Progress Notes (Signed)
   CC: Emergency department follow-up visit HPI: [name] is a 71 y.o. female with a past medical history as listed below who presents as an emergency department follow-up.  Past Medical History:  Diagnosis Date  . Arthritis   . CAD 08/26/2006   Cath 07/05:multiple areas of nonobstructive disease = medical & RF mgmt, well preserved global systolic function. Dr. Lia Foyer. Nuclear med stress test 01/2014 : Normal stress nuclear study. LV Ejection Fraction: 76%. LV Wall Motion: NL LV Function; NL Wall Motion.      . Chronic pain syndrome 01/03/2013   Multifactorial. Non opioid requiring.   L2-5 fusion, with hardware at L2-3, stable per MRI 2010. Followed by neurosurgery (Dr. 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    [name] DEPRESSION 08/26/2006   On paxil    . DIABETES MELLITUS, TYPE II 08/26/2006   Insulin dependent. Victoza added 03/2014  . DYSLIPIDEMIA 11/01/2006   LDL goal < 100, 70 if can achieve without side effects.     . Gallstones   . GERD 08/26/2006   Heartburn QHS.     [name] HYPERTENSION 08/26/2006   On BB, ACEI, lasix, norvasc, metolazone, and imdur.     . OBESITY 08/26/2006   BMI 39.5. Obesity Class 2.     . PVD (peripheral vascular disease) ([name]) 03/27/2014   2015 LE arterial Duplex - Possible mild inflow disease on the left. 0-49% bilateral SFA disease, without focal stenosis. Three vesel run-off, bilaterally. Medical tx.      Review of Systems: Patient endorses neck and back pain. She also endorses abdominal pain and bruising. Physical Exam: Vitals:   03/10/17 1423  BP: (!) 151/69  Pulse: 79  Temp: 98.3 F (36.8 C)  TempSrc: Oral  SpO2: 100%  Weight: 206 lb 1.6 oz (93.5 kg)   BP (!) 151/69 (BP Location: Left Arm, Patient Position: Sitting, Cuff Size: Large)   Pulse 79   Temp 98.3 F (36.8 C) (Oral)   Wt 206 lb 1.6 oz (93.5 kg)   SpO2 100%   BMI 36.51 kg/m  General appearance: alert and  cooperative Head: Normocephalic, without obvious abnormality, atraumatic Lungs: clear to auscultation bilaterally Heart: regular rate and rhythm, S1, S2 normal, no murmur, click, rub or gallop Abdomen: Bowel sounds positive, large area of ecchymosis on her pannus bilaterally. Approximate 4 x 4 centimeter hematoma lateral to the umbilicus on the left. This was visualized with ultrasound. Extremities: Minimal bilateral lower extremity edema. Multiple areas of lesions on the patient's left hand where she had an IV in various stages of healing.  Assessment & Plan:  See encounters tab for problem based medical decision making. Patient seen with Dr. Angelia Mould  Signed: Ophelia Shoulder, MD 03/10/2017, 2:38 PM  Pager: [phone]

## 2017-03-10 NOTE — Assessment & Plan Note (Addendum)
Patient presents with ongoing chronic pain. Her pain complaints today are in the cervical spine, back and waist. She does not require opioid therapy. She is currently on Lyrica and Paxil. She also uses tramadol for severe pain. I discussed with her that we should go up on her Lyrica from 100 mg once daily to 150 mg daily. She endorsed understanding of this. I also told her to give this several weeks to take effect. If she does not notice any effect in the next 2-3 weeks to call the clinic and this medicine can be titrated up further. She endorsed understanding of this plan. She has scheduled follow-up with Dr. Lynnae January in June. -- Increase Lyrica from 100 mg to 150 mg daily, patient was instructed to call the clinic in approximately 2-3 weeks if she expresses no benefit from this medication and it can be further titrated up

## 2017-03-10 NOTE — Patient Instructions (Signed)
It was a pleasure seeing you today. Thank you for choosing [facility] for your healthcare needs.   I have increased your Lyrica from 100 mg once daily to 150 mg once daily. Please take this for 2 weeks and if you do not notice any improvement in your pain please call the clinic and we can go up on this dose again.  Please make sure you follow up with your appointment with Dr. Lynnae January. I will put your referral in for you to see the kidney doctor.  I suspect it will take at least another month for you to regain your strength and start feeling normal again.

## 2017-03-11 ENCOUNTER — Other Ambulatory Visit: Payer: Self-pay | Admitting: Sports Medicine

## 2017-03-11 DIAGNOSIS — M542 Cervicalgia: Secondary | ICD-10-CM

## 2017-03-15 NOTE — Progress Notes (Signed)
Internal Medicine Clinic Attending  Case discussed with Dr. Taylor at the time of the visit.  We reviewed the resident's history and exam and pertinent patient test results.  I agree with the assessment, diagnosis, and plan of care documented in the resident's note. 

## 2017-03-15 NOTE — Addendum Note (Signed)
Addended by: Joni Reining C on: 03/15/2017 08:51 AM   Modules accepted: Level of Service

## 2017-03-18 DIAGNOSIS — I130 Hypertensive heart and chronic kidney disease with heart failure and stage 1 through stage 4 chronic kidney disease, or unspecified chronic kidney disease: Secondary | ICD-10-CM | POA: Diagnosis not present

## 2017-03-18 DIAGNOSIS — I2510 Atherosclerotic heart disease of native coronary artery without angina pectoris: Secondary | ICD-10-CM | POA: Diagnosis not present

## 2017-03-18 DIAGNOSIS — T8089XD Other complications following infusion, transfusion and therapeutic injection, subsequent encounter: Secondary | ICD-10-CM | POA: Diagnosis not present

## 2017-03-18 DIAGNOSIS — J9601 Acute respiratory failure with hypoxia: Secondary | ICD-10-CM | POA: Diagnosis not present

## 2017-03-18 DIAGNOSIS — N189 Chronic kidney disease, unspecified: Secondary | ICD-10-CM | POA: Diagnosis not present

## 2017-03-18 DIAGNOSIS — E1140 Type 2 diabetes mellitus with diabetic neuropathy, unspecified: Secondary | ICD-10-CM | POA: Diagnosis not present

## 2017-03-18 DIAGNOSIS — I5032 Chronic diastolic (congestive) heart failure: Secondary | ICD-10-CM | POA: Diagnosis not present

## 2017-03-18 DIAGNOSIS — M4802 Spinal stenosis, cervical region: Secondary | ICD-10-CM | POA: Diagnosis not present

## 2017-03-18 DIAGNOSIS — N179 Acute kidney failure, unspecified: Secondary | ICD-10-CM | POA: Diagnosis not present

## 2017-03-18 DIAGNOSIS — M5136 Other intervertebral disc degeneration, lumbar region: Secondary | ICD-10-CM | POA: Diagnosis not present

## 2017-03-18 DIAGNOSIS — E785 Hyperlipidemia, unspecified: Secondary | ICD-10-CM | POA: Diagnosis not present

## 2017-03-18 DIAGNOSIS — E1122 Type 2 diabetes mellitus with diabetic chronic kidney disease: Secondary | ICD-10-CM | POA: Diagnosis not present

## 2017-03-18 DIAGNOSIS — D631 Anemia in chronic kidney disease: Secondary | ICD-10-CM | POA: Diagnosis not present

## 2017-03-19 ENCOUNTER — Encounter: Payer: Self-pay | Admitting: Cardiology

## 2017-03-19 DIAGNOSIS — J9601 Acute respiratory failure with hypoxia: Secondary | ICD-10-CM | POA: Diagnosis not present

## 2017-03-19 DIAGNOSIS — E1140 Type 2 diabetes mellitus with diabetic neuropathy, unspecified: Secondary | ICD-10-CM | POA: Diagnosis not present

## 2017-03-19 DIAGNOSIS — I5032 Chronic diastolic (congestive) heart failure: Secondary | ICD-10-CM | POA: Diagnosis not present

## 2017-03-19 DIAGNOSIS — E1122 Type 2 diabetes mellitus with diabetic chronic kidney disease: Secondary | ICD-10-CM | POA: Diagnosis not present

## 2017-03-19 DIAGNOSIS — T8089XD Other complications following infusion, transfusion and therapeutic injection, subsequent encounter: Secondary | ICD-10-CM | POA: Diagnosis not present

## 2017-03-19 DIAGNOSIS — I2510 Atherosclerotic heart disease of native coronary artery without angina pectoris: Secondary | ICD-10-CM | POA: Diagnosis not present

## 2017-03-19 DIAGNOSIS — N179 Acute kidney failure, unspecified: Secondary | ICD-10-CM | POA: Diagnosis not present

## 2017-03-19 DIAGNOSIS — N189 Chronic kidney disease, unspecified: Secondary | ICD-10-CM | POA: Diagnosis not present

## 2017-03-19 DIAGNOSIS — I130 Hypertensive heart and chronic kidney disease with heart failure and stage 1 through stage 4 chronic kidney disease, or unspecified chronic kidney disease: Secondary | ICD-10-CM | POA: Diagnosis not present

## 2017-03-19 DIAGNOSIS — E785 Hyperlipidemia, unspecified: Secondary | ICD-10-CM | POA: Diagnosis not present

## 2017-03-19 DIAGNOSIS — M4802 Spinal stenosis, cervical region: Secondary | ICD-10-CM | POA: Diagnosis not present

## 2017-03-19 DIAGNOSIS — D631 Anemia in chronic kidney disease: Secondary | ICD-10-CM | POA: Diagnosis not present

## 2017-03-19 DIAGNOSIS — M5136 Other intervertebral disc degeneration, lumbar region: Secondary | ICD-10-CM | POA: Diagnosis not present

## 2017-03-22 ENCOUNTER — Ambulatory Visit: Payer: Medicare Other | Admitting: Sports Medicine

## 2017-03-22 DIAGNOSIS — M5136 Other intervertebral disc degeneration, lumbar region: Secondary | ICD-10-CM | POA: Diagnosis not present

## 2017-03-22 DIAGNOSIS — I2510 Atherosclerotic heart disease of native coronary artery without angina pectoris: Secondary | ICD-10-CM | POA: Diagnosis not present

## 2017-03-22 DIAGNOSIS — E1122 Type 2 diabetes mellitus with diabetic chronic kidney disease: Secondary | ICD-10-CM | POA: Diagnosis not present

## 2017-03-22 DIAGNOSIS — N189 Chronic kidney disease, unspecified: Secondary | ICD-10-CM | POA: Diagnosis not present

## 2017-03-22 DIAGNOSIS — I5032 Chronic diastolic (congestive) heart failure: Secondary | ICD-10-CM | POA: Diagnosis not present

## 2017-03-22 DIAGNOSIS — I130 Hypertensive heart and chronic kidney disease with heart failure and stage 1 through stage 4 chronic kidney disease, or unspecified chronic kidney disease: Secondary | ICD-10-CM | POA: Diagnosis not present

## 2017-03-22 DIAGNOSIS — N179 Acute kidney failure, unspecified: Secondary | ICD-10-CM | POA: Diagnosis not present

## 2017-03-22 DIAGNOSIS — D631 Anemia in chronic kidney disease: Secondary | ICD-10-CM | POA: Diagnosis not present

## 2017-03-22 DIAGNOSIS — E1140 Type 2 diabetes mellitus with diabetic neuropathy, unspecified: Secondary | ICD-10-CM | POA: Diagnosis not present

## 2017-03-22 DIAGNOSIS — T8089XD Other complications following infusion, transfusion and therapeutic injection, subsequent encounter: Secondary | ICD-10-CM | POA: Diagnosis not present

## 2017-03-22 DIAGNOSIS — J9601 Acute respiratory failure with hypoxia: Secondary | ICD-10-CM | POA: Diagnosis not present

## 2017-03-22 DIAGNOSIS — E785 Hyperlipidemia, unspecified: Secondary | ICD-10-CM | POA: Diagnosis not present

## 2017-03-22 DIAGNOSIS — M4802 Spinal stenosis, cervical region: Secondary | ICD-10-CM | POA: Diagnosis not present

## 2017-03-23 ENCOUNTER — Telehealth: Payer: Self-pay

## 2017-03-23 DIAGNOSIS — I2510 Atherosclerotic heart disease of native coronary artery without angina pectoris: Secondary | ICD-10-CM | POA: Diagnosis not present

## 2017-03-23 DIAGNOSIS — E1140 Type 2 diabetes mellitus with diabetic neuropathy, unspecified: Secondary | ICD-10-CM | POA: Diagnosis not present

## 2017-03-23 DIAGNOSIS — D631 Anemia in chronic kidney disease: Secondary | ICD-10-CM | POA: Diagnosis not present

## 2017-03-23 DIAGNOSIS — E1122 Type 2 diabetes mellitus with diabetic chronic kidney disease: Secondary | ICD-10-CM | POA: Diagnosis not present

## 2017-03-23 DIAGNOSIS — M4802 Spinal stenosis, cervical region: Secondary | ICD-10-CM | POA: Diagnosis not present

## 2017-03-23 DIAGNOSIS — I5032 Chronic diastolic (congestive) heart failure: Secondary | ICD-10-CM | POA: Diagnosis not present

## 2017-03-23 DIAGNOSIS — I130 Hypertensive heart and chronic kidney disease with heart failure and stage 1 through stage 4 chronic kidney disease, or unspecified chronic kidney disease: Secondary | ICD-10-CM | POA: Diagnosis not present

## 2017-03-23 DIAGNOSIS — E785 Hyperlipidemia, unspecified: Secondary | ICD-10-CM | POA: Diagnosis not present

## 2017-03-23 DIAGNOSIS — T8089XD Other complications following infusion, transfusion and therapeutic injection, subsequent encounter: Secondary | ICD-10-CM | POA: Diagnosis not present

## 2017-03-23 DIAGNOSIS — J9601 Acute respiratory failure with hypoxia: Secondary | ICD-10-CM | POA: Diagnosis not present

## 2017-03-23 DIAGNOSIS — N179 Acute kidney failure, unspecified: Secondary | ICD-10-CM | POA: Diagnosis not present

## 2017-03-23 DIAGNOSIS — M5136 Other intervertebral disc degeneration, lumbar region: Secondary | ICD-10-CM | POA: Diagnosis not present

## 2017-03-23 DIAGNOSIS — N189 Chronic kidney disease, unspecified: Secondary | ICD-10-CM | POA: Diagnosis not present

## 2017-03-23 NOTE — Telephone Encounter (Signed)
[name] [name] with [facility] requesting VO, please call back.

## 2017-03-24 DIAGNOSIS — E1122 Type 2 diabetes mellitus with diabetic chronic kidney disease: Secondary | ICD-10-CM | POA: Diagnosis not present

## 2017-03-24 DIAGNOSIS — N179 Acute kidney failure, unspecified: Secondary | ICD-10-CM | POA: Diagnosis not present

## 2017-03-24 LAB — HEPATIC FUNCTION PANEL
ALT: 9 (ref 7–35)
AST: 16 (ref 13–35)
Alkaline Phosphatase: 87 (ref 25–125)

## 2017-03-24 LAB — CBC AND DIFFERENTIAL
Hemoglobin: 7.9 — AB (ref 12.0–16.0)
Platelets: 430 — AB (ref 150–399)

## 2017-03-24 LAB — BASIC METABOLIC PANEL
BUN: 11 (ref 4–21)
Creatinine: 1 (ref 0.5–1.1)
Glucose: 83
Potassium: 4.1 (ref 3.4–5.3)
Sodium: 141 (ref 137–147)

## 2017-03-24 NOTE — Telephone Encounter (Signed)
Lm for rtc 

## 2017-03-24 NOTE — Telephone Encounter (Signed)
Call from Randall Hiss, [place] at [place] at [facility] - requesting verbal order for "PT 2 times a week for 5 weeks". VO given - if not appropriate, let me know.

## 2017-03-25 DIAGNOSIS — N189 Chronic kidney disease, unspecified: Secondary | ICD-10-CM | POA: Diagnosis not present

## 2017-03-25 DIAGNOSIS — E1140 Type 2 diabetes mellitus with diabetic neuropathy, unspecified: Secondary | ICD-10-CM | POA: Diagnosis not present

## 2017-03-25 DIAGNOSIS — M4802 Spinal stenosis, cervical region: Secondary | ICD-10-CM | POA: Diagnosis not present

## 2017-03-25 DIAGNOSIS — E1122 Type 2 diabetes mellitus with diabetic chronic kidney disease: Secondary | ICD-10-CM | POA: Diagnosis not present

## 2017-03-25 DIAGNOSIS — I130 Hypertensive heart and chronic kidney disease with heart failure and stage 1 through stage 4 chronic kidney disease, or unspecified chronic kidney disease: Secondary | ICD-10-CM | POA: Diagnosis not present

## 2017-03-25 DIAGNOSIS — N179 Acute kidney failure, unspecified: Secondary | ICD-10-CM | POA: Diagnosis not present

## 2017-03-25 DIAGNOSIS — J9601 Acute respiratory failure with hypoxia: Secondary | ICD-10-CM | POA: Diagnosis not present

## 2017-03-25 DIAGNOSIS — I5032 Chronic diastolic (congestive) heart failure: Secondary | ICD-10-CM | POA: Diagnosis not present

## 2017-03-25 DIAGNOSIS — I2510 Atherosclerotic heart disease of native coronary artery without angina pectoris: Secondary | ICD-10-CM | POA: Diagnosis not present

## 2017-03-25 DIAGNOSIS — E785 Hyperlipidemia, unspecified: Secondary | ICD-10-CM | POA: Diagnosis not present

## 2017-03-25 DIAGNOSIS — M5136 Other intervertebral disc degeneration, lumbar region: Secondary | ICD-10-CM | POA: Diagnosis not present

## 2017-03-25 DIAGNOSIS — T8089XD Other complications following infusion, transfusion and therapeutic injection, subsequent encounter: Secondary | ICD-10-CM | POA: Diagnosis not present

## 2017-03-25 DIAGNOSIS — D631 Anemia in chronic kidney disease: Secondary | ICD-10-CM | POA: Diagnosis not present

## 2017-03-25 NOTE — Telephone Encounter (Signed)
VO OK with me

## 2017-03-26 DIAGNOSIS — D631 Anemia in chronic kidney disease: Secondary | ICD-10-CM | POA: Diagnosis not present

## 2017-03-26 DIAGNOSIS — N189 Chronic kidney disease, unspecified: Secondary | ICD-10-CM | POA: Diagnosis not present

## 2017-03-26 DIAGNOSIS — M5136 Other intervertebral disc degeneration, lumbar region: Secondary | ICD-10-CM | POA: Diagnosis not present

## 2017-03-26 DIAGNOSIS — I130 Hypertensive heart and chronic kidney disease with heart failure and stage 1 through stage 4 chronic kidney disease, or unspecified chronic kidney disease: Secondary | ICD-10-CM | POA: Diagnosis not present

## 2017-03-26 DIAGNOSIS — I2510 Atherosclerotic heart disease of native coronary artery without angina pectoris: Secondary | ICD-10-CM | POA: Diagnosis not present

## 2017-03-26 DIAGNOSIS — N179 Acute kidney failure, unspecified: Secondary | ICD-10-CM | POA: Diagnosis not present

## 2017-03-26 DIAGNOSIS — I5032 Chronic diastolic (congestive) heart failure: Secondary | ICD-10-CM | POA: Diagnosis not present

## 2017-03-26 DIAGNOSIS — E785 Hyperlipidemia, unspecified: Secondary | ICD-10-CM | POA: Diagnosis not present

## 2017-03-26 DIAGNOSIS — E1140 Type 2 diabetes mellitus with diabetic neuropathy, unspecified: Secondary | ICD-10-CM | POA: Diagnosis not present

## 2017-03-26 DIAGNOSIS — J9601 Acute respiratory failure with hypoxia: Secondary | ICD-10-CM | POA: Diagnosis not present

## 2017-03-26 DIAGNOSIS — T8089XD Other complications following infusion, transfusion and therapeutic injection, subsequent encounter: Secondary | ICD-10-CM | POA: Diagnosis not present

## 2017-03-26 DIAGNOSIS — M4802 Spinal stenosis, cervical region: Secondary | ICD-10-CM | POA: Diagnosis not present

## 2017-03-26 DIAGNOSIS — E1122 Type 2 diabetes mellitus with diabetic chronic kidney disease: Secondary | ICD-10-CM | POA: Diagnosis not present

## 2017-03-29 DIAGNOSIS — J9601 Acute respiratory failure with hypoxia: Secondary | ICD-10-CM | POA: Diagnosis not present

## 2017-03-29 DIAGNOSIS — M4802 Spinal stenosis, cervical region: Secondary | ICD-10-CM | POA: Diagnosis not present

## 2017-03-29 DIAGNOSIS — T8089XD Other complications following infusion, transfusion and therapeutic injection, subsequent encounter: Secondary | ICD-10-CM | POA: Diagnosis not present

## 2017-03-29 DIAGNOSIS — I5032 Chronic diastolic (congestive) heart failure: Secondary | ICD-10-CM | POA: Diagnosis not present

## 2017-03-29 DIAGNOSIS — D631 Anemia in chronic kidney disease: Secondary | ICD-10-CM | POA: Diagnosis not present

## 2017-03-29 DIAGNOSIS — E1140 Type 2 diabetes mellitus with diabetic neuropathy, unspecified: Secondary | ICD-10-CM | POA: Diagnosis not present

## 2017-03-29 DIAGNOSIS — N189 Chronic kidney disease, unspecified: Secondary | ICD-10-CM | POA: Diagnosis not present

## 2017-03-29 DIAGNOSIS — M5136 Other intervertebral disc degeneration, lumbar region: Secondary | ICD-10-CM | POA: Diagnosis not present

## 2017-03-29 DIAGNOSIS — I2510 Atherosclerotic heart disease of native coronary artery without angina pectoris: Secondary | ICD-10-CM | POA: Diagnosis not present

## 2017-03-29 DIAGNOSIS — I130 Hypertensive heart and chronic kidney disease with heart failure and stage 1 through stage 4 chronic kidney disease, or unspecified chronic kidney disease: Secondary | ICD-10-CM | POA: Diagnosis not present

## 2017-03-29 DIAGNOSIS — N179 Acute kidney failure, unspecified: Secondary | ICD-10-CM | POA: Diagnosis not present

## 2017-03-29 DIAGNOSIS — E1122 Type 2 diabetes mellitus with diabetic chronic kidney disease: Secondary | ICD-10-CM | POA: Diagnosis not present

## 2017-03-29 DIAGNOSIS — E785 Hyperlipidemia, unspecified: Secondary | ICD-10-CM | POA: Diagnosis not present

## 2017-03-30 ENCOUNTER — Encounter (HOSPITAL_BASED_OUTPATIENT_CLINIC_OR_DEPARTMENT_OTHER): Payer: Medicare Other | Attending: Surgery

## 2017-03-30 DIAGNOSIS — E11622 Type 2 diabetes mellitus with other skin ulcer: Secondary | ICD-10-CM | POA: Diagnosis not present

## 2017-03-30 DIAGNOSIS — L98499 Non-pressure chronic ulcer of skin of other sites with unspecified severity: Secondary | ICD-10-CM | POA: Insufficient documentation

## 2017-03-30 DIAGNOSIS — E669 Obesity, unspecified: Secondary | ICD-10-CM | POA: Diagnosis not present

## 2017-03-30 DIAGNOSIS — E1122 Type 2 diabetes mellitus with diabetic chronic kidney disease: Secondary | ICD-10-CM | POA: Insufficient documentation

## 2017-03-30 DIAGNOSIS — Z6837 Body mass index (BMI) 37.0-37.9, adult: Secondary | ICD-10-CM | POA: Insufficient documentation

## 2017-03-30 DIAGNOSIS — I129 Hypertensive chronic kidney disease with stage 1 through stage 4 chronic kidney disease, or unspecified chronic kidney disease: Secondary | ICD-10-CM | POA: Diagnosis not present

## 2017-03-30 DIAGNOSIS — N183 Chronic kidney disease, stage 3 (moderate): Secondary | ICD-10-CM | POA: Insufficient documentation

## 2017-03-30 DIAGNOSIS — S61402A Unspecified open wound of left hand, initial encounter: Secondary | ICD-10-CM | POA: Diagnosis not present

## 2017-03-30 DIAGNOSIS — I2510 Atherosclerotic heart disease of native coronary artery without angina pectoris: Secondary | ICD-10-CM | POA: Diagnosis not present

## 2017-03-30 DIAGNOSIS — E1140 Type 2 diabetes mellitus with diabetic neuropathy, unspecified: Secondary | ICD-10-CM | POA: Insufficient documentation

## 2017-03-31 DIAGNOSIS — M5136 Other intervertebral disc degeneration, lumbar region: Secondary | ICD-10-CM | POA: Diagnosis not present

## 2017-03-31 DIAGNOSIS — I5032 Chronic diastolic (congestive) heart failure: Secondary | ICD-10-CM | POA: Diagnosis not present

## 2017-03-31 DIAGNOSIS — D631 Anemia in chronic kidney disease: Secondary | ICD-10-CM | POA: Diagnosis not present

## 2017-03-31 DIAGNOSIS — N179 Acute kidney failure, unspecified: Secondary | ICD-10-CM | POA: Diagnosis not present

## 2017-03-31 DIAGNOSIS — J9601 Acute respiratory failure with hypoxia: Secondary | ICD-10-CM | POA: Diagnosis not present

## 2017-03-31 DIAGNOSIS — T8089XD Other complications following infusion, transfusion and therapeutic injection, subsequent encounter: Secondary | ICD-10-CM | POA: Diagnosis not present

## 2017-03-31 DIAGNOSIS — I2510 Atherosclerotic heart disease of native coronary artery without angina pectoris: Secondary | ICD-10-CM | POA: Diagnosis not present

## 2017-03-31 DIAGNOSIS — E1122 Type 2 diabetes mellitus with diabetic chronic kidney disease: Secondary | ICD-10-CM | POA: Diagnosis not present

## 2017-03-31 DIAGNOSIS — N189 Chronic kidney disease, unspecified: Secondary | ICD-10-CM | POA: Diagnosis not present

## 2017-03-31 DIAGNOSIS — I130 Hypertensive heart and chronic kidney disease with heart failure and stage 1 through stage 4 chronic kidney disease, or unspecified chronic kidney disease: Secondary | ICD-10-CM | POA: Diagnosis not present

## 2017-03-31 DIAGNOSIS — M4802 Spinal stenosis, cervical region: Secondary | ICD-10-CM | POA: Diagnosis not present

## 2017-03-31 DIAGNOSIS — E1140 Type 2 diabetes mellitus with diabetic neuropathy, unspecified: Secondary | ICD-10-CM | POA: Diagnosis not present

## 2017-03-31 DIAGNOSIS — E785 Hyperlipidemia, unspecified: Secondary | ICD-10-CM | POA: Diagnosis not present

## 2017-04-01 DIAGNOSIS — E785 Hyperlipidemia, unspecified: Secondary | ICD-10-CM | POA: Diagnosis not present

## 2017-04-01 DIAGNOSIS — J9601 Acute respiratory failure with hypoxia: Secondary | ICD-10-CM | POA: Diagnosis not present

## 2017-04-01 DIAGNOSIS — I130 Hypertensive heart and chronic kidney disease with heart failure and stage 1 through stage 4 chronic kidney disease, or unspecified chronic kidney disease: Secondary | ICD-10-CM | POA: Diagnosis not present

## 2017-04-01 DIAGNOSIS — E1122 Type 2 diabetes mellitus with diabetic chronic kidney disease: Secondary | ICD-10-CM | POA: Diagnosis not present

## 2017-04-01 DIAGNOSIS — D631 Anemia in chronic kidney disease: Secondary | ICD-10-CM | POA: Diagnosis not present

## 2017-04-01 DIAGNOSIS — M5136 Other intervertebral disc degeneration, lumbar region: Secondary | ICD-10-CM | POA: Diagnosis not present

## 2017-04-01 DIAGNOSIS — I5032 Chronic diastolic (congestive) heart failure: Secondary | ICD-10-CM | POA: Diagnosis not present

## 2017-04-01 DIAGNOSIS — M4802 Spinal stenosis, cervical region: Secondary | ICD-10-CM | POA: Diagnosis not present

## 2017-04-01 DIAGNOSIS — T8089XD Other complications following infusion, transfusion and therapeutic injection, subsequent encounter: Secondary | ICD-10-CM | POA: Diagnosis not present

## 2017-04-01 DIAGNOSIS — I2510 Atherosclerotic heart disease of native coronary artery without angina pectoris: Secondary | ICD-10-CM | POA: Diagnosis not present

## 2017-04-01 DIAGNOSIS — N179 Acute kidney failure, unspecified: Secondary | ICD-10-CM | POA: Diagnosis not present

## 2017-04-01 DIAGNOSIS — E1140 Type 2 diabetes mellitus with diabetic neuropathy, unspecified: Secondary | ICD-10-CM | POA: Diagnosis not present

## 2017-04-01 DIAGNOSIS — N189 Chronic kidney disease, unspecified: Secondary | ICD-10-CM | POA: Diagnosis not present

## 2017-04-02 DIAGNOSIS — D631 Anemia in chronic kidney disease: Secondary | ICD-10-CM | POA: Diagnosis not present

## 2017-04-05 DIAGNOSIS — D631 Anemia in chronic kidney disease: Secondary | ICD-10-CM | POA: Diagnosis not present

## 2017-04-05 DIAGNOSIS — N189 Chronic kidney disease, unspecified: Secondary | ICD-10-CM | POA: Diagnosis not present

## 2017-04-05 DIAGNOSIS — M5136 Other intervertebral disc degeneration, lumbar region: Secondary | ICD-10-CM | POA: Diagnosis not present

## 2017-04-05 DIAGNOSIS — I5032 Chronic diastolic (congestive) heart failure: Secondary | ICD-10-CM | POA: Diagnosis not present

## 2017-04-05 DIAGNOSIS — E1122 Type 2 diabetes mellitus with diabetic chronic kidney disease: Secondary | ICD-10-CM | POA: Diagnosis not present

## 2017-04-05 DIAGNOSIS — N179 Acute kidney failure, unspecified: Secondary | ICD-10-CM | POA: Diagnosis not present

## 2017-04-05 DIAGNOSIS — J9601 Acute respiratory failure with hypoxia: Secondary | ICD-10-CM | POA: Diagnosis not present

## 2017-04-05 DIAGNOSIS — E785 Hyperlipidemia, unspecified: Secondary | ICD-10-CM | POA: Diagnosis not present

## 2017-04-05 DIAGNOSIS — M4802 Spinal stenosis, cervical region: Secondary | ICD-10-CM | POA: Diagnosis not present

## 2017-04-05 DIAGNOSIS — E1140 Type 2 diabetes mellitus with diabetic neuropathy, unspecified: Secondary | ICD-10-CM | POA: Diagnosis not present

## 2017-04-05 DIAGNOSIS — T8089XD Other complications following infusion, transfusion and therapeutic injection, subsequent encounter: Secondary | ICD-10-CM | POA: Diagnosis not present

## 2017-04-05 DIAGNOSIS — I130 Hypertensive heart and chronic kidney disease with heart failure and stage 1 through stage 4 chronic kidney disease, or unspecified chronic kidney disease: Secondary | ICD-10-CM | POA: Diagnosis not present

## 2017-04-05 DIAGNOSIS — I2510 Atherosclerotic heart disease of native coronary artery without angina pectoris: Secondary | ICD-10-CM | POA: Diagnosis not present

## 2017-04-06 DIAGNOSIS — N183 Chronic kidney disease, stage 3 (moderate): Secondary | ICD-10-CM | POA: Diagnosis not present

## 2017-04-06 DIAGNOSIS — E11622 Type 2 diabetes mellitus with other skin ulcer: Secondary | ICD-10-CM | POA: Diagnosis not present

## 2017-04-06 DIAGNOSIS — E1140 Type 2 diabetes mellitus with diabetic neuropathy, unspecified: Secondary | ICD-10-CM | POA: Diagnosis not present

## 2017-04-06 DIAGNOSIS — L98499 Non-pressure chronic ulcer of skin of other sites with unspecified severity: Secondary | ICD-10-CM | POA: Diagnosis not present

## 2017-04-06 DIAGNOSIS — E1122 Type 2 diabetes mellitus with diabetic chronic kidney disease: Secondary | ICD-10-CM | POA: Diagnosis not present

## 2017-04-06 DIAGNOSIS — I129 Hypertensive chronic kidney disease with stage 1 through stage 4 chronic kidney disease, or unspecified chronic kidney disease: Secondary | ICD-10-CM | POA: Diagnosis not present

## 2017-04-06 DIAGNOSIS — I2510 Atherosclerotic heart disease of native coronary artery without angina pectoris: Secondary | ICD-10-CM | POA: Diagnosis not present

## 2017-04-07 DIAGNOSIS — M5136 Other intervertebral disc degeneration, lumbar region: Secondary | ICD-10-CM | POA: Diagnosis not present

## 2017-04-07 DIAGNOSIS — N189 Chronic kidney disease, unspecified: Secondary | ICD-10-CM | POA: Diagnosis not present

## 2017-04-07 DIAGNOSIS — I2510 Atherosclerotic heart disease of native coronary artery without angina pectoris: Secondary | ICD-10-CM | POA: Diagnosis not present

## 2017-04-07 DIAGNOSIS — E785 Hyperlipidemia, unspecified: Secondary | ICD-10-CM | POA: Diagnosis not present

## 2017-04-07 DIAGNOSIS — E1140 Type 2 diabetes mellitus with diabetic neuropathy, unspecified: Secondary | ICD-10-CM | POA: Diagnosis not present

## 2017-04-07 DIAGNOSIS — D631 Anemia in chronic kidney disease: Secondary | ICD-10-CM | POA: Diagnosis not present

## 2017-04-07 DIAGNOSIS — E1122 Type 2 diabetes mellitus with diabetic chronic kidney disease: Secondary | ICD-10-CM | POA: Diagnosis not present

## 2017-04-07 DIAGNOSIS — I5032 Chronic diastolic (congestive) heart failure: Secondary | ICD-10-CM | POA: Diagnosis not present

## 2017-04-07 DIAGNOSIS — T8089XD Other complications following infusion, transfusion and therapeutic injection, subsequent encounter: Secondary | ICD-10-CM | POA: Diagnosis not present

## 2017-04-07 DIAGNOSIS — M4802 Spinal stenosis, cervical region: Secondary | ICD-10-CM | POA: Diagnosis not present

## 2017-04-07 DIAGNOSIS — N179 Acute kidney failure, unspecified: Secondary | ICD-10-CM | POA: Diagnosis not present

## 2017-04-07 DIAGNOSIS — I130 Hypertensive heart and chronic kidney disease with heart failure and stage 1 through stage 4 chronic kidney disease, or unspecified chronic kidney disease: Secondary | ICD-10-CM | POA: Diagnosis not present

## 2017-04-07 DIAGNOSIS — J9601 Acute respiratory failure with hypoxia: Secondary | ICD-10-CM | POA: Diagnosis not present

## 2017-04-08 ENCOUNTER — Encounter: Payer: Self-pay | Admitting: Internal Medicine

## 2017-04-08 ENCOUNTER — Ambulatory Visit (INDEPENDENT_AMBULATORY_CARE_PROVIDER_SITE_OTHER): Payer: Medicare Other | Admitting: Internal Medicine

## 2017-04-08 ENCOUNTER — Ambulatory Visit: Payer: Medicare Other | Admitting: Cardiology

## 2017-04-08 VITALS — BP 158/64 | HR 72 | Temp 98.2°F | Wt 202.1 lb

## 2017-04-08 DIAGNOSIS — F5101 Primary insomnia: Secondary | ICD-10-CM

## 2017-04-08 DIAGNOSIS — E1142 Type 2 diabetes mellitus with diabetic polyneuropathy: Secondary | ICD-10-CM

## 2017-04-08 DIAGNOSIS — T8089XD Other complications following infusion, transfusion and therapeutic injection, subsequent encounter: Secondary | ICD-10-CM | POA: Diagnosis not present

## 2017-04-08 DIAGNOSIS — Z79899 Other long term (current) drug therapy: Secondary | ICD-10-CM

## 2017-04-08 DIAGNOSIS — E1151 Type 2 diabetes mellitus with diabetic peripheral angiopathy without gangrene: Secondary | ICD-10-CM | POA: Diagnosis not present

## 2017-04-08 DIAGNOSIS — E1140 Type 2 diabetes mellitus with diabetic neuropathy, unspecified: Secondary | ICD-10-CM | POA: Diagnosis not present

## 2017-04-08 DIAGNOSIS — M5136 Other intervertebral disc degeneration, lumbar region: Secondary | ICD-10-CM | POA: Diagnosis not present

## 2017-04-08 DIAGNOSIS — I130 Hypertensive heart and chronic kidney disease with heart failure and stage 1 through stage 4 chronic kidney disease, or unspecified chronic kidney disease: Secondary | ICD-10-CM

## 2017-04-08 DIAGNOSIS — J9601 Acute respiratory failure with hypoxia: Secondary | ICD-10-CM | POA: Diagnosis not present

## 2017-04-08 DIAGNOSIS — E1122 Type 2 diabetes mellitus with diabetic chronic kidney disease: Secondary | ICD-10-CM

## 2017-04-08 DIAGNOSIS — N183 Chronic kidney disease, stage 3 unspecified: Secondary | ICD-10-CM

## 2017-04-08 DIAGNOSIS — Z794 Long term (current) use of insulin: Secondary | ICD-10-CM | POA: Diagnosis not present

## 2017-04-08 DIAGNOSIS — G894 Chronic pain syndrome: Secondary | ICD-10-CM

## 2017-04-08 DIAGNOSIS — E1165 Type 2 diabetes mellitus with hyperglycemia: Secondary | ICD-10-CM | POA: Diagnosis not present

## 2017-04-08 DIAGNOSIS — R0989 Other specified symptoms and signs involving the circulatory and respiratory systems: Secondary | ICD-10-CM

## 2017-04-08 DIAGNOSIS — M4802 Spinal stenosis, cervical region: Secondary | ICD-10-CM | POA: Diagnosis not present

## 2017-04-08 DIAGNOSIS — Z7982 Long term (current) use of aspirin: Secondary | ICD-10-CM | POA: Diagnosis not present

## 2017-04-08 DIAGNOSIS — N179 Acute kidney failure, unspecified: Secondary | ICD-10-CM | POA: Diagnosis not present

## 2017-04-08 DIAGNOSIS — D631 Anemia in chronic kidney disease: Secondary | ICD-10-CM | POA: Diagnosis not present

## 2017-04-08 DIAGNOSIS — I10 Essential (primary) hypertension: Secondary | ICD-10-CM

## 2017-04-08 DIAGNOSIS — F325 Major depressive disorder, single episode, in full remission: Secondary | ICD-10-CM

## 2017-04-08 DIAGNOSIS — F329 Major depressive disorder, single episode, unspecified: Secondary | ICD-10-CM | POA: Diagnosis not present

## 2017-04-08 DIAGNOSIS — I2510 Atherosclerotic heart disease of native coronary artery without angina pectoris: Secondary | ICD-10-CM | POA: Diagnosis not present

## 2017-04-08 DIAGNOSIS — G4700 Insomnia, unspecified: Secondary | ICD-10-CM

## 2017-04-08 DIAGNOSIS — Z7722 Contact with and (suspected) exposure to environmental tobacco smoke (acute) (chronic): Secondary | ICD-10-CM

## 2017-04-08 DIAGNOSIS — M542 Cervicalgia: Secondary | ICD-10-CM

## 2017-04-08 DIAGNOSIS — E785 Hyperlipidemia, unspecified: Secondary | ICD-10-CM | POA: Diagnosis not present

## 2017-04-08 DIAGNOSIS — N189 Chronic kidney disease, unspecified: Secondary | ICD-10-CM | POA: Diagnosis not present

## 2017-04-08 DIAGNOSIS — IMO0002 Reserved for concepts with insufficient information to code with codable children: Secondary | ICD-10-CM

## 2017-04-08 DIAGNOSIS — I5032 Chronic diastolic (congestive) heart failure: Secondary | ICD-10-CM

## 2017-04-08 MED ORDER — ISOSORBIDE MONONITRATE ER 30 MG PO TB24: 30.0000 mg | ORAL_TABLET | Freq: Every day | ORAL | 3 refills | Status: DC

## 2017-04-08 MED ORDER — ATENOLOL 25 MG PO TABS: 25.0000 mg | ORAL_TABLET | Freq: Every day | ORAL | 3 refills | Status: DC

## 2017-04-08 MED ORDER — TRAMADOL HCL 50 MG PO TABS: 50.0000 mg | ORAL_TABLET | Freq: Two times a day (BID) | ORAL | 1 refills | Status: DC | PRN

## 2017-04-08 MED ORDER — INSULIN GLARGINE 100 UNIT/ML SOLOSTAR PEN: 10.0000 [IU] | PEN_INJECTOR | Freq: Every day | SUBCUTANEOUS | 2 refills | Status: DC

## 2017-04-08 MED ORDER — PAROXETINE HCL 40 MG PO TABS: 40.0000 mg | ORAL_TABLET | Freq: Every morning | ORAL | 3 refills | Status: DC

## 2017-04-08 MED ORDER — ATORVASTATIN CALCIUM 10 MG PO TABS: 10.0000 mg | ORAL_TABLET | Freq: Every day | ORAL | 3 refills | Status: DC

## 2017-04-08 MED ORDER — LIRAGLUTIDE 18 MG/3ML ~~LOC~~ SOPN: PEN_INJECTOR | SUBCUTANEOUS | 2 refills | Status: DC

## 2017-04-08 NOTE — Assessment & Plan Note (Addendum)
This problem is chronic and uncontrolled. Her Norvasc, Lasix, and Lotensin were held at discharge due to her acute kidney injury. I do not have her most recent creatinine that her creatinine had trended down to 1.6 which was on is her baseline. She has another appointment with nephrology July 17 and I will obtain their notes to see if when the medications may be added back.  PLAN : Continue atenolol 25 mg one half pill daily and Imdur 30

## 2017-04-08 NOTE — Patient Instructions (Signed)
1. I am thrilled you are doing better 2. I will look through your medications and refill anything needed  3. See me in August or September 4. Check your weight daily and let us know if you weight increases 5. Let us know if you have frequent low sugars 6 I filled out your scat form.

## 2017-04-08 NOTE — Assessment & Plan Note (Signed)
This problem is chronic and stable. Her A1c trend has been 6.6 - 7.0 - 6.7. She is on Lantus 10 units and victoza. She said checks her CBG 3 times a day before meals. The lowest was 72 but she did not have any hypoglycemia symptoms at that time. She had a couple that were in the 70-90 range. All of them were indicative of great control. She has an eye exam scheduled in the next month or 2. I am getting a microalbumin today and will send on to nephrology. Otherwise she is up-to-date on all of her diabetic metrics.  PLAN:  Cont current meds

## 2017-04-08 NOTE — Assessment & Plan Note (Signed)
This problem is chronic and uncontrolled. She lays down about 7:30 PM but doesn't fall asleep until 5 AM. She sleeps until 8 AM getting 3 hours of sleep. She takes a nap during the day without last 2 hours. She is fatigued during the day. I had been prescribing Ambien but she states she is currently not taking it. She is getting about 5 hours total that she is fatigued. I am hesitant to restart Ambien to to her age and comorbidities and did not refill it today. I will follow up with her sleep at her next appointment.  PLAN : Follow-up in 2 months

## 2017-04-08 NOTE — Assessment & Plan Note (Signed)
This problem is chronic and stable. She was taken off of her Lasix during her hospitalization for acute kidney injury. It has not yet been added back. She has no pulmonary edema today. Her weight is down from 232 in May to 202 today. I am not resuming the Lasix as she is euvolemic, I do not have her most recent creatinine, and she will be seeing nephrology.  PLAN:  Cont current meds Request renal's notes

## 2017-04-08 NOTE — Assessment & Plan Note (Signed)
This problem is chronic and stable. She required CVVH during her hospitalization but her creatinine trended down to 1.6 with a GFR of 36 on her most recent check. She saw nephrology in June and has a follow-up appointment July 17. She stated they took a lot of but did not make any medication changes. Without knowing her most recent creatinine, I am hesitant to make any medication changes to her blood pressure regimen. I am going to request nephrology's notes and she is returning to the clinic in 2 months.  PLAN : Request nephrology's notes

## 2017-04-08 NOTE — Assessment & Plan Note (Signed)
This problem is chronic and stable. Her Lyrica was dosed to discharge for her renal failure but she states today that she is not taking it. Despite this, her neuropathy is not too bad.  PLAN : cont to follow

## 2017-04-08 NOTE — Progress Notes (Signed)
   Subjective:    Patient ID: [name] [name], female    DOB: 1946-05-22, 71 y.o.   MRN: [id number]  HPI  [name] [name] is here for DM mgmt. Please see the A&P for the status of the pt's chronic medical problems.  ROS : per ROS section and in problem oriented charting. All other systems are negative.  PMHx, Soc hx, and / or Fam hx : She is here with her daughter. She was hospitalized May 16 through May 24 for acute encephalopathy etiology unknown with hypovolemic shock, acute renal failure requiring dialysis, and intubation. She was discharged to home and had a temporarily. Now she is independent in her ADLs with the exception of walking especially long distances around in the community. She continues to receive home physical therapy and home wound care to the left wrist where an IV infiltrated.   Review of Systems  Constitutional: Positive for fatigue.  HENT: Positive for rhinorrhea. Negative for sneezing.   Respiratory: Negative for shortness of breath.        + DOE  Cardiovascular: Negative for chest pain.  Gastrointestinal: Negative for diarrhea.  Musculoskeletal: Positive for back pain.  Psychiatric/Behavioral: Positive for sleep disturbance.       Objective:   Physical Exam  Constitutional: She appears well-developed and well-nourished. No distress.  HENT:  Head: Normocephalic and atraumatic.  Right Ear: External ear normal.  Left Ear: External ear normal.  Nose: Nose normal.  Eyes: Conjunctivae and EOM are normal.  Cardiovascular: Normal rate and regular rhythm.   Murmur heard. 2/6 systolic murmur at the right sternal border. There is a loud right carotid bruit  Pulmonary/Chest: Effort normal and breath sounds normal. No respiratory distress.  Musculoskeletal: She exhibits edema. She exhibits no tenderness.  Skin: Skin is warm and dry. She is not diaphoretic.  Psychiatric: She has a normal mood and affect. Her behavior is normal. Judgment and thought content normal.           Assessment & Plan:

## 2017-04-08 NOTE — Assessment & Plan Note (Signed)
This problem is chronic and stable. She continues to take her Paxil without any side effects. Her mood is okay.  PLAN:  Cont current meds

## 2017-04-08 NOTE — Assessment & Plan Note (Addendum)
This is an new incidental finding today on examination. She has not had carotid Dopplers, not had a stroke, not had a TIA. There is no agreed upon approach to an asymptomatic carotid bruit. Recommendations range from aggressive medical management to revascularization. Due to her recent illness, comorbidities, and lack of prior stroke I do not think that she is a candidate for revascularization. She is on Lipitor 10 mg and I can increase that at her next appointment if she is in agreement. She is Lipitor 20 mg at her cardiologist decreased to 10 mg in November 2017. She is on an aspirin as she has coronary artery disease and also takes a beta blocker. Her diabetes is well controlled. Her blood pressure is temporarily uncontrolled due to her acute kidney injury which is resolving if not resolved.  PLAN : Discuss increasing statin at her next appointment Continue aspirin Continue management of her comorbidities

## 2017-04-08 NOTE — Assessment & Plan Note (Addendum)
This problem is chronic and stable. She is no longer taking the Lyrica although was on her discharge list that the dosing has been adjusted for her renal failure. She is taking the tramadol every night. She does not take it during the day. Her neuropathy is not significant despite not taking the Lyrica. She left 60 tramadol March 29.  PLAN:  Cont current meds

## 2017-04-09 DIAGNOSIS — M5136 Other intervertebral disc degeneration, lumbar region: Secondary | ICD-10-CM | POA: Diagnosis not present

## 2017-04-09 DIAGNOSIS — E785 Hyperlipidemia, unspecified: Secondary | ICD-10-CM | POA: Diagnosis not present

## 2017-04-09 DIAGNOSIS — E1122 Type 2 diabetes mellitus with diabetic chronic kidney disease: Secondary | ICD-10-CM | POA: Diagnosis not present

## 2017-04-09 DIAGNOSIS — T8089XD Other complications following infusion, transfusion and therapeutic injection, subsequent encounter: Secondary | ICD-10-CM | POA: Diagnosis not present

## 2017-04-09 DIAGNOSIS — D631 Anemia in chronic kidney disease: Secondary | ICD-10-CM | POA: Diagnosis not present

## 2017-04-09 DIAGNOSIS — M4802 Spinal stenosis, cervical region: Secondary | ICD-10-CM | POA: Diagnosis not present

## 2017-04-09 DIAGNOSIS — I5032 Chronic diastolic (congestive) heart failure: Secondary | ICD-10-CM | POA: Diagnosis not present

## 2017-04-09 DIAGNOSIS — N189 Chronic kidney disease, unspecified: Secondary | ICD-10-CM | POA: Diagnosis not present

## 2017-04-09 DIAGNOSIS — J9601 Acute respiratory failure with hypoxia: Secondary | ICD-10-CM | POA: Diagnosis not present

## 2017-04-09 DIAGNOSIS — E1140 Type 2 diabetes mellitus with diabetic neuropathy, unspecified: Secondary | ICD-10-CM | POA: Diagnosis not present

## 2017-04-09 DIAGNOSIS — I130 Hypertensive heart and chronic kidney disease with heart failure and stage 1 through stage 4 chronic kidney disease, or unspecified chronic kidney disease: Secondary | ICD-10-CM | POA: Diagnosis not present

## 2017-04-09 DIAGNOSIS — I2510 Atherosclerotic heart disease of native coronary artery without angina pectoris: Secondary | ICD-10-CM | POA: Diagnosis not present

## 2017-04-09 DIAGNOSIS — N179 Acute kidney failure, unspecified: Secondary | ICD-10-CM | POA: Diagnosis not present

## 2017-04-09 LAB — MICROALBUMIN / CREATININE URINE RATIO
Creatinine, Urine: 125.4 mg/dL
Microalb/Creat Ratio: 17.7 mg/g creat (ref 0.0–30.0)
Microalbumin, Urine: 22.2 ug/mL

## 2017-04-10 ENCOUNTER — Encounter: Payer: Self-pay | Admitting: Internal Medicine

## 2017-04-12 ENCOUNTER — Ambulatory Visit: Payer: Medicare Other | Admitting: Sports Medicine

## 2017-04-12 DIAGNOSIS — E1140 Type 2 diabetes mellitus with diabetic neuropathy, unspecified: Secondary | ICD-10-CM | POA: Diagnosis not present

## 2017-04-12 DIAGNOSIS — M5136 Other intervertebral disc degeneration, lumbar region: Secondary | ICD-10-CM | POA: Diagnosis not present

## 2017-04-12 DIAGNOSIS — I5032 Chronic diastolic (congestive) heart failure: Secondary | ICD-10-CM | POA: Diagnosis not present

## 2017-04-12 DIAGNOSIS — M4802 Spinal stenosis, cervical region: Secondary | ICD-10-CM | POA: Diagnosis not present

## 2017-04-12 DIAGNOSIS — I130 Hypertensive heart and chronic kidney disease with heart failure and stage 1 through stage 4 chronic kidney disease, or unspecified chronic kidney disease: Secondary | ICD-10-CM | POA: Diagnosis not present

## 2017-04-12 DIAGNOSIS — T8089XD Other complications following infusion, transfusion and therapeutic injection, subsequent encounter: Secondary | ICD-10-CM | POA: Diagnosis not present

## 2017-04-12 DIAGNOSIS — E785 Hyperlipidemia, unspecified: Secondary | ICD-10-CM | POA: Diagnosis not present

## 2017-04-12 DIAGNOSIS — D631 Anemia in chronic kidney disease: Secondary | ICD-10-CM | POA: Diagnosis not present

## 2017-04-12 DIAGNOSIS — N189 Chronic kidney disease, unspecified: Secondary | ICD-10-CM | POA: Diagnosis not present

## 2017-04-12 DIAGNOSIS — I2510 Atherosclerotic heart disease of native coronary artery without angina pectoris: Secondary | ICD-10-CM | POA: Diagnosis not present

## 2017-04-12 DIAGNOSIS — N179 Acute kidney failure, unspecified: Secondary | ICD-10-CM | POA: Diagnosis not present

## 2017-04-12 DIAGNOSIS — E1122 Type 2 diabetes mellitus with diabetic chronic kidney disease: Secondary | ICD-10-CM | POA: Diagnosis not present

## 2017-04-12 DIAGNOSIS — J9601 Acute respiratory failure with hypoxia: Secondary | ICD-10-CM | POA: Diagnosis not present

## 2017-04-13 ENCOUNTER — Other Ambulatory Visit: Payer: Self-pay | Admitting: Internal Medicine

## 2017-04-13 ENCOUNTER — Encounter (HOSPITAL_BASED_OUTPATIENT_CLINIC_OR_DEPARTMENT_OTHER): Payer: Medicare Other | Attending: Surgery

## 2017-04-13 DIAGNOSIS — Z6837 Body mass index (BMI) 37.0-37.9, adult: Secondary | ICD-10-CM | POA: Insufficient documentation

## 2017-04-13 DIAGNOSIS — E11622 Type 2 diabetes mellitus with other skin ulcer: Secondary | ICD-10-CM | POA: Insufficient documentation

## 2017-04-13 DIAGNOSIS — E1151 Type 2 diabetes mellitus with diabetic peripheral angiopathy without gangrene: Secondary | ICD-10-CM | POA: Diagnosis not present

## 2017-04-13 DIAGNOSIS — I509 Heart failure, unspecified: Secondary | ICD-10-CM | POA: Diagnosis not present

## 2017-04-13 DIAGNOSIS — E1122 Type 2 diabetes mellitus with diabetic chronic kidney disease: Secondary | ICD-10-CM | POA: Insufficient documentation

## 2017-04-13 DIAGNOSIS — N183 Chronic kidney disease, stage 3 (moderate): Secondary | ICD-10-CM | POA: Diagnosis not present

## 2017-04-13 DIAGNOSIS — I130 Hypertensive heart and chronic kidney disease with heart failure and stage 1 through stage 4 chronic kidney disease, or unspecified chronic kidney disease: Secondary | ICD-10-CM | POA: Insufficient documentation

## 2017-04-13 DIAGNOSIS — L98492 Non-pressure chronic ulcer of skin of other sites with fat layer exposed: Secondary | ICD-10-CM | POA: Diagnosis not present

## 2017-04-13 DIAGNOSIS — L98499 Non-pressure chronic ulcer of skin of other sites with unspecified severity: Secondary | ICD-10-CM | POA: Diagnosis not present

## 2017-04-13 DIAGNOSIS — E1140 Type 2 diabetes mellitus with diabetic neuropathy, unspecified: Secondary | ICD-10-CM | POA: Diagnosis not present

## 2017-04-13 DIAGNOSIS — E669 Obesity, unspecified: Secondary | ICD-10-CM | POA: Insufficient documentation

## 2017-04-13 NOTE — Telephone Encounter (Signed)
Metformin was stopped after discharge due to renal failure. I have not restarted it because I am still waiting to get nephrology's labs to see wahat her most recent creatine was

## 2017-04-15 DIAGNOSIS — E1122 Type 2 diabetes mellitus with diabetic chronic kidney disease: Secondary | ICD-10-CM | POA: Diagnosis not present

## 2017-04-15 DIAGNOSIS — M4802 Spinal stenosis, cervical region: Secondary | ICD-10-CM | POA: Diagnosis not present

## 2017-04-15 DIAGNOSIS — J9601 Acute respiratory failure with hypoxia: Secondary | ICD-10-CM | POA: Diagnosis not present

## 2017-04-15 DIAGNOSIS — N189 Chronic kidney disease, unspecified: Secondary | ICD-10-CM | POA: Diagnosis not present

## 2017-04-15 DIAGNOSIS — M5136 Other intervertebral disc degeneration, lumbar region: Secondary | ICD-10-CM | POA: Diagnosis not present

## 2017-04-15 DIAGNOSIS — I5032 Chronic diastolic (congestive) heart failure: Secondary | ICD-10-CM | POA: Diagnosis not present

## 2017-04-15 DIAGNOSIS — E1140 Type 2 diabetes mellitus with diabetic neuropathy, unspecified: Secondary | ICD-10-CM | POA: Diagnosis not present

## 2017-04-15 DIAGNOSIS — I130 Hypertensive heart and chronic kidney disease with heart failure and stage 1 through stage 4 chronic kidney disease, or unspecified chronic kidney disease: Secondary | ICD-10-CM | POA: Diagnosis not present

## 2017-04-15 DIAGNOSIS — I2510 Atherosclerotic heart disease of native coronary artery without angina pectoris: Secondary | ICD-10-CM | POA: Diagnosis not present

## 2017-04-15 DIAGNOSIS — D631 Anemia in chronic kidney disease: Secondary | ICD-10-CM | POA: Diagnosis not present

## 2017-04-15 DIAGNOSIS — T8089XD Other complications following infusion, transfusion and therapeutic injection, subsequent encounter: Secondary | ICD-10-CM | POA: Diagnosis not present

## 2017-04-15 DIAGNOSIS — N179 Acute kidney failure, unspecified: Secondary | ICD-10-CM | POA: Diagnosis not present

## 2017-04-15 DIAGNOSIS — E785 Hyperlipidemia, unspecified: Secondary | ICD-10-CM | POA: Diagnosis not present

## 2017-04-19 DIAGNOSIS — D631 Anemia in chronic kidney disease: Secondary | ICD-10-CM | POA: Diagnosis not present

## 2017-04-19 DIAGNOSIS — I5032 Chronic diastolic (congestive) heart failure: Secondary | ICD-10-CM | POA: Diagnosis not present

## 2017-04-19 DIAGNOSIS — E1122 Type 2 diabetes mellitus with diabetic chronic kidney disease: Secondary | ICD-10-CM | POA: Diagnosis not present

## 2017-04-19 DIAGNOSIS — N179 Acute kidney failure, unspecified: Secondary | ICD-10-CM | POA: Diagnosis not present

## 2017-04-19 DIAGNOSIS — M5136 Other intervertebral disc degeneration, lumbar region: Secondary | ICD-10-CM | POA: Diagnosis not present

## 2017-04-19 DIAGNOSIS — J9601 Acute respiratory failure with hypoxia: Secondary | ICD-10-CM | POA: Diagnosis not present

## 2017-04-19 DIAGNOSIS — E785 Hyperlipidemia, unspecified: Secondary | ICD-10-CM | POA: Diagnosis not present

## 2017-04-19 DIAGNOSIS — M4802 Spinal stenosis, cervical region: Secondary | ICD-10-CM | POA: Diagnosis not present

## 2017-04-19 DIAGNOSIS — N189 Chronic kidney disease, unspecified: Secondary | ICD-10-CM | POA: Diagnosis not present

## 2017-04-19 DIAGNOSIS — I2510 Atherosclerotic heart disease of native coronary artery without angina pectoris: Secondary | ICD-10-CM | POA: Diagnosis not present

## 2017-04-19 DIAGNOSIS — T8089XD Other complications following infusion, transfusion and therapeutic injection, subsequent encounter: Secondary | ICD-10-CM | POA: Diagnosis not present

## 2017-04-19 DIAGNOSIS — E1140 Type 2 diabetes mellitus with diabetic neuropathy, unspecified: Secondary | ICD-10-CM | POA: Diagnosis not present

## 2017-04-19 DIAGNOSIS — I130 Hypertensive heart and chronic kidney disease with heart failure and stage 1 through stage 4 chronic kidney disease, or unspecified chronic kidney disease: Secondary | ICD-10-CM | POA: Diagnosis not present

## 2017-04-20 ENCOUNTER — Encounter: Payer: Self-pay | Admitting: Cardiology

## 2017-04-20 ENCOUNTER — Ambulatory Visit (INDEPENDENT_AMBULATORY_CARE_PROVIDER_SITE_OTHER): Payer: Medicare Other | Admitting: Cardiology

## 2017-04-20 VITALS — BP 168/76 | HR 86 | Ht 63.0 in | Wt 199.0 lb

## 2017-04-20 DIAGNOSIS — N183 Chronic kidney disease, stage 3 (moderate): Secondary | ICD-10-CM | POA: Diagnosis not present

## 2017-04-20 DIAGNOSIS — E785 Hyperlipidemia, unspecified: Secondary | ICD-10-CM

## 2017-04-20 DIAGNOSIS — I2510 Atherosclerotic heart disease of native coronary artery without angina pectoris: Secondary | ICD-10-CM

## 2017-04-20 DIAGNOSIS — E11622 Type 2 diabetes mellitus with other skin ulcer: Secondary | ICD-10-CM | POA: Diagnosis not present

## 2017-04-20 DIAGNOSIS — I509 Heart failure, unspecified: Secondary | ICD-10-CM | POA: Diagnosis not present

## 2017-04-20 DIAGNOSIS — E1122 Type 2 diabetes mellitus with diabetic chronic kidney disease: Secondary | ICD-10-CM | POA: Diagnosis not present

## 2017-04-20 DIAGNOSIS — I130 Hypertensive heart and chronic kidney disease with heart failure and stage 1 through stage 4 chronic kidney disease, or unspecified chronic kidney disease: Secondary | ICD-10-CM | POA: Diagnosis not present

## 2017-04-20 DIAGNOSIS — L98492 Non-pressure chronic ulcer of skin of other sites with fat layer exposed: Secondary | ICD-10-CM | POA: Diagnosis not present

## 2017-04-20 DIAGNOSIS — I10 Essential (primary) hypertension: Secondary | ICD-10-CM | POA: Diagnosis not present

## 2017-04-20 DIAGNOSIS — E1151 Type 2 diabetes mellitus with diabetic peripheral angiopathy without gangrene: Secondary | ICD-10-CM | POA: Diagnosis not present

## 2017-04-20 DIAGNOSIS — L98499 Non-pressure chronic ulcer of skin of other sites with unspecified severity: Secondary | ICD-10-CM | POA: Diagnosis not present

## 2017-04-20 DIAGNOSIS — E1140 Type 2 diabetes mellitus with diabetic neuropathy, unspecified: Secondary | ICD-10-CM | POA: Diagnosis not present

## 2017-04-20 MED ORDER — AMLODIPINE BESYLATE 5 MG PO TABS: 5.0000 mg | ORAL_TABLET | Freq: Every day | ORAL | 3 refills | Status: DC

## 2017-04-20 NOTE — Progress Notes (Signed)
04/20/2017 [name] [name]   [birth year]  [id number]  Primary Physician [name] Crews, MD Primary Cardiologist: Dr. Meda [name]    Reason for Visit/CC: 6 Month f/u for CAD, HTN and HLD   HPI:  [name] [name] is a 71 y.o. female who presents to clinic today for routine 6 month f/u for CAD. She is a former patient of Dr. Lia [name] and now followed by Dr. Meda [name]. Her last office visit was 08/2016. She has a h/o non osbstructive CAD, discovered by [facility] in 2005. She was found to have multiple areas of nonobstructive disease and medical management was elected. Her most recent ischemic evaluation was by nuclear stress testing in 2015, which was negative for ischemia. Most recent 2D echo 02/2017 showed normal LVEF at 65-70%. Mild DD, mild AI, mild MR, mild TR and mildly elevated pulmonary pressure was noted. Additional PMH includes, HTN, HLD DM and PVD>>2015 LE arterial Duplex - Possible mild inflow disease on the left. 0-49% bilateral SFA disease, without focal stenosis. Three vesel run-off, bilaterally. Medical tx.   Since her last OV, she was admitted 02/2017 to ICU for acute metabolic encephalopathy with hypovolemic shock and AKI. She required temporary intubation. She has recovered well since her discharge. Renal function improved but remained elevated>> Scr at 1.6 at time of discharge. Scr was as high as 4. Several of her antihypertensives, including her ACE and diuretic were discontinued. Since then, she reports that her BP has been running on the higher side.   Her BP in clinic today is elevated at 166/76. She reports full med compliance an took all of her meds this morning. She is on atenolol and Imdur. No listed allergies. She denies CP and dyspnea. No LEE, orthopnea or PND. She also denies palpitations, dizziness, syncope/ near syncope. She is overdue for FLP, however she is not fasting today.      Current Meds  Medication Sig  . Acetaminophen 650 MG TABS Take 650 mg by mouth 3 (three) times  daily.  [name] aspirin 81 MG tablet Take 1 tablet (81 mg total) by mouth daily.  . ASSURE COMFORT LANCETS 30G MISC E11.59. Use to check blood sugar three times a day  . atenolol (TENORMIN) 25 MG tablet Take 1 tablet (25 mg total) by mouth daily.  [name] atorvastatin (LIPITOR) 10 MG tablet Take 1 tablet (10 mg total) by mouth daily.  . calcium citrate-vitamin D 500-400 MG-UNIT chewable tablet Chew 1 tablet by mouth daily.  [name] glucose blood (ACCU-CHEK AVIVA PLUS) test strip E11.59. Use to check blood sugar three times a day  . Insulin Glargine (LANTUS SOLOSTAR) 100 UNIT/ML Solostar Pen Inject 10 Units into the skin daily at 10 pm.  . isosorbide mononitrate (IMDUR) 30 MG 24 hr tablet Take 1 tablet (30 mg total) by mouth daily.  [name] lidocaine (XYLOCAINE) 5 % ointment Apply 2 grams up to 4 times daily to affected area  . liraglutide (VICTOZA) 18 MG/3ML SOPN inject 1.2 milligram subcutaneously daily  . loratadine (CLARITIN) 10 MG tablet take 1 tablet by mouth once daily  . magnesium chloride (SLOW-MAG) 64 MG TBEC SR tablet Take 1 tablet (64 mg total) by mouth daily.  . nitroGLYCERIN (NITROSTAT) 0.4 MG SL tablet Place 1 tablet (0.4 mg total) under the tongue every 5 (five) minutes as needed for chest pain.  [name] nystatin (NYSTATIN) powder Apply topically 4 (four) times daily.  [name] PARoxetine (PAXIL) 40 MG tablet Take 1 tablet (40 mg total) by mouth every morning.  [name]  ranitidine (ZANTAC) 150 MG tablet Take 1 tablet (150 mg total) by mouth daily.  . traMADol (ULTRAM) 50 MG tablet Take 1 tablet (50 mg total) by mouth every 12 (twelve) hours as needed.   No Known Allergies Past Medical History:  Diagnosis Date  . Arthritis   . CAD 08/26/2006   Cath 07/05:multiple areas of nonobstructive disease = medical & RF mgmt, well preserved global systolic function. Dr. Lia [name]. Nuclear med stress test 01/2014 : Normal stress nuclear study. LV Ejection Fraction: 76%. LV Wall Motion: NL LV Function; NL Wall Motion.      . Chronic  pain syndrome 01/03/2013   Multifactorial. Non opioid requiring.   L2-5 fusion, with hardware at L2-3, stable per MRI 2010. Followed by neurosurgery (Dr. Ronnald [name]) Cervical MRI 2010 : Disc herniation at C6-7 L>R; possible compression/irritation C8 nerve root L>R.    [name] DEPRESSION 08/26/2006   On paxil    . DIABETES MELLITUS, TYPE II 08/26/2006   Insulin dependent. Victoza added 03/2014  . DYSLIPIDEMIA 11/01/2006   LDL goal < 100, 70 if can achieve without side effects.     . Gallstones   . GERD 08/26/2006   Heartburn QHS.     [name] HYPERTENSION 08/26/2006   On BB, ACEI, lasix, norvasc, metolazone, and imdur.     . OBESITY 08/26/2006   BMI 39.5. Obesity Class 2.     . PVD (peripheral vascular disease) ([place]) 03/27/2014   2015 LE arterial Duplex - Possible mild inflow disease on the left. 0-49% bilateral SFA disease, without focal stenosis. Three vesel run-off, bilaterally. Medical tx.    Family History  Problem Relation Age of Onset  . Cirrhosis Mother   . Diabetes Father   . Hypertension Father   . Hypertension Sister   . Diabetes Sister   . Hyperlipidemia Sister    Past Surgical History:  Procedure Laterality Date  . CHOLECYSTECTOMY     pt denies 04/07/16  . endometrial biospy    . LEFT HEART CATH    . lumbar fusion surgery  10/08   L3-L5  . posterior lumbar interfusion surgery  07/18/07   L2-L3  . rotator cuff surgery Bilateral    Social History   Social History  . Marital status: Single    Spouse name: N/A  . Number of children: 2  . Years of education: N/A   Occupational History  . Not on file.   Social History Main Topics  . Smoking status: Passive Smoke Exposure - Never Smoker  . Smokeless tobacco: Never Used  . Alcohol use No  . Drug use: No  . Sexual activity: Not on file   Other Topics Concern  . 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 Has two daughters and several grand kids. Severe limitation in ambulation. Occ  goes out to church but usually daughter gets groceries.    She is one of 7 kids and the oldest so helped raise the other 6.     Review of Systems: General: negative for chills, fever, night sweats or weight changes.  Cardiovascular: negative for chest pain, dyspnea on exertion, edema, orthopnea, palpitations, paroxysmal nocturnal dyspnea or shortness of breath Dermatological: negative for rash Respiratory: negative for cough or wheezing Urologic: negative for hematuria Abdominal: negative for nausea, vomiting, diarrhea, bright red blood per rectum, melena, or hematemesis Neurologic: negative for visual changes, syncope, or dizziness All other systems reviewed and are otherwise negative except as  noted above.   Physical Exam:  Blood pressure (!) 168/76, pulse 86, height 5\' 3"  (1.6 m), weight 199 lb (90.3 kg).  General appearance: alert, cooperative and no distress Neck: no carotid bruit and no JVD Lungs: clear to auscultation bilaterally Heart: regular rate and rhythm, S1, S2 normal, no murmur, click, rub or gallop Extremities: extremities normal, atraumatic, no cyanosis or edema Pulses: 2+ and symmetric Skin: Skin color, texture, turgor normal. No rashes or lesions Neurologic: Grossly normal  EKG not preformed  -- personally reviewed   ASSESSMENT AND PLAN:   1. CAD: h/o non osbstructive CAD, discovered by [facility] in 2005. She was found to have multiple areas of nonobstructive disease and medical management was elected. Her most recent ischemic evaluation was by nuclear stress testing in 2015, which was negative for ischemia. Most recent 2D echo 02/2017 showed normal LVEF at 65-70%. She denies CP and dyspnea. Continue ASA, statin, BB and LA nitrate.   2. HTN: poorly controlled and pt reports her BP has been elevated since her discharge from the hospital 02/2017 after her ACE and diuretic were discontinued due to AKI/CKD. BP is 166/76. We will add amlodipine 5 mg. Continue Imdur and atenolol.  Avoid salt. F/u in HTN clinic in 2-3 weeks.   3. HLD: on statin therapy with Lipitor. She is overdue for lipid panel but is not fasting today. She will return for FLP and HFTs when she reports to HTN clinic in 2 weeks for f/u for BP.   4. DM: on insulin. Followed by PCP.   5. PVD: 2015 LE arterial Duplex - Possible mild inflow disease on the left. 0-49% bilateral SFA disease, without focal stenosis. Three vesel run-off, bilaterally. Medical tx. Denies claudication. Continue ASA and statin.   6. Chronic Diastolic Dysfunction: most recent 2D echo 02/2017 showed normal LVEF and mild DD. Euvolemic on physical exam. No dyspnea. Needs better BP controll>> adding amlodipine. Continue BB. Avoid salt.   7. Valvular Disease: mild. Most recent 2D echo 02/2017 showed mild AL, mild MR and mild TR.   Follow-Up>> HTN clinic in 2 weeks for reassessment of BP and response to amlodipine. F/u with Dr. Meda [name] in 6 months.   [name] [name], MHS [facility] 04/20/2017 10:35 AM

## 2017-04-20 NOTE — Patient Instructions (Signed)
Medication Instructions:  Your physician has recommended you make the following change in your medication: 1.) START Amlodipine 5 mg daily.  Labwork: Your physician recommends that you return for lab work in 2 weeks for Fasting Lipid Panel, Hepatic function panel  Testing/Procedures: None Ordered   Follow-Up: Your physician wants you to follow-up in: 6 months with Dr. Johann Capers will receive a reminder letter in the mail two months in advance. If you don't receive a letter, please call our office to schedule the follow-up appointment.   Any Other Special Instructions Will Be Listed Below (If Applicable).  Please make an appointment for the hypertension clinic with the pharmacy in 2 weeks. You can do this on the morning of your fasting blood work.    If you need a refill on your cardiac medications before your next appointment, please call your pharmacy.

## 2017-04-21 DIAGNOSIS — N189 Chronic kidney disease, unspecified: Secondary | ICD-10-CM | POA: Diagnosis not present

## 2017-04-21 DIAGNOSIS — T8089XD Other complications following infusion, transfusion and therapeutic injection, subsequent encounter: Secondary | ICD-10-CM | POA: Diagnosis not present

## 2017-04-21 DIAGNOSIS — N179 Acute kidney failure, unspecified: Secondary | ICD-10-CM | POA: Diagnosis not present

## 2017-04-21 DIAGNOSIS — I5032 Chronic diastolic (congestive) heart failure: Secondary | ICD-10-CM | POA: Diagnosis not present

## 2017-04-21 DIAGNOSIS — M4802 Spinal stenosis, cervical region: Secondary | ICD-10-CM | POA: Diagnosis not present

## 2017-04-21 DIAGNOSIS — I2510 Atherosclerotic heart disease of native coronary artery without angina pectoris: Secondary | ICD-10-CM | POA: Diagnosis not present

## 2017-04-21 DIAGNOSIS — J9601 Acute respiratory failure with hypoxia: Secondary | ICD-10-CM | POA: Diagnosis not present

## 2017-04-21 DIAGNOSIS — D631 Anemia in chronic kidney disease: Secondary | ICD-10-CM | POA: Diagnosis not present

## 2017-04-21 DIAGNOSIS — E1122 Type 2 diabetes mellitus with diabetic chronic kidney disease: Secondary | ICD-10-CM | POA: Diagnosis not present

## 2017-04-21 DIAGNOSIS — I130 Hypertensive heart and chronic kidney disease with heart failure and stage 1 through stage 4 chronic kidney disease, or unspecified chronic kidney disease: Secondary | ICD-10-CM | POA: Diagnosis not present

## 2017-04-21 DIAGNOSIS — M5136 Other intervertebral disc degeneration, lumbar region: Secondary | ICD-10-CM | POA: Diagnosis not present

## 2017-04-21 DIAGNOSIS — E1140 Type 2 diabetes mellitus with diabetic neuropathy, unspecified: Secondary | ICD-10-CM | POA: Diagnosis not present

## 2017-04-21 DIAGNOSIS — E785 Hyperlipidemia, unspecified: Secondary | ICD-10-CM | POA: Diagnosis not present

## 2017-04-22 ENCOUNTER — Ambulatory Visit (INDEPENDENT_AMBULATORY_CARE_PROVIDER_SITE_OTHER): Payer: Medicare Other | Admitting: Sports Medicine

## 2017-04-22 VITALS — BP 150/70 | Ht <= 58 in | Wt 232.0 lb

## 2017-04-22 DIAGNOSIS — I2510 Atherosclerotic heart disease of native coronary artery without angina pectoris: Secondary | ICD-10-CM | POA: Diagnosis not present

## 2017-04-22 DIAGNOSIS — M4802 Spinal stenosis, cervical region: Secondary | ICD-10-CM | POA: Diagnosis not present

## 2017-04-22 DIAGNOSIS — D631 Anemia in chronic kidney disease: Secondary | ICD-10-CM | POA: Diagnosis not present

## 2017-04-22 DIAGNOSIS — T8089XD Other complications following infusion, transfusion and therapeutic injection, subsequent encounter: Secondary | ICD-10-CM | POA: Diagnosis not present

## 2017-04-22 DIAGNOSIS — I130 Hypertensive heart and chronic kidney disease with heart failure and stage 1 through stage 4 chronic kidney disease, or unspecified chronic kidney disease: Secondary | ICD-10-CM | POA: Diagnosis not present

## 2017-04-22 DIAGNOSIS — M5136 Other intervertebral disc degeneration, lumbar region: Secondary | ICD-10-CM | POA: Diagnosis not present

## 2017-04-22 DIAGNOSIS — E785 Hyperlipidemia, unspecified: Secondary | ICD-10-CM | POA: Diagnosis not present

## 2017-04-22 DIAGNOSIS — J9601 Acute respiratory failure with hypoxia: Secondary | ICD-10-CM | POA: Diagnosis not present

## 2017-04-22 DIAGNOSIS — E1140 Type 2 diabetes mellitus with diabetic neuropathy, unspecified: Secondary | ICD-10-CM | POA: Diagnosis not present

## 2017-04-22 DIAGNOSIS — E1122 Type 2 diabetes mellitus with diabetic chronic kidney disease: Secondary | ICD-10-CM | POA: Diagnosis not present

## 2017-04-22 DIAGNOSIS — I5032 Chronic diastolic (congestive) heart failure: Secondary | ICD-10-CM | POA: Diagnosis not present

## 2017-04-22 DIAGNOSIS — N189 Chronic kidney disease, unspecified: Secondary | ICD-10-CM | POA: Diagnosis not present

## 2017-04-22 DIAGNOSIS — N179 Acute kidney failure, unspecified: Secondary | ICD-10-CM | POA: Diagnosis not present

## 2017-04-22 MED ORDER — TRAMADOL HCL 50 MG PO TABS: 50.0000 mg | ORAL_TABLET | Freq: Two times a day (BID) | ORAL | 1 refills | Status: DC

## 2017-04-23 NOTE — Progress Notes (Signed)
   Subjective:    Patient ID: [name] [name], female    DOB: Apr 13, 1946, 71 y.o.   MRN: [id number]  HPI  Patient comes in today with returning neck and shoulder pain. Since her last office visit with me she has been hospitalized with acute renal failure. She was in the ICU for period of time. Her nephrologist is Dr.Webb and she has an appointment with him next week. Prior to her hospitalization we had scheduled a cervical ESI. That was obviously canceled when she got sick. She would like to go ahead with this procedure.     Review of Systems As above     Objective:   Physical Exam  Well-developed, well-nourished. No acute distress. Sitting comfortably in exam room. Vital signs reviewed   Cervical spine: Limited cervical range of motion. No tenderness to palpation along cervical midline. Diffuse tenderness along the paraspinal musculature bilaterally.  Neurological exam shows good strength in both upper extremities. No atrophy. Brisk and equal reflexes at the biceps, triceps, and brachial radialis tendons. Sensation is intact to light touch grossly.      Assessment & Plan:  Neck and shoulder pain secondary to cervical degenerative disc disease and spinal stenosis Recent acute renal failure requiring hospital admission  Before reordering the cervical ESI, I will check with Dr. Justin Mend to make sure that this is safe for her. At some point she may need to see Dr. Ronnald Ramp (neurosurgeon who has seen her previously for her low back). If Dr. Justin Mend approves, I will reschedule her cervical ESI (at the time of this dictation I was waiting to hear back from his office).

## 2017-04-26 DIAGNOSIS — I130 Hypertensive heart and chronic kidney disease with heart failure and stage 1 through stage 4 chronic kidney disease, or unspecified chronic kidney disease: Secondary | ICD-10-CM | POA: Diagnosis not present

## 2017-04-26 DIAGNOSIS — N189 Chronic kidney disease, unspecified: Secondary | ICD-10-CM | POA: Diagnosis not present

## 2017-04-26 DIAGNOSIS — D631 Anemia in chronic kidney disease: Secondary | ICD-10-CM | POA: Diagnosis not present

## 2017-04-26 DIAGNOSIS — E1140 Type 2 diabetes mellitus with diabetic neuropathy, unspecified: Secondary | ICD-10-CM | POA: Diagnosis not present

## 2017-04-26 DIAGNOSIS — E1122 Type 2 diabetes mellitus with diabetic chronic kidney disease: Secondary | ICD-10-CM | POA: Diagnosis not present

## 2017-04-26 DIAGNOSIS — J9601 Acute respiratory failure with hypoxia: Secondary | ICD-10-CM | POA: Diagnosis not present

## 2017-04-26 DIAGNOSIS — M5136 Other intervertebral disc degeneration, lumbar region: Secondary | ICD-10-CM | POA: Diagnosis not present

## 2017-04-26 DIAGNOSIS — M4802 Spinal stenosis, cervical region: Secondary | ICD-10-CM | POA: Diagnosis not present

## 2017-04-26 DIAGNOSIS — E785 Hyperlipidemia, unspecified: Secondary | ICD-10-CM | POA: Diagnosis not present

## 2017-04-26 DIAGNOSIS — T8089XD Other complications following infusion, transfusion and therapeutic injection, subsequent encounter: Secondary | ICD-10-CM | POA: Diagnosis not present

## 2017-04-26 DIAGNOSIS — I5032 Chronic diastolic (congestive) heart failure: Secondary | ICD-10-CM | POA: Diagnosis not present

## 2017-04-26 DIAGNOSIS — I2510 Atherosclerotic heart disease of native coronary artery without angina pectoris: Secondary | ICD-10-CM | POA: Diagnosis not present

## 2017-04-26 DIAGNOSIS — N179 Acute kidney failure, unspecified: Secondary | ICD-10-CM | POA: Diagnosis not present

## 2017-04-27 DIAGNOSIS — N179 Acute kidney failure, unspecified: Secondary | ICD-10-CM | POA: Diagnosis not present

## 2017-04-27 DIAGNOSIS — E1122 Type 2 diabetes mellitus with diabetic chronic kidney disease: Secondary | ICD-10-CM | POA: Diagnosis not present

## 2017-04-28 ENCOUNTER — Other Ambulatory Visit: Payer: Self-pay

## 2017-04-28 DIAGNOSIS — L98492 Non-pressure chronic ulcer of skin of other sites with fat layer exposed: Secondary | ICD-10-CM | POA: Diagnosis not present

## 2017-04-28 DIAGNOSIS — L98499 Non-pressure chronic ulcer of skin of other sites with unspecified severity: Secondary | ICD-10-CM | POA: Diagnosis not present

## 2017-04-28 DIAGNOSIS — E1122 Type 2 diabetes mellitus with diabetic chronic kidney disease: Secondary | ICD-10-CM | POA: Diagnosis not present

## 2017-04-28 DIAGNOSIS — E1151 Type 2 diabetes mellitus with diabetic peripheral angiopathy without gangrene: Secondary | ICD-10-CM | POA: Diagnosis not present

## 2017-04-28 DIAGNOSIS — I509 Heart failure, unspecified: Secondary | ICD-10-CM | POA: Diagnosis not present

## 2017-04-28 DIAGNOSIS — E11622 Type 2 diabetes mellitus with other skin ulcer: Secondary | ICD-10-CM | POA: Diagnosis not present

## 2017-04-28 DIAGNOSIS — N183 Chronic kidney disease, stage 3 (moderate): Secondary | ICD-10-CM | POA: Diagnosis not present

## 2017-04-28 DIAGNOSIS — E1140 Type 2 diabetes mellitus with diabetic neuropathy, unspecified: Secondary | ICD-10-CM | POA: Diagnosis not present

## 2017-04-28 DIAGNOSIS — I130 Hypertensive heart and chronic kidney disease with heart failure and stage 1 through stage 4 chronic kidney disease, or unspecified chronic kidney disease: Secondary | ICD-10-CM | POA: Diagnosis not present

## 2017-04-28 DIAGNOSIS — M4802 Spinal stenosis, cervical region: Secondary | ICD-10-CM

## 2017-04-29 DIAGNOSIS — M4802 Spinal stenosis, cervical region: Secondary | ICD-10-CM | POA: Diagnosis not present

## 2017-04-29 DIAGNOSIS — E785 Hyperlipidemia, unspecified: Secondary | ICD-10-CM | POA: Diagnosis not present

## 2017-04-29 DIAGNOSIS — N179 Acute kidney failure, unspecified: Secondary | ICD-10-CM | POA: Diagnosis not present

## 2017-04-29 DIAGNOSIS — J9601 Acute respiratory failure with hypoxia: Secondary | ICD-10-CM | POA: Diagnosis not present

## 2017-04-29 DIAGNOSIS — M5136 Other intervertebral disc degeneration, lumbar region: Secondary | ICD-10-CM | POA: Diagnosis not present

## 2017-04-29 DIAGNOSIS — D631 Anemia in chronic kidney disease: Secondary | ICD-10-CM | POA: Diagnosis not present

## 2017-04-29 DIAGNOSIS — N189 Chronic kidney disease, unspecified: Secondary | ICD-10-CM | POA: Diagnosis not present

## 2017-04-29 DIAGNOSIS — I2510 Atherosclerotic heart disease of native coronary artery without angina pectoris: Secondary | ICD-10-CM | POA: Diagnosis not present

## 2017-04-29 DIAGNOSIS — E1140 Type 2 diabetes mellitus with diabetic neuropathy, unspecified: Secondary | ICD-10-CM | POA: Diagnosis not present

## 2017-04-29 DIAGNOSIS — E1122 Type 2 diabetes mellitus with diabetic chronic kidney disease: Secondary | ICD-10-CM | POA: Diagnosis not present

## 2017-04-29 DIAGNOSIS — I5032 Chronic diastolic (congestive) heart failure: Secondary | ICD-10-CM | POA: Diagnosis not present

## 2017-04-29 DIAGNOSIS — T8089XD Other complications following infusion, transfusion and therapeutic injection, subsequent encounter: Secondary | ICD-10-CM | POA: Diagnosis not present

## 2017-04-29 DIAGNOSIS — I130 Hypertensive heart and chronic kidney disease with heart failure and stage 1 through stage 4 chronic kidney disease, or unspecified chronic kidney disease: Secondary | ICD-10-CM | POA: Diagnosis not present

## 2017-05-03 ENCOUNTER — Telehealth: Payer: Self-pay | Admitting: *Deleted

## 2017-05-03 ENCOUNTER — Other Ambulatory Visit: Payer: Self-pay | Admitting: Internal Medicine

## 2017-05-03 DIAGNOSIS — M5136 Other intervertebral disc degeneration, lumbar region: Secondary | ICD-10-CM | POA: Diagnosis not present

## 2017-05-03 DIAGNOSIS — E1122 Type 2 diabetes mellitus with diabetic chronic kidney disease: Secondary | ICD-10-CM | POA: Diagnosis not present

## 2017-05-03 DIAGNOSIS — M4802 Spinal stenosis, cervical region: Secondary | ICD-10-CM | POA: Diagnosis not present

## 2017-05-03 DIAGNOSIS — D631 Anemia in chronic kidney disease: Secondary | ICD-10-CM | POA: Diagnosis not present

## 2017-05-03 DIAGNOSIS — E785 Hyperlipidemia, unspecified: Secondary | ICD-10-CM | POA: Diagnosis not present

## 2017-05-03 DIAGNOSIS — J9601 Acute respiratory failure with hypoxia: Secondary | ICD-10-CM | POA: Diagnosis not present

## 2017-05-03 DIAGNOSIS — T8089XD Other complications following infusion, transfusion and therapeutic injection, subsequent encounter: Secondary | ICD-10-CM | POA: Diagnosis not present

## 2017-05-03 DIAGNOSIS — I2510 Atherosclerotic heart disease of native coronary artery without angina pectoris: Secondary | ICD-10-CM | POA: Diagnosis not present

## 2017-05-03 DIAGNOSIS — N189 Chronic kidney disease, unspecified: Secondary | ICD-10-CM | POA: Diagnosis not present

## 2017-05-03 DIAGNOSIS — I5032 Chronic diastolic (congestive) heart failure: Secondary | ICD-10-CM | POA: Diagnosis not present

## 2017-05-03 DIAGNOSIS — I130 Hypertensive heart and chronic kidney disease with heart failure and stage 1 through stage 4 chronic kidney disease, or unspecified chronic kidney disease: Secondary | ICD-10-CM | POA: Diagnosis not present

## 2017-05-03 DIAGNOSIS — E1140 Type 2 diabetes mellitus with diabetic neuropathy, unspecified: Secondary | ICD-10-CM | POA: Diagnosis not present

## 2017-05-03 DIAGNOSIS — N179 Acute kidney failure, unspecified: Secondary | ICD-10-CM | POA: Diagnosis not present

## 2017-05-03 NOTE — Telephone Encounter (Signed)
[facility] calls for VO continuation of care- 2x week 8 weeks, VO given, do you approve?

## 2017-05-04 NOTE — Telephone Encounter (Signed)
Approved.  

## 2017-05-05 DIAGNOSIS — N183 Chronic kidney disease, stage 3 (moderate): Secondary | ICD-10-CM | POA: Diagnosis not present

## 2017-05-05 DIAGNOSIS — L98499 Non-pressure chronic ulcer of skin of other sites with unspecified severity: Secondary | ICD-10-CM | POA: Diagnosis not present

## 2017-05-05 DIAGNOSIS — E1122 Type 2 diabetes mellitus with diabetic chronic kidney disease: Secondary | ICD-10-CM | POA: Diagnosis not present

## 2017-05-05 DIAGNOSIS — E1151 Type 2 diabetes mellitus with diabetic peripheral angiopathy without gangrene: Secondary | ICD-10-CM | POA: Diagnosis not present

## 2017-05-05 DIAGNOSIS — E11622 Type 2 diabetes mellitus with other skin ulcer: Secondary | ICD-10-CM | POA: Diagnosis not present

## 2017-05-05 DIAGNOSIS — E1140 Type 2 diabetes mellitus with diabetic neuropathy, unspecified: Secondary | ICD-10-CM | POA: Diagnosis not present

## 2017-05-05 DIAGNOSIS — L98492 Non-pressure chronic ulcer of skin of other sites with fat layer exposed: Secondary | ICD-10-CM | POA: Diagnosis not present

## 2017-05-05 DIAGNOSIS — I130 Hypertensive heart and chronic kidney disease with heart failure and stage 1 through stage 4 chronic kidney disease, or unspecified chronic kidney disease: Secondary | ICD-10-CM | POA: Diagnosis not present

## 2017-05-05 DIAGNOSIS — I509 Heart failure, unspecified: Secondary | ICD-10-CM | POA: Diagnosis not present

## 2017-05-07 DIAGNOSIS — I130 Hypertensive heart and chronic kidney disease with heart failure and stage 1 through stage 4 chronic kidney disease, or unspecified chronic kidney disease: Secondary | ICD-10-CM | POA: Diagnosis not present

## 2017-05-07 DIAGNOSIS — N189 Chronic kidney disease, unspecified: Secondary | ICD-10-CM | POA: Diagnosis not present

## 2017-05-07 DIAGNOSIS — N179 Acute kidney failure, unspecified: Secondary | ICD-10-CM | POA: Diagnosis not present

## 2017-05-07 DIAGNOSIS — M5136 Other intervertebral disc degeneration, lumbar region: Secondary | ICD-10-CM | POA: Diagnosis not present

## 2017-05-07 DIAGNOSIS — T8089XD Other complications following infusion, transfusion and therapeutic injection, subsequent encounter: Secondary | ICD-10-CM | POA: Diagnosis not present

## 2017-05-07 DIAGNOSIS — I5032 Chronic diastolic (congestive) heart failure: Secondary | ICD-10-CM | POA: Diagnosis not present

## 2017-05-07 DIAGNOSIS — D631 Anemia in chronic kidney disease: Secondary | ICD-10-CM | POA: Diagnosis not present

## 2017-05-07 DIAGNOSIS — J9601 Acute respiratory failure with hypoxia: Secondary | ICD-10-CM | POA: Diagnosis not present

## 2017-05-07 DIAGNOSIS — M4802 Spinal stenosis, cervical region: Secondary | ICD-10-CM | POA: Diagnosis not present

## 2017-05-07 DIAGNOSIS — E1122 Type 2 diabetes mellitus with diabetic chronic kidney disease: Secondary | ICD-10-CM | POA: Diagnosis not present

## 2017-05-07 DIAGNOSIS — I2510 Atherosclerotic heart disease of native coronary artery without angina pectoris: Secondary | ICD-10-CM | POA: Diagnosis not present

## 2017-05-07 DIAGNOSIS — E785 Hyperlipidemia, unspecified: Secondary | ICD-10-CM | POA: Diagnosis not present

## 2017-05-07 DIAGNOSIS — E1140 Type 2 diabetes mellitus with diabetic neuropathy, unspecified: Secondary | ICD-10-CM | POA: Diagnosis not present

## 2017-05-10 ENCOUNTER — Other Ambulatory Visit: Payer: Medicare Other | Admitting: *Deleted

## 2017-05-10 ENCOUNTER — Ambulatory Visit (INDEPENDENT_AMBULATORY_CARE_PROVIDER_SITE_OTHER): Payer: Medicare Other | Admitting: Pharmacist

## 2017-05-10 ENCOUNTER — Encounter: Payer: Self-pay | Admitting: Pharmacist

## 2017-05-10 VITALS — BP 146/68 | HR 80

## 2017-05-10 DIAGNOSIS — E1140 Type 2 diabetes mellitus with diabetic neuropathy, unspecified: Secondary | ICD-10-CM | POA: Diagnosis not present

## 2017-05-10 DIAGNOSIS — T8089XD Other complications following infusion, transfusion and therapeutic injection, subsequent encounter: Secondary | ICD-10-CM | POA: Diagnosis not present

## 2017-05-10 DIAGNOSIS — E785 Hyperlipidemia, unspecified: Secondary | ICD-10-CM

## 2017-05-10 DIAGNOSIS — I10 Essential (primary) hypertension: Secondary | ICD-10-CM | POA: Diagnosis not present

## 2017-05-10 DIAGNOSIS — N179 Acute kidney failure, unspecified: Secondary | ICD-10-CM | POA: Diagnosis not present

## 2017-05-10 DIAGNOSIS — I130 Hypertensive heart and chronic kidney disease with heart failure and stage 1 through stage 4 chronic kidney disease, or unspecified chronic kidney disease: Secondary | ICD-10-CM | POA: Diagnosis not present

## 2017-05-10 DIAGNOSIS — J9601 Acute respiratory failure with hypoxia: Secondary | ICD-10-CM | POA: Diagnosis not present

## 2017-05-10 DIAGNOSIS — M5136 Other intervertebral disc degeneration, lumbar region: Secondary | ICD-10-CM | POA: Diagnosis not present

## 2017-05-10 DIAGNOSIS — N189 Chronic kidney disease, unspecified: Secondary | ICD-10-CM | POA: Diagnosis not present

## 2017-05-10 DIAGNOSIS — I2510 Atherosclerotic heart disease of native coronary artery without angina pectoris: Secondary | ICD-10-CM | POA: Diagnosis not present

## 2017-05-10 DIAGNOSIS — E1122 Type 2 diabetes mellitus with diabetic chronic kidney disease: Secondary | ICD-10-CM | POA: Diagnosis not present

## 2017-05-10 DIAGNOSIS — D631 Anemia in chronic kidney disease: Secondary | ICD-10-CM | POA: Diagnosis not present

## 2017-05-10 DIAGNOSIS — M4802 Spinal stenosis, cervical region: Secondary | ICD-10-CM | POA: Diagnosis not present

## 2017-05-10 DIAGNOSIS — I5032 Chronic diastolic (congestive) heart failure: Secondary | ICD-10-CM | POA: Diagnosis not present

## 2017-05-10 LAB — LIPID PANEL
Chol/HDL Ratio: 2.2 ratio (ref 0.0–4.4)
Cholesterol, Total: 130 mg/dL (ref 100–199)
HDL: 58 mg/dL (ref >39)
LDL Calculated: 57 mg/dL (ref 0–99)
Triglycerides: 73 mg/dL (ref 0–149)
VLDL Cholesterol Cal: 15 mg/dL (ref 5–40)

## 2017-05-10 LAB — HEPATIC FUNCTION PANEL
ALT: 5 IU/L (ref 0–32)
AST: 12 IU/L (ref 0–40)
Albumin: 3.7 g/dL (ref 3.5–4.8)
Alkaline Phosphatase: 87 IU/L (ref 39–117)
Bilirubin Total: 0.6 mg/dL (ref 0.0–1.2)
Bilirubin, Direct: 0.13 mg/dL (ref 0.00–0.40)
Total Protein: 7 g/dL (ref 6.0–8.5)

## 2017-05-10 MED ORDER — ATENOLOL 50 MG PO TABS: 50.0000 mg | ORAL_TABLET | Freq: Every day | ORAL | 3 refills | Status: DC

## 2017-05-10 NOTE — Progress Notes (Signed)
Patient ID: [name] [name]                 DOB: 1946/03/14                      MRN: [id number]     HPI: [name] [name] is a 71 y.o. female patient of Dr Meda Coffee referred by Lyda Jester, PA to HTN clinic. PMH is significant for non obstructive CAD, HTN, HLD, DM, and PVD. Pt used to take benazepril, furosemide, and amlodipine, however these were discontinued during May 2018 hospitalization for acute metabolic encephalopathy. Since then, her BP has been running on the higher side. She was started on amlodipine 5mg  daily at office visit 2 weeks ago with Brittainy, and presents today for follow up and further management.   Pt presents today in good spirits with her daughter. She states she never picked up amlodipine because the pharmacy told her she didn't have any prescriptions. Called [facility] and was informed that they sold a 90 day supply of amlodipine 5mg  to pt on 7/3 (new rx sent in 7/10 at [place] however pt had active rx on file since she took amlodipine prior to hospitalization). Pt and daughter deny picking this up. She does not bring her medications with her today so I am unable to tell if she ever picked up amlodipine. Her systolic BP is ~01 points lower than at her last visit which would indicate pt starting amlodipine, however she is confident she did not do so. She did take amlodipine prior to her hospitalization so I wonder if her supply was on automatic refill.  Pt does not check her BP at home. She does have a home health nurse coming a few days a week who checks her BP, although pt cannot recall any readings. She has not taken her medication yet this morning. She has not had any caffeine today either, but states that sometimes she drinks a cup of coffee in the AM. She tries to limit salt in her diet. Exercise is minimal.  Current HTN meds: atenolol 25mg  daily, Imdur 30mg  daily Previously tried: benazepril - stopped during hospitalization when pt had AKI, now resolved BP goal:  <130/21mmHg  Family History: Father with hx of DM and HTN, sister with hx of HTN, DM, and HLD.  Social History: Denies tobacco, alcohol, and illicit drug use.  Diet: Will sometimes have either a coffee or soda. Does not add salt to her food. She bakes her meat and does not eat fried food.  Exercise: Minimal. Does walk at the grocery store.  Home BP readings: Does not have a cuff  Wt Readings from Last 3 Encounters:  04/22/17 232 lb (105.2 kg)  04/20/17 199 lb (90.3 kg)  04/08/17 202 lb 1.6 oz (91.7 kg)   BP Readings from Last 3 Encounters:  04/22/17 (!) 150/70  04/20/17 (!) 168/76  04/08/17 (!) 158/64   Pulse Readings from Last 3 Encounters:  04/20/17 86  04/08/17 72  03/10/17 79    Renal function: CrCl cannot be calculated (Patient's most recent lab result is older than the maximum 21 days allowed.).  Past Medical History:  Diagnosis Date  . Arthritis   . CAD 08/26/2006   Cath 07/05:multiple areas of nonobstructive disease = medical & RF mgmt, well preserved global systolic function. Dr. Lia Foyer. Nuclear med stress test 01/2014 : Normal stress nuclear study. LV Ejection Fraction: 76%. LV Wall Motion: NL LV Function; NL Wall Motion.      [name]  Chronic pain syndrome 01/03/2013   Multifactorial. Non opioid requiring.   L2-5 fusion, with hardware at L2-3, stable per MRI 2010. Followed by neurosurgery (Dr. 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    [name] DEPRESSION 08/26/2006   On paxil    . DIABETES MELLITUS, TYPE II 08/26/2006   Insulin dependent. Victoza added 03/2014  . DYSLIPIDEMIA 11/01/2006   LDL goal < 100, 70 if can achieve without side effects.     . Gallstones   . GERD 08/26/2006   Heartburn QHS.     [name] HYPERTENSION 08/26/2006   On BB, ACEI, lasix, norvasc, metolazone, and imdur.     . OBESITY 08/26/2006   BMI 39.5. Obesity Class 2.     . PVD (peripheral vascular disease) ([place]) 03/27/2014   2015 LE  arterial Duplex - Possible mild inflow disease on the left. 0-49% bilateral SFA disease, without focal stenosis. Three vesel run-off, bilaterally. Medical tx.     Current Outpatient Prescriptions on File Prior to Visit  Medication Sig Dispense Refill  . Acetaminophen 650 MG TABS Take 650 mg by mouth 3 (three) times daily.    [name] amLODipine (NORVASC) 5 MG tablet Take 1 tablet (5 mg total) by mouth daily. 180 tablet 3  . aspirin 81 MG tablet Take 1 tablet (81 mg total) by mouth daily. 90 tablet 3  . ASSURE COMFORT LANCETS 30G MISC E11.59. Use to check blood sugar three times a day 100 each 5  . atenolol (TENORMIN) 25 MG tablet Take 1 tablet (25 mg total) by mouth daily. 90 tablet 3  . atorvastatin (LIPITOR) 10 MG tablet Take 1 tablet (10 mg total) by mouth daily. 90 tablet 3  . calcium citrate-vitamin D 500-400 MG-UNIT chewable tablet Chew 1 tablet by mouth daily.    [name] glucose blood (ACCU-CHEK AVIVA PLUS) test strip E11.59. Use to check blood sugar three times a day 100 each 5  . Insulin Glargine (LANTUS SOLOSTAR) 100 UNIT/ML Solostar Pen Inject 10 Units into the skin daily at 10 pm. 5 pen 2  . isosorbide mononitrate (IMDUR) 30 MG 24 hr tablet Take 1 tablet (30 mg total) by mouth daily. 90 tablet 3  . lidocaine (XYLOCAINE) 5 % ointment Apply 2 grams up to 4 times daily to affected area 744.24 g 3  . liraglutide (VICTOZA) 18 MG/3ML SOPN inject 1.2 milligram subcutaneously daily 15 mL 2  . loratadine (CLARITIN) 10 MG tablet take 1 tablet by mouth once daily 30 tablet 11  . magnesium chloride (SLOW-MAG) 64 MG TBEC SR tablet Take 1 tablet (64 mg total) by mouth daily. 180 tablet 3  . nitroGLYCERIN (NITROSTAT) 0.4 MG SL tablet Place 1 tablet (0.4 mg total) under the tongue every 5 (five) minutes as needed for chest pain. 25 tablet 3  . nystatin (NYSTATIN) powder Apply topically 4 (four) times daily. 15 g 0  . PARoxetine (PAXIL) 40 MG tablet Take 1 tablet (40 mg total) by mouth every morning. 90 tablet 3   . ranitidine (ZANTAC) 150 MG tablet Take 1 tablet (150 mg total) by mouth daily. 180 tablet 3  . traMADol (ULTRAM) 50 MG tablet Take 1 tablet (50 mg total) by mouth every 12 (twelve) hours as needed. 60 tablet 1  . traMADol (ULTRAM) 50 MG tablet Take 1 tablet (50 mg total) by mouth 2 (two) times daily. 60 tablet 1   No current facility-administered medications on file prior to visit.  No Known Allergies   Assessment/Plan:  1. Hypertension - BP remains above goal <130/22mmHg. Pt states she never picked up her amlodipine 5mg  prescribed at last visit, although pharmacy states they dispensed a 90 day supply a week prior to that visit. Drop in systolic BP of ~20 points since last visit would seem to indicate that pt started amlodipine, however she is certain she did not pick this up. Will increase atenolol to 50mg  daily and f/u with pt in 3 weeks in HTN clinic. Advised her to bring all home medications to this visit so I can assess what BP medications patient is taking. Also advised pt to record BP readings that her home health nurse checks and to bring these in as well.   Megan E. Supple, PharmD, CPP, [place] [address] [place], [place] [address] Phone: [phone]; Fax: [phone] 05/10/2017 9:38 AM

## 2017-05-10 NOTE — Patient Instructions (Addendum)
Increase your atenolol to 50mg  once a day - pick up your new prescription and start taking 1 tablet each day  Continue taking all of your other medicine  Look through all of your medicines at home and see if you have any amlodipine  Have your nurse write down your blood pressure readings at home and bring these to your next visit  Follow up in clinic in 3 weeks - please bring in all of your medicine to this visit

## 2017-05-12 ENCOUNTER — Ambulatory Visit
Admission: RE | Admit: 2017-05-12 | Discharge: 2017-05-12 | Disposition: A | Discharge status: Home / Self Care | Payer: Medicare Other | Source: Ambulatory Visit | Attending: Sports Medicine | Admitting: Sports Medicine

## 2017-05-12 ENCOUNTER — Encounter (HOSPITAL_BASED_OUTPATIENT_CLINIC_OR_DEPARTMENT_OTHER): Payer: Medicare Other | Attending: Surgery

## 2017-05-12 DIAGNOSIS — Z6837 Body mass index (BMI) 37.0-37.9, adult: Secondary | ICD-10-CM | POA: Diagnosis not present

## 2017-05-12 DIAGNOSIS — E11622 Type 2 diabetes mellitus with other skin ulcer: Secondary | ICD-10-CM | POA: Diagnosis not present

## 2017-05-12 DIAGNOSIS — I129 Hypertensive chronic kidney disease with stage 1 through stage 4 chronic kidney disease, or unspecified chronic kidney disease: Secondary | ICD-10-CM | POA: Insufficient documentation

## 2017-05-12 DIAGNOSIS — I2510 Atherosclerotic heart disease of native coronary artery without angina pectoris: Secondary | ICD-10-CM | POA: Insufficient documentation

## 2017-05-12 DIAGNOSIS — E669 Obesity, unspecified: Secondary | ICD-10-CM | POA: Diagnosis not present

## 2017-05-12 DIAGNOSIS — L98499 Non-pressure chronic ulcer of skin of other sites with unspecified severity: Secondary | ICD-10-CM | POA: Diagnosis not present

## 2017-05-12 DIAGNOSIS — E1151 Type 2 diabetes mellitus with diabetic peripheral angiopathy without gangrene: Secondary | ICD-10-CM | POA: Diagnosis not present

## 2017-05-12 DIAGNOSIS — E1122 Type 2 diabetes mellitus with diabetic chronic kidney disease: Secondary | ICD-10-CM | POA: Insufficient documentation

## 2017-05-12 DIAGNOSIS — L98492 Non-pressure chronic ulcer of skin of other sites with fat layer exposed: Secondary | ICD-10-CM | POA: Diagnosis not present

## 2017-05-12 DIAGNOSIS — N183 Chronic kidney disease, stage 3 (moderate): Secondary | ICD-10-CM | POA: Diagnosis not present

## 2017-05-12 DIAGNOSIS — E1140 Type 2 diabetes mellitus with diabetic neuropathy, unspecified: Secondary | ICD-10-CM | POA: Insufficient documentation

## 2017-05-12 DIAGNOSIS — M47813 Spondylosis without myelopathy or radiculopathy, cervicothoracic region: Secondary | ICD-10-CM | POA: Diagnosis not present

## 2017-05-12 MED ORDER — TRIAMCINOLONE ACETONIDE 40 MG/ML IJ SUSP (RADIOLOGY)
60.0000 mg | Freq: Once | INTRAMUSCULAR | Status: AC
  Administered 2017-05-12: 60 mg via EPIDURAL

## 2017-05-12 MED ORDER — IOPAMIDOL (ISOVUE-M 300) INJECTION 61%
1.0000 mL | Freq: Once | INTRAMUSCULAR | Status: AC | PRN
  Administered 2017-05-12: 1 mL via EPIDURAL

## 2017-05-12 NOTE — Discharge Instructions (Signed)

## 2017-05-13 DIAGNOSIS — D631 Anemia in chronic kidney disease: Secondary | ICD-10-CM | POA: Diagnosis not present

## 2017-05-13 DIAGNOSIS — I5032 Chronic diastolic (congestive) heart failure: Secondary | ICD-10-CM | POA: Diagnosis not present

## 2017-05-13 DIAGNOSIS — I2510 Atherosclerotic heart disease of native coronary artery without angina pectoris: Secondary | ICD-10-CM | POA: Diagnosis not present

## 2017-05-13 DIAGNOSIS — M4802 Spinal stenosis, cervical region: Secondary | ICD-10-CM | POA: Diagnosis not present

## 2017-05-13 DIAGNOSIS — N179 Acute kidney failure, unspecified: Secondary | ICD-10-CM | POA: Diagnosis not present

## 2017-05-13 DIAGNOSIS — J9601 Acute respiratory failure with hypoxia: Secondary | ICD-10-CM | POA: Diagnosis not present

## 2017-05-13 DIAGNOSIS — E1140 Type 2 diabetes mellitus with diabetic neuropathy, unspecified: Secondary | ICD-10-CM | POA: Diagnosis not present

## 2017-05-13 DIAGNOSIS — M5136 Other intervertebral disc degeneration, lumbar region: Secondary | ICD-10-CM | POA: Diagnosis not present

## 2017-05-13 DIAGNOSIS — N189 Chronic kidney disease, unspecified: Secondary | ICD-10-CM | POA: Diagnosis not present

## 2017-05-13 DIAGNOSIS — T8089XD Other complications following infusion, transfusion and therapeutic injection, subsequent encounter: Secondary | ICD-10-CM | POA: Diagnosis not present

## 2017-05-13 DIAGNOSIS — I130 Hypertensive heart and chronic kidney disease with heart failure and stage 1 through stage 4 chronic kidney disease, or unspecified chronic kidney disease: Secondary | ICD-10-CM | POA: Diagnosis not present

## 2017-05-13 DIAGNOSIS — E1122 Type 2 diabetes mellitus with diabetic chronic kidney disease: Secondary | ICD-10-CM | POA: Diagnosis not present

## 2017-05-13 DIAGNOSIS — E785 Hyperlipidemia, unspecified: Secondary | ICD-10-CM | POA: Diagnosis not present

## 2017-05-17 DIAGNOSIS — I130 Hypertensive heart and chronic kidney disease with heart failure and stage 1 through stage 4 chronic kidney disease, or unspecified chronic kidney disease: Secondary | ICD-10-CM | POA: Diagnosis not present

## 2017-05-17 DIAGNOSIS — T8089XD Other complications following infusion, transfusion and therapeutic injection, subsequent encounter: Secondary | ICD-10-CM | POA: Diagnosis not present

## 2017-05-17 DIAGNOSIS — J9601 Acute respiratory failure with hypoxia: Secondary | ICD-10-CM | POA: Diagnosis not present

## 2017-05-17 DIAGNOSIS — E1122 Type 2 diabetes mellitus with diabetic chronic kidney disease: Secondary | ICD-10-CM | POA: Diagnosis not present

## 2017-05-17 DIAGNOSIS — I2510 Atherosclerotic heart disease of native coronary artery without angina pectoris: Secondary | ICD-10-CM | POA: Diagnosis not present

## 2017-05-17 DIAGNOSIS — N179 Acute kidney failure, unspecified: Secondary | ICD-10-CM | POA: Diagnosis not present

## 2017-05-17 DIAGNOSIS — E785 Hyperlipidemia, unspecified: Secondary | ICD-10-CM | POA: Diagnosis not present

## 2017-05-17 DIAGNOSIS — N189 Chronic kidney disease, unspecified: Secondary | ICD-10-CM | POA: Diagnosis not present

## 2017-05-17 DIAGNOSIS — E1140 Type 2 diabetes mellitus with diabetic neuropathy, unspecified: Secondary | ICD-10-CM | POA: Diagnosis not present

## 2017-05-17 DIAGNOSIS — M5136 Other intervertebral disc degeneration, lumbar region: Secondary | ICD-10-CM | POA: Diagnosis not present

## 2017-05-17 DIAGNOSIS — M4802 Spinal stenosis, cervical region: Secondary | ICD-10-CM | POA: Diagnosis not present

## 2017-05-17 DIAGNOSIS — D631 Anemia in chronic kidney disease: Secondary | ICD-10-CM | POA: Diagnosis not present

## 2017-05-17 DIAGNOSIS — I5032 Chronic diastolic (congestive) heart failure: Secondary | ICD-10-CM | POA: Diagnosis not present

## 2017-05-19 DIAGNOSIS — N183 Chronic kidney disease, stage 3 (moderate): Secondary | ICD-10-CM | POA: Diagnosis not present

## 2017-05-19 DIAGNOSIS — E1122 Type 2 diabetes mellitus with diabetic chronic kidney disease: Secondary | ICD-10-CM | POA: Diagnosis not present

## 2017-05-19 DIAGNOSIS — I2510 Atherosclerotic heart disease of native coronary artery without angina pectoris: Secondary | ICD-10-CM | POA: Diagnosis not present

## 2017-05-19 DIAGNOSIS — L98492 Non-pressure chronic ulcer of skin of other sites with fat layer exposed: Secondary | ICD-10-CM | POA: Diagnosis not present

## 2017-05-19 DIAGNOSIS — E1151 Type 2 diabetes mellitus with diabetic peripheral angiopathy without gangrene: Secondary | ICD-10-CM | POA: Diagnosis not present

## 2017-05-19 DIAGNOSIS — L98499 Non-pressure chronic ulcer of skin of other sites with unspecified severity: Secondary | ICD-10-CM | POA: Diagnosis not present

## 2017-05-19 DIAGNOSIS — E11622 Type 2 diabetes mellitus with other skin ulcer: Secondary | ICD-10-CM | POA: Diagnosis not present

## 2017-05-19 DIAGNOSIS — E1140 Type 2 diabetes mellitus with diabetic neuropathy, unspecified: Secondary | ICD-10-CM | POA: Diagnosis not present

## 2017-05-19 DIAGNOSIS — I129 Hypertensive chronic kidney disease with stage 1 through stage 4 chronic kidney disease, or unspecified chronic kidney disease: Secondary | ICD-10-CM | POA: Diagnosis not present

## 2017-05-21 ENCOUNTER — Telehealth: Payer: Self-pay

## 2017-05-21 DIAGNOSIS — E1140 Type 2 diabetes mellitus with diabetic neuropathy, unspecified: Secondary | ICD-10-CM | POA: Diagnosis not present

## 2017-05-21 DIAGNOSIS — M5136 Other intervertebral disc degeneration, lumbar region: Secondary | ICD-10-CM | POA: Diagnosis not present

## 2017-05-21 DIAGNOSIS — I130 Hypertensive heart and chronic kidney disease with heart failure and stage 1 through stage 4 chronic kidney disease, or unspecified chronic kidney disease: Secondary | ICD-10-CM | POA: Diagnosis not present

## 2017-05-21 DIAGNOSIS — T8089XD Other complications following infusion, transfusion and therapeutic injection, subsequent encounter: Secondary | ICD-10-CM | POA: Diagnosis not present

## 2017-05-21 DIAGNOSIS — I5032 Chronic diastolic (congestive) heart failure: Secondary | ICD-10-CM | POA: Diagnosis not present

## 2017-05-21 DIAGNOSIS — M4802 Spinal stenosis, cervical region: Secondary | ICD-10-CM | POA: Diagnosis not present

## 2017-05-21 DIAGNOSIS — J9601 Acute respiratory failure with hypoxia: Secondary | ICD-10-CM | POA: Diagnosis not present

## 2017-05-21 DIAGNOSIS — N189 Chronic kidney disease, unspecified: Secondary | ICD-10-CM | POA: Diagnosis not present

## 2017-05-21 DIAGNOSIS — N179 Acute kidney failure, unspecified: Secondary | ICD-10-CM | POA: Diagnosis not present

## 2017-05-21 DIAGNOSIS — E1122 Type 2 diabetes mellitus with diabetic chronic kidney disease: Secondary | ICD-10-CM | POA: Diagnosis not present

## 2017-05-21 DIAGNOSIS — I2510 Atherosclerotic heart disease of native coronary artery without angina pectoris: Secondary | ICD-10-CM | POA: Diagnosis not present

## 2017-05-21 DIAGNOSIS — E785 Hyperlipidemia, unspecified: Secondary | ICD-10-CM | POA: Diagnosis not present

## 2017-05-21 DIAGNOSIS — D631 Anemia in chronic kidney disease: Secondary | ICD-10-CM | POA: Diagnosis not present

## 2017-05-21 NOTE — Telephone Encounter (Signed)
Please call patient having problems with throat.

## 2017-05-21 NOTE — Telephone Encounter (Signed)
Called pt, states she feels like something is caught in her throat sometimes, this is ongoing since her hosp stay

## 2017-05-24 ENCOUNTER — Ambulatory Visit (INDEPENDENT_AMBULATORY_CARE_PROVIDER_SITE_OTHER): Payer: Medicare Other | Admitting: Internal Medicine

## 2017-05-24 VITALS — BP 179/79 | HR 84 | Temp 98.5°F | Wt 205.7 lb

## 2017-05-24 DIAGNOSIS — Z8709 Personal history of other diseases of the respiratory system: Secondary | ICD-10-CM | POA: Diagnosis not present

## 2017-05-24 DIAGNOSIS — Z8249 Family history of ischemic heart disease and other diseases of the circulatory system: Secondary | ICD-10-CM | POA: Diagnosis not present

## 2017-05-24 DIAGNOSIS — J383 Other diseases of vocal cords: Secondary | ICD-10-CM

## 2017-05-24 DIAGNOSIS — I10 Essential (primary) hypertension: Secondary | ICD-10-CM

## 2017-05-24 DIAGNOSIS — R0989 Other specified symptoms and signs involving the circulatory and respiratory systems: Secondary | ICD-10-CM

## 2017-05-24 HISTORY — DX: Other diseases of vocal cords: J38.3

## 2017-05-24 NOTE — Assessment & Plan Note (Signed)
History of present illness Patient is complaining of a sensation of something struck in her throat. Patient was admitted to the hospital in May 2018 and intubated for metabolic encephalopathy. States the problem started a week after her hospital discharge when she started experiencing intermittent sharp pain in her throat. States this happens only during the day when she is talking and her voice becomes hoarse during these episodes. Denies having any fevers, chills, sore throat, dysphagia, odynophagia, or cough. Denies having any rhinorrhea, itchy eyes, or sneezing. Denies having any shortness of breath or snoring. Denies having any symptoms at night. Patient is a never smoker.  Assessment Unclear etiology of her intermittent sharp throat pain and a sensation of something stuck in her throat. Oropharynx clear and examined no exudate noted. It could possibly be related to her vocal cords as symptoms only occur when she is talking. She has no respiratory symptoms and does not seem to be related to post-nasal drip. Does not seem to be an obstructing mass as patient does not have any dysphagia or odynophagia. Malignancy less likely as she is a never smoker and no unintentional weight loss  Plan -Referral to ENT for laryngoscopy

## 2017-05-24 NOTE — Telephone Encounter (Signed)
Has appt today

## 2017-05-24 NOTE — Telephone Encounter (Signed)
Would be best addressed in an appt. Has Sept appt - may come in sooner if needed.

## 2017-05-24 NOTE — Progress Notes (Signed)
Internal Medicine Clinic Attending  Case discussed with Dr. Rathoreat the time of the visit. We reviewed the resident's history and exam and pertinent patient test results. I agree with the assessment, diagnosis, and plan of care documented in the resident's note.  

## 2017-05-24 NOTE — Assessment & Plan Note (Signed)
Assessment Blood pressure 179/79 at this visit. Patient did not take her medications this morning. Medications include atenolol 25 mg daily, isosorbide mononitrate 30 mg daily, and amlodipine 5 mg daily. She is currently asymptomatic. Denies having any headaches, chest pain, shortness of breath, or abdominal pain.  Plan -Resume current medications -Emphasized the importance of medication compliance -She has an appointment with her PCP on 06/17/2017. If blood pressure continues to be elevated at the next visit, consider resuming Benazepril as her renal function is back to baseline.

## 2017-05-24 NOTE — Patient Instructions (Signed)
Ms. Lippold it was nice meeting you today.  -Continue taking your blood pressure medicines daily as instructed.  -I have referred you to ENT for your throat problem.  -Return to the clinic on 06/17/2017 to see Dr. Lynnae January.

## 2017-05-24 NOTE — Progress Notes (Signed)
   CC: "Something is stuck in my throat." Hypertension was also discussed during this visit.  HPI:  [name] is a 70 y.o. female with a past medical history of conditions listed below presenting to the clinic complaining of something stuck in her throat. Hypertension was also discussed during this visit. Please see problem based charting for the status of the patient's current and chronic medical conditions.   Past Medical History:  Diagnosis Date  . Arthritis   . CAD 08/26/2006   Cath 07/05:multiple areas of nonobstructive disease = medical & RF mgmt, well preserved global systolic function. Dr. Lia Foyer. Nuclear med stress test 01/2014 : Normal stress nuclear study. LV Ejection Fraction: 76%. LV Wall Motion: NL LV Function; NL Wall Motion.      . Chronic pain syndrome 01/03/2013   Multifactorial. Non opioid requiring.   L2-5 fusion, with hardware at L2-3, stable per MRI 2010. Followed by neurosurgery (Dr. 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    [name] DEPRESSION 08/26/2006   On paxil    . DIABETES MELLITUS, TYPE II 08/26/2006   Insulin dependent. Victoza added 03/2014  . DYSLIPIDEMIA 11/01/2006   LDL goal < 100, 70 if can achieve without side effects.     . Gallstones   . GERD 08/26/2006   Heartburn QHS.     [name] HYPERTENSION 08/26/2006   On BB, ACEI, lasix, norvasc, metolazone, and imdur.     . OBESITY 08/26/2006   BMI 39.5. Obesity Class 2.     . PVD (peripheral vascular disease) ([place]) 03/27/2014   2015 LE arterial Duplex - Possible mild inflow disease on the left. 0-49% bilateral SFA disease, without focal stenosis. Three vesel run-off, bilaterally. Medical tx.    Review of Systems: Pertinent positives mentioned in HPI. Remainder of all ROS negative.   Physical Exam:  Vitals:   05/24/17 0904  BP: (!) 179/79  Pulse: 84  Temp: 98.5 F (36.9 C)  TempSrc: Oral  SpO2: 99%  Weight: 205 lb 11.2 oz (93.3 kg)    Physical Exam  Constitutional: She is oriented to person, place, and time. She appears well-developed and well-nourished. No distress.  HENT:  Head: Normocephalic and atraumatic.  Mouth/Throat: Oropharynx is clear and moist. No oropharyngeal exudate.  Eyes: Right eye exhibits no discharge. Left eye exhibits no discharge.  Neck: Neck supple. No tracheal deviation present.  Cardiovascular: Normal rate, regular rhythm and intact distal pulses.   Pulmonary/Chest: Effort normal and breath sounds normal. No respiratory distress. She has no wheezes. She has no rales.  Abdominal: Soft. Bowel sounds are normal. She exhibits no distension. There is no tenderness.  Musculoskeletal: She exhibits no edema.  Lymphadenopathy:    She has no cervical adenopathy.  Neurological: She is alert and oriented to person, place, and time.  Skin: Skin is warm and dry.  Psychiatric: She has a normal mood and affect. Her behavior is normal.    Assessment & Plan:   See Encounters Tab for problem based charting.  Patient discussed with Dr. Lynnae January

## 2017-05-25 DIAGNOSIS — N189 Chronic kidney disease, unspecified: Secondary | ICD-10-CM | POA: Diagnosis not present

## 2017-05-25 DIAGNOSIS — D631 Anemia in chronic kidney disease: Secondary | ICD-10-CM | POA: Diagnosis not present

## 2017-05-25 DIAGNOSIS — I5032 Chronic diastolic (congestive) heart failure: Secondary | ICD-10-CM | POA: Diagnosis not present

## 2017-05-25 DIAGNOSIS — E1140 Type 2 diabetes mellitus with diabetic neuropathy, unspecified: Secondary | ICD-10-CM | POA: Diagnosis not present

## 2017-05-25 DIAGNOSIS — T8089XD Other complications following infusion, transfusion and therapeutic injection, subsequent encounter: Secondary | ICD-10-CM | POA: Diagnosis not present

## 2017-05-25 DIAGNOSIS — N179 Acute kidney failure, unspecified: Secondary | ICD-10-CM | POA: Diagnosis not present

## 2017-05-25 DIAGNOSIS — E785 Hyperlipidemia, unspecified: Secondary | ICD-10-CM | POA: Diagnosis not present

## 2017-05-25 DIAGNOSIS — I130 Hypertensive heart and chronic kidney disease with heart failure and stage 1 through stage 4 chronic kidney disease, or unspecified chronic kidney disease: Secondary | ICD-10-CM | POA: Diagnosis not present

## 2017-05-25 DIAGNOSIS — J9601 Acute respiratory failure with hypoxia: Secondary | ICD-10-CM | POA: Diagnosis not present

## 2017-05-25 DIAGNOSIS — M5136 Other intervertebral disc degeneration, lumbar region: Secondary | ICD-10-CM | POA: Diagnosis not present

## 2017-05-25 DIAGNOSIS — I2510 Atherosclerotic heart disease of native coronary artery without angina pectoris: Secondary | ICD-10-CM | POA: Diagnosis not present

## 2017-05-25 DIAGNOSIS — M4802 Spinal stenosis, cervical region: Secondary | ICD-10-CM | POA: Diagnosis not present

## 2017-05-25 DIAGNOSIS — E1122 Type 2 diabetes mellitus with diabetic chronic kidney disease: Secondary | ICD-10-CM | POA: Diagnosis not present

## 2017-05-26 DIAGNOSIS — I2510 Atherosclerotic heart disease of native coronary artery without angina pectoris: Secondary | ICD-10-CM | POA: Diagnosis not present

## 2017-05-26 DIAGNOSIS — I129 Hypertensive chronic kidney disease with stage 1 through stage 4 chronic kidney disease, or unspecified chronic kidney disease: Secondary | ICD-10-CM | POA: Diagnosis not present

## 2017-05-26 DIAGNOSIS — E11622 Type 2 diabetes mellitus with other skin ulcer: Secondary | ICD-10-CM | POA: Diagnosis not present

## 2017-05-26 DIAGNOSIS — E1151 Type 2 diabetes mellitus with diabetic peripheral angiopathy without gangrene: Secondary | ICD-10-CM | POA: Diagnosis not present

## 2017-05-26 DIAGNOSIS — N183 Chronic kidney disease, stage 3 (moderate): Secondary | ICD-10-CM | POA: Diagnosis not present

## 2017-05-26 DIAGNOSIS — E1122 Type 2 diabetes mellitus with diabetic chronic kidney disease: Secondary | ICD-10-CM | POA: Diagnosis not present

## 2017-05-26 DIAGNOSIS — E1140 Type 2 diabetes mellitus with diabetic neuropathy, unspecified: Secondary | ICD-10-CM | POA: Diagnosis not present

## 2017-05-26 DIAGNOSIS — L98492 Non-pressure chronic ulcer of skin of other sites with fat layer exposed: Secondary | ICD-10-CM | POA: Diagnosis not present

## 2017-05-26 DIAGNOSIS — L98499 Non-pressure chronic ulcer of skin of other sites with unspecified severity: Secondary | ICD-10-CM | POA: Diagnosis not present

## 2017-05-28 ENCOUNTER — Other Ambulatory Visit (INDEPENDENT_AMBULATORY_CARE_PROVIDER_SITE_OTHER): Payer: Medicare Other

## 2017-05-28 ENCOUNTER — Ambulatory Visit (INDEPENDENT_AMBULATORY_CARE_PROVIDER_SITE_OTHER): Payer: Medicare Other | Admitting: Gastroenterology

## 2017-05-28 ENCOUNTER — Encounter: Payer: Self-pay | Admitting: Gastroenterology

## 2017-05-28 VITALS — BP 124/78 | HR 84 | Ht 60.5 in | Wt 204.0 lb

## 2017-05-28 DIAGNOSIS — M4802 Spinal stenosis, cervical region: Secondary | ICD-10-CM | POA: Diagnosis not present

## 2017-05-28 DIAGNOSIS — D649 Anemia, unspecified: Secondary | ICD-10-CM

## 2017-05-28 DIAGNOSIS — E1140 Type 2 diabetes mellitus with diabetic neuropathy, unspecified: Secondary | ICD-10-CM | POA: Diagnosis not present

## 2017-05-28 DIAGNOSIS — M5136 Other intervertebral disc degeneration, lumbar region: Secondary | ICD-10-CM | POA: Diagnosis not present

## 2017-05-28 DIAGNOSIS — I2510 Atherosclerotic heart disease of native coronary artery without angina pectoris: Secondary | ICD-10-CM | POA: Diagnosis not present

## 2017-05-28 DIAGNOSIS — T8089XD Other complications following infusion, transfusion and therapeutic injection, subsequent encounter: Secondary | ICD-10-CM | POA: Diagnosis not present

## 2017-05-28 DIAGNOSIS — E1122 Type 2 diabetes mellitus with diabetic chronic kidney disease: Secondary | ICD-10-CM | POA: Diagnosis not present

## 2017-05-28 DIAGNOSIS — J9601 Acute respiratory failure with hypoxia: Secondary | ICD-10-CM | POA: Diagnosis not present

## 2017-05-28 DIAGNOSIS — N189 Chronic kidney disease, unspecified: Secondary | ICD-10-CM | POA: Diagnosis not present

## 2017-05-28 DIAGNOSIS — I130 Hypertensive heart and chronic kidney disease with heart failure and stage 1 through stage 4 chronic kidney disease, or unspecified chronic kidney disease: Secondary | ICD-10-CM | POA: Diagnosis not present

## 2017-05-28 DIAGNOSIS — E785 Hyperlipidemia, unspecified: Secondary | ICD-10-CM | POA: Diagnosis not present

## 2017-05-28 DIAGNOSIS — I5032 Chronic diastolic (congestive) heart failure: Secondary | ICD-10-CM | POA: Diagnosis not present

## 2017-05-28 DIAGNOSIS — N179 Acute kidney failure, unspecified: Secondary | ICD-10-CM | POA: Diagnosis not present

## 2017-05-28 DIAGNOSIS — D631 Anemia in chronic kidney disease: Secondary | ICD-10-CM | POA: Diagnosis not present

## 2017-05-28 LAB — CBC WITH DIFFERENTIAL/PLATELET
Basophils Absolute: 0.1 10*3/uL (ref 0.0–0.1)
Basophils Relative: 1.4 % (ref 0.0–3.0)
Eosinophils Absolute: 0.1 10*3/uL (ref 0.0–0.7)
Eosinophils Relative: 1.1 % (ref 0.0–5.0)
HCT: 33.1 % — ABNORMAL LOW (ref 36.0–46.0)
Hemoglobin: 10.9 g/dL — ABNORMAL LOW (ref 12.0–15.0)
Lymphocytes Relative: 34.6 % (ref 12.0–46.0)
Lymphs Abs: 2.3 10*3/uL (ref 0.7–4.0)
MCHC: 32.9 g/dL (ref 30.0–36.0)
MCV: 87.6 fl (ref 78.0–100.0)
Monocytes Absolute: 0.3 10*3/uL (ref 0.1–1.0)
Monocytes Relative: 4.7 % (ref 3.0–12.0)
Neutro Abs: 3.9 10*3/uL (ref 1.4–7.7)
Neutrophils Relative %: 58.2 % (ref 43.0–77.0)
Platelets: 318 10*3/uL (ref 150.0–400.0)
RBC: 3.78 Mil/uL — ABNORMAL LOW (ref 3.87–5.11)
RDW: 17.2 % — ABNORMAL HIGH (ref 11.5–15.5)
WBC: 6.7 10*3/uL (ref 4.0–10.5)

## 2017-05-28 LAB — IRON AND TIBC
%SAT: 13 % (ref 11–50)
Iron: 40 ug/dL — ABNORMAL LOW (ref 45–160)
TIBC: 297 ug/dL (ref 250–450)
UIBC: 257 ug/dL

## 2017-05-28 LAB — FERRITIN: Ferritin: 39.8 ng/mL (ref 10.0–291.0)

## 2017-05-28 NOTE — Progress Notes (Signed)
HPI :  71 y/o female with history of DM, CAD, AKI, C Diff, here for a follow up visit.  Since her last visit with me she tested positive for C Diff in June 2017 and treated with oral vancomycin. This resolved her diarrhea per her report.   Unfortunately since our last visit she was hospitalized in May for AKI requiring HD, hypovolemic shock, required intubation for a period of time. She recovered from this hospitalization. She was noted to be anemic during this stay and when last checked on 6/13 she had a Hgb of 7.9. No iron studies done. She is not seeing any blood in the stools. She is off dialysis, had this only when inpatient. No diarrhea or issues with her bowels. She is having a bowe movement about 1-2 times per day. She was on iron previously but since stopped. Eating well. No reflux symptoms or dysphagia. No nausea or vomiting. Occasional lower abdominal pain with bowel movements.  She denies any FH of colon cancer.   Echocardiogram 02/26/17 - EF 70%  CT scan 02/24/17 - normal exam, appendix slightly enlarge but no inflammatory change  Colonoscopy 01/2009 - normal  Past Medical History:  Diagnosis Date  . Anemia   . Arthritis   . CAD 08/26/2006   Cath 07/05:multiple areas of nonobstructive disease = medical & RF mgmt, well preserved global systolic function. Dr. Lia Foyer. Nuclear med stress test 01/2014 : Normal stress nuclear study. LV Ejection Fraction: 76%. LV Wall Motion: NL LV Function; NL Wall Motion.      . Chronic pain syndrome 01/03/2013   Multifactorial. Non opioid requiring.   L2-5 fusion, with hardware at L2-3, stable per MRI 2010. Followed by neurosurgery (Dr. 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    [name] DEPRESSION 08/26/2006   On paxil    . DIABETES MELLITUS, TYPE II 08/26/2006   Insulin dependent. Victoza added 03/2014  . DYSLIPIDEMIA 11/01/2006   LDL goal < 100, 70 if can achieve without side effects.       . Gallstones   . GERD 08/26/2006   Heartburn QHS.     [name] HYPERTENSION 08/26/2006   On BB, ACEI, lasix, norvasc, metolazone, and imdur.     . OBESITY 08/26/2006   BMI 39.5. Obesity Class 2.     . PVD (peripheral vascular disease) ([place]) 03/27/2014   2015 LE arterial Duplex - Possible mild inflow disease on the left. 0-49% bilateral SFA disease, without focal stenosis. Three vesel run-off, bilaterally. Medical tx.      Past Surgical History:  Procedure Laterality Date  . CHOLECYSTECTOMY     pt denies 04/07/16  . endometrial biospy    . LEFT HEART CATH    . lumbar fusion surgery  10/08   L3-L5  . posterior lumbar interfusion surgery  07/18/07   L2-L3  . rotator cuff surgery Bilateral    Family History  Problem Relation Age of Onset  . Cirrhosis Mother   . Diabetes Father   . Hypertension Father   . Hypertension Sister   . Diabetes Sister   . Hyperlipidemia Sister   . Colon cancer Neg Hx   . Throat cancer Neg Hx    Social History  Substance Use Topics  . Smoking status: Passive Smoke Exposure - Never Smoker  . Smokeless tobacco: Never Used  . Alcohol use No   Current Outpatient Prescriptions  Medication Sig Dispense Refill  . Acetaminophen 650 MG  TABS Take 650 mg by mouth 3 (three) times daily.    [name] amLODipine (NORVASC) 5 MG tablet Take 1 tablet (5 mg total) by mouth daily. 180 tablet 3  . aspirin 81 MG tablet Take 1 tablet (81 mg total) by mouth daily. 90 tablet 3  . ASSURE COMFORT LANCETS 30G MISC E11.59. Use to check blood sugar three times a day 100 each 5  . atenolol (TENORMIN) 50 MG tablet Take 1 tablet (50 mg total) by mouth daily. 90 tablet 3  . atorvastatin (LIPITOR) 10 MG tablet Take 1 tablet (10 mg total) by mouth daily. 90 tablet 3  . calcium citrate-vitamin D 500-400 MG-UNIT chewable tablet Chew 1 tablet by mouth daily.    [name] glucose blood (ACCU-CHEK AVIVA PLUS) test strip E11.59. Use to check blood sugar three times a day 100 each 5  . Insulin Glargine  (LANTUS SOLOSTAR) 100 UNIT/ML Solostar Pen Inject 10 Units into the skin daily at 10 pm. 5 pen 2  . isosorbide mononitrate (IMDUR) 30 MG 24 hr tablet Take 1 tablet (30 mg total) by mouth daily. 90 tablet 3  . lidocaine (XYLOCAINE) 5 % ointment Apply 2 grams up to 4 times daily to affected area 744.24 g 3  . liraglutide (VICTOZA) 18 MG/3ML SOPN inject 1.2 milligram subcutaneously daily 15 mL 2  . loratadine (CLARITIN) 10 MG tablet take 1 tablet by mouth once daily 30 tablet 11  . magnesium chloride (SLOW-MAG) 64 MG TBEC SR tablet Take 1 tablet (64 mg total) by mouth daily. 180 tablet 3  . nitroGLYCERIN (NITROSTAT) 0.4 MG SL tablet Place 1 tablet (0.4 mg total) under the tongue every 5 (five) minutes as needed for chest pain. 25 tablet 3  . PARoxetine (PAXIL) 40 MG tablet Take 1 tablet (40 mg total) by mouth every morning. 90 tablet 3  . ranitidine (ZANTAC) 150 MG tablet Take 1 tablet (150 mg total) by mouth daily. 180 tablet 3  . traMADol (ULTRAM) 50 MG tablet Take 1 tablet (50 mg total) by mouth 2 (two) times daily. 60 tablet 1   No current facility-administered medications for this visit.    No Known Allergies   Review of Systems: All systems reviewed and negative except where noted in HPI.    Dg Inject Diag/thera/inc Needle/cath/plc Epi/cerv/thor W/img  Result Date: 05/12/2017 CLINICAL DATA:  Spondylosis without myelopathy. Symptoms more pronounced on the right presently. Good response to previous left-sided injection. FLUOROSCOPY TIME:  0 minutes 35 seconds. 17.69 micro gray meter squared PROCEDURE: CERVICAL EPIDURAL INJECTION An interlaminar approach was performed on the right at C7-T1 . A 20 gauge epidural needle was advanced using loss-of-resistance technique. DIAGNOSTIC EPIDURAL INJECTION Injection of Isovue-M 300 shows a good epidural pattern with spread above and below the level of needle placement, primarily on the right. No vascular opacification is seen. THERAPEUTIC EPIDURAL  INJECTION 1.5 ml of Kenalog 40 mixed with 1 ml of 1% Lidocaine and 2 ml of normal saline were then instilled. The procedure was well-tolerated, and the patient was discharged thirty minutes following the injection in good condition. IMPRESSION: Technically successful repeat epidural injection on the right at C7-T1. Electronically Signed   By: Nelson Chimes M.D.   On: 05/12/2017 10:57    Physical Exam: BP 124/78   Pulse 84   Ht 5' 0.5" (1.537 m) Comment: without shoes  Wt 204 lb (92.5 kg)   BMI 39.19 kg/m  Constitutional: Pleasant,female in no acute distress. HEENT: Normocephalic and atraumatic. Conjunctivae are normal.  No scleral icterus. Neck supple.  Cardiovascular: Normal rate, regular rhythm.  Pulmonary/chest: Effort normal and breath sounds normal. No wheezing, rales or rhonchi. Abdominal: Soft, nondistended, nontender. . There are no masses palpable. No hepatomegaly. Extremities: no edema Lymphadenopathy: No cervical adenopathy noted. Neurological: Alert and oriented to person place and time. Skin: Skin is warm and dry. No rashes noted. Psychiatric: Normal mood and affect. Behavior is normal.   ASSESSMENT AND PLAN: 71 year old female with a history of CKD who was admitted with acute kidney injury thought to be due to dehydration in the setting of use of diuretics, ACE inhibitor, and NSAIDs this past May, leading to temporary dialysis. Fortunately she recovered from this episode. During this course she was noted to be anemic with a hemoglobin of 7.9 and MCV in the low 80s. She's had no obvious evidence of GI bleeding, asymptomatic from this perspective.   The question is whether or not she had GI tract loss to account for her anemia or if this is multifactorial / due to chronic kidney disease. She has no symptoms of GI bleeding. She has not had a CBC in the past 2 months, I will repeat CBC today along with iron studies. Her anemia has resolved and iron studies normal, and lack of  symptoms I don't think she would need an endoscopy in that setting. However she has an iron deficiency anemia, I would recommend an EGD and colonoscopy to further evaluate. I discussed these issues with the patient and her daughter and they agreed with the plan. All questions answered.  [place] Winola Cellar, MD [facility] Pager [phone]  CC: Edrick Oh, MD

## 2017-05-28 NOTE — Patient Instructions (Signed)
If you are age 72 or older, your body mass index should be between 23-30. Your Body mass index is 39.19 kg/m. If this is out of the aforementioned range listed, please consider follow up with your Primary Care Provider.  If you are age 40 or younger, your body mass index should be between 19-25. Your Body mass index is 39.19 kg/m. If this is out of the aformentioned range listed, please consider follow up with your Primary Care Provider.   Your physician has requested that you go to the basement for the following lab work before leaving today:  CBC, Ferritn, TIBC  Thank you.

## 2017-05-31 ENCOUNTER — Other Ambulatory Visit: Payer: Self-pay

## 2017-05-31 DIAGNOSIS — D649 Anemia, unspecified: Secondary | ICD-10-CM

## 2017-05-31 NOTE — Progress Notes (Signed)
Patient ID: [name] [name]                 DOB: 09-Jun-1946                      MRN: [id number]     HPI: [name] [name] is a 71 y.o. female patient of Dr [name] [name] referred by [name] Jester, PA to HTN clinic. PMH is significant for non obstructive CAD, HTN, HLD, DM, and PVD. Pt used to take benazepril, furosemide, and amlodipine, however these were discontinued during May 2018 hospitalization for acute metabolic encephalopathy. Since then, her BP has been running on the higher side. She was started on amlodipine 5mg  daily at office visit on 7/10 with [name] [name]. At the last HTN clinic visit, pt reported that she never picked up her amlodipine 5mg  prescribed, although pharmacy states they dispensed a 90 day supply. Drop in systolic BP of ~40 points since last visit would seem to indicate that pt started amlodipine, however she is certain she did not pick this up. BP was still elevated, and atenolol was increased to 50 mg daily. Pt presents today for follow up.  Pt reports feeling very well. She denies dizziness, blurred vision, headache, or falls. She is tolerating her medications well. She had her BP checked at a GI appt on 8/17 - it was controlled at 124/78. Pt has also been checking her BP at home with readings of: 136/70, 115/62, 126/62, 124/64, 130/61.  She brings in all of her medications today. She has been taking her amlodipine 5mg  daily which she was unsure of at last visit. She has also been taking atenolol 50mg  from cardiology as well as an additional 50mg  from her PCP [name] [name]. Reports her energy level is fine and her HR has been in the 70-80s.  Current HTN meds: amlodipine 5 mg daily, atenolol 100 mg daily, Imdur 30 mg daily Previously tried: benazepril 40mg  - stopped during hospitalization when pt had AKI, now resolved BP goal: <130/66mmHg  Family History: Father with hx of DM and HTN, sister with hx of HTN, DM, and HLD.  Social History: Denies tobacco, alcohol, and illicit drug  use.  Diet: Will sometimes have either a [name] or soda. Does not add salt to her food. She bakes her meat and does not eat fried food.  Exercise: Minimal. Does walk at the grocery store.  Home BP readings: 136/70, 115/62, 126/62, 124/64, 130/61, HR 70-80s  Wt Readings from Last 3 Encounters:  05/28/17 204 lb (92.5 kg)  05/24/17 205 lb 11.2 oz (93.3 kg)  04/22/17 232 lb (105.2 kg)   BP Readings from Last 3 Encounters:  05/28/17 124/78  05/24/17 (!) 179/79  05/12/17 (!) 185/76   Pulse Readings from Last 3 Encounters:  05/28/17 84  05/24/17 84  05/12/17 76    Renal function: CrCl cannot be calculated (Patient's most recent lab result is older than the maximum 21 days allowed.).  Past Medical History:  Diagnosis Date  . Anemia   . Arthritis   . CAD 08/26/2006   Cath 07/05:multiple areas of nonobstructive disease = medical & RF mgmt, well preserved global systolic function. Dr. Lia Foyer. Nuclear med stress test 01/2014 : Normal stress nuclear study. LV Ejection Fraction: 76%. LV Wall Motion: NL LV Function; NL Wall Motion.      . Chronic pain syndrome 01/03/2013   Multifactorial. Non opioid requiring.   L2-5 fusion, with hardware at L2-3, stable per MRI 2010. Followed by neurosurgery (  Dr. 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    [name] DEPRESSION 08/26/2006   On paxil    . DIABETES MELLITUS, TYPE II 08/26/2006   Insulin dependent. Victoza added 03/2014  . DYSLIPIDEMIA 11/01/2006   LDL goal < 100, 70 if can achieve without side effects.     . Gallstones   . GERD 08/26/2006   Heartburn QHS.     [name] HYPERTENSION 08/26/2006   On BB, ACEI, lasix, norvasc, metolazone, and imdur.     . OBESITY 08/26/2006   BMI 39.5. Obesity Class 2.     . PVD (peripheral vascular disease) ([place]) 03/27/2014   2015 LE arterial Duplex - Possible mild inflow disease on the left. 0-49% bilateral SFA disease, without focal stenosis. Three vesel  run-off, bilaterally. Medical tx.     Current Outpatient Prescriptions on File Prior to Visit  Medication Sig Dispense Refill  . Acetaminophen 650 MG TABS Take 650 mg by mouth 3 (three) times daily.    [name] amLODipine (NORVASC) 5 MG tablet Take 1 tablet (5 mg total) by mouth daily. 180 tablet 3  . aspirin 81 MG tablet Take 1 tablet (81 mg total) by mouth daily. 90 tablet 3  . ASSURE COMFORT LANCETS 30G MISC E11.59. Use to check blood sugar three times a day 100 each 5  . atenolol (TENORMIN) 50 MG tablet Take 1 tablet (50 mg total) by mouth daily. 90 tablet 3  . atorvastatin (LIPITOR) 10 MG tablet Take 1 tablet (10 mg total) by mouth daily. 90 tablet 3  . calcium citrate-vitamin D 500-400 MG-UNIT chewable tablet Chew 1 tablet by mouth daily.    [name] glucose blood (ACCU-CHEK AVIVA PLUS) test strip E11.59. Use to check blood sugar three times a day 100 each 5  . Insulin Glargine (LANTUS SOLOSTAR) 100 UNIT/ML Solostar Pen Inject 10 Units into the skin daily at 10 pm. 5 pen 2  . isosorbide mononitrate (IMDUR) 30 MG 24 hr tablet Take 1 tablet (30 mg total) by mouth daily. 90 tablet 3  . lidocaine (XYLOCAINE) 5 % ointment Apply 2 grams up to 4 times daily to affected area 744.24 g 3  . liraglutide (VICTOZA) 18 MG/3ML SOPN inject 1.2 milligram subcutaneously daily 15 mL 2  . loratadine (CLARITIN) 10 MG tablet take 1 tablet by mouth once daily 30 tablet 11  . magnesium chloride (SLOW-MAG) 64 MG TBEC SR tablet Take 1 tablet (64 mg total) by mouth daily. 180 tablet 3  . nitroGLYCERIN (NITROSTAT) 0.4 MG SL tablet Place 1 tablet (0.4 mg total) under the tongue every 5 (five) minutes as needed for chest pain. 25 tablet 3  . PARoxetine (PAXIL) 40 MG tablet Take 1 tablet (40 mg total) by mouth every morning. 90 tablet 3  . ranitidine (ZANTAC) 150 MG tablet Take 1 tablet (150 mg total) by mouth daily. 180 tablet 3  . traMADol (ULTRAM) 50 MG tablet Take 1 tablet (50 mg total) by mouth 2 (two) times daily. 60 tablet 1     No current facility-administered medications on file prior to visit.     No Known Allergies   Assessment/Plan:  1. Hypertension - BP now at goal < 130/26mmHg on atenolol 100mg  daily and amlodipine 5mg  daily. Pt was getting amlodipine from both PCP and cardiology and doubling up on her dose. Since BP and HR are at goal and pt is feeling well, will continue on higher dose of atenolol 100mg  daily.  New rx sent to pharmacy, PCP and pharmacy both alerted to duplicate med and atenolol dose change. Pt advised to continue to monitor her BP at home and will call clinic with any future BP related concerns.   Asma Boldon E. Jabori Henegar, PharmD, CPP, [facility] [address] [place], [place] [address] Phone: [phone]; Fax: [phone] 06/01/2017 10:29 AM

## 2017-06-01 ENCOUNTER — Telehealth: Payer: Self-pay | Admitting: *Deleted

## 2017-06-01 ENCOUNTER — Ambulatory Visit (INDEPENDENT_AMBULATORY_CARE_PROVIDER_SITE_OTHER): Payer: Medicare Other | Admitting: Pharmacist

## 2017-06-01 VITALS — BP 118/62 | HR 80

## 2017-06-01 DIAGNOSIS — E1140 Type 2 diabetes mellitus with diabetic neuropathy, unspecified: Secondary | ICD-10-CM | POA: Diagnosis not present

## 2017-06-01 DIAGNOSIS — I10 Essential (primary) hypertension: Secondary | ICD-10-CM

## 2017-06-01 DIAGNOSIS — N179 Acute kidney failure, unspecified: Secondary | ICD-10-CM | POA: Diagnosis not present

## 2017-06-01 DIAGNOSIS — I130 Hypertensive heart and chronic kidney disease with heart failure and stage 1 through stage 4 chronic kidney disease, or unspecified chronic kidney disease: Secondary | ICD-10-CM | POA: Diagnosis not present

## 2017-06-01 DIAGNOSIS — E785 Hyperlipidemia, unspecified: Secondary | ICD-10-CM | POA: Diagnosis not present

## 2017-06-01 DIAGNOSIS — I5032 Chronic diastolic (congestive) heart failure: Secondary | ICD-10-CM | POA: Diagnosis not present

## 2017-06-01 DIAGNOSIS — T8089XD Other complications following infusion, transfusion and therapeutic injection, subsequent encounter: Secondary | ICD-10-CM | POA: Diagnosis not present

## 2017-06-01 DIAGNOSIS — E1122 Type 2 diabetes mellitus with diabetic chronic kidney disease: Secondary | ICD-10-CM | POA: Diagnosis not present

## 2017-06-01 DIAGNOSIS — I2510 Atherosclerotic heart disease of native coronary artery without angina pectoris: Secondary | ICD-10-CM | POA: Diagnosis not present

## 2017-06-01 DIAGNOSIS — M4802 Spinal stenosis, cervical region: Secondary | ICD-10-CM | POA: Diagnosis not present

## 2017-06-01 DIAGNOSIS — M5136 Other intervertebral disc degeneration, lumbar region: Secondary | ICD-10-CM | POA: Diagnosis not present

## 2017-06-01 DIAGNOSIS — D631 Anemia in chronic kidney disease: Secondary | ICD-10-CM | POA: Diagnosis not present

## 2017-06-01 DIAGNOSIS — J9601 Acute respiratory failure with hypoxia: Secondary | ICD-10-CM | POA: Diagnosis not present

## 2017-06-01 DIAGNOSIS — N189 Chronic kidney disease, unspecified: Secondary | ICD-10-CM | POA: Diagnosis not present

## 2017-06-01 MED ORDER — ATENOLOL 100 MG PO TABS: 100.0000 mg | ORAL_TABLET | Freq: Every day | ORAL | 3 refills | Status: DC

## 2017-06-01 NOTE — Patient Instructions (Signed)
It was nice to see you today, your blood pressure is excellent and at goal < 130/80  Continue taking all of your medications  Take a total of atenolol 100mg  daily - you can use up the tablets that you have. I will send in a new prescription to take 1 tablet of the 100mg  dose one time each day  Continue to monitor your blood pressure at home and call clinic with any concerns

## 2017-06-01 NOTE — Telephone Encounter (Signed)
[facility] calls and wants to inform you they saw pt this am and changed atenolol dose, please review note from today

## 2017-06-02 DIAGNOSIS — E11622 Type 2 diabetes mellitus with other skin ulcer: Secondary | ICD-10-CM | POA: Diagnosis not present

## 2017-06-02 DIAGNOSIS — E1151 Type 2 diabetes mellitus with diabetic peripheral angiopathy without gangrene: Secondary | ICD-10-CM | POA: Diagnosis not present

## 2017-06-02 DIAGNOSIS — L98499 Non-pressure chronic ulcer of skin of other sites with unspecified severity: Secondary | ICD-10-CM | POA: Diagnosis not present

## 2017-06-02 DIAGNOSIS — E1140 Type 2 diabetes mellitus with diabetic neuropathy, unspecified: Secondary | ICD-10-CM | POA: Diagnosis not present

## 2017-06-02 DIAGNOSIS — N183 Chronic kidney disease, stage 3 (moderate): Secondary | ICD-10-CM | POA: Diagnosis not present

## 2017-06-02 DIAGNOSIS — I2510 Atherosclerotic heart disease of native coronary artery without angina pectoris: Secondary | ICD-10-CM | POA: Diagnosis not present

## 2017-06-02 DIAGNOSIS — I129 Hypertensive chronic kidney disease with stage 1 through stage 4 chronic kidney disease, or unspecified chronic kidney disease: Secondary | ICD-10-CM | POA: Diagnosis not present

## 2017-06-02 DIAGNOSIS — L98492 Non-pressure chronic ulcer of skin of other sites with fat layer exposed: Secondary | ICD-10-CM | POA: Diagnosis not present

## 2017-06-02 DIAGNOSIS — E1122 Type 2 diabetes mellitus with diabetic chronic kidney disease: Secondary | ICD-10-CM | POA: Diagnosis not present

## 2017-06-03 DIAGNOSIS — Z961 Presence of intraocular lens: Secondary | ICD-10-CM | POA: Diagnosis not present

## 2017-06-03 DIAGNOSIS — E119 Type 2 diabetes mellitus without complications: Secondary | ICD-10-CM | POA: Diagnosis not present

## 2017-06-03 DIAGNOSIS — H4321 Crystalline deposits in vitreous body, right eye: Secondary | ICD-10-CM | POA: Diagnosis not present

## 2017-06-03 DIAGNOSIS — H40053 Ocular hypertension, bilateral: Secondary | ICD-10-CM | POA: Diagnosis not present

## 2017-06-03 DIAGNOSIS — H10413 Chronic giant papillary conjunctivitis, bilateral: Secondary | ICD-10-CM | POA: Diagnosis not present

## 2017-06-03 LAB — HM DIABETES EYE EXAM

## 2017-06-04 DIAGNOSIS — I5032 Chronic diastolic (congestive) heart failure: Secondary | ICD-10-CM | POA: Diagnosis not present

## 2017-06-04 DIAGNOSIS — T8089XD Other complications following infusion, transfusion and therapeutic injection, subsequent encounter: Secondary | ICD-10-CM | POA: Diagnosis not present

## 2017-06-04 DIAGNOSIS — E1140 Type 2 diabetes mellitus with diabetic neuropathy, unspecified: Secondary | ICD-10-CM | POA: Diagnosis not present

## 2017-06-04 DIAGNOSIS — E1122 Type 2 diabetes mellitus with diabetic chronic kidney disease: Secondary | ICD-10-CM | POA: Diagnosis not present

## 2017-06-04 DIAGNOSIS — D631 Anemia in chronic kidney disease: Secondary | ICD-10-CM | POA: Diagnosis not present

## 2017-06-04 DIAGNOSIS — I2510 Atherosclerotic heart disease of native coronary artery without angina pectoris: Secondary | ICD-10-CM | POA: Diagnosis not present

## 2017-06-04 DIAGNOSIS — E785 Hyperlipidemia, unspecified: Secondary | ICD-10-CM | POA: Diagnosis not present

## 2017-06-04 DIAGNOSIS — N179 Acute kidney failure, unspecified: Secondary | ICD-10-CM | POA: Diagnosis not present

## 2017-06-04 DIAGNOSIS — J9601 Acute respiratory failure with hypoxia: Secondary | ICD-10-CM | POA: Diagnosis not present

## 2017-06-04 DIAGNOSIS — N189 Chronic kidney disease, unspecified: Secondary | ICD-10-CM | POA: Diagnosis not present

## 2017-06-04 DIAGNOSIS — I130 Hypertensive heart and chronic kidney disease with heart failure and stage 1 through stage 4 chronic kidney disease, or unspecified chronic kidney disease: Secondary | ICD-10-CM | POA: Diagnosis not present

## 2017-06-04 DIAGNOSIS — M4802 Spinal stenosis, cervical region: Secondary | ICD-10-CM | POA: Diagnosis not present

## 2017-06-04 DIAGNOSIS — M5136 Other intervertebral disc degeneration, lumbar region: Secondary | ICD-10-CM | POA: Diagnosis not present

## 2017-06-07 DIAGNOSIS — T8089XD Other complications following infusion, transfusion and therapeutic injection, subsequent encounter: Secondary | ICD-10-CM | POA: Diagnosis not present

## 2017-06-07 DIAGNOSIS — I130 Hypertensive heart and chronic kidney disease with heart failure and stage 1 through stage 4 chronic kidney disease, or unspecified chronic kidney disease: Secondary | ICD-10-CM | POA: Diagnosis not present

## 2017-06-07 DIAGNOSIS — E785 Hyperlipidemia, unspecified: Secondary | ICD-10-CM | POA: Diagnosis not present

## 2017-06-07 DIAGNOSIS — D631 Anemia in chronic kidney disease: Secondary | ICD-10-CM | POA: Diagnosis not present

## 2017-06-07 DIAGNOSIS — I5032 Chronic diastolic (congestive) heart failure: Secondary | ICD-10-CM | POA: Diagnosis not present

## 2017-06-07 DIAGNOSIS — N189 Chronic kidney disease, unspecified: Secondary | ICD-10-CM | POA: Diagnosis not present

## 2017-06-07 DIAGNOSIS — E1140 Type 2 diabetes mellitus with diabetic neuropathy, unspecified: Secondary | ICD-10-CM | POA: Diagnosis not present

## 2017-06-07 DIAGNOSIS — M5136 Other intervertebral disc degeneration, lumbar region: Secondary | ICD-10-CM | POA: Diagnosis not present

## 2017-06-07 DIAGNOSIS — N179 Acute kidney failure, unspecified: Secondary | ICD-10-CM | POA: Diagnosis not present

## 2017-06-07 DIAGNOSIS — M4802 Spinal stenosis, cervical region: Secondary | ICD-10-CM | POA: Diagnosis not present

## 2017-06-07 DIAGNOSIS — J9601 Acute respiratory failure with hypoxia: Secondary | ICD-10-CM | POA: Diagnosis not present

## 2017-06-07 DIAGNOSIS — E1122 Type 2 diabetes mellitus with diabetic chronic kidney disease: Secondary | ICD-10-CM | POA: Diagnosis not present

## 2017-06-07 DIAGNOSIS — I2510 Atherosclerotic heart disease of native coronary artery without angina pectoris: Secondary | ICD-10-CM | POA: Diagnosis not present

## 2017-06-08 DIAGNOSIS — J383 Other diseases of vocal cords: Secondary | ICD-10-CM | POA: Diagnosis not present

## 2017-06-08 DIAGNOSIS — Q318 Other congenital malformations of larynx: Secondary | ICD-10-CM | POA: Diagnosis not present

## 2017-06-09 DIAGNOSIS — I2510 Atherosclerotic heart disease of native coronary artery without angina pectoris: Secondary | ICD-10-CM | POA: Diagnosis not present

## 2017-06-09 DIAGNOSIS — I129 Hypertensive chronic kidney disease with stage 1 through stage 4 chronic kidney disease, or unspecified chronic kidney disease: Secondary | ICD-10-CM | POA: Diagnosis not present

## 2017-06-09 DIAGNOSIS — E1151 Type 2 diabetes mellitus with diabetic peripheral angiopathy without gangrene: Secondary | ICD-10-CM | POA: Diagnosis not present

## 2017-06-09 DIAGNOSIS — E1122 Type 2 diabetes mellitus with diabetic chronic kidney disease: Secondary | ICD-10-CM | POA: Diagnosis not present

## 2017-06-09 DIAGNOSIS — L98499 Non-pressure chronic ulcer of skin of other sites with unspecified severity: Secondary | ICD-10-CM | POA: Diagnosis not present

## 2017-06-09 DIAGNOSIS — E11622 Type 2 diabetes mellitus with other skin ulcer: Secondary | ICD-10-CM | POA: Diagnosis not present

## 2017-06-09 DIAGNOSIS — N183 Chronic kidney disease, stage 3 (moderate): Secondary | ICD-10-CM | POA: Diagnosis not present

## 2017-06-09 DIAGNOSIS — E1140 Type 2 diabetes mellitus with diabetic neuropathy, unspecified: Secondary | ICD-10-CM | POA: Diagnosis not present

## 2017-06-10 ENCOUNTER — Other Ambulatory Visit: Payer: Self-pay | Admitting: Internal Medicine

## 2017-06-10 DIAGNOSIS — Z1231 Encounter for screening mammogram for malignant neoplasm of breast: Secondary | ICD-10-CM

## 2017-06-11 DIAGNOSIS — M5136 Other intervertebral disc degeneration, lumbar region: Secondary | ICD-10-CM | POA: Diagnosis not present

## 2017-06-11 DIAGNOSIS — M4802 Spinal stenosis, cervical region: Secondary | ICD-10-CM | POA: Diagnosis not present

## 2017-06-11 DIAGNOSIS — E785 Hyperlipidemia, unspecified: Secondary | ICD-10-CM | POA: Diagnosis not present

## 2017-06-11 DIAGNOSIS — J9601 Acute respiratory failure with hypoxia: Secondary | ICD-10-CM | POA: Diagnosis not present

## 2017-06-11 DIAGNOSIS — N189 Chronic kidney disease, unspecified: Secondary | ICD-10-CM | POA: Diagnosis not present

## 2017-06-11 DIAGNOSIS — E1122 Type 2 diabetes mellitus with diabetic chronic kidney disease: Secondary | ICD-10-CM | POA: Diagnosis not present

## 2017-06-11 DIAGNOSIS — I2510 Atherosclerotic heart disease of native coronary artery without angina pectoris: Secondary | ICD-10-CM | POA: Diagnosis not present

## 2017-06-11 DIAGNOSIS — D631 Anemia in chronic kidney disease: Secondary | ICD-10-CM | POA: Diagnosis not present

## 2017-06-11 DIAGNOSIS — I130 Hypertensive heart and chronic kidney disease with heart failure and stage 1 through stage 4 chronic kidney disease, or unspecified chronic kidney disease: Secondary | ICD-10-CM | POA: Diagnosis not present

## 2017-06-11 DIAGNOSIS — E1140 Type 2 diabetes mellitus with diabetic neuropathy, unspecified: Secondary | ICD-10-CM | POA: Diagnosis not present

## 2017-06-11 DIAGNOSIS — I5032 Chronic diastolic (congestive) heart failure: Secondary | ICD-10-CM | POA: Diagnosis not present

## 2017-06-11 DIAGNOSIS — N179 Acute kidney failure, unspecified: Secondary | ICD-10-CM | POA: Diagnosis not present

## 2017-06-11 DIAGNOSIS — T8089XD Other complications following infusion, transfusion and therapeutic injection, subsequent encounter: Secondary | ICD-10-CM | POA: Diagnosis not present

## 2017-06-15 DIAGNOSIS — J9601 Acute respiratory failure with hypoxia: Secondary | ICD-10-CM | POA: Diagnosis not present

## 2017-06-15 DIAGNOSIS — I130 Hypertensive heart and chronic kidney disease with heart failure and stage 1 through stage 4 chronic kidney disease, or unspecified chronic kidney disease: Secondary | ICD-10-CM | POA: Diagnosis not present

## 2017-06-15 DIAGNOSIS — T8089XD Other complications following infusion, transfusion and therapeutic injection, subsequent encounter: Secondary | ICD-10-CM | POA: Diagnosis not present

## 2017-06-15 DIAGNOSIS — D631 Anemia in chronic kidney disease: Secondary | ICD-10-CM | POA: Diagnosis not present

## 2017-06-15 DIAGNOSIS — M5136 Other intervertebral disc degeneration, lumbar region: Secondary | ICD-10-CM | POA: Diagnosis not present

## 2017-06-15 DIAGNOSIS — M4802 Spinal stenosis, cervical region: Secondary | ICD-10-CM | POA: Diagnosis not present

## 2017-06-15 DIAGNOSIS — E1140 Type 2 diabetes mellitus with diabetic neuropathy, unspecified: Secondary | ICD-10-CM | POA: Diagnosis not present

## 2017-06-15 DIAGNOSIS — I5032 Chronic diastolic (congestive) heart failure: Secondary | ICD-10-CM | POA: Diagnosis not present

## 2017-06-15 DIAGNOSIS — N179 Acute kidney failure, unspecified: Secondary | ICD-10-CM | POA: Diagnosis not present

## 2017-06-15 DIAGNOSIS — N189 Chronic kidney disease, unspecified: Secondary | ICD-10-CM | POA: Diagnosis not present

## 2017-06-15 DIAGNOSIS — E785 Hyperlipidemia, unspecified: Secondary | ICD-10-CM | POA: Diagnosis not present

## 2017-06-15 DIAGNOSIS — E1122 Type 2 diabetes mellitus with diabetic chronic kidney disease: Secondary | ICD-10-CM | POA: Diagnosis not present

## 2017-06-15 DIAGNOSIS — I2510 Atherosclerotic heart disease of native coronary artery without angina pectoris: Secondary | ICD-10-CM | POA: Diagnosis not present

## 2017-06-17 ENCOUNTER — Encounter: Payer: Self-pay | Admitting: Internal Medicine

## 2017-06-17 ENCOUNTER — Ambulatory Visit (INDEPENDENT_AMBULATORY_CARE_PROVIDER_SITE_OTHER): Payer: Medicare Other | Admitting: Internal Medicine

## 2017-06-17 VITALS — BP 135/65 | HR 72 | Temp 98.2°F | Ht 65.0 in | Wt 207.7 lb

## 2017-06-17 DIAGNOSIS — N183 Chronic kidney disease, stage 3 unspecified: Secondary | ICD-10-CM

## 2017-06-17 DIAGNOSIS — G894 Chronic pain syndrome: Secondary | ICD-10-CM | POA: Diagnosis not present

## 2017-06-17 DIAGNOSIS — Z23 Encounter for immunization: Secondary | ICD-10-CM | POA: Diagnosis not present

## 2017-06-17 DIAGNOSIS — IMO0002 Reserved for concepts with insufficient information to code with codable children: Secondary | ICD-10-CM

## 2017-06-17 DIAGNOSIS — E1151 Type 2 diabetes mellitus with diabetic peripheral angiopathy without gangrene: Secondary | ICD-10-CM | POA: Diagnosis not present

## 2017-06-17 DIAGNOSIS — E1122 Type 2 diabetes mellitus with diabetic chronic kidney disease: Secondary | ICD-10-CM

## 2017-06-17 DIAGNOSIS — F5101 Primary insomnia: Secondary | ICD-10-CM | POA: Diagnosis not present

## 2017-06-17 DIAGNOSIS — I700 Atherosclerosis of aorta: Secondary | ICD-10-CM | POA: Diagnosis not present

## 2017-06-17 DIAGNOSIS — E1165 Type 2 diabetes mellitus with hyperglycemia: Secondary | ICD-10-CM | POA: Diagnosis not present

## 2017-06-17 DIAGNOSIS — J383 Other diseases of vocal cords: Secondary | ICD-10-CM | POA: Diagnosis not present

## 2017-06-17 DIAGNOSIS — I129 Hypertensive chronic kidney disease with stage 1 through stage 4 chronic kidney disease, or unspecified chronic kidney disease: Secondary | ICD-10-CM

## 2017-06-17 DIAGNOSIS — I10 Essential (primary) hypertension: Secondary | ICD-10-CM

## 2017-06-17 LAB — GLUCOSE, CAPILLARY: Glucose-Capillary: 137 mg/dL — ABNORMAL HIGH (ref 65–99)

## 2017-06-17 LAB — POCT GLYCOSYLATED HEMOGLOBIN (HGB A1C): Hemoglobin A1C: 6.6

## 2017-06-17 MED ORDER — MELATONIN 1 MG PO CAPS: 0.5000 mg | ORAL_CAPSULE | Freq: Every day | ORAL | 1 refills | Status: DC

## 2017-06-17 NOTE — Progress Notes (Signed)
   Subjective:    Patient ID: [name] [name], female    DOB: 1946/01/11, 71 y.o.   MRN: [id number]  HPI  MEHR DEPAOLI is here for throat issues. Please see the A&P for the status of the pt's chronic medical problems.  ROS : per ROS section and in problem oriented charting. All other systems are negative.  PMHx, Soc hx, and / or Fam hx : Has great grandchildren. Home PT over. Ambulating without issue.   Review of Systems  Constitutional: Positive for fatigue.  HENT: Positive for rhinorrhea. Negative for congestion.   Eyes: Negative for visual disturbance.  Cardiovascular: Negative for chest pain and leg swelling.  Endocrine:       No hypoglycemia sxs  Genitourinary: Negative for difficulty urinating.       + nocturia x2  Musculoskeletal: Positive for neck pain. Negative for back pain.  Neurological: Negative for headaches.  Psychiatric/Behavioral: Positive for sleep disturbance.      Objective:   Physical Exam  Constitutional: She appears well-developed and well-nourished. No distress.  HENT:  Head: Normocephalic and atraumatic.  Right Ear: External ear normal.  Left Ear: External ear normal.  Nose: Nose normal.  Eyes: Conjunctivae and EOM are normal. Right eye exhibits no discharge. Left eye exhibits no discharge. No scleral icterus.  Cardiovascular: Normal rate and regular rhythm.   Murmur heard. 2/6 systolic murmur I could not appreciate carotid murmur today  Musculoskeletal: She exhibits edema. She exhibits no tenderness.  Neurological: She is alert.  Skin: Skin is warm and dry. She is not diaphoretic.  Psychiatric: She has a normal mood and affect. Her behavior is normal. Judgment and thought content normal.      Assessment & Plan:

## 2017-06-17 NOTE — Assessment & Plan Note (Signed)
This problem is chronic and stable. She continues to use the tramadol at bedtime for her neck pain. There has been no red flags or orange flag behavior.  PLAN:  Cont current meds

## 2017-06-17 NOTE — Patient Instructions (Addendum)
1. Try melatonin for sleep. 2. Call me in 2 weeks if no better 3. Let me know if your sugar ever gets too low 4. Your sugar is excellent!

## 2017-06-17 NOTE — Assessment & Plan Note (Signed)
This problem is chronic and stable. She had acute kidney injury and CVVH requirements while in the hospital. Her renal function has now normalized and most recently was 1.0 with a GFR of 48. She does not see renal again for another year. I will continue to hold her ACE inhibitor as her blood pressure is well controlled and she has no microalbuminuria.  PLAN : follow Cr

## 2017-06-17 NOTE — Assessment & Plan Note (Signed)
This problem is chronic and improved. She is on atenolol 100 and amlodipine 5 mg. Her microalbumin level has been normal. She is no longer on an ACE inhibitor due to the acute kidney failure with CVVH requirements earlier this year, she was in the ICU. She is having no side effects to the atenolol or amlodipine and her heart rate is in an appropriate level.  PLAN:  Cont current meds    BP Readings from Last 3 Encounters:  06/17/17 135/65  06/01/17 118/62  05/28/17 124/78

## 2017-06-17 NOTE — Assessment & Plan Note (Signed)
This was an incidental finding on CT imaging earlier this year. She is asymptomatic. We are using risk factor management with a statin and aspirin. Her diabetes and blood pressure are under excellent control.  PLAN:  Cont current meds

## 2017-06-17 NOTE — Assessment & Plan Note (Addendum)
This problem is chronic and stable. Her A1c trend has been 6.6 - 7 - 6.7 - 6.6. She is on Lantus 12 units and Victoza. She denies any hypoglycemic symptoms. I have been concerned for the past few visits that her A1c is too low. However she denies hypoglycemia and there is no hypoglycemia on her CBG log. Therefore, I will leave her medicines as is and decrease if she gets any hypoglycemia. She is up-to-date on her diabetic metrics although her eye exam is not yet up in the system.  PLAN:  Cont current meds

## 2017-06-17 NOTE — Assessment & Plan Note (Signed)
This problem is chronic and uncontrolled. She gave me very similar history today as she did at her last appointment. She lays down about 7:30 but states that she does not fall asleep until 6 AM. She sleeps until 7:30 AM and gets up. She denies napping during the day but states that she will close her eyes. She does feel sleepy when she lays down in the evening but just cannot fall asleep. She states her home is quite and couple and would be conducive to sleeping if she could fall asleep. She is fatigued during the day and this is bothering her. She used to be on Ambien but it was stopped when she was discharged from the hospital.  We discussed that we would try other therapies before resorting back to the Ambien.  PLAN : Melatonin 0.5 mg at bedtime

## 2017-06-17 NOTE — Assessment & Plan Note (Signed)
This problem is chronic and stable. I have reviewed the ENT notes. 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 She has been referred to speech therapy for a trial of voice therapy and then will see ENT again. Today, she states that her symptoms are all right and they come and go.  PLAN : Follow up at ENTs recommendations

## 2017-07-01 ENCOUNTER — Telehealth: Payer: Self-pay | Admitting: *Deleted

## 2017-07-01 NOTE — Telephone Encounter (Signed)
Pt's daughter calls and states she was to have some sleeping pills called in by dr Software engineer and there hasnt been any at the pharm, explained to her the name and amt and you buy them over the counter. I spelled out the name, dose and then informed her I would call [facility] and have the pharmacist set them at the counter for her, called spoke to [facility] and he will do so.

## 2017-07-06 ENCOUNTER — Other Ambulatory Visit: Payer: Self-pay | Admitting: Internal Medicine

## 2017-07-06 ENCOUNTER — Ambulatory Visit
Admission: RE | Admit: 2017-07-06 | Discharge: 2017-07-06 | Disposition: A | Discharge status: Home / Self Care | Payer: Medicare Other | Source: Ambulatory Visit | Attending: Internal Medicine | Admitting: Internal Medicine

## 2017-07-06 DIAGNOSIS — Z1231 Encounter for screening mammogram for malignant neoplasm of breast: Secondary | ICD-10-CM | POA: Diagnosis not present

## 2017-07-13 ENCOUNTER — Ambulatory Visit: Payer: Medicare Other | Attending: Family Medicine | Admitting: Speech Pathology

## 2017-07-13 DIAGNOSIS — R498 Other voice and resonance disorders: Secondary | ICD-10-CM | POA: Insufficient documentation

## 2017-07-13 NOTE — Therapy (Signed)
[facility] [address] [place], [place], [address] Phone: [phone]   Fax:  [phone]  Speech Language Pathology Evaluation  Patient Details  Name: [name] [name] MRN: [id number] Date of Birth: [birth year] No Data Recorded  Encounter Date: 07/13/2017      End of Session - 07/13/17 1131    Visit Number 1   Number of Visits 17   Date for SLP Re-Evaluation 09/10/17   Authorization Type UHC MCR no auth no limits per insurance notes   SLP Start Time 1027   SLP Stop Time  1103   SLP Time Calculation (min) 36 min   Activity Tolerance Patient tolerated treatment well      Past Medical History:  Diagnosis Date  . Anemia   . Arthritis   . CAD 08/26/2006   Cath 07/05:multiple areas of nonobstructive disease = medical & RF mgmt, well preserved global systolic function. Dr. Lia Foyer. Nuclear med stress test 01/2014 : Normal stress nuclear study. LV Ejection Fraction: 76%. LV Wall Motion: NL LV Function; NL Wall Motion.      . Chronic pain syndrome 01/03/2013   Multifactorial. Non opioid requiring.   L2-5 fusion, with hardware at L2-3, stable per MRI 2010. Followed by neurosurgery (Dr. 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    [name] DEPRESSION 08/26/2006   On paxil    . DIABETES MELLITUS, TYPE II 08/26/2006   Insulin dependent. Victoza added 03/2014  . DYSLIPIDEMIA 11/01/2006   LDL goal < 100, 70 if can achieve without side effects.     . Gallstones   . GERD 08/26/2006   Heartburn QHS.     [name] HYPERTENSION 08/26/2006   On BB, ACEI, lasix, norvasc, metolazone, and imdur.     . OBESITY 08/26/2006   BMI 39.5. Obesity Class 2.     . PVD (peripheral vascular disease) ([place]) 03/27/2014   2015 LE arterial Duplex - Possible mild inflow disease on the left. 0-49% bilateral SFA disease, without focal stenosis. Three vesel run-off, bilaterally. Medical tx.     Past  Surgical History:  Procedure Laterality Date  . CHOLECYSTECTOMY     pt denies 04/07/16  . endometrial biospy    . LEFT HEART CATH    . lumbar fusion surgery  10/08   L3-L5  . posterior lumbar interfusion surgery  07/18/07   L2-L3  . rotator cuff surgery Bilateral     There were no vitals filed for this visit.      Subjective Assessment - 07/13/17 1149    Subjective "I have a point in my throat that gets me if I try to talk loud or get up phlegm" - Pt arrived 15 minutes late to eval            SLP Evaluation [facility] - 07/13/17 1132      General Information   HPI Vocal fatigue- worse with talking on the phone. Never completely loses her voice. Alleviating factors include resting her voice. Struggles with projection, with voice breaks. Feels anterior neck muscles strain with talking. Change in quality of her voice, feels it is hoarse. Denies neck masses, otalgia, dysphagia. She is not a singer.   Findings: The nasal cavity and nasopharynx are unremarkable. There are no suspicious findings in the nasopharynx or Fossa of Rosenmller. The tongue base, pharyngeal walls, piriform sinuses, vallecula, epiglottis and postcricoid region are normal in appearance. The visualized portion of the subglottis and  proximal trachea is widely patent. The vocal folds are mobile bilaterally. There are no lesions or significant inflammation. There is no glottal insufficiency. There is no pooling of secretions or aspiration. There is persistent glottic gap in all phases of phonation. Additionally, on the left posterior arytenoid, there is a small, erythematous nodule. The is somewhat sessile. Small compensatory irritation on the right posterior arytenoid, but this is not in the typical location of vocal nodules. 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 This lesion is surrounded by adenoid tissue and is in the usual location for these lesions.                           SLP Education - 07/13/17 1130    Education provided Yes   Education Details voice conservation handout (network down at time of eval); throat clear alternatives, goals and rationale for ST, vocal nodule vs vocal fold atrophy   Person(s) Educated Patient;Child(ren)   Methods Explanation;Demonstration;Verbal cues;Handout   Comprehension Verbalized understanding;Returned demonstration;Verbal cues required;Need further instruction          SLP Short Term Goals - 07/13/17 1150      SLP SHORT TERM GOAL #1   Title Pt will follow 4 vocal hygiene strategies with occasional min A from family/ST over 4 sessions per pt and family report   Time 4   Period Weeks   Status New     SLP SHORT TERM GOAL #2   Title Pt will utilize abdominal breathing at sentence level 10/12 sentence response with occasional min A   Time 4   Period Weeks   Status New     SLP SHORT TERM GOAL #3   Title Pt will utilize confidential voice during structured speech tasks and simple conversation with occasional min A 80% of session   Time 4   Period Weeks   Status New          SLP Long Term Goals - 07/13/17 1205      SLP LONG TERM GOAL #1   Title Pt will follow 6 vocal hygien strategies with occasional min A from family over 4 sessions per pt/family report   Time 8   Period Weeks   Status New     SLP LONG TERM GOAL #2   Title Pt will perform HEP for gentle vocal fold adduction/phonation with occasional min A over 3 sessions   Time 8   Period Weeks   Status New     SLP LONG TERM GOAL #3   Title Pt will utilize abdominal breathing and confidential voice over 15 minute conversation with occasional min A   Time 8   Period Weeks   Status New     SLP LONG TERM GOAL #4   Title Pt will demonstrate WNL phonation over 12 minute conversation with rare min A over 2 sessions.    Time 8   Period Weeks   Status New          Plan - 07/13/17 1158    Clinical Impression Statement [name] [name], a 71 y.o. 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 See ENT results in HPI. Pt is accompanied by her daughter. She reports her voice is normal early in the morning, but becomes hoarse after she starts talking. At home, she is frequently with her great grandchildren, and reports  talking over their toys and yelling to them. She als affirms that she does make funny voices to entertain her grandchildren. She feels her hoarse voice started after hospitalization in May for kidney failure, requiring intubation for 2 days. [name] [name] reports difficulty talking on the phone to family due to her voice and that people occasionally ask her to repeat herself.  [name] [name] initially presents with [facility] phonation, however after a few minutes of talking with me, dysphonia occurred. Pt was very aware when her voice changed. Average sustained /a/ was 14.12 seconds, slightly below WNL of 15 to 20 seconds. s:z ration was 1.1 (WNL). In quiet therapy room, pt was 100% intellgible, however dyphonia was distracting.  I recommend skilled ST to  maximize intellgiblity, reduce vocal nodule, train pt in vocal hygience as well as strengthen vocal folds for improved phonation. Today, pt was trained in vocal hygiene, includidng condifdential voice, reducing habitual cough/ throat clearing (pt reports frequently "hocking" up phlgem and coughing) and environmental modificaations to support healthy voice.       Patient will benefit from skilled therapeutic intervention in order to improve the following deficits and impairments:   Other voice and resonance disorders - Plan: SLP plan of care cert/re-cert      G-Codes - 46/50/35 1133    Functional Limitations Voice   Voice Current Status (G9171) At least 40 percent but less than 60 percent impaired, limited or restricted   Voice Goal Status (W6568) At least 20 percent but less than 40 percent impaired, limited  or restricted      Problem List Patient Active Problem List   Diagnosis Date Noted  . Aortic atherosclerosis ([place]) 06/17/2017  . 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 Right carotid bruit 04/08/2017  . CKD stage 3 due to type 2 diabetes mellitus ([place]) 12/11/2016  . Diabetic neuropathy ([place]) 12/10/2016  . Small intestinal bacterial overgrowth 12/24/2015  . Goals of care, counseling/discussion 01/18/2015  . ASCUS with positive high risk HPV 08/03/2014  . PVD (peripheral vascular disease) ([place]) 03/27/2014  . Diastolic CHF, chronic ([place]) 12/21/2013  . Healthcare maintenance 04/25/2013  . Chronic pain syndrome 01/03/2013  . Insomnia 12/22/2011  . Vitamin B12 deficiency 12/27/2009  . Vitamin D deficiency 12/27/2009  . Anemia 12/27/2009  . Hypomagnesemia 12/29/2008  . Osteopenia 08/01/2008  . Hyperlipidemia 11/01/2006  . GLAUCOMA NOS 11/01/2006  . DM (diabetes mellitus), type 2, uncontrolled, periph vascular complic ([place]) 12/75/1700  . Obesity 08/26/2006  . Depression 08/26/2006  . HTN (hypertension) 08/26/2006  . Coronary atherosclerosis 08/26/2006  . GERD 08/26/2006    Tristan Bramble, Annye Rusk MS, CCC-SLP 07/13/2017, 12:11 PM  [facility] [address] [place], [place], [address] Phone: [phone]   Fax:  [phone]  Name: [name] [name] MRN: [id number] Date of Birth: 02-13-1946

## 2017-07-14 ENCOUNTER — Other Ambulatory Visit: Payer: Self-pay | Admitting: Cardiology

## 2017-07-21 ENCOUNTER — Ambulatory Visit: Payer: Medicare Other | Admitting: Speech Pathology

## 2017-07-21 DIAGNOSIS — R498 Other voice and resonance disorders: Secondary | ICD-10-CM | POA: Diagnosis not present

## 2017-07-21 NOTE — Patient Instructions (Signed)
  VOCAL FOLD ADDUCTION EXERCISES  Breathe before each excercise  Yawn-Sigh  Take an easy breath through your mouth while yawning gently. When you breathe out, say an easy, prolonged "AH" 10x, 3x a day  1.  PUSH Bear down against a chair while saying /a/ 5 times, with a lot of effort  2.  PULL up of the seat of a chair with both hands while saying /a/ 5 times with a lot of   Effort  3. Take a breath, hold it for 3 seconds with your mouth open, then release it with a loud exhale. Think of an army or football "HUH." You are using your voice box to hold in your breath.  4. Glide your pitch high and low (as low and high as possible) "Knoll"  5. Glide your pitch low to high "Knoll"  6. Gentle cough/throat clear, then continue voicing for 3-5 seconds  7. Hold your breath, swallow hard, then immediately cough   Do these 10 times each, 3 times a day  Provided by: [name] [name] ST, [phone] \

## 2017-07-21 NOTE — Therapy (Signed)
[facility] [address] [place], [place], [address] Phone: [phone]   Fax:  [phone]  Speech Language Pathology Treatment  Patient Details  Name: [name] [name] MRN: [id number] Date of Birth: [birth year] No Data Recorded  Encounter Date: 07/21/2017      End of Session - 07/21/17 1100    Visit Number 2   Number of Visits 17   Date for SLP Re-Evaluation 09/10/17   Authorization Type UHC MCR no auth no limits per insurance notes   SLP Start Time 1017   SLP Stop Time  1057   SLP Time Calculation (min) 40 min   Activity Tolerance Patient tolerated treatment well      Past Medical History:  Diagnosis Date  . Anemia   . Arthritis   . CAD 08/26/2006   Cath 07/05:multiple areas of nonobstructive disease = medical & RF mgmt, well preserved global systolic function. Dr. Lia Foyer. Nuclear med stress test 01/2014 : Normal stress nuclear study. LV Ejection Fraction: 76%. LV Wall Motion: NL LV Function; NL Wall Motion.      . Chronic pain syndrome 01/03/2013   Multifactorial. Non opioid requiring.   L2-5 fusion, with hardware at L2-3, stable per MRI 2010. Followed by neurosurgery (Dr. 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    [name] DEPRESSION 08/26/2006   On paxil    . DIABETES MELLITUS, TYPE II 08/26/2006   Insulin dependent. Victoza added 03/2014  . DYSLIPIDEMIA 11/01/2006   LDL goal < 100, 70 if can achieve without side effects.     . Gallstones   . GERD 08/26/2006   Heartburn QHS.     [name] HYPERTENSION 08/26/2006   On BB, ACEI, lasix, norvasc, metolazone, and imdur.     . OBESITY 08/26/2006   BMI 39.5. Obesity Class 2.     . PVD (peripheral vascular disease) ([place]) 03/27/2014   2015 LE arterial Duplex - Possible mild inflow disease on the left. 0-49% bilateral SFA disease, without focal stenosis. Three vesel run-off, bilaterally. Medical tx.     Past  Surgical History:  Procedure Laterality Date  . CHOLECYSTECTOMY     pt denies 04/07/16  . endometrial biospy    . LEFT HEART CATH    . lumbar fusion surgery  10/08   L3-L5  . posterior lumbar interfusion surgery  07/18/07   L2-L3  . rotator cuff surgery Bilateral     There were no vitals filed for this visit.      Subjective Assessment - 07/21/17 1021    Subjective "I've been talking better"               ADULT SLP TREATMENT - 07/21/17 1021      General Information   Behavior/Cognition Alert;Cooperative;Pleasant mood     Treatment Provided   Treatment provided Cognitive-Linquistic     Pain Assessment   Pain Assessment No/denies pain     Cognitive-Linquistic Treatment   Treatment focused on Voice   Skilled Treatment Re- educated pt on vocal hygiene, pt reported that she has been trying to limit her talking talking in a quiet environment. Focus this week on total voice rest after conversations or phone conversations. Initiated training in HEP for gentle vocal fold adduction with occasoinal min to mod modeling and instruction. Pt verbalized vocal hygiene strategies with usual min questioning cues.      Assessment / Recommendations / Plan   Plan Continue with current  plan of care     Progression Toward Goals   Progression toward goals Progressing toward goals          SLP Education - 07/21/17 1053    Education provided Yes   Education Details HEP for voice, continue vocal hygiene   Person(s) Educated Patient   Methods Explanation;Demonstration;Verbal cues;Handout   Comprehension Verbalized understanding;Returned demonstration;Verbal cues required;Need further instruction          SLP Short Term Goals - 07/21/17 1100      SLP SHORT TERM GOAL #1   Title Pt will follow 4 vocal hygiene strategies with occasional min A from family/ST over 4 sessions per pt and family report   Time 4   Period Weeks   Status On-going     SLP SHORT TERM GOAL #2   Title Pt  will utilize abdominal breathing at sentence level 10/12 sentence response with occasional min A   Time 4   Period Weeks   Status On-going     SLP SHORT TERM GOAL #3   Title Pt will utilize confidential voice during structured speech tasks and simple conversation with occasional min A 80% of session   Time 4   Period Weeks   Status On-going          SLP Long Term Goals - 07/21/17 1100      SLP LONG TERM GOAL #1   Title Pt will follow 6 vocal hygien strategies with occasional min A from family over 4 sessions per pt/family report   Time 8   Period Weeks   Status On-going     SLP LONG TERM GOAL #2   Title Pt will perform HEP for gentle vocal fold adduction/phonation with occasional min A over 3 sessions   Time 8   Period Weeks   Status On-going     SLP LONG TERM GOAL #3   Title Pt will utilize abdominal breathing and confidential voice over 15 minute conversation with occasional min A   Time 8   Period Weeks   Status On-going     SLP LONG TERM GOAL #4   Title Pt will demonstrate WNL phonation over 12 minute conversation with rare min A over 2 sessions.    Time 8   Period Weeks   Status On-going          Plan - 07/21/17 1054    Clinical Impression Statement Pt reports carry over of limiting excessive talking and talking mostlly in quient environments. Today, we initated complete vocal rest for the amount of time equal to how long she spoke in conversations including over the phone. Initiated training in gentle vocal fold adduction exercises with occasional min to mod instruction and modeling. Trained pt in breath support for speech with usual mod A to coordinate good inspiration and phonation. With adequate breath support, phonation clear for 3-5 word phrases. Continue skilled ST to maximize intelligilbity and phonation.    Speech Therapy Frequency 2x / week   Treatment/Interventions SLP instruction and feedback;Internal/external aids;Compensatory strategies;Environmental  controls;Patient/family education;Cueing hierarchy;Functional tasks   Potential to Achieve Goals Good   SLP Home Exercise Plan See pt instructions, HEP for vocal fold adduction (gentle)   Consulted and Agree with Plan of Care Patient      Patient will benefit from skilled therapeutic intervention in order to improve the following deficits and impairments:   Other voice and resonance disorders    Problem List Patient Active Problem List   Diagnosis Date Noted  .  Aortic atherosclerosis ([place]) 06/17/2017  . 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 Right carotid bruit 04/08/2017  . CKD stage 3 due to type 2 diabetes mellitus ([place]) 12/11/2016  . Diabetic neuropathy ([place]) 12/10/2016  . Small intestinal bacterial overgrowth 12/24/2015  . Goals of care, counseling/discussion 01/18/2015  . ASCUS with positive high risk HPV 08/03/2014  . PVD (peripheral vascular disease) ([place]) 03/27/2014  . Diastolic CHF, chronic ([place]) 12/21/2013  . Healthcare maintenance 04/25/2013  . Chronic pain syndrome 01/03/2013  . Insomnia 12/22/2011  . Vitamin B12 deficiency 12/27/2009  . Vitamin D deficiency 12/27/2009  . Anemia 12/27/2009  . Hypomagnesemia 12/29/2008  . Osteopenia 08/01/2008  . Hyperlipidemia 11/01/2006  . GLAUCOMA NOS 11/01/2006  . DM (diabetes mellitus), type 2, uncontrolled, periph vascular complic ([place]) 97/74/1423  . Obesity 08/26/2006  . Depression 08/26/2006  . HTN (hypertension) 08/26/2006  . Coronary atherosclerosis 08/26/2006  . GERD 08/26/2006    Lovvorn, Annye Rusk MS, CCC-SLP 07/21/2017, 11:01 AM  [facility] [address] [place], [place], [address] Phone: [phone]   Fax:  [phone]   Name: [name] [name] MRN: [id number] Date of Birth: October 03, 1946

## 2017-07-22 ENCOUNTER — Ambulatory Visit: Payer: Medicare Other | Admitting: Speech Pathology

## 2017-07-23 DIAGNOSIS — J383 Other diseases of vocal cords: Secondary | ICD-10-CM | POA: Diagnosis not present

## 2017-07-23 DIAGNOSIS — J392 Other diseases of pharynx: Secondary | ICD-10-CM | POA: Diagnosis not present

## 2017-07-23 DIAGNOSIS — K219 Gastro-esophageal reflux disease without esophagitis: Secondary | ICD-10-CM | POA: Diagnosis not present

## 2017-07-23 DIAGNOSIS — H938X3 Other specified disorders of ear, bilateral: Secondary | ICD-10-CM | POA: Diagnosis not present

## 2017-07-26 ENCOUNTER — Other Ambulatory Visit: Payer: Self-pay | Admitting: Sports Medicine

## 2017-08-04 ENCOUNTER — Ambulatory Visit: Payer: Medicare Other

## 2017-08-04 DIAGNOSIS — R498 Other voice and resonance disorders: Secondary | ICD-10-CM

## 2017-08-04 NOTE — Therapy (Signed)
[facility] [address] [place], [place], [address] Phone: [phone]   Fax:  [phone]  Speech Language Pathology Treatment  Patient Details  Name: [name] [name] MRN: [id number] Date of Birth: 03-Aug-1946 No Data Recorded  Encounter Date: 08/04/2017      End of Session - 08/04/17 1425    Visit Number 3   Number of Visits 17   Date for SLP Re-Evaluation 09/10/17   SLP Start Time 24   SLP Stop Time  1100   SLP Time Calculation (min) 40 min   Activity Tolerance Patient tolerated treatment well      Past Medical History:  Diagnosis Date  . Anemia   . Arthritis   . CAD 08/26/2006   Cath 07/05:multiple areas of nonobstructive disease = medical & RF mgmt, well preserved global systolic function. Dr. Lia Foyer. Nuclear med stress test 01/2014 : Normal stress nuclear study. LV Ejection Fraction: 76%. LV Wall Motion: NL LV Function; NL Wall Motion.      . Chronic pain syndrome 01/03/2013   Multifactorial. Non opioid requiring.   L2-5 fusion, with hardware at L2-3, stable per MRI 2010. Followed by neurosurgery (Dr. 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    [name] DEPRESSION 08/26/2006   On paxil    . DIABETES MELLITUS, TYPE II 08/26/2006   Insulin dependent. Victoza added 03/2014  . DYSLIPIDEMIA 11/01/2006   LDL goal < 100, 70 if can achieve without side effects.     . Gallstones   . GERD 08/26/2006   Heartburn QHS.     [name] HYPERTENSION 08/26/2006   On BB, ACEI, lasix, norvasc, metolazone, and imdur.     . OBESITY 08/26/2006   BMI 39.5. Obesity Class 2.     . PVD (peripheral vascular disease) ([place]) 03/27/2014   2015 LE arterial Duplex - Possible mild inflow disease on the left. 0-49% bilateral SFA disease, without focal stenosis. Three vesel run-off, bilaterally. Medical tx.     Past Surgical History:  Procedure Laterality Date  . CHOLECYSTECTOMY     pt  denies 04/07/16  . endometrial biospy    . LEFT HEART CATH    . lumbar fusion surgery  10/08   L3-L5  . posterior lumbar interfusion surgery  07/18/07   L2-L3  . rotator cuff surgery Bilateral     There were no vitals filed for this visit.      Subjective Assessment - 08/04/17 1042    Subjective "We been working on taking a rest from talking." (vocal hygiene)   Currently in Pain? Yes   Pain Score 6    Pain Location Neck   Pain Orientation Right   Pain Descriptors / Indicators Sore;Aching;Constant   Pain Type Chronic pain   Pain Onset More than a month ago   Pain Frequency Intermittent   Aggravating Factors  holding her head back   Pain Relieving Factors relaxing               ADULT SLP TREATMENT - 08/04/17 1044      General Information   Behavior/Cognition Alert;Cooperative;Pleasant mood     Treatment Provided   Treatment provided Cognitive-Linquistic     Cognitive-Linquistic Treatment   Treatment focused on Voice;Patient/family/caregiver education   Skilled Treatment Pt stated she cont to try to rest her voices as much as she uses it (e.g., will rest for 15 minutes if she talks on phone for 15 minutes).  SLP targeted abdominal breathing (AB) and breath support today as SLP ID'd pt talking on residual volume. In diagnostic therapy SLP also observed pt to take shallow breaths upon phonation. SLP educated pt and drilled with abdominal breathing for 4-5 seconds inhalation through nose, exhale through mouth for 5-7 seconds; pt req'd mod cues to extend inhalation and exhalation to desired lengths. Pt improved with this over the 7 minutes of practice. In phrase/sentence tasks pt able to use AB but initially with very breathy voice and cues needed to use abdominal musculature for proper breath support, however after approx 5 reps pt began using WNL voicing.     Assessment / Recommendations / Plan   Plan Continue with current plan of care     Progression Toward Goals    Progression toward goals Progressing toward goals          SLP Education - 08/04/17 1425    Education provided Yes   Education Details Practice for abdominal breathing   Person(s) Educated Patient   Methods Explanation   Comprehension Verbalized understanding;Returned demonstration;Verbal cues required          SLP Short Term Goals - 08/04/17 1427      SLP SHORT TERM GOAL #1   Title Pt will follow 4 vocal hygiene strategies with occasional min A from family/ST over 4 sessions per pt and family report   Time 3   Period Weeks   Status On-going     SLP SHORT TERM GOAL #2   Title Pt will utilize abdominal breathing at sentence level 10/12 sentence response with occasional min A   Time 3   Period Weeks   Status On-going     SLP SHORT TERM GOAL #3   Title Pt will utilize confidential voice during structured speech tasks and simple conversation with occasional min A 80% of session   Time 3   Period Weeks   Status On-going          SLP Long Term Goals - 08/04/17 1427      SLP LONG TERM GOAL #1   Title Pt will follow 6 vocal hygien strategies with occasional min A from family over 4 sessions per pt/family report   Time 7   Period Weeks   Status On-going     SLP LONG TERM GOAL #2   Title Pt will perform HEP for gentle vocal fold adduction/phonation with occasional min A over 3 sessions   Time 7   Period Weeks   Status On-going     SLP LONG TERM GOAL #3   Title Pt will utilize abdominal breathing and confidential voice over 15 minute conversation with occasional min A   Time 7   Period Weeks   Status On-going     SLP LONG TERM GOAL #4   Title Pt will demonstrate WNL phonation over 12 minute conversation with rare min A over 2 sessions.    Time 7   Period Weeks   Status On-going          Plan - 08/04/17 1425    Clinical Impression Statement Pt continues to report use of vocally hygenic behaviors. SLP req'd to cue pt for abdominal breathing (AB) however by  session end pt was able to incorporate into phrase/sentence responses. Continue skilled ST to maximize intelligilbity and phonation.    Speech Therapy Frequency 2x / week   Treatment/Interventions SLP instruction and feedback;Internal/external aids;Compensatory strategies;Environmental controls;Patient/family education;Cueing hierarchy;Functional tasks   Potential to Achieve Goals Good  SLP Home Exercise Plan See pt instructions, HEP for vocal fold adduction (gentle)   Consulted and Agree with Plan of Care Patient      Patient will benefit from skilled therapeutic intervention in order to improve the following deficits and impairments:   Other voice and resonance disorders    Problem List Patient Active Problem List   Diagnosis Date Noted  . Aortic atherosclerosis ([place]) 06/17/2017  . 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 Right carotid bruit 04/08/2017  . CKD stage 3 due to type 2 diabetes mellitus ([place]) 12/11/2016  . Diabetic neuropathy ([place]) 12/10/2016  . Small intestinal bacterial overgrowth 12/24/2015  . Goals of care, counseling/discussion 01/18/2015  . ASCUS with positive high risk HPV 08/03/2014  . PVD (peripheral vascular disease) ([place]) 03/27/2014  . Diastolic CHF, chronic ([place]) 12/21/2013  . Healthcare maintenance 04/25/2013  . Chronic pain syndrome 01/03/2013  . Insomnia 12/22/2011  . Vitamin B12 deficiency 12/27/2009  . Vitamin D deficiency 12/27/2009  . Anemia 12/27/2009  . Hypomagnesemia 12/29/2008  . Osteopenia 08/01/2008  . Hyperlipidemia 11/01/2006  . GLAUCOMA NOS 11/01/2006  . DM (diabetes mellitus), type 2, uncontrolled, periph vascular complic ([place]) 32/11/3341  . Obesity 08/26/2006  . Depression 08/26/2006  . HTN (hypertension) 08/26/2006  . Coronary atherosclerosis 08/26/2006  . GERD 08/26/2006    [facility] ,[place], [place]  08/04/2017, 2:28 PM  [facility] [address] [place], [place], [address] Phone: [phone]   Fax:  [phone]   Name: [name] [name] MRN: [id number] Date of Birth: 1945/11/29

## 2017-08-04 NOTE — Patient Instructions (Signed)
  Pt is illiterate, so SLP reviewed these with pt:  1) practice abdominal breathing for 15 minutes twice a day (breath in 4 seconds and then out)  2) practice using abdominal breathing and using strong voice with sentence responses 15 minutes, twice a day.  Pt correctly told these to SLP twice during the session.

## 2017-08-05 ENCOUNTER — Ambulatory Visit: Payer: Medicare Other | Admitting: Speech Pathology

## 2017-08-05 DIAGNOSIS — R498 Other voice and resonance disorders: Secondary | ICD-10-CM | POA: Diagnosis not present

## 2017-08-05 NOTE — Therapy (Signed)
[facility] [address] [place], [place], [address] Phone: [phone]   Fax:  [phone]  Speech Language Pathology Treatment  Patient Details  Name: [name] [name] MRN: [id number] Date of Birth: 1945-11-20 No Data Recorded  Encounter Date: 08/05/2017      End of Session - 08/05/17 1312    Visit Number 4   Number of Visits 17   Date for SLP Re-Evaluation 09/10/17   SLP Start Time 20   SLP Stop Time  1145   SLP Time Calculation (min) 43 min   Activity Tolerance Patient tolerated treatment well      Past Medical History:  Diagnosis Date  . Anemia   . Arthritis   . CAD 08/26/2006   Cath 07/05:multiple areas of nonobstructive disease = medical & RF mgmt, well preserved global systolic function. Dr. Lia Foyer. Nuclear med stress test 01/2014 : Normal stress nuclear study. LV Ejection Fraction: 76%. LV Wall Motion: NL LV Function; NL Wall Motion.      . Chronic pain syndrome 01/03/2013   Multifactorial. Non opioid requiring.   L2-5 fusion, with hardware at L2-3, stable per MRI 2010. Followed by neurosurgery (Dr. 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    [name] DEPRESSION 08/26/2006   On paxil    . DIABETES MELLITUS, TYPE II 08/26/2006   Insulin dependent. Victoza added 03/2014  . DYSLIPIDEMIA 11/01/2006   LDL goal < 100, 70 if can achieve without side effects.     . Gallstones   . GERD 08/26/2006   Heartburn QHS.     [name] HYPERTENSION 08/26/2006   On BB, ACEI, lasix, norvasc, metolazone, and imdur.     . OBESITY 08/26/2006   BMI 39.5. Obesity Class 2.     . PVD (peripheral vascular disease) ([place]) 03/27/2014   2015 LE arterial Duplex - Possible mild inflow disease on the left. 0-49% bilateral SFA disease, without focal stenosis. Three vesel run-off, bilaterally. Medical tx.     Past Surgical History:  Procedure Laterality Date  . CHOLECYSTECTOMY     pt  denies 04/07/16  . endometrial biospy    . LEFT HEART CATH    . lumbar fusion surgery  10/08   L3-L5  . posterior lumbar interfusion surgery  07/18/07   L2-L3  . rotator cuff surgery Bilateral     There were no vitals filed for this visit.      Subjective Assessment - 08/05/17 1106    Subjective "He had had me breathing in and out." Pt reports practicing abdominal breathing at home last night.   Currently in Pain? No/denies               ADULT SLP TREATMENT - 08/05/17 1106      General Information   Behavior/Cognition Alert;Cooperative;Pleasant mood   Patient Positioning Upright in chair     Treatment Provided   Treatment provided Cognitive-Linquistic     Pain Assessment   Pain Assessment No/denies pain     Cognitive-Linquistic Treatment   Treatment focused on Voice;Patient/family/caregiver education   Skilled Treatment Pt told SLP strategies she has been working on in therapy to improve her vocal quality. SLP reviewed pt's HEP for vocal fold adduction. Pt required occasional mod A for technique (abdominal breathing, breath/phonation coordination). Extended focus on AB into spontaneous single word and phrase level responses; pt demonstrated self-monitoring/self-correction in 75% of opportunities. Structured simple conversation with 1-2 sentence responses with 60%  accuracy for AB. Pt did demonstrate awareness of speaking on residual volume in 3 instances.      Assessment / Recommendations / Plan   Plan Continue with current plan of care     Progression Toward Goals   Progression toward goals Progressing toward goals          SLP Education - 08/05/17 1312    Education provided Yes   Education Details More frequent breaths during longer responses   Person(s) Educated Patient   Methods Explanation   Comprehension Verbalized understanding;Returned demonstration;Need further instruction          SLP Short Term Goals - 08/05/17 1313      SLP SHORT TERM GOAL  #1   Title Pt will follow 4 vocal hygiene strategies with occasional min A from family/ST over 4 sessions per pt and family report   Time 3   Period Weeks   Status On-going     SLP SHORT TERM GOAL #2   Title Pt will utilize abdominal breathing at sentence level 10/12 sentence response with occasional min A   Time 3   Period Weeks   Status On-going     SLP SHORT TERM GOAL #3   Title Pt will utilize confidential voice during structured speech tasks and simple conversation with occasional min A 80% of session   Time 3   Period Weeks          SLP Long Term Goals - 08/05/17 1313      SLP LONG TERM GOAL #1   Title Pt will follow 6 vocal hygien strategies with occasional min A from family over 4 sessions per pt/family report   Time 7   Period Weeks   Status On-going     SLP LONG TERM GOAL #2   Title Pt will perform HEP for gentle vocal fold adduction/phonation with occasional min A over 3 sessions   Time 7   Period Weeks   Status On-going     SLP LONG TERM GOAL #3   Title Pt will utilize abdominal breathing and confidential voice over 15 minute conversation with occasional min A   Time 7   Period Weeks   Status On-going     SLP LONG TERM GOAL #4   Title Pt will demonstrate WNL phonation over 12 minute conversation with rare min A over 2 sessions.    Time 7   Period Weeks   Status On-going          Plan - 08/05/17 1313    Clinical Impression Statement Pt continues to report use of vocally hygenic behaviors. SLP req'd to cue pt for abdominal breathing (AB) however by session end pt was able to incorporate into phrase/sentence responses. Continue skilled ST to maximize intelligilbity and phonation.    Speech Therapy Frequency 2x / week   Treatment/Interventions SLP instruction and feedback;Internal/external aids;Compensatory strategies;Environmental controls;Patient/family education;Cueing hierarchy;Functional tasks   Potential to Achieve Goals Good   SLP Home Exercise  Plan See pt instructions, HEP for vocal fold adduction (gentle)   Consulted and Agree with Plan of Care Patient      Patient will benefit from skilled therapeutic intervention in order to improve the following deficits and impairments:   Other voice and resonance disorders    Problem List Patient Active Problem List   Diagnosis Date Noted  . Aortic atherosclerosis ([place]) 06/17/2017  . 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 Right carotid bruit 04/08/2017  . CKD stage 3 due to type 2  diabetes mellitus ([place]) 12/11/2016  . Diabetic neuropathy ([place]) 12/10/2016  . Small intestinal bacterial overgrowth 12/24/2015  . Goals of care, counseling/discussion 01/18/2015  . ASCUS with positive high risk HPV 08/03/2014  . PVD (peripheral vascular disease) ([place]) 03/27/2014  . Diastolic CHF, chronic ([place]) 12/21/2013  . Healthcare maintenance 04/25/2013  . Chronic pain syndrome 01/03/2013  . Insomnia 12/22/2011  . Vitamin B12 deficiency 12/27/2009  . Vitamin D deficiency 12/27/2009  . Anemia 12/27/2009  . Hypomagnesemia 12/29/2008  . Osteopenia 08/01/2008  . Hyperlipidemia 11/01/2006  . GLAUCOMA NOS 11/01/2006  . DM (diabetes mellitus), type 2, uncontrolled, periph vascular complic ([place]) 50/72/2575  . Obesity 08/26/2006  . Depression 08/26/2006  . HTN (hypertension) 08/26/2006  . Coronary atherosclerosis 08/26/2006  . GERD 08/26/2006   Deneise Lever, [place], [place] 08/05/2017, 1:14 PM  [facility] [address] [place], [place], [address] Phone: [phone]   Fax:  [phone]   Name: ARIELYS WANDERSEE MRN: [id number] Date of Birth: [birth year]

## 2017-08-10 ENCOUNTER — Telehealth: Payer: Self-pay | Admitting: Dietician

## 2017-08-10 ENCOUNTER — Encounter: Payer: Self-pay | Admitting: Dietician

## 2017-08-10 DIAGNOSIS — IMO0002 Reserved for concepts with insufficient information to code with codable children: Secondary | ICD-10-CM

## 2017-08-10 DIAGNOSIS — E1151 Type 2 diabetes mellitus with diabetic peripheral angiopathy without gangrene: Secondary | ICD-10-CM

## 2017-08-10 DIAGNOSIS — E1165 Type 2 diabetes mellitus with hyperglycemia: Secondary | ICD-10-CM

## 2017-08-10 MED ORDER — GLUCOSE BLOOD VI STRP: ORAL_STRIP | 12 refills | Status: DC

## 2017-08-10 MED ORDER — ACCU-CHEK FASTCLIX LANCETS MISC: 12 refills | Status: DC

## 2017-08-10 MED ORDER — ACCU-CHEK GUIDE W/DEVICE KIT: 1.0000 | PACK | Freq: Three times a day (TID) | 0 refills | Status: AC

## 2017-08-10 NOTE — Telephone Encounter (Signed)
Requests new meter to check her blood sugars

## 2017-08-11 ENCOUNTER — Ambulatory Visit: Payer: Medicare Other

## 2017-08-11 DIAGNOSIS — R498 Other voice and resonance disorders: Secondary | ICD-10-CM | POA: Diagnosis not present

## 2017-08-11 NOTE — Therapy (Signed)
[facility] [address] [place], [place], [address] Phone: [phone]   Fax:  [phone]  Speech Language Pathology Treatment  Patient Details  Name: [name] [name] MRN: [id number] Date of Birth: 10-01-1946 No Data Recorded  Encounter Date: 08/11/2017      End of Session - 08/11/17 1625    Visit Number 5   Number of Visits 17   Date for SLP Re-Evaluation 09/10/17   SLP Start Time 1105   SLP Stop Time  1146   SLP Time Calculation (min) 41 min   Activity Tolerance Patient tolerated treatment well      Past Medical History:  Diagnosis Date  . Anemia   . Arthritis   . CAD 08/26/2006   Cath 07/05:multiple areas of nonobstructive disease = medical & RF mgmt, well preserved global systolic function. Dr. Lia Foyer. Nuclear med stress test 01/2014 : Normal stress nuclear study. LV Ejection Fraction: 76%. LV Wall Motion: NL LV Function; NL Wall Motion.      . Chronic pain syndrome 01/03/2013   Multifactorial. Non opioid requiring.   L2-5 fusion, with hardware at L2-3, stable per MRI 2010. Followed by neurosurgery (Dr. 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    [name] DEPRESSION 08/26/2006   On paxil    . DIABETES MELLITUS, TYPE II 08/26/2006   Insulin dependent. Victoza added 03/2014  . DYSLIPIDEMIA 11/01/2006   LDL goal < 100, 70 if can achieve without side effects.     . Gallstones   . GERD 08/26/2006   Heartburn QHS.     [name] HYPERTENSION 08/26/2006   On BB, ACEI, lasix, norvasc, metolazone, and imdur.     . OBESITY 08/26/2006   BMI 39.5. Obesity Class 2.     . PVD (peripheral vascular disease) ([place]) 03/27/2014   2015 LE arterial Duplex - Possible mild inflow disease on the left. 0-49% bilateral SFA disease, without focal stenosis. Three vesel run-off, bilaterally. Medical tx.     Past Surgical History:  Procedure Laterality Date  . CHOLECYSTECTOMY     pt  denies 04/07/16  . endometrial biospy    . LEFT HEART CATH    . lumbar fusion surgery  10/08   L3-L5  . posterior lumbar interfusion surgery  07/18/07   L2-L3  . rotator cuff surgery Bilateral     There were no vitals filed for this visit.      Subjective Assessment - 08/11/17 1111    Subjective Pt reminds SLP she does not read and therefore full voice HEP was difficult to complete.   Currently in Pain? No/denies               ADULT SLP TREATMENT - 08/11/17 1131      General Information   Behavior/Cognition Alert;Cooperative;Pleasant mood     Treatment Provided   Treatment provided Cognitive-Linquistic     Cognitive-Linquistic Treatment   Treatment focused on Voice   Skilled Treatment Given that pt stated full HEP was diffiuclt to complete given pt inability to readi, SLP simplified voice HEP to five exercises with picture cues. Pt was able to tell SLP the exercises using simple one-word descriptions, and pictures PRN. SLP guided pt through practice for abdominal bfeathing (AB) with word (90% success) responses - coordination between breath and voicing needed rarely, and in sentence (approx 60% success) responses, with occasional min cues needed for coordination of breathing and voicing. Pt began to ID  near end of practice session when she was not using AB.      Assessment / Recommendations / Plan   Plan Continue with current plan of care     Progression Toward Goals   Progression toward goals Progressing toward goals          SLP Education - 08/11/17 1625    Education provided Yes   Education Details voice HEP - simplified   Person(s) Educated Patient   Methods Explanation;Demonstration   Comprehension Verbalized understanding;Returned demonstration;Need further instruction          SLP Short Term Goals - 08/11/17 1625      SLP SHORT TERM GOAL #1   Title Pt will follow 4 vocal hygiene strategies with occasional min A from family/ST over 4 sessions per pt  and family report   Baseline 08-05-17   Time 2   Period Weeks   Status On-going     SLP SHORT TERM GOAL #2   Title Pt will utilize abdominal breathing at sentence level 10/12 sentence response with occasional min A   Time 2   Period Weeks   Status On-going     SLP SHORT TERM GOAL #3   Title Pt will utilize confidential voice during structured speech tasks and simple conversation with occasional min A 80% of session   Time 2   Period Weeks          SLP Long Term Goals - 08/11/17 1626      SLP LONG TERM GOAL #1   Title Pt will follow 6 vocal hygien strategies with occasional min A from family over 4 sessions per pt/family report   Time 6   Period Weeks   Status On-going     SLP LONG TERM GOAL #2   Title Pt will perform HEP for gentle vocal fold adduction/phonation with occasional min A over 3 sessions   Time 6   Period Weeks   Status On-going     SLP LONG TERM GOAL #3   Title Pt will utilize abdominal breathing and confidential voice over 15 minute conversation with occasional min A   Time 6   Period Weeks   Status On-going     SLP LONG TERM GOAL #4   Title Pt will demonstrate WNL phonation over 12 minute conversation with rare min A over 2 sessions.    Time 6   Period Weeks   Status On-going          Plan - 08/11/17 1625    Clinical Impression Statement Pt continues to report use of vocally hygenic behaviors. SLP req'd to cue pt for abdominal breathing (AB) however by session end pt was able to incorporate into phrase/sentence responses. Continue skilled ST to maximize intelligilbity and phonation.    Speech Therapy Frequency 2x / week   Duration --  8 weeks   Treatment/Interventions SLP instruction and feedback;Internal/external aids;Compensatory strategies;Environmental controls;Patient/family education;Cueing hierarchy;Functional tasks   Potential to Achieve Goals Good   SLP Home Exercise Plan See pt instructions, HEP for vocal fold adduction (gentle)    Consulted and Agree with Plan of Care Patient      Patient will benefit from skilled therapeutic intervention in order to improve the following deficits and impairments:   Other voice and resonance disorders    Problem List Patient Active Problem List   Diagnosis Date Noted  . Aortic atherosclerosis ([place]) 06/17/2017  . 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 Right carotid bruit 04/08/2017  . CKD stage  3 due to type 2 diabetes mellitus ([place]) 12/11/2016  . Diabetic neuropathy ([place]) 12/10/2016  . Small intestinal bacterial overgrowth 12/24/2015  . Goals of care, counseling/discussion 01/18/2015  . ASCUS with positive high risk HPV 08/03/2014  . PVD (peripheral vascular disease) ([place]) 03/27/2014  . Diastolic CHF, chronic ([place]) 12/21/2013  . Healthcare maintenance 04/25/2013  . Chronic pain syndrome 01/03/2013  . Insomnia 12/22/2011  . Vitamin B12 deficiency 12/27/2009  . Vitamin D deficiency 12/27/2009  . Anemia 12/27/2009  . Hypomagnesemia 12/29/2008  . Osteopenia 08/01/2008  . Hyperlipidemia 11/01/2006  . GLAUCOMA NOS 11/01/2006  . DM (diabetes mellitus), type 2, uncontrolled, periph vascular complic ([place]) 36/46/8032  . Obesity 08/26/2006  . Depression 08/26/2006  . HTN (hypertension) 08/26/2006  . Coronary atherosclerosis 08/26/2006  . GERD 08/26/2006    [facility] ,[place], [place]  08/11/2017, 4:27 PM  [facility] [address] [place], [place], [address] Phone: [phone]   Fax:  [phone]   Name: [name] [name] MRN: [id number] Date of Birth: [birth year]

## 2017-08-11 NOTE — Patient Instructions (Signed)
  1) YAWN   2) PUSH   3) PULL   4) "NOLL"   5) "NOLL"

## 2017-08-12 ENCOUNTER — Ambulatory Visit: Payer: Medicare Other | Attending: Family Medicine

## 2017-08-12 DIAGNOSIS — R498 Other voice and resonance disorders: Secondary | ICD-10-CM

## 2017-08-12 NOTE — Therapy (Signed)
[facility] [address] [place], [place], [address] Phone: [phone]   Fax:  [phone]  Speech Language Pathology Treatment  Patient Details  Name: [name] [name] MRN: [id number] Date of Birth: 1946/06/08 No Data Recorded  Encounter Date: 08/12/2017      End of Session - 08/12/17 1645    Visit Number 6   Number of Visits 17   Date for SLP Re-Evaluation 09/10/17   SLP Start Time 0933   SLP Stop Time  6962   SLP Time Calculation (min) 42 min   Activity Tolerance Patient tolerated treatment well      Past Medical History:  Diagnosis Date  . Anemia   . Arthritis   . CAD 08/26/2006   Cath 07/05:multiple areas of nonobstructive disease = medical & RF mgmt, well preserved global systolic function. Dr. Lia Foyer. Nuclear med stress test 01/2014 : Normal stress nuclear study. LV Ejection Fraction: 76%. LV Wall Motion: NL LV Function; NL Wall Motion.      . Chronic pain syndrome 01/03/2013   Multifactorial. Non opioid requiring.   L2-5 fusion, with hardware at L2-3, stable per MRI 2010. Followed by neurosurgery (Dr. 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    [name] DEPRESSION 08/26/2006   On paxil    . DIABETES MELLITUS, TYPE II 08/26/2006   Insulin dependent. Victoza added 03/2014  . DYSLIPIDEMIA 11/01/2006   LDL goal < 100, 70 if can achieve without side effects.     . Gallstones   . GERD 08/26/2006   Heartburn QHS.     [name] HYPERTENSION 08/26/2006   On BB, ACEI, lasix, norvasc, metolazone, and imdur.     . OBESITY 08/26/2006   BMI 39.5. Obesity Class 2.     . PVD (peripheral vascular disease) ([place]) 03/27/2014   2015 LE arterial Duplex - Possible mild inflow disease on the left. 0-49% bilateral SFA disease, without focal stenosis. Three vesel run-off, bilaterally. Medical tx.     Past Surgical History:  Procedure Laterality Date  . CHOLECYSTECTOMY     pt  denies 04/07/16  . endometrial biospy    . LEFT HEART CATH    . lumbar fusion surgery  10/08   L3-L5  . posterior lumbar interfusion surgery  07/18/07   L2-L3  . rotator cuff surgery Bilateral     There were no vitals filed for this visit.      Subjective Assessment - 08/12/17 0942    Subjective "I didn't get sleep last night."   Currently in Pain? No/denies               ADULT SLP TREATMENT - 08/12/17 0945      General Information   Behavior/Cognition Alert;Cooperative;Pleasant mood     Treatment Provided   Treatment provided Cognitive-Linquistic     Cognitive-Linquistic Treatment   Treatment focused on Voice   Skilled Treatment Pt's report that she has had trouble sleeping necessitated SLP inquiry - pt has a suggested OTC sleep aid which she finished last night that has not helped. Pt's PCP has encouraged her to try different aids until she has one that works. SLP suggested pt take the box of the old med and ask the pharmacist which would he/she would recommend. Pt cont to report using 3-4 hygenic behaviors for voice. SLP reviewed and practiced her HEP with pt. She had most difficulty with "knoll" lo-hi pitch and "knoll" hi-lo pitch. SLP needed  to provide cues especially for inhibiting vocal fry on hi-lo. "pretty voice", demonstration cues, as well as negative practice/ID were used and after 5 minutes pt productions were improved. Pt stated she has felt the abdominal breathing (AB) during conversation since last session yesterday. Pt produced AB with single word confrontation naming and in simple question and answer tasks. In simple conversation pt used AB approx 75% of the time but as conversation progressed past 5 minutes pt usage decr'd to approx 40-50%. In discussion with pt afterwards, she indicated she realized she had gotten away from AB. SLP told pt to speak sentences (ten) for each show she watches on TV, focusing on "belly breathing".       Assessment /  Recommendations / Plan   Plan Continue with current plan of care     Progression Toward Goals   Progression toward goals Progressing toward goals          SLP Education - 08/11/17 1625    Education provided Yes   Education Details voice HEP - simplified   Person(s) Educated Patient   Methods Explanation;Demonstration   Comprehension Verbalized understanding;Returned demonstration;Need further instruction          SLP Short Term Goals - 08/12/17 1647      SLP SHORT TERM GOAL #1   Title Pt will follow 4 vocal hygiene strategies with occasional min A from family/ST over 4 sessions per pt and family report   Baseline 08-05-17   Time 2   Period Weeks   Status On-going     SLP SHORT TERM GOAL #2   Title Pt will utilize abdominal breathing at sentence level 10/12 sentence response with occasional min A   Status Achieved     SLP SHORT TERM GOAL #3   Title Pt will utilize confidential voice during structured speech tasks and simple conversation with occasional min A 80% of session   Time 2   Period Weeks          SLP Long Term Goals - 08/12/17 1648      SLP LONG TERM GOAL #1   Title Pt will follow 6 vocal hygien strategies with occasional min A from family over 4 sessions per pt/family report   Time 6   Period Weeks   Status On-going     SLP LONG TERM GOAL #2   Title Pt will perform HEP for gentle vocal fold adduction/phonation with occasional min A over 3 sessions   Time 6   Period Weeks   Status On-going     SLP LONG TERM GOAL #3   Title Pt will utilize abdominal breathing and confidential voice over 15 minute conversation with occasional min A   Time 6   Period Weeks   Status On-going     SLP LONG TERM GOAL #4   Title Pt will demonstrate WNL phonation over 12 minute conversation with rare min A over 2 sessions.    Time 6   Period Weeks   Status On-going          Plan - 08/12/17 1646    Clinical Impression Statement Pt continues to report use of  vocally hygenic behaviors. SLP req'd to cue pt for abdominal breathing (AB) in short conversation however was approx 80% successful in phrase/sentence responses. Continue skilled ST to maximize intelligilbity and phonation.    Speech Therapy Frequency 2x / week   Duration --  8 weeks   Treatment/Interventions SLP instruction and feedback;Internal/external aids;Compensatory strategies;Environmental controls;Patient/family education;Cueing hierarchy;Functional tasks  Potential to Achieve Goals Good   SLP Home Exercise Plan See pt instructions, HEP for vocal fold adduction (gentle)   Consulted and Agree with Plan of Care Patient      Patient will benefit from skilled therapeutic intervention in order to improve the following deficits and impairments:   Other voice and resonance disorders    Problem List Patient Active Problem List   Diagnosis Date Noted  . Aortic atherosclerosis ([place]) 06/17/2017  . 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 Right carotid bruit 04/08/2017  . CKD stage 3 due to type 2 diabetes mellitus ([place]) 12/11/2016  . Diabetic neuropathy ([place]) 12/10/2016  . Small intestinal bacterial overgrowth 12/24/2015  . Goals of care, counseling/discussion 01/18/2015  . ASCUS with positive high risk HPV 08/03/2014  . PVD (peripheral vascular disease) ([place]) 03/27/2014  . Diastolic CHF, chronic ([place]) 12/21/2013  . Healthcare maintenance 04/25/2013  . Chronic pain syndrome 01/03/2013  . Insomnia 12/22/2011  . Vitamin B12 deficiency 12/27/2009  . Vitamin D deficiency 12/27/2009  . Anemia 12/27/2009  . Hypomagnesemia 12/29/2008  . Osteopenia 08/01/2008  . Hyperlipidemia 11/01/2006  . GLAUCOMA NOS 11/01/2006  . DM (diabetes mellitus), type 2, uncontrolled, periph vascular complic ([place]) 29/19/1660  . Obesity 08/26/2006  . Depression 08/26/2006  . HTN (hypertension) 08/26/2006  . Coronary atherosclerosis 08/26/2006  . GERD 08/26/2006    [facility] ,Ruth,  CCC-SLP   08/12/2017, 4:48 PM  [facility] [address] [place], [place], [address] Phone: [phone]   Fax:  [phone]   Name: [name] [name] MRN: [id number] Date of Birth: 08/30/1946

## 2017-08-16 DIAGNOSIS — K219 Gastro-esophageal reflux disease without esophagitis: Secondary | ICD-10-CM | POA: Diagnosis not present

## 2017-08-16 DIAGNOSIS — J383 Other diseases of vocal cords: Secondary | ICD-10-CM | POA: Diagnosis not present

## 2017-08-18 ENCOUNTER — Ambulatory Visit: Payer: Medicare Other

## 2017-08-18 ENCOUNTER — Other Ambulatory Visit: Payer: Self-pay

## 2017-08-18 DIAGNOSIS — R498 Other voice and resonance disorders: Secondary | ICD-10-CM | POA: Diagnosis not present

## 2017-08-18 NOTE — Therapy (Signed)
[facility] [address] [place], [place], [address] Phone: [phone]   Fax:  [phone]  Speech Language Pathology Treatment  Patient Details  Name: [name] [name] MRN: [id number] Date of Birth: 1945/12/18 No Data Recorded  Encounter Date: 08/18/2017  End of Session - 08/18/17 1341    Visit Number  7    Number of Visits  17    Date for SLP Re-Evaluation  09/10/17    SLP Start Time  1018    SLP Stop Time   1055    SLP Time Calculation (min)  37 min    Activity Tolerance  Patient tolerated treatment well       Past Medical History:  Diagnosis Date  . Anemia   . Arthritis   . CAD 08/26/2006   Cath 07/05:multiple areas of nonobstructive disease = medical & RF mgmt, well preserved global systolic function. Dr. Lia Foyer. Nuclear med stress test 01/2014 : Normal stress nuclear study. LV Ejection Fraction: 76%. LV Wall Motion: NL LV Function; NL Wall Motion.      . Chronic pain syndrome 01/03/2013   Multifactorial. Non opioid requiring.   L2-5 fusion, with hardware at L2-3, stable per MRI 2010. Followed by neurosurgery (Dr. 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    [name] DEPRESSION 08/26/2006   On paxil    . DIABETES MELLITUS, TYPE II 08/26/2006   Insulin dependent. Victoza added 03/2014  . DYSLIPIDEMIA 11/01/2006   LDL goal < 100, 70 if can achieve without side effects.     . Gallstones   . GERD 08/26/2006   Heartburn QHS.     [name] HYPERTENSION 08/26/2006   On BB, ACEI, lasix, norvasc, metolazone, and imdur.     . OBESITY 08/26/2006   BMI 39.5. Obesity Class 2.     . PVD (peripheral vascular disease) ([place]) 03/27/2014   2015 LE arterial Duplex - Possible mild inflow disease on the left. 0-49% bilateral SFA disease, without focal stenosis. Three vesel run-off, bilaterally. Medical tx.     Past Surgical History:  Procedure Laterality Date  . CHOLECYSTECTOMY      pt denies 04/07/16  . endometrial biospy    . LEFT HEART CATH    . lumbar fusion surgery  10/08   L3-L5  . posterior lumbar interfusion surgery  07/18/07   L2-L3  . rotator cuff surgery Bilateral     There were no vitals filed for this visit.  Subjective Assessment - 08/18/17 1026    Subjective  Pt with notable improvement in vocal quality and strength from last session.    Currently in Pain?  No/denies            ADULT SLP TREATMENT - 08/18/17 1028      General Information   Behavior/Cognition  Alert;Cooperative;Pleasant mood      Treatment Provided   Treatment provided  Cognitive-Linquistic      Cognitive-Linquistic Treatment   Treatment focused on  Voice    Skilled Treatment  Pt rates her voice today 9/10 (10=WNL vocal quality). Pt also reports the "strong breathing, strong belly muscles" are also improving her quality. Today, SLP took pt to different locations of clinic (indoors and outdoors) to assess habitualization of abdominal breathing and breath support in different situational contexts. In each situation pt was observed to use abdominal breathing (AB) as well as WNL vocal quality by focus on breath support/abdominal muscle use. Pt cancelled pt  appointment tomorrow due to success in today's session. Pt agrees with this.       Assessment / Recommendations / Plan   Plan  Continue with current plan of care      Progression Toward Goals   Progression toward goals  Progressing toward goals         SLP Short Term Goals - 08/18/17 1343      SLP SHORT TERM GOAL #1   Title  Pt will follow 4 vocal hygiene strategies with occasional min A from family/ST over 4 sessions per pt and family report    Baseline  08-05-17, 08-18-17    Time  1    Period  Weeks    Status  On-going      SLP SHORT TERM GOAL #2   Title  Pt will utilize abdominal breathing at sentence level 10/12 sentence response with occasional min A    Status  Achieved      SLP SHORT TERM GOAL #3    Title  Pt will utilize confidential voice during structured speech tasks and simple conversation with occasional min A 80% of session    Time  2    Period  Weeks    Status  On-going       SLP Long Term Goals - 08/18/17 1343      SLP LONG TERM GOAL #1   Title  Pt will follow 6 vocal hygien strategies with occasional min A from family over 4 sessions per pt/family report    Time  5    Period  Weeks    Status  On-going      SLP LONG TERM GOAL #2   Title  Pt will perform HEP for gentle vocal fold adduction/phonation with occasional min A over 3 sessions    Time  5    Period  Weeks    Status  On-going      SLP LONG TERM GOAL #3   Title  Pt will utilize abdominal breathing and confidential voice over 15 minute conversation with occasional min A    Time  5    Period  Weeks    Status  On-going      SLP LONG TERM GOAL #4   Title  Pt will demonstrate WNL phonation over 12 minute conversation with rare min A over 2 sessions.     Time  5    Period  Weeks    Status  On-going       Plan - 08/18/17 1342    Clinical Impression Statement  Pt continues to report use of vocally hygenic behaviors. SLP use of abdominal breathing (AB) in min to mod complex conversation was approx 80% successful. Continue skilled ST to maximize intelligilbity and phonation.     Speech Therapy Frequency  2x / week    Duration  -- 8 weeks   8 weeks   Treatment/Interventions  SLP instruction and feedback;Internal/external aids;Compensatory strategies;Environmental controls;Patient/family education;Cueing hierarchy;Functional tasks    Potential to Achieve Goals  Good    SLP Home Exercise Plan  See pt instructions, HEP for vocal fold adduction (gentle)    Consulted and Agree with Plan of Care  Patient       Patient will benefit from skilled therapeutic intervention in order to improve the following deficits and impairments:   Other voice and resonance disorders    Problem List Patient Active Problem List    Diagnosis Date Noted  . Aortic atherosclerosis ([place]) 06/17/2017  . Age-related vocal  cord atrophy 05/24/2017  . Right carotid bruit 04/08/2017  . CKD stage 3 due to type 2 diabetes mellitus ([place]) 12/11/2016  . Diabetic neuropathy ([place]) 12/10/2016  . Small intestinal bacterial overgrowth 12/24/2015  . Goals of care, counseling/discussion 01/18/2015  . ASCUS with positive high risk HPV 08/03/2014  . PVD (peripheral vascular disease) ([place]) 03/27/2014  . Diastolic CHF, chronic ([place]) 12/21/2013  . Healthcare maintenance 04/25/2013  . Chronic pain syndrome 01/03/2013  . Insomnia 12/22/2011  . Vitamin B12 deficiency 12/27/2009  . Vitamin D deficiency 12/27/2009  . Anemia 12/27/2009  . Hypomagnesemia 12/29/2008  . Osteopenia 08/01/2008  . Hyperlipidemia 11/01/2006  . GLAUCOMA NOS 11/01/2006  . DM (diabetes mellitus), type 2, uncontrolled, periph vascular complic ([place]) 30/04/6225  . Obesity 08/26/2006  . Depression 08/26/2006  . HTN (hypertension) 08/26/2006  . Coronary atherosclerosis 08/26/2006  . GERD 08/26/2006    Weslynn Ke ,MS, CCC-SLP  08/18/2017, 1:44 PM  [facility] [address] [place], [place], [address] Phone: [phone]   Fax:  [phone]   Name: [name] [name] MRN: [id number] Date of Birth: 02/15/1946

## 2017-08-19 ENCOUNTER — Ambulatory Visit: Payer: Medicare Other | Admitting: Speech Pathology

## 2017-08-24 ENCOUNTER — Other Ambulatory Visit: Payer: Self-pay | Admitting: Internal Medicine

## 2017-08-25 ENCOUNTER — Ambulatory Visit: Payer: Medicare Other

## 2017-08-25 DIAGNOSIS — R498 Other voice and resonance disorders: Secondary | ICD-10-CM | POA: Diagnosis not present

## 2017-08-25 NOTE — Therapy (Signed)
[facility] [address] [place], [place], [address] Phone: [phone]   Fax:  [phone]  Speech Language Pathology Treatment  Patient Details  Name: [name] [name] MRN: [id number] Date of Birth: 31-Jan-1946 No Data Recorded  Encounter Date: 08/25/2017  End of Session - 08/25/17 1515    Visit Number  8    Number of Visits  17    Date for SLP Re-Evaluation  09/10/17    SLP Start Time  5    SLP Stop Time   1100    SLP Time Calculation (min)  38 min    Activity Tolerance  Patient tolerated treatment well       Past Medical History:  Diagnosis Date  . Anemia   . Arthritis   . CAD 08/26/2006   Cath 07/05:multiple areas of nonobstructive disease = medical & RF mgmt, well preserved global systolic function. Dr. Lia Foyer. Nuclear med stress test 01/2014 : Normal stress nuclear study. LV Ejection Fraction: 76%. LV Wall Motion: NL LV Function; NL Wall Motion.      . Chronic pain syndrome 01/03/2013   Multifactorial. Non opioid requiring.   L2-5 fusion, with hardware at L2-3, stable per MRI 2010. Followed by neurosurgery (Dr. 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    [name] DEPRESSION 08/26/2006   On paxil    . DIABETES MELLITUS, TYPE II 08/26/2006   Insulin dependent. Victoza added 03/2014  . DYSLIPIDEMIA 11/01/2006   LDL goal < 100, 70 if can achieve without side effects.     . Gallstones   . GERD 08/26/2006   Heartburn QHS.     [name] HYPERTENSION 08/26/2006   On BB, ACEI, lasix, norvasc, metolazone, and imdur.     . OBESITY 08/26/2006   BMI 39.5. Obesity Class 2.     . PVD (peripheral vascular disease) ([place]) 03/27/2014   2015 LE arterial Duplex - Possible mild inflow disease on the left. 0-49% bilateral SFA disease, without focal stenosis. Three vesel run-off, bilaterally. Medical tx.     Past Surgical History:  Procedure Laterality Date  . CHOLECYSTECTOMY      pt denies 04/07/16  . endometrial biospy    . LEFT HEART CATH    . lumbar fusion surgery  10/08   L3-L5  . posterior lumbar interfusion surgery  07/18/07   L2-L3  . rotator cuff surgery Bilateral     There were no vitals filed for this visit.  Subjective Assessment - 08/25/17 1027    Subjective  Pt arrives with daughter today.    Currently in Pain?  No/denies            ADULT SLP TREATMENT - 08/25/17 1028      General Information   Behavior/Cognition  Alert;Cooperative;Pleasant mood      Cognitive-Linquistic Treatment   Treatment focused on  Voice    Skilled Treatment  "Better than a 9" pt rated her voice today. Daughter states pt's voice is stronger now after therapy. Pt named 4 items she is doing at home to help her vocal hygiene, and reports completing her voice exercises daily. Pt, daughter, and SLP spoke for 20 minutes with pt utilizing abdominal breathing (AB) 80% of the time, with WNL vocal quality 95%, x2 utterances spoken on residual volume. Pt agrees with probable d/c tomorrow., prefers this to skipping tomorrow and coming back next week.      Assessment / Recommendations / Plan  Plan  Continue with current plan of care likely d/c tomorrow      Progression Toward Goals   Progression toward goals  Progressing toward goals         SLP Short Term Goals - 08/25/17 1030      SLP SHORT TERM GOAL #1   Title  Pt will follow 4 vocal hygiene strategies with occasional min A from family/ST over 4 sessions per pt and family report    Baseline  08-05-17, 08-18-17, (and 08-25-17)    Status  Partially Met 2/4 sessions      SLP SHORT TERM GOAL #2   Title  Pt will utilize abdominal breathing at sentence level 10/12 sentence response with occasional min A    Status  Achieved      SLP SHORT TERM GOAL #3   Title  Pt will utilize confidential voice during structured speech tasks and simple conversation with occasional min A 80% of session    Status  Achieved        SLP Long Term Goals - 08/25/17 1032      SLP LONG TERM GOAL #1   Title  Pt will follow 6 vocal hygiene strategies with occasional min A from family over 4 sessions per pt/family report    Time  5    Period  Weeks    Status  On-going      SLP LONG TERM GOAL #2   Title  Pt will perform HEP for gentle vocal fold adduction/phonation with occasional min A over 3 sessions    Status  Deferred pt with WNL phonation on 08-18-17 and 08-25-17)      SLP LONG TERM GOAL #3   Title  Pt will utilize abdominal breathing and confidential voice over 15 minute conversation with occasional min A    Time  --    Period  --    Status  Achieved      SLP LONG TERM GOAL #4   Title  Pt will demonstrate WNL phonation over 12 minute conversation with rare min A over 2 sessions.     Baseline  08-25-17    Time  5    Period  Weeks    Status  On-going       Plan - 08/25/17 1515    Clinical Impression Statement  Pt with excellent success today with using abdominal breathing (AB) and good vocal quality, with daughter to add to the conversational triad. Pt would like to continue tomorrow as her last ST to ensure consistency instead of next week, to cont skilled ST to maximize intelligilbity and phonation.[name]    Speech Therapy Frequency  2x / week    Duration  -- 8 weeks    Treatment/Interventions  SLP instruction and feedback;Internal/external aids;Compensatory strategies;Environmental controls;Patient/family education;Cueing hierarchy;Functional tasks    Potential to Achieve Goals  Good    SLP Home Exercise Plan  See pt instructions, HEP for vocal fold adduction (gentle)    Consulted and Agree with Plan of Care  Patient       Patient will benefit from skilled therapeutic intervention in order to improve the following deficits and impairments:   Other voice and resonance disorders    Problem List Patient Active Problem List   Diagnosis Date Noted  . Aortic atherosclerosis ([place]) 06/17/2017  . Age-related  vocal cord atrophy 05/24/2017  . Right carotid bruit 04/08/2017  . CKD stage 3 due to type 2 diabetes mellitus ([place]) 12/11/2016  . Diabetic neuropathy ([place]) 12/10/2016  .  Small intestinal bacterial overgrowth 12/24/2015  . Goals of care, counseling/discussion 01/18/2015  . ASCUS with positive high risk HPV 08/03/2014  . PVD (peripheral vascular disease) ([place]) 03/27/2014  . Diastolic CHF, chronic ([place]) 12/21/2013  . Healthcare maintenance 04/25/2013  . Chronic pain syndrome 01/03/2013  . Insomnia 12/22/2011  . Vitamin B12 deficiency 12/27/2009  . Vitamin D deficiency 12/27/2009  . Anemia 12/27/2009  . Hypomagnesemia 12/29/2008  . Osteopenia 08/01/2008  . Hyperlipidemia 11/01/2006  . GLAUCOMA NOS 11/01/2006  . DM (diabetes mellitus), type 2, uncontrolled, periph vascular complic ([place]) 98/47/3085  . Obesity 08/26/2006  . Depression 08/26/2006  . HTN (hypertension) 08/26/2006  . Coronary atherosclerosis 08/26/2006  . GERD 08/26/2006    [facility] ,[place], CCC-SLP  08/25/2017, 3:18 PM  [facility] [address] [place], [place], [address] Phone: [phone]   Fax:  [phone]   Name: [name] [name] MRN: [id number] Date of Birth: 12/04/1945

## 2017-08-26 ENCOUNTER — Ambulatory Visit: Payer: Medicare Other

## 2017-08-26 ENCOUNTER — Other Ambulatory Visit: Payer: Self-pay

## 2017-08-26 DIAGNOSIS — R498 Other voice and resonance disorders: Secondary | ICD-10-CM | POA: Diagnosis not present

## 2017-08-26 NOTE — Therapy (Signed)
[facility] [address] [place], [place], [address] Phone: [phone]   Fax:  [phone]  Speech Language Pathology Treatment  Patient Details  Name: [name] [name] MRN: [id number] Date of Birth: 1946/04/03 Referring MD, Gavin Pound, MD  Encounter Date: 08/26/2017  End of Session - 08/26/17 1046    Visit Number  9    Number of Visits  17    Date for SLP Re-Evaluation  09/10/17    SLP Start Time  69    SLP Stop Time   1050    SLP Time Calculation (min)  30 min    Activity Tolerance  Patient tolerated treatment well       Past Medical History:  Diagnosis Date  . Anemia   . Arthritis   . CAD 08/26/2006   Cath 07/05:multiple areas of nonobstructive disease = medical & RF mgmt, well preserved global systolic function. Dr. Lia Foyer. Nuclear med stress test 01/2014 : Normal stress nuclear study. LV Ejection Fraction: 76%. LV Wall Motion: NL LV Function; NL Wall Motion.      . Chronic pain syndrome 01/03/2013   Multifactorial. Non opioid requiring.   L2-5 fusion, with hardware at L2-3, stable per MRI 2010. Followed by neurosurgery (Dr. 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    [name] DEPRESSION 08/26/2006   On paxil    . DIABETES MELLITUS, TYPE II 08/26/2006   Insulin dependent. Victoza added 03/2014  . DYSLIPIDEMIA 11/01/2006   LDL goal < 100, 70 if can achieve without side effects.     . Gallstones   . GERD 08/26/2006   Heartburn QHS.     [name] HYPERTENSION 08/26/2006   On BB, ACEI, lasix, norvasc, metolazone, and imdur.     . OBESITY 08/26/2006   BMI 39.5. Obesity Class 2.     . PVD (peripheral vascular disease) ([place]) 03/27/2014   2015 LE arterial Duplex - Possible mild inflow disease on the left. 0-49% bilateral SFA disease, without focal stenosis. Three vesel run-off, bilaterally. Medical tx.     Past Surgical History:  Procedure Laterality  Date  . CHOLECYSTECTOMY     pt denies 04/07/16  . endometrial biospy    . LEFT HEART CATH    . lumbar fusion surgery  10/08   L3-L5  . posterior lumbar interfusion surgery  07/18/07   L2-L3  . rotator cuff surgery Bilateral     There were no vitals filed for this visit.  Subjective Assessment - 08/26/17 1024    Subjective  Pt arrives alone today.            ADULT SLP TREATMENT - 08/26/17 1024      General Information   Behavior/Cognition  Alert;Cooperative;Pleasant mood      Treatment Provided   Treatment provided  Cognitive-Linquistic      Cognitive-Linquistic Treatment   Treatment focused on  Voice    Skilled Treatment  Pt req'd SBA with her HEP today. SLP ensured pt achieved good vocal quality and proper breathing in 20 minutes conversation. Pt reports being very pleased with her voice at this time.       Assessment / Recommendations / Plan   Plan  Discharge SLP treatment due to (comment) pt meeting goals      Progression Toward Goals   Progression toward goals  Goals met, education completed, patient discharged from [facility]  Term Goals - 08/25/17 1030      SLP SHORT TERM GOAL #1   Title  Pt will follow 4 vocal hygiene strategies with occasional min A from family/ST over 4 sessions per pt and family report    Baseline  08-05-17, 08-18-17, (and 08-25-17)    Status  Partially Met 2/4 sessions      SLP SHORT TERM GOAL #2   Title  Pt will utilize abdominal breathing at sentence level 10/12 sentence response with occasional min A    Status  Achieved      SLP SHORT TERM GOAL #3   Title  Pt will utilize confidential voice during structured speech tasks and simple conversation with occasional min A 80% of session    Status  Achieved       SLP Long Term Goals - 09/02/2017 1044      SLP LONG TERM GOAL #1   Title  Pt will follow 6 vocal hygiene strategies with occasional min A from family over 4 sessions per pt/family report    Status  Partially Met  4 strategies reported      SLP LONG TERM GOAL #2   Title  Pt will perform HEP for gentle vocal fold adduction/phonation with occasional min A over 3 sessions    Status  Deferred pt with WNL phonation on 08-18-17 and 08-25-17)      SLP LONG TERM GOAL #3   Title  Pt will utilize abdominal breathing and confidential voice over 15 minute conversation with occasional min A    Status  Achieved      SLP LONG TERM GOAL #4   Title  Pt will demonstrate WNL phonation over 12 minute conversation with rare min A over 2 sessions.     Status  Achieved       Plan - 09-02-17 1225    Clinical Impression Statement  Pt with excellent success today continuing with abdominal breathing (AB) and good vocal quality. Pt is very pleased with her voice at this time. She will be d/c'd from skilled ST for meeting all goals.     Speech Therapy Frequency  2x / week    Duration  -- 8 weeks    Treatment/Interventions  SLP instruction and feedback;Internal/external aids;Compensatory strategies;Environmental controls;Patient/family education;Cueing hierarchy;Functional tasks    Potential to Achieve Goals  Good    SLP Home Exercise Plan  See pt instructions, HEP for vocal fold adduction (gentle)    Consulted and Agree with Plan of Care  Patient       Patient will benefit from skilled therapeutic intervention in order to improve the following deficits and impairments:   Other voice and resonance disorders  G-Codes - 09-02-2017 1227    Functional Assessment Tool Used  noms, clinical judgment    Functional Limitations  Voice    Voice Goal Status (G9172)  At least 20 percent but less than 40 percent impaired, limited or restricted    Voice Discharge Status (O9629)  At least 1 percent but less than 20 percent impaired, limited or restricted      [place]  Visits from Start of Care: 9  Current functional level related to goals / functional outcomes: Pt with excellent success with vocal quality  and with abdominal breathing. She reports she is very satisfied with her voice quality.   Remaining deficits: None   Education / Equipment: Abdominal breathing, voice hygiene measures, HEP for improving vocal quality.  Plan: Patient agrees to discharge.  Patient  goals were partially met. Patient is being discharged due to meeting the stated rehab goals.  ?????        Problem List Patient Active Problem List   Diagnosis Date Noted  . Aortic atherosclerosis ([place]) 06/17/2017  . 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 Right carotid bruit 04/08/2017  . CKD stage 3 due to type 2 diabetes mellitus ([place]) 12/11/2016  . Diabetic neuropathy ([place]) 12/10/2016  . Small intestinal bacterial overgrowth 12/24/2015  . Goals of care, counseling/discussion 01/18/2015  . ASCUS with positive high risk HPV 08/03/2014  . PVD (peripheral vascular disease) ([place]) 03/27/2014  . Diastolic CHF, chronic ([facility]) 12/21/2013  . Healthcare maintenance 04/25/2013  . Chronic pain syndrome 01/03/2013  . Insomnia 12/22/2011  . Vitamin B12 deficiency 12/27/2009  . Vitamin D deficiency 12/27/2009  . Anemia 12/27/2009  . Hypomagnesemia 12/29/2008  . Osteopenia 08/01/2008  . Hyperlipidemia 11/01/2006  . GLAUCOMA NOS 11/01/2006  . DM (diabetes mellitus), type 2, uncontrolled, periph vascular complic ([place] [facility]) 73/41/9379  . Obesity 08/26/2006  . Depression 08/26/2006  . HTN (hypertension) 08/26/2006  . Coronary atherosclerosis 08/26/2006  . GERD 08/26/2006    [facility], [place]  08/26/2017, 12:27 PM  [facility] [address] [place], [place], [address] Phone: [phone]   Fax:  [phone]   Name: [name] [name] MRN: [id number] Date of Birth: [birth year]

## 2017-08-26 NOTE — Patient Instructions (Signed)
2 more weeks - yawn, pull, push, up and down

## 2017-08-27 NOTE — Telephone Encounter (Signed)
Next appt scheduled  11/18/17 with PCP. 

## 2017-08-30 ENCOUNTER — Encounter: Payer: Medicare Other | Admitting: Speech Pathology

## 2017-08-30 NOTE — Telephone Encounter (Signed)
This printed. Would you pls call in Lyrica.

## 2017-08-31 ENCOUNTER — Other Ambulatory Visit: Payer: Self-pay | Admitting: Internal Medicine

## 2017-08-31 ENCOUNTER — Encounter: Payer: Medicare Other | Admitting: Speech Pathology

## 2017-08-31 NOTE — Telephone Encounter (Signed)
Rx called to the pharmacy. 

## 2017-08-31 NOTE — Telephone Encounter (Signed)
Lyrica rx called to [facility]. 

## 2017-09-01 ENCOUNTER — Telehealth: Payer: Self-pay

## 2017-09-01 NOTE — Telephone Encounter (Signed)
Reminded patient to come get labs rechecked.

## 2017-09-01 NOTE — Telephone Encounter (Signed)
-----   Message from Jeoffrey Massed, RN sent at 05/31/2017 10:10 AM EDT ----- Pt to get repeat CBC see 8/20 note

## 2017-09-06 ENCOUNTER — Other Ambulatory Visit: Payer: Self-pay

## 2017-09-06 ENCOUNTER — Other Ambulatory Visit (INDEPENDENT_AMBULATORY_CARE_PROVIDER_SITE_OTHER): Payer: Medicare Other

## 2017-09-06 DIAGNOSIS — D649 Anemia, unspecified: Secondary | ICD-10-CM

## 2017-09-06 LAB — CBC WITH DIFFERENTIAL/PLATELET
Basophils Absolute: 0.1 10*3/uL (ref 0.0–0.1)
Basophils Relative: 1.3 % (ref 0.0–3.0)
Eosinophils Absolute: 0.1 10*3/uL (ref 0.0–0.7)
Eosinophils Relative: 1.2 % (ref 0.0–5.0)
HCT: 36.9 % (ref 36.0–46.0)
Hemoglobin: 12.2 g/dL (ref 12.0–15.0)
Lymphocytes Relative: 52.6 % — ABNORMAL HIGH (ref 12.0–46.0)
Lymphs Abs: 3.4 10*3/uL (ref 0.7–4.0)
MCHC: 33.2 g/dL (ref 30.0–36.0)
MCV: 86.4 fl (ref 78.0–100.0)
Monocytes Absolute: 0.4 10*3/uL (ref 0.1–1.0)
Monocytes Relative: 6.1 % (ref 3.0–12.0)
Neutro Abs: 2.5 10*3/uL (ref 1.4–7.7)
Neutrophils Relative %: 38.8 % — ABNORMAL LOW (ref 43.0–77.0)
Platelets: 304 10*3/uL (ref 150.0–400.0)
RBC: 4.28 Mil/uL (ref 3.87–5.11)
RDW: 16 % — ABNORMAL HIGH (ref 11.5–15.5)
WBC: 6.5 10*3/uL (ref 4.0–10.5)

## 2017-09-08 ENCOUNTER — Telehealth: Payer: Self-pay | Admitting: Dietician

## 2017-09-08 NOTE — Telephone Encounter (Signed)
Call was transferred because patient wanted to talk to someone about her blood sugars that have been "High" er than usual, she did not eat much for thanksgiving and blood sugar has been higher since she was in the hospital and they took her off her diabetes medicine, when asked about her Victoza she responded that they took her off of it when she was I the hospital and all she takes is 12 units of lantus daily. She thinks this is why her blood sugars are higher.  Last week 200s, this week   212/214/238  After reviewing her chart, I saw metformin was stopped after her 02/2017 hospitalization and victoza and lantus were continued. I tried calling her back to be sure she was not confusing it with the victoza, but there was no answer.   I told her that it was great for her to bring this to our attention and that I would ask Dr. Lynnae January what she'd like for her to do.   Her next appointment is 11/18/2017 with Dr. Lynnae January.

## 2017-09-09 ENCOUNTER — Encounter: Payer: Medicare Other | Admitting: Speech Pathology

## 2017-09-10 NOTE — Telephone Encounter (Signed)
I thought she was taking both lantus 10 and victoza. If you are able to reach her one phone would you pls 1. Get her CBG levels? 2. Tell her OK to restart victoza (had been well controlled on combo) 3. Any hypoglycemia? 4. Call her back after starting victoza 5. Offer her ACC appt?  Thanks!

## 2017-09-10 NOTE — Telephone Encounter (Signed)
Called [name], she confirmed she is only taking lantus, but has victoza and will restart it at her previous dose.  Her blood sugars yesterday were 216 fasting, 209 and today were 165 fasting,132, lunchtime.  She feels fine, no hypoglycemic symptoms does not feel she needs to see a doctor. Agrees to a call next week to follow up

## 2017-09-13 NOTE — Telephone Encounter (Signed)
Thanks! Hopefully the victoza will get her closer to goal.

## 2017-09-28 NOTE — Telephone Encounter (Signed)
Follow up call after restarting Victoza:  "Blood sugar doing fine now". She concurs that she is taking 1.2 mg victoza and 10 units lantus daily 164 today, . 128 this afternoon 164 yesterday am, 169 yesterday afternoon,  145 for supper 178 last Monday a week ago. No other questions or concerns- she agreed to call if they arise.

## 2017-09-28 NOTE — Telephone Encounter (Signed)
Thanks. Has Feb appt with me

## 2017-10-04 ENCOUNTER — Other Ambulatory Visit: Payer: Self-pay | Admitting: Internal Medicine

## 2017-10-04 ENCOUNTER — Other Ambulatory Visit: Payer: Self-pay | Admitting: Cardiology

## 2017-10-07 MED ORDER — INSULIN GLARGINE 100 UNIT/ML SOLOSTAR PEN: 10.0000 [IU] | PEN_INJECTOR | Freq: Every day | SUBCUTANEOUS | 2 refills | Status: DC

## 2017-10-07 NOTE — Telephone Encounter (Signed)
A1C has been < 7. I last documented on 12 units lantus but OK refilling for 10 2/2 low A1C  Metformin stopped May in hospital 2/2 AKI. Renal fxn now nl. Pls ask if she is in fact taking. If she is, I will be happy to refill.  Pls also ensure no hypoglycemia.  Thanks

## 2017-10-07 NOTE — Telephone Encounter (Signed)
Not on current med list; ? Discontinued  

## 2017-10-08 ENCOUNTER — Encounter: Payer: Self-pay | Admitting: Internal Medicine

## 2017-10-08 NOTE — Telephone Encounter (Signed)
Thanks She should have refills on all meds now. Butch Penny - med rec is fine if she wants to come in. She normally is on top of things though

## 2017-10-08 NOTE — Telephone Encounter (Signed)
Called pt - stated her blod sugars were running high so she talked to Christene Slates and started back on Metformin; BS were 200's/300's Now they are 148/145/136 . Stated she takes victoza in the morning, lantus at night and metformin during the day. And she does not need refill on metformin; already has 3 bottles.

## 2017-10-13 ENCOUNTER — Other Ambulatory Visit: Payer: Self-pay | Admitting: Dietician

## 2017-10-13 DIAGNOSIS — IMO0002 Reserved for concepts with insufficient information to code with codable children: Secondary | ICD-10-CM

## 2017-10-13 DIAGNOSIS — E1151 Type 2 diabetes mellitus with diabetic peripheral angiopathy without gangrene: Secondary | ICD-10-CM

## 2017-10-13 DIAGNOSIS — E1165 Type 2 diabetes mellitus with hyperglycemia: Secondary | ICD-10-CM

## 2017-10-13 NOTE — Progress Notes (Signed)
signed

## 2017-10-20 ENCOUNTER — Encounter: Payer: Self-pay | Admitting: *Deleted

## 2017-10-20 ENCOUNTER — Encounter: Payer: Self-pay | Admitting: Dietician

## 2017-10-20 ENCOUNTER — Ambulatory Visit (INDEPENDENT_AMBULATORY_CARE_PROVIDER_SITE_OTHER): Payer: Medicare Other | Admitting: Dietician

## 2017-10-20 DIAGNOSIS — E1151 Type 2 diabetes mellitus with diabetic peripheral angiopathy without gangrene: Secondary | ICD-10-CM

## 2017-10-20 DIAGNOSIS — E119 Type 2 diabetes mellitus without complications: Secondary | ICD-10-CM

## 2017-10-20 DIAGNOSIS — Z794 Long term (current) use of insulin: Secondary | ICD-10-CM | POA: Diagnosis not present

## 2017-10-20 DIAGNOSIS — Z713 Dietary counseling and surveillance: Secondary | ICD-10-CM

## 2017-10-20 DIAGNOSIS — IMO0002 Reserved for concepts with insufficient information to code with codable children: Secondary | ICD-10-CM

## 2017-10-20 DIAGNOSIS — Z6836 Body mass index (BMI) 36.0-36.9, adult: Secondary | ICD-10-CM | POA: Diagnosis not present

## 2017-10-20 DIAGNOSIS — E1165 Type 2 diabetes mellitus with hyperglycemia: Secondary | ICD-10-CM

## 2017-10-20 NOTE — Progress Notes (Signed)
Diabetes Self-Management Education  Visit Type: First/Initial  Appt. Start Time: 908 Appt. End Time: 1008  10/20/2017  [name], identified by name and date of birth, is a 72 y.o. female with a diagnosis of Diabetes: Type 2.   ASSESSMENT  Weight 217 lb (98.4 kg). increased about 10# in past 3 months Body mass index is 36.11 kg/m.  Last visit was 2015  Medications reviewed and coincide with her current medication list except:  1- acetaminophen- not taking 2- Loratadine- not taking 3- Lantus- reports taking 12 units each night 4- Magnesium Cl-Calcium Carbonate - not taking 5- ranitidine- not taking  New accu chek guide provided to her because her was in another language Meter download-  Average 155 for past 3 months, range 89 to 247, all blood sugar in past week are in mid 100's. Fasting today was 195 Confirmed her appointment with Dr. Lynnae January for 11/18/17   Diabetes Self-Management Education - 10/20/17 0900      Visit Information   Visit Type  First/Initial      Initial Visit   Diabetes Type  Type 2    Are you currently following a meal plan?  Yes    What type of meal plan do you follow?  no frying, three balanced meals a day    Are you taking your medications as prescribed?  Yes    Date Diagnosed  [place]   How would you rate your overall health?  Excellent      Psychosocial Assessment   Self-care barriers  None    Self-management support  Doctor's office;Church;Friends;Family;CDE visits    Patient Concerns  Medication      Pre-Education Assessment   Patient understands the diabetes disease and treatment process.  Demonstrates understanding / competency    Patient understands incorporating nutritional management into lifestyle.  Demonstrates understanding / competency    Patient undertands incorporating physical activity into lifestyle.  Demonstrates understanding / competency    Patient understands using medications safely.  Needs Review    Patient understands monitoring blood glucose, interpreting and using results  Needs Review    Patient understands prevention, detection, and treatment of acute complications.  Demonstrates understanding / competency    Patient understands prevention, detection, and treatment of chronic complications.  Demonstrates understanding / competency    Patient understands how to develop strategies to address psychosocial issues.  Demonstrates understanding / competency    Patient understands how to develop strategies to promote health/change behavior.  Needs Review      Complications   Last HgB A1C per patient/outside source  6 %    How often do you check your blood sugar?  3-4 times/day    Fasting Blood glucose range (mg/dL)  130-179;180-200    Postprandial Blood glucose range (mg/dL)  70-129;130-179;180-200;>200    Number of hypoglycemic episodes per month  0    Number of hyperglycemic episodes per week  11    Can you tell when your blood sugar is high?  Yes    Have you had a dilated eye exam in the past 12 months?  Yes    Have you had a dental exam in the past 12 months?  Yes    Are you checking your feet?  Yes    How many days per week are you checking your feet?  7 reports no problems      Exercise   Exercise Type  ADL's;Light (walking / raking leaves)  Patient Education   Previous Diabetes Education  Yes (please comment) here     Medications  Reviewed patients medication for diabetes, action, purpose, timing of dose and side effects.    Monitoring  Taught/evaluated SMBG meter.    Personal strategies to promote health  Helped patient develop diabetes management plan for (enter comment) when she has issues with her meter/medications      Individualized Goals (developed by patient)   Health Coping  ask for help with (comment) if needed with diabetes care- meter was in another language       Outcomes   Expected Outcomes  Demonstrated interest in learning. Expect positive outcomes     Future DMSE  Yearly    Program Status  Completed       Individualized Plan for Diabetes Self-Management Training:   Learning Objective:  Patient will have a greater understanding of diabetes self-management. Patient education plan is to attend individual and/or group sessions per assessed needs and concerns.   Plan:   Patient Instructions   My plan to support myself in continuing these changes to care for my diabetes is to attend or contact:    Type 2 diabetes support group : 2nd Monday of every month from 6-7 PM at [address] [facility] conference room [phone] OR  My doctor's office, Diabetes Educator, Dietitian, pharmacist, church friends   Your weight has increased 10 pounds since September.   Keep baking foods, eating half a plate of vegetables at lunch and dinner.  Walk most days; when it is cold or rainy can walk indoors or at store or mall or [facility] or [employer].  Follow in 1 year- I will contact you next December.   Butch Penny [phone]   Expected Outcomes:  Demonstrated interest in learning. Expect positive outcomes  Education material provided: Living Well with Diabetes  If problems or questions, patient to contact team via:  Phone  Future DSME appointment: Sidney Ace, Butch Penny, [place] 10/20/2017 10:44 AM.

## 2017-10-20 NOTE — Patient Instructions (Signed)
My plan to support myself in continuing these changes to care for my diabetes is to attend or contact:    Type 2 diabetes support group : 2nd Monday of every month from 6-7 PM at [address] [facility] conference room [phone] OR  My doctor's office, Diabetes Educator, Dietitian, pharmacist, church friends   Your weight has increased 10 pounds since September.   Keep baking foods, eating half a plate of vegetables at lunch and dinner.  Walk most days; when it is cold or rainy can walk indoors or at store or mall or [facility] or [employer].  Follow in 1 year- I will contact you next December.   Butch Penny [phone]

## 2017-11-15 ENCOUNTER — Other Ambulatory Visit: Payer: Self-pay

## 2017-11-15 MED ORDER — TRAMADOL HCL 50 MG PO TABS: 50.0000 mg | ORAL_TABLET | Freq: Two times a day (BID) | ORAL | 1 refills | Status: DC

## 2017-11-18 ENCOUNTER — Encounter: Payer: Self-pay | Admitting: Internal Medicine

## 2017-11-18 ENCOUNTER — Ambulatory Visit (INDEPENDENT_AMBULATORY_CARE_PROVIDER_SITE_OTHER): Payer: Medicare Other | Admitting: Internal Medicine

## 2017-11-18 VITALS — BP 133/55 | HR 79 | Temp 97.9°F | Wt 216.5 lb

## 2017-11-18 DIAGNOSIS — G894 Chronic pain syndrome: Secondary | ICD-10-CM

## 2017-11-18 DIAGNOSIS — E1142 Type 2 diabetes mellitus with diabetic polyneuropathy: Secondary | ICD-10-CM

## 2017-11-18 DIAGNOSIS — E1165 Type 2 diabetes mellitus with hyperglycemia: Secondary | ICD-10-CM | POA: Diagnosis not present

## 2017-11-18 DIAGNOSIS — E1151 Type 2 diabetes mellitus with diabetic peripheral angiopathy without gangrene: Secondary | ICD-10-CM

## 2017-11-18 DIAGNOSIS — IMO0002 Reserved for concepts with insufficient information to code with codable children: Secondary | ICD-10-CM

## 2017-11-18 DIAGNOSIS — J383 Other diseases of vocal cords: Secondary | ICD-10-CM

## 2017-11-18 DIAGNOSIS — I10 Essential (primary) hypertension: Secondary | ICD-10-CM | POA: Diagnosis not present

## 2017-11-18 LAB — POCT GLYCOSYLATED HEMOGLOBIN (HGB A1C): Hemoglobin A1C: 8.5

## 2017-11-18 LAB — GLUCOSE, CAPILLARY: Glucose-Capillary: 91 mg/dL (ref 65–99)

## 2017-11-18 MED ORDER — LORATADINE 10 MG PO TABS: 10.0000 mg | ORAL_TABLET | Freq: Every day | ORAL | 3 refills | Status: DC

## 2017-11-18 NOTE — Progress Notes (Signed)
   Subjective:    Patient ID: [name] [name], female    DOB: Sep 30, 1946, 72 y.o.   MRN: [id number]  HPI  [name] [name] is here for DM F/U. Please see the A&P for the status of the pt's chronic medical problems.  ROS : per ROS section and in problem oriented charting. All other systems are negative.  PMHx, Soc hx, and / or Fam hx : She lives alone   Review of Systems  HENT: Positive for rhinorrhea. Negative for sneezing.        Mucus in throat since November  Eyes: Positive for discharge and itching.  Respiratory: Negative for cough and shortness of breath.   Cardiovascular: Negative for chest pain.  Gastrointestinal: Positive for constipation. Negative for diarrhea.  Genitourinary: Negative for dysuria.  Skin: Negative for rash.       Objective:   Physical Exam  Constitutional: She appears well-developed and well-nourished. No distress.  HENT:  Head: Normocephalic and atraumatic.  Right Ear: External ear normal.  Left Ear: External ear normal.  Nose: Nose normal.  Eyes: Conjunctivae and EOM are normal. Right eye exhibits no discharge. Left eye exhibits no discharge. No scleral icterus.  Cardiovascular: Normal rate, regular rhythm and normal heart sounds.  No murmur heard. Pulmonary/Chest: Effort normal and breath sounds normal. No respiratory distress.  Neurological: She is alert.  Skin: Skin is warm and dry. She is not diaphoretic.  Psychiatric: She has a normal mood and affect. Her behavior is normal. Judgment and thought content normal.      Assessment & Plan:

## 2017-11-18 NOTE — Assessment & Plan Note (Signed)
This problem is chronic and controlled. She is on atenolol 100, amlodipine 5, Lasix 40 once a day. She does check her blood pressure at home but does not recall her readings. She is at goal today in the clinic and has been over her past 3 appointments.  PLAN:  Cont current meds    BP Readings from Last 3 Encounters:  11/18/17 (!) 133/55  06/17/17 135/65  06/01/17 118/62

## 2017-11-18 NOTE — Assessment & Plan Note (Signed)
This problem is chronic and improved. She states her symptoms are now fine and she is having no problems. She is not going to therapy any more but still seeing her ENT doctor.  PLAN : follow ENT notes

## 2017-11-18 NOTE — Patient Instructions (Signed)
1. Restart Metformin to help your sugar. Start with one half pill once a day for 5 days then one half pill twice a day for 5 days, then one pill twice a day.  2. Let me know if you have any stomach problems. 3. I will refill your allergy pill 4. See me in 3 months

## 2017-11-18 NOTE — Assessment & Plan Note (Signed)
This problem is chronic and stable. She is on Lyrica. She describes classical neuropathy symptoms in her lower extremities proximally to her knees. But then she states she gets the symptoms all over her body and has to rub her lower legs to get them to go away. She understands the Lyrica is to help the neuropathy and does feel it helps her neuropathy.  PLAN : Continue Lyrica

## 2017-11-18 NOTE — Assessment & Plan Note (Signed)
This problem is chronic and stable. She has tramadol which she uses as needed. She has not used any in the past week. When she does use it, it is for her neck and shoulder pain. It does well and does not cause her any trouble.  PLAN : cont tramadol PRN

## 2017-11-18 NOTE — Assessment & Plan Note (Signed)
This problem is chronic and worsened. Her A1c trend has been 6.7 - 6.6 - 8.5 today. She had noticed that her sugars have been elevated at home, being above 200 fasting and in the high 100s later in the day. Her CBG log confirms this. She checks her sugar 3 times a day. It is 129 before bed but otherwise consistently between 150s and 180s. Her medication regimen is Lantus 12 units at bedtime and Victoza. She denies hypoglycemia. She denies any symptoms of an illness that would cause hypoglycemia. Last year, she was hospitalized for sepsis and acute renal failure and was sent home off her metformin. Her A1c had remained controlled and I never resumed the metformin even though her creatinine remained at baseline which is about 1.0. I would like to get her A1c down closer to 7 and metformin might be able to lower it that much. We went over how to taper up her metformin from 500 once a day back to her previous dose of 100 BID. We discussed that this may even help with her constipation but she is to call me if it causes GI distress. If metformin does not get her ankle, we have many other medications we can add.  PLAN : Lantus 12 at bedtime Victoza Add metformin starting at 500 once a day titrating up to 1000 BID

## 2017-11-19 ENCOUNTER — Encounter: Payer: Self-pay | Admitting: Internal Medicine

## 2017-11-19 LAB — BMP8+ANION GAP
Anion Gap: 14 mmol/L (ref 10.0–18.0)
BUN/Creatinine Ratio: 15 (ref 12–28)
BUN: 13 mg/dL (ref 8–27)
CO2: 28 mmol/L (ref 20–29)
Calcium: 9.6 mg/dL (ref 8.7–10.3)
Chloride: 100 mmol/L (ref 96–106)
Creatinine, Ser: 0.87 mg/dL (ref 0.57–1.00)
GFR calc Af Amer: 77 mL/min/{1.73_m2} (ref >59)
GFR calc non Af Amer: 67 mL/min/{1.73_m2} (ref >59)
Glucose: 97 mg/dL (ref 65–99)
Potassium: 4.6 mmol/L (ref 3.5–5.2)
Sodium: 142 mmol/L (ref 134–144)

## 2017-11-22 NOTE — Progress Notes (Signed)
Cardiology Office Note    Date:  11/23/2017   ID:  [name], [name] April 15, 1946, MRN [id number]  PCP:  Bartholomew Crews, MD  Cardiologist: Ena Dawley, MD  No chief complaint on file.   History of Present Illness:  [name] [name] is a 72 y.o. female with history of nonobstructive CAD on cath in 2005 managed medically.  Negative nuclear stress test 2015, 2D echo 02/2017 normal LVEF 65-70%, mild DD mild AI and mild MR, mild TR with mildly elevated pulmonary pressure.  Also has hypertension, HLD, DM, PVD.  Last seen by Dr. Meda [name] 08/2016.  Admission 02/2017 for acute metabolic encephalopathy with hypovolemic shock and AK I requiring intubation.  All of f her antihypertensive were stopped in the hospital and were gradually added back as she was followed by our hypertensive clinic and BP controlled with atenolol 100 mg daily amlodipine 5 mg daily.  Patient comes in today for routine f/u. She feels well and denies cardiac complaints. Bothered mostly by arthritis and walks with a cane. No chest pain,dyspnea, dizziness or presyncope.  Past Medical History:  Diagnosis Date  . Anemia   . Arthritis   . CAD 08/26/2006   Cath 07/05:multiple areas of nonobstructive disease = medical & RF mgmt, well preserved global systolic function. Dr. Lia Foyer. Nuclear med stress test 01/2014 : Normal stress nuclear study. LV Ejection Fraction: 76%. LV Wall Motion: NL LV Function; NL Wall Motion.      . Chronic pain syndrome 01/03/2013   Multifactorial. Non opioid requiring.   L2-5 fusion, with hardware at L2-3, stable per MRI 2010. Followed by neurosurgery (Dr. 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    [name] DEPRESSION 08/26/2006   On paxil    . DIABETES MELLITUS, TYPE II 08/26/2006   Insulin dependent. Victoza added 03/2014  . DYSLIPIDEMIA 11/01/2006   LDL goal < 100, 70 if can achieve without side effects.     . Gallstones   . GERD  08/26/2006   Heartburn QHS.     [name] HYPERTENSION 08/26/2006   On BB, ACEI, lasix, norvasc, metolazone, and imdur.     . OBESITY 08/26/2006   BMI 39.5. Obesity Class 2.     . PVD (peripheral vascular disease) ([place]) 03/27/2014   2015 LE arterial Duplex - Possible mild inflow disease on the left. 0-49% bilateral SFA disease, without focal stenosis. Three vesel run-off, bilaterally. Medical tx.     Past Surgical History:  Procedure Laterality Date  . CHOLECYSTECTOMY     pt denies 04/07/16  . endometrial biospy    . LEFT HEART CATH    . lumbar fusion surgery  10/08   L3-L5  . posterior lumbar interfusion surgery  07/18/07   L2-L3  . rotator cuff surgery Bilateral     Current Medications: Current Meds  Medication Sig  . ACCU-CHEK FASTCLIX LANCETS MISC Check blood sugar 3 times a day  . aspirin 81 MG tablet Take 1 tablet (81 mg total) by mouth daily.  [name] atenolol (TENORMIN) 100 MG tablet Take 1 tablet (100 mg total) by mouth daily.  [name] atorvastatin (LIPITOR) 10 MG tablet Take 1 tablet (10 mg total) by mouth daily.  . Blood Glucose Monitoring Suppl (ACCU-CHEK GUIDE) w/Device KIT 1 each by Does not apply route 3 (three) times daily.  . furosemide (LASIX) 40 MG tablet take 1 tablet by mouth once daily  . glucose blood (ACCU-CHEK GUIDE) test strip Check  blood sugar 3 times a day  . Insulin Glargine (LANTUS SOLOSTAR) 100 UNIT/ML Solostar Pen Inject 12 Units into the skin daily at 10 pm.  . isosorbide mononitrate (IMDUR) 30 MG 24 hr tablet Take 1 tablet (30 mg total) by mouth daily.  [name] liraglutide (VICTOZA) 18 MG/3ML SOPN inject 1.2 milligram subcutaneously daily  . loratadine (CLARITIN) 10 MG tablet Take 1 tablet (10 mg total) by mouth daily.  [name] LYRICA 150 MG capsule take 1 capsule by mouth once daily  . nitroGLYCERIN (NITROSTAT) 0.4 MG SL tablet Place 1 tablet (0.4 mg total) under the tongue every 5 (five) minutes as needed for chest pain.  [name] PARoxetine (PAXIL) 40 MG tablet Take 1 tablet (40 mg  total) by mouth every morning.  . ranitidine (ZANTAC) 150 MG tablet Take 1 tablet (150 mg total) by mouth daily.  . traMADol (ULTRAM) 50 MG tablet Take 1 tablet (50 mg total) by mouth 2 (two) times daily.  . vitamin B-12 (CYANOCOBALAMIN) 1000 MCG tablet Take 1,000 mcg by mouth daily.     Allergies:   Patient has no known allergies.   Social History   Socioeconomic History  . Marital status: Single    Spouse name: None  . Number of children: 2  . Years of education: None  . Highest education level: None  Social Needs  . Financial resource strain: None  . Food insecurity - worry: None  . Food insecurity - inability: None  . Transportation needs - medical: None  . Transportation needs - non-medical: None  Occupational History  . None  Tobacco Use  . Smoking status: Passive Smoke Exposure - Never Smoker  . Smokeless tobacco: Never Used  Substance and Sexual Activity  . Alcohol use: No    Alcohol/week: 0.0 oz  . Drug use: No  . Sexual activity: None  Other Topics Concern  . 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 Has two daughters and several grand kids. Severe limitation in ambulation. Occ goes out to church but usually daughter gets groceries.    She is one of 7 kids and the oldest so helped raise the other 6.     Family History:  The patient's family history includes Cirrhosis in her mother; Diabetes in her father and sister; Hyperlipidemia in her sister; Hypertension in her father and sister.   ROS:   Please see the history of present illness.    Review of Systems  Constitution: Negative.  HENT: Negative.   Eyes: Negative.   Cardiovascular: Negative.   Respiratory: Negative.   Hematologic/Lymphatic: Negative.   Musculoskeletal: Positive for arthritis, joint pain, joint swelling and stiffness.  Gastrointestinal: Negative.   Genitourinary: Negative.   Neurological: Negative.    All other systems reviewed and are  negative.   PHYSICAL EXAM:   VS:  BP 138/78   Pulse 78   Ht _0  (1.626 m)   Wt 214 lb (97.1 kg)   SpO2 98%   BMI 36.73 kg/m   Physical Exam  GEN: Obese, in no acute distress  Neck: no JVD, carotid bruits, or masses Cardiac:RRR; distant heart sounds, 1/6 systolic murmur at the left sternal border Respiratory:  clear to auscultation bilaterally, normal work of breathing GI: soft, nontender, nondistended, + BS Ext: without cyanosis, clubbing, or edema, Good distal pulses bilaterally Neuro:  Alert and Oriented x 3 Psych: euthymic mood, full affect  Wt Readings from Last 3 Encounters:  11/23/17 214 lb (97.1  kg)  11/18/17 216 lb 8 oz (98.2 kg)  10/20/17 217 lb (98.4 kg)      Studies/Labs Reviewed:   EKG:  EKG is not ordered today.   Recent Labs: 02/24/2017: TSH 0.601 03/04/2017: Magnesium 1.3 05/10/2017: ALT 5 09/06/2017: Hemoglobin 12.2; Platelets 304.0 11/18/2017: BUN 13; Creatinine, Ser 0.87; Potassium 4.6; Sodium 142   Lipid Panel    Component Value Date/Time   CHOL 130 05/10/2017 0904   TRIG 73 05/10/2017 0904   HDL 58 05/10/2017 0904   CHOLHDL 2.2 05/10/2017 0904   CHOLHDL 1.9 12/30/2015 0832   VLDL 12 12/30/2015 0832   LDLCALC 57 05/10/2017 0904    Additional studies/ records that were reviewed today include:   The echo 5/2018Study Conclusions   - Left ventricle: The cavity size was normal. Wall thickness was   normal. Systolic function was vigorous. The estimated ejection   fraction was in the range of 65% to 70%. Wall motion was normal;   there were no regional wall motion abnormalities. Doppler   parameters are consistent with abnormal left ventricular   relaxation (grade 1 diastolic dysfunction). - Aortic valve: There was mild regurgitation. - Mitral valve: There was mild regurgitation. - Pulmonary arteries: Systolic pressure was mildly increased.   Impressions:   - Vigorous LV systolic function; mild diastolic dysfunction;   sclerotic aortic valve  with mild AI; mild MR; mild TR with mildly   elevated pulmonary pressure.      ASSESSMENT:    1. Atherosclerosis of native coronary artery of native heart without angina pectoris   2. Essential hypertension   3. Diastolic CHF, chronic ([place])   4. PVD (peripheral vascular disease) ([place])   5. Mixed hyperlipidemia   6. Class 2 severe obesity due to excess calories with serious comorbidity and body mass index (BMI) of 36.0 to 36.9 in adult [facility])      PLAN:  In order of problems listed above:  CAD nonobstructive in 2015 without angina.  Follow-up with Dr. Meda [name] in 1 year  Hypertension much better controlled since she is back on her medications.  Renal profile checked this month and was normal.  Chronic diastolic CHF stable without heart failure on exam  PVD last lower extremity serial Dopplers in 2016 actually improved and in normal range  Hyperlipidemia on Lipitor- lipids 04/2017 at goal  Obesity weight loss certainly would help but she is unable to exercise because of arthritis.  Medication Adjustments/Labs and Tests Ordered: Current medicines are reviewed at length with the patient today.  Concerns regarding medicines are outlined above.  Medication changes, Labs and Tests ordered today are listed in the Patient Instructions below. There are no Patient Instructions on file for this visit.   Sumner Boast, PA-C  11/23/2017 9:19 AM    [facility] [address], [place], [place]  [address] Phone: [phone]; Fax: [phone]

## 2017-11-23 ENCOUNTER — Ambulatory Visit (INDEPENDENT_AMBULATORY_CARE_PROVIDER_SITE_OTHER): Payer: Medicare Other | Admitting: Physician Assistant

## 2017-11-23 ENCOUNTER — Encounter: Payer: Self-pay | Admitting: Physician Assistant

## 2017-11-23 VITALS — BP 138/78 | HR 78 | Ht 64.0 in | Wt 214.0 lb

## 2017-11-23 DIAGNOSIS — E782 Mixed hyperlipidemia: Secondary | ICD-10-CM | POA: Diagnosis not present

## 2017-11-23 DIAGNOSIS — I2510 Atherosclerotic heart disease of native coronary artery without angina pectoris: Secondary | ICD-10-CM

## 2017-11-23 DIAGNOSIS — I739 Peripheral vascular disease, unspecified: Secondary | ICD-10-CM | POA: Diagnosis not present

## 2017-11-23 DIAGNOSIS — Z6836 Body mass index (BMI) 36.0-36.9, adult: Secondary | ICD-10-CM

## 2017-11-23 DIAGNOSIS — I5032 Chronic diastolic (congestive) heart failure: Secondary | ICD-10-CM

## 2017-11-23 DIAGNOSIS — I10 Essential (primary) hypertension: Secondary | ICD-10-CM | POA: Diagnosis not present

## 2017-11-23 NOTE — Patient Instructions (Signed)
Medication Instructions:  Your provider recommends that you continue on your current medications as directed. Please refer to the Current Medication list given to you today.    Labwork: None  Testing/Procedures: None  Follow-Up: Your provider wants you to follow-up in: 1 year with Dr. Meda Coffee. You will receive a reminder letter in the mail two months in advance. If you don't receive a letter, please call our office to schedule the follow-up appointment.    Any Other Special Instructions Will Be Listed Below (If Applicable).     If you need a refill on your cardiac medications before your next appointment, please call your pharmacy.

## 2017-11-29 ENCOUNTER — Ambulatory Visit (INDEPENDENT_AMBULATORY_CARE_PROVIDER_SITE_OTHER): Payer: Medicare Other | Admitting: Sports Medicine

## 2017-11-29 ENCOUNTER — Other Ambulatory Visit: Payer: Self-pay | Admitting: Sports Medicine

## 2017-11-29 VITALS — Ht 63.0 in | Wt 214.0 lb

## 2017-11-29 DIAGNOSIS — M79602 Pain in left arm: Secondary | ICD-10-CM

## 2017-11-29 DIAGNOSIS — M79601 Pain in right arm: Secondary | ICD-10-CM

## 2017-11-29 DIAGNOSIS — M4802 Spinal stenosis, cervical region: Secondary | ICD-10-CM

## 2017-11-29 NOTE — Progress Notes (Signed)
   Subjective:    Patient ID: MAGHAN JESSEE, female    DOB: [birth year], 72 y.o.   MRN: [id number]  HPI chief complaint: Neck pain and bilateral shoulder pain  72 year old female comes in today with returning bilateral shoulder pain. She has a history of cervical spinal stenosis. She received intermittent cervical ESI's which are helpful. Last injection was in August. She denies any recent trauma. She does get numbness and tingling down both arms. Symptoms are identical in nature to what she has experienced previously. She takes tramadol as needed for pain which does help.    Review of Systems    as above Objective:   Physical Exam  Well-developed, well-nourished. No acute distress. Awake alert and oriented 3. Vital signs reviewed  Cervical spine: Limited range of motion in all planes. No tenderness to palpation along the cervical midline. Diffuse tenderness along the paraspinal musculature.  Neurological exam: Good strength. No atrophy. Sensation is intact to light touch grossly.      Assessment & Plan:   Cervical spinal stenosis  Patient will be referred back over to [facility] imaging for repeat cervical ESI. Patient will follow-up with me if symptoms persist thereafter. Otherwise, follow-up as needed.

## 2017-11-29 NOTE — Patient Instructions (Signed)
Next Neck injection will be:  Next Thursday 2.28.19 @ 1215pm Dr Jobe Igo [facility] [place] [phone]  *No food after 830am Thursday   *You can drink water with your medications  *Must bring a driver

## 2017-11-30 ENCOUNTER — Telehealth: Payer: Self-pay | Admitting: Dietician

## 2017-11-30 NOTE — Telephone Encounter (Signed)
Calling to follow up on metformin restart to see how her blood sugars are doing, tolerance, confirm dose. Left message for a return call.

## 2017-12-07 ENCOUNTER — Encounter: Payer: Self-pay | Admitting: Dietician

## 2017-12-08 NOTE — Telephone Encounter (Signed)
Unable to reach Ms. Langelier by phone. Home phone mailbox is full and mobile goes directly to voicemail. Left several messages without a return call.

## 2017-12-09 ENCOUNTER — Ambulatory Visit
Admission: RE | Admit: 2017-12-09 | Discharge: 2017-12-09 | Disposition: A | Discharge status: Home / Self Care | Payer: Medicare Other | Source: Ambulatory Visit | Attending: Sports Medicine | Admitting: Sports Medicine

## 2017-12-09 DIAGNOSIS — M79601 Pain in right arm: Secondary | ICD-10-CM

## 2017-12-09 DIAGNOSIS — M79602 Pain in left arm: Principal | ICD-10-CM

## 2017-12-09 MED ORDER — TRIAMCINOLONE ACETONIDE 40 MG/ML IJ SUSP (RADIOLOGY)
60.0000 mg | Freq: Once | INTRAMUSCULAR | Status: AC
  Administered 2017-12-09: 60 mg via EPIDURAL

## 2017-12-09 MED ORDER — IOPAMIDOL (ISOVUE-M 300) INJECTION 61%
1.0000 mL | Freq: Once | INTRAMUSCULAR | Status: AC | PRN
  Administered 2017-12-09: 1 mL via EPIDURAL

## 2017-12-09 NOTE — Discharge Instructions (Signed)

## 2017-12-13 ENCOUNTER — Other Ambulatory Visit: Payer: Self-pay | Admitting: Dietician

## 2017-12-13 DIAGNOSIS — IMO0002 Reserved for concepts with insufficient information to code with codable children: Secondary | ICD-10-CM

## 2017-12-13 DIAGNOSIS — E1151 Type 2 diabetes mellitus with diabetic peripheral angiopathy without gangrene: Secondary | ICD-10-CM

## 2017-12-13 DIAGNOSIS — E1165 Type 2 diabetes mellitus with hyperglycemia: Secondary | ICD-10-CM

## 2017-12-13 MED ORDER — INSULIN PEN NEEDLE 32G X 4 MM MISC: 5 refills | Status: DC

## 2017-12-13 NOTE — Telephone Encounter (Signed)
Pt requests refill on pen needles ASAP

## 2017-12-13 NOTE — Telephone Encounter (Signed)
[name] returned calls today: She is taking a half of pill of metformin and tolerating it, advised she increase per Dr. Zenovia Jarred note 11/18/17 to a whole pill (1000 mg) twice a day She reports her blood sugars are as follows: Date  Breakfast Lunch  Dinner 2/25 158  126  142 2/26 167  153  126 2/27 182  171  164 2/28 178  195  145 3/1 124  166  158 3/2 204  142  124 3/3 206  185  154 3/4 226  I asked her why she thought her blood sugars appear to be a bit higher in weekends and she said maybe her activity is decreased. She requests refill on pen needles

## 2017-12-13 NOTE — Telephone Encounter (Signed)
[name] [name] Would you enter proper pen needles and send back to me? Thanks

## 2017-12-26 ENCOUNTER — Other Ambulatory Visit: Payer: Self-pay | Admitting: Internal Medicine

## 2018-01-01 IMAGING — XA DG INJECT/[PERSON_NAME] INC NEEDLE/CATH/PLC EPI/CERV/THOR W/IMG
1 series · 1 of 1 positions shown · non-contrast
Comparison: none

CLINICAL DATA: Spondylosis without myelopathy. Symptoms more
pronounced on the right presently. Good response to previous
left-sided injection.

[Series 1: ortho standard · 1 of 1 slices shown]
[im 1/1]
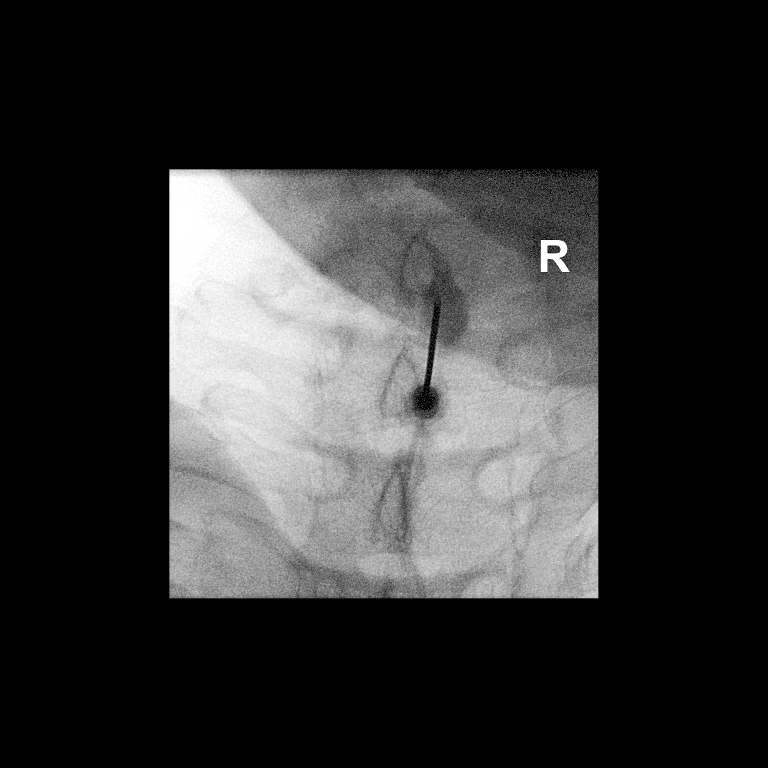

[1 of 1 positions shown; findings below may reference images not displayed]

FLUOROSCOPY TIME:  0 minutes 35 seconds. 17.69 micro gray meter
squared

PROCEDURE:
CERVICAL EPIDURAL INJECTION

An interlaminar approach was performed on the right at C7-T1 . A 20
gauge epidural needle was advanced using loss-of-resistance
technique.

DIAGNOSTIC EPIDURAL INJECTION

Injection of Isovue-M 300 shows a good epidural pattern with spread
above and below the level of needle placement, primarily on the
right. No vascular opacification is seen. THERAPEUTIC

EPIDURAL INJECTION

1.5 ml of Kenalog 40 mixed with 1 ml of 1% Lidocaine and 2 ml of
normal saline were then instilled. The procedure was well-tolerated,
and the patient was discharged thirty minutes following the
injection in good condition.
IMPRESSION: Technically successful repeat epidural injection on the right at
C7-T1.

## 2018-01-26 ENCOUNTER — Emergency Department (HOSPITAL_COMMUNITY)
Admission: EM | Admit: 2018-01-26 | Discharge: 2018-01-26 | Disposition: A | Discharge status: Home / Self Care | Payer: Medicare Other | Attending: Emergency Medicine | Admitting: Emergency Medicine

## 2018-01-26 ENCOUNTER — Other Ambulatory Visit: Payer: Self-pay

## 2018-01-26 ENCOUNTER — Encounter (HOSPITAL_COMMUNITY): Payer: Self-pay | Admitting: Emergency Medicine

## 2018-01-26 ENCOUNTER — Emergency Department (HOSPITAL_COMMUNITY): Payer: Medicare Other

## 2018-01-26 DIAGNOSIS — N183 Chronic kidney disease, stage 3 (moderate): Secondary | ICD-10-CM | POA: Insufficient documentation

## 2018-01-26 DIAGNOSIS — I2510 Atherosclerotic heart disease of native coronary artery without angina pectoris: Secondary | ICD-10-CM | POA: Diagnosis not present

## 2018-01-26 DIAGNOSIS — Z7982 Long term (current) use of aspirin: Secondary | ICD-10-CM | POA: Insufficient documentation

## 2018-01-26 DIAGNOSIS — Z794 Long term (current) use of insulin: Secondary | ICD-10-CM | POA: Insufficient documentation

## 2018-01-26 DIAGNOSIS — I110 Hypertensive heart disease with heart failure: Secondary | ICD-10-CM | POA: Diagnosis not present

## 2018-01-26 DIAGNOSIS — R079 Chest pain, unspecified: Secondary | ICD-10-CM | POA: Diagnosis not present

## 2018-01-26 DIAGNOSIS — Z7722 Contact with and (suspected) exposure to environmental tobacco smoke (acute) (chronic): Secondary | ICD-10-CM | POA: Diagnosis not present

## 2018-01-26 DIAGNOSIS — M542 Cervicalgia: Secondary | ICD-10-CM | POA: Diagnosis not present

## 2018-01-26 DIAGNOSIS — M25511 Pain in right shoulder: Secondary | ICD-10-CM | POA: Insufficient documentation

## 2018-01-26 DIAGNOSIS — R072 Precordial pain: Secondary | ICD-10-CM | POA: Diagnosis not present

## 2018-01-26 DIAGNOSIS — E1122 Type 2 diabetes mellitus with diabetic chronic kidney disease: Secondary | ICD-10-CM | POA: Insufficient documentation

## 2018-01-26 DIAGNOSIS — I5032 Chronic diastolic (congestive) heart failure: Secondary | ICD-10-CM | POA: Insufficient documentation

## 2018-01-26 LAB — CBC
HCT: 34 % — ABNORMAL LOW (ref 36.0–46.0)
Hemoglobin: 11.8 g/dL — ABNORMAL LOW (ref 12.0–15.0)
MCH: 29.6 pg (ref 26.0–34.0)
MCHC: 34.7 g/dL (ref 30.0–36.0)
MCV: 85.4 fL (ref 78.0–100.0)
Platelets: 318 10*3/uL (ref 150–400)
RBC: 3.98 MIL/uL (ref 3.87–5.11)
RDW: 15.8 % — ABNORMAL HIGH (ref 11.5–15.5)
WBC: 8.7 10*3/uL (ref 4.0–10.5)

## 2018-01-26 LAB — BASIC METABOLIC PANEL
Anion gap: 10 (ref 5–15)
BUN: 21 mg/dL — ABNORMAL HIGH (ref 6–20)
CO2: 29 mmol/L (ref 22–32)
Calcium: 9.2 mg/dL (ref 8.9–10.3)
Chloride: 102 mmol/L (ref 101–111)
Creatinine, Ser: 1.05 mg/dL — ABNORMAL HIGH (ref 0.44–1.00)
GFR calc Af Amer: 60 mL/min — ABNORMAL LOW (ref >60)
GFR calc non Af Amer: 52 mL/min — ABNORMAL LOW (ref >60)
Glucose, Bld: 211 mg/dL — ABNORMAL HIGH (ref 65–99)
Potassium: 4.3 mmol/L (ref 3.5–5.1)
Sodium: 141 mmol/L (ref 135–145)

## 2018-01-26 LAB — I-STAT TROPONIN, ED: Troponin i, poc: 0 ng/mL (ref 0.00–0.08)

## 2018-01-26 NOTE — Discharge Instructions (Addendum)
Your blood work, x-ray and EKG were reassuring today.  You can continue taking tramadol at home for your shoulder pain, also you can take Tylenol.  Please put heat over the shoulder to help with your symptoms.  Please schedule an appointment with your regular doctor for ongoing management of your shoulder pain.  Your blood sugar was elevated in the ER today, please have this rechecked with your regular doctor.  Return to the emergency department if you have any new or concerning symptoms like trouble breathing, chest pain, feeling weak or lightheaded.

## 2018-01-26 NOTE — ED Provider Notes (Signed)
[facility] EMERGENCY DEPARTMENT Provider Note   CSN: [id number] Arrival date & time: 01/26/18  1030     History   Chief Complaint Chief Complaint  Patient presents with  . Chest Pain    HPI [name] [name] is a 72 y.o. female.  HPI   [name] [name] is a 72yo female with a history of diastolic CHF, nonobstructive CAD (normal stress test May, 2015), type 2 diabetes, HTN, HLD, PVD, obesity, chronic back pain, DJD, bilateral rotator cuff surgery who presents to the emergency department for evaluation of right neck and shoulder pain.  Patient reports that her symptoms began three days ago and have progressively worsened.  States that she has constant 10/10 severity right neck pain which radiates to the right shoulder and into the right elbow.  She reports the pain also radiates to the middle right clavicle, denies chest pain.  Pain is worsened with shoulder movement or neck rotation.  She tried taking tramadol last night without significant improvement.  States that she had similar symptoms in the past and was hospitalized for "kidney problem."  Denies recent injury to the shoulder. States she did trip and fall on her back at her dentist's office a week or two ago.  She reports that she has chronic tingling sensation in bilateral fingers, but denies any new numbness recently.  She denies fever, chills, weakness, rash, headache, chest pain, SOB, lightheadedness, dizziness, diaphoresis, n/v, abdominal pain, dysuria, urinary frequency. Denies prior neck surgeries. She is established with [facility].   Past Medical History:  Diagnosis Date  . Anemia   . Arthritis   . CAD 08/26/2006   Cath 07/05:multiple areas of nonobstructive disease = medical & RF mgmt, well preserved global systolic function. Dr. Lia Foyer. Nuclear med stress test 01/2014 : Normal stress nuclear study. LV Ejection Fraction: 76%. LV Wall Motion: NL LV Function; NL Wall Motion.      . Chronic  pain syndrome 01/03/2013   Multifactorial. Non opioid requiring.   L2-5 fusion, with hardware at L2-3, stable per MRI 2010. Followed by neurosurgery (Dr. 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    [name] DEPRESSION 08/26/2006   On paxil    . DIABETES MELLITUS, TYPE II 08/26/2006   Insulin dependent. Victoza added 03/2014  . DYSLIPIDEMIA 11/01/2006   LDL goal < 100, 70 if can achieve without side effects.     . Gallstones   . GERD 08/26/2006   Heartburn QHS.     [name] HYPERTENSION 08/26/2006   On BB, ACEI, lasix, norvasc, metolazone, and imdur.     . OBESITY 08/26/2006   BMI 39.5. Obesity Class 2.     . PVD (peripheral vascular disease) ([place]) 03/27/2014   2015 LE arterial Duplex - Possible mild inflow disease on the left. 0-49% bilateral SFA disease, without focal stenosis. Three vesel run-off, bilaterally. Medical tx.     Patient Active Problem List   Diagnosis Date Noted  . Aortic atherosclerosis ([place]) 06/17/2017  . 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 Right carotid bruit 04/08/2017  . CKD stage 3 due to type 2 diabetes mellitus ([place]) 12/11/2016  . Diabetic neuropathy ([place]) 12/10/2016  . Small intestinal bacterial overgrowth 12/24/2015  . Goals of care, counseling/discussion 01/18/2015  . ASCUS with positive high risk HPV 08/03/2014  . PVD (peripheral vascular disease) ([place]) 03/27/2014  . Diastolic CHF, chronic ([place]) 12/21/2013  . Healthcare maintenance 04/25/2013  .  Chronic pain syndrome 01/03/2013  . Insomnia 12/22/2011  . Vitamin B12 deficiency 12/27/2009  . Vitamin D deficiency 12/27/2009  . Anemia 12/27/2009  . Hypomagnesemia 12/29/2008  . Osteopenia 08/01/2008  . Hyperlipidemia 11/01/2006  . GLAUCOMA NOS 11/01/2006  . DM (diabetes mellitus), type 2, uncontrolled, periph vascular complic ([place]) 44/96/7591  . Obesity 08/26/2006  . Depression 08/26/2006  . HTN (hypertension) 08/26/2006  . Coronary  atherosclerosis 08/26/2006  . GERD 08/26/2006    Past Surgical History:  Procedure Laterality Date  . CHOLECYSTECTOMY     pt denies 04/07/16  . endometrial biospy    . LEFT HEART CATH    . lumbar fusion surgery  10/08   L3-L5  . posterior lumbar interfusion surgery  07/18/07   L2-L3  . rotator cuff surgery Bilateral      OB History   None      Home Medications    Prior to Admission medications   Medication Sig Start Date End Date Taking? Authorizing Provider  ACCU-CHEK FASTCLIX LANCETS MISC Check blood sugar 3 times a day 08/10/17   Bartholomew Crews, MD  amLODipine (NORVASC) 5 MG tablet Take 1 tablet (5 mg total) by mouth daily. 04/20/17 11/18/17  Consuelo Pandy, PA-C  aspirin 81 MG tablet Take 1 tablet (81 mg total) by mouth daily. 04/25/13   Bartholomew Crews, MD  atenolol (TENORMIN) 100 MG tablet Take 1 tablet (100 mg total) by mouth daily. 06/01/17   Dorothy Spark, MD  atorvastatin (LIPITOR) 10 MG tablet Take 1 tablet (10 mg total) by mouth daily. 04/08/17   Bartholomew Crews, MD  Blood Glucose Monitoring Suppl (ACCU-CHEK GUIDE) w/Device KIT 1 each by Does not apply route 3 (three) times daily. 08/10/17   Bartholomew Crews, MD  furosemide (LASIX) 40 MG tablet take 1 tablet by mouth once daily 10/04/17   Dorothy Spark, MD  glucose blood (ACCU-CHEK GUIDE) test strip Check blood sugar 3 times a day 08/10/17   Bartholomew Crews, MD  Insulin Glargine (LANTUS SOLOSTAR) 100 UNIT/ML Solostar Pen Inject 12 Units into the skin daily at 10 pm.    [provider]  Insulin Pen Needle 32G X 4 MM MISC Use to inject victoza one time a day and lantus one time a day 12/13/17   Bartholomew Crews, MD  isosorbide mononitrate (IMDUR) 30 MG 24 hr tablet Take 1 tablet (30 mg total) by mouth daily. 04/08/17   Bartholomew Crews, MD  liraglutide (VICTOZA) 18 MG/3ML SOPN INJECT 1.2 MILLIGRAM SUBCUTANEOUSLY DAILY 12/27/17   Bartholomew Crews, MD  loratadine  (CLARITIN) 10 MG tablet Take 1 tablet (10 mg total) by mouth daily. 11/18/17   Bartholomew Crews, MD  LYRICA 150 MG capsule take 1 capsule by mouth once daily 08/30/17   Bartholomew Crews, MD  nitroGLYCERIN (NITROSTAT) 0.4 MG SL tablet Place 1 tablet (0.4 mg total) under the tongue every 5 (five) minutes as needed for chest pain. 08/12/16   Dorothy Spark, MD  PARoxetine (PAXIL) 40 MG tablet Take 1 tablet (40 mg total) by mouth every morning. 04/08/17   Bartholomew Crews, MD  ranitidine (ZANTAC) 150 MG tablet Take 1 tablet (150 mg total) by mouth daily. 03/04/17   Rosita Fire, MD  traMADol (ULTRAM) 50 MG tablet Take 1 tablet (50 mg total) by mouth 2 (two) times daily. 11/15/17   Thurman Coyer, DO  vitamin B-12 (CYANOCOBALAMIN) 1000 MCG tablet Take 1,000 mcg by mouth  daily.    [provider]    Family History Family History  Problem Relation Age of Onset  . Cirrhosis Mother   . Diabetes Father   . Hypertension Father   . Hypertension Sister   . Diabetes Sister   . Hyperlipidemia Sister   . Colon cancer Neg Hx   . Throat cancer Neg Hx     Social History Social History   Tobacco Use  . Smoking status: Passive Smoke Exposure - Never Smoker  . Smokeless tobacco: Never Used  Substance Use Topics  . Alcohol use: No    Alcohol/week: 0.0 oz  . Drug use: No     Allergies   Patient has no known allergies.   Review of Systems Review of Systems  Constitutional: Negative for chills and fever.  Eyes: Negative for visual disturbance.  Respiratory: Negative for shortness of breath.   Cardiovascular: Negative for chest pain.  Gastrointestinal: Negative for abdominal pain, nausea and vomiting.  Genitourinary: Negative for difficulty urinating, dysuria, flank pain and frequency.  Musculoskeletal: Positive for arthralgias (right shoulder pain) and neck pain (right sided neck pain). Negative for back pain, gait problem and joint swelling.  Skin: Negative for  color change and rash.  Neurological: Positive for numbness (chronic tingling in bilateral hands, reports unchanged from previous). Negative for dizziness, weakness, light-headedness and headaches.  Psychiatric/Behavioral: Negative for agitation.     Physical Exam Updated Vital Signs BP 121/77   Pulse 83   Temp 99 F (37.2 C) (Oral)   Resp 15   SpO2 96%   Physical Exam  Constitutional: She appears well-developed and well-nourished. No distress.  Sitting at bedside in no apparent distress.  Nontoxic-appearing.  HENT:  Head: Normocephalic and atraumatic.  Mouth/Throat: Oropharynx is clear and moist. No oropharyngeal exudate.  Eyes: Pupils are equal, round, and reactive to light. Conjunctivae are normal. Right eye exhibits no discharge. Left eye exhibits no discharge.  Neck: Normal range of motion. Neck supple. No JVD present. No tracheal deviation present.  Tender to palpation over right paraspinal muscles of the cervical spine. No midline c-spine tenderness. Full neck ROM, although patient reports pain with right neck rotation.   Cardiovascular: Normal rate, regular rhythm and intact distal pulses. Exam reveals no friction rub.  No murmur heard. Pulmonary/Chest: Effort normal and breath sounds normal. No stridor. No respiratory distress. She has no wheezes. She has no rales.  Abdominal: Soft. Bowel sounds are normal. There is no tenderness. There is no guarding.  Musculoskeletal:  Right shoulder with tenderness to palpation grossly over clavicle, scapular spine and head of humerus. Limited flexion, extension and abduction due to pain. Negative empty can test. No swelling, erythema or ecchymosis present. No step-off, crepitus, or deformity appreciated. 5/5 muscle strength of UE. 2+ radial pulse. Distal sensation to light touch intact in bilateral upper extremities. No calf tenderness or leg swelling.  Neurological: She is alert. Coordination normal.  Skin: Skin is dry. Capillary refill  takes less than 2 seconds. She is not diaphoretic.  Psychiatric: She has a normal mood and affect. Her behavior is normal.  Nursing note and vitals reviewed.    ED Treatments / Results  Labs (all labs ordered are listed, but only abnormal results are displayed) Labs Reviewed  BASIC METABOLIC PANEL - Abnormal; Notable for the following components:      Result Value   Glucose, Bld 211 (*)    BUN 21 (*)    Creatinine, Ser 1.05 (*)    GFR  calc non Af Amer 52 (*)    GFR calc Af Amer 60 (*)    All other components within normal limits  CBC - Abnormal; Notable for the following components:   Hemoglobin 11.8 (*)    HCT 34.0 (*)    RDW 15.8 (*)    All other components within normal limits  I-STAT TROPONIN, ED    EKG EKG Interpretation  Date/Time:  Wednesday January 26 2018 10:40:47 EDT Ventricular Rate:  90 PR Interval:  180 QRS Duration: 76 QT Interval:  366 QTC Calculation: 447 R Axis:   1 Text Interpretation:  Normal sinus rhythm Normal ECG Confirmed by Nat Christen [phone]) on 01/26/2018 12:29:06 PM   Radiology Dg Chest 2 View  Result Date: 01/26/2018 CLINICAL DATA:  Sharp substernal chest pain since yesterday, history hypertension, coronary artery disease EXAM: CHEST - 2 VIEW COMPARISON:  03/09/2017 FINDINGS: Upper normal size of cardiac silhouette with slight pulmonary vascular congestion. Mediastinal contours normal. Bibasilar atelectasis. Lungs otherwise clear. No definite infiltrate, pleural effusion or pneumothorax. Eventration of the anterior RIGHT diaphragm unchanged. Bones demineralized with mild superior endplate compression deformity of a mid to lower thoracic vertebral body, approximately T6, new, with mild anterior height loss. Subtle superior endplate compression deformity of T8 vertebral body, unchanged. Prior lumbar fusion. IMPRESSION: Mild pulmonary vascular congestion without acute infiltrate. Minimal bibasilar atelectasis. New approximately T6 compression fracture  with mild anterior height loss. Electronically Signed   By: Lavonia Dana M.D.   On: 01/26/2018 11:10    Procedures Procedures (including critical care time)  Medications Ordered in ED Medications - No data to display   Initial Impression / Assessment and Plan / ED Course  I have reviewed the triage vital signs and the nursing notes.  Pertinent labs & imaging results that were available during my care of the patient were reviewed by me and considered in my medical decision making (see chart for details).     Presents with right neck and shoulder pain, denies prior injury. Had a bilateral rotator cuff surgeries in the past. Denies CP, SOB, n/v, diaphoresis. VSS. On exam she has reproducible tenderness over the right paraspinal muscles of the cervical spine and over the distal right clavicle and humeral head. Pain worsened with shoulder movement. Pain is likely musculoskeletal given reproducible to palpation and worsened with movement but given her ACS risk factors cardiac work up initiated.   Results reviewed. Do not suspect ACS given troponin negative and symptoms have been constant for three days, EKG non-ischemic. CXR without acute abnormality. CBC unremarkable. BMP reveals elevated glucose (bg 211), patient known diabetic. Informed her that she will need to have further blood glucose with her PCP. Creatinine 1.05 which is near her baseline 1.0.   Patient has been counseled to follow up with her PCP regarding her ER visit today. She has been counseled on RICE protocol and tylenol for pain. Discussed reasons to return to the ER. Patient agrees and voices understanding to the above plan. Discussed this patient with Dr. Lacinda Axon who also saw the patient and agrees with plan and discharge home.   Final Clinical Impressions(s) / ED Diagnoses   Final diagnoses:  Acute pain of right shoulder    ED Discharge Orders    None       Bernarda Caffey 01/27/18 1139    Nat Christen,  MD 01/27/18 1718

## 2018-01-26 NOTE — ED Triage Notes (Signed)
Pt reports substernal CP beginning yesterday while lying on the couch. Describes pain as sharp. Denies SOB, weakness, dizziness. Skin warm, dry. VSS.

## 2018-01-26 NOTE — ED Notes (Signed)
Family at bedside. 

## 2018-01-31 ENCOUNTER — Ambulatory Visit: Payer: Medicare Other | Admitting: Sports Medicine

## 2018-03-02 ENCOUNTER — Telehealth: Payer: Self-pay

## 2018-03-02 DIAGNOSIS — D649 Anemia, unspecified: Secondary | ICD-10-CM

## 2018-03-02 NOTE — Telephone Encounter (Signed)
Called pt. She is due for cbc.  Labs entered.  She will go to lab one day next week.

## 2018-03-02 NOTE — Telephone Encounter (Signed)
-----   Message from Roetta Sessions, [place] sent at 09/07/2017  1:31 PM EST ----- Regarding: repeat cbc due Jan can you please let this patient know her Hgb is NORMAL at this time. Her prior iron studies were normal, has not been on iron. I think prior anemia due to AKI and hospitalization, not GI blood loss. We can repeat CBC in 6 months. She is already on recall for colonoscopy in 2020. Thanks

## 2018-03-03 ENCOUNTER — Encounter: Payer: Self-pay | Admitting: Internal Medicine

## 2018-03-03 ENCOUNTER — Encounter: Payer: Self-pay | Admitting: *Deleted

## 2018-03-03 ENCOUNTER — Other Ambulatory Visit: Payer: Self-pay

## 2018-03-03 ENCOUNTER — Ambulatory Visit (INDEPENDENT_AMBULATORY_CARE_PROVIDER_SITE_OTHER): Payer: Medicare Other | Admitting: Internal Medicine

## 2018-03-03 VITALS — BP 155/66 | HR 81 | Temp 98.3°F | Wt 213.8 lb

## 2018-03-03 DIAGNOSIS — W19XXXA Unspecified fall, initial encounter: Secondary | ICD-10-CM

## 2018-03-03 DIAGNOSIS — E1165 Type 2 diabetes mellitus with hyperglycemia: Secondary | ICD-10-CM

## 2018-03-03 DIAGNOSIS — J3489 Other specified disorders of nose and nasal sinuses: Secondary | ICD-10-CM

## 2018-03-03 DIAGNOSIS — M4802 Spinal stenosis, cervical region: Secondary | ICD-10-CM

## 2018-03-03 DIAGNOSIS — R1310 Dysphagia, unspecified: Secondary | ICD-10-CM

## 2018-03-03 DIAGNOSIS — E1140 Type 2 diabetes mellitus with diabetic neuropathy, unspecified: Secondary | ICD-10-CM

## 2018-03-03 DIAGNOSIS — R067 Sneezing: Secondary | ICD-10-CM

## 2018-03-03 DIAGNOSIS — R5383 Other fatigue: Secondary | ICD-10-CM | POA: Diagnosis not present

## 2018-03-03 DIAGNOSIS — R0602 Shortness of breath: Secondary | ICD-10-CM

## 2018-03-03 DIAGNOSIS — L299 Pruritus, unspecified: Secondary | ICD-10-CM

## 2018-03-03 DIAGNOSIS — R1319 Other dysphagia: Secondary | ICD-10-CM

## 2018-03-03 DIAGNOSIS — G894 Chronic pain syndrome: Secondary | ICD-10-CM

## 2018-03-03 DIAGNOSIS — IMO0002 Reserved for concepts with insufficient information to code with codable children: Secondary | ICD-10-CM

## 2018-03-03 DIAGNOSIS — Z794 Long term (current) use of insulin: Secondary | ICD-10-CM

## 2018-03-03 DIAGNOSIS — K219 Gastro-esophageal reflux disease without esophagitis: Secondary | ICD-10-CM

## 2018-03-03 DIAGNOSIS — Z79899 Other long term (current) drug therapy: Secondary | ICD-10-CM

## 2018-03-03 DIAGNOSIS — E1142 Type 2 diabetes mellitus with diabetic polyneuropathy: Secondary | ICD-10-CM

## 2018-03-03 DIAGNOSIS — Y92009 Unspecified place in unspecified non-institutional (private) residence as the place of occurrence of the external cause: Secondary | ICD-10-CM

## 2018-03-03 DIAGNOSIS — I2510 Atherosclerotic heart disease of native coronary artery without angina pectoris: Secondary | ICD-10-CM

## 2018-03-03 DIAGNOSIS — R2681 Unsteadiness on feet: Secondary | ICD-10-CM

## 2018-03-03 DIAGNOSIS — R531 Weakness: Secondary | ICD-10-CM

## 2018-03-03 DIAGNOSIS — E1151 Type 2 diabetes mellitus with diabetic peripheral angiopathy without gangrene: Secondary | ICD-10-CM | POA: Diagnosis not present

## 2018-03-03 DIAGNOSIS — Z79891 Long term (current) use of opiate analgesic: Secondary | ICD-10-CM

## 2018-03-03 DIAGNOSIS — I10 Essential (primary) hypertension: Secondary | ICD-10-CM | POA: Diagnosis not present

## 2018-03-03 DIAGNOSIS — G479 Sleep disorder, unspecified: Secondary | ICD-10-CM

## 2018-03-03 HISTORY — DX: Unspecified place in unspecified non-institutional (private) residence as the place of occurrence of the external cause: W19.XXXA

## 2018-03-03 HISTORY — DX: Other dysphagia: R13.19

## 2018-03-03 HISTORY — DX: Unspecified fall, initial encounter: Y92.009

## 2018-03-03 LAB — POCT GLYCOSYLATED HEMOGLOBIN (HGB A1C): Hemoglobin A1C: 8.1 % — AB (ref 4.0–5.6)

## 2018-03-03 LAB — GLUCOSE, CAPILLARY: Glucose-Capillary: 131 mg/dL — ABNORMAL HIGH (ref 65–99)

## 2018-03-03 MED ORDER — PIOGLITAZONE HCL 15 MG PO TABS: 15.0000 mg | ORAL_TABLET | Freq: Every day | ORAL | 5 refills | Status: DC

## 2018-03-03 MED ORDER — METFORMIN HCL 1000 MG PO TABS: 1000.0000 mg | ORAL_TABLET | Freq: Two times a day (BID) | ORAL | 3 refills | Status: DC

## 2018-03-03 MED ORDER — LORATADINE 10 MG PO TABS: 10.0000 mg | ORAL_TABLET | Freq: Every day | ORAL | 3 refills | Status: DC

## 2018-03-03 MED ORDER — MELATONIN 1 MG PO TABS: 1.0000 mg | ORAL_TABLET | Freq: Every day | ORAL | 3 refills | Status: DC

## 2018-03-03 MED ORDER — AMLODIPINE BESYLATE 5 MG PO TABS: 5.0000 mg | ORAL_TABLET | Freq: Every day | ORAL | 3 refills | Status: DC

## 2018-03-03 MED ORDER — NITROGLYCERIN 0.4 MG SL SUBL: 0.4000 mg | SUBLINGUAL_TABLET | SUBLINGUAL | 3 refills | Status: DC | PRN

## 2018-03-03 MED ORDER — PIOGLITAZONE HCL 15 MG PO TABS: 15.0000 mg | ORAL_TABLET | Freq: Every day | ORAL | 3 refills | Status: DC

## 2018-03-03 NOTE — Assessment & Plan Note (Signed)
This problem is new.  She states for the past year or so, she experiences esophageal dysphasia to pills and food.  It gets stuck in her chest area and has made her vomit on more than one occasion.  She has no dysphasia to liquids.  This is not similar to the symptoms she had with her age vocal cord atrophy which was treated by speech therapy.  She does have GERD on occasion but not frequently.  She is on a PPI.  PLAN : GI referral for EGD consideration

## 2018-03-03 NOTE — Assessment & Plan Note (Signed)
This problem is chronic and uncontrolled.  She is on atenolol 100, Lasix 40, and amlodipine 5 mg.  However, amlodipine was not in her medication bag and she is not taking it.  She thought maybe her cardiologist had stopped it.  It had been stopped in August 2018 after her long hospitalization but it had been resumed sometime before June 2018.  It looks like the prescription expired in February 2019. I reviewed her most recent cardiology note and amlodipine was on the medication list.  It was also on my medication list in February.  Therefore I simply think the prescription expired and that no physician had stopped it.  I have resent it to her pharmacy.  PLAN : Cont atenolol 100, lasix 40, and amlodipine 5   BP Readings from Last 3 Encounters:  03/03/18 (!) 155/66  01/26/18 (!) 146/74  12/09/17 (!) 164/82

## 2018-03-03 NOTE — Progress Notes (Signed)
   Subjective:    Patient ID: [name] [name], female    DOB: [birth year], 72 y.o.   MRN: [id number]  HPI  [name] [name] is here for DM F/U. Please see the A&P for the status of the pt's chronic medical problems.  ROS : per ROS section and in problem oriented charting. All other systems are negative.  PMHx, Soc hx, and / or Fam hx : Lives alone   Review of Systems  Constitutional: Positive for fatigue.  HENT: Positive for rhinorrhea and sneezing.   Eyes: Positive for itching.  Respiratory: Positive for shortness of breath.   Neurological: Positive for weakness.  Psychiatric/Behavioral: Positive for sleep disturbance.       Objective:   Physical Exam  Constitutional: She appears well-developed and well-nourished. No distress.  HENT:  Head: Normocephalic and atraumatic.  Right Ear: External ear normal.  Left Ear: External ear normal.  Nose: Nose normal.  Eyes: Conjunctivae and EOM are normal. Right eye exhibits no discharge. Left eye exhibits no discharge. No scleral icterus.  Cardiovascular: Normal rate, regular rhythm and normal heart sounds.  No murmur heard. Pulmonary/Chest: Effort normal and breath sounds normal. No respiratory distress.  Skin: Skin is warm and dry. She is not diaphoretic.  Psychiatric: She has a normal mood and affect. Her behavior is normal. Judgment and thought content normal.          Assessment & Plan:

## 2018-03-03 NOTE — Assessment & Plan Note (Signed)
This problem is chronic and uncontrolled.  She had seen sports medicine who referred her to interventional radiology to get an epidural steroid injection for her cervical spinal stenosis.  She says it helped for 2 to 3 days and then stopped helping.  She has tramadol to be used as needed and has no red flag or orange flag behavior regarding this medication.  I am happy to prescribe it as needed.  PLAN:  Cont current meds

## 2018-03-03 NOTE — Assessment & Plan Note (Signed)
This problem is chronic and stable.  She complains of her feet being numb, painful, and tingly.  They bother her enough that she has trouble falling asleep.  It does not sound like she has any symptoms compatible with restless leg syndrome as she denies any jerking at night or the excessive need to move them.  She moves them simply because they are numb and tingly.  She is on Lyrica and states it is not helping much.  She has also been on gabapentin which also did not help.  I discussed changing her to Venlafaxine but she was not interested in making a medication change today.  PLAN:  Cont current meds

## 2018-03-03 NOTE — Assessment & Plan Note (Addendum)
This problem is chronic and uncontrolled.  Her A1c trend is 6.6.- 8.5 - 8.1.  She is on Lantus 10 units nightly, metformin 1000 BID, and Victoza.  She checks her CBGs 3 times a day.  She has no hypoglycemia but her average CBG over the past 3 months has been 167.  77% of her readings are above range.  Her lowest sugar is 93 and these occur pretty much throughout the day.  I discussed 2 strategies.  The first is modifying her dietary intake and she did not think that she would be able to do this.  Therefore, she elected to start another medication.  I do not want to increase her Lantus as it would drive her current CBGs of 91 even lower, likely triggering hypoglycemia symptoms.  And her metformin and Victoza are already at max.  I am going to start pioglitazone as it possibly has a cardiovascular benefit and she does have CAD.  This is a once a day pill.  She does not have overt heart failure although she has chronic diastolic heart failure.  My goal A1c would probably be around 7.5 or lower. This is because she does have neuropathy and known CAD.  PLAN : Pioglitazone 15 mg and may titrate up Cont lantus 10 Cont met 1000 BID Cont victoza Podiatry referral for diabetic foot care

## 2018-03-03 NOTE — Assessment & Plan Note (Signed)
This problem is new.  She states that walking is very difficult for her.  She gets dyspneic and her muscles become weak and feel stiff.  She has not had any falls.  She is no longer able to do the things that she used to do like going to the grocery store.  She is only able to cook a little bit or clean a little bit before she has to sit down.  She had physical therapy for about a month or 2 and she states that might of helped a little bit.  Her electrolytes including calcium are normal.  She is a little bit anemic but not significantly off her baseline.  Her ferritin a year ago was borderline. Her B12 level was supratherapeutic and she is taking supplementation.  TSH has not been checked for a year but she has no other hypothyroidism symptoms.  Vitamin D level is controversial.  I have not found any reversible causes for her symptoms.  It is possible that this is simply deconditioning.  I offered physical therapy again but she declined to accept it currently and will think about it.  PLAN : she will think about PT

## 2018-03-03 NOTE — Patient Instructions (Signed)
1. Let me know if you want physical therapy 2. I will refill melatonin, claritin, metformin, NTG 3. I will refer you to [facility] for troubel swallowing 4. I will refer you to [place] 5. I will call you about new sugar pill and what happened to Amlodipine

## 2018-03-08 ENCOUNTER — Telehealth: Payer: Self-pay | Admitting: Dietician

## 2018-03-08 NOTE — Telephone Encounter (Signed)
[name] [name] calls for help with her meter that will not work. It shows an e-9 error code which means low battery. I coached her how to reset meter/batteries on the phone, but it did not work. She agreed to take the meter to her pharmacy in the next day or so and have them assist her with new batteries. She'd also agreed to call back if she is unable to get her meter working again.

## 2018-03-09 ENCOUNTER — Other Ambulatory Visit (INDEPENDENT_AMBULATORY_CARE_PROVIDER_SITE_OTHER): Payer: Medicare Other

## 2018-03-09 DIAGNOSIS — D649 Anemia, unspecified: Secondary | ICD-10-CM

## 2018-03-09 LAB — CBC WITH DIFFERENTIAL/PLATELET
Basophils Absolute: 0.1 10*3/uL (ref 0.0–0.1)
Basophils Relative: 1.5 % (ref 0.0–3.0)
Eosinophils Absolute: 0.1 10*3/uL (ref 0.0–0.7)
Eosinophils Relative: 1.1 % (ref 0.0–5.0)
HCT: 35.4 % — ABNORMAL LOW (ref 36.0–46.0)
Hemoglobin: 12.1 g/dL (ref 12.0–15.0)
Lymphocytes Relative: 51.8 % — ABNORMAL HIGH (ref 12.0–46.0)
Lymphs Abs: 3.3 10*3/uL (ref 0.7–4.0)
MCHC: 34.3 g/dL (ref 30.0–36.0)
MCV: 87.4 fl (ref 78.0–100.0)
Monocytes Absolute: 0.5 10*3/uL (ref 0.1–1.0)
Monocytes Relative: 7.9 % (ref 3.0–12.0)
Neutro Abs: 2.4 10*3/uL (ref 1.4–7.7)
Neutrophils Relative %: 37.7 % — ABNORMAL LOW (ref 43.0–77.0)
Platelets: 302 10*3/uL (ref 150.0–400.0)
RBC: 4.05 Mil/uL (ref 3.87–5.11)
RDW: 15.7 % — ABNORMAL HIGH (ref 11.5–15.5)
WBC: 6.3 10*3/uL (ref 4.0–10.5)

## 2018-03-18 ENCOUNTER — Telehealth: Payer: Self-pay | Admitting: Dietician

## 2018-03-18 NOTE — Telephone Encounter (Signed)
Pioglitazone started about 2 weeks ago. Will not titrate up now based in CBG's. Will F/U in [facility]

## 2018-03-18 NOTE — Telephone Encounter (Signed)
Got meter issues resolved. No problems with her new diabetes medicine. (cannot pronounce it's name) Blood sugars are: 153/155/129/130/173/142/123/161/124/118. She feels fine, no other issues.

## 2018-03-28 ENCOUNTER — Other Ambulatory Visit: Payer: Self-pay | Admitting: Internal Medicine

## 2018-03-29 NOTE — Telephone Encounter (Signed)
Pls sch Aug F/U [name] [name] or [facility] DM & HTN F/U

## 2018-04-01 ENCOUNTER — Encounter: Payer: Self-pay | Admitting: Podiatry

## 2018-04-01 ENCOUNTER — Other Ambulatory Visit: Payer: Self-pay

## 2018-04-01 ENCOUNTER — Ambulatory Visit (INDEPENDENT_AMBULATORY_CARE_PROVIDER_SITE_OTHER): Payer: Medicare Other | Admitting: Podiatry

## 2018-04-01 VITALS — BP 151/79 | HR 79

## 2018-04-01 DIAGNOSIS — E1142 Type 2 diabetes mellitus with diabetic polyneuropathy: Secondary | ICD-10-CM | POA: Diagnosis not present

## 2018-04-01 DIAGNOSIS — M79675 Pain in left toe(s): Secondary | ICD-10-CM

## 2018-04-01 DIAGNOSIS — B351 Tinea unguium: Secondary | ICD-10-CM | POA: Diagnosis not present

## 2018-04-01 DIAGNOSIS — M2011 Hallux valgus (acquired), right foot: Secondary | ICD-10-CM | POA: Diagnosis not present

## 2018-04-01 DIAGNOSIS — E1151 Type 2 diabetes mellitus with diabetic peripheral angiopathy without gangrene: Secondary | ICD-10-CM

## 2018-04-01 DIAGNOSIS — M79674 Pain in right toe(s): Secondary | ICD-10-CM

## 2018-04-01 DIAGNOSIS — M2012 Hallux valgus (acquired), left foot: Secondary | ICD-10-CM

## 2018-04-01 NOTE — Patient Instructions (Signed)
Corns and Calluses Corns are small areas of thickened skin that occur on the top, sides, or tip of a toe. They contain a cone-shaped core with a point that can press on a nerve below. This causes pain. Calluses are areas of thickened skin that can occur anywhere on the body including hands, fingers, palms, soles of the feet, and heels.Calluses are usually larger than corns. What are the causes? Corns and calluses are caused by rubbing (friction) or pressure, such as from shoes that are too tight or do not fit properly. What increases the risk? Corns are more likely to develop in people who have toe deformities, such as hammer toes. Since calluses can occur with friction to any area of the skin, calluses are more likely to develop in people who:  Work with their hands.  Wear shoes that fit poorly, shoes that are too tight, or shoes that are high-heeled.  Have toes deformities.  What are the signs or symptoms? Symptoms of a corn or callus include:  A hard growth on the skin.  Pain or tenderness under the skin.  Redness and swelling.  Increased discomfort while wearing tight-fitting shoes.  How is this diagnosed? Corns and calluses may be diagnosed with a medical history and physical exam. How is this treated? Corns and calluses may be treated with:  Removing the cause of the friction or pressure. This may include: ? Changing your shoes. ? Wearing shoe inserts (orthotics) or other protective layers in your shoes, such as a corn pad. ? Wearing gloves.  Medicines to help soften skin in the hardened, thickened areas.  Reducing the size of the corn or callus by removing the dead layers of skin.  Antibiotic medicines to treat infection.  Surgery, if a toe deformity is the cause.  Follow these instructions at home:  Take medicines only as directed by your health care provider.  If you were prescribed an antibiotic, finish all of it even if you start to feel better.  Wear  shoes that fit well. Avoid wearing high-heeled shoes and shoes that are too tight or too loose.  Wear any padding, protective layers, gloves, or orthotics as directed by your health care provider.  Soak your hands or feet and then use a file or pumice stone to soften your corn or callus. Do this as directed by your health care provider.  Check your corn or callus every day for signs of infection. Watch for: ? Redness, swelling, or pain. ? Fluid, blood, or pus. Contact a health care provider if:  Your symptoms do not improve with treatment.  You have increased redness, swelling, or pain at the site of your corn or callus.  You have fluid, blood, or pus coming from your corn or callus.  You have new symptoms. This information is not intended to replace advice given to you by your health care provider. Make sure you discuss any questions you have with your health care provider. Document Released: 07/04/2004 Document Revised: 04/17/2016 Document Reviewed: 09/24/2014 Elsevier Interactive Patient Education  2018 Elsevier Inc.  Diabetes and Foot Care Diabetes may cause you to have problems because of poor blood supply (circulation) to your feet and legs. This may cause the skin on your feet to become thinner, break easier, and heal more slowly. Your skin may become dry, and the skin may peel and crack. You may also have nerve damage in your legs and feet causing decreased feeling in them. You may not notice minor injuries to your   feet that could lead to infections or more serious problems. Taking care of your feet is one of the most important things you can do for yourself. Follow these instructions at home:  Wear shoes at all times, even in the house. Do not go barefoot. Bare feet are easily injured.  Check your feet daily for blisters, cuts, and redness. If you cannot see the bottom of your feet, use a mirror or ask someone for help.  Wash your feet with warm water (do not use hot water)  and mild soap. Then pat your feet and the areas between your toes until they are completely dry. Do not soak your feet as this can dry your skin.  Apply a moisturizing lotion or petroleum jelly (that does not contain alcohol and is unscented) to the skin on your feet and to dry, brittle toenails. Do not apply lotion between your toes.  Trim your toenails straight across. Do not dig under them or around the cuticle. File the edges of your nails with an emery board or nail file.  Do not cut corns or calluses or try to remove them with medicine.  Wear clean socks or stockings every day. Make sure they are not too tight. Do not wear knee-high stockings since they may decrease blood flow to your legs.  Wear shoes that fit properly and have enough cushioning. To break in new shoes, wear them for just a few hours a day. This prevents you from injuring your feet. Always look in your shoes before you put them on to be sure there are no objects inside.  Do not cross your legs. This may decrease the blood flow to your feet.  If you find a minor scrape, cut, or break in the skin on your feet, keep it and the skin around it clean and dry. These areas may be cleansed with mild soap and water. Do not cleanse the area with peroxide, alcohol, or iodine.  When you remove an adhesive bandage, be sure not to damage the skin around it.  If you have a wound, look at it several times a day to make sure it is healing.  Do not use heating pads or hot water bottles. They may burn your skin. If you have lost feeling in your feet or legs, you may not know it is happening until it is too late.  Make sure your health care provider performs a complete foot exam at least annually or more often if you have foot problems. Report any cuts, sores, or bruises to your health care provider immediately. Contact a health care provider if:  You have an injury that is not healing.  You have cuts or breaks in the skin.  You have  an ingrown nail.  You notice redness on your legs or feet.  You feel burning or tingling in your legs or feet.  You have pain or cramps in your legs and feet.  Your legs or feet are numb.  Your feet always feel cold. Get help right away if:  There is increasing redness, swelling, or pain in or around a wound.  There is a red line that goes up your leg.  Pus is coming from a wound.  You develop a fever or as directed by your health care provider.  You notice a bad smell coming from an ulcer or wound. This information is not intended to replace advice given to you by your health care provider. Make sure you discuss any questions   have with your health care provider. Document Released: 09/25/2000 Document Revised: 03/05/2016 Document Reviewed: 03/07/2013 Elsevier Interactive Patient Education  2017 [employer].

## 2018-04-05 NOTE — Progress Notes (Addendum)
Subjective: [name] [name] presents today referred by Doctor for Comprehensive Diabetic Foot Examination. Patient has diabetes and cc of painful, discolored, thick toenails which interfere with activities of daily living. Pain is aggravated when wearing enclosed shoe gear. Pain is getting progressively worse. She is afraid to cut her toenails due to her dx of diabetes, diabetic neuropathy and PVD. She is also on a blood thinner.  Patient has been diagnosed with Type 2 diabetes and is referred by [name].  Current Diabetes Management Regimen is: Combination of oral medication and insulin. Also takes Lyrica for diabetic neuropathy.  Last A1C: 8.1% 4 weeks ago  PMH: Medical History Collapse by Default  Collapse by Default      03/27/2014 PVD (peripheral vascular disease) ([place])   01/03/2013 Chronic pain syndrome   11/01/2006 DYSLIPIDEMIA   08/26/2006 DIABETES MELLITUS, TYPE II   08/26/2006 DEPRESSION   08/26/2006 OBESITY   08/26/2006 GERD   08/26/2006 CAD   08/26/2006 HYPERTENSION   Date Unknown Anemia  Date Unknown Arthritis  Date Unknown Gallstones   Problem List Collapse by Default  Collapse by Default      New problems from outside sources are available for reconciliation  Cardiovascular and Mediastinum   DM (diabetes mellitus), type 2, uncontrolled, periph vascular complic (HCC)   HTN (hypertension)   Coronary atherosclerosis   Diastolic CHF, chronic (HCC)   PVD (peripheral vascular disease) ([place])   Aortic atherosclerosis (HCC)  Respiratory   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  Digestive   GERD   Small intestinal bacterial overgrowth   Esophageal dysphagia  Endocrine   Diabetic neuropathy ([place])   CKD stage 3 due to type 2 diabetes mellitus ([place])  Musculoskeletal and Integument   Osteopenia  Other   Vitamin B12 deficiency   Vitamin D deficiency   Hyperlipidemia   Obesity   Anemia   Depression   Insomnia   Chronic pain syndrome   Healthcare  maintenance   Goals of care, counseling/discussion   Right carotid bruit   Hypomagnesemia   GLAUCOMA NOS   ASCUS with positive high risk HPV   Gait instability   Surgical History Collapse by Default  Collapse by Default      07/18/07 posterior lumbar interfusion surgery [Other]   10/08 lumbar fusion surgery [Other]   Date Unknown Cholecystectomy   Date Unknown endometrial biospy [Other]  Date Unknown Left heart cath  Date Unknown rotator cuff surgery [Other] (Bilateral)     Medications    ACCU-CHEK FASTCLIX LANCETS MISC    amLODipine (NORVASC) 5 MG tablet    aspirin 81 MG tablet    atenolol (TENORMIN) 100 MG tablet    atorvastatin (LIPITOR) 10 MG tablet    Blood Glucose Monitoring Suppl (ACCU-CHEK GUIDE) w/Device KIT    calcipotriene (DOVONOX) 0.005 % cream    dihydroergotamine (MIGRANAL) 4 MG/ML nasal spray    furosemide (LASIX) 40 MG tablet    glucose blood (ACCU-CHEK GUIDE) test strip    Insulin Glargine (LANTUS SOLOSTAR) 100 UNIT/ML Solostar Pen    Insulin Pen Needle 32G X 4 MM MISC    isosorbide mononitrate (IMDUR) 30 MG 24 hr tablet    lidocaine (XYLOCAINE) 5 % ointment    liraglutide (VICTOZA) 18 MG/3ML SOPN    loratadine (CLARITIN) 10 MG tablet    LYRICA 150 MG capsule    Melatonin 1 MG TABS    metFORMIN (GLUCOPHAGE) 1000 MG tablet    nitroGLYCERIN (NITROSTAT) 0.4 MG SL tablet    omeprazole (PRILOSEC) 40  MG capsule    PARoxetine (PAXIL) 40 MG tablet    pioglitazone (ACTOS) 15 MG tablet    traMADol (ULTRAM) 50 MG tablet    vitamin B-12 (CYANOCOBALAMIN) 1000 MCG tablet    Allergies      No Known Allergies   Tobacco History Collapse by Default  Collapse by Default      Smoking Status  Passive Smoke Exposure - Never Smoker  Smokeless Tobacco Status  Never Used    Family History Collapse by Default  Collapse by Default      Sister         Brother         Sister (Deceased)         Brother (Deceased)         Mother (Deceased at age  56) Cirrhosis         Father (Deceased at age 1) Diabetes    Hypertension         Daughter         Sister Hypertension         Sister Diabetes         Sister Hyperlipidemia            Neg Hx Colon cancer    Throat cancer    Complete detailed diabetic foot examination:  Medical History: [name] has diabetic neuropathy [name] has cardiovascular disease Ms. [name] Johnsonhas nephropathy: CKD stage 3 Ms. [name] Johnsonhas peripheral vascular disease  Current History: 1. Any change in the foot or feet since last evaluation? N/A 2.  Current ulcer or history of foot ulcer? No. 3. Is there pain in the calf muscles when walking that is relieved by rest? Yes   Foot Examination:  Vascular Examination: Capillary refill time <3 seconds x 10 digits Dorsalis pedis pulses diminished  b/l Posterior tibial pulses absent  b/l No digital hair x 10 digits Skin temperature warm to cool b/l  Dermatological Examination:  Skin thin, shiny and atrophic b/l  Toenails 1-5 b/l discolored, thick, dystrophic with subungual debris and pain with palpation to nailbeds due to thickness of nails.  Callus(es) noted:  Redness noted: No Warmth noted: No Fissure noted: No Swelling noted: Yes BLE Ulcer noted: No Maceration noted: No Preulcerative lesion noted: No Dry skin noted: Yes  Musculoskeletal: Muscle strength 5/5 to all LE muscle groups Toe deformities noted: Bunions present: Yes b/l Charcot foot present: No Foot Drop present: No Prominent metatarsal heads: NO Amputation (date, side and level): N/A  Sensory Foot Examination: Sensation tested with 10 gram monofilament: Right great toe plantarly absent Right 4th toe plantarly absent Submetatarsal head 1 right foot absent Submetatarsal head 3 right foot absent Submetatarsal head 5 right foot absent  Left great toe plantarly absent Left 4th toe plantarly absent Submetatarsal head 1 left foot  absent Submetatarsal head 3 left foot absent Submetatarsal head 5 left foot absent  Risk Categorization:  High Risk Patient has one or more of the following: Loss of protective sensation Absent pedal pulses Severe Foot deformity  Footwear Assessment: Does the patient wear appropriate shoes? Yes Does the patient need inserts/orthotics? Yes  Assessment: 1. Painful onychomycosis toenails 1-5 b/l 2. NIDDM with Peripheral arterial disease and diabetic neuropathy 3. HAV with bunion deformity 4. Edema BLE  Management Plan: Patient underwent complete diabetic foot examination on today. 1. Pt education provided for preventative foot care 2. Toenails 1-5 b/l were debrided in length and girth without iatrogenic bleeding. 3. Patient qualifies for  diabetic shoes and insoles based on my examination today with diagnoses of:  A. NIDDM with Peripheral arterial disease and diabetic neuropathy B. HAV with bunion deformity C. Edema BLE 4. Patient to report any pedal injuries to medical professional  5.  Patient will need routine diabetic foot care.  6. Dispensed written information regarding diabetic foot care education. All questions answered on today. Will schedule patient for diabetic shoe evaluation. 7. Follow up 3 months.  8. Patient/POA to   call should there be a concern in the interim.

## 2018-04-07 ENCOUNTER — Other Ambulatory Visit: Payer: Self-pay | Admitting: *Deleted

## 2018-04-07 MED ORDER — TRAMADOL HCL 50 MG PO TABS: 50.0000 mg | ORAL_TABLET | Freq: Two times a day (BID) | ORAL | 1 refills | Status: DC

## 2018-04-09 ENCOUNTER — Other Ambulatory Visit: Payer: Self-pay | Admitting: Internal Medicine

## 2018-04-12 ENCOUNTER — Other Ambulatory Visit: Payer: Self-pay | Admitting: Internal Medicine

## 2018-04-12 NOTE — Telephone Encounter (Signed)
If I have an opening in [facility], pls sch her an appt with me. Otherwise, pls sch with [facility].

## 2018-04-12 NOTE — Telephone Encounter (Signed)
Already pending in separate encounter. Hubbard Hartshorn, RN, BSN

## 2018-04-29 ENCOUNTER — Other Ambulatory Visit: Payer: Self-pay | Admitting: Internal Medicine

## 2018-04-29 ENCOUNTER — Encounter: Payer: Self-pay | Admitting: Gastroenterology

## 2018-04-29 ENCOUNTER — Ambulatory Visit (INDEPENDENT_AMBULATORY_CARE_PROVIDER_SITE_OTHER): Payer: Medicare Other | Admitting: Gastroenterology

## 2018-04-29 VITALS — BP 122/64 | HR 72 | Ht 65.0 in | Wt 219.0 lb

## 2018-04-29 DIAGNOSIS — K5900 Constipation, unspecified: Secondary | ICD-10-CM | POA: Diagnosis not present

## 2018-04-29 DIAGNOSIS — R1310 Dysphagia, unspecified: Secondary | ICD-10-CM | POA: Diagnosis not present

## 2018-04-29 MED ORDER — POLYETHYLENE GLYCOL 3350 17 G PO PACK
17.0000 g | PACK | Freq: Every day | ORAL | 0 refills | Status: DC
Start: 2018-04-29 — End: 2019-06-29

## 2018-04-29 NOTE — Progress Notes (Signed)
HPI :  72 y/o female here for a follow up visit, she has a history of DM, CAD / PVD, C diff.  Today she is here for assessment of dysphagia. This is a new symptom bothering her since this past December. She has dysphagia mostly to solids and pills, rarely liquids. She feels it in her upper chest when she swallows. No history of impactions. This occurs occasionally, maybe a few times per week. Does not occur with every meal. She has some mild odynophagia. She is taking omeprazole 40 mg twice daily which works well for her reflux symptoms. She has no abdominal pains or vomiting that bothers her at all. She's never had a prior endoscopy.  She otherwise endorses some constipation, she is having a bowel movement once every 3-4 days, passing hard stools. She denies any blood in her stools. She is not taking anything for her bowels at present.   Colonoscopy 01/2009 - normal Echocardiogram 02/26/17 - EF 70%  Past Medical History:  Diagnosis Date  . Anemia   . Arthritis   . CAD 08/26/2006   Cath 07/05:multiple areas of nonobstructive disease = medical & RF mgmt, well preserved global systolic function. Dr. Lia Foyer. Nuclear med stress test 01/2014 : Normal stress nuclear study. LV Ejection Fraction: 76%. LV Wall Motion: NL LV Function; NL Wall Motion.      . Chronic pain syndrome 01/03/2013   Multifactorial. Non opioid requiring.   L2-5 fusion, with hardware at L2-3, stable per MRI 2010. Followed by neurosurgery (Dr. 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    [name] DEPRESSION 08/26/2006   On paxil    . DIABETES MELLITUS, TYPE II 08/26/2006   Insulin dependent. Victoza added 03/2014  . DYSLIPIDEMIA 11/01/2006   LDL goal < 100, 70 if can achieve without side effects.     . Gallstones   . GERD 08/26/2006   Heartburn QHS.     [name] HYPERTENSION 08/26/2006   On BB, ACEI, lasix, norvasc, metolazone, and imdur.     . OBESITY 08/26/2006   BMI 39.5.  Obesity Class 2.     . PVD (peripheral vascular disease) ([place]) 03/27/2014   2015 LE arterial Duplex - Possible mild inflow disease on the left. 0-49% bilateral SFA disease, without focal stenosis. Three vesel run-off, bilaterally. Medical tx.      Past Surgical History:  Procedure Laterality Date  . CHOLECYSTECTOMY     pt denies 04/07/16  . endometrial biospy    . LEFT HEART CATH    . lumbar fusion surgery  10/08   L3-L5  . posterior lumbar interfusion surgery  07/18/07   L2-L3  . rotator cuff surgery Bilateral    Family History  Problem Relation Age of Onset  . Cirrhosis Mother   . Diabetes Father   . Hypertension Father   . Hypertension Sister   . Diabetes Sister   . Hyperlipidemia Sister   . Colon cancer Neg Hx   . Throat cancer Neg Hx    Social History   Tobacco Use  . Smoking status: Passive Smoke Exposure - Never Smoker  . Smokeless tobacco: Never Used  Substance Use Topics  . Alcohol use: No    Alcohol/week: 0.0 oz  . Drug use: No   Current Outpatient Medications  Medication Sig Dispense Refill  . ACCU-CHEK FASTCLIX LANCETS MISC Check blood sugar 3 times a day 102 each 12  . amLODipine (NORVASC) 5 MG tablet  Take 1 tablet (5 mg total) by mouth daily. 90 tablet 3  . aspirin 81 MG tablet Take 1 tablet (81 mg total) by mouth daily. 90 tablet 3  . atenolol (TENORMIN) 100 MG tablet Take 1 tablet (100 mg total) by mouth daily. 90 tablet 3  . atorvastatin (LIPITOR) 10 MG tablet Take 1 tablet (10 mg total) by mouth daily. 90 tablet 3  . Blood Glucose Monitoring Suppl (ACCU-CHEK GUIDE) w/Device KIT 1 each by Does not apply route 3 (three) times daily. 1 kit 0  . calcipotriene (DOVONOX) 0.005 % cream     . dihydroergotamine (MIGRANAL) 4 MG/ML nasal spray     . furosemide (LASIX) 40 MG tablet take 1 tablet by mouth once daily 90 tablet 3  . glucose blood (ACCU-CHEK GUIDE) test strip Check blood sugar 3 times a day 100 each 12  . Insulin Glargine (LANTUS SOLOSTAR) 100  UNIT/ML Solostar Pen Inject 12 Units into the skin daily at 10 pm.    . Insulin Pen Needle 32G X 4 MM MISC Use to inject victoza one time a day and lantus one time a day 100 each 5  . isosorbide mononitrate (IMDUR) 30 MG 24 hr tablet TAKE 1 TABLET BY MOUTH ONCE DAILY 90 tablet 3  . lidocaine (XYLOCAINE) 5 % ointment     . liraglutide (VICTOZA) 18 MG/3ML SOPN ADMINISTER 1.2 MG UNDER THE SKIN DAILY 18 mL 5  . loratadine (CLARITIN) 10 MG tablet Take 1 tablet (10 mg total) by mouth daily. 90 tablet 3  . LYRICA 150 MG capsule take 1 capsule by mouth once daily 30 capsule 11  . Melatonin 1 MG TABS Take 1 tablet (1 mg total) by mouth at bedtime. 90 tablet 3  . metFORMIN (GLUCOPHAGE) 1000 MG tablet Take 1 tablet (1,000 mg total) by mouth 2 (two) times daily with a meal. 180 tablet 3  . nitroGLYCERIN (NITROSTAT) 0.4 MG SL tablet Place 1 tablet (0.4 mg total) under the tongue every 5 (five) minutes as needed for chest pain. 25 tablet 3  . omeprazole (PRILOSEC) 40 MG capsule Take 40 mg by mouth 2 (two) times daily.    [name] PARoxetine (PAXIL) 40 MG tablet Take 1 tablet (40 mg total) by mouth every morning. 90 tablet 3  . pioglitazone (ACTOS) 15 MG tablet Take 1 tablet (15 mg total) by mouth daily. 90 tablet 3  . traMADol (ULTRAM) 50 MG tablet Take 1 tablet (50 mg total) by mouth 2 (two) times daily. 60 tablet 1  . vitamin B-12 (CYANOCOBALAMIN) 1000 MCG tablet Take 1,000 mcg by mouth daily.     No current facility-administered medications for this visit.    No Known Allergies   Review of Systems: All systems reviewed and negative except where noted in HPI.   Lab Results  Component Value Date   WBC 6.3 03/09/2018   HGB 12.1 03/09/2018   HCT 35.4 (L) 03/09/2018   MCV 87.4 03/09/2018   PLT 302.0 03/09/2018    Lab Results  Component Value Date   CREATININE 1.05 (H) 01/26/2018   BUN 21 (H) 01/26/2018   NA 141 01/26/2018   K 4.3 01/26/2018   CL 102 01/26/2018   CO2 29 01/26/2018    Lab Results    Component Value Date   ALT 5 05/10/2017   AST 12 05/10/2017   ALKPHOS 87 05/10/2017   BILITOT 0.6 05/10/2017     Physical Exam: BP 122/64   Pulse 72   Ht 5'  5" (1.651 m)   Wt 219 lb (99.3 kg)   BMI 36.44 kg/m  Constitutional: Pleasant,well-developed, female in no acute distress. HEENT: Normocephalic and atraumatic. Conjunctivae are normal. No scleral icterus. Neck supple.  Cardiovascular: Normal rate, regular rhythm.  Pulmonary/chest: Effort normal and breath sounds normal. No wheezing, rales or rhonchi. Abdominal: Soft, nondistended, nontender. There are no masses palpable. . Extremities: no edema Lymphadenopathy: No cervical adenopathy noted. Neurological: Alert and oriented to person place and time. Skin: Skin is warm and dry. No rashes noted. Psychiatric: Normal mood and affect. Behavior is normal.   ASSESSMENT AND PLAN: 72 year old female with medical history as outlined above, here for reassessment of the following issues:  Dysphagia - as above, ongoing for the past 6-7 months, mostly to solids and pills. I discussed differential for dysphagia with her. Most likely is stricture/stenosis due to reflux, although need to rule out mass lesion given history of reflux. I'm recommending an upper endoscopy to further evaluate and potentially treat with dilation. I discussed the risks and benefits of endoscopy, dilation, and anesthesia with her. Following this discussion she wanted to proceed, further recommendations pending results. She will continue her omeprazole in the interim.  Constipation - ongoing constipation which bother her. She is not taking anything at present time. Recommend MiraLAX once to twice daily and titrate as needed to benefit. If no improvement she should contact us for reassessment. She is due for screening colonoscopy next year.  [place] Cellar, MD [facility]

## 2018-04-29 NOTE — Patient Instructions (Addendum)
If you are age 73 or older, your body mass index should be between 23-30. Your Body mass index is 36.44 kg/m. If this is out of the aforementioned range listed, please consider follow up with your Primary Care Provider.  If you are age 71 or younger, your body mass index should be between 19-25. Your Body mass index is 36.44 kg/m. If this is out of the aformentioned range listed, please consider follow up with your Primary Care Provider.   You have been scheduled for an endoscopy. Please follow written instructions given to you at your visit today. If you use inhalers (even only as needed), please bring them with you on the day of your procedure. Your physician has requested that you go to www.startemmi.com and enter the access code given to you at your visit today. This web site gives a general overview about your procedure. However, you should still follow specific instructions given to you by our office regarding your preparation for the procedure.  Please purchase the following medications over the counter and take as directed: Miralax, take daily as directed.  Thank you for entrusting me with your care and for choosing [facility], Dr. [place] Cellar

## 2018-05-02 NOTE — Telephone Encounter (Signed)
I filled on 6/18 to [facility]

## 2018-05-04 ENCOUNTER — Encounter: Payer: Self-pay | Admitting: Dietician

## 2018-05-04 ENCOUNTER — Telehealth: Payer: Self-pay | Admitting: Dietician

## 2018-05-04 NOTE — Telephone Encounter (Signed)
Pt's chart flagged for reminder.[name] Hidden Cassady7/24/20193:25 PM

## 2018-05-04 NOTE — Telephone Encounter (Signed)
Called patient to let her know I have one of her accu chek guide meters. She'll get it June 02, 2018 when she comes in for an appointment. I told her that I would put it at the front desk for her.

## 2018-05-09 ENCOUNTER — Other Ambulatory Visit: Payer: Self-pay

## 2018-05-09 ENCOUNTER — Other Ambulatory Visit: Payer: Self-pay | Admitting: Internal Medicine

## 2018-05-11 ENCOUNTER — Other Ambulatory Visit: Payer: Self-pay | Admitting: Internal Medicine

## 2018-05-12 ENCOUNTER — Other Ambulatory Visit: Payer: Self-pay | Admitting: *Deleted

## 2018-05-12 DIAGNOSIS — E1165 Type 2 diabetes mellitus with hyperglycemia: Secondary | ICD-10-CM

## 2018-05-12 DIAGNOSIS — E1151 Type 2 diabetes mellitus with diabetic peripheral angiopathy without gangrene: Secondary | ICD-10-CM

## 2018-05-12 DIAGNOSIS — IMO0002 Reserved for concepts with insufficient information to code with codable children: Secondary | ICD-10-CM

## 2018-05-12 MED ORDER — ACCU-CHEK FASTCLIX LANCETS MISC: 11 refills | Status: DC

## 2018-05-12 NOTE — Telephone Encounter (Signed)
Next appt scheduled 8/22 with PCP. 

## 2018-05-18 ENCOUNTER — Other Ambulatory Visit: Payer: Self-pay

## 2018-05-18 MED ORDER — PREGABALIN 150 MG PO CAPS: 150.0000 mg | ORAL_CAPSULE | Freq: Every day | ORAL | 1 refills | Status: DC

## 2018-05-18 NOTE — Telephone Encounter (Signed)
Requesting to speak with a nurse about a refill. Please call pt back.

## 2018-05-18 NOTE — Telephone Encounter (Signed)
Needs a refill on LYRICA 150 MG capsule @ [facility] on [address], pt contact# [phone].   Pt is calling back

## 2018-05-20 ENCOUNTER — Other Ambulatory Visit: Payer: Self-pay | Admitting: *Deleted

## 2018-05-20 NOTE — Patient Outreach (Signed)
[place] [facility]) Care Management  05/20/2018  [name] [name] 29-Sep-1946 [id number]   [place] Introductory Call  Referral Date:  05/09/2018 Referral Source:  Self referral Reason for Referral:  "Physician requested her to contact [facility]" Insurance:  [employer] Attempt:  Successful telephone outreach to patient for introduction call.  HIPAA verified with patient.  RN Health Coach introduced self and role. Patient verbally agrees to monthly telephone outreaches.  States she does not have time to complete initial telephone assessment today and request a telephone call back next week.  Plan:  RN Health Coach will attempt another telephone outreach within the next 10 business days to complete the initial telephone assessment.   [place] [phone] [name]

## 2018-05-25 DIAGNOSIS — I129 Hypertensive chronic kidney disease with stage 1 through stage 4 chronic kidney disease, or unspecified chronic kidney disease: Secondary | ICD-10-CM | POA: Diagnosis not present

## 2018-05-25 DIAGNOSIS — E785 Hyperlipidemia, unspecified: Secondary | ICD-10-CM | POA: Diagnosis not present

## 2018-05-25 DIAGNOSIS — E1122 Type 2 diabetes mellitus with diabetic chronic kidney disease: Secondary | ICD-10-CM | POA: Diagnosis not present

## 2018-05-25 DIAGNOSIS — N179 Acute kidney failure, unspecified: Secondary | ICD-10-CM | POA: Diagnosis not present

## 2018-05-25 DIAGNOSIS — I5030 Unspecified diastolic (congestive) heart failure: Secondary | ICD-10-CM | POA: Diagnosis not present

## 2018-05-26 ENCOUNTER — Other Ambulatory Visit: Payer: Self-pay | Admitting: *Deleted

## 2018-05-26 NOTE — Patient Outreach (Signed)
[place] [facility]) Care Management  05/26/2018  [name] [name] 1946/02/25 [id number]   [place] Initial Assessment  Referral Date:  05/09/2018 Referral Source:  Self referral Reason for Referral:  "Physician requested her to contact [facility]" Insurance:  [employer]   Outreach Attempt:  Outreach attempt #1 to patient for initial telephone assessment. No answer and unable to leave voicemail message due to message center being full.  Plan:  RN Health Coach will send unsuccessful outreach letter to patient.  RN Health Coach will make another outreach attempt to patient within 3-4 business days if no return call back from patient.   [place] [phone] [name]

## 2018-06-02 ENCOUNTER — Ambulatory Visit: Payer: Medicare Other | Admitting: Internal Medicine

## 2018-06-02 ENCOUNTER — Encounter: Payer: Self-pay | Admitting: Gastroenterology

## 2018-06-02 ENCOUNTER — Ambulatory Visit (AMBULATORY_SURGERY_CENTER): Payer: Medicare Other | Admitting: Gastroenterology

## 2018-06-02 VITALS — BP 157/80 | HR 77 | Temp 97.8°F | Resp 13 | Ht 65.0 in | Wt 219.0 lb

## 2018-06-02 DIAGNOSIS — R1310 Dysphagia, unspecified: Secondary | ICD-10-CM

## 2018-06-02 DIAGNOSIS — K317 Polyp of stomach and duodenum: Secondary | ICD-10-CM | POA: Diagnosis not present

## 2018-06-02 DIAGNOSIS — K2950 Unspecified chronic gastritis without bleeding: Secondary | ICD-10-CM | POA: Diagnosis not present

## 2018-06-02 MED ORDER — SODIUM CHLORIDE 0.9 % IV SOLN: 500.0000 mL | Freq: Once | INTRAVENOUS | Status: DC

## 2018-06-02 NOTE — Progress Notes (Signed)
To PACU, VSS. Report to [email or web address] 

## 2018-06-02 NOTE — Patient Instructions (Signed)
Discharge instructions given. Handout on a dilatation diet. Resume previous medications. YOU HAD AN ENDOSCOPIC PROCEDURE TODAY AT THE  ENDOSCOPY CENTER:   Refer to the procedure report that was given to you for any specific questions about what was found during the examination.  If the procedure report does not answer your questions, please call your gastroenterologist to clarify.  If you requested that your care partner not be given the details of your procedure findings, then the procedure report has been included in a sealed envelope for you to review at your convenience later.  YOU SHOULD EXPECT: Some feelings of bloating in the abdomen. Passage of more gas than usual.  Walking can help get rid of the air that was put into your GI tract during the procedure and reduce the bloating. If you had a lower endoscopy (such as a colonoscopy or flexible sigmoidoscopy) you may notice spotting of blood in your stool or on the toilet paper. If you underwent a bowel prep for your procedure, you may not have a normal bowel movement for a few days.  Please Note:  You might notice some irritation and congestion in your nose or some drainage.  This is from the oxygen used during your procedure.  There is no need for concern and it should clear up in a day or so.  SYMPTOMS TO REPORT IMMEDIATELY:    Following upper endoscopy (EGD)  Vomiting of blood or coffee ground material  New chest pain or pain under the shoulder blades  Painful or persistently difficult swallowing  New shortness of breath  Fever of 100F or higher  Black, tarry-looking stools  For urgent or emergent issues, a gastroenterologist can be reached at any hour by calling [phone].   DIET:  We do recommend a small meal at first, but then you may proceed to your regular diet.  Drink plenty of fluids but you should avoid alcoholic beverages for 24 hours.  ACTIVITY:  You should plan to take it easy for the rest of today and you  should NOT DRIVE or use heavy machinery until tomorrow (because of the sedation medicines used during the test).    FOLLOW UP: Our staff will call the number listed on your records the next business day following your procedure to check on you and address any questions or concerns that you may have regarding the information given to you following your procedure. If we do not reach you, we will leave a message.  However, if you are feeling well and you are not experiencing any problems, there is no need to return our call.  We will assume that you have returned to your regular daily activities without incident.  If any biopsies were taken you will be contacted by phone or by letter within the next 1-3 weeks.  Please call us at [phone] if you have not heard about the biopsies in 3 weeks.    SIGNATURES/CONFIDENTIALITY: You and/or your care partner have signed paperwork which will be entered into your electronic medical record.  These signatures attest to the fact that that the information above on your After Visit Summary has been reviewed and is understood.  Full responsibility of the confidentiality of this discharge information lies with you and/or your care-partner. 

## 2018-06-02 NOTE — Op Note (Signed)
[place] Patient Name: [name] [name] Procedure Date: 06/02/2018 10:21 AM MRN: [id number] Endoscopist: Remo Lipps P. Havery Moros , MD Age: 72 Referring MD:  Date of Birth: 13-Apr-1946 Gender: Female Account #: [id number] Procedure:                Upper GI endoscopy Indications:              Dysphagia to pills and solids Medicines:                Monitored Anesthesia Care Procedure:                Pre-Anesthesia Assessment:                           - Prior to the procedure, a History and Physical                            was performed, and patient medications and                            allergies were reviewed. The patient's tolerance of                            previous anesthesia was also reviewed. The risks                            and benefits of the procedure and the sedation                            options and risks were discussed with the patient.                            All questions were answered, and informed consent                            was obtained. Prior Anticoagulants: The patient has                            taken no previous anticoagulant or antiplatelet                            agents. ASA Grade Assessment: III - A patient with                            severe systemic disease. After reviewing the risks                            and benefits, the patient was deemed in                            satisfactory condition to undergo the procedure.                           After obtaining informed consent, the endoscope was  passed under direct vision. Throughout the                            procedure, the patient's blood pressure, pulse, and                            oxygen saturations were monitored continuously. The                            Model GIF-[id number]) scope was introduced                            through the mouth, and advanced to the second part                            of duodenum.  The upper GI endoscopy was                            accomplished without difficulty. The patient                            tolerated the procedure well. Scope In: Scope Out: Findings:                 Esophagogastric landmarks were identified: the                            Z-line was found at 35 cm, the gastroesophageal                            junction was found at 35 cm and the upper extent of                            the gastric folds was found at 35 cm from the                            incisors.                           The exam of the esophagus was otherwise normal. No                            focal appreciable stenosis or stricture.                           A guidewire was placed and the scope was withdrawn.                            Empiric dilation was performed in the entire                            esophagus with a Savary dilator with mild  resistance at 17 mm and 18 mm. Relook endoscopy                            showed no heme or mucosal wrents.                           A single small sessile flat polyp was found on the                            greater curvature of the gastric body, with altered                            pigmentation. Biopsies were taken with a cold                            forceps for histology.                           The exam of the stomach was otherwise normal.                           The duodenal bulb and second portion of the                            duodenum were normal. Complications:            No immediate complications. Estimated blood loss:                            Minimal. Estimated Blood Loss:     Estimated blood loss was minimal. Impression:               - Esophagogastric landmarks identified.                           - Normal esophagus otherwise                           - Empiric dilation performed to 35mm                           - A single benign appearing gastric polyp.  Biopsied.                           - Normal duodenal bulb and second portion of the                            duodenum. Recommendation:           - Patient has a contact number available for                            emergencies. The signs and symptoms of potential                            delayed complications were discussed with the  patient. Return to normal activities tomorrow.                            Written discharge instructions were provided to the                            patient.                           - Resume previous diet.                           - Continue present medications.                           - Await pathology results.                           - Await course post dilation Steven P. Armbruster, MD 06/02/2018 10:41:33 AM This report has been signed electronically.

## 2018-06-02 NOTE — Progress Notes (Signed)
Called to room to assist during endoscopic procedure.  Patient ID and intended procedure confirmed with present staff. Received instructions for my participation in the procedure from the performing physician.  

## 2018-06-03 ENCOUNTER — Other Ambulatory Visit: Payer: Self-pay | Admitting: *Deleted

## 2018-06-03 ENCOUNTER — Telehealth: Payer: Self-pay

## 2018-06-03 NOTE — Telephone Encounter (Signed)
  Follow up Call-  Call back number 06/02/2018  Post procedure Call Back phone  # [phone]  Permission to leave phone message Yes  Some recent data might be hidden     Patient questions:  Do you have a fever, pain , or abdominal swelling? No. Pain Score  0 *  Have you tolerated food without any problems? Yes.    Have you been able to return to your normal activities? Yes.    Do you have any questions about your discharge instructions: Diet   No. Medications  No. Follow up visit  No.  Do you have questions or concerns about your Care? No.  Actions: * If pain score is 4 or above: No action needed, pain <4.

## 2018-06-03 NOTE — Patient Outreach (Signed)
[facility]) Care Management  06/03/2018  [name] [name] [birth year] [id number]   [place] Initial Assessment  Referral Date:05/09/2018 Referral Source:Self referral Reason for Referral:"Physician requested her to contact [facility]" [place] Medicare   Outreach Attempt:  Attempted outreach for initial telephone assessment with patient.  Noted patient had Endoscopy with dilatation of esophagus on yesterday.  Patient answered and confirmed HIPAA.  Due to procedure done yesterday, patient did not feel up to completing initial telephone assessment at this time and is requesting call back.   Plan:  RN Health Coach will attempt another outreach to complete initial telephone assessment.  [place] [phone] [email or web address]

## 2018-06-08 ENCOUNTER — Encounter: Payer: Self-pay | Admitting: *Deleted

## 2018-06-08 ENCOUNTER — Other Ambulatory Visit: Payer: Self-pay | Admitting: *Deleted

## 2018-06-08 DIAGNOSIS — H4321 Crystalline deposits in vitreous body, right eye: Secondary | ICD-10-CM | POA: Diagnosis not present

## 2018-06-08 DIAGNOSIS — H04123 Dry eye syndrome of bilateral lacrimal glands: Secondary | ICD-10-CM | POA: Diagnosis not present

## 2018-06-08 DIAGNOSIS — H10413 Chronic giant papillary conjunctivitis, bilateral: Secondary | ICD-10-CM | POA: Diagnosis not present

## 2018-06-08 DIAGNOSIS — E119 Type 2 diabetes mellitus without complications: Secondary | ICD-10-CM | POA: Diagnosis not present

## 2018-06-08 DIAGNOSIS — H40053 Ocular hypertension, bilateral: Secondary | ICD-10-CM | POA: Diagnosis not present

## 2018-06-08 LAB — HM DIABETES EYE EXAM

## 2018-06-08 NOTE — Patient Outreach (Signed)
[place] [facility]) Care Management  [place]  06/08/2018   [name] [name] [birth year] [id number]   [place] Initial Assessment   Referral Date:  05/09/2018 Referral Source:  Self referral Reason for Referral:  "Physician requested her to contact [facility]" Insurance:  [employer] Attempt:  Successful telephone outreach to patient for initial telephone assessment.  HIPAA verified with patient.  Patient completed initial telephone assessment.  Social:  Patient lives at home with a friend.  Reports being independent with ADLs and IADLs with her daughters assisting with grocery shopping.  Denies any falls in the last year and ambulates with a cane at all times.  Patient reports she uses [employer] for transportation to medical appointments and at times when [employer] is not available her daughter transports her.  DME in the home include:  Straight cane, rolling walker, shower chair without a back, eyeglasses, blood pressure cuff, CBG meter, and scale.  Conditions:  Per chart review and discussion with patient, PMH include but not limited to:  Anemia, arthritis, coronary artery disease, chronic pain syndrome, depression, diabetes, dyslipidemia, GERD, hypertension, peripheral vascular disease, diabetic neuropathy, cholecystectomy, lumbar surgery, and bilateral rotator cuff surgery.  Reports not recent emergency room visits or hospital admissions.  States she just underwent an Upper Endoscopy with esophageal dilatation on 06/02/2018.  Denies any trouble swallowing at this time.  Reports last Hgb A1C was 8.1 on 03/03/2018.  Monitors blood sugars three times a day.  This mornings fasting blood sugar was 132 with fasting ranges of 110-150's.  Does not weigh daily.  Medications:   Patient reports taking about 11 medications.  Reports she manages her medication herself without difficulties and uses a weekly pill box.  Denies any issues affording her  medications.  Encounter Medications:  Outpatient Encounter Medications as of 06/08/2018  Medication Sig Note  . ACCU-CHEK FASTCLIX LANCETS MISC Check blood sugar 3 times a day   . aspirin 81 MG tablet Take 1 tablet (81 mg total) by mouth daily.   [name] atenolol (TENORMIN) 100 MG tablet Take 1 tablet (100 mg total) by mouth daily.   [name] atorvastatin (LIPITOR) 10 MG tablet Take 1 tablet (10 mg total) by mouth daily.   . Blood Glucose Monitoring Suppl (ACCU-CHEK GUIDE) w/Device KIT 1 each by Does not apply route 3 (three) times daily.   . calcipotriene (DOVONOX) 0.005 % cream    . furosemide (LASIX) 40 MG tablet take 1 tablet by mouth once daily   . glucose blood (ACCU-CHEK GUIDE) test strip Check blood sugar 3 times a day   . Insulin Glargine (LANTUS SOLOSTAR) 100 UNIT/ML Solostar Pen Inject 12 Units into the skin daily at 10 pm.   . Insulin Pen Needle 32G X 4 MM MISC Use to inject victoza one time a day and lantus one time a day   . isosorbide mononitrate (IMDUR) 30 MG 24 hr tablet TAKE 1 TABLET BY MOUTH ONCE DAILY   . lidocaine (XYLOCAINE) 5 % ointment    . liraglutide (VICTOZA) 18 MG/3ML SOPN ADMINISTER 1.2 MG UNDER THE SKIN DAILY   . loratadine (CLARITIN) 10 MG tablet Take 1 tablet (10 mg total) by mouth daily.   . Melatonin 1 MG TABS Take 1 tablet (1 mg total) by mouth at bedtime.   . metFORMIN (GLUCOPHAGE) 1000 MG tablet Take 1 tablet (1,000 mg total) by mouth 2 (two) times daily with a meal.   . nitroGLYCERIN (NITROSTAT) 0.4 MG SL tablet  Place 1 tablet (0.4 mg total) under the tongue every 5 (five) minutes as needed for chest pain.   [name] omeprazole (PRILOSEC) 40 MG capsule Take 40 mg by mouth 2 (two) times daily.   [name] PARoxetine (PAXIL) 40 MG tablet TAKE 1 TABLET BY MOUTH EVERY MORNING   . pioglitazone (ACTOS) 15 MG tablet Take 1 tablet (15 mg total) by mouth daily.   . polyethylene glycol (MIRALAX) packet Take 17 g by mouth daily.   . pregabalin (LYRICA) 150 MG capsule Take 1 capsule (150 mg  total) by mouth daily.   . traMADol (ULTRAM) 50 MG tablet Take 1 tablet (50 mg total) by mouth 2 (two) times daily.   . vitamin B-12 (CYANOCOBALAMIN) 1000 MCG tablet Take 1,000 mcg by mouth daily.   [name] amLODipine (NORVASC) 5 MG tablet Take 1 tablet (5 mg total) by mouth daily. 06/08/2018: Continues to take daily  . dihydroergotamine (MIGRANAL) 4 MG/ML nasal spray  06/08/2018: Reports not taking   No facility-administered encounter medications on file as of 06/08/2018.     Functional Status:  In your present state of health, do you have any difficulty performing the following activities: 06/08/2018 03/03/2018  Hearing? N N  Vision? N N  Difficulty concentrating or making decisions? N N  Walking or climbing stairs? [name]  Comment difficulties climbing stairs -  Dressing or bathing? N N  Doing errands, shopping? [name]  Comment daughter does grocery shopping -  Conservation officer, nature and eating ? N -  Using the Toilet? N -  In the past six months, have you accidently leaked urine? N -  Do you have problems with loss of bowel control? N -  Managing your Medications? N -  Managing your Finances? N -  Housekeeping or managing your Housekeeping? N -  Some recent data might be hidden    Fall/Depression Screening: Fall Risk  06/08/2018 03/03/2018 11/18/2017  Falls in the past year? No No No  Comment - - -  Number falls in past yr: - - -  Injury with Fall? - - -  Comment - - -  Risk Factor Category  - - -  Risk for fall due to : - Impaired balance/gait Impaired balance/gait  Risk for fall due to: Comment - - -  Follow up - - -   PHQ 2/9 Scores 06/08/2018 03/03/2018 11/18/2017 06/17/2017 05/24/2017 04/08/2017 03/10/2017  PHQ - 2 Score 0 0 0 0 0 0 0  Exception Documentation - - - - - - -    THN CM Care Plan Problem One     Most Recent Value  Care Plan Problem One  Knowledge deficiet related to self care management of diabetes.  Role Documenting the Problem One  [place] for Problem One  Active   THN Long Term Goal   Patient will reduce Hgb A1C by 0.5 points in the next 90 days. (current is 8.1)  THN Long Term Goal Start Date  06/08/18  Interventions for Problem One Long Term Goal  Current care plan and goals reviewed and discussed with patient, encouraged to make follow up appointment with primary care provider, reviewed medications and indications and encouraged compliance, discussed current Hgb A1C and physicians/patient goal, discussed ways to reduce Hgb A1C, encouraged healthier food and drink options, fall precautions reviewed and discussed, encouraged patient to increase activity as tolerated, discussed exercises inside the home  [facility] CM Short Term Goal #1   Patient will review Diabetes Educational  Booklet in the next 30 days.  THN CM Short Term Goal #1 Start Date  06/08/18  Interventions for Short Term Goal #1  Sending patient Living Well with Diabetes Educational Booklet, encouraged patient to review booklet when it arrives, encouraged patient to have daughters review education with her if she did not understand something, encouraged patient to write down questions to discuss if she does not understand something  THN CM Short Term Goal #2   Patient will report fasting blood sugars less than 150's in the next 30 days.  THN CM Short Term Goal #2 Start Date  06/08/18  Interventions for Short Term Goal #2  Encouraged patient to continue to monitor blood sugars three times a day, discussed current fasting blood sugars and how to reduce the ranges, sending 2019 Calendar Booklet to assist with documentation of blood sugars, discussed low carbohydrate diet and monitoring sugar/carbohydrate intake      Appointments:  Patient reports last seeing her primary care provider, Dr. Lynnae January on 03/03/2018.  States she had to cancel her follow up appointment due to it being scheduled on the same day as her Upper Endoscopy.  Patient stating she is planning to reschedule appointment for next month.   Encouraged patient to reschedule medical appointment.  Advanced Directives:  Denies having Advance Directive in place.  Requesting information to create one.   Consent:  [facility] services reviewed and discussed.  Patient verbally agrees to monthly telephone outreaches.  Plan: RN Health Coach will send primary MD barriers letter. RN Health Coach will route initial telephone assessment note to primary MD. [place] will send patient [place]. RN Health Coach will send patient 2019 Calendar Booklet. RN Health Coach will send patient Living Well with Diabetes Educational Packet. RN Health Coach will send patient Emergency planning/management officer. RN Health Coach will make next monthly outreach to patient in the month of September.  [place] [phone] [email or web address]

## 2018-06-09 ENCOUNTER — Encounter: Payer: Self-pay | Admitting: Gastroenterology

## 2018-06-10 ENCOUNTER — Other Ambulatory Visit: Payer: Self-pay | Admitting: Internal Medicine

## 2018-06-10 DIAGNOSIS — Z1231 Encounter for screening mammogram for malignant neoplasm of breast: Secondary | ICD-10-CM

## 2018-06-17 ENCOUNTER — Other Ambulatory Visit: Payer: Self-pay | Admitting: Internal Medicine

## 2018-06-21 ENCOUNTER — Encounter: Payer: Self-pay | Admitting: *Deleted

## 2018-06-30 ENCOUNTER — Other Ambulatory Visit: Payer: Self-pay | Admitting: Sports Medicine

## 2018-07-06 ENCOUNTER — Other Ambulatory Visit: Payer: Self-pay | Admitting: Cardiology

## 2018-07-06 ENCOUNTER — Other Ambulatory Visit: Payer: Self-pay | Admitting: *Deleted

## 2018-07-06 NOTE — Patient Outreach (Signed)
[facility]) Care Management  07/06/2018  [name] [name] 09-02-1946 [id number]   [place] Monthly Outreach  Referral Date:05/09/2018 Referral Source:Self referral Reason for Referral:"Physician requested her to contact [facility]" [place] Medicare   Outreach Attempt:  Outreach attempt #1 to patient for monthly follow up. No answer and unable to leave voicemail message due to mailbox being full.  Plan:  RN Health Coach will send unsuccessful outreach letter to patient.  RN Health Coach will make another outreach attempt to patient within the next 10 business days.  [place] Coach [phone] [name]

## 2018-07-08 ENCOUNTER — Encounter: Payer: Self-pay | Admitting: Podiatry

## 2018-07-08 ENCOUNTER — Ambulatory Visit (INDEPENDENT_AMBULATORY_CARE_PROVIDER_SITE_OTHER): Payer: Medicare Other | Admitting: Podiatry

## 2018-07-08 DIAGNOSIS — B351 Tinea unguium: Secondary | ICD-10-CM

## 2018-07-08 DIAGNOSIS — E1142 Type 2 diabetes mellitus with diabetic polyneuropathy: Secondary | ICD-10-CM | POA: Diagnosis not present

## 2018-07-08 DIAGNOSIS — M79675 Pain in left toe(s): Secondary | ICD-10-CM | POA: Diagnosis not present

## 2018-07-08 DIAGNOSIS — E1151 Type 2 diabetes mellitus with diabetic peripheral angiopathy without gangrene: Secondary | ICD-10-CM | POA: Diagnosis not present

## 2018-07-08 DIAGNOSIS — M79674 Pain in right toe(s): Secondary | ICD-10-CM

## 2018-07-11 ENCOUNTER — Other Ambulatory Visit: Payer: Self-pay | Admitting: *Deleted

## 2018-07-11 NOTE — Patient Outreach (Signed)
[facility]) Care Management  07/11/2018  [name] [name] [birth year] [id number]   [place] Monthly Outreach  Referral Date:05/09/2018 Referral Source:Self referral Reason for Referral:"Physician requested her to contact [employer]" [place] Medicare   Outreach Attempt:  Outreach attempt #2 to patient for monthly follow up. No answer and unable to leave voicemail message due to message center being full.  Plan:  RN Health Coach will make another outreach attempt in the month of October.  [place] [phone] [email or web address]

## 2018-07-12 NOTE — Progress Notes (Signed)
Subjective: [name] [name] presents today for diabetic foot care.  She has h/o diabetes, diabetic neuropathy, PAD and painful mycotic toenails. Pain is aggravated when wearing enclosed shoe gear.and relieved with periodic professional debridement.   [name] [name] related painul right 3rd digit was hurting last week and she had a little bleeding from the toe. She cut the toenail and said it provided relief. She relates it's still a little tender. She denies any pus, swelling or more drainage from the digit.  Objective: Vascular Examination: Capillary refill time <3 seconds x 10 digits Dorsalis pedis pulses diminished b/l Posterior tibial pulses absent b/l No digital hair x 10 digits Skin temperature warm to cool b/l  Dermatological Examination: Skin thin, shiny and atrophic b/l Toenails 1-5 b/l discolored, thick, dystrophic with subungual debris and pain with palpation to nailbeds due to thickness of nails.  Right 3rd digit with thick, discolored, elongated nail plate with incurvation noted to lateral border with tenderness to palpation. No edema, no erythema, no drainage.   Musculoskeletal: Muscle strength 5/5 to all LE muscle groups HAV with bunions b/l  Neurological: Sensation diminished with 10 gram monofilament. Vibratory sensation diminished  Assessment: 1. Painful onychomycosis toenails 1-5 b/l 2. NIDDM with Peripheral arterial disease 3. Diabetic neuropathy  Plan: 1. Continue diabetic foot care principles.  2. Toenails 1-5 b/l were debrided in length and girth without iatrogenic bleeding. Offending nail border debrided and curretaged right 3rd digit with noted symptom relief. Triple antibiotic ointment applied to digit. No further treatment required. 3. Patient to continue soft, supportive shoe gear 4. Patient to report any pedal injuries to medical professional  5. Follow up 3 months. Patient/POA to call should there be a concern in the interim.

## 2018-07-15 ENCOUNTER — Ambulatory Visit
Admission: RE | Admit: 2018-07-15 | Discharge: 2018-07-15 | Disposition: A | Discharge status: Home / Self Care | Payer: Medicare Other | Source: Ambulatory Visit | Attending: Internal Medicine | Admitting: Internal Medicine

## 2018-07-15 DIAGNOSIS — Z1231 Encounter for screening mammogram for malignant neoplasm of breast: Secondary | ICD-10-CM | POA: Diagnosis not present

## 2018-08-02 ENCOUNTER — Other Ambulatory Visit: Payer: Self-pay | Admitting: *Deleted

## 2018-08-02 NOTE — Patient Outreach (Signed)
[facility]) Care Management  08/02/2018  [name] [name] 08/13/1946 [id number]   [place] Monthly Outreach  Referral Date:05/09/2018 Referral Source:Self referral Reason for Referral:"Physician requested her to contact [facility]" [place] [employer]   Outreach Attempt:  Outreach attempt #3 to patient for monthly follow up.  Patient answered and stated she was unable to speak at this time.  Requesting call back at another time.   Plan:  RN Health Coach will make another outreach attempt within the next 10 business days.  [place] [phone] [email or web address]

## 2018-08-04 ENCOUNTER — Ambulatory Visit (INDEPENDENT_AMBULATORY_CARE_PROVIDER_SITE_OTHER): Payer: Medicare Other | Admitting: Internal Medicine

## 2018-08-04 ENCOUNTER — Encounter: Payer: Self-pay | Admitting: Internal Medicine

## 2018-08-04 ENCOUNTER — Other Ambulatory Visit: Payer: Self-pay

## 2018-08-04 VITALS — BP 143/68 | HR 75 | Temp 98.0°F | Resp 12 | Ht 63.0 in | Wt 227.0 lb

## 2018-08-04 DIAGNOSIS — F339 Major depressive disorder, recurrent, unspecified: Secondary | ICD-10-CM

## 2018-08-04 DIAGNOSIS — E1122 Type 2 diabetes mellitus with diabetic chronic kidney disease: Secondary | ICD-10-CM | POA: Diagnosis not present

## 2018-08-04 DIAGNOSIS — N183 Chronic kidney disease, stage 3 unspecified: Secondary | ICD-10-CM

## 2018-08-04 DIAGNOSIS — Z23 Encounter for immunization: Secondary | ICD-10-CM

## 2018-08-04 DIAGNOSIS — Z79899 Other long term (current) drug therapy: Secondary | ICD-10-CM

## 2018-08-04 DIAGNOSIS — E1140 Type 2 diabetes mellitus with diabetic neuropathy, unspecified: Secondary | ICD-10-CM

## 2018-08-04 DIAGNOSIS — D131 Benign neoplasm of stomach: Secondary | ICD-10-CM

## 2018-08-04 DIAGNOSIS — G894 Chronic pain syndrome: Secondary | ICD-10-CM

## 2018-08-04 DIAGNOSIS — I10 Essential (primary) hypertension: Secondary | ICD-10-CM

## 2018-08-04 DIAGNOSIS — E1165 Type 2 diabetes mellitus with hyperglycemia: Secondary | ICD-10-CM

## 2018-08-04 DIAGNOSIS — F325 Major depressive disorder, single episode, in full remission: Secondary | ICD-10-CM

## 2018-08-04 DIAGNOSIS — E1142 Type 2 diabetes mellitus with diabetic polyneuropathy: Secondary | ICD-10-CM

## 2018-08-04 DIAGNOSIS — E1151 Type 2 diabetes mellitus with diabetic peripheral angiopathy without gangrene: Secondary | ICD-10-CM

## 2018-08-04 DIAGNOSIS — R1310 Dysphagia, unspecified: Secondary | ICD-10-CM

## 2018-08-04 DIAGNOSIS — R197 Diarrhea, unspecified: Secondary | ICD-10-CM

## 2018-08-04 DIAGNOSIS — K5900 Constipation, unspecified: Secondary | ICD-10-CM

## 2018-08-04 DIAGNOSIS — R1319 Other dysphagia: Secondary | ICD-10-CM

## 2018-08-04 DIAGNOSIS — IMO0002 Reserved for concepts with insufficient information to code with codable children: Secondary | ICD-10-CM

## 2018-08-04 DIAGNOSIS — Z794 Long term (current) use of insulin: Secondary | ICD-10-CM

## 2018-08-04 DIAGNOSIS — I129 Hypertensive chronic kidney disease with stage 1 through stage 4 chronic kidney disease, or unspecified chronic kidney disease: Secondary | ICD-10-CM

## 2018-08-04 DIAGNOSIS — R0600 Dyspnea, unspecified: Secondary | ICD-10-CM

## 2018-08-04 DIAGNOSIS — Z0000 Encounter for general adult medical examination without abnormal findings: Secondary | ICD-10-CM

## 2018-08-04 LAB — POCT GLYCOSYLATED HEMOGLOBIN (HGB A1C): Hemoglobin A1C: 7.4 % — AB (ref 4.0–5.6)

## 2018-08-04 LAB — GLUCOSE, CAPILLARY: Glucose-Capillary: 95 mg/dL (ref 70–99)

## 2018-08-04 MED ORDER — BENAZEPRIL HCL 20 MG PO TABS: 20.0000 mg | ORAL_TABLET | Freq: Every day | ORAL | 1 refills | Status: DC

## 2018-08-04 MED ORDER — VENLAFAXINE HCL ER 75 MG PO CP24: 75.0000 mg | ORAL_CAPSULE | Freq: Every day | ORAL | 1 refills | Status: DC

## 2018-08-04 NOTE — Assessment & Plan Note (Addendum)
This problem is chronic and uncontrolled.  She had failed gaba and we switched her to lyrica.  She continues to have stinging and burning from her toes to her calves laterally.  This is bothersome to her.  On chart review after she left, I realized her Lyrica had remained at a low dose 150 daily.  It had been decreased from 100 3 times daily to 150 daily when she had the acute renal failure.  I called her and she was able to remember that 100 3 times daily did not give her much benefit over the 150 daily.  Therefore, I did not modify the plans as she and I came up with during her visit which is to continue Lyrica at same dose and add Venlafaxine at 75 mg a day.  If this does not give her additional benefit, I can increase the dose of either the venlafaxine or the Lyrica.  I will be tapering down her Paxil as I start the venlafaxine.  PLAN : Lyrica 150 daily Venlafaxine 75 daily Titrate up either medication if this does not provide her relief

## 2018-08-04 NOTE — Progress Notes (Signed)
   Subjective:    Patient ID: [name] [name], female    DOB: Dec 13, 1945, 72 y.o.   MRN: [id number]  HPI  [name] [name] is here for DM F/U. Please see the A&P for the status of the pt's chronic medical problems.  ROS : per ROS section and in problem oriented charting. All other systems are negative.  PMHx, Soc hx, and / or Fam hx : I reviewed GI notes summarized here : I referred for eso dysphagia. EGD 8/22 with dilation. Miralax for constipation. Small flat gastric sessile polyp bx = reactive gastropathy and chronic inflammation.  Review of Systems  Respiratory: Positive for shortness of breath.        DOE  Gastrointestinal: Positive for constipation and diarrhea.  Musculoskeletal: Positive for arthralgias, back pain, gait problem, myalgias, neck pain and neck stiffness.  Neurological: Positive for weakness.  Psychiatric/Behavioral: Positive for sleep disturbance.       Objective:   Physical Exam  Constitutional: She appears well-developed and well-nourished. No distress.  HENT:  Head: Normocephalic and atraumatic.  Right Ear: External ear normal.  Left Ear: External ear normal.  Nose: Nose normal.  Eyes: Conjunctivae and EOM are normal. Right eye exhibits no discharge. Left eye exhibits no discharge.  Cardiovascular: Normal rate, regular rhythm and normal heart sounds.  Pulmonary/Chest: Effort normal and breath sounds normal.  Musculoskeletal: She exhibits edema. She exhibits no tenderness.  +1 B  Skin: Skin is warm and dry. She is not diaphoretic.  Psychiatric: She has a normal mood and affect. Her behavior is normal. Judgment and thought content normal.      Assessment & Plan:

## 2018-08-04 NOTE — Assessment & Plan Note (Addendum)
This problem is chronic and uncontrolled. A1C trend 8.5 - 8.1 - 7.4 . She is on lantus 10, metformin 1000 BUD, victoza 1.2 QD. Pioglitazone 15 was added in May. Checks CBG TID. About half are nl and half high. No hypoglycemia.  She has no retinopathy and nephropathy but she does have neuropathy.  She also has known large vessel complications.  My goal will be to get her off Lantus to decrease future risk of hypoglycemia.  Options would be to increase her Victoza to 1.8 or to an SGLT2.  She is in agreement to make changes but she wants to use up her 82-month supply of Lantus which she just got before making any changes.  PLAN:  Cont current meds Stop Lantus and increase Victoza or add SGLT2 at her next appointment

## 2018-08-04 NOTE — Assessment & Plan Note (Signed)
This problem is chronic and stable.  I showed her her creatinine trend to reassure her that her kidney function is almost normal.  She recovered from the AKI in 2018.  Her most recent creatinine was 1.05 in April.  She had no albuminuria a year ago.  She is not on an ACE inhibitor so I am starting that today for primary prevention.  PLAN : ACE BMP Microalb

## 2018-08-04 NOTE — Patient Instructions (Addendum)
1. Great job on your sugar / diabetes control!  2. Restarting benazapril for your blood pressure and to protect your kidneys  3. PAXIL - take one half pill for one week, then one half pill every other day for one week, then STOP  4. Starting venlafaxine (Effexor) for burning and stinging in feet and to replace Paxil  5. Get a blood test in 2 weeks  6. See sports medicine about your knees

## 2018-08-04 NOTE — Assessment & Plan Note (Addendum)
This problem is chronic and uncontrolled.  She has stiffness and pain basically throughout her body.  She particularly noted her knees are stiff and painful when she is walking.  If she sits for too long then she has stiffness and pain in the knee joints and also in her muscles.  Walking around her house does wear the stiffness out a little bit.  She also noticed stiffness in her hands and has to work her hands frequently.  Stiffness lasts all day long and is not just in the morning.  APAP helps to decrease her pain a little bit and when APAP does not work, she will take a tramadol.  She also commented on pain in her neck and shoulders and hands.  She is actively seeing sports medicine and intends to schedule an appointment for consideration of steroid knee injection.  I again asked her about physical therapy but she is not yet ready to commit to a course of PT.  PLAN : APAP PRN Tramadol if APAP does not work Sports medicine

## 2018-08-04 NOTE — Assessment & Plan Note (Signed)
This problem is chronic and uncontrolled.  She is on atenolol 100 and and amlodipine 5 mg.  She also is prescribed Lasix 40 mg once a day.  She checks her blood pressure at home using an upper arm cuff twice a week and states that her systolic 604 usually.  She has been on an ACE (benazepril 40) before her hospitalization and it was stopped due to acute renal failure.  Renal function normalized.  She has no microalbuminuria.  PLAN:  Cont current meds Add Benazepril 20 mg BMP today and again in 2 weeks Phone call in 2 to 4 weeks to check her blood pressure to ensure she is not hypotensive

## 2018-08-04 NOTE — Assessment & Plan Note (Signed)
She got her flu shot today

## 2018-08-04 NOTE — Assessment & Plan Note (Signed)
This problem is chronic and stable.  She has been on Paxil 40 daily for many many years.  Since I want to use the venlafaxine to also treat her diabetic neuropathy, I am going to taper her off her Paxil.  She will take 20 mg daily for a week and then 20 mg every other day for a week and then stop.  She will instead use venlafaxine.  I have notified her pharmacy to DC the Paxil.  PLAN : Taper Paxil Venlafaxine 75 daily

## 2018-08-04 NOTE — Assessment & Plan Note (Signed)
She is happy with the results from her EGD and esophageal dilation.  She had one single episode since the procedure when a pill got stuck in the side of her throat.  Otherwise, she has had no further symptoms of esophageal dysphasia.

## 2018-08-05 ENCOUNTER — Encounter: Payer: Self-pay | Admitting: Internal Medicine

## 2018-08-05 LAB — BMP8+ANION GAP
Anion Gap: 15 mmol/L (ref 10.0–18.0)
BUN/Creatinine Ratio: 20 (ref 12–28)
BUN: 16 mg/dL (ref 8–27)
CO2: 24 mmol/L (ref 20–29)
Calcium: 8.9 mg/dL (ref 8.7–10.3)
Chloride: 103 mmol/L (ref 96–106)
Creatinine, Ser: 0.81 mg/dL (ref 0.57–1.00)
GFR calc Af Amer: 83 mL/min/{1.73_m2} (ref >59)
GFR calc non Af Amer: 72 mL/min/{1.73_m2} (ref >59)
Glucose: 95 mg/dL (ref 65–99)
Potassium: 4.2 mmol/L (ref 3.5–5.2)
Sodium: 142 mmol/L (ref 134–144)

## 2018-08-05 LAB — MICROALBUMIN / CREATININE URINE RATIO
Creatinine, Urine: 135 mg/dL
Microalb/Creat Ratio: 5.4 mg/g creat (ref 0.0–30.0)
Microalbumin, Urine: 7.3 ug/mL

## 2018-08-06 ENCOUNTER — Other Ambulatory Visit: Payer: Self-pay | Admitting: Internal Medicine

## 2018-08-12 ENCOUNTER — Other Ambulatory Visit: Payer: Self-pay | Admitting: *Deleted

## 2018-08-12 NOTE — Patient Outreach (Signed)
[facility]) Care Management  08/12/2018  MARGEAUX SWANTEK 20-Aug-1946 [id number]   [place] Monthly Outreach  Referral Date:05/09/2018 Referral Source:Self referral Reason for Referral:"Physician requested her to contact [facility]" [place] [employer]   Outreach Attempt:  Outreach attempt #4 to patient for monthly follow up. No answer. RN Health Coach left HIPAA compliant voicemail message along with contact information.  Plan:  RN Health Coach will send Unsuccessful Letter.  RN Health Coach will make final outreach attempt to patient within the next 10 business days if no return call back from patient.  [place] [phone] [email or web address]

## 2018-08-18 ENCOUNTER — Other Ambulatory Visit: Payer: Medicare Other

## 2018-08-19 ENCOUNTER — Other Ambulatory Visit (INDEPENDENT_AMBULATORY_CARE_PROVIDER_SITE_OTHER): Payer: Medicare Other

## 2018-08-19 DIAGNOSIS — I10 Essential (primary) hypertension: Secondary | ICD-10-CM | POA: Diagnosis not present

## 2018-08-20 LAB — BMP8+ANION GAP
Anion Gap: 16 mmol/L (ref 10.0–18.0)
BUN/Creatinine Ratio: 15 (ref 12–28)
BUN: 13 mg/dL (ref 8–27)
CO2: 23 mmol/L (ref 20–29)
Calcium: 8.8 mg/dL (ref 8.7–10.3)
Chloride: 106 mmol/L (ref 96–106)
Creatinine, Ser: 0.85 mg/dL (ref 0.57–1.00)
GFR calc Af Amer: 79 mL/min/{1.73_m2} (ref >59)
GFR calc non Af Amer: 68 mL/min/{1.73_m2} (ref >59)
Glucose: 82 mg/dL (ref 65–99)
Potassium: 4.3 mmol/L (ref 3.5–5.2)
Sodium: 145 mmol/L — ABNORMAL HIGH (ref 134–144)

## 2018-08-22 ENCOUNTER — Encounter: Payer: Self-pay | Admitting: Internal Medicine

## 2018-08-25 ENCOUNTER — Other Ambulatory Visit: Payer: Self-pay | Admitting: *Deleted

## 2018-08-25 NOTE — Patient Outreach (Signed)
[facility]) Care Management  08/25/2018  [name] [name] [birth year] [id number]   [place] Monthly Outreach  Referral Date:05/09/2018 Referral Source:Self referral Reason for Referral:"Physician requested her to contact [facility]" [place] [employer]   Outreach Attempt:  Outreach attempt #5 to patient for monthly follow up.  Patient answered and stated she does not feel well and is requesting another call back at a later date.  Plan:  RN Health Coach will attempt another final outreach within the month of December.  [place] [phone] [email or web address]

## 2018-09-03 ENCOUNTER — Other Ambulatory Visit: Payer: Self-pay | Admitting: Internal Medicine

## 2018-09-03 DIAGNOSIS — E1165 Type 2 diabetes mellitus with hyperglycemia: Secondary | ICD-10-CM

## 2018-09-03 DIAGNOSIS — E1151 Type 2 diabetes mellitus with diabetic peripheral angiopathy without gangrene: Secondary | ICD-10-CM

## 2018-09-03 DIAGNOSIS — IMO0002 Reserved for concepts with insufficient information to code with codable children: Secondary | ICD-10-CM

## 2018-09-13 ENCOUNTER — Encounter: Payer: Self-pay | Admitting: *Deleted

## 2018-09-13 ENCOUNTER — Other Ambulatory Visit: Payer: Self-pay | Admitting: Internal Medicine

## 2018-09-13 ENCOUNTER — Other Ambulatory Visit: Payer: Self-pay | Admitting: *Deleted

## 2018-09-13 DIAGNOSIS — E1151 Type 2 diabetes mellitus with diabetic peripheral angiopathy without gangrene: Secondary | ICD-10-CM

## 2018-09-13 DIAGNOSIS — IMO0002 Reserved for concepts with insufficient information to code with codable children: Secondary | ICD-10-CM

## 2018-09-13 DIAGNOSIS — E1165 Type 2 diabetes mellitus with hyperglycemia: Secondary | ICD-10-CM

## 2018-09-13 NOTE — Patient Outreach (Signed)
[facility]) Care Management  [place]  09/14/2018   [name] [name] 1945-11-01 [id number]   [place] Quarterly Outreach   Referral Date:  05/09/2018 Referral Source:  Self referral Reason for Referral:  "Physician requested her to contact [facility]" Insurance:  [employer] Attempt:  Successful telephone outreach to patient for quarterly follow up.  HIPAA verified with patient.  Patient stating she feels better.  Felt like she had a cold a few weeks ago that she treated herself.  Denies fever or cough.  Continues to monitor blood sugars.  Fasting blood sugar this morning was 146, with fasting ranges of 130-150's.  Per patient her blood sugars are usually higher in the mornings and tends to trend down to 90-100's as the day progresses.  Latest Hgb A1C was decreased to 7.4.  Denies any hypoglycemic events.  Encounter Medications:  Outpatient Encounter Medications as of 09/13/2018  Medication Sig Note  . ACCU-CHEK FASTCLIX LANCETS MISC Check blood sugar 3 times a day   . aspirin 81 MG tablet Take 1 tablet (81 mg total) by mouth daily.   [name] atenolol (TENORMIN) 100 MG tablet TAKE 1 TABLET BY MOUTH ONCE DAILY   . atorvastatin (LIPITOR) 10 MG tablet TAKE 1 TABLET BY MOUTH DAILY   . benazepril (LOTENSIN) 20 MG tablet Take 1 tablet (20 mg total) by mouth daily.   . Blood Glucose Monitoring Suppl (ACCU-CHEK GUIDE) w/Device KIT 1 each by Does not apply route 3 (three) times daily.   . furosemide (LASIX) 40 MG tablet take 1 tablet by mouth once daily   . Insulin Glargine (LANTUS SOLOSTAR) 100 UNIT/ML Solostar Pen Inject 12 Units into the skin daily at 10 pm. 09/13/2018: Reports taking 10 units nightly  . Insulin Pen Needle 32G X 4 MM MISC Use to inject victoza one time a day and lantus one time a day   . isosorbide mononitrate (IMDUR) 30 MG 24 hr tablet TAKE 1 TABLET BY MOUTH ONCE DAILY   . liraglutide (VICTOZA) 18 MG/3ML SOPN ADMINISTER 1.2 MG  UNDER THE SKIN DAILY   . loratadine (CLARITIN) 10 MG tablet Take 1 tablet (10 mg total) by mouth daily.   . Melatonin 1 MG TABS Take 1 tablet (1 mg total) by mouth at bedtime.   . metFORMIN (GLUCOPHAGE) 1000 MG tablet Take 1 tablet (1,000 mg total) by mouth 2 (two) times daily with a meal.   . nitroGLYCERIN (NITROSTAT) 0.4 MG SL tablet PLACE 1 TABLET UNDER THE TONGUE EVERY 5 MINUTES AS NEEDED FOR CHEST PAIN   . omeprazole (PRILOSEC) 40 MG capsule Take 40 mg by mouth 2 (two) times daily.   . pioglitazone (ACTOS) 15 MG tablet Take 1 tablet (15 mg total) by mouth daily.   . polyethylene glycol (MIRALAX) packet Take 17 g by mouth daily.   . pregabalin (LYRICA) 150 MG capsule Take 1 capsule (150 mg total) by mouth daily.   . traMADol (ULTRAM) 50 MG tablet TAKE 1 TABLET BY MOUTH TWICE DAILY   . venlafaxine XR (EFFEXOR-XR) 75 MG 24 hr capsule Take 1 capsule (75 mg total) by mouth daily with breakfast.   . vitamin B-12 (CYANOCOBALAMIN) 1000 MCG tablet Take 1,000 mcg by mouth daily.   . [DISCONTINUED] glucose blood (ACCU-CHEK GUIDE) test strip Check blood sugar 3 times a day   . amLODipine (NORVASC) 5 MG tablet Take 1 tablet (5 mg total) by mouth daily. 06/08/2018: Continues to take daily  .  calcipotriene (DOVONOX) 0.005 % cream    . dihydroergotamine (MIGRANAL) 4 MG/ML nasal spray  06/08/2018: Reports not taking  . lidocaine (XYLOCAINE) 5 % ointment    . PARoxetine (PAXIL) 40 MG tablet TAKE 1 TABLET BY MOUTH EVERY MORNING (Patient not taking: Reported on 09/13/2018) 09/13/2018: Reports not taking currently   No facility-administered encounter medications on file as of 09/13/2018.     Functional Status:  In your present state of health, do you have any difficulty performing the following activities: 06/08/2018 03/03/2018  Hearing? N N  Vision? N N  Difficulty concentrating or making decisions? N N  Walking or climbing stairs? [name]  Comment difficulties climbing stairs -  Dressing or bathing? N N  Doing  errands, shopping? [name]  Comment daughter does grocery shopping -  Conservation officer, nature and eating ? N -  Using the Toilet? N -  In the past six months, have you accidently leaked urine? N -  Do you have problems with loss of bowel control? N -  Managing your Medications? N -  Managing your Finances? N -  Housekeeping or managing your Housekeeping? N -  Some recent data might be hidden    Fall/Depression Screening: Fall Risk  09/13/2018 08/04/2018 06/08/2018  Falls in the past year? 0 - No  Comment - - -  Number falls in past yr: - - -  Injury with Fall? - - -  Comment - - -  Risk Factor Category  - - -  Risk for fall due to : Impaired balance/gait;Impaired vision;Impaired mobility;Medication side effect Impaired balance/gait;Impaired vision -  Risk for fall due to: Comment - - -  Follow up Education provided;Falls prevention discussed - -   PHQ 2/9 Scores 08/04/2018 06/08/2018 03/03/2018 11/18/2017 06/17/2017 05/24/2017 04/08/2017  PHQ - 2 Score 2 0 0 0 0 0 0  PHQ- 9 Score 8 - - - - - -  Exception Documentation - - - - - - -    THN CM Care Plan Problem One     Most Recent Value  Care Plan Problem One  Knowledge deficiet related to self care management of diabetes.  Role Documenting the Problem One  [place] for Problem One  Active  THN Long Term Goal   Patient will reduce Hgb A1C by 0.2 points in the next 90 days. (current is 7.4)  THN Long Term Goal Start Date  09/13/18  THN Long Term Goal Met Date  09/13/18  Interventions for Problem One Long Term Goal  Congratulated patient on her current reduction of her Hgb A1C down to 7.4, discussed how patient has reduced blood sugars thus far, encouraged patient to continue to make healthier food and drink options, reviewed and discussed current care plan and goals, encouraged to keep and attend scheduled medical appointments, reviewed medications and medications changes and encouraged compliance  THN CM Short Term Goal #1   Patient will  review Diabetes Educational Booklet in the next 30 days.  THN CM Short Term Goal #1 Start Date  09/13/18  Interventions for Short Term Goal #1  Confirmed with patient she has recieved, encouraged patient to review booklet, encouraged patient to have daughter review booklet with her, encouraged them to write down questions concerning education or diabetes, reviewed signs and symptoms of hypo and hyperglycemia  THN CM Short Term Goal #2   Patient will report fasting blood sugars less than 150's in the next 30 days.  THN CM Short Term  Goal #2 Start Date  06/08/18  [facility] CM Short Term Goal #2 Met Date  09/13/18     Appointments:  Patient last seen primary care provider on 08/04/2018 and has scheduled follow up appointment with Dr. Lynnae January on 11/03/2017.[name]  Plan: RN Health Coach will send primary care provider Quarterly Update. RN Health Coach will make next telephone outreach to patient in the month of March.  [place] [phone] [email or web address]

## 2018-10-06 ENCOUNTER — Other Ambulatory Visit: Payer: Self-pay | Admitting: Internal Medicine

## 2018-10-06 DIAGNOSIS — E1165 Type 2 diabetes mellitus with hyperglycemia: Secondary | ICD-10-CM

## 2018-10-06 DIAGNOSIS — IMO0002 Reserved for concepts with insufficient information to code with codable children: Secondary | ICD-10-CM

## 2018-10-06 DIAGNOSIS — E1151 Type 2 diabetes mellitus with diabetic peripheral angiopathy without gangrene: Secondary | ICD-10-CM

## 2018-10-07 ENCOUNTER — Ambulatory Visit (INDEPENDENT_AMBULATORY_CARE_PROVIDER_SITE_OTHER): Payer: Medicare Other | Admitting: Podiatry

## 2018-10-07 DIAGNOSIS — E1151 Type 2 diabetes mellitus with diabetic peripheral angiopathy without gangrene: Secondary | ICD-10-CM

## 2018-10-07 DIAGNOSIS — M79675 Pain in left toe(s): Secondary | ICD-10-CM

## 2018-10-07 DIAGNOSIS — B351 Tinea unguium: Secondary | ICD-10-CM | POA: Diagnosis not present

## 2018-10-07 DIAGNOSIS — L84 Corns and callosities: Secondary | ICD-10-CM | POA: Diagnosis not present

## 2018-10-07 DIAGNOSIS — M79674 Pain in right toe(s): Secondary | ICD-10-CM | POA: Diagnosis not present

## 2018-10-07 NOTE — Patient Instructions (Addendum)
Onychomycosis/Fungal Toenails  WHAT IS IT? An infection that lies within the keratin of your nail plate that is caused by a fungus.  WHY ME? Fungal infections affect all ages, sexes, races, and creeds.  There may be many factors that predispose you to a fungal infection such as age, coexisting medical conditions such as diabetes, or an autoimmune disease; stress, medications, fatigue, genetics, etc.  Bottom line: fungus thrives in a warm, moist environment and your shoes offer such a location.  IS IT CONTAGIOUS? Theoretically, yes.  You do not want to share shoes, nail clippers or files with someone who has fungal toenails.  Walking around barefoot in the same room or sleeping in the same bed is unlikely to transfer the organism.  It is important to realize, however, that fungus can spread easily from one nail to the next on the same foot.  HOW DO WE TREAT THIS?  There are several ways to treat this condition.  Treatment may depend on many factors such as age, medications, pregnancy, liver and kidney conditions, etc.  It is best to ask your doctor which options are available to you.  1. No treatment.   Unlike many other medical concerns, you can live with this condition.  However for many people this can be a painful condition and may lead to ingrown toenails or a bacterial infection.  It is recommended that you keep the nails cut short to help reduce the amount of fungal nail. 2. Topical treatment.  These range from herbal remedies to prescription strength nail lacquers.  About 40-50% effective, topicals require twice daily application for approximately 9 to 12 months or until an entirely new nail has grown out.  The most effective topicals are medical grade medications available through physicians offices. 3. Oral antifungal medications.  With an 80-90% cure rate, the most common oral medication requires 3 to 4 months of therapy and stays in your system for a year as the new nail grows out.  Oral  antifungal medications do require blood work to make sure it is a safe drug for you.  A liver function panel will be performed prior to starting the medication and after the first month of treatment.  It is important to have the blood work performed to avoid any harmful side effects.  In general, this medication safe but blood work is required. 4. Laser Therapy.  This treatment is performed by applying a specialized laser to the affected nail plate.  This therapy is noninvasive, fast, and non-painful.  It is not covered by insurance and is therefore, out of pocket.  The results have been very good with a 80-95% cure rate.  [facility] is the only practice in the area to offer this therapy. 5. Permanent Nail Avulsion.  Removing the entire nail so that a new nail will not grow back.  Diabetes Mellitus and Foot Care Foot care is an important part of your health, especially when you have diabetes. Diabetes may cause you to have problems because of poor blood flow (circulation) to your feet and legs, which can cause your skin to:  Become thinner and drier.  Break more easily.  Heal more slowly.  Peel and crack. You may also have nerve damage (neuropathy) in your legs and feet, causing decreased feeling in them. This means that you may not notice minor injuries to your feet that could lead to more serious problems. Noticing and addressing any potential problems early is the best way to prevent   future foot problems. How to care for your feet Foot hygiene  Wash your feet daily with warm water and mild soap. Do not use hot water. Then, pat your feet and the areas between your toes until they are completely dry. Do not soak your feet as this can dry your skin.  Trim your toenails straight across. Do not dig under them or around the cuticle. File the edges of your nails with an emery board or nail file.  Apply a moisturizing lotion or petroleum jelly to the skin on your feet and to dry, brittle  toenails. Use lotion that does not contain alcohol and is unscented. Do not apply lotion between your toes. Shoes and socks  Wear clean socks or stockings every day. Make sure they are not too tight. Do not wear knee-high stockings since they may decrease blood flow to your legs.  Wear shoes that fit properly and have enough cushioning. Always look in your shoes before you put them on to be sure there are no objects inside.  To break in new shoes, wear them for just a few hours a day. This prevents injuries on your feet. Wounds, scrapes, corns, and calluses  Check your feet daily for blisters, cuts, bruises, sores, and redness. If you cannot see the bottom of your feet, use a mirror or ask someone for help.  Do not cut corns or calluses or try to remove them with medicine.  If you find a minor scrape, cut, or break in the skin on your feet, keep it and the skin around it clean and dry. You may clean these areas with mild soap and water. Do not clean the area with peroxide, alcohol, or iodine.  If you have a wound, scrape, corn, or callus on your foot, look at it several times a day to make sure it is healing and not infected. Check for: ? Redness, swelling, or pain. ? Fluid or blood. ? Warmth. ? Pus or a bad smell. General instructions  Do not cross your legs. This may decrease blood flow to your feet.  Do not use heating pads or hot water bottles on your feet. They may burn your skin. If you have lost feeling in your feet or legs, you may not know this is happening until it is too late.  Protect your feet from hot and cold by wearing shoes, such as at the beach or on hot pavement.  Schedule a complete foot exam at least once a year (annually) or more often if you have foot problems. If you have foot problems, report any cuts, sores, or bruises to your health care provider immediately. Contact a health care provider if:  You have a medical condition that increases your risk of  infection and you have any cuts, sores, or bruises on your feet.  You have an injury that is not healing.  You have redness on your legs or feet.  You feel burning or tingling in your legs or feet.  You have pain or cramps in your legs and feet.  Your legs or feet are numb.  Your feet always feel cold.  You have pain around a toenail. Get help right away if:  You have a wound, scrape, corn, or callus on your foot and: ? You have pain, swelling, or redness that gets worse. ? You have fluid or blood coming from the wound, scrape, corn, or callus. ? Your wound, scrape, corn, or callus feels warm to the touch. ? You   have pus or a bad smell coming from the wound, scrape, corn, or callus. ? You have a fever. ? You have a red line going up your leg. Summary  Check your feet every day for cuts, sores, red spots, swelling, and blisters.  Moisturize feet and legs daily.  Wear shoes that fit properly and have enough cushioning.  If you have foot problems, report any cuts, sores, or bruises to your health care provider immediately.  Schedule a complete foot exam at least once a year (annually) or more often if you have foot problems. This information is not intended to replace advice given to you by your health care provider. Make sure you discuss any questions you have with your health care provider. Document Released: 09/25/2000 Document Revised: 11/10/2017 Document Reviewed: 10/30/2016 Elsevier Interactive Patient Education  2019 [employer]  Diabetic Neuropathy Diabetic neuropathy refers to nerve damage that is caused by diabetes (diabetes mellitus). Over time, people with diabetes can develop nerve damage throughout the body. There are several types of diabetic neuropathy:  Peripheral neuropathy. This is the most common type of diabetic neuropathy. It causes damage to nerves that carry signals between the spinal cord and other parts of the body (peripheral nerves). This usually  affects nerves in the feet and legs first, and may eventually affect the hands and arms. The damage affects the ability to sense touch or temperature.  Autonomic neuropathy. This type causes damage to nerves that control involuntary functions (autonomic nerves). These nerves carry signals that control: ? Heartbeat. ? Body temperature. ? Blood pressure. ? Urination. ? Digestion. ? Sweating. ? Sexual function. ? Response to changing blood sugar (glucose) levels.  Focal neuropathy. This type of nerve damage affects one area of the body, such as an arm, a leg, or the face. The injury may involve one nerve or a small group of nerves. Focal neuropathy can be painful and unpredictable, and occurs most often in older adults with diabetes. This often develops suddenly, but usually improves over time and does not cause long-term problems.  Proximal neuropathy. This type of nerve damage affects the nerves of the thighs, hips, buttocks, or legs. It causes severe pain, weakness, and muscle death (atrophy), usually in the thigh muscles. It is more common among older men and people who have type 2 diabetes. The length of recovery time may vary. What are the causes? Peripheral, autonomic, and focal neuropathies are caused by diabetes that is not well controlled with treatment. The cause of proximal neuropathy is not known, but it may be caused by inflammation related to uncontrolled blood glucose levels. What are the signs or symptoms? Peripheral neuropathy Peripheral neuropathy develops slowly over time. When the nerves of the feet and legs no longer work, you may experience:  Burning, stabbing, or aching pain in the legs or feet.  Pain or cramping in the legs or feet.  Loss of feeling (numbness) and inability to feel pressure or pain in the feet. This can lead to: ? Thick calluses or sores on areas of constant pressure. ? Ulcers. ? Reduced ability to feel temperature changes.  Foot  deformities.  Muscle weakness.  Loss of balance or coordination. Autonomic neuropathy The symptoms of autonomic neuropathy vary depending on which nerves are affected. Symptoms may include:  Problems with digestion, such as: ? Nausea or vomiting. ? Poor appetite. ? Bloating. ? Diarrhea or constipation. ? Trouble swallowing. ? Losing weight without trying to.  Problems with the heart, blood and lungs, such as: ?   Dizziness, especially when standing up. ? Fainting. ? Shortness of breath. ? Irregular heartbeat.  Bladder problems, such as: ? Trouble starting or stopping urination. ? Leaking urine. ? Trouble emptying the bladder. ? Urinary tract infections (UTIs).  Problems with other body functions, such as: ? Sweat. You may sweat too much or too little. ? Temperature. You might get hot easily. Or, you might feel cold more than usual. ? Sexual function. Men may not be able to get or maintain an erection. Women may have vaginal dryness and difficulty with arousal. Focal neuropathy Symptoms affect only one area of the body. Common symptoms include:  Numbness.  Tingling.  Burning pain.  Prickling feeling.  Very sensitive skin.  Weakness.  Inability to move (paralysis).  Muscle twitching.  Muscles getting smaller (wasting).  Poor coordination.  Double or blurred vision. Proximal neuropathy  Sudden, severe pain in the hip, thigh, or buttocks. Pain may spread from the back into the legs (sciatica).  Pain and numbness in the arms and legs.  Tingling.  Loss of bladder control or bowel control.  Weakness and wasting of thigh muscles.  Difficulty getting up from a seated position.  Abdominal swelling.  Unexplained weight loss. How is this diagnosed? Diagnosis usually involves reviewing your medical history and any symptoms you have. Diagnosis varies depending on the type of neuropathy your health care provider suspects. Peripheral neuropathy Your health  care provider will check areas that are affected by your nervous system (neurologic exam), such as your reflexes, how you move, and what you can feel. You may have other tests, such as:  Blood tests.  Removal and examination of fluid that surrounds the spinal cord (lumbar puncture).  CT scan.  MRI.  A test to check the nerves that control muscles (electromyogram, EMG).  Tests of how quickly messages pass through your nerves (nerve conduction velocity tests).  Removal of a small piece of nerve to be examined under a microscope (biopsy). Autonomic neuropathy You may have tests, such as:  Tests to measure your blood pressure and heart rate. This may include monitoring you while you are safely secured to an exam table that moves you from a lying position to an upright position (table tilt test).  Breathing tests to check your lungs.  Tests to check how food moves through the digestive system (gastric emptying tests).  Blood, sweat, or urine tests.  Ultrasound of your bladder.  Spinal fluid tests. Focal neuropathy This condition may be diagnosed with:  A neurologic exam.  CT scan.  MRI.  EMG.  Nerve conduction velocity tests. Proximal neuropathy There is no test to diagnose this type of neuropathy. You may have tests to rule out other possible causes of this type of neuropathy. Tests may include:  X-rays of your spine and lumbar region.  Lumbar puncture.  MRI. How is this treated? The goal of treatment is to keep nerve damage from getting worse. The most important part of treatment is keeping your blood glucose level and your A1C level within your target range by following your diabetes management plan. Over time, maintaining lower blood glucose levels helps lessen symptoms. In some cases, you may need prescription pain medicine. Follow these instructions at home:  Lifestyle   Do not use any products that contain nicotine or tobacco, such as cigarettes and  e-cigarettes. If you need help quitting, ask your health care provider.  Be physically active every day. Include strength training and balance exercises.  Follow a healthy meal plan.    Work with your health care provider to manage your blood pressure. General instructions  Follow your diabetes management plan as directed. ? Check your blood glucose levels as directed by your health care provider. ? Keep your blood glucose in your target range as directed by your health care provider. ? Have your A1C level checked at least two times a year, or as often as told by your health care provider.  Take over the counter and prescription medicines only as told by your health care provider. This includes insulin and diabetes medicine.  Do not drive or use heavy machinery while taking prescription pain medicines.  Check your skin and feet every day for cuts, bruises, redness, blisters, or sores.  Keep all follow up visits as told by your health care provider. This is important. Contact a health care provider if:  You have burning, stabbing, or aching pain in your legs or feet.  You are unable to feel pressure or pain in your feet.  You develop problems with digestion, such as: ? Nausea. ? Vomiting. ? Bloating. ? Constipation. ? Diarrhea. ? Abdominal pain.  You have difficulty with urination, such as inability: ? To control when you urinate (incontinence). ? To completely empty the bladder (retention).  You have palpitations.  You feel dizzy, weak, or faint when you stand up. Get help right away if:  You cannot urinate.  You have sudden weakness or loss of coordination.  You have trouble speaking.  You have pain or pressure in your chest.  You have an irregular heart beat.  You have sudden inability to move a part of your body. Summary  Diabetic neuropathy refers to nerve damage that is caused by diabetes. It can affect nerves throughout the entire body, causing numbness  and pain in the arms, legs, digestive tract, heart, and other body systems.  Keep your blood glucose level and your blood pressure in your target range, as directed by your health care provider. This can help prevent neuropathy from getting worse.  Check your skin and feet every day for cuts, bruises, redness, blisters, or sores.  Do not use any products that contain nicotine or tobacco, such as cigarettes and e-cigarettes. If you need help quitting, ask your health care provider. This information is not intended to replace advice given to you by your health care provider. Make sure you discuss any questions you have with your health care provider. Document Released: 12/07/2001 Document Revised: 11/10/2017 Document Reviewed: 11/02/2016 Elsevier Interactive Patient Education  2019 [employer]  

## 2018-10-08 ENCOUNTER — Other Ambulatory Visit: Payer: Self-pay | Admitting: Cardiology

## 2018-10-12 DIAGNOSIS — Z860100 Personal history of colon polyps, unspecified: Secondary | ICD-10-CM

## 2018-10-12 HISTORY — DX: Personal history of colonic polyps: Z86.010

## 2018-10-12 HISTORY — DX: Personal history of colon polyps, unspecified: Z86.0100

## 2018-10-25 ENCOUNTER — Ambulatory Visit (INDEPENDENT_AMBULATORY_CARE_PROVIDER_SITE_OTHER): Payer: Medicare Other | Admitting: Sports Medicine

## 2018-10-25 VITALS — BP 137/54 | Ht 63.0 in | Wt 240.0 lb

## 2018-10-25 DIAGNOSIS — M25472 Effusion, left ankle: Secondary | ICD-10-CM

## 2018-10-25 MED ORDER — TRAMADOL HCL 50 MG PO TABS: 50.0000 mg | ORAL_TABLET | Freq: Two times a day (BID) | ORAL | 0 refills | Status: DC

## 2018-10-26 ENCOUNTER — Encounter (HOSPITAL_COMMUNITY): Payer: Self-pay | Admitting: Emergency Medicine

## 2018-10-26 ENCOUNTER — Encounter: Payer: Self-pay | Admitting: Sports Medicine

## 2018-10-26 ENCOUNTER — Emergency Department (HOSPITAL_BASED_OUTPATIENT_CLINIC_OR_DEPARTMENT_OTHER): Payer: Medicare Other

## 2018-10-26 ENCOUNTER — Emergency Department (HOSPITAL_COMMUNITY)
Admission: EM | Admit: 2018-10-26 | Discharge: 2018-10-26 | Disposition: A | Discharge status: Home / Self Care | Payer: Medicare Other | Attending: Emergency Medicine | Admitting: Emergency Medicine

## 2018-10-26 DIAGNOSIS — I130 Hypertensive heart and chronic kidney disease with heart failure and stage 1 through stage 4 chronic kidney disease, or unspecified chronic kidney disease: Secondary | ICD-10-CM | POA: Diagnosis not present

## 2018-10-26 DIAGNOSIS — I2510 Atherosclerotic heart disease of native coronary artery without angina pectoris: Secondary | ICD-10-CM | POA: Diagnosis not present

## 2018-10-26 DIAGNOSIS — N183 Chronic kidney disease, stage 3 (moderate): Secondary | ICD-10-CM | POA: Insufficient documentation

## 2018-10-26 DIAGNOSIS — E785 Hyperlipidemia, unspecified: Secondary | ICD-10-CM | POA: Insufficient documentation

## 2018-10-26 DIAGNOSIS — Z7722 Contact with and (suspected) exposure to environmental tobacco smoke (acute) (chronic): Secondary | ICD-10-CM | POA: Insufficient documentation

## 2018-10-26 DIAGNOSIS — E1122 Type 2 diabetes mellitus with diabetic chronic kidney disease: Secondary | ICD-10-CM | POA: Diagnosis not present

## 2018-10-26 DIAGNOSIS — Z7982 Long term (current) use of aspirin: Secondary | ICD-10-CM | POA: Diagnosis not present

## 2018-10-26 DIAGNOSIS — M79609 Pain in unspecified limb: Secondary | ICD-10-CM | POA: Diagnosis not present

## 2018-10-26 DIAGNOSIS — Z794 Long term (current) use of insulin: Secondary | ICD-10-CM | POA: Insufficient documentation

## 2018-10-26 DIAGNOSIS — I5032 Chronic diastolic (congestive) heart failure: Secondary | ICD-10-CM | POA: Diagnosis not present

## 2018-10-26 DIAGNOSIS — R2242 Localized swelling, mass and lump, left lower limb: Secondary | ICD-10-CM | POA: Diagnosis not present

## 2018-10-26 DIAGNOSIS — R600 Localized edema: Secondary | ICD-10-CM | POA: Diagnosis not present

## 2018-10-26 DIAGNOSIS — Z79899 Other long term (current) drug therapy: Secondary | ICD-10-CM | POA: Insufficient documentation

## 2018-10-26 LAB — URINALYSIS, ROUTINE W REFLEX MICROSCOPIC
Bacteria, UA: NONE SEEN
Bilirubin Urine: NEGATIVE
Glucose, UA: NEGATIVE mg/dL
Hgb urine dipstick: NEGATIVE
Ketones, ur: NEGATIVE mg/dL
Nitrite: NEGATIVE
Protein, ur: NEGATIVE mg/dL
Specific Gravity, Urine: 1.023 (ref 1.005–1.030)
pH: 5 (ref 5.0–8.0)

## 2018-10-26 LAB — CBC WITH DIFFERENTIAL/PLATELET
Abs Immature Granulocytes: 0.03 10*3/uL (ref 0.00–0.07)
Basophils Absolute: 0 10*3/uL (ref 0.0–0.1)
Basophils Relative: 1 %
Eosinophils Absolute: 0 10*3/uL (ref 0.0–0.5)
Eosinophils Relative: 0 %
HCT: 31.4 % — ABNORMAL LOW (ref 36.0–46.0)
Hemoglobin: 10.5 g/dL — ABNORMAL LOW (ref 12.0–15.0)
Immature Granulocytes: 0 %
Lymphocytes Relative: 42 %
Lymphs Abs: 3.2 10*3/uL (ref 0.7–4.0)
MCH: 29.1 pg (ref 26.0–34.0)
MCHC: 33.4 g/dL (ref 30.0–36.0)
MCV: 87 fL (ref 80.0–100.0)
Monocytes Absolute: 0.5 10*3/uL (ref 0.1–1.0)
Monocytes Relative: 7 %
Neutro Abs: 3.9 10*3/uL (ref 1.7–7.7)
Neutrophils Relative %: 50 %
Platelets: 303 10*3/uL (ref 150–400)
RBC: 3.61 MIL/uL — ABNORMAL LOW (ref 3.87–5.11)
RDW: 14.8 % (ref 11.5–15.5)
WBC: 7.8 10*3/uL (ref 4.0–10.5)
nRBC: 0 % (ref 0.0–0.2)

## 2018-10-26 LAB — BASIC METABOLIC PANEL
Anion gap: 12 (ref 5–15)
BUN: 15 mg/dL (ref 8–23)
CO2: 27 mmol/L (ref 22–32)
Calcium: 8.7 mg/dL — ABNORMAL LOW (ref 8.9–10.3)
Chloride: 107 mmol/L (ref 98–111)
Creatinine, Ser: 0.85 mg/dL (ref 0.44–1.00)
GFR calc Af Amer: >60 mL/min (ref >60)
GFR calc non Af Amer: >60 mL/min (ref >60)
Glucose, Bld: 108 mg/dL — ABNORMAL HIGH (ref 70–99)
Potassium: 4 mmol/L (ref 3.5–5.1)
Sodium: 146 mmol/L — ABNORMAL HIGH (ref 135–145)

## 2018-10-26 MED ORDER — MORPHINE SULFATE (PF) 4 MG/ML IV SOLN
4.0000 mg | Freq: Once | INTRAVENOUS | Status: AC
  Administered 2018-10-26: 4 mg via INTRAVENOUS
  Filled 2018-10-26: qty 1

## 2018-10-26 MED ORDER — ONDANSETRON HCL 4 MG/2ML IJ SOLN
4.0000 mg | Freq: Once | INTRAMUSCULAR | Status: AC
  Administered 2018-10-26: 4 mg via INTRAVENOUS
  Filled 2018-10-26: qty 2

## 2018-10-26 NOTE — Progress Notes (Signed)
Left lower extremity venous duplex has been completed.   Preliminary results in CV Proc.   [name] [name] 10/26/2018 1:12 PM

## 2018-10-26 NOTE — ED Notes (Signed)
Pt transported to Vascular 

## 2018-10-26 NOTE — ED Triage Notes (Signed)
Pt reports one week of leg swelling and pain in calf.

## 2018-10-26 NOTE — Progress Notes (Signed)
   Subjective:    Patient ID: [name] [name], female    DOB: 30-Oct-1945, 73 y.o.   MRN: [id number]  HPI chief complaint: Left ankle swelling  Patient comes in today complaining of 4 days of left ankle swelling.  Swelling began without any specific injury.  She has diffuse pain around the ankle.  Difficulty walking.  She is in a wheelchair today.  She is here today with her daughter.    Review of Systems As above    Objective:   Physical Exam  Well-developed, well-nourished.  No acute distress.  Awake alert and oriented x3.  Vital signs reviewed  Examination of the left lower leg shows diffuse swelling in the left calf and ankle.  She is diffusely tender to palpation.  Nonerythematous.  Calf is tender to palpation with a positive Homans.  Good pulses.      Assessment & Plan:   Left lower leg pain and swelling- rule out DVT  Transportation is a problem for this patient and her daughter.  I am concerned she has a DVT and I would like this evaluated urgently.  I have instructed both the patient and her daughter to proceed to the emergency room.  They understand that if this is a DVT then she is at risk of a pulmonary embolus. Of note, she is also requesting a refill of her tramadol.  Refill was provided today.

## 2018-10-26 NOTE — ED Provider Notes (Signed)
[facility] Provider Note   CSN: [id number] Arrival date & time: 10/26/18  1123     History   Chief Complaint Chief Complaint  Patient presents with  . Leg Swelling    HPI [name] [name] is a 73 y.o. female.  Pt presents to the ED today with left leg swelling for 1 week.  Pt went to see Dr. Micheline [facility] yesterday and showed him the leg.  He recommended that pt come to the ED for an [place].  She was unable to get to the hospital yesterday as transportation is an issue, so she came today.  She denies any sob or cp.     Past Medical History:  Diagnosis Date  . Anemia   . Arthritis   . CAD 08/26/2006   Cath 07/05:multiple areas of nonobstructive disease = medical & RF mgmt, well preserved global systolic function. Dr. Lia Foyer. Nuclear med stress test 01/2014 : Normal stress nuclear study. LV Ejection Fraction: 76%. LV Wall Motion: NL LV Function; NL Wall Motion.      . Chronic pain syndrome 01/03/2013   Multifactorial. Non opioid requiring.   L2-5 fusion, with hardware at L2-3, stable per MRI 2010. Followed by neurosurgery (Dr. 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    [name] DEPRESSION 08/26/2006   On paxil    . DIABETES MELLITUS, TYPE II 08/26/2006   Insulin dependent. Victoza added 03/2014  . DYSLIPIDEMIA 11/01/2006   LDL goal < 100, 70 if can achieve without side effects.     . Gallstones   . GERD 08/26/2006   Heartburn QHS.     [name] HYPERTENSION 08/26/2006   On BB, ACEI, lasix, norvasc, metolazone, and imdur.     . OBESITY 08/26/2006   BMI 39.5. Obesity Class 2.     . PVD (peripheral vascular disease) ([place]) 03/27/2014   2015 LE arterial Duplex - Possible mild inflow disease on the left. 0-49% bilateral SFA disease, without focal stenosis. Three vesel run-off, bilaterally. Medical tx.     Patient Active Problem List   Diagnosis Date Noted  . Esophageal dysphagia  03/03/2018  . Gait instability 03/03/2018  . Aortic atherosclerosis ([place]) 06/17/2017  . 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 Right carotid bruit 04/08/2017  . CKD stage 3 due to type 2 diabetes mellitus ([place]) 12/11/2016  . Diabetic neuropathy ([place]) 12/10/2016  . Small intestinal bacterial overgrowth 12/24/2015  . Goals of care, counseling/discussion 01/18/2015  . ASCUS with positive high risk HPV 08/03/2014  . PVD (peripheral vascular disease) ([place]) 03/27/2014  . Diastolic CHF, chronic ([place]) 12/21/2013  . Healthcare maintenance 04/25/2013  . Chronic pain syndrome 01/03/2013  . Insomnia 12/22/2011  . Vitamin B12 deficiency 12/27/2009  . Vitamin D deficiency 12/27/2009  . Anemia 12/27/2009  . Hypomagnesemia 12/29/2008  . Osteopenia 08/01/2008  . Hyperlipidemia 11/01/2006  . GLAUCOMA NOS 11/01/2006  . DM (diabetes mellitus), type 2, uncontrolled, periph vascular complic ([place]) 66/44/0347  . Obesity 08/26/2006  . Depression 08/26/2006  . HTN (hypertension) 08/26/2006  . Coronary atherosclerosis 08/26/2006  . GERD 08/26/2006    Past Surgical History:  Procedure Laterality Date  . CHOLECYSTECTOMY     pt denies 04/07/16  . endometrial biospy    . LEFT HEART CATH    . lumbar fusion surgery  10/08   L3-L5  . posterior lumbar interfusion surgery  07/18/07   L2-L3  . rotator cuff surgery Bilateral  OB History   No obstetric history on file.      Home Medications    Prior to Admission medications   Medication Sig Start Date End Date Taking? Authorizing Provider  amLODipine (NORVASC) 5 MG tablet Take 1 tablet (5 mg total) by mouth daily. 03/03/18 10/26/18 Yes Bartholomew Crews, MD  aspirin 81 MG tablet Take 1 tablet (81 mg total) by mouth daily. 04/25/13  Yes Bartholomew Crews, MD  atenolol (TENORMIN) 100 MG tablet Take 1 tablet (100 mg total) by mouth daily. Please make yearly appt with Dr. Meda Coffee for February before anymore refills. 1st attempt 10/10/18  Yes  Dorothy Spark, MD  atorvastatin (LIPITOR) 10 MG tablet TAKE 1 TABLET BY MOUTH DAILY Patient taking differently: Take 10 mg by mouth daily.  08/08/18  Yes Bartholomew Crews, MD  benazepril (LOTENSIN) 20 MG tablet Take 1 tablet (20 mg total) by mouth daily. 08/04/18 08/04/19 Yes Bartholomew Crews, MD  Insulin Glargine (LANTUS SOLOSTAR) 100 UNIT/ML Solostar Pen Inject 12 Units into the skin daily at 10 pm.   Yes [provider]  isosorbide mononitrate (IMDUR) 30 MG 24 hr tablet TAKE 1 TABLET BY MOUTH ONCE DAILY Patient taking differently: Take 30 mg by mouth daily.  04/12/18  Yes Bartholomew Crews, MD  liraglutide (VICTOZA) 18 MG/3ML SOPN ADMINISTER 1.2 MG UNDER THE SKIN DAILY 03/29/18  Yes Bartholomew Crews, MD  loratadine (CLARITIN) 10 MG tablet Take 1 tablet (10 mg total) by mouth daily. 03/03/18  Yes Bartholomew Crews, MD  Melatonin 1 MG TABS Take 1 tablet (1 mg total) by mouth at bedtime. 03/03/18  Yes Bartholomew Crews, MD  metFORMIN (GLUCOPHAGE) 1000 MG tablet Take 1 tablet (1,000 mg total) by mouth 2 (two) times daily with a meal. 03/03/18  Yes Bartholomew Crews, MD  nitroGLYCERIN (NITROSTAT) 0.4 MG SL tablet PLACE 1 TABLET UNDER THE TONGUE EVERY 5 MINUTES AS NEEDED FOR CHEST PAIN Patient taking differently: Place 0.4 mg under the tongue every 5 (five) minutes as needed for chest pain.  06/20/18  Yes Bartholomew Crews, MD  omeprazole (PRILOSEC) 40 MG capsule Take 40 mg by mouth 2 (two) times daily.   Yes [provider]  PARoxetine (PAXIL) 40 MG tablet TAKE 1 TABLET BY MOUTH EVERY MORNING Patient taking differently: Take 40 mg by mouth daily.  05/09/18  Yes Bartholomew Crews, MD  pioglitazone (ACTOS) 15 MG tablet Take 1 tablet (15 mg total) by mouth daily. 03/03/18  Yes Bartholomew Crews, MD  polyethylene glycol [facility]) packet Take 17 g by mouth daily. 04/29/18  Yes Armbruster, Carlota Raspberry, MD  pregabalin (LYRICA) 150 MG capsule Take 1 capsule (150 mg  total) by mouth daily. 05/18/18  Yes Aldine Contes, MD  traMADol (ULTRAM) 50 MG tablet Take 1 tablet (50 mg total) by mouth 2 (two) times daily. 10/25/18  Yes Lilia Argue R, DO  venlafaxine XR (EFFEXOR-XR) 75 MG 24 hr capsule Take 1 capsule (75 mg total) by mouth daily with breakfast. 08/04/18  Yes Bartholomew Crews, MD  vitamin B-12 (CYANOCOBALAMIN) 1000 MCG tablet Take 1,000 mcg by mouth daily.   Yes [provider]  ACCU-CHEK FASTCLIX LANCETS MISC Check blood sugar 3 times a day 05/12/18   Bartholomew Crews, MD  ACCU-CHEK GUIDE test strip CHECK BLODD SUGAR 3 TIMES A DAY 09/13/18   Bartholomew Crews, MD  Blood Glucose Monitoring Suppl (ACCU-CHEK GUIDE) w/Device KIT 1 each by Does not apply route  3 (three) times daily. 08/10/17   Bartholomew Crews, MD  furosemide (LASIX) 40 MG tablet take 1 tablet by mouth once daily Patient not taking: Reported on 10/26/2018 10/04/17   Dorothy Spark, MD  Insulin Pen Needle (BD PEN NEEDLE NANO U/F) 32G X 4 MM MISC USE TO INJECT VICTOZA ONCE A DAY AND LANTUS ONCE A DAY AS DIRECTED 10/07/18   Lucious Groves, DO    Family History Family History  Problem Relation Age of Onset  . Cirrhosis Mother   . Diabetes Father   . Hypertension Father   . Hypertension Sister   . Diabetes Sister   . Hyperlipidemia Sister   . Colon cancer Neg Hx   . Throat cancer Neg Hx     Social History Social History   Tobacco Use  . Smoking status: Passive Smoke Exposure - Never Smoker  . Smokeless tobacco: Never Used  Substance Use Topics  . Alcohol use: No    Alcohol/week: 0.0 standard drinks  . Drug use: No     Allergies   Patient has no known allergies.   Review of Systems Review of Systems  Musculoskeletal:       Left lower leg swelling  All other systems reviewed and are negative.    Physical Exam Updated Vital Signs BP 133/67 Comment: Simultaneous filing. User may not have seen previous data.  Pulse 84 Comment: Simultaneous  filing. User may not have seen previous data.  Temp 98 F (36.7 C) (Oral)   Resp 12 Comment: Simultaneous filing. User may not have seen previous data.  SpO2 98% Comment: Simultaneous filing. User may not have seen previous data.  Physical Exam Vitals signs and nursing note reviewed.  Constitutional:      Appearance: Normal appearance.  HENT:     Head: Normocephalic and atraumatic.     Right Ear: External ear normal.     Left Ear: External ear normal.     Nose: Nose normal.     Mouth/Throat:     Mouth: Mucous membranes are moist.  Eyes:     Extraocular Movements: Extraocular movements intact.     Conjunctiva/sclera: Conjunctivae normal.     Pupils: Pupils are equal, round, and reactive to light.  Neck:     Musculoskeletal: Normal range of motion and neck supple.  Cardiovascular:     Rate and Rhythm: Normal rate and regular rhythm.     Pulses: Normal pulses.     Heart sounds: Normal heart sounds.  Pulmonary:     Effort: Pulmonary effort is normal.     Breath sounds: Normal breath sounds.  Abdominal:     General: Abdomen is flat.     Palpations: Abdomen is soft.  Musculoskeletal:     Left lower leg: Edema present.  Skin:    General: Skin is warm and dry.     Capillary Refill: Capillary refill takes less than 2 seconds.  Neurological:     General: No focal deficit present.     Mental Status: She is alert and oriented to person, place, and time.  Psychiatric:        Mood and Affect: Mood normal.        Behavior: Behavior normal.        Thought Content: Thought content normal.        Judgment: Judgment normal.      ED Treatments / Results  Labs (all labs ordered are listed, but only abnormal results are displayed) Labs Reviewed  BASIC  METABOLIC PANEL - Abnormal; Notable for the following components:      Result Value   Sodium 146 (*)    Glucose, Bld 108 (*)    Calcium 8.7 (*)    All other components within normal limits  CBC WITH DIFFERENTIAL/PLATELET -  Abnormal; Notable for the following components:   RBC 3.61 (*)    Hemoglobin 10.5 (*)    HCT 31.4 (*)    All other components within normal limits  URINALYSIS, ROUTINE W REFLEX MICROSCOPIC - Abnormal; Notable for the following components:   Leukocytes, UA TRACE (*)    All other components within normal limits    EKG None  Radiology Vas [place] Lower Extremity Venous (dvt) (only Mc & Wl 7a-7p)  Result Date: 10/26/2018  Lower Venous Study Indications: Pain.  Performing Technologist: Abram Sander RVS  Examination Guidelines: A complete evaluation includes B-mode imaging, spectral Doppler, color Doppler, and power Doppler as needed of all accessible portions of each vessel. Bilateral testing is considered an integral part of a complete examination. Limited examinations for reoccurring indications may be performed as noted.  Right Venous Findings: +---+---------------+---------+-----------+----------+-------+    CompressibilityPhasicitySpontaneityPropertiesSummary +---+---------------+---------+-----------+----------+-------+ CFVFull           Yes      Yes                          +---+---------------+---------+-----------+----------+-------+  Left Venous Findings: +---------+---------------+---------+-----------+----------+------------------+          CompressibilityPhasicitySpontaneityPropertiesSummary            +---------+---------------+---------+-----------+----------+------------------+ CFV      Full           Yes      Yes                                     +---------+---------------+---------+-----------+----------+------------------+ SFJ      Full                                                            +---------+---------------+---------+-----------+----------+------------------+ FV Prox  Full                                                            +---------+---------------+---------+-----------+----------+------------------+ FV Mid   Full                                                             +---------+---------------+---------+-----------+----------+------------------+ FV DistalFull                                                            +---------+---------------+---------+-----------+----------+------------------+ PFV      Full                                                            +---------+---------------+---------+-----------+----------+------------------+  POP      Full           Yes      Yes                                     +---------+---------------+---------+-----------+----------+------------------+ PTV      Full                                         limited                                                                  visualization      +---------+---------------+---------+-----------+----------+------------------+ PERO                                                  Not visualized     +---------+---------------+---------+-----------+----------+------------------+    Summary: Right: No evidence of common femoral vein obstruction. Left: There is no evidence of deep vein thrombosis in the lower extremity. No cystic structure found in the popliteal fossa.  *See table(s) above for measurements and observations.    Preliminary     Procedures Procedures (including critical care time)  Medications Ordered in ED Medications  morphine 4 MG/ML injection 4 mg (4 mg Intravenous Given 10/26/18 1232)  ondansetron (ZOFRAN) injection 4 mg (4 mg Intravenous Given 10/26/18 1232)     Initial Impression / Assessment and Plan / ED Course  I have reviewed the triage vital signs and the nursing notes.  Pertinent labs & imaging results that were available during my care of the patient were reviewed by me and considered in my medical decision making (see chart for details).    No evidence of DVT.  Labs ok.  Pt is encouraged to wear her compression hose.  Return if worse.  F/u with  pcp.  Final Clinical Impressions(s) / ED Diagnoses   Final diagnoses:  Leg edema    ED Discharge Orders    None       Isla Pence, MD 10/26/18 1335

## 2018-10-31 ENCOUNTER — Other Ambulatory Visit: Payer: Self-pay | Admitting: Internal Medicine

## 2018-11-03 ENCOUNTER — Encounter: Payer: Self-pay | Admitting: Internal Medicine

## 2018-11-03 ENCOUNTER — Ambulatory Visit (INDEPENDENT_AMBULATORY_CARE_PROVIDER_SITE_OTHER): Payer: Medicare Other | Admitting: Internal Medicine

## 2018-11-03 ENCOUNTER — Ambulatory Visit (INDEPENDENT_AMBULATORY_CARE_PROVIDER_SITE_OTHER): Payer: Medicare Other | Admitting: Dietician

## 2018-11-03 ENCOUNTER — Encounter: Payer: Self-pay | Admitting: Dietician

## 2018-11-03 VITALS — BP 157/66 | HR 75 | Temp 97.7°F | Resp 20 | Wt 222.4 lb

## 2018-11-03 DIAGNOSIS — F325 Major depressive disorder, single episode, in full remission: Secondary | ICD-10-CM

## 2018-11-03 DIAGNOSIS — N183 Chronic kidney disease, stage 3 unspecified: Secondary | ICD-10-CM

## 2018-11-03 DIAGNOSIS — E1142 Type 2 diabetes mellitus with diabetic polyneuropathy: Secondary | ICD-10-CM

## 2018-11-03 DIAGNOSIS — R1310 Dysphagia, unspecified: Secondary | ICD-10-CM

## 2018-11-03 DIAGNOSIS — F329 Major depressive disorder, single episode, unspecified: Secondary | ICD-10-CM

## 2018-11-03 DIAGNOSIS — Z1211 Encounter for screening for malignant neoplasm of colon: Secondary | ICD-10-CM

## 2018-11-03 DIAGNOSIS — M25472 Effusion, left ankle: Secondary | ICD-10-CM | POA: Diagnosis not present

## 2018-11-03 DIAGNOSIS — I129 Hypertensive chronic kidney disease with stage 1 through stage 4 chronic kidney disease, or unspecified chronic kidney disease: Secondary | ICD-10-CM | POA: Diagnosis not present

## 2018-11-03 DIAGNOSIS — Z794 Long term (current) use of insulin: Secondary | ICD-10-CM

## 2018-11-03 DIAGNOSIS — IMO0002 Reserved for concepts with insufficient information to code with codable children: Secondary | ICD-10-CM

## 2018-11-03 DIAGNOSIS — E1122 Type 2 diabetes mellitus with diabetic chronic kidney disease: Secondary | ICD-10-CM

## 2018-11-03 DIAGNOSIS — E1165 Type 2 diabetes mellitus with hyperglycemia: Secondary | ICD-10-CM

## 2018-11-03 DIAGNOSIS — Z8719 Personal history of other diseases of the digestive system: Secondary | ICD-10-CM

## 2018-11-03 DIAGNOSIS — R1319 Other dysphagia: Secondary | ICD-10-CM

## 2018-11-03 DIAGNOSIS — E1140 Type 2 diabetes mellitus with diabetic neuropathy, unspecified: Secondary | ICD-10-CM

## 2018-11-03 DIAGNOSIS — E1151 Type 2 diabetes mellitus with diabetic peripheral angiopathy without gangrene: Secondary | ICD-10-CM

## 2018-11-03 DIAGNOSIS — Z0000 Encounter for general adult medical examination without abnormal findings: Secondary | ICD-10-CM

## 2018-11-03 DIAGNOSIS — Z9889 Other specified postprocedural states: Secondary | ICD-10-CM

## 2018-11-03 DIAGNOSIS — I10 Essential (primary) hypertension: Secondary | ICD-10-CM

## 2018-11-03 DIAGNOSIS — Z79899 Other long term (current) drug therapy: Secondary | ICD-10-CM

## 2018-11-03 MED ORDER — BENAZEPRIL HCL 40 MG PO TABS: 40.0000 mg | ORAL_TABLET | Freq: Every day | ORAL | 1 refills | Status: DC

## 2018-11-03 NOTE — Assessment & Plan Note (Signed)
Even though she is 68, she is a community dwelling 73 year old with fairly well-controlled diabetes and hypertension.  Additionally, she has a very compliant and engaged in her care.  I think she would benefit from her 10-year colonoscopy which is due in April of this year.  The patient stated she wants to get it done.  I am referring her back to Dr. Havery Moros to discuss screening colonoscopy.

## 2018-11-03 NOTE — Progress Notes (Signed)
Documentation for Freestyle Libre Pro Continuous glucose monitoring  Freestyle Libre Pro CGM sensor placed today. Patient and her friend [name] were educated about wearing sensor, keeping food, activity and medication log and when to call office. Patient was educated about how to care for the sensor and not to have an MRI, CT or Diathermy while wearing the sensor. Follow up was arranged with the patient for 1 week.   Lot #: [id number] Serial #: [id number] Expiration Date: 03/12/19  Debera Lat, RD 11/03/2018 10:58 AM.

## 2018-11-03 NOTE — Assessment & Plan Note (Signed)
This problem is chronic and stable.  Since her esophageal dilation, she has not had any dysphagia.

## 2018-11-03 NOTE — Assessment & Plan Note (Signed)
This problem is chronic and uncontrolled.  She is on atenolol 100, amlodipine 5, Lasix 40, and benazepril 20.  The benazepril was added at my last appointment in October.  Her blood pressure is not at goal which I am defining is less than 130/80.  She has no systolic dysfunction and no microalbuminuria.  Rather than increasing her amlodipine which could increase her lower extremity edema, I am going to increase her benazepril to 40.  She will return in 1 week although we may not see maximal effect of the medication change by then.  PLAN : Increase benazepril to 40 RTC 1 week  BP Readings from Last 3 Encounters:  11/03/18 (!) 157/66  10/26/18 (!) 127/56  10/25/18 (!) 137/54

## 2018-11-03 NOTE — Assessment & Plan Note (Signed)
This problem is chronic and uncontrolled.  At her October appointment I tapered her Paxil to off and started venlafaxine since it can also help neuropathy symptoms.  She has made this change but it has not helped her neuropathy symptoms.  She is also on Lyrica 150 daily.  She has been on 100 TID but did not find the higher dose better than the once a day dosing.  Her diabetes is under fairly good control.  Her pattern of neuropathy does fit diabetic neuropathy.  She did have vitamin B12 deficiency in 2016 but she was repleted and her level was over 2000 in 2018.  PLAN:  Cont current meds

## 2018-11-03 NOTE — Patient Instructions (Addendum)
Please record the time, amount and what food drinks and activities you have while wearing the continuous glucose monitor (CGM).  Bring the folder with you to follow up appointments. If your monitor falls off, please place it in the bag provided in your folder and bring it back with you to your next appointment.   Do not have a CT or an MRI while wearing the CGM.   1 week visit has been set up with me and a doctor for the first of two CGM downloads.   You will also return in 2 weeks to have your second download and the CGM removed.  Debera Lat, RD 11/03/2018 11:06 AM

## 2018-11-03 NOTE — Assessment & Plan Note (Signed)
This problem is chronic and stable.  She is now off Paxil and on venlafaxine.  The venlafaxine has not improved her diabetic neuropathy but it is managing her depression.  She has no side effects to this medication.  PLAN:  Cont current meds

## 2018-11-03 NOTE — Progress Notes (Signed)
   Subjective:    Patient ID: [name] [name], female    DOB: 11-Jul-1946, 73 y.o.   MRN: [id number]  HPI  KAYDENSE RIZO is here for HTN F/U. Please see the A&P for the status of the pt's chronic medical problems.  ROS : per ROS section and in problem oriented charting. All other systems are negative.  PMHx, Soc hx, and / or Fam hx : At her last sports medicine appointment, she complained of left ankle pain and swelling and was sent to the ED to rule out DVT.  Her Doppler was negative for DVT.  Review of Systems  Respiratory: Negative for cough.   Gastrointestinal:       No dysphasia  Musculoskeletal: Positive for arthralgias.       Left ankle swelling  Skin:       Intermittent skinfold Candida infections  Neurological:       Positive neuropathy bilaterally and feet  Psychiatric/Behavioral: Negative for dysphoric mood.       Objective:   Physical Exam Constitutional:      General: She is not in acute distress.    Appearance: Normal appearance. She is obese. She is not ill-appearing or diaphoretic.  HENT:     Head: Normocephalic and atraumatic.     Right Ear: External ear normal.     Left Ear: External ear normal.     Nose: Nose normal. No congestion or rhinorrhea.  Eyes:     General: No scleral icterus.       Right eye: No discharge.        Left eye: No discharge.     Conjunctiva/sclera: Conjunctivae normal.  Musculoskeletal:     Comments: There is +1 pitting edema pretibially bilaterally. There is nonpitting edema around the left ankle with tenderness posteriorly and medially.  There is there is no point tenderness over the malleoli.  She has free range of motion, no pain to internal or external rotation, and flexion extension.  There is minimal erythema medially.  Skin:    General: Skin is warm and dry.  Neurological:     General: No focal deficit present.     Mental Status: She is alert. Mental status is at baseline.  Psychiatric:        Mood and Affect: Mood  normal.        Behavior: Behavior normal.        Thought Content: Thought content normal.        Judgment: Judgment normal.           Assessment & Plan:

## 2018-11-03 NOTE — Patient Instructions (Signed)
1. Please return in 7 days with [place] for Download #1.  2. See Dr Lynnae January in 3 months 3.  Your ankle pain may have been a gout attack.  You can take ibuprofen with food for 2 or 3 days to help the pain.  If you get similar pain in the future, please let me know.

## 2018-11-03 NOTE — Assessment & Plan Note (Signed)
This is a new problem for internal medicine.  She saw her sports medicine doctor on January 14 to follow-up her shoulder pain.  She mentioned her left ankle was swollen and she was referred to the ED to rule out DVT.  She went to the ED on the subsequent day had a negative Doppler.  Today, she states she went to bed on Thursday the 9th with no ankle issues.  She woke up on Friday the 10th and had swelling around her left ankle.  On the 11th she woke up and it was red with her daughter stating it was as red as a tomatoe.  She then woke up on the 12th and the pain was so severe she was unable to walk.  She then went to to see her sports medicine doctor on the 14th.  Today, she states that is 50% better and is able to walk on it.  The erythema has mostly resolved.  The swelling is still there.  She has never had pain like this before.  I am wondering if this is an acute gout flare as even though it occurred over 30 days, each exacerbation was overnight.  I encouraged her to take ibuprofen for 2 to 4 days with food.  We discussed that she should not be on this medication long-term.  I encouraged her to call me if she gets another flare not just in that ankle but elsewhere.  I do not think the swelling around the ankle is due to her pioglitazone or amlodipine as it is too localized and is non-pitting.  PLAN : to call me if recurs Consider gout and arthrocentesis if it reoccurs Ibuprofen for 2 to 4 days to help resolve the residual swelling and pain

## 2018-11-03 NOTE — Assessment & Plan Note (Signed)
Her kidney function has completely normalized and her GFR is greater than 60.  I am removing this diagnosis from her problem list.

## 2018-11-03 NOTE — Assessment & Plan Note (Signed)
This problem is chronic and stable.  Her A1c trend is 8.5 - 8.1 - 7.4.  She is on Lantus 10 units, metformin 1000 BID, Victoza 1.2 QD, and pioglitazone 15.  She checks her CBG 3 times a day but forgot her log.  My goal is to get her off Lantus to decrease the weight gain and hypoglycemic risk.  She has had a CGM placed today and will return in 1 week to get this downloaded.  Options to get her off Lantus would be to increase her pioglitazone, increase her Victoza to 1.8, or add an SGLT2.  The medication change will depend on her CGM download.  PLAN : return 1 week for CGM download and A1c

## 2018-11-05 ENCOUNTER — Encounter: Payer: Self-pay | Admitting: Podiatry

## 2018-11-05 NOTE — Progress Notes (Signed)
Subjective: Dayna Ramus presents today with history of neuropathy with cc of painful, mycotic toenails.  Pain is aggravated when wearing enclosed shoe gear and relieved with periodic professional debridement.  She voices no new problems on today's visit.  Patient has peripheral neuropathy managed with Lyrica.  Bartholomew Crews, MD is her PCP.  No Known Allergies  Objective:  Vascular Examination: Capillary refill time immediate x 10 digits Dorsalis pedis diminished bilaterally Posterior tibial pulses absent bilaterally Digital hair x 10 digits was absent Skin temperature gradient WNL b/l  Dermatological Examination: Skin with normal turgor, texture and tone b/l  Toenails 1-5 b/l discolored, thick, dystrophic with subungual debris and pain with palpation to nailbeds due to thickness of nails.  Hyperkeratotic lesions noted submetatarsal head 5 bilaterally and submetatarsal head 1 bilaterally.  There is no edema, no erythema, no drainage noted bilaterally.  Musculoskeletal: Muscle strength 5/5 to all muscle groups b/l  Neurological: Sensation with 10 gram monofilament is absent b/l Vibratory sensation absent b/l  Assessment: 1. Painful onychomycosis toenails 1-5 b/l 2. Calluses noted submetatarsal head 5 bilaterally, submetatarsal head 1 bilaterally 3. NIDDM with PAD 4. Diabetic neuropathy  Plan: 1. Continue diabetic foot care principles and daily foot inspection. 2. Toenails 1-5 b/l were debrided in length and girth without iatrogenic bleeding. 3. Calluses pared submetatarsal head 5 bilaterally and submetatarsal head 1 bilaterally 4. Patient to continue soft, supportive shoe gear 5. Patient to report any pedal injuries to medical professional  6. Follow up 3 months. Patient/POA to call should there be a concern in the interim.

## 2018-11-07 ENCOUNTER — Other Ambulatory Visit: Payer: Self-pay | Admitting: Internal Medicine

## 2018-11-07 NOTE — Telephone Encounter (Signed)
Does pt have enough to get by until thur? We plan on stopping lantus when we start other meds.

## 2018-11-08 NOTE — Telephone Encounter (Signed)
One yr supply sent to this pharmacy in Oct 2019

## 2018-11-10 ENCOUNTER — Encounter: Payer: Self-pay | Admitting: Licensed Clinical Social Worker

## 2018-11-10 ENCOUNTER — Ambulatory Visit: Payer: Medicare Other | Admitting: Licensed Clinical Social Worker

## 2018-11-10 ENCOUNTER — Encounter: Payer: Self-pay | Admitting: Internal Medicine

## 2018-11-10 ENCOUNTER — Encounter: Payer: Self-pay | Admitting: Dietician

## 2018-11-10 ENCOUNTER — Ambulatory Visit (INDEPENDENT_AMBULATORY_CARE_PROVIDER_SITE_OTHER): Payer: Medicare Other | Admitting: Internal Medicine

## 2018-11-10 ENCOUNTER — Ambulatory Visit (INDEPENDENT_AMBULATORY_CARE_PROVIDER_SITE_OTHER): Payer: Medicare Other | Admitting: Dietician

## 2018-11-10 ENCOUNTER — Other Ambulatory Visit: Payer: Self-pay

## 2018-11-10 VITALS — BP 139/54 | HR 71 | Temp 97.6°F | Wt 220.1 lb

## 2018-11-10 DIAGNOSIS — G4700 Insomnia, unspecified: Secondary | ICD-10-CM | POA: Diagnosis not present

## 2018-11-10 DIAGNOSIS — E118 Type 2 diabetes mellitus with unspecified complications: Secondary | ICD-10-CM

## 2018-11-10 DIAGNOSIS — Z6838 Body mass index (BMI) 38.0-38.9, adult: Secondary | ICD-10-CM

## 2018-11-10 DIAGNOSIS — F329 Major depressive disorder, single episode, unspecified: Secondary | ICD-10-CM | POA: Diagnosis not present

## 2018-11-10 DIAGNOSIS — E1151 Type 2 diabetes mellitus with diabetic peripheral angiopathy without gangrene: Secondary | ICD-10-CM

## 2018-11-10 DIAGNOSIS — IMO0002 Reserved for concepts with insufficient information to code with codable children: Secondary | ICD-10-CM

## 2018-11-10 DIAGNOSIS — F320 Major depressive disorder, single episode, mild: Secondary | ICD-10-CM

## 2018-11-10 DIAGNOSIS — Z794 Long term (current) use of insulin: Secondary | ICD-10-CM

## 2018-11-10 DIAGNOSIS — Z713 Dietary counseling and surveillance: Secondary | ICD-10-CM

## 2018-11-10 DIAGNOSIS — E1165 Type 2 diabetes mellitus with hyperglycemia: Secondary | ICD-10-CM

## 2018-11-10 MED ORDER — INSULIN GLARGINE 100 UNIT/ML SOLOSTAR PEN: 12.0000 [IU] | PEN_INJECTOR | Freq: Every day | SUBCUTANEOUS | 2 refills | Status: DC

## 2018-11-10 NOTE — Assessment & Plan Note (Signed)
Depression with PHQ-9: Based on the patients score of 15 we have decided to address the primary underlying issue of insomnia and hopelessness. She stated that she feels there is little to live for. She stated that she has difficulty falling asleep each night but denied use of caffeine after 3pm, denied use of EtOH, stated that she uses her bed for sleep only, avoid watching television or reading in bed.  Referral to [facility] placed to further discuss sleep hygiene and hopelessness. Additionally, she has been advised to take the melatonin 1-2 hours before bed each night for at least two weeks as this medication has only shown effectiveness when taken for at least 1-2 weeks in similar patient populations when combined with appropriate sleep hygiene.

## 2018-11-10 NOTE — Progress Notes (Signed)
Medical Nutrition Therapy:  Appt start time: 0930 end time:  1000. Visit # 1  Assessment:  Primary concerns today: glycemic control.   Ms. Scibilia wore the cgm sensor for 7 days. Per the CGM report, her blood sugars are very well controlled and mostly at goal for her with a dip overnight that showed her CBGs overnight probably < 165m/dl.Her time below target was 5% and the suggested goal is <3%.  This was discussed with Dr. HBerline Lopes She had only two values >1846mdl. We discussed possible reasons and solutions. She agreed to try to eat something before she leaves for church this Sunday to see if this helps her eat a smaller portion at lunch  Preferred Learning Style: No preference indicated  Learning Readiness: Contemplating  ANTHROPOMETRICS: Estimated body mass index is 38.99 kg/m as calculated from the following:   Height as of 10/25/18: '5\' 3"'  (1.6 m).   Weight as of an earlier encounter on 11/10/18: 220 lb 1.6 oz (99.8 kg).  WEIGHT HISTORY: Wt Readings from Last 5 Encounters:  11/10/18 220 lb 1.6 oz (99.8 kg)  11/03/18 222 lb 6.4 oz (100.9 kg)  10/25/18 240 lb (108.9 kg)  08/04/18 227 lb (103 kg)  06/02/18 219 lb (99.3 kg)    SLEEP:restless- has trouble falling and staying asleep  MEDICATIONS: 12 units lantus at bedtime Current Outpatient Medications on File Prior to Visit  Medication Sig Dispense Refill  . ACCU-CHEK FASTCLIX LANCETS MISC Check blood sugar 3 times a day 102 each 11  . ACCU-CHEK GUIDE test strip CHECK BLODD SUGAR 3 TIMES A DAY 100 each 5  . amLODipine (NORVASC) 5 MG tablet Take 1 tablet (5 mg total) by mouth daily. 90 tablet 3  . aspirin 81 MG tablet Take 1 tablet (81 mg total) by mouth daily. 90 tablet 3  . atenolol (TENORMIN) 100 MG tablet Take 1 tablet (100 mg total) by mouth daily. Please make yearly appt with Dr. NeMeda Coffeeor February before anymore refills. 1st attempt 90 tablet 0  . atorvastatin (LIPITOR) 10 MG tablet TAKE 1 TABLET BY MOUTH DAILY (Patient  taking differently: Take 10 mg by mouth daily. ) 90 tablet 3  . benazepril (LOTENSIN) 40 MG tablet Take 1 tablet (40 mg total) by mouth daily. 90 tablet 1  . Blood Glucose Monitoring Suppl (ACCU-CHEK GUIDE) w/Device KIT 1 each by Does not apply route 3 (three) times daily. 1 kit 0  . furosemide (LASIX) 40 MG tablet take 1 tablet by mouth once daily (Patient not taking: Reported on 10/26/2018) 90 tablet 3  . Insulin Pen Needle (BD PEN NEEDLE NANO U/F) 32G X 4 MM MISC USE TO INJECT VICTOZA ONCE A DAY AND LANTUS ONCE A DAY AS DIRECTED 100 each 5  . isosorbide mononitrate (IMDUR) 30 MG 24 hr tablet TAKE 1 TABLET BY MOUTH ONCE DAILY (Patient taking differently: Take 30 mg by mouth daily. ) 90 tablet 3  . liraglutide (VICTOZA) 18 MG/3ML SOPN ADMINISTER 1.2 MG UNDER THE SKIN DAILY 18 mL 5  . loratadine (CLARITIN) 10 MG tablet Take 1 tablet (10 mg total) by mouth daily. 90 tablet 3  . Melatonin 1 MG TABS Take 1 tablet (1 mg total) by mouth at bedtime. 90 tablet 3  . metFORMIN (GLUCOPHAGE) 1000 MG tablet Take 1 tablet (1,000 mg total) by mouth 2 (two) times daily with a meal. 180 tablet 3  . nitroGLYCERIN (NITROSTAT) 0.4 MG SL tablet PLACE 1 TABLET UNDER THE TONGUE EVERY 5 MINUTES AS NEEDED FOR  CHEST PAIN (Patient taking differently: Place 0.4 mg under the tongue every 5 (five) minutes as needed for chest pain. ) 25 tablet 0  . omeprazole (PRILOSEC) 40 MG capsule Take 40 mg by mouth 2 (two) times daily.    [name] PARoxetine (PAXIL) 40 MG tablet TAKE 1 TABLET BY MOUTH EVERY MORNING (Patient taking differently: Take 40 mg by mouth daily. ) 90 tablet 3  . pioglitazone (ACTOS) 15 MG tablet Take 1 tablet (15 mg total) by mouth daily. 90 tablet 3  . polyethylene glycol (MIRALAX) packet Take 17 g by mouth daily. 14 each 0  . pregabalin (LYRICA) 150 MG capsule Take 1 capsule (150 mg total) by mouth daily. 90 capsule 1  . traMADol (ULTRAM) 50 MG tablet Take 1 tablet (50 mg total) by mouth 2 (two) times daily. 60 tablet 0   . venlafaxine XR (EFFEXOR-XR) 75 MG 24 hr capsule Take 1 capsule (75 mg total) by mouth daily with breakfast. 90 capsule 1  . vitamin B-12 (CYANOCOBALAMIN) 1000 MCG tablet Take 1,000 mcg by mouth daily.     No current facility-administered medications on file prior to visit.    BLOOD SUGAR: Lab Results  Component Value Date   HGBA1C 7.4 (A) 08/04/2018   HGBA1C 8.1 (A) 03/03/2018   HGBA1C 8.5 11/18/2017   HGBA1C 6.6 06/17/2017   HGBA1C 6.7 (H) 02/24/2017    CGM Results from Download #1 Average is  120  for 7 days Time in range (70-180 mg/dL): 90% (Goal >70%) Time above range (>180 mg/dL) 5% Time below range (<70 mg/dL): 5% (Goal is <3%) Coefficient of variation: 28% (Goal is <36%) Standard variation:34.3 mg/dL (Goal is <50 mg/dL)  DIETARY INTAKE: Usual eating pattern includes 2-3 meals and 2-3 snacks per day. 24-hr recall:  B ( AM): grits and eggs and coffee or egg, toast with butter and coffee  L ( PM): baked chix, baked potato, greens, water or diet soda Snk ( PM):graham cracker or fruit, diet sunkist D ( PM): small baked chicken breast,mashed potatoes, green beans,  Snk ( PM): fruit Beverages: coffee with splenda & powdered creamer, water, diet sunkist  Usual physical activity: in a wheelchair today, limited to indoors  Progress Towards Goal(s):  In progress.   Nutritional Diagnosis:  NI-5.8.4 Inconsistent carbohydrate intake As related to her skipping breakfast one day a week.  As evidenced by the blood sugar rise after lunch of sunday after skipping breakfast and her reported intake.    Intervention:  Nutrition education about the need to spread her carb intake out throughout the day.Also suggested non caffeine containing sodas(sunkist has caffeine)  Action Goal:eat something before going to church on Sunday this week  Outcome goal: improved blood sugars Coordination of care: discussed plan  with Dr. Berline Lopes  Teaching Method Utilized: Visual, Auditory,Hands  on Handouts given during visit include: After visit summary Barriers to learning/adherence to lifestyle change: competing values, high PHQ-9 Demonstrated degree of understanding via:  Teach Back   Monitoring/Evaluation:  Dietary intake, exercise, meter, and body weight in 1 week(s). 20 minutes was spent on education and 10 minutes was spent downloading her CGM.   Debera Lat, RD 11/10/2018 2:54 PM.

## 2018-11-10 NOTE — Progress Notes (Signed)
Medicine attending: Medical history, presenting problems, physical findings, and medications, reviewed with resident physician Dr Lawrence Harbrecht on the day of the patient visit and I concur with his evaluation and management plan. 

## 2018-11-10 NOTE — Patient Instructions (Signed)
Rosa,  Thank you for wearing the button. It shows your blood sugars are mostly well controlled.  The plan to help you eat smaller portion son Sunday afternoon when you go to church is  Eat breakfast before you go to church so you won't be so hungry at lunch. You can eat anything such as leftovers or half a peanut butter and banana sandwich "A little something is better than nothing!"  See you next week!  Butch Penny [phone]

## 2018-11-10 NOTE — Progress Notes (Signed)
   CC: Diabetes management   HPI:[name] is a 73 y.o. female who presents for evaluation of DMII. Please see individual problem based A/P for details.   Past Medical History:  Diagnosis Date  . Anemia   . Arthritis   . CAD 08/26/2006   Cath 07/05:multiple areas of nonobstructive disease = medical & RF mgmt, well preserved global systolic function. Dr. Lia Foyer. Nuclear med stress test 01/2014 : Normal stress nuclear study. LV Ejection Fraction: 76%. LV Wall Motion: NL LV Function; NL Wall Motion.      . Chronic pain syndrome 01/03/2013   Multifactorial. Non opioid requiring.   L2-5 fusion, with hardware at L2-3, stable per MRI 2010. Followed by neurosurgery (Dr. 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    [name] DEPRESSION 08/26/2006   On paxil    . DIABETES MELLITUS, TYPE II 08/26/2006   Insulin dependent. Victoza added 03/2014  . DYSLIPIDEMIA 11/01/2006   LDL goal < 100, 70 if can achieve without side effects.     . Gallstones   . GERD 08/26/2006   Heartburn QHS.     [name] HYPERTENSION 08/26/2006   On BB, ACEI, lasix, norvasc, metolazone, and imdur.     . OBESITY 08/26/2006   BMI 39.5. Obesity Class 2.     . PVD (peripheral vascular disease) ([place]) 03/27/2014   2015 LE arterial Duplex - Possible mild inflow disease on the left. 0-49% bilateral SFA disease, without focal stenosis. Three vesel run-off, bilaterally. Medical tx.    Review of Systems:  ROS negative except as per HPI.  Physical Exam: Vitals:   11/10/18 1013  BP: (!) 139/54  Pulse: 71  Temp: 97.6 F (36.4 C)  TempSrc: Oral  SpO2: 97%  Weight: 220 lb 1.6 oz (99.8 kg)   General: A/O x4, in no acute distress, afebrile, nondiaphoretic Cardio: RRR, no mrg's Pulmonary: CTA bilaterally MSK: BLE nontender, nonedematous  Assessment & Plan:   See Encounters Tab for problem based charting.  Patient discussed with Dr. Beryle Beams

## 2018-11-10 NOTE — Assessment & Plan Note (Signed)
DMII: [name] [name] wore the CGM for 7 days. The average reading was 120, % time in target was 90%, time below target was 5%, with time above target being 5%. Intervention will be to stop the 12U of PM Lantus and initiate AM 12U lantus. The patient will be scheduled to see Butch Penny and [facility] for a final appointment.

## 2018-11-10 NOTE — Patient Instructions (Signed)
Please return in 7 days with [place] for Download #2.   We also discussed your depression or hopelessness today. I will have someone call you to schedule an appointment to meet with you regarding the hopelessness.   For your sleeping difficulties, please take the melatonin 5mg  every evening 1-2 hours before bed.

## 2018-11-10 NOTE — BH Specialist Note (Signed)
Integrated Behavioral Health Initial Visit  MRN: [id number] Name: [name] [name]  Number of [place] Clinician visits:: 1/6 Session Start time: 11:00  Session End time: 11:20 Total time: 20 minutes  Type of Service: [place] Interpretor:No.    Warm Hand Off Completed. Yes, by Dr. Berline Lopes.       SUBJECTIVE: [name] [name] is a 73 y.o. female  whom attended the session individually.  Patient was referred by Dr. Berline Lopes for an elevated PHQ-9 screen. Patient reports the following symptoms/concerns: recent onset of depression and mild anxiety symptoms.  Duration of problem: began around 1-2 months ago; Severity of problem: mild  OBJECTIVE: Mood: Netural and Affect: Appropriate Risk of harm to self or others: No plan to harm self or others  LIFE CONTEXT: Family and Social: Patient reported that she lives with a friend. Patient reported support from her daughter. Patient attends church, and identified this as her most consistent form of socialization with others.  Self-Care: Patient does not drive herself, and relies on others to leave her home. Patient reported cooking and watching television as her main coping skill. Patient has concerns around her health, especially managing her diabetes. Patient reported difficulty sleeping.  Life Changes: Recent increased depressive episodes.   GOALS ADDRESSED: Patient will: 1. Reduce symptoms of: anxiety, depression and insomnia 2. Increase knowledge and/or ability of: coping skills, healthy habits and stress reduction  3. Demonstrate ability to: Increase healthy adjustment to current life circumstances and Increase adequate support systems for patient/family  INTERVENTIONS: Interventions utilized: Motivational Interviewing, Supportive Counseling and Sleep Hygiene  Standardized Assessments completed: PHQ 9  ASSESSMENT: Patient currently experiencing recent increase in depressive  symptoms. Patient reported feeling fatigue, thoughts of letting others down, and periods of sadness. Patient denies any recent events that could be causing the increased sadness. Patient denies any SI, HI, or self-harm. Patient reported she has limited amounts of friends due to many of them already passing. Patient does have her daughter, and they have a positive relationship.  Patient reported having issues with falling asleep. Patient has been taking her prescribed medication to assist her in falling asleep, but reported that she continues to struggle to fall asleep in a timely manner. Patient agreed to engage in relaxation activities before bedtime to increase her ability to fall asleep.   Patient has mild anxious thoughts related to her health issues. Patient identified healthy coping skills she can engage in when feeling anxious or overwhelmed.    Patient may benefit from bi-weekly outpatient therapy.  PLAN: 1. Follow up with behavioral health clinician on : two weeks.  2. Referral(s): [place] (In Clinic)   [place], [place], [place]

## 2018-11-11 NOTE — Telephone Encounter (Signed)
Pt was seen in [facility] on 11/10/18. No further action needed, phone call complete.[name]1/31/20202:59 PM

## 2018-11-15 ENCOUNTER — Encounter: Payer: Self-pay | Admitting: Cardiology

## 2018-11-17 ENCOUNTER — Ambulatory Visit (INDEPENDENT_AMBULATORY_CARE_PROVIDER_SITE_OTHER): Payer: Medicare Other | Admitting: Internal Medicine

## 2018-11-17 ENCOUNTER — Encounter: Payer: Self-pay | Admitting: Internal Medicine

## 2018-11-17 ENCOUNTER — Encounter: Payer: Self-pay | Admitting: Dietician

## 2018-11-17 ENCOUNTER — Ambulatory Visit (INDEPENDENT_AMBULATORY_CARE_PROVIDER_SITE_OTHER): Payer: Medicare Other | Admitting: Dietician

## 2018-11-17 VITALS — BP 116/66 | HR 89

## 2018-11-17 DIAGNOSIS — E1165 Type 2 diabetes mellitus with hyperglycemia: Secondary | ICD-10-CM | POA: Diagnosis not present

## 2018-11-17 DIAGNOSIS — Z713 Dietary counseling and surveillance: Secondary | ICD-10-CM

## 2018-11-17 DIAGNOSIS — E1151 Type 2 diabetes mellitus with diabetic peripheral angiopathy without gangrene: Secondary | ICD-10-CM

## 2018-11-17 DIAGNOSIS — IMO0002 Reserved for concepts with insufficient information to code with codable children: Secondary | ICD-10-CM

## 2018-11-17 LAB — GLUCOSE, CAPILLARY: Glucose-Capillary: 93 mg/dL (ref 70–99)

## 2018-11-17 NOTE — Patient Instructions (Addendum)
Rosa- you did great!  You ate something before going to church AND you switched to a diet soda without caffeine.   Talk to the doctor about your sleep.   You can stay up until 9 or 10.  Do something quiet like a picture find book.  This may help you get ready to sleep.   Also, talk to the doctor about taking walks or other activity.   Follow up in 6-8 months.   Butch Penny [phone]

## 2018-11-17 NOTE — Progress Notes (Signed)
   CC: Follow-up of diabetes  HPI:  [name] is a 73 y.o. female with type 2 diabetes who presents for CGM download #2.  She is currently taking Lantus 12 units daily with breakfast (switched from nighttime 1 week ago), Victoza 1.2 mg QD, and pioglitazone 15 mg daily. A1c 07/2018 7.4.   [name] [name] wore the CGM for 14 days. The average reading was 113, % time in target was 87, % time below target was 10, and % time above target was 3.  She states that she begins to feel bad (shakiness and jitteriness) when her blood sugar is low.  She has no other acute complaints.   Past Medical History:  Diagnosis Date  . Anemia   . Arthritis   . CAD 08/26/2006   Cath 07/05:multiple areas of nonobstructive disease = medical & RF mgmt, well preserved global systolic function. Dr. Lia Foyer. Nuclear med stress test 01/2014 : Normal stress nuclear study. LV Ejection Fraction: 76%. LV Wall Motion: NL LV Function; NL Wall Motion.      . Chronic pain syndrome 01/03/2013   Multifactorial. Non opioid requiring.   L2-5 fusion, with hardware at L2-3, stable per MRI 2010. Followed by neurosurgery (Dr. 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    [name] DEPRESSION 08/26/2006   On paxil    . DIABETES MELLITUS, TYPE II 08/26/2006   Insulin dependent. Victoza added 03/2014  . DYSLIPIDEMIA 11/01/2006   LDL goal < 100, 70 if can achieve without side effects.     . Gallstones   . GERD 08/26/2006   Heartburn QHS.     [name] HYPERTENSION 08/26/2006   On BB, ACEI, lasix, norvasc, metolazone, and imdur.     . OBESITY 08/26/2006   BMI 39.5. Obesity Class 2.     . PVD (peripheral vascular disease) ([place]) 03/27/2014   2015 LE arterial Duplex - Possible mild inflow disease on the left. 0-49% bilateral SFA disease, without focal stenosis. Three vesel run-off, bilaterally. Medical tx.    Review of Systems:   Review of Systems  Respiratory: Negative for cough  and shortness of breath.   Cardiovascular: Negative for chest pain and palpitations.  Gastrointestinal: Negative for abdominal pain, constipation, diarrhea and vomiting.  All other systems reviewed and are negative.   Physical Exam:  Vitals:   11/17/18 1410 11/17/18 1416  BP: (!) 97/56 116/66  Pulse: 91 89   Physical Exam Vitals signs reviewed.  Constitutional:      Appearance: Normal appearance.  HENT:     Head: Normocephalic and atraumatic.  Cardiovascular:     Rate and Rhythm: Normal rate and regular rhythm.     Pulses: Normal pulses.  Pulmonary:     Effort: Pulmonary effort is normal. No respiratory distress.     Breath sounds: Normal breath sounds.  Neurological:     Mental Status: She is alert. Mental status is at baseline.  Psychiatric:        Mood and Affect: Mood normal.        Behavior: Behavior normal.     Assessment & Plan:   See Encounters Tab for problem based charting.  Patient discussed with Dr. Dareen Piano

## 2018-11-17 NOTE — Assessment & Plan Note (Addendum)
Assessment: She is currently taking Lantus 12 units daily with breakfast (switched from nighttime because of morning hypoglycemia), Victoza 1.2 mg QD, and pioglitazone 15 mg daily. A1c 1-/2019 7.4.   Rosa L Gasca wore the CGM for 14 days. The average reading was 113, % time in target was 87, % time below target was 10, and % time above target was 3. Intervention will be to decrease Lantus to 8 units every morning. I have asked Ms. Jain to check her blood sugar every morning.  If she continues to have hypoglycemic events she is instructed to call the clinic.  If her blood sugar remains within goal we will plan on seeing her for a routine follow-up visit in 3 months.  Plan: 1. Decrease lantus to 8 units every morning 2. Continue Victoza 1.2 mg QD 3. Continue pioglitazone 15 mg QD

## 2018-11-17 NOTE — Progress Notes (Signed)
Internal Medicine Clinic Attending  Case discussed with Dr. Alfonse Spruce at the time of the visit.  We reviewed the resident's history and exam and pertinent patient test results.  I agree with the assessment, diagnosis, and plan of care documented in the resident's note.  I personally reviewed the CGM data, the resident's interpretation, and agree with the intervention.

## 2018-11-17 NOTE — Progress Notes (Signed)
  Medical Nutrition Therapy:  Appt start time: 4695 end time:  0722 Visit # 2  Assessment:  Primary concerns today: glycemic control.   [name] [name] wore the cgm sensor for 14 days. Per the CGM report, her blood sugars are very well controlled. They are  improved this week compared to last including lower overnight values. CBGs overnight are the lowest showing time below target was 10% this week compared to 5% last week and the suggested goal is <3-4%.  This was discussed with Dr.Prince. She ate something before church and said she plan to do this from now on.  She'd  like to be more active, but is not sure how she can do this.  SLEEP:continues to report trouble  MEDICATIONS: 12 units lantus and "12" units victoza at breakfast time, she was not sure if she takes pioglitazone, but thinks it may be one of 12 pills she takes at breakfast, metforminbedtime CGM Results from [facility]2 Average is 113 ( last week was 120) for 14 days Time in range (70-180 mg/dL): 87% (Goal >70%) Time above range (>180 mg/dL) 3% Time below range (<70 mg/dL): 10% (Goal is <4%) Coefficient of variation: 29.7% (Goal is <36%) Standard variation:33.6 mg/dL (Goal is <50 mg/dL)  DIETARY INTAKE: Usual eating pattern includes 2-3 meals and 2-3 snacks per day.Food record brought in showed ~ 2-3 vegetables/week, no fruit, considerable saturated fat choices and processed grains. However, this may not be an accurate record of what she ate because her daughter who does not live with her assisted with the recording of her food.  Usual physical activity: in a wheelchair today, limited to indoors  Progress Towards Goal(s):  Some progress.   Nutritional Diagnosis:  NI-5.8.4 Inconsistent carbohydrate intake As related to her skipping breakfast one day a week is much improved/resolved  As evidenced by the blood sugar in target the entire day( most fo the week)  with several rises for meals and her food journal.    Intervention:   Nutrition education about how her behavior changes made a difference in her blood sugars, nutrition status and overall health > she met both her action and her outcome goals Coordination of care: discussed plan  with Dr.Prince  Teaching Method Utilized: Visual, Auditory,Hands on Handouts given during visit include: After visit summary Barriers to learning/adherence to lifestyle change: competing values Demonstrated degree of understanding via:  Teach Back   Monitoring/Evaluation:  Dietary intake, exercise, meter, and body weight in 7 month(s).[name] [name], RD 11/17/2018 2:40 PM.

## 2018-11-17 NOTE — Patient Instructions (Signed)
Please decrease morning Lantus to 8 units.

## 2018-11-28 ENCOUNTER — Telehealth: Payer: Self-pay | Admitting: Dietician

## 2018-11-28 NOTE — Telephone Encounter (Signed)
Gave [name] [name] directions over the phone on how to reset her meter. She says this process did not work. She will try to come in today between 103- and 11 am.

## 2018-11-29 ENCOUNTER — Ambulatory Visit: Payer: Medicare Other | Admitting: Dietician

## 2018-11-29 ENCOUNTER — Encounter: Payer: Self-pay | Admitting: Dietician

## 2018-11-29 NOTE — Progress Notes (Signed)
Assisted patient with her meter. It seemed to be working okay at today's visit. She checked her blood sugar to demonstrate that it was working. She has a back up meter at home that is not working. She plans to bring this to her next visit.

## 2018-12-06 ENCOUNTER — Encounter: Payer: Self-pay | Admitting: Cardiology

## 2018-12-06 ENCOUNTER — Ambulatory Visit (INDEPENDENT_AMBULATORY_CARE_PROVIDER_SITE_OTHER): Payer: Medicare Other | Admitting: Cardiology

## 2018-12-06 VITALS — BP 134/66 | HR 78 | Ht 63.0 in | Wt 215.8 lb

## 2018-12-06 DIAGNOSIS — I2510 Atherosclerotic heart disease of native coronary artery without angina pectoris: Secondary | ICD-10-CM | POA: Diagnosis not present

## 2018-12-06 DIAGNOSIS — E785 Hyperlipidemia, unspecified: Secondary | ICD-10-CM

## 2018-12-06 DIAGNOSIS — I10 Essential (primary) hypertension: Secondary | ICD-10-CM | POA: Diagnosis not present

## 2018-12-06 LAB — HEPATIC FUNCTION PANEL
ALT: 10 IU/L (ref 0–32)
AST: 13 IU/L (ref 0–40)
Albumin: 3.9 g/dL (ref 3.7–4.7)
Alkaline Phosphatase: 91 IU/L (ref 39–117)
Bilirubin Total: 0.5 mg/dL (ref 0.0–1.2)
Bilirubin, Direct: 0.16 mg/dL (ref 0.00–0.40)
Total Protein: 7.2 g/dL (ref 6.0–8.5)

## 2018-12-06 LAB — LIPID PANEL
Chol/HDL Ratio: 2.2 ratio (ref 0.0–4.4)
Cholesterol, Total: 111 mg/dL (ref 100–199)
HDL: 50 mg/dL (ref >39)
LDL Calculated: 47 mg/dL (ref 0–99)
Triglycerides: 68 mg/dL (ref 0–149)
VLDL Cholesterol Cal: 14 mg/dL (ref 5–40)

## 2018-12-06 NOTE — Patient Instructions (Signed)
Medication Instructions:  NONE If you need a refill on your cardiac medications before your next appointment, please call your pharmacy.   Lab work:TODAY FLP HFT If you have labs (blood work) drawn today and your tests are completely normal, you will receive your results only by: [name] MyChart Message (if you have MyChart) OR . A paper copy in the mail If you have any lab test that is abnormal or we need to change your treatment, we will call you to review the results.  Testing/Procedures: NONE Follow-Up: At [facility], you and your health needs are our priority.  As part of our continuing mission to provide you with exceptional heart care, we have created designated Provider Care Teams.  These Care Teams include your primary Cardiologist (physician) and Advanced Practice Providers (APPs -  Physician Assistants and Nurse Practitioners) who all work together to provide you with the care you need, when you need it. You will need a follow up appointment in 1 years.  Please call our office 2 months in advance to schedule this appointment.  You may see Ena Dawley, MD or one of the following Advanced Practice Providers on your designated Care Team:   [place], PA-C Melina Copa, PA-C . Ermalinda Barrios, PA-C  Any Other Special Instructions Will Be Listed Below (If Applicable).

## 2018-12-06 NOTE — Progress Notes (Signed)
 12/06/2018 [name] [name]   04/21/1946  [id number]  Primary Physician Butcher, Elizabeth A, MD Primary Cardiologist: Katarina Nelson, MD  Electrophysiologist: None   Reason for Visit/CC: Yearly f/u for CAD and HTN  HPI:   [name] [name] is a 73 y.o. female who presents to clinic today for routine 12 month f/u for CAD. She is a former patient of Dr. Stuckey and now followed by Dr. Nelson. She has a h/o non osbstructive CAD, discovered by [facility] in 2005. She was found to have multiple areas of nonobstructive disease and medical management was elected. Her most recent ischemic evaluation was by nuclear stress testing in 2015, which was negative for ischemia. Most recent 2D echo 02/2017 showed normal LVEF at 65-70%. Mild DD, mild AI, mild MR, mild TR and mildly elevated pulmonary pressure was noted. Additional PMH includes, HTN, HLD DM and PVD>>2015 LE arterial Duplex - Possible mild inflow disease on the left. 0-49% bilateral SFA disease, without focal stenosis. Three vesel run-off, bilaterally. Medical tx.  She was admitted 02/2017 to ICU for acute metabolic encephalopathy with hypovolemic shock and AKI. She required temporary intubation.  Her ACE inhibitor was discontinued due to acute kidney injury and renal function improved. Amlodipine was added in place of ACE for BP control.   She presents to clinic today for routine follow-up.  She is accompanied by her daughter.  She reports that she has done well since her last office visit which was in February 2019.  She denies any hospitalizations or ER visits.  She reports that she has been stable from a cardiovascular standpoint.  She denies any chest pain or dyspnea.  No palpitations, lightheadedness, dizziness, syncope/near syncope.  She denies any claudication.  No LEE, orthopnea or PND. She lives at home with a roommate.  She performs all of her ADLs independently.  She does ambulate however with use of a cane when she is out in public.  No limitations with  ADLs.  She reports full medication compliance.  Blood pressure is well controlled at 134/66.  EKG shows normal sinus rhythm.  Heart rate 78 bpm.  Her PCP checked basic metabolic panel in January which showed normal creatinine at 0.85.  Potassium was normal at 4.0.  She has not had a recent lipid panel.  She is fasting today.  She is on Lipitor 10 mg daily.    Current Meds  Medication Sig  . ACCU-CHEK FASTCLIX LANCETS MISC Check blood sugar 3 times a day  . ACCU-CHEK GUIDE test strip CHECK BLODD SUGAR 3 TIMES A DAY  . aspirin 81 MG tablet Take 1 tablet (81 mg total) by mouth daily.  . atenolol (TENORMIN) 100 MG tablet Take 1 tablet (100 mg total) by mouth daily. Please make yearly appt with Dr. Nelson for February before anymore refills. 1st attempt  . atorvastatin (LIPITOR) 10 MG tablet TAKE 1 TABLET BY MOUTH DAILY  . benazepril (LOTENSIN) 40 MG tablet Take 1 tablet (40 mg total) by mouth daily.  . Blood Glucose Monitoring Suppl (ACCU-CHEK GUIDE) w/Device KIT 1 each by Does not apply route 3 (three) times daily.  . furosemide (LASIX) 40 MG tablet take 1 tablet by mouth once daily  . Insulin Glargine (LANTUS SOLOSTAR) 100 UNIT/ML Solostar Pen Inject 12 Units into the skin daily with breakfast.  . Insulin Pen Needle (BD PEN NEEDLE NANO U/F) 32G X 4 MM MISC USE TO INJECT VICTOZA ONCE A DAY AND LANTUS ONCE A DAY AS DIRECTED  . isosorbide   mononitrate (IMDUR) 30 MG 24 hr tablet TAKE 1 TABLET BY MOUTH ONCE DAILY  . liraglutide (VICTOZA) 18 MG/3ML SOPN ADMINISTER 1.2 MG UNDER THE SKIN DAILY  . loratadine (CLARITIN) 10 MG tablet Take 1 tablet (10 mg total) by mouth daily.  . Melatonin 1 MG TABS Take 1 tablet (1 mg total) by mouth at bedtime.  . metFORMIN (GLUCOPHAGE) 1000 MG tablet Take 1 tablet (1,000 mg total) by mouth 2 (two) times daily with a meal.  . nitroGLYCERIN (NITROSTAT) 0.4 MG SL tablet PLACE 1 TABLET UNDER THE TONGUE EVERY 5 MINUTES AS NEEDED FOR CHEST PAIN  . omeprazole (PRILOSEC) 40 MG  capsule Take 40 mg by mouth 2 (two) times daily.  [name] PARoxetine (PAXIL) 40 MG tablet TAKE 1 TABLET BY MOUTH EVERY MORNING  . pioglitazone (ACTOS) 15 MG tablet Take 1 tablet (15 mg total) by mouth daily.  . polyethylene glycol (MIRALAX) packet Take 17 g by mouth daily.  . pregabalin (LYRICA) 150 MG capsule Take 1 capsule (150 mg total) by mouth daily.  . traMADol (ULTRAM) 50 MG tablet Take 1 tablet (50 mg total) by mouth 2 (two) times daily.  [name] venlafaxine XR (EFFEXOR-XR) 75 MG 24 hr capsule Take 1 capsule (75 mg total) by mouth daily with breakfast.  . vitamin B-12 (CYANOCOBALAMIN) 1000 MCG tablet Take 1,000 mcg by mouth daily.   No Known Allergies Past Medical History:  Diagnosis Date  . Anemia   . Arthritis   . CAD 08/26/2006   Cath 07/05:multiple areas of nonobstructive disease = medical & RF mgmt, well preserved global systolic function. Dr. Lia Foyer. Nuclear med stress test 01/2014 : Normal stress nuclear study. LV Ejection Fraction: 76%. LV Wall Motion: NL LV Function; NL Wall Motion.      . Chronic pain syndrome 01/03/2013   Multifactorial. Non opioid requiring.   L2-5 fusion, with hardware at L2-3, stable per MRI 2010. Followed by neurosurgery (Dr. 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    [name] DEPRESSION 08/26/2006   On paxil    . DIABETES MELLITUS, TYPE II 08/26/2006   Insulin dependent. Victoza added 03/2014  . DYSLIPIDEMIA 11/01/2006   LDL goal < 100, 70 if can achieve without side effects.     . Gallstones   . GERD 08/26/2006   Heartburn QHS.     [name] HYPERTENSION 08/26/2006   On BB, ACEI, lasix, norvasc, metolazone, and imdur.     . OBESITY 08/26/2006   BMI 39.5. Obesity Class 2.     . PVD (peripheral vascular disease) ([place]) 03/27/2014   2015 LE arterial Duplex - Possible mild inflow disease on the left. 0-49% bilateral SFA disease, without focal stenosis. Three vesel run-off, bilaterally. Medical tx.    Family  History  Problem Relation Age of Onset  . Cirrhosis Mother   . Diabetes Father   . Hypertension Father   . Hypertension Sister   . Diabetes Sister   . Hyperlipidemia Sister   . Colon cancer Neg Hx   . Throat cancer Neg Hx    Past Surgical History:  Procedure Laterality Date  . CHOLECYSTECTOMY     pt denies 04/07/16  . endometrial biospy    . LEFT HEART CATH    . lumbar fusion surgery  10/08   L3-L5  . posterior lumbar interfusion surgery  07/18/07   L2-L3  . rotator cuff surgery Bilateral    Social History   Socioeconomic History  .  Marital status: Single    Spouse name: Not on file  . Number of children: 2  . Years of education: Not on file  . Highest education level: Not on file  Occupational History  . Not on file  Social Needs  . Financial resource strain: Not on file  . Food insecurity:    Worry: Not on file    Inability: Not on file  . Transportation needs:    Medical: Not on file    Non-medical: Not on file  Tobacco Use  . Smoking status: Passive Smoke Exposure - Never Smoker  . Smokeless tobacco: Never Used  Substance and Sexual Activity  . Alcohol use: No    Alcohol/week: 0.0 standard drinks  . Drug use: No  . Sexual activity: Not on file  Lifestyle  . Physical activity:    Days per week: Not on file    Minutes per session: Not on file  . Stress: Not on file  Relationships  . Social connections:    Talks on phone: Not on file    Gets together: Not on file    Attends religious service: Not on file    Active member of club or organization: Not on file    Attends meetings of clubs or organizations: Not on file    Relationship status: Not on file  . Intimate partner violence:    Fear of current or ex partner: Not on file    Emotionally abused: Not on file    Physically abused: Not on file    Forced sexual activity: Not on file  Other Topics Concern  . 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 Has two  daughters and several grand kids. Severe limitation in ambulation. Occ goes out to church but usually daughter gets groceries.    She is one of 7 kids and the oldest so helped raise the other 6.     Lipid Panel     Component Value Date/Time   CHOL 130 05/10/2017 0904   TRIG 73 05/10/2017 0904   HDL 58 05/10/2017 0904   CHOLHDL 2.2 05/10/2017 0904   CHOLHDL 1.9 12/30/2015 0832   VLDL 12 12/30/2015 0832   LDLCALC 57 05/10/2017 0904    Review of Systems: General: negative for chills, fever, night sweats or weight changes.  Cardiovascular: negative for chest pain, dyspnea on exertion, edema, orthopnea, palpitations, paroxysmal nocturnal dyspnea or shortness of breath Dermatological: negative for rash Respiratory: negative for cough or wheezing Urologic: negative for hematuria Abdominal: negative for nausea, vomiting, diarrhea, bright red blood per rectum, melena, or hematemesis Neurologic: negative for visual changes, syncope, or dizziness All other systems reviewed and are otherwise negative except as noted above.   Physical Exam:  Blood pressure 134/66, pulse 78, height 5' 3" (1.6 m), weight 215 lb 12.8 oz (97.9 kg), SpO2 99 %.  General appearance: alert, cooperative, no distress and morbidly obese Neck: no carotid bruit and no JVD Lungs: clear to auscultation bilaterally Heart: regular rate and rhythm, S1, S2 normal, no murmur, click, rub or gallop Extremities: extremities normal, atraumatic, no cyanosis or edema Pulses: 2+ and symmetric Skin: Skin color, texture, turgor normal. No rashes or lesions Neurologic: Alert and oriented X 3, normal strength and tone. Normal symmetric reflexes. Normal coordination and gait  EKG NSR 78 bpm -- personally reviewed   ASSESSMENT AND PLAN:   1. CAD: h/o non osbstructive CAD, discovered by [facility] in 2005. She   was found to have multiple areas of nonobstructive disease and medical management was elected. Her most recent ischemic evaluation was  by nuclear stress testing in 2015, which was negative for ischemia. Most recent 2D echo 02/2017 showed normal LVEF at 65-70%.   She denies anginal symptomatology.  No exertional chest pain or dyspnea.  Continue ASA, statin, BB and LA nitrate.   2. HTN:  Controlled on current regimen.  134/66 today.  No changes were made today.  3. HLD: on statin therapy with Lipitor. She is overdue for lipid panel.  She is fasting today.  We will check fasting lipid panel hepatic function test.  Goal LDL in the setting of CAD is less than 70 mg/dL.  4. DM: on insulin. Followed by PCP.   5. PVD: 2015 LE arterial Duplex - Possible mild inflow disease on the left. 0-49% bilateral SFA disease, without focal stenosis. Three vesel run-off, bilaterally. Medical tx. Denies claudication. Continue ASA and statin.   6. Chronic Diastolic Dysfunction: most recent 2D echo 02/2017 showed normal LVEF and mild DD. Euvolemic on physical exam.  No edema and no dyspnea.  Blood pressures controlled.  Continue beta-blocker therapy with atenolol.  7. Valvular Disease: mild. Most recent 2D echo 02/2017 showed mild AL, mild MR and mild TR.  She denies dyspnea.  No significant murmurs noted on auscultation today.   Follow-Up Dr. Meda Coffee in 1 year.  Jena Tegeler Ladoris Gene, MHS [facility] 12/06/2018 10:10 AM

## 2018-12-07 ENCOUNTER — Telehealth: Payer: Self-pay

## 2018-12-07 NOTE — Telephone Encounter (Signed)
-----   Message from Consuelo Pandy, [place] sent at 12/07/2018  8:42 AM EST ----- Cholesterol looks excellent with Lipitor. Levels are well controlled and liver function is normal. Continue Lipitor for cholesterol control.

## 2018-12-07 NOTE — Telephone Encounter (Signed)
Notes recorded by Frederik Schmidt, RN on 12/07/2018 at 10:11 AM EST Lpm with results and told her to call, if questions. 2/26 ------

## 2018-12-08 ENCOUNTER — Emergency Department (HOSPITAL_COMMUNITY): Payer: Medicare Other

## 2018-12-08 ENCOUNTER — Emergency Department (HOSPITAL_COMMUNITY)
Admission: EM | Admit: 2018-12-08 | Discharge: 2018-12-08 | Disposition: A | Discharge status: Home / Self Care | Payer: Medicare Other | Attending: Emergency Medicine | Admitting: Emergency Medicine

## 2018-12-08 ENCOUNTER — Encounter (HOSPITAL_COMMUNITY): Payer: Self-pay | Admitting: Emergency Medicine

## 2018-12-08 DIAGNOSIS — S299XXA Unspecified injury of thorax, initial encounter: Secondary | ICD-10-CM | POA: Diagnosis not present

## 2018-12-08 DIAGNOSIS — I10 Essential (primary) hypertension: Secondary | ICD-10-CM | POA: Insufficient documentation

## 2018-12-08 DIAGNOSIS — Z79899 Other long term (current) drug therapy: Secondary | ICD-10-CM | POA: Insufficient documentation

## 2018-12-08 DIAGNOSIS — I2510 Atherosclerotic heart disease of native coronary artery without angina pectoris: Secondary | ICD-10-CM | POA: Insufficient documentation

## 2018-12-08 DIAGNOSIS — Y929 Unspecified place or not applicable: Secondary | ICD-10-CM | POA: Insufficient documentation

## 2018-12-08 DIAGNOSIS — M542 Cervicalgia: Secondary | ICD-10-CM | POA: Insufficient documentation

## 2018-12-08 DIAGNOSIS — M7918 Myalgia, other site: Secondary | ICD-10-CM

## 2018-12-08 DIAGNOSIS — S4992XA Unspecified injury of left shoulder and upper arm, initial encounter: Secondary | ICD-10-CM | POA: Diagnosis not present

## 2018-12-08 DIAGNOSIS — Y939 Activity, unspecified: Secondary | ICD-10-CM | POA: Diagnosis not present

## 2018-12-08 DIAGNOSIS — E119 Type 2 diabetes mellitus without complications: Secondary | ICD-10-CM | POA: Diagnosis not present

## 2018-12-08 DIAGNOSIS — M545 Low back pain: Secondary | ICD-10-CM | POA: Diagnosis not present

## 2018-12-08 DIAGNOSIS — M546 Pain in thoracic spine: Secondary | ICD-10-CM | POA: Diagnosis not present

## 2018-12-08 DIAGNOSIS — Y999 Unspecified external cause status: Secondary | ICD-10-CM | POA: Insufficient documentation

## 2018-12-08 DIAGNOSIS — M25512 Pain in left shoulder: Secondary | ICD-10-CM | POA: Diagnosis not present

## 2018-12-08 DIAGNOSIS — I739 Peripheral vascular disease, unspecified: Secondary | ICD-10-CM | POA: Insufficient documentation

## 2018-12-08 DIAGNOSIS — Z794 Long term (current) use of insulin: Secondary | ICD-10-CM | POA: Insufficient documentation

## 2018-12-08 DIAGNOSIS — R51 Headache: Secondary | ICD-10-CM | POA: Diagnosis not present
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
## 2018-12-08 NOTE — Discharge Instructions (Addendum)
Please follow up with your primary care provider within 5-7 days for re-evaluation of your symptoms. If you do not have a primary care provider, information for a healthcare clinic has been provided for you to make arrangements for follow up care. Please return to the emergency department for any new or worsening symptoms. ° °

## 2018-12-09 ENCOUNTER — Other Ambulatory Visit: Payer: Self-pay | Admitting: Cardiology

## 2018-12-16 ENCOUNTER — Other Ambulatory Visit: Payer: Self-pay | Admitting: *Deleted

## 2018-12-16 NOTE — Patient Outreach (Signed)
[facility]) Care Management  12/16/2018  [name] [name] [birth year] [id number]   [place] Quarterly Outreach  Referral Date:05/09/2018 Referral Source:Self referral Reason for Referral:"Physician requested her to contact [facility]" [place] [employer]   Outreach Attempt:  Outreach attempt #1 to patient for quarterly update. No answer. RN Health Coach left HIPAA compliant voicemail message along with contact information.  Plan:  RN Health Coach will make another outreach attempt within the month of April.  [place] [phone] [email or web address]

## 2018-12-19 ENCOUNTER — Encounter: Payer: Self-pay | Admitting: Sports Medicine

## 2018-12-19 ENCOUNTER — Ambulatory Visit (INDEPENDENT_AMBULATORY_CARE_PROVIDER_SITE_OTHER): Payer: Medicare Other | Admitting: Sports Medicine

## 2018-12-19 VITALS — BP 105/64 | Ht 62.0 in | Wt 218.0 lb

## 2018-12-19 DIAGNOSIS — M25512 Pain in left shoulder: Secondary | ICD-10-CM | POA: Diagnosis not present

## 2018-12-19 MED ORDER — METHYLPREDNISOLONE ACETATE 80 MG/ML IJ SUSP
80.0000 mg | Freq: Once | INTRAMUSCULAR | Status: AC
  Administered 2018-12-19: 80 mg via INTRAMUSCULAR

## 2018-12-19 MED ORDER — TRAMADOL HCL 50 MG PO TABS: 50.0000 mg | ORAL_TABLET | Freq: Two times a day (BID) | ORAL | 1 refills | Status: DC | PRN
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
## 2018-12-27 ENCOUNTER — Encounter: Payer: Self-pay | Admitting: *Deleted

## 2019-01-03 ENCOUNTER — Other Ambulatory Visit: Payer: Self-pay | Admitting: Internal Medicine

## 2019-01-03 MED ORDER — LORATADINE 10 MG PO TABS: 10.0000 mg | ORAL_TABLET | Freq: Every day | ORAL | 3 refills | Status: DC

## 2019-01-03 MED ORDER — VENLAFAXINE HCL ER 75 MG PO CP24: 75.0000 mg | ORAL_CAPSULE | Freq: Every day | ORAL | 3 refills | Status: DC

## 2019-01-03 MED ORDER — METFORMIN HCL 1000 MG PO TABS: 1000.0000 mg | ORAL_TABLET | Freq: Two times a day (BID) | ORAL | 3 refills | Status: DC

## 2019-01-03 MED ORDER — PIOGLITAZONE HCL 15 MG PO TABS: 15.0000 mg | ORAL_TABLET | Freq: Every day | ORAL | 3 refills | Status: DC

## 2019-01-03 MED ORDER — AMLODIPINE BESYLATE 5 MG PO TABS: 5.0000 mg | ORAL_TABLET | Freq: Every day | ORAL | 3 refills | Status: DC

## 2019-01-03 MED ORDER — ATENOLOL 100 MG PO TABS: 100.0000 mg | ORAL_TABLET | Freq: Every day | ORAL | 3 refills | Status: DC

## 2019-01-06 ENCOUNTER — Ambulatory Visit: Payer: Medicare Other | Admitting: Podiatry

## 2019-01-09 ENCOUNTER — Ambulatory Visit: Payer: Medicare Other | Admitting: Physical Therapy

## 2019-01-20 ENCOUNTER — Ambulatory Visit: Payer: Self-pay | Admitting: *Deleted

## 2019-01-23 ENCOUNTER — Encounter: Payer: Self-pay | Admitting: Gastroenterology

## 2019-01-24 ENCOUNTER — Other Ambulatory Visit: Payer: Self-pay

## 2019-01-24 ENCOUNTER — Other Ambulatory Visit: Payer: Self-pay | Admitting: *Deleted

## 2019-01-24 ENCOUNTER — Encounter: Payer: Self-pay | Admitting: *Deleted
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
## 2019-01-27 ENCOUNTER — Other Ambulatory Visit: Payer: Self-pay | Admitting: Internal Medicine

## 2019-02-06 ENCOUNTER — Other Ambulatory Visit: Payer: Self-pay | Admitting: Internal Medicine

## 2019-02-15 ENCOUNTER — Ambulatory Visit (AMBULATORY_SURGERY_CENTER): Payer: Self-pay

## 2019-02-15 ENCOUNTER — Ambulatory Visit: Payer: Medicare Other | Admitting: Podiatry

## 2019-02-15 ENCOUNTER — Other Ambulatory Visit: Payer: Self-pay

## 2019-02-15 VITALS — Ht 62.0 in | Wt 218.0 lb

## 2019-02-15 DIAGNOSIS — Z860100 Personal history of colon polyps, unspecified: Secondary | ICD-10-CM

## 2019-02-15 MED ORDER — NA SULFATE-K SULFATE-MG SULF 17.5-3.13-1.6 GM/177ML PO SOLN: 1.0000 | Freq: Once | ORAL | 0 refills | Status: AC

## 2019-02-15 NOTE — Progress Notes (Signed)
Denies allergies to eggs or soy products. Denies complication of anesthesia or sedation. Denies use of weight loss medication. Denies use of O2.   Emmi instructions given for colonoscopy.  Pre-Visit was conducted by phone due to Covid 19. Instructions were reviewed and mailed to confirmed home address. Patient was encouraged to call if she had any questions or concerns regarding procedure or instructions.

## 2019-02-20 ENCOUNTER — Encounter: Payer: Self-pay | Admitting: Podiatry

## 2019-02-20 ENCOUNTER — Other Ambulatory Visit: Payer: Self-pay

## 2019-02-20 ENCOUNTER — Ambulatory Visit (INDEPENDENT_AMBULATORY_CARE_PROVIDER_SITE_OTHER): Payer: Medicare Other | Admitting: Podiatry

## 2019-02-20 VITALS — Temp 97.3°F

## 2019-02-20 DIAGNOSIS — M79675 Pain in left toe(s): Secondary | ICD-10-CM | POA: Diagnosis not present

## 2019-02-20 DIAGNOSIS — B351 Tinea unguium: Secondary | ICD-10-CM

## 2019-02-20 DIAGNOSIS — L84 Corns and callosities: Secondary | ICD-10-CM

## 2019-02-20 DIAGNOSIS — M79674 Pain in right toe(s): Secondary | ICD-10-CM

## 2019-02-20 DIAGNOSIS — E1151 Type 2 diabetes mellitus with diabetic peripheral angiopathy without gangrene: Secondary | ICD-10-CM | POA: Diagnosis not present

## 2019-02-20 NOTE — Patient Instructions (Addendum)
Diabetes Mellitus and Foot Care Foot care is an important part of your health, especially when you have diabetes. Diabetes may cause you to have problems because of poor blood flow (circulation) to your feet and legs, which can cause your skin to:  Become thinner and drier.  Break more easily.  Heal more slowly.  Peel and crack. You may also have nerve damage (neuropathy) in your legs and feet, causing decreased feeling in them. This means that you may not notice minor injuries to your feet that could lead to more serious problems. Noticing and addressing any potential problems early is the best way to prevent future foot problems. How to care for your feet Foot hygiene  Wash your feet daily with warm water and mild soap. Do not use hot water. Then, pat your feet and the areas between your toes until they are completely dry. Do not soak your feet as this can dry your skin.  Trim your toenails straight across. Do not dig under them or around the cuticle. File the edges of your nails with an emery board or nail file.  Apply a moisturizing lotion or petroleum jelly to the skin on your feet and to dry, brittle toenails. Use lotion that does not contain alcohol and is unscented. Do not apply lotion between your toes. Shoes and socks  Wear clean socks or stockings every day. Make sure they are not too tight. Do not wear knee-high stockings since they may decrease blood flow to your legs.  Wear shoes that fit properly and have enough cushioning. Always look in your shoes before you put them on to be sure there are no objects inside.  To break in new shoes, wear them for just a few hours a day. This prevents injuries on your feet. Wounds, scrapes, corns, and calluses  Check your feet daily for blisters, cuts, bruises, sores, and redness. If you cannot see the bottom of your feet, use a mirror or ask someone for help.  Do not cut corns or calluses or try to remove them with medicine.  If you  find a minor scrape, cut, or break in the skin on your feet, keep it and the skin around it clean and dry. You may clean these areas with mild soap and water. Do not clean the area with peroxide, alcohol, or iodine.  If you have a wound, scrape, corn, or callus on your foot, look at it several times a day to make sure it is healing and not infected. Check for: ? Redness, swelling, or pain. ? Fluid or blood. ? Warmth. ? Pus or a bad smell. General instructions  Do not cross your legs. This may decrease blood flow to your feet.  Do not use heating pads or hot water bottles on your feet. They may burn your skin. If you have lost feeling in your feet or legs, you may not know this is happening until it is too late.  Protect your feet from hot and cold by wearing shoes, such as at the beach or on hot pavement.  Schedule a complete foot exam at least once a year (annually) or more often if you have foot problems. If you have foot problems, report any cuts, sores, or bruises to your health care provider immediately. Contact a health care provider if:  You have a medical condition that increases your risk of infection and you have any cuts, sores, or bruises on your feet.  You have an injury that is not   healing.  You have redness on your legs or feet.  You feel burning or tingling in your legs or feet.  You have pain or cramps in your legs and feet.  Your legs or feet are numb.  Your feet always feel cold.  You have pain around a toenail. Get help right away if:  You have a wound, scrape, corn, or callus on your foot and: ? You have pain, swelling, or redness that gets worse. ? You have fluid or blood coming from the wound, scrape, corn, or callus. ? Your wound, scrape, corn, or callus feels warm to the touch. ? You have pus or a bad smell coming from the wound, scrape, corn, or callus. ? You have a fever. ? You have a red line going up your leg. Summary  Check your feet every day  for cuts, sores, red spots, swelling, and blisters.  Moisturize feet and legs daily.  Wear shoes that fit properly and have enough cushioning.  If you have foot problems, report any cuts, sores, or bruises to your health care provider immediately.  Schedule a complete foot exam at least once a year (annually) or more often if you have foot problems. This information is not intended to replace advice given to you by your health care provider. Make sure you discuss any questions you have with your health care provider. Document Released: 09/25/2000 Document Revised: 11/10/2017 Document Reviewed: 10/30/2016 Elsevier Interactive Patient Education  2019 Elsevier Inc.  Onychomycosis/Fungal Toenails  WHAT IS IT? An infection that lies within the keratin of your nail plate that is caused by a fungus.  WHY ME? Fungal infections affect all ages, sexes, races, and creeds.  There may be many factors that predispose you to a fungal infection such as age, coexisting medical conditions such as diabetes, or an autoimmune disease; stress, medications, fatigue, genetics, etc.  Bottom line: fungus thrives in a warm, moist environment and your shoes offer such a location.  IS IT CONTAGIOUS? Theoretically, yes.  You do not want to share shoes, nail clippers or files with someone who has fungal toenails.  Walking around barefoot in the same room or sleeping in the same bed is unlikely to transfer the organism.  It is important to realize, however, that fungus can spread easily from one nail to the next on the same foot.  HOW DO WE TREAT THIS?  There are several ways to treat this condition.  Treatment may depend on many factors such as age, medications, pregnancy, liver and kidney conditions, etc.  It is best to ask your doctor which options are available to you.  1. No treatment.   Unlike many other medical concerns, you can live with this condition.  However for many people this can be a painful condition and  may lead to ingrown toenails or a bacterial infection.  It is recommended that you keep the nails cut short to help reduce the amount of fungal nail. 2. Topical treatment.  These range from herbal remedies to prescription strength nail lacquers.  About 40-50% effective, topicals require twice daily application for approximately 9 to 12 months or until an entirely new nail has grown out.  The most effective topicals are medical grade medications available through physicians offices. 3. Oral antifungal medications.  With an 80-90% cure rate, the most common oral medication requires 3 to 4 months of therapy and stays in your system for a year as the new nail grows out.  Oral antifungal medications do require   blood work to make sure it is a safe drug for you.  A liver function panel will be performed prior to starting the medication and after the first month of treatment.  It is important to have the blood work performed to avoid any harmful side effects.  In general, this medication safe but blood work is required. 4. Laser Therapy.  This treatment is performed by applying a specialized laser to the affected nail plate.  This therapy is noninvasive, fast, and non-painful.  It is not covered by insurance and is therefore, out of pocket.  The results have been very good with a 80-95% cure rate.  The [facility] is the only practice in the area to offer this therapy. 5. Permanent Nail Avulsion.  Removing the entire nail so that a new nail will not grow back. 

## 2019-02-22 ENCOUNTER — Telehealth: Payer: Self-pay | Admitting: Physical Therapy

## 2019-02-22 NOTE — Telephone Encounter (Signed)
Spoke with patient. Advised that her health places her in the high risk category for [id number] complications. Her insurance does not cover telehealth PT at this time but she would like to come to the clinic for a visit to establish an HEP.  Offie Pickron C. Daray Polgar PT, DPT 02/22/19 1:05 PM

## 2019-02-23 ENCOUNTER — Other Ambulatory Visit: Payer: Self-pay | Admitting: Sports Medicine

## 2019-02-23 MED ORDER — TRAMADOL HCL 50 MG PO TABS: 50.0000 mg | ORAL_TABLET | Freq: Two times a day (BID) | ORAL | 1 refills | Status: DC | PRN

## 2019-02-26 ENCOUNTER — Encounter: Payer: Self-pay | Admitting: Podiatry

## 2019-02-26 ENCOUNTER — Telehealth: Payer: Self-pay | Admitting: *Deleted

## 2019-02-26 NOTE — Telephone Encounter (Signed)
Covid-19 travel screening questions  Have you traveled in the last 14 days? No If yes where?  Do you now or have you had a fever in the last 14 days? NO  Do you have any respiratory symptoms of shortness of breath or cough now or in the last 14 days? No  Do you have a medical history of Congestive Heart Failure?  Do you have a medical history of lung disease?  Do you have any family members or close contacts with diagnosed or suspected Covid-19? NO  Pt made aware of care partner policy and to bring her mask with her

## 2019-02-26 NOTE — Progress Notes (Signed)
Subjective: [name] [name] presents to clinic for preventative diabetic foot care. She c/o painful, elongated toenails b/l feet. She also has plantar calluses b/l feet.   She voices no new problems on today's visit. Bartholomew Crews, MD is her PCP and last visit was 11/03/2018.   Current Outpatient Medications:  .  ACCU-CHEK FASTCLIX LANCETS MISC, Check blood sugar 3 times a day, Disp: 102 each, Rfl: 11 .  ACCU-CHEK GUIDE test strip, CHECK BLODD SUGAR 3 TIMES A DAY, Disp: 100 each, Rfl: 5 .  amLODipine (NORVASC) 5 MG tablet, Take 1 tablet (5 mg total) by mouth daily., Disp: 90 tablet, Rfl: 3 .  aspirin 81 MG tablet, Take 1 tablet (81 mg total) by mouth daily., Disp: 90 tablet, Rfl: 3 .  atenolol (TENORMIN) 100 MG tablet, Take 1 tablet (100 mg total) by mouth daily., Disp: 90 tablet, Rfl: 3 .  atorvastatin (LIPITOR) 10 MG tablet, TAKE 1 TABLET BY MOUTH DAILY, Disp: 90 tablet, Rfl: 3 .  benazepril (LOTENSIN) 40 MG tablet, TAKE 1 TABLET(40 MG) BY MOUTH DAILY, Disp: 90 tablet, Rfl: 1 .  Blood Glucose Monitoring Suppl (ACCU-CHEK GUIDE) w/Device KIT, 1 each by Does not apply route 3 (three) times daily., Disp: 1 kit, Rfl: 0 .  furosemide (LASIX) 40 MG tablet, TAKE 1 TABLET BY MOUTH ONCE DAILY, Disp: 90 tablet, Rfl: 3 .  Insulin Glargine (LANTUS SOLOSTAR) 100 UNIT/ML Solostar Pen, Inject 12 Units into the skin daily with breakfast., Disp: 15 mL, Rfl: 2 .  Insulin Pen Needle (BD PEN NEEDLE NANO U/F) 32G X 4 MM MISC, USE TO INJECT VICTOZA ONCE A DAY AND LANTUS ONCE A DAY AS DIRECTED, Disp: 100 each, Rfl: 5 .  isosorbide mononitrate (IMDUR) 30 MG 24 hr tablet, TAKE 1 TABLET BY MOUTH ONCE DAILY, Disp: 90 tablet, Rfl: 3 .  liraglutide (VICTOZA) 18 MG/3ML SOPN, ADMINISTER 1.2 MG UNDER THE SKIN DAILY, Disp: 18 mL, Rfl: 5 .  loratadine (CLARITIN) 10 MG tablet, Take 1 tablet (10 mg total) by mouth daily., Disp: 90 tablet, Rfl: 3 .  Melatonin 1 MG TABS, Take 1 tablet (1 mg total) by mouth at bedtime., Disp:  90 tablet, Rfl: 3 .  metFORMIN (GLUCOPHAGE) 1000 MG tablet, Take 1 tablet (1,000 mg total) by mouth 2 (two) times daily with a meal., Disp: 180 tablet, Rfl: 3 .  nitroGLYCERIN (NITROSTAT) 0.4 MG SL tablet, PLACE 1 TABLET UNDER THE TONGUE EVERY 5 MINUTES AS NEEDED FOR CHEST PAIN, Disp: 25 tablet, Rfl: 0 .  omeprazole (PRILOSEC) 40 MG capsule, Take 40 mg by mouth 2 (two) times daily., Disp: , Rfl:  .  PARoxetine (PAXIL) 40 MG tablet, TAKE 1 TABLET BY MOUTH EVERY MORNING, Disp: 90 tablet, Rfl: 3 .  pioglitazone (ACTOS) 15 MG tablet, Take 1 tablet (15 mg total) by mouth daily., Disp: 90 tablet, Rfl: 3 .  polyethylene glycol (MIRALAX) packet, Take 17 g by mouth daily., Disp: 14 each, Rfl: 0 .  pregabalin (LYRICA) 150 MG capsule, TAKE 1 CAPSULE(150 MG) BY MOUTH DAILY, Disp: 90 capsule, Rfl: 3 .  SUPREP BOWEL PREP KIT 17.5-3.13-1.6 GM/177ML SOLN, , Disp: , Rfl:  .  venlafaxine XR (EFFEXOR-XR) 75 MG 24 hr capsule, Take 1 capsule (75 mg total) by mouth daily with breakfast., Disp: 90 capsule, Rfl: 3 .  vitamin B-12 (CYANOCOBALAMIN) 1000 MCG tablet, Take 1,000 mcg by mouth daily., Disp: , Rfl:  .  traMADol (ULTRAM) 50 MG tablet, Take 1 tablet (50 mg total) by mouth 2 (  two) times daily as needed., Disp: 60 tablet, Rfl: 1  No Known Allergies  Vascular Examination: Capillary refill time immediate x 10 digits.  Dorsalis pedis pulses diminished b/l.  Posterior tibial pulses absent b/l.  Digital hair absent x 10 digits.  Skin temperature gradient WNL b/l.  Dermatological Examination: Skin with normal turgor, texture and tone b/l.  Toenails 1-5 b/l discolored, thick, dystrophic with subungual debris and pain with palpation to nailbeds due to thickness of nails.  Hyperkeratotic lesions submet heads 1, 5 b/l. No erythema, no edema, no drainage, no flocculence noted.   Musculoskeletal: Muscle strength 5/5 to all LE muscle groups.  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  Neurological: Sensation absent   with 10 gram monofilament.  Assessment: 1. Painful onychomycosis toenails 1-5 b/l 2. Calluses submet heads 1, 5 b/l 3. NIDDM with Diabetic neuropathy  Plan: 1. Continue diabetic foot care principles. Literature dispensed on today. 2. Toenails 1-5 b/l were debrided in length and girth without iatrogenic bleeding. 3. Calluses pared submetatarsal head(s) 1, 5 b/l utilizing sterile scalpel blade without incident. Corn(s) pared utilizing sterile scalpel blade without incident.  4. Patient to continue soft, supportive shoe gear 5. Patient to report any pedal injuries to medical professional  6. Follow up 3 months.  7. Patient/POA to call should there be a concern in the interim.

## 2019-02-28 ENCOUNTER — Other Ambulatory Visit: Payer: Self-pay

## 2019-02-28 ENCOUNTER — Ambulatory Visit (AMBULATORY_SURGERY_CENTER): Payer: Medicare Other | Admitting: Gastroenterology

## 2019-02-28 ENCOUNTER — Encounter: Payer: Self-pay | Admitting: Gastroenterology

## 2019-02-28 VITALS — BP 114/58 | HR 99 | Temp 96.9°F | Resp 18 | Ht 62.0 in | Wt 218.0 lb

## 2019-02-28 DIAGNOSIS — Z1211 Encounter for screening for malignant neoplasm of colon: Secondary | ICD-10-CM

## 2019-02-28 DIAGNOSIS — Z538 Procedure and treatment not carried out for other reasons: Secondary | ICD-10-CM | POA: Diagnosis not present

## 2019-02-28 DIAGNOSIS — I2510 Atherosclerotic heart disease of native coronary artery without angina pectoris: Secondary | ICD-10-CM | POA: Diagnosis not present

## 2019-02-28 DIAGNOSIS — Z86010 Personal history of colonic polyps: Secondary | ICD-10-CM | POA: Diagnosis not present

## 2019-02-28 MED ORDER — SODIUM CHLORIDE 0.9 % IV SOLN: 500.0000 mL | Freq: Once | INTRAVENOUS | Status: DC

## 2019-02-28 NOTE — Op Note (Signed)
[facility] Patient Name: [name] [name] Procedure Date: 02/28/2019 9:57 AM MRN: [id number] Endoscopist: Remo Lipps P. Havery Moros , MD Age: 73 Referring MD:  Date of Birth: [birth year] Gender: Female Account #: [id number] Procedure:                Colonoscopy Indications:              Screening for colorectal malignant neoplasm Medicines:                Monitored Anesthesia Care Procedure:                Pre-Anesthesia Assessment:                           - Prior to the procedure, a History and Physical                            was performed, and patient medications and                            allergies were reviewed. The patient's tolerance of                            previous anesthesia was also reviewed. The risks                            and benefits of the procedure and the sedation                            options and risks were discussed with the patient.                            All questions were answered, and informed consent                            was obtained. Prior Anticoagulants: The patient has                            taken no previous anticoagulant or antiplatelet                            agents. ASA Grade Assessment: III - A patient with                            severe systemic disease. After reviewing the risks                            and benefits, the patient was deemed in                            satisfactory condition to undergo the procedure.                           After obtaining informed consent, the colonoscope  was passed under direct vision. Throughout the                            procedure, the patient's blood pressure, pulse, and                            oxygen saturations were monitored continuously. The                            Colonoscope was introduced through the anus and                            advanced to the the cecum, identified by the                            ileocecal  valve. The colonoscopy was performed                            without difficulty. The patient tolerated the                            procedure well. The quality of the bowel                            preparation was poor. The ileocecal valve was                            photographed. Scope In: 10:24:28 AM Scope Out: 10:31:05 AM Scope Withdrawal Time: 0 hours 1 minute 51 seconds  Total Procedure Duration: 0 hours 6 minutes 37 seconds  Findings:                 The perianal and digital rectal examinations were                            normal.                           A large amount of semi-solid stool was found in the                            entire colon, precluding visualization, worst in                            the left and right colon. I tried to lavage the                            stool to achieve adequate views but it was not                            possible, visualization was inadequate for                            screening purposes and the procedure was aborted.  Of the visualized colonic mucosa, no obvious mass                            lesions or polyps noted. Complications:            No immediate complications. Estimated blood loss:                            None. Estimated Blood Loss:     Estimated blood loss: none. Impression:               - Preparation of the colon was poor, visualization                            was inadequate for screening purposes. Recommendation:           - Patient has a contact number available for                            emergencies. The signs and symptoms of potential                            delayed complications were discussed with the                            patient. Return to normal activities tomorrow.                            Written discharge instructions were provided to the                            patient.                           - Resume previous diet.                            - Continue present medications.                           - Repeat colonoscopy because the bowel preparation                            was suboptimal, using 2 day / double prep.                           - Continue Miralax for baseline constipation Remo Lipps P. Najia Hurlbutt, MD 02/28/2019 10:39:08 AM This report has been signed electronically.

## 2019-02-28 NOTE — Patient Instructions (Addendum)
Resume usual diet & medications  Will need to repeat Colonoscopy with a more extensive preparation due to suboptimal prep today- this is scheduled for you ( see Next page for date & time for procedure and date and time for visit with nurse for instructions)   Continue Miralax for baseline constipation    YOU HAD AN ENDOSCOPIC PROCEDURE TODAY AT [facility]:   Refer to the procedure report that was given to you for any specific questions about what was found during the examination.  If the procedure report does not answer your questions, please call your gastroenterologist to clarify.  If you requested that your care partner not be given the details of your procedure findings, then the procedure report has been included in a sealed envelope for you to review at your convenience later.  YOU SHOULD EXPECT: Some feelings of bloating in the abdomen. Passage of more gas than usual.  Walking can help get rid of the air that was put into your GI tract during the procedure and reduce the bloating. If you had a lower endoscopy (such as a colonoscopy or flexible sigmoidoscopy) you may notice spotting of blood in your stool or on the toilet paper. If you underwent a bowel prep for your procedure, you may not have a normal bowel movement for a few days.  Please Note:  You might notice some irritation and congestion in your nose or some drainage.  This is from the oxygen used during your procedure.  There is no need for concern and it should clear up in a day or so.  SYMPTOMS TO REPORT IMMEDIATELY:   Following lower endoscopy (colonoscopy or flexible sigmoidoscopy):  Excessive amounts of blood in the stool  Significant tenderness or worsening of abdominal pains  Swelling of the abdomen that is new, acute  Fever of 100F or higher    For urgent or emergent issues, a gastroenterologist can be reached at any hour by calling [phone].   DIET:  We do recommend a small meal at  first, but then you may proceed to your regular diet.  Drink plenty of fluids but you should avoid alcoholic beverages for 24 hours.  ACTIVITY:  You should plan to take it easy for the rest of today and you should NOT DRIVE or use heavy machinery until tomorrow (because of the sedation medicines used during the test).    FOLLOW UP: Our staff will call the number listed on your records 48-72 hours following your procedure to check on you and address any questions or concerns that you may have regarding the information given to you following your procedure. If we do not reach you, we will leave a message.  We will attempt to reach you two times.  During this call, we will ask if you have developed any symptoms of COVID 19. If you develop any symptoms (for example fever, flu-like symptoms, shortness of breath, cough etc.) before then, please call [phone].  If any biopsies were taken you will be contacted by phone or by letter within the next 1-3 weeks.  Please call us at [phone] if you have not heard about the biopsies in 3 weeks.    SIGNATURES/CONFIDENTIALITY: You and/or your care partner have signed paperwork which will be entered into your electronic medical record.  These signatures attest to the fact that that the information above on your After Visit Summary has been reviewed and is understood.  Full responsibility of the confidentiality of this  discharge information lies with you and/or your care-partner. 

## 2019-02-28 NOTE — Progress Notes (Signed)
Report to PACU, RN, vss, BBS= Clear.  

## 2019-02-28 NOTE — Progress Notes (Signed)
Pt's states no medical or surgical changes since previsit or office visit. Riki Sheer LPN temp and vital signs.

## 2019-03-02 ENCOUNTER — Other Ambulatory Visit: Payer: Self-pay

## 2019-03-02 ENCOUNTER — Ambulatory Visit: Payer: Medicare Other | Attending: Sports Medicine | Admitting: Physical Therapy

## 2019-03-02 ENCOUNTER — Encounter: Payer: Self-pay | Admitting: Physical Therapy

## 2019-03-02 ENCOUNTER — Telehealth: Payer: Self-pay

## 2019-03-02 DIAGNOSIS — G8929 Other chronic pain: Secondary | ICD-10-CM | POA: Diagnosis not present

## 2019-03-02 DIAGNOSIS — M6281 Muscle weakness (generalized): Secondary | ICD-10-CM | POA: Insufficient documentation

## 2019-03-02 DIAGNOSIS — M25512 Pain in left shoulder: Secondary | ICD-10-CM | POA: Diagnosis not present

## 2019-03-02 DIAGNOSIS — M542 Cervicalgia: Secondary | ICD-10-CM | POA: Diagnosis not present

## 2019-03-02 NOTE — Patient Instructions (Addendum)
Access Code: [email or web address]  URL: [email or web address]  Date: 03/02/2019  Prepared by: Elsie Ra   Exercises  Supine Chin Tuck - 10 reps - 3 sets - 2x daily - 6x weekly  Supine Shoulder Flexion AAROM with Dowel - 10 reps - 3 sets - 2x daily - 6x weekly  Seated Cervical Sidebending Stretch - 3 sets - 30 hold - 2x daily - 6x weekly  Seated Assisted Cervical Rotation with Towel - 10 reps - 1-2 sets - 5 hold - 2x daily - 6x weekly  Seated Scapular Retraction - 10 reps - 2-3 sets - 5 sec hold - 2x daily - 6x weekly    TENS UNIT: This is helpful for muscle pain and spasm.   Search and Purchase a TENS 7000 2nd edition at www.tenspros.com. It should be less than $30.     TENS unit instructions: Do not shower or bathe with the unit on Turn the unit off before removing electrodes or batteries If the electrodes lose stickiness add a drop of water to the electrodes after they are disconnected from the unit and place on plastic sheet. If you continued to have difficulty, call the TENS unit company to purchase more electrodes. Do not apply lotion on the skin area prior to use. Make sure the skin is clean and dry as this will help prolong the life of the electrodes. After use, always check skin for unusual red areas, rash or other skin difficulties. If there are any skin problems, does not apply electrodes to the same area. Never remove the electrodes from the unit by pulling the wires. Do not use the TENS unit or electrodes other than as directed. Do not change electrode placement without consultating your therapist or physician. Keep 2 fingers with between each electrode. Wear time ratio is 2:1, on to off times.    For example on for 30 minutes off for 15 minutes and then on for 30 minutes off for 15 minutes

## 2019-03-02 NOTE — Addendum Note (Signed)
Addended by: Debbe Odea on: 03/02/2019 12:21 PM   Modules accepted: Orders

## 2019-03-02 NOTE — Telephone Encounter (Signed)
  Follow up Call-  Call back number 02/28/2019 06/02/2018  Post procedure Call Back phone  # [phone] cell number ok to speak to her. [phone]  Permission to leave phone message Yes Yes  Some recent data might be hidden     Patient questions:  Do you have a fever, pain , or abdominal swelling? No. Pain Score  0 *  Have you tolerated food without any problems? Yes.    Have you been able to return to your normal activities? Yes.    Do you have any questions about your discharge instructions: Diet   No. Medications  No. Follow up visit  No.  Do you have questions or concerns about your Care? No.  Actions: * If pain score is 4 or above: No action needed, pain <4.    1. Have you developed a fever since your procedure? No.  2.   Have you had an respiratory symptoms (SOB or cough) since your procedure? No.  3.   Have you tested positive for COVID 19 since your procedure No.  3.   Have you had any family members/close contacts diagnosed with the COVID 19 since your procedure?  No   If any of these questions are a yes, please inquire if patient has been seen by family doctor and route this note to Joylene John, Therapist, sports.
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
## 2019-03-08 ENCOUNTER — Other Ambulatory Visit: Payer: Self-pay

## 2019-03-08 ENCOUNTER — Encounter: Payer: Self-pay | Admitting: Physical Therapy

## 2019-03-08 ENCOUNTER — Ambulatory Visit: Payer: Medicare Other | Admitting: Physical Therapy

## 2019-03-08 DIAGNOSIS — G8929 Other chronic pain: Secondary | ICD-10-CM

## 2019-03-08 DIAGNOSIS — M542 Cervicalgia: Secondary | ICD-10-CM | POA: Diagnosis not present

## 2019-03-08 DIAGNOSIS — M25512 Pain in left shoulder: Secondary | ICD-10-CM | POA: Diagnosis not present

## 2019-03-08 DIAGNOSIS — M6281 Muscle weakness (generalized): Secondary | ICD-10-CM

## 2019-03-08 NOTE — Therapy (Signed)
[place], [place], [address] Phone: [phone]   Fax:  [phone]  Physical Therapy Treatment  Patient Details  Name: [name] [name] MRN: [id number] Date of Birth: 07/28/1946 Referring Provider (PT): Thurman Coyer, DO   Encounter Date: 03/08/2019  PT End of Session - 03/08/19 1059    Visit Number  2    Number of Visits  12    Date for PT Re-Evaluation  04/13/19    Authorization Type  [employer] MCR/MCD progress at 71, KX at 15    PT Start Time  1055    PT Stop Time  1139    PT Time Calculation (min)  44 min       Past Medical History:  Diagnosis Date  . Anemia   . Arthritis   . Blood transfusion without reported diagnosis   . CAD 08/26/2006   Cath 07/05:multiple areas of nonobstructive disease = medical & RF mgmt, well preserved global systolic function. Dr. Lia Foyer. Nuclear med stress test 01/2014 : Normal stress nuclear study. LV Ejection Fraction: 76%. LV Wall Motion: NL LV Function; NL Wall Motion.      . Cataract   . Chronic kidney disease   . Chronic pain syndrome 01/03/2013   Multifactorial. Non opioid requiring.   L2-5 fusion, with hardware at L2-3, stable per MRI 2010. Followed by neurosurgery (Dr. 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    [name] DEPRESSION 08/26/2006   On paxil    . DIABETES MELLITUS, TYPE II 08/26/2006   Insulin dependent. Victoza added 03/2014  . DYSLIPIDEMIA 11/01/2006   LDL goal < 100, 70 if can achieve without side effects.     . Gallstones   . GERD 08/26/2006   Heartburn QHS.     . Glaucoma   . HYPERTENSION 08/26/2006   On BB, ACEI, lasix, norvasc, metolazone, and imdur.     . OBESITY 08/26/2006   BMI 39.5. Obesity Class 2.     . PVD (peripheral vascular disease) ([place]) 03/27/2014   2015 LE arterial Duplex - Possible mild inflow disease on the left. 0-49% bilateral SFA disease, without focal stenosis.  Three vesel run-off, bilaterally. Medical tx.     Past Surgical History:  Procedure Laterality Date  . CHOLECYSTECTOMY     pt denies 04/07/16  . endometrial biospy    . LEFT HEART CATH    . lumbar fusion surgery  10/08   L3-L5  . posterior lumbar interfusion surgery  07/18/07   L2-L3  . rotator cuff surgery Bilateral     There were no vitals filed for this visit.  Subjective Assessment - 03/08/19 1057    Subjective  No pain right now. Doing the HEP. Feels the HEP is helpful.     Currently in Pain?  No/denies                       [facility] Adult PT Treatment/Exercise - 03/08/19 0001      Exercises   Exercises  Neck;Shoulder      Shoulder Exercises: Supine   Other Supine Exercises  supine cane pressups,  flexion, horizontal abduction/adduction, ER AROM       Shoulder Exercises: Seated   Other Seated Exercises  shoulder rolls x 10 each way     Other Seated Exercises  scap retraction x 10       Shoulder Exercises: Pulleys   Flexion  2 minutes      Moist Heat Therapy   Number Minutes Moist Heat  10 Minutes    Moist Heat Location  Shoulder;Cervical      Electrical Stimulation   Electrical Stimulation Location  Lt neck and shoulder    Electrical Stimulation Action  IFCx 10    Electrical Stimulation Parameters  to tolerance, pt supine    Electrical Stimulation Goals  Tone;Pain      Neck Exercises: Stretches   Upper Trapezius Stretch  10 seconds;3 reps    Other Neck Stretches  AROM cervical rotation 3 x 5 sec each    also with towel assist                  PT Long Term Goals - 03/02/19 1213      PT LONG TERM GOAL #1   Title  Pt will be I and compliant with HEP. (Target goal for all goals 6 weeks 04/13/19)    Status  New      PT LONG TERM GOAL #2   Title  Pt will improve ROM in neck and Left shoulder to [facility] to improve mobility    Status  New      PT LONG TERM GOAL #3   Title  Pt will improve strength to at least 4/5 MMT to improve function     Baseline  3+    Status  New      PT LONG TERM GOAL #4   Title  Pt will improve FOTO to less than 42 % limited to show improved function    Baseline  58      PT LONG TERM GOAL #5   Title  Pt will reduce pain by overall 50%    Status  New            Plan - 03/08/19 1114    Clinical Impression Statement  Pt reports improvement with HEP and IFC treatment last visit. She has no pain today.  Min cues for HEP, and overall has good technique. She forgot supine cane flexion so reviewed and progressed. Progressed to pulleys and repeated IFC/HMP per pt request.     PT Next Visit Plan  begin strengthening if still without pain next visit- begin neck and shoulder stretching, ROM, progress reaching and UE strength as able, consider modalaties and MT for pain    PT Home Exercise Plan  chin tucks supine, cerv rotation with towel, scap retraction, supine wand flexion, UT stretch    Consulted and Agree with Plan of Care  Patient       Patient will benefit from skilled therapeutic intervention in order to improve the following deficits and impairments:  Decreased endurance, Decreased activity tolerance, Decreased range of motion, Decreased strength, Hypomobility, Impaired flexibility, Increased muscle spasms, Postural dysfunction, Pain  Visit Diagnosis: Chronic left shoulder pain  Cervicalgia  Muscle weakness (generalized)     Problem List Patient Active Problem List   Diagnosis Date Noted  . Ankle swelling, left 11/03/2018  . Esophageal dysphagia 03/03/2018  . Gait instability 03/03/2018  . Aortic atherosclerosis ([place]) 06/17/2017  . 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 Right carotid bruit 04/08/2017  . CKD stage 3 due to type 2 diabetes mellitus ([place]) 12/11/2016  . Diabetic neuropathy ([place]) 12/10/2016  . Small intestinal bacterial overgrowth 12/24/2015  . Goals of care, counseling/discussion 01/18/2015  . ASCUS with positive high risk HPV 08/03/2014  . PVD (peripheral  vascular disease) ([place]) 03/27/2014  . Diastolic  CHF, chronic ([place]) 12/21/2013  . Healthcare maintenance 04/25/2013  . Chronic pain syndrome 01/03/2013  . Insomnia 12/22/2011  . Vitamin B12 deficiency 12/27/2009  . Vitamin D deficiency 12/27/2009  . Anemia 12/27/2009  . Hypomagnesemia 12/29/2008  . Osteopenia 08/01/2008  . Hyperlipidemia 11/01/2006  . GLAUCOMA NOS 11/01/2006  . DM (diabetes mellitus), type 2, uncontrolled, periph vascular complic ([place]) 51/89/8421  . Obesity 08/26/2006  . Depression 08/26/2006  . HTN (hypertension) 08/26/2006  . Coronary atherosclerosis 08/26/2006  . GERD 08/26/2006    Dorene Ar, PTA 03/08/2019, 11:32 AM  [facility] [address] [place], [place], [address] Phone: [phone]   Fax:  [phone]  Name: MYLA MAURIELLO MRN: [id number] Date of Birth: December 15, 1945

## 2019-03-10 ENCOUNTER — Encounter: Payer: Self-pay | Admitting: Physical Therapy

## 2019-03-10 ENCOUNTER — Other Ambulatory Visit: Payer: Self-pay

## 2019-03-10 ENCOUNTER — Ambulatory Visit: Payer: Medicare Other | Admitting: Physical Therapy

## 2019-03-10 DIAGNOSIS — G8929 Other chronic pain: Secondary | ICD-10-CM | POA: Diagnosis not present

## 2019-03-10 DIAGNOSIS — M542 Cervicalgia: Secondary | ICD-10-CM

## 2019-03-10 DIAGNOSIS — M6281 Muscle weakness (generalized): Secondary | ICD-10-CM | POA: Diagnosis not present

## 2019-03-10 DIAGNOSIS — M25512 Pain in left shoulder: Secondary | ICD-10-CM | POA: Diagnosis not present

## 2019-03-10 NOTE — Patient Instructions (Signed)
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
## 2019-03-14 ENCOUNTER — Ambulatory Visit: Payer: Medicare Other | Admitting: Physical Therapy

## 2019-03-16 ENCOUNTER — Ambulatory Visit: Payer: Medicare Other | Admitting: Physical Therapy

## 2019-03-21 ENCOUNTER — Ambulatory Visit: Payer: Medicare Other | Attending: Sports Medicine | Admitting: Physical Therapy

## 2019-03-21 ENCOUNTER — Other Ambulatory Visit: Payer: Self-pay

## 2019-03-21 DIAGNOSIS — G8929 Other chronic pain: Secondary | ICD-10-CM | POA: Insufficient documentation

## 2019-03-21 DIAGNOSIS — M25512 Pain in left shoulder: Secondary | ICD-10-CM | POA: Insufficient documentation

## 2019-03-21 DIAGNOSIS — M542 Cervicalgia: Secondary | ICD-10-CM | POA: Diagnosis not present

## 2019-03-21 DIAGNOSIS — M6281 Muscle weakness (generalized): Secondary | ICD-10-CM

## 2019-03-21 NOTE — Therapy (Signed)
[place], [place], [address] Phone: [phone]   Fax:  [phone]  Physical Therapy Treatment  Patient Details  Name: [name] [name] MRN: [id number] Date of Birth: 12/14/1945 Referring Provider (PT): Thurman Coyer, DO   Encounter Date: 03/21/2019  PT End of Session - 03/21/19 1104    Visit Number  4    Number of Visits  12    Date for PT Re-Evaluation  04/13/19    Authorization Type  [employer]HC MCR/MCD progress at 79, KX at 64    PT Start Time  1030    PT Stop Time  1117    PT Time Calculation (min)  47 min    Activity Tolerance  Patient tolerated treatment well    Behavior During Therapy  [facility] for tasks assessed/performed       Past Medical History:  Diagnosis Date  . Anemia   . Arthritis   . Blood transfusion without reported diagnosis   . CAD 08/26/2006   Cath 07/05:multiple areas of nonobstructive disease = medical & RF mgmt, well preserved global systolic function. Dr. Lia Foyer. Nuclear med stress test 01/2014 : Normal stress nuclear study. LV Ejection Fraction: 76%. LV Wall Motion: NL LV Function; NL Wall Motion.      . Cataract   . Chronic kidney disease   . Chronic pain syndrome 01/03/2013   Multifactorial. Non opioid requiring.   L2-5 fusion, with hardware at L2-3, stable per MRI 2010. Followed by neurosurgery (Dr. 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    [name] DEPRESSION 08/26/2006   On paxil    . DIABETES MELLITUS, TYPE II 08/26/2006   Insulin dependent. Victoza added 03/2014  . DYSLIPIDEMIA 11/01/2006   LDL goal < 100, 70 if can achieve without side effects.     . Gallstones   . GERD 08/26/2006   Heartburn QHS.     . Glaucoma   . HYPERTENSION 08/26/2006   On BB, ACEI, lasix, norvasc, metolazone, and imdur.     . OBESITY 08/26/2006   BMI 39.5. Obesity Class 2.     . PVD (peripheral vascular disease) ([place]) 03/27/2014   2015  LE arterial Duplex - Possible mild inflow disease on the left. 0-49% bilateral SFA disease, without focal stenosis. Three vesel run-off, bilaterally. Medical tx.     Past Surgical History:  Procedure Laterality Date  . CHOLECYSTECTOMY     pt denies 04/07/16  . endometrial biospy    . LEFT HEART CATH    . lumbar fusion surgery  10/08   L3-L5  . posterior lumbar interfusion surgery  07/18/07   L2-L3  . rotator cuff surgery Bilateral     There were no vitals filed for this visit.  Subjective Assessment - 03/21/19 1032    Subjective  Pt relays she is doing okay today with her neck and shoulder and nothing in paticular has been aggravating it any further than it already is. She relays it is a little easier to fix her hair now    Pertinent History  PMH: lumbar fusion 2008, chronic LBP, CAD,  Lt heart Cath, prev RTC repair 12 years ago, PVD, DM, HTN    Diagnostic tests  XR: Acromioclavicular and glenohumeral osteoarthritis, no acute, neck CT imaging negative    Patient Stated Goals  to get my arm and neck better, to fix my hair without pain     Currently in Pain?  No/denies    Pain Score  4     Pain Location  Shoulder   and neck   Pain Orientation  Left    Pain Descriptors / Indicators  Aching                       [facility] Adult PT Treatment/Exercise - 03/21/19 0001      Neck Exercises: Seated   Neck Retraction  20 reps    Cervical Rotation  10 reps;Both    Cervical Rotation Limitations  with towel AAROM SNAG      Shoulder Exercises: Standing   External Rotation  Strengthening;Left;15 reps    Theraband Level (Shoulder External Rotation)  Level 1 (Yellow)    Internal Rotation  Strengthening;Left;15 reps    Theraband Level (Shoulder Internal Rotation)  Level 2 (Red)    Flexion  AROM;15 reps;Both    Flexion Limitations  with her cane    Extension  Strengthening;Both;15 reps    Theraband Level (Shoulder Extension)  Level 2 (Red)    Row  Strengthening;Both;15 reps     Theraband Level (Shoulder Row)  Level 2 (Red)      Shoulder Exercises: Pulleys   Flexion  2 minutes    ABduction  1 minute      Modalities   Modalities  Electrical Stimulation;Moist Heat      Moist Heat Therapy   Number Minutes Moist Heat  15 Minutes    Moist Heat Location  Shoulder;Cervical      Electrical Stimulation   Electrical Stimulation Location  Lt neck and shoulder    Electrical Stimulation Action  IFC    Electrical Stimulation Parameters  to tolerance, pt sitting    Electrical Stimulation Goals  Tone;Pain      Manual Therapy   Manual therapy comments  STM in sitting to neck and Lt shoulder, PROM and manual stretching for UT and levator 10 sec X 5 ea                  PT Long Term Goals - 03/10/19 1015      PT LONG TERM GOAL #1   Title  Pt will be I and compliant with HEP. (Target goal for all goals 6 weeks 04/13/19)    Baseline  met with initial HEP, will continue to update prn pending progress      PT LONG TERM GOAL #2   Title  Pt will improve ROM in neck and Left shoulder to [facility] to improve mobility    Baseline  see objective, good progress with improved flexion and abduction, IR and ER reaching stil limited, neck not tested today      PT LONG TERM GOAL #3   Title  Pt will improve strength to at least 4/5 MMT to improve function    Baseline  MMTs not test today      PT LONG TERM GOAL #4   Title  Pt will improve FOTO to less than 42 % limited to show improved function    Baseline  not retested today, 58 at eval      PT LONG TERM GOAL #5   Title  Pt will reduce pain by overall 50%    Baseline  goal ongoing            Plan - 03/21/19 1104    Clinical Impression Statement  Pt showed some improvements in activity tolerance today and had no complaints with any exercises. She does require cues  for posture as in standing she perfers to hold her head down and lean fwd. PT will continue to progress as able toward her functional goals.      Stability/Clinical Decision Making  Evolving/Moderate complexity    Rehab Potential  Good    PT Frequency  Other (comment)   1-2x/week   PT Duration  6 weeks    PT Treatment/Interventions  ADLs/Self Care Home Management;Cryotherapy;Electrical Stimulation;Iontophoresis 52m/ml Dexamethasone;Moist Heat;Traction;Ultrasound;Therapeutic activities;Therapeutic exercise;Neuromuscular re-education;Manual techniques;Passive range of motion;Dry needling;Taping;Spinal Manipulations;Joint Manipulations    PT Next Visit Plan  add further strengthening as tolerated pending pain, continue manual work with STM, check response dry needling, modalties as needed for pain    PT Home Exercise Plan  chin tucks supine, cerv rotation with towel, scap retraction, supine wand flexion, UT stretch    Consulted and Agree with Plan of Care  Patient       Patient will benefit from skilled therapeutic intervention in order to improve the following deficits and impairments:  Decreased endurance, Decreased activity tolerance, Decreased range of motion, Decreased strength, Hypomobility, Impaired flexibility, Increased muscle spasms, Postural dysfunction, Pain  Visit Diagnosis: Chronic left shoulder pain  Cervicalgia  Muscle weakness (generalized)     Problem List Patient Active Problem List   Diagnosis Date Noted  . Ankle swelling, left 11/03/2018  . Esophageal dysphagia 03/03/2018  . Gait instability 03/03/2018  . Aortic atherosclerosis ([place] 06/17/2017  . 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 Right carotid bruit 04/08/2017  . CKD stage 3 due to type 2 diabetes mellitus ([place] 12/11/2016  . Diabetic neuropathy ([place] 12/10/2016  . Small intestinal bacterial overgrowth 12/24/2015  . Goals of care, counseling/discussion 01/18/2015  . ASCUS with positive high risk HPV 08/03/2014  . PVD (peripheral vascular disease) ([place] 03/27/2014  . Diastolic CHF, chronic ([place] 12/21/2013  . Healthcare maintenance  04/25/2013  . Chronic pain syndrome 01/03/2013  . Insomnia 12/22/2011  . Vitamin B12 deficiency 12/27/2009  . Vitamin D deficiency 12/27/2009  . Anemia 12/27/2009  . Hypomagnesemia 12/29/2008  . Osteopenia 08/01/2008  . Hyperlipidemia 11/01/2006  . GLAUCOMA NOS 11/01/2006  . DM (diabetes mellitus), type 2, uncontrolled, periph vascular complic ([place] 125/63/8937 . Obesity 08/26/2006  . Depression 08/26/2006  . HTN (hypertension) 08/26/2006  . Coronary atherosclerosis 08/26/2006  . GERD 08/26/2006    BSilvestre Mesi6/06/2019, 11:08 AM  [facility] [address]Phone: [phone]  Fax:  [phone] Name: [name] VARONEMRN: [id number] of Birth: 631-Mar-1947

## 2019-03-23 ENCOUNTER — Other Ambulatory Visit: Payer: Self-pay

## 2019-03-23 ENCOUNTER — Ambulatory Visit: Payer: Medicare Other | Admitting: Physical Therapy

## 2019-03-23 DIAGNOSIS — M542 Cervicalgia: Secondary | ICD-10-CM | POA: Diagnosis not present

## 2019-03-23 DIAGNOSIS — M25512 Pain in left shoulder: Secondary | ICD-10-CM | POA: Diagnosis not present

## 2019-03-23 DIAGNOSIS — M6281 Muscle weakness (generalized): Secondary | ICD-10-CM

## 2019-03-23 DIAGNOSIS — G8929 Other chronic pain: Secondary | ICD-10-CM | POA: Diagnosis not present

## 2019-03-23 NOTE — Therapy (Signed)
[facility] [address] [place], [place], [address] Phone: [phone]   Fax:  [phone]  Physical Therapy Treatment  Patient Details  Name: [name] [name] MRN: [id number] Date of Birth: 06/20/1946 Referring Provider (PT): Draper, Timothy R, DO   Encounter Date: 03/23/2019  PT End of Session - 03/23/19 1108    Visit Number  5    Number of Visits  12    Date for PT Re-Evaluation  04/13/19    Authorization Type  UHC MCR/MCD progress at 10, KX at 15    PT Start Time  1030    PT Stop Time  1115    PT Time Calculation (min)  45 min    Activity Tolerance  Patient tolerated treatment well    Behavior During Therapy  WFL for tasks assessed/performed       Past Medical History:  Diagnosis Date  . Anemia   . Arthritis   . Blood transfusion without reported diagnosis   . CAD 08/26/2006   Cath 07/05:multiple areas of nonobstructive disease = medical & RF mgmt, well preserved global systolic function. Dr. Stuckey. Nuclear med stress test 01/2014 : Normal stress nuclear study. LV Ejection Fraction: 76%. LV Wall Motion: NL LV Function; NL Wall Motion.      . Cataract   . Chronic kidney disease   . Chronic pain syndrome 01/03/2013   Multifactorial. Non opioid requiring.   L2-5 fusion, with hardware at L2-3, stable per MRI 2010. Followed by neurosurgery (Dr. Jones) Cervical MRI 2010 : Disc herniation at C6-7 L>R; possible compression/irritation C8 nerve root L>R.    . DEPRESSION 08/26/2006   On paxil    . DIABETES MELLITUS, TYPE II 08/26/2006   Insulin dependent. Victoza added 03/2014  . DYSLIPIDEMIA 11/01/2006   LDL goal < 100, 70 if can achieve without side effects.     . Gallstones   . GERD 08/26/2006   Heartburn QHS.     . Glaucoma   . HYPERTENSION 08/26/2006   On BB, ACEI, lasix, norvasc, metolazone, and imdur.     . OBESITY 08/26/2006   BMI 39.5. Obesity Class 2.     . PVD (peripheral vascular disease) ([facility]) 03/27/2014   2015  LE arterial Duplex - Possible mild inflow disease on the left. 0-49% bilateral SFA disease, without focal stenosis. Three vesel run-off, bilaterally. Medical tx.     Past Surgical History:  Procedure Laterality Date  . CHOLECYSTECTOMY     pt denies 04/07/16  . endometrial biospy    . LEFT HEART CATH    . lumbar fusion surgery  10/08   L3-L5  . posterior lumbar interfusion surgery  07/18/07   L2-L3  . rotator cuff surgery Bilateral     There were no vitals filed for this visit.  Subjective Assessment - 03/23/19 1038    Subjective  Pt relays her back is really bothering her but not sure why. Her neck and shoulder are still hurting but they feel a little better after last session.    Pain Score  8     Pain Location  Neck   and Lt shoulder, and back   Pain Orientation  Left    Pain Descriptors / Indicators  Aching    Pain Type  Chronic pain    Pain Radiating Towards  Lt shoulder                       [facility] Adult PT Treatment/Exercise -   03/23/19 0001      Neck Exercises: Seated   Neck Retraction  20 reps    Cervical Rotation  10 reps;Both    Cervical Rotation Limitations  with towel AAROM SNAG    Other Seated Exercise  cerv ext SNAG with towel      Shoulder Exercises: Seated   Other Seated Exercises  cane flexion 20 reps      Shoulder Exercises: Standing   External Rotation  Strengthening;Left;20 reps    Theraband Level (Shoulder External Rotation)  Level 1 (Yellow)    Internal Rotation  Strengthening;Left;20 reps    Theraband Level (Shoulder Internal Rotation)  Level 2 (Red)    Extension  Strengthening;Both;20 reps    Theraband Level (Shoulder Extension)  Level 2 (Red)    Row  Strengthening;Both;20 reps    Theraband Level (Shoulder Row)  Level 2 (Red)      Shoulder Exercises: Pulleys   Flexion  2 minutes    ABduction  2 minutes      Modalities   Modalities  Electrical Stimulation;Moist Heat      Moist Heat Therapy   Number Minutes Moist Heat  15  Minutes    Moist Heat Location  Shoulder;Cervical      Electrical Stimulation   Electrical Stimulation Location  Lt neck and shoulder    Electrical Stimulation Action  IFC    Electrical Stimulation Parameters  tolerance    Electrical Stimulation Goals  Tone;Pain      Manual Therapy   Manual therapy comments  STM in sitting to neck and Lt shoulder, PROM and manual stretching for UT and levator 10 sec X 5 ea      Neck Exercises: Stretches   Upper Trapezius Stretch  Right;Left;2 reps;30 seconds                  PT Long Term Goals - 03/10/19 1015      PT LONG TERM GOAL #1   Title  Pt will be I and compliant with HEP. (Target goal for all goals 6 weeks 04/13/19)    Baseline  met with initial HEP, will continue to update prn pending progress      PT LONG TERM GOAL #2   Title  Pt will improve ROM in neck and Left shoulder to [facility] to improve mobility    Baseline  see objective, good progress with improved flexion and abduction, IR and ER reaching stil limited, neck not tested today      PT LONG TERM GOAL #3   Title  Pt will improve strength to at least 4/5 MMT to improve function    Baseline  MMTs not test today      PT LONG TERM GOAL #4   Title  Pt will improve FOTO to less than 42 % limited to show improved function    Baseline  not retested today, 58 at eval      PT LONG TERM GOAL #5   Title  Pt will reduce pain by overall 50%    Baseline  goal ongoing            Plan - 03/23/19 1105    Clinical Impression Statement  Pt showed some improvements today in ROM and was able to slightly progress her strength program with good tolerance despite high pain levels reported at beginning of session. PT will progress as tolerated improving her function.    Stability/Clinical Decision Making  Evolving/Moderate complexity    Rehab Potential  Good  PT Frequency  Other (comment)   1-2x/week   PT Duration  6 weeks    PT Treatment/Interventions  ADLs/Self Care Home  Management;Cryotherapy;Electrical Stimulation;Iontophoresis 52m/ml Dexamethasone;Moist Heat;Traction;Ultrasound;Therapeutic activities;Therapeutic exercise;Neuromuscular re-education;Manual techniques;Passive range of motion;Dry needling;Taping;Spinal Manipulations;Joint Manipulations    PT Next Visit Plan  add further strengthening as tolerated pending pain, continue manual work with STM, check response dry needling, modalties as needed for pain    PT Home Exercise Plan  chin tucks supine, cerv rotation with towel, scap retraction, supine wand flexion, UT stretch    Consulted and Agree with Plan of Care  Patient       Patient will benefit from skilled therapeutic intervention in order to improve the following deficits and impairments:  Decreased endurance, Decreased activity tolerance, Decreased range of motion, Decreased strength, Hypomobility, Impaired flexibility, Increased muscle spasms, Postural dysfunction, Pain  Visit Diagnosis: Chronic left shoulder pain  Cervicalgia  Muscle weakness (generalized)     Problem List Patient Active Problem List   Diagnosis Date Noted  . Ankle swelling, left 11/03/2018  . Esophageal dysphagia 03/03/2018  . Gait instability 03/03/2018  . Aortic atherosclerosis ([facility] 06/17/2017  . 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 Right carotid bruit 04/08/2017  . CKD stage 3 due to type 2 diabetes mellitus ([place] 12/11/2016  . Diabetic neuropathy ([facility] 12/10/2016  . Small intestinal bacterial overgrowth 12/24/2015  . Goals of care, counseling/discussion 01/18/2015  . ASCUS with positive high risk HPV 08/03/2014  . PVD (peripheral vascular disease) ([place] 03/27/2014  . Diastolic CHF, chronic ([facility] 12/21/2013  . Healthcare maintenance 04/25/2013  . Chronic pain syndrome 01/03/2013  . Insomnia 12/22/2011  . Vitamin B12 deficiency 12/27/2009  . Vitamin D deficiency 12/27/2009  . Anemia 12/27/2009  . Hypomagnesemia 12/29/2008  . Osteopenia 08/01/2008   . Hyperlipidemia 11/01/2006  . GLAUCOMA NOS 11/01/2006  . DM (diabetes mellitus), type 2, uncontrolled, periph vascular complic ([place] 138/93/7342 . Obesity 08/26/2006  . Depression 08/26/2006  . HTN (hypertension) 08/26/2006  . Coronary atherosclerosis 08/26/2006  . GERD 08/26/2006    BSilvestre Mesi6/08/2019, 11:08 AM  [facility] AvenueGOcean City [place] [address]Phone: [phone]  Fax:  [phone] Name: [name] PEBLEYMRN: [id number] of Birth: 607/29/1947

## 2019-03-28 ENCOUNTER — Other Ambulatory Visit: Payer: Self-pay

## 2019-03-28 ENCOUNTER — Ambulatory Visit: Payer: Medicare Other | Admitting: Physical Therapy

## 2019-03-28 DIAGNOSIS — M542 Cervicalgia: Secondary | ICD-10-CM | POA: Diagnosis not present

## 2019-03-28 DIAGNOSIS — M6281 Muscle weakness (generalized): Secondary | ICD-10-CM | POA: Diagnosis not present

## 2019-03-28 DIAGNOSIS — G8929 Other chronic pain: Secondary | ICD-10-CM | POA: Diagnosis not present

## 2019-03-28 DIAGNOSIS — M25512 Pain in left shoulder: Secondary | ICD-10-CM
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
## 2019-03-29 ENCOUNTER — Other Ambulatory Visit: Payer: Self-pay | Admitting: Internal Medicine

## 2019-03-30 ENCOUNTER — Other Ambulatory Visit: Payer: Self-pay

## 2019-03-30 ENCOUNTER — Ambulatory Visit: Payer: Medicare Other | Admitting: Physical Therapy

## 2019-03-30 VITALS — BP 147/70 | HR 85

## 2019-03-30 DIAGNOSIS — M542 Cervicalgia: Secondary | ICD-10-CM | POA: Diagnosis not present

## 2019-03-30 DIAGNOSIS — G8929 Other chronic pain: Secondary | ICD-10-CM | POA: Diagnosis not present

## 2019-03-30 DIAGNOSIS — M6281 Muscle weakness (generalized): Secondary | ICD-10-CM | POA: Diagnosis not present

## 2019-03-30 DIAGNOSIS — M25512 Pain in left shoulder: Secondary | ICD-10-CM | POA: Diagnosis not present

## 2019-03-30 NOTE — Therapy (Signed)
[place], [place], [address] Phone: [phone]   Fax:  [phone]  Physical Therapy Treatment  Patient Details  Name: [name] [name] MRN: [id number] Date of Birth: Jun 29, 1946 Referring Provider (PT): Thurman Coyer, DO   Encounter Date: 03/30/2019  PT End of Session - 03/30/19 1147    Visit Number  7    Number of Visits  12    Date for PT Re-Evaluation  04/13/19    Authorization Type  UHC MCR/MCD progress at 32, KX at 15    PT Start Time  1100    PT Stop Time  1150    PT Time Calculation (min)  50 min    Activity Tolerance  Patient tolerated treatment well    Behavior During Therapy  [facility] for tasks assessed/performed       Past Medical History:  Diagnosis Date  . Anemia   . Arthritis   . Blood transfusion without reported diagnosis   . CAD 08/26/2006   Cath 07/05:multiple areas of nonobstructive disease = medical & RF mgmt, well preserved global systolic function. Dr. Lia Foyer. Nuclear med stress test 01/2014 : Normal stress nuclear study. LV Ejection Fraction: 76%. LV Wall Motion: NL LV Function; NL Wall Motion.      . Cataract   . Chronic kidney disease   . Chronic pain syndrome 01/03/2013   Multifactorial. Non opioid requiring.   L2-5 fusion, with hardware at L2-3, stable per MRI 2010. Followed by neurosurgery (Dr. 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    [name] DEPRESSION 08/26/2006   On paxil    . DIABETES MELLITUS, TYPE II 08/26/2006   Insulin dependent. Victoza added 03/2014  . DYSLIPIDEMIA 11/01/2006   LDL goal < 100, 70 if can achieve without side effects.     . Gallstones   . GERD 08/26/2006   Heartburn QHS.     . Glaucoma   . HYPERTENSION 08/26/2006   On BB, ACEI, lasix, norvasc, metolazone, and imdur.     . OBESITY 08/26/2006   BMI 39.5. Obesity Class 2.     . PVD (peripheral vascular disease) ([place]) 03/27/2014   2015  LE arterial Duplex - Possible mild inflow disease on the left. 0-49% bilateral SFA disease, without focal stenosis. Three vesel run-off, bilaterally. Medical tx.     Past Surgical History:  Procedure Laterality Date  . CHOLECYSTECTOMY     pt denies 04/07/16  . endometrial biospy    . LEFT HEART CATH    . lumbar fusion surgery  10/08   L3-L5  . posterior lumbar interfusion surgery  07/18/07   L2-L3  . rotator cuff surgery Bilateral     Vitals:   03/30/19 1121  BP: (!) 147/70  Pulse: 85  SpO2: 96%    Subjective Assessment - 03/30/19 1121    Subjective  Pt reports her back and hips are really bothering her, then neck is doing okay    Currently in Pain?  Yes    Pain Score  8     Pain Location  Back    Pain Orientation  Right;Left;Lower    Pain Descriptors / Indicators  Aching    Pain Type  Chronic pain                       [facility] Adult PT Treatment/Exercise - 03/30/19 0001      Exercises  Exercises  Neck;Shoulder;Lumbar      Neck Exercises: Machines for Strengthening   UBE (Upper Arm Bike)  2 min fwd, 2 min retro, no resistance      Neck Exercises: Seated   Neck Retraction  20 reps      Lumbar Exercises: Stretches   Single Knee to Chest Stretch  Right;Left;2 reps;30 seconds    Lower Trunk Rotation  5 reps;10 seconds      Lumbar Exercises: Supine   Clam  20 reps    Clam Limitations  red      Shoulder Exercises: Seated   Other Seated Exercises  cane flexion and abd AAROM 20 reps      Shoulder Exercises: Standing   External Rotation  Strengthening;Left;20 reps    Theraband Level (Shoulder External Rotation)  Level 2 (Red)    Extension  Strengthening;Both;20 reps    Theraband Level (Shoulder Extension)  Level 2 (Red)      Shoulder Exercises: Pulleys   Flexion  2 minutes    ABduction  2 minutes      Modalities   Modalities  Electrical Stimulation;Moist Heat      Moist Heat Therapy   Number Minutes Moist Heat  15 Minutes    Moist Heat  Location  Shoulder;Cervical      Electrical Stimulation   Electrical Stimulation Location  neck and lumbar    Electrical Stimulation Action  Pre mod    Electrical Stimulation Parameters  tolerance    Electrical Stimulation Goals  Tone;Pain      Manual Therapy   Manual therapy comments  long axis distraction to lumbar      Neck Exercises: Stretches   Upper Trapezius Stretch  Right;Left;2 reps;30 seconds                  PT Long Term Goals - 03/10/19 1015      PT LONG TERM GOAL #1   Title  Pt will be I and compliant with HEP. (Target goal for all goals 6 weeks 04/13/19)    Baseline  met with initial HEP, will continue to update prn pending progress      PT LONG TERM GOAL #2   Title  Pt will improve ROM in neck and Left shoulder to [facility] to improve mobility    Baseline  see objective, good progress with improved flexion and abduction, IR and ER reaching stil limited, neck not tested today      PT LONG TERM GOAL #3   Title  Pt will improve strength to at least 4/5 MMT to improve function    Baseline  MMTs not test today      PT LONG TERM GOAL #4   Title  Pt will improve FOTO to less than 42 % limited to show improved function    Baseline  not retested today, 58 at eval      PT LONG TERM GOAL #5   Title  Pt will reduce pain by overall 50%    Baseline  goal ongoing            Plan - 03/30/19 1147    Clinical Impression Statement  Pt had more lumbar pain today so incorportated some gentle stretches, strengthening, and MT for this. Continued neck and shoulder stretching and strengthening. She did great with her session then after session as she was walking out of the treatment area she tripped and fell in the hallway causing her to hit the floor on her chest, belly, and  Lt knee. She was able to decrease the force of the fall with her hands and did not appear to hit her head. Her vitals were taken and were stable and listed above. She was not in any distress and denied any  injury, denies hitting, head, and denies any dizziness or pain other than minimal knee pain. She was assisted up to a wheelchair +2 and she felt fine upon sitting. She declined EMS and PT does not feel this is necessary at this time as she was able to stand and ambulate independently with only minimal knee pain. She was instructed to call PT if she begins to have any pain or symptoms following this witnessed fall. PT did fill out incident report regarding this. PT will call to check on patient at the end of the day.    Stability/Clinical Decision Making  Evolving/Moderate complexity    Rehab Potential  Good    PT Frequency  Other (comment)   1-2x/week   PT Duration  6 weeks    PT Treatment/Interventions  ADLs/Self Care Home Management;Cryotherapy;Electrical Stimulation;Iontophoresis 46m/ml Dexamethasone;Moist Heat;Traction;Ultrasound;Therapeutic activities;Therapeutic exercise;Neuromuscular re-education;Manual techniques;Passive range of motion;Dry needling;Taping;Spinal Manipulations;Joint Manipulations    PT Next Visit Plan  add further strengthening as tolerated pending pain, continue manual work with STM, check response dry needling, modalties as needed for pain    PT Home Exercise Plan  chin tucks supine, cerv rotation with towel, scap retraction, supine wand flexion, UT stretch    Consulted and Agree with Plan of Care  Patient       Patient will benefit from skilled therapeutic intervention in order to improve the following deficits and impairments:  Decreased endurance, Decreased activity tolerance, Decreased range of motion, Decreased strength, Hypomobility, Impaired flexibility, Increased muscle spasms, Postural dysfunction, Pain  Visit Diagnosis: 1. Chronic left shoulder pain   2. Cervicalgia   3. Muscle weakness (generalized)        Problem List Patient Active Problem List   Diagnosis Date Noted  . Ankle swelling, left 11/03/2018  . Esophageal dysphagia 03/03/2018  . Gait  instability 03/03/2018  . Aortic atherosclerosis ([place] 06/17/2017  . 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 Right carotid bruit 04/08/2017  . CKD stage 3 due to type 2 diabetes mellitus (HP[place] 12/11/2016  . Diabetic neuropathy (H[place] 12/10/2016  . Small intestinal bacterial overgrowth 12/24/2015  . Goals of care, counseling/discussion 01/18/2015  . ASCUS with positive high risk HPV 08/03/2014  . PVD (peripheral vascular disease) (HBrim[place] 03/27/2014  . Diastolic CHF, chronic (HL[place] 12/21/2013  . Healthcare maintenance 04/25/2013  . Chronic pain syndrome 01/03/2013  . Insomnia 12/22/2011  . Vitamin B12 deficiency 12/27/2009  . Vitamin D deficiency 12/27/2009  . Anemia 12/27/2009  . Hypomagnesemia 12/29/2008  . Osteopenia 08/01/2008  . Hyperlipidemia 11/01/2006  . GLAUCOMA NOS 11/01/2006  . DM (diabetes mellitus), type 2, uncontrolled, periph vascular complic ([place] 158/85/0277 . Obesity 08/26/2006  . Depression 08/26/2006  . HTN (hypertension) 08/26/2006  . Coronary atherosclerosis 08/26/2006  . GERD 08/26/2006    BSilvestre Mesi6/18/2020, 12:36 PM  [facility] [address]Phone: [phone]  Fax:  [phone] Name: RCLARABEL MARIONMRN: [id number] of Birth: [birth year]

## 2019-03-31 ENCOUNTER — Telehealth: Payer: Self-pay | Admitting: Physical Therapy

## 2019-03-31 NOTE — Telephone Encounter (Signed)
Called patient to check on her as she tripped and fell in our PT clinic yesterday. She relays she feels sore but denies any injury and states she will be okay.   Elsie Ra, PT, DPT 03/31/19 10:22 AM

## 2019-04-03 ENCOUNTER — Other Ambulatory Visit: Payer: Self-pay

## 2019-04-03 ENCOUNTER — Ambulatory Visit: Payer: Medicare Other | Admitting: *Deleted

## 2019-04-03 VITALS — Ht 66.0 in | Wt 215.0 lb

## 2019-04-03 DIAGNOSIS — Z86010 Personal history of colonic polyps: Secondary | ICD-10-CM

## 2019-04-03 MED ORDER — PLENVU 140 G PO SOLR: 1.0000 | ORAL | 0 refills | Status: DC

## 2019-04-03 NOTE — Progress Notes (Signed)
No egg or soy allergy known to patient  No issues with past sedation with any surgeries  or procedures, no intubation problems  No diet pills per patient No home 02 use per patient  No blood thinners per patient  Pt denies issues with constipation  No A fib or A flutter  EMMI video sent to pt's e mail   Pt verified name, DOB, address and insurance during PV today. Pt mailed instruction packet to included paper to complete and mail back to [facility] with addressed and stamped envelope, Emmi video, copy of consent form to read and not return, and instructions.  PV completed over the phone. Pt encouraged to call with questions or issues  Plenvu sample- Pt to pick up Tuesday between 8-5 on 3rd floor- wear a mask pt aware  Plenvu lot [phone] exp 01/2020  Pt is aware that care partner will wait in the car during parking lot; if they feel like they will be too hot to wait in the car; they may wait in the lobby.  We want them to wear a mask (we do not have any that we can provide them), practice social distancing, and we will check their temperatures when they get here.  I did remind patient that their care partner needs to stay in the parking lot the entire time. Pt will wear mask into building

## 2019-04-04 NOTE — Progress Notes (Signed)
This encounter was created in error - please disregard.

## 2019-04-10 ENCOUNTER — Telehealth: Payer: Self-pay | Admitting: Gastroenterology

## 2019-04-10 NOTE — Telephone Encounter (Signed)
Spoke with patient regarding Covid-19 screening questions °Covid-19 Screening Questions ° °Do you now or have you had a fever in the last 14 days?     No ° °Do you have any respiratory symptoms of shortness of breath or cough now or in the last 14 days?    No ° °Do you have any family members or close contacts with diagnosed or suspected Covid-19 in the past 14 days?     No ° °Have you been tested for Covid-19 and found to be positive?    No ° °Pt made aware of that care partner may wait in the car or come up to the lobby during the procedure but will need to provide their own mask. ° °  °

## 2019-04-11 ENCOUNTER — Encounter: Payer: Self-pay | Admitting: Gastroenterology

## 2019-04-11 ENCOUNTER — Ambulatory Visit (AMBULATORY_SURGERY_CENTER): Payer: Medicare Other | Admitting: Gastroenterology

## 2019-04-11 ENCOUNTER — Other Ambulatory Visit: Payer: Self-pay

## 2019-04-11 VITALS — BP 127/67 | HR 87 | Temp 98.0°F | Resp 14 | Ht 66.0 in | Wt 232.0 lb

## 2019-04-11 DIAGNOSIS — D122 Benign neoplasm of ascending colon: Secondary | ICD-10-CM | POA: Diagnosis not present

## 2019-04-11 DIAGNOSIS — D120 Benign neoplasm of cecum: Secondary | ICD-10-CM

## 2019-04-11 DIAGNOSIS — K5900 Constipation, unspecified: Secondary | ICD-10-CM

## 2019-04-11 DIAGNOSIS — Z86010 Personal history of colonic polyps: Secondary | ICD-10-CM | POA: Diagnosis not present

## 2019-04-11 DIAGNOSIS — Z1211 Encounter for screening for malignant neoplasm of colon: Secondary | ICD-10-CM | POA: Diagnosis not present

## 2019-04-11 MED ORDER — SODIUM CHLORIDE 0.9 % IV SOLN: 500.0000 mL | Freq: Once | INTRAVENOUS | Status: DC

## 2019-04-11 NOTE — Patient Instructions (Signed)
3 polyps removed Diverticulosis and hemorrhoids noted Please note 24/7 emergency number circled in red below  YOU HAD AN ENDOSCOPIC PROCEDURE TODAY AT [place]:   Refer to the procedure report that was given to you for any specific questions about what was found during the examination.  If the procedure report does not answer your questions, please call your gastroenterologist to clarify.  If you requested that your care partner not be given the details of your procedure findings, then the procedure report has been included in a sealed envelope for you to review at your convenience later.  YOU SHOULD EXPECT: Some feelings of bloating in the abdomen. Passage of more gas than usual.  Walking can help get rid of the air that was put into your GI tract during the procedure and reduce the bloating. If you had a lower endoscopy (such as a colonoscopy or flexible sigmoidoscopy) you may notice spotting of blood in your stool or on the toilet paper. If you underwent a bowel prep for your procedure, you may not have a normal bowel movement for a few days.  Please Note:  You might notice some irritation and congestion in your nose or some drainage.  This is from the oxygen used during your procedure.  There is no need for concern and it should clear up in a day or so.  SYMPTOMS TO REPORT IMMEDIATELY:   Following lower endoscopy (colonoscopy or flexible sigmoidoscopy):  Excessive amounts of blood in the stool  Significant tenderness or worsening of abdominal pains  Swelling of the abdomen that is new, acute  Fever of 100F or higher   For urgent or emergent issues, a gastroenterologist can be reached at any hour by calling [phone].   DIET:  We do recommend a small meal at first, but then you may proceed to your regular diet.  Drink plenty of fluids but you should avoid alcoholic beverages for 24 hours.  ACTIVITY:  You should plan to take it easy for the rest of today and you  should NOT DRIVE or use heavy machinery until tomorrow (because of the sedation medicines used during the test).    FOLLOW UP: Our staff will call the number listed on your records 48-72 hours following your procedure to check on you and address any questions or concerns that you may have regarding the information given to you following your procedure. If we do not reach you, we will leave a message.  We will attempt to reach you two times.  During this call, we will ask if you have developed any symptoms of COVID 19. If you develop any symptoms (ie: fever, flu-like symptoms, shortness of breath, cough etc.) before then, please call [phone].  If you test positive for Covid 19 in the 2 weeks post procedure, please call and report this information to [place].    If any biopsies were taken you will be contacted by phone or by letter within the next 1-3 weeks.  Please call us at [phone] if you have not heard about the biopsies in 3 weeks.    SIGNATURES/CONFIDENTIALITY: You and/or your care partner have signed paperwork which will be entered into your electronic medical record.  These signatures attest to the fact that that the information above on your After Visit Summary has been reviewed and is understood.  Full responsibility of the confidentiality of this discharge information lies with you and/or your care-partner.

## 2019-04-11 NOTE — Progress Notes (Signed)
A/ox3, pleased with MAC, report to RN 

## 2019-04-11 NOTE — Progress Notes (Signed)
Pt's states no medical or surgical changes since previsit or office visit.  Temps Lia or June Vitals JB

## 2019-04-11 NOTE — Progress Notes (Signed)
Called to room to assist during endoscopic procedure.  Patient ID and intended procedure confirmed with present staff. Received instructions for my participation in the procedure from the performing physician.  

## 2019-04-11 NOTE — Op Note (Signed)
[facility] Patient Name: [name] [name] Procedure Date: 04/11/2019 10:38 AM MRN: [id number] Endoscopist: Remo Lipps P. Havery Moros , MD Age: 73 Referring MD:  Date of Birth: [birth year] Gender: Female Account #: [id number] Procedure:                Colonoscopy Indications:              Screening for colorectal malignant neoplasm (double                            prep - last exam limited by poor prep) Medicines:                Monitored Anesthesia Care Procedure:                Pre-Anesthesia Assessment:                           - Prior to the procedure, a History and Physical                            was performed, and patient medications and                            allergies were reviewed. The patient's tolerance of                            previous anesthesia was also reviewed. The risks                            and benefits of the procedure and the sedation                            options and risks were discussed with the patient.                            All questions were answered, and informed consent                            was obtained. Prior Anticoagulants: The patient has                            taken no previous anticoagulant or antiplatelet                            agents. ASA Grade Assessment: II - A patient with                            mild systemic disease. After reviewing the risks                            and benefits, the patient was deemed in                            satisfactory condition to undergo the procedure.  After obtaining informed consent, the colonoscope                            was passed under direct vision. Throughout the                            procedure, the patient's blood pressure, pulse, and                            oxygen saturations were monitored continuously. The                            Colonoscope was introduced through the anus and                            advanced to the  the cecum, identified by                            appendiceal orifice and ileocecal valve. The                            colonoscopy was performed without difficulty. The                            patient tolerated the procedure well. The quality                            of the bowel preparation was good. The ileocecal                            valve, appendiceal orifice, and rectum were                            photographed. Scope In: 10:46:24 AM Scope Out: 11:05:50 AM Scope Withdrawal Time: 0 hours 13 minutes 58 seconds  Total Procedure Duration: 0 hours 19 minutes 26 seconds  Findings:                 The perianal and digital rectal examinations were                            normal.                           A diminutive polyp was found in the cecum. The                            polyp was sessile. The polyp was removed with a                            cold biopsy forceps. Resection and retrieval were                            complete.  Two flat polyps were found in the ascending colon.                            The polyps were 4 to 5 mm in size. These polyps                            were removed with a cold snare. Resection and                            retrieval were complete.                           Scattered medium-mouthed diverticula were found in                            the transverse colon and left colon.                           Internal hemorrhoids were found during retroflexion.                           The exam was otherwise without abnormality. Complications:            No immediate complications. Estimated blood loss:                            Minimal. Estimated Blood Loss:     Estimated blood loss was minimal. Impression:               - One diminutive polyp in the cecum, removed with a                            cold biopsy forceps. Resected and retrieved.                           - Two 4 to 5 mm polyps in the  ascending colon,                            removed with a cold snare. Resected and retrieved.                           - Diverticulosis in the transverse colon and in the                            left colon.                           - Internal hemorrhoids.                           - The examination was otherwise normal. Recommendation:           - Patient has a contact number available for  emergencies. The signs and symptoms of potential                            delayed complications were discussed with the                            patient. Return to normal activities tomorrow.                            Written discharge instructions were provided to the                            patient.                           - Resume previous diet.                           - Continue present medications.                           - Await pathology results. Remo Lipps P. Maple Odaniel, MD 04/11/2019 11:09:39 AM This report has been signed electronically.

## 2019-04-13 ENCOUNTER — Telehealth: Payer: Self-pay

## 2019-04-13 NOTE — Telephone Encounter (Signed)
  Follow up Call-  Call back number 04/11/2019 02/28/2019 06/02/2018  Post procedure Call Back phone  # [phone] cell number ok to speak to her. [phone]  Permission to leave phone message Yes Yes Yes  Some recent data might be hidden     Patient questions:  Do you have a fever, pain , or abdominal swelling? No. Pain Score  0 *  Have you tolerated food without any problems? Yes.    Have you been able to return to your normal activities? Yes.    Do you have any questions about your discharge instructions: Diet   No. Medications  No. Follow up visit  No.  Do you have questions or concerns about your Care? No.  Actions: * If pain score is 4 or above: No action needed, pain <4.  1. Have you developed a fever since your procedure? no  2.   Have you had an respiratory symptoms (SOB or cough) since your procedure? no  3.   Have you tested positive for COVID 19 since your procedure no  4.   Have you had any family members/close contacts diagnosed with the COVID 19 since your procedure?  no   If yes to any of these questions please route to Joylene John, RN and Alphonsa Gin, Therapist, sports.

## 2019-04-18 ENCOUNTER — Other Ambulatory Visit: Payer: Self-pay | Admitting: *Deleted

## 2019-04-18 NOTE — Patient Outreach (Addendum)
[facility]) Care Management  04/18/2019  AUSTRALIA DROLL 1946/08/04 [id number]   [place] Quarterly Outreach  Referral Date:05/09/2018 Referral Source:Self referral Reason for Referral:"Physician requested her to contact [facility]" [place] [employer]   Addendum:   Received telephone call back from patient.  States she is not able to talk right now and requesting telephone call back at later date and time.  RN Health Coach will make another telephone outreach to patient within the month of August.   Outreach Attempt:  Outreach attempt #1 to patient for follow up. No answer and unable to leave voicemail message due to voicemail box is full.  Plan:  RN Health Coach will make another outreach attempt within the month of August if no return call back from patient.  [place] [phone] [email or web address]

## 2019-04-20 ENCOUNTER — Ambulatory Visit: Payer: Medicare Other | Attending: Sports Medicine | Admitting: Physical Therapy

## 2019-04-20 ENCOUNTER — Other Ambulatory Visit: Payer: Self-pay

## 2019-04-20 ENCOUNTER — Encounter: Payer: Self-pay | Admitting: Gastroenterology

## 2019-04-20 DIAGNOSIS — M6281 Muscle weakness (generalized): Secondary | ICD-10-CM | POA: Diagnosis not present

## 2019-04-20 DIAGNOSIS — M542 Cervicalgia: Secondary | ICD-10-CM | POA: Diagnosis not present

## 2019-04-20 DIAGNOSIS — G8929 Other chronic pain: Secondary | ICD-10-CM

## 2019-04-20 DIAGNOSIS — M25512 Pain in left shoulder: Secondary | ICD-10-CM | POA: Insufficient documentation
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
## 2019-04-26 ENCOUNTER — Other Ambulatory Visit: Payer: Self-pay

## 2019-04-26 ENCOUNTER — Other Ambulatory Visit: Payer: Self-pay | Admitting: Internal Medicine

## 2019-04-26 ENCOUNTER — Ambulatory Visit: Payer: Medicare Other | Admitting: Physical Therapy

## 2019-04-26 DIAGNOSIS — G8929 Other chronic pain: Secondary | ICD-10-CM | POA: Diagnosis not present

## 2019-04-26 DIAGNOSIS — M25512 Pain in left shoulder: Secondary | ICD-10-CM

## 2019-04-26 DIAGNOSIS — M542 Cervicalgia: Secondary | ICD-10-CM | POA: Diagnosis not present

## 2019-04-26 DIAGNOSIS — E1151 Type 2 diabetes mellitus with diabetic peripheral angiopathy without gangrene: Secondary | ICD-10-CM

## 2019-04-26 DIAGNOSIS — M6281 Muscle weakness (generalized): Secondary | ICD-10-CM

## 2019-04-26 DIAGNOSIS — IMO0002 Reserved for concepts with insufficient information to code with codable children: Secondary | ICD-10-CM

## 2019-04-26 DIAGNOSIS — E1165 Type 2 diabetes mellitus with hyperglycemia: Secondary | ICD-10-CM

## 2019-04-26 NOTE — Therapy (Signed)
[place], [place], [address] Phone: [phone]   Fax:  [phone]  Physical Therapy Treatment  Patient Details  Name: [name] [name] MRN: [id number] Date of Birth: 02-14-1946 Referring Provider (PT): Thurman Coyer, DO   Encounter Date: 04/26/2019  PT End of Session - 04/26/19 1028    Visit Number  10   progress notedid visit 9  and recert submitted 7/9   Number of Visits  18    Date for PT Re-Evaluation  05/18/19    Authorization Type  UHC MCR/MCD next progress at 34 (1st one done at visit 9), KX at 61    PT Start Time  1015    PT Stop Time  1100    PT Time Calculation (min)  45 min    Activity Tolerance  Patient tolerated treatment well    Behavior During Therapy  [facility] for tasks assessed/performed       Past Medical History:  Diagnosis Date  . Anemia   . Arthritis   . Blood transfusion without reported diagnosis   . CAD 08/26/2006   Cath 07/05:multiple areas of nonobstructive disease = medical & RF mgmt, well preserved global systolic function. Dr. Lia Foyer. Nuclear med stress test 01/2014 : Normal stress nuclear study. LV Ejection Fraction: 76%. LV Wall Motion: NL LV Function; NL Wall Motion.      . Cataract   . Chronic kidney disease   . Chronic pain syndrome 01/03/2013   Multifactorial. Non opioid requiring.   L2-5 fusion, with hardware at L2-3, stable per MRI 2010. Followed by neurosurgery (Dr. 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    [name] DEPRESSION 08/26/2006   On paxil    . DIABETES MELLITUS, TYPE II 08/26/2006   Insulin dependent. Victoza added 03/2014  . DYSLIPIDEMIA 11/01/2006   LDL goal < 100, 70 if can achieve without side effects.     . Gallstones   . GERD 08/26/2006   Heartburn QHS.     . Glaucoma   . HYPERTENSION 08/26/2006   On BB, ACEI, lasix, norvasc, metolazone, and imdur.     . OBESITY 08/26/2006   BMI  39.5. Obesity Class 2.     . PVD (peripheral vascular disease) ([place]) 03/27/2014   2015 LE arterial Duplex - Possible mild inflow disease on the left. 0-49% bilateral SFA disease, without focal stenosis. Three vesel run-off, bilaterally. Medical tx.     Past Surgical History:  Procedure Laterality Date  . CHOLECYSTECTOMY     pt denies 04/07/16  . COLONOSCOPY    . endometrial biospy    . LEFT HEART CATH    . lumbar fusion surgery  10/08   L3-L5  . POLYPECTOMY    . posterior lumbar interfusion surgery  07/18/07   L2-L3  . rotator cuff surgery Bilateral     There were no vitals filed for this visit.  Subjective Assessment - 04/26/19 1026    Subjective  Pt still in a lot of pain, "I started not to come"    Pertinent History  PMH: lumbar fusion 2008, chronic LBP, CAD,  Lt heart Cath, prev RTC repair 12 years ago, PVD, DM, HTN    Diagnostic tests  XR: Acromioclavicular and glenohumeral osteoarthritis, no acute, neck CT imaging negative    Patient Stated Goals  to get my arm and neck better, to fix my hair without pain     Currently in  Pain?  Yes    Pain Score  8     Pain Location  Neck   and Lt shoulder   Pain Orientation  Left    Pain Descriptors / Indicators  Aching    Pain Type  Chronic pain    Pain Onset  More than a month ago                       [facility] Adult PT Treatment/Exercise - 04/26/19 0001      Neck Exercises: Machines for Strengthening   Nustep  L3 X 7 min UE/LE      Neck Exercises: Theraband   Shoulder Extension  20 reps;Red    Rows  Red;20 reps    Other Theraband Exercises  bilat ER yellow  x20      Neck Exercises: Seated   Neck Retraction  20 reps    Other Seated Exercise  AROM neck circles and shoulder rolls,  and neck extension X 15 ea, sidebending X 15 ea    Other Seated Exercise  UT stretch and levator stretch 30 sec X 2 on Lt      Shoulder Exercises: ROM/Strengthening   [place] Pulleys  X 10 flexion and scaption  2 min flexion and 2  min abd     Modalities   Modalities  Electrical Stimulation;Moist Heat      Moist Heat Therapy   Number Minutes Moist Heat  10 Minutes    Moist Heat Location  Lumbar Spine;Cervical      Electrical Stimulation   Electrical Stimulation Location  neck and lumbar    Electrical Stimulation Action  pre mod to both    Electrical Stimulation Parameters  tolearance    Electrical Stimulation Goals  Pain      Neck Exercises: Stretches   Upper Trapezius Stretch  Right;Left;2 reps;30 seconds                  PT Long Term Goals - 04/26/19 1049      PT LONG TERM GOAL #1   Title  Pt will be I and compliant with HEP. (Target goal for all goals 6 weeks 04/13/19)    Baseline  reports compliance    Status  Achieved      PT LONG TERM GOAL #2   Title  Pt will improve ROM in neck and Left shoulder to [facility] to improve mobility    Baseline  now [facility]    Status  Achieved      PT LONG TERM GOAL #3   Title  Pt will improve strength to at least 4/5 MMT to improve function    Baseline  4/5s    Status  Achieved      PT LONG TERM GOAL #4   Title  Pt will improve FOTO to less than 42 % limited to show improved function    Baseline  not retested today, 58 at eval    Status  On-going      PT LONG TERM GOAL #5   Title  Pt will reduce pain by overall 50%    Baseline  goal ongoing    Status  On-going            Plan - 04/26/19 1048    Clinical Impression Statement  despite high pt reported pain, she had good activity tolerance today for neck and shoulder stretching and strengthening and is now showing WFL ROM for her neck and shoulder. PT will continue  to progress gently as able.    Stability/Clinical Decision Making  Evolving/Moderate complexity    Rehab Potential  Good    PT Frequency  Other (comment)   1-2x/week   PT Duration  6 weeks    PT Treatment/Interventions  ADLs/Self Care Home Management;Cryotherapy;Electrical Stimulation;Iontophoresis 4mg /ml Dexamethasone;Moist  Heat;Traction;Ultrasound;Therapeutic activities;Therapeutic exercise;Neuromuscular re-education;Manual techniques;Passive range of motion;Dry needling;Taping;Spinal Manipulations;Joint Manipulations    PT Next Visit Plan  add further strengthening as tolerated pending pain, continue manual work with STM, check response dry needling, modalties as needed for pain    PT Home Exercise Plan  chin tucks supine, cerv rotation with towel, scap retraction, supine wand flexion, UT stretch    Consulted and Agree with Plan of Care  Patient       Patient will benefit from skilled therapeutic intervention in order to improve the following deficits and impairments:  Decreased endurance, Decreased activity tolerance, Decreased range of motion, Decreased strength, Hypomobility, Impaired flexibility, Increased muscle spasms, Postural dysfunction, Pain  Visit Diagnosis: 1. Chronic left shoulder pain   2. Cervicalgia   3. Muscle weakness (generalized)        Problem List Patient Active Problem List   Diagnosis Date Noted  . Ankle swelling, left 11/03/2018  . Esophageal dysphagia 03/03/2018  . Gait instability 03/03/2018  . Aortic atherosclerosis ([place]) 06/17/2017  . 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 Right carotid bruit 04/08/2017  . CKD stage 3 due to type 2 diabetes mellitus ([place]) 12/11/2016  . Diabetic neuropathy ([place]) 12/10/2016  . Small intestinal bacterial overgrowth 12/24/2015  . Goals of care, counseling/discussion 01/18/2015  . ASCUS with positive high risk HPV 08/03/2014  . PVD (peripheral vascular disease) ([place]) 03/27/2014  . Diastolic CHF, chronic ([place]) 12/21/2013  . Healthcare maintenance 04/25/2013  . Chronic pain syndrome 01/03/2013  . Insomnia 12/22/2011  . Vitamin B12 deficiency 12/27/2009  . Vitamin D deficiency 12/27/2009  . Anemia 12/27/2009  . Hypomagnesemia 12/29/2008  . Osteopenia 08/01/2008  . Hyperlipidemia 11/01/2006  . GLAUCOMA NOS 11/01/2006  . DM  (diabetes mellitus), type 2, uncontrolled, periph vascular complic ([place]) 17/79/3903  . Obesity 08/26/2006  . Depression 08/26/2006  . HTN (hypertension) 08/26/2006  . Coronary atherosclerosis 08/26/2006  . GERD 08/26/2006    Silvestre Mesi 04/26/2019, 10:52 AM  [facility] [address] [place], [place], [address] Phone: [phone]   Fax:  [phone]  Name: [name] [name] MRN: [id number] Date of Birth: 23-Aug-1946

## 2019-04-27 ENCOUNTER — Ambulatory Visit: Payer: Medicare Other | Admitting: Physical Therapy

## 2019-04-27 DIAGNOSIS — M542 Cervicalgia: Secondary | ICD-10-CM | POA: Diagnosis not present

## 2019-04-27 DIAGNOSIS — M6281 Muscle weakness (generalized): Secondary | ICD-10-CM | POA: Diagnosis not present

## 2019-04-27 DIAGNOSIS — G8929 Other chronic pain: Secondary | ICD-10-CM | POA: Diagnosis not present

## 2019-04-27 DIAGNOSIS — M25512 Pain in left shoulder: Secondary | ICD-10-CM | POA: Diagnosis not present

## 2019-04-27 NOTE — Therapy (Signed)
[place], [place], [address] Phone: [phone]   Fax:  [phone]  Physical Therapy Treatment  Patient Details  Name: [name] [name] MRN: [id number] Date of Birth: 1946/06/10 Referring Provider (PT): Thurman Coyer, DO   Encounter Date: 04/27/2019  PT End of Session - 04/27/19 1158    Visit Number  11   progress notedid visit 9  and recert submitted 7/9   Number of Visits  18    Date for PT Re-Evaluation  05/18/19    Authorization Type  UHC MCR/MCD next progress at 4 (1st one done at visit 9), KX at 4    PT Start Time  1115    PT Stop Time  1200    PT Time Calculation (min)  45 min    Activity Tolerance  Patient tolerated treatment well    Behavior During Therapy  [facility] for tasks assessed/performed       Past Medical History:  Diagnosis Date  . Anemia   . Arthritis   . Blood transfusion without reported diagnosis   . CAD 08/26/2006   Cath 07/05:multiple areas of nonobstructive disease = medical & RF mgmt, well preserved global systolic function. Dr. Lia Foyer. Nuclear med stress test 01/2014 : Normal stress nuclear study. LV Ejection Fraction: 76%. LV Wall Motion: NL LV Function; NL Wall Motion.      . Cataract   . Chronic kidney disease   . Chronic pain syndrome 01/03/2013   Multifactorial. Non opioid requiring.   L2-5 fusion, with hardware at L2-3, stable per MRI 2010. Followed by neurosurgery (Dr. 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    [name] DEPRESSION 08/26/2006   On paxil    . DIABETES MELLITUS, TYPE II 08/26/2006   Insulin dependent. Victoza added 03/2014  . DYSLIPIDEMIA 11/01/2006   LDL goal < 100, 70 if can achieve without side effects.     . Gallstones   . GERD 08/26/2006   Heartburn QHS.     . Glaucoma   . HYPERTENSION 08/26/2006   On BB, ACEI, lasix, norvasc, metolazone, and imdur.     . OBESITY 08/26/2006   BMI  39.5. Obesity Class 2.     . PVD (peripheral vascular disease) ([place]) 03/27/2014   2015 LE arterial Duplex - Possible mild inflow disease on the left. 0-49% bilateral SFA disease, without focal stenosis. Three vesel run-off, bilaterally. Medical tx.     Past Surgical History:  Procedure Laterality Date  . CHOLECYSTECTOMY     pt denies 04/07/16  . COLONOSCOPY    . endometrial biospy    . LEFT HEART CATH    . lumbar fusion surgery  10/08   L3-L5  . POLYPECTOMY    . posterior lumbar interfusion surgery  07/18/07   L2-L3  . rotator cuff surgery Bilateral     There were no vitals filed for this visit.  Subjective Assessment - 04/27/19 1146    Subjective  Relays her neck and shoulder are feeling a little better but having some Lt hip pain. Does not rate intensity of pain    Pertinent History  PMH: lumbar fusion 2008, chronic LBP, CAD,  Lt heart Cath, prev RTC repair 12 years ago, PVD, DM, HTN    Diagnostic tests  XR: Acromioclavicular and glenohumeral osteoarthritis, no acute, neck CT imaging negative    Patient Stated Goals  to get my arm and neck better, to fix  my hair without pain     Pain Onset  More than a month ago                       [facility] Adult PT Treatment/Exercise - 04/27/19 0001      Neck Exercises: Machines for Strengthening   Nustep  L5 X 6 min UE/LE      Neck Exercises: Theraband   Shoulder Extension  20 reps;Red    Rows  Red;20 reps    Other Theraband Exercises  bilat ER and H abd with yellow 2X10      Neck Exercises: Standing   Other Standing Exercises  ranger X 10 flexion and scaption      Neck Exercises: Seated   Neck Retraction  20 reps    Other Seated Exercise  UT stretch 30 sec X 2 bilat      Modalities   Modalities  Electrical Stimulation;Moist Heat      Moist Heat Therapy   Number Minutes Moist Heat  15 Minutes    Moist Heat Location  Lumbar Spine;Cervical      Electrical Stimulation   Electrical Stimulation Location  neck and  lumbar    Electrical Stimulation Action  Pre mod to both    Electrical Stimulation Parameters  tolerance    Electrical Stimulation Goals  Pain                  PT Long Term Goals - 04/26/19 1049      PT LONG TERM GOAL #1   Title  Pt will be I and compliant with HEP. (Target goal for all goals 6 weeks 04/13/19)    Baseline  reports compliance    Status  Achieved      PT LONG TERM GOAL #2   Title  Pt will improve ROM in neck and Left shoulder to [facility] to improve mobility    Baseline  now [facility]    Status  Achieved      PT LONG TERM GOAL #3   Title  Pt will improve strength to at least 4/5 MMT to improve function    Baseline  4/5s    Status  Achieved      PT LONG TERM GOAL #4   Title  Pt will improve FOTO to less than 42 % limited to show improved function    Baseline  not retested today, 58 at eval    Status  On-going      PT LONG TERM GOAL #5   Title  Pt will reduce pain by overall 50%    Baseline  goal ongoing    Status  On-going            Plan - 04/27/19 1158    Clinical Impression Statement  ROM and strength slowly improving with PT for her neck and shoulder. PT will continue to progress as able toward functional goals.    Stability/Clinical Decision Making  Evolving/Moderate complexity    Rehab Potential  Good    PT Frequency  Other (comment)   1-2x/week   PT Duration  6 weeks    PT Treatment/Interventions  ADLs/Self Care Home Management;Cryotherapy;Electrical Stimulation;Iontophoresis 4mg /ml Dexamethasone;Moist Heat;Traction;Ultrasound;Therapeutic activities;Therapeutic exercise;Neuromuscular re-education;Manual techniques;Passive range of motion;Dry needling;Taping;Spinal Manipulations;Joint Manipulations    PT Next Visit Plan  add further strengthening as tolerated pending pain, continue manual work with STM, check response dry needling, modalties as needed for pain    PT Home Exercise Plan  chin tucks supine,  cerv rotation with towel, scap retraction,  supine wand flexion, UT stretch    Consulted and Agree with Plan of Care  Patient       Patient will benefit from skilled therapeutic intervention in order to improve the following deficits and impairments:  Decreased endurance, Decreased activity tolerance, Decreased range of motion, Decreased strength, Hypomobility, Impaired flexibility, Increased muscle spasms, Postural dysfunction, Pain  Visit Diagnosis: 1. Chronic left shoulder pain   2. Cervicalgia   3. Muscle weakness (generalized)        Problem List Patient Active Problem List   Diagnosis Date Noted  . Ankle swelling, left 11/03/2018  . Esophageal dysphagia 03/03/2018  . Gait instability 03/03/2018  . Aortic atherosclerosis ([place]) 06/17/2017  . 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 Right carotid bruit 04/08/2017  . CKD stage 3 due to type 2 diabetes mellitus ([place]) 12/11/2016  . Diabetic neuropathy ([place]) 12/10/2016  . Small intestinal bacterial overgrowth 12/24/2015  . Goals of care, counseling/discussion 01/18/2015  . ASCUS with positive high risk HPV 08/03/2014  . PVD (peripheral vascular disease) ([place]) 03/27/2014  . Diastolic CHF, chronic ([place]) 12/21/2013  . Healthcare maintenance 04/25/2013  . Chronic pain syndrome 01/03/2013  . Insomnia 12/22/2011  . Vitamin B12 deficiency 12/27/2009  . Vitamin D deficiency 12/27/2009  . Anemia 12/27/2009  . Hypomagnesemia 12/29/2008  . Osteopenia 08/01/2008  . Hyperlipidemia 11/01/2006  . GLAUCOMA NOS 11/01/2006  . DM (diabetes mellitus), type 2, uncontrolled, periph vascular complic ([place]) 97/35/3299  . Obesity 08/26/2006  . Depression 08/26/2006  . HTN (hypertension) 08/26/2006  . Coronary atherosclerosis 08/26/2006  . GERD 08/26/2006    Silvestre Mesi 04/27/2019, 12:01 PM  [facility] [address] [place], [place], [address] Phone: [phone]   Fax:  [phone]  Name: JAZZMYN FILION MRN:  [id number] Date of Birth: 29-Jun-1946

## 2019-05-01 ENCOUNTER — Ambulatory Visit: Payer: Medicare Other | Admitting: Physical Therapy

## 2019-05-01 ENCOUNTER — Other Ambulatory Visit: Payer: Self-pay

## 2019-05-01 DIAGNOSIS — M25512 Pain in left shoulder: Secondary | ICD-10-CM

## 2019-05-01 DIAGNOSIS — G8929 Other chronic pain: Secondary | ICD-10-CM | POA: Diagnosis not present

## 2019-05-01 DIAGNOSIS — M6281 Muscle weakness (generalized): Secondary | ICD-10-CM | POA: Diagnosis not present

## 2019-05-01 DIAGNOSIS — M542 Cervicalgia: Secondary | ICD-10-CM | POA: Diagnosis not present

## 2019-05-01 NOTE — Therapy (Signed)
[place], [place], [address] Phone: [phone]   Fax:  [phone]  Physical Therapy Treatment  Patient Details  Name: [name] [name] MRN: [id number] Date of Birth: 06-01-1946 Referring Provider (PT): Thurman Coyer, DO   Encounter Date: 05/01/2019  PT End of Session - 05/01/19 1351    Visit Number  12   progress notedid visit 9  and recert submitted 7/9   Number of Visits  18    Date for PT Re-Evaluation  05/18/19    Authorization Type  UHC MCR/MCD next progress at 16 (1st one done at visit 9), KX at 36    PT Start Time  1300    PT Stop Time  1348    PT Time Calculation (min)  48 min    Activity Tolerance  Patient tolerated treatment well    Behavior During Therapy  [facility] for tasks assessed/performed       Past Medical History:  Diagnosis Date  . Anemia   . Arthritis   . Blood transfusion without reported diagnosis   . CAD 08/26/2006   Cath 07/05:multiple areas of nonobstructive disease = medical & RF mgmt, well preserved global systolic function. Dr. Lia Foyer. Nuclear med stress test 01/2014 : Normal stress nuclear study. LV Ejection Fraction: 76%. LV Wall Motion: NL LV Function; NL Wall Motion.      . Cataract   . Chronic kidney disease   . Chronic pain syndrome 01/03/2013   Multifactorial. Non opioid requiring.   L2-5 fusion, with hardware at L2-3, stable per MRI 2010. Followed by neurosurgery (Dr. 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    [name] DEPRESSION 08/26/2006   On paxil    . DIABETES MELLITUS, TYPE II 08/26/2006   Insulin dependent. Victoza added 03/2014  . DYSLIPIDEMIA 11/01/2006   LDL goal < 100, 70 if can achieve without side effects.     . Gallstones   . GERD 08/26/2006   Heartburn QHS.     . Glaucoma   . HYPERTENSION 08/26/2006   On BB, ACEI, lasix, norvasc, metolazone, and imdur.     . OBESITY 08/26/2006   BMI  39.5. Obesity Class 2.     . PVD (peripheral vascular disease) ([place]) 03/27/2014   2015 LE arterial Duplex - Possible mild inflow disease on the left. 0-49% bilateral SFA disease, without focal stenosis. Three vesel run-off, bilaterally. Medical tx.     Past Surgical History:  Procedure Laterality Date  . CHOLECYSTECTOMY     pt denies 04/07/16  . COLONOSCOPY    . endometrial biospy    . LEFT HEART CATH    . lumbar fusion surgery  10/08   L3-L5  . POLYPECTOMY    . posterior lumbar interfusion surgery  07/18/07   L2-L3  . rotator cuff surgery Bilateral     There were no vitals filed for this visit.  Subjective Assessment - 05/01/19 1324    Subjective  Relays her neck and shoulder are feeling a little better but having some Lt hip pain. Does not rate intensity of pain    Pertinent History  PMH: lumbar fusion 2008, chronic LBP, CAD,  Lt heart Cath, prev RTC repair 12 years ago, PVD, DM, HTN    Diagnostic tests  XR: Acromioclavicular and glenohumeral osteoarthritis, no acute, neck CT imaging negative    Patient Stated Goals  to get my arm and neck better, to fix  my hair without pain     Currently in Pain?  Yes    Pain Score  4     Pain Location  Shoulder   and neck   Pain Orientation  Left    Pain Descriptors / Indicators  Aching;Tightness    Pain Onset  More than a month ago                       [facility] Adult PT Treatment/Exercise - 05/01/19 0001      Neck Exercises: Machines for Strengthening   Nustep  L5 X 6 min UE/LE      Neck Exercises: Theraband   Shoulder Extension  20 reps;Red    Rows  Red;20 reps    Shoulder External Rotation  Red;20 reps    Shoulder Internal Rotation  Red;20 reps    Other Theraband Exercises  --      Neck Exercises: Standing   Other Standing Exercises  ranger X 10 flexion and scaption and circles      Neck Exercises: Seated   Neck Retraction  20 reps    Other Seated Exercise  UT stretch 30 sec X 2 bilat, posterior capsule stretch  on Lt 30 sec X 2      Lumbar Exercises: Aerobic   Nustep  5 min LE/UE L4      Modalities   Modalities  Electrical Stimulation;Moist Heat      Moist Heat Therapy   Number Minutes Moist Heat  15 Minutes    Moist Heat Location  Lumbar Spine;Cervical      Electrical Stimulation   Electrical Stimulation Location  neck and lumbar    Electrical Stimulation Action  Pre mod to both    Electrical Stimulation Parameters  tolerance    Electrical Stimulation Goals  Pain                  PT Long Term Goals - 04/26/19 1049      PT LONG TERM GOAL #1   Title  Pt will be I and compliant with HEP. (Target goal for all goals 6 weeks 04/13/19)    Baseline  reports compliance    Status  Achieved      PT LONG TERM GOAL #2   Title  Pt will improve ROM in neck and Left shoulder to [facility] to improve mobility    Baseline  now [facility]    Status  Achieved      PT LONG TERM GOAL #3   Title  Pt will improve strength to at least 4/5 MMT to improve function    Baseline  4/5s    Status  Achieved      PT LONG TERM GOAL #4   Title  Pt will improve FOTO to less than 42 % limited to show improved function    Baseline  not retested today, 58 at eval    Status  On-going      PT LONG TERM GOAL #5   Title  Pt will reduce pain by overall 50%    Baseline  goal ongoing    Status  On-going            Plan - 05/01/19 1352    Clinical Impression Statement  Able to progress stretching and strengthening today with good tolerance. PT will continue to progress as able toward her functional goals.    Stability/Clinical Decision Making  Evolving/Moderate complexity    Rehab Potential  Good  PT Frequency  Other (comment)   1-2x/week   PT Duration  6 weeks    PT Treatment/Interventions  ADLs/Self Care Home Management;Cryotherapy;Electrical Stimulation;Iontophoresis 4mg /ml Dexamethasone;Moist Heat;Traction;Ultrasound;Therapeutic activities;Therapeutic exercise;Neuromuscular re-education;Manual  techniques;Passive range of motion;Dry needling;Taping;Spinal Manipulations;Joint Manipulations    PT Next Visit Plan  add further strengthening as tolerated pending pain, continue manual work with STM, check response dry needling, modalties as needed for pain    PT Home Exercise Plan  chin tucks supine, cerv rotation with towel, scap retraction, supine wand flexion, UT stretch    Consulted and Agree with Plan of Care  Patient       Patient will benefit from skilled therapeutic intervention in order to improve the following deficits and impairments:  Decreased endurance, Decreased activity tolerance, Decreased range of motion, Decreased strength, Hypomobility, Impaired flexibility, Increased muscle spasms, Postural dysfunction, Pain  Visit Diagnosis: 1. Chronic left shoulder pain   2. Cervicalgia   3. Muscle weakness (generalized)        Problem List Patient Active Problem List   Diagnosis Date Noted  . Ankle swelling, left 11/03/2018  . Esophageal dysphagia 03/03/2018  . Gait instability 03/03/2018  . Aortic atherosclerosis ([place]) 06/17/2017  . 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 Right carotid bruit 04/08/2017  . CKD stage 3 due to type 2 diabetes mellitus ([place]) 12/11/2016  . Diabetic neuropathy ([place]) 12/10/2016  . Small intestinal bacterial overgrowth 12/24/2015  . Goals of care, counseling/discussion 01/18/2015  . ASCUS with positive high risk HPV 08/03/2014  . PVD (peripheral vascular disease) ([place]) 03/27/2014  . Diastolic CHF, chronic ([place]) 12/21/2013  . Healthcare maintenance 04/25/2013  . Chronic pain syndrome 01/03/2013  . Insomnia 12/22/2011  . Vitamin B12 deficiency 12/27/2009  . Vitamin D deficiency 12/27/2009  . Anemia 12/27/2009  . Hypomagnesemia 12/29/2008  . Osteopenia 08/01/2008  . Hyperlipidemia 11/01/2006  . GLAUCOMA NOS 11/01/2006  . DM (diabetes mellitus), type 2, uncontrolled, periph vascular complic ([place]) 52/84/1324  . Obesity 08/26/2006  .  Depression 08/26/2006  . HTN (hypertension) 08/26/2006  . Coronary atherosclerosis 08/26/2006  . GERD 08/26/2006    Silvestre Mesi 05/01/2019, 1:54 PM  [facility] [address] [place], [place], [address] Phone: [phone]   Fax:  [phone]  Name: LEEANNA SLABY MRN: [id number] Date of Birth: [birth year]

## 2019-05-02 ENCOUNTER — Other Ambulatory Visit: Payer: Self-pay | Admitting: Internal Medicine

## 2019-05-02 NOTE — Telephone Encounter (Signed)
Please schedule appointment with me for diabetes management

## 2019-05-04 ENCOUNTER — Ambulatory Visit: Payer: Medicare Other | Admitting: Physical Therapy

## 2019-05-04 ENCOUNTER — Other Ambulatory Visit: Payer: Self-pay

## 2019-05-04 DIAGNOSIS — G8929 Other chronic pain: Secondary | ICD-10-CM

## 2019-05-04 DIAGNOSIS — M542 Cervicalgia: Secondary | ICD-10-CM | POA: Diagnosis not present

## 2019-05-04 DIAGNOSIS — M6281 Muscle weakness (generalized): Secondary | ICD-10-CM | POA: Diagnosis not present

## 2019-05-04 DIAGNOSIS — M25512 Pain in left shoulder: Secondary | ICD-10-CM | POA: Diagnosis not present

## 2019-05-04 NOTE — Therapy (Signed)
[place], [place], [address] Phone: [phone]   Fax:  [phone]  Physical Therapy Treatment  Patient Details  Name: [name] [name] MRN: [id number] Date of Birth: March 13, 1946 Referring Provider (PT): Thurman Coyer, DO   Encounter Date: 05/04/2019  PT End of Session - 05/04/19 1345    Visit Number  13   progress notedid visit 9  and recert submitted 7/9   Number of Visits  18    Date for PT Re-Evaluation  05/18/19    Authorization Type  UHC MCR/MCD next progress at 84 (1st one done at visit 9), KX at 73    PT Start Time  1300    PT Stop Time  1344    PT Time Calculation (min)  44 min    Activity Tolerance  Patient tolerated treatment well    Behavior During Therapy  [facility] for tasks assessed/performed       Past Medical History:  Diagnosis Date  . Anemia   . Arthritis   . Blood transfusion without reported diagnosis   . CAD 08/26/2006   Cath 07/05:multiple areas of nonobstructive disease = medical & RF mgmt, well preserved global systolic function. Dr. Lia Foyer. Nuclear med stress test 01/2014 : Normal stress nuclear study. LV Ejection Fraction: 76%. LV Wall Motion: NL LV Function; NL Wall Motion.      . Cataract   . Chronic kidney disease   . Chronic pain syndrome 01/03/2013   Multifactorial. Non opioid requiring.   L2-5 fusion, with hardware at L2-3, stable per MRI 2010. Followed by neurosurgery (Dr. 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    [name] DEPRESSION 08/26/2006   On paxil    . DIABETES MELLITUS, TYPE II 08/26/2006   Insulin dependent. Victoza added 03/2014  . DYSLIPIDEMIA 11/01/2006   LDL goal < 100, 70 if can achieve without side effects.     . Gallstones   . GERD 08/26/2006   Heartburn QHS.     . Glaucoma   . HYPERTENSION 08/26/2006   On BB, ACEI, lasix, norvasc, metolazone, and imdur.     . OBESITY 08/26/2006   BMI  39.5. Obesity Class 2.     . PVD (peripheral vascular disease) ([place]) 03/27/2014   2015 LE arterial Duplex - Possible mild inflow disease on the left. 0-49% bilateral SFA disease, without focal stenosis. Three vesel run-off, bilaterally. Medical tx.     Past Surgical History:  Procedure Laterality Date  . CHOLECYSTECTOMY     pt denies 04/07/16  . COLONOSCOPY    . endometrial biospy    . LEFT HEART CATH    . lumbar fusion surgery  10/08   L3-L5  . POLYPECTOMY    . posterior lumbar interfusion surgery  07/18/07   L2-L3  . rotator cuff surgery Bilateral     There were no vitals filed for this visit.  Subjective Assessment - 05/04/19 1315    Subjective  Relays she is doing a little better, pain has decreased to 6/10 in her neck, Lt shoulder and back                       [facility] Adult PT Treatment/Exercise - 05/04/19 0001      Neck Exercises: Machines for Strengthening   Nustep  L5 X 6 min UE/LE      Neck Exercises: Theraband   Shoulder Extension  20  reps;Red    Rows  Red;20 reps    Shoulder External Rotation  Red;20 reps    Shoulder Internal Rotation  Red;20 reps      Neck Exercises: Standing   Other Standing Exercises  ranger X 10 flexion and scaption and circles      Neck Exercises: Seated   Neck Retraction  20 reps    Other Seated Exercise  UT stretch 30 sec X 2 bilat, posterior capsule stretch on Lt 30 sec X 2      Lumbar Exercises: Aerobic   Nustep  5 min LE/UE L4      Modalities   Modalities  Electrical Stimulation;Moist Heat      Moist Heat Therapy   Number Minutes Moist Heat  15 Minutes    Moist Heat Location  Lumbar Spine;Cervical      Electrical Stimulation   Electrical Stimulation Location  neck and lumbar    Electrical Stimulation Action  pre mod to both    Electrical Stimulation Parameters  tolerance    Electrical Stimulation Goals  Pain                  PT Long Term Goals - 04/26/19 1049      PT LONG TERM GOAL #1    Title  Pt will be I and compliant with HEP. (Target goal for all goals 6 weeks 04/13/19)    Baseline  reports compliance    Status  Achieved      PT LONG TERM GOAL #2   Title  Pt will improve ROM in neck and Left shoulder to [facility] to improve mobility    Baseline  now [facility]    Status  Achieved      PT LONG TERM GOAL #3   Title  Pt will improve strength to at least 4/5 MMT to improve function    Baseline  4/5s    Status  Achieved      PT LONG TERM GOAL #4   Title  Pt will improve FOTO to less than 42 % limited to show improved function    Baseline  not retested today, 58 at eval    Status  On-going      PT LONG TERM GOAL #5   Title  Pt will reduce pain by overall 50%    Baseline  goal ongoing    Status  On-going            Plan - 05/04/19 1345    Clinical Impression Statement  no complaints today with strengtheining, Lt shoulder ROM now looking good. Continue POC    Stability/Clinical Decision Making  Evolving/Moderate complexity    Rehab Potential  Good    PT Frequency  Other (comment)   1-2x/week   PT Duration  6 weeks    PT Treatment/Interventions  ADLs/Self Care Home Management;Cryotherapy;Electrical Stimulation;Iontophoresis 4mg /ml Dexamethasone;Moist Heat;Traction;Ultrasound;Therapeutic activities;Therapeutic exercise;Neuromuscular re-education;Manual techniques;Passive range of motion;Dry needling;Taping;Spinal Manipulations;Joint Manipulations    PT Next Visit Plan  add further strengthening as tolerated pending pain, continue manual work with STM, check response dry needling, modalties as needed for pain    PT Home Exercise Plan  chin tucks supine, cerv rotation with towel, scap retraction, supine wand flexion, UT stretch    Consulted and Agree with Plan of Care  Patient       Patient will benefit from skilled therapeutic intervention in order to improve the following deficits and impairments:  Decreased endurance, Decreased activity tolerance, Decreased range of motion,  Decreased  strength, Hypomobility, Impaired flexibility, Increased muscle spasms, Postural dysfunction, Pain  Visit Diagnosis: 1. Chronic left shoulder pain   2. Cervicalgia   3. Muscle weakness (generalized)        Problem List Patient Active Problem List   Diagnosis Date Noted  . Ankle swelling, left 11/03/2018  . Esophageal dysphagia 03/03/2018  . Gait instability 03/03/2018  . Aortic atherosclerosis ([place]) 06/17/2017  . 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 Right carotid bruit 04/08/2017  . CKD stage 3 due to type 2 diabetes mellitus ([place]) 12/11/2016  . Diabetic neuropathy ([place]) 12/10/2016  . Small intestinal bacterial overgrowth 12/24/2015  . Goals of care, counseling/discussion 01/18/2015  . ASCUS with positive high risk HPV 08/03/2014  . PVD (peripheral vascular disease) ([place]) 03/27/2014  . Diastolic CHF, chronic ([place]) 12/21/2013  . Healthcare maintenance 04/25/2013  . Chronic pain syndrome 01/03/2013  . Insomnia 12/22/2011  . Vitamin B12 deficiency 12/27/2009  . Vitamin D deficiency 12/27/2009  . Anemia 12/27/2009  . Hypomagnesemia 12/29/2008  . Osteopenia 08/01/2008  . Hyperlipidemia 11/01/2006  . GLAUCOMA NOS 11/01/2006  . DM (diabetes mellitus), type 2, uncontrolled, periph vascular complic ([place]) 34/37/3578  . Obesity 08/26/2006  . Depression 08/26/2006  . HTN (hypertension) 08/26/2006  . Coronary atherosclerosis 08/26/2006  . GERD 08/26/2006    Silvestre Mesi 05/04/2019, 1:46 PM  [facility] [address] [place], [place], [address] Phone: [phone]   Fax:  [phone]  Name: [name] [name] MRN: [id number] Date of Birth: 23-Mar-1946

## 2019-05-08 ENCOUNTER — Other Ambulatory Visit: Payer: Self-pay

## 2019-05-08 ENCOUNTER — Ambulatory Visit: Payer: Medicare Other | Admitting: Physical Therapy

## 2019-05-08 DIAGNOSIS — M6281 Muscle weakness (generalized): Secondary | ICD-10-CM

## 2019-05-08 DIAGNOSIS — G8929 Other chronic pain: Secondary | ICD-10-CM

## 2019-05-08 DIAGNOSIS — M542 Cervicalgia: Secondary | ICD-10-CM | POA: Diagnosis not present

## 2019-05-08 DIAGNOSIS — M25512 Pain in left shoulder: Secondary | ICD-10-CM | POA: Diagnosis not present
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
## 2019-05-10 ENCOUNTER — Other Ambulatory Visit: Payer: Self-pay | Admitting: Internal Medicine

## 2019-05-11 ENCOUNTER — Other Ambulatory Visit: Payer: Self-pay

## 2019-05-11 ENCOUNTER — Ambulatory Visit: Payer: Medicare Other | Admitting: Physical Therapy

## 2019-05-11 DIAGNOSIS — M6281 Muscle weakness (generalized): Secondary | ICD-10-CM | POA: Diagnosis not present

## 2019-05-11 DIAGNOSIS — G8929 Other chronic pain: Secondary | ICD-10-CM | POA: Diagnosis not present

## 2019-05-11 DIAGNOSIS — M25512 Pain in left shoulder: Secondary | ICD-10-CM

## 2019-05-11 DIAGNOSIS — M542 Cervicalgia: Secondary | ICD-10-CM
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
## 2019-05-15 ENCOUNTER — Ambulatory Visit: Payer: Medicare Other | Admitting: Physical Therapy

## 2019-05-16 DIAGNOSIS — E119 Type 2 diabetes mellitus without complications: Secondary | ICD-10-CM | POA: Diagnosis not present

## 2019-05-16 LAB — HEMOGLOBIN A1C: Hemoglobin A1C: 6.4

## 2019-05-18 ENCOUNTER — Ambulatory Visit: Payer: Medicare Other | Attending: Sports Medicine | Admitting: Physical Therapy

## 2019-05-18 ENCOUNTER — Other Ambulatory Visit: Payer: Self-pay

## 2019-05-18 DIAGNOSIS — M542 Cervicalgia: Secondary | ICD-10-CM | POA: Diagnosis not present

## 2019-05-18 DIAGNOSIS — M6281 Muscle weakness (generalized): Secondary | ICD-10-CM | POA: Insufficient documentation

## 2019-05-18 DIAGNOSIS — M25512 Pain in left shoulder: Secondary | ICD-10-CM | POA: Insufficient documentation

## 2019-05-18 DIAGNOSIS — G8929 Other chronic pain: Secondary | ICD-10-CM | POA: Diagnosis not present

## 2019-05-18 NOTE — Therapy (Signed)
[place], [place], [address] Phone: [phone]   Fax:  [phone]  Physical Therapy Treatment  Patient Details  Name: [name] [name] MRN: [id number] Date of Birth: 1946/09/26 Referring Provider (PT): Thurman Coyer, DO   Encounter Date: 05/18/2019  PT End of Session - 05/18/19 1029    Visit Number  16   progress notedid visit 9  and recert submitted 7/9   Number of Visits  18    Date for PT Re-Evaluation  05/18/19    Authorization Type  [employer] MCR/MCD next progress at 41 (1st one done at visit 9), KX at 71    PT Start Time  1050    PT Stop Time  1140    PT Time Calculation (min)  50 min    Activity Tolerance  Patient tolerated treatment well    Behavior During Therapy  [facility] for tasks assessed/performed       Past Medical History:  Diagnosis Date  . Anemia   . Arthritis   . Blood transfusion without reported diagnosis   . CAD 08/26/2006   Cath 07/05:multiple areas of nonobstructive disease = medical & RF mgmt, well preserved global systolic function. Dr. Lia Foyer. Nuclear med stress test 01/2014 : Normal stress nuclear study. LV Ejection Fraction: 76%. LV Wall Motion: NL LV Function; NL Wall Motion.      . Cataract   . Chronic kidney disease   . Chronic pain syndrome 01/03/2013   Multifactorial. Non opioid requiring.   L2-5 fusion, with hardware at L2-3, stable per MRI 2010. Followed by neurosurgery (Dr. 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    [name] DEPRESSION 08/26/2006   On paxil    . DIABETES MELLITUS, TYPE II 08/26/2006   Insulin dependent. Victoza added 03/2014  . DYSLIPIDEMIA 11/01/2006   LDL goal < 100, 70 if can achieve without side effects.     . Gallstones   . GERD 08/26/2006   Heartburn QHS.     . Glaucoma   . HYPERTENSION 08/26/2006   On BB, ACEI, lasix, norvasc, metolazone, and imdur.     . OBESITY 08/26/2006   BMI  39.5. Obesity Class 2.     . PVD (peripheral vascular disease) ([place]) 03/27/2014   2015 LE arterial Duplex - Possible mild inflow disease on the left. 0-49% bilateral SFA disease, without focal stenosis. Three vesel run-off, bilaterally. Medical tx.     Past Surgical History:  Procedure Laterality Date  . CHOLECYSTECTOMY     pt denies 04/07/16  . COLONOSCOPY    . endometrial biospy    . LEFT HEART CATH    . lumbar fusion surgery  10/08   L3-L5  . POLYPECTOMY    . posterior lumbar interfusion surgery  07/18/07   L2-L3  . rotator cuff surgery Bilateral     There were no vitals filed for this visit.  Subjective Assessment - 05/18/19 1011    Subjective  Pain overall a little better, 6/10 overall in back, neck and Lt shoulder    Pertinent History  PMH: lumbar fusion 2008, chronic LBP, CAD,  Lt heart Cath, prev RTC repair 12 years ago, PVD, DM, HTN    Diagnostic tests  XR: Acromioclavicular and glenohumeral osteoarthritis, no acute, neck CT imaging negative    Patient Stated Goals  to get my arm and neck better, to fix my hair without pain     Pain  Onset  More than a month ago                       [facility] Adult PT Treatment/Exercise - 05/18/19 0001      Neck Exercises: Machines for Strengthening   Nustep  L5 X 6 min UE/LE      Neck Exercises: Theraband   Shoulder Extension  20 reps;Red    Rows  Red;20 reps    Shoulder External Rotation  Red;20 reps    Shoulder Internal Rotation  Red;20 reps      Neck Exercises: Seated   Neck Retraction  20 reps    Other Seated Exercise  UT stretch 30 sec X 2 bilat, posterior capsule stretch on Lt 30 sec X 2      Lumbar Exercises: Aerobic   Nustep  6 min LE/UE L5      Moist Heat Therapy   Number Minutes Moist Heat  15 Minutes    Moist Heat Location  Lumbar Spine;Cervical      Electrical Stimulation   Electrical Stimulation Location  neck and lumbar    Electrical Stimulation Action  pre mod to both    Electrical Stimulation  Parameters  tolerance    Electrical Stimulation Goals  Pain                  PT Long Term Goals - 04/26/19 1049      PT LONG TERM GOAL #1   Title  Pt will be I and compliant with HEP. (Target goal for all goals 6 weeks 04/13/19)    Baseline  reports compliance    Status  Achieved      PT LONG TERM GOAL #2   Title  Pt will improve ROM in neck and Left shoulder to [facility] to improve mobility    Baseline  now [facility]    Status  Achieved      PT LONG TERM GOAL #3   Title  Pt will improve strength to at least 4/5 MMT to improve function    Baseline  4/5s    Status  Achieved      PT LONG TERM GOAL #4   Title  Pt will improve FOTO to less than 42 % limited to show improved function    Baseline  not retested today, 58 at eval    Status  On-going      PT LONG TERM GOAL #5   Title  Pt will reduce pain by overall 50%    Baseline  goal ongoing    Status  On-going            Plan - 05/18/19 1029    Clinical Impression Statement  Appears to be improving ROM, strength and funciton. her pain is overall improving but still 6/10 however she has no complaints with any of the exercises and she has improved her foot clearance with gait    Stability/Clinical Decision Making  Evolving/Moderate complexity    Rehab Potential  Good    PT Frequency  Other (comment)   1-2x/week   PT Duration  6 weeks    PT Treatment/Interventions  ADLs/Self Care Home Management;Cryotherapy;Electrical Stimulation;Iontophoresis 4mg /ml Dexamethasone;Moist Heat;Traction;Ultrasound;Therapeutic activities;Therapeutic exercise;Neuromuscular re-education;Manual techniques;Passive range of motion;Dry needling;Taping;Spinal Manipulations;Joint Manipulations    PT Next Visit Plan  add further strengthening as tolerated pending pain, continue manual work with STM, check response dry needling, modalties as needed for pain    PT Home Exercise Plan  chin tucks supine, cerv  rotation with towel, scap retraction, supine wand  flexion, UT stretch    Consulted and Agree with Plan of Care  Patient       Patient will benefit from skilled therapeutic intervention in order to improve the following deficits and impairments:  Decreased endurance, Decreased activity tolerance, Decreased range of motion, Decreased strength, Hypomobility, Impaired flexibility, Increased muscle spasms, Postural dysfunction, Pain  Visit Diagnosis: 1. Chronic left shoulder pain   2. Cervicalgia   3. Muscle weakness (generalized)        Problem List Patient Active Problem List   Diagnosis Date Noted  . Ankle swelling, left 11/03/2018  . Esophageal dysphagia 03/03/2018  . Gait instability 03/03/2018  . Aortic atherosclerosis ([place]) 06/17/2017  . 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 Right carotid bruit 04/08/2017  . CKD stage 3 due to type 2 diabetes mellitus ([place]) 12/11/2016  . Diabetic neuropathy ([place]) 12/10/2016  . Small intestinal bacterial overgrowth 12/24/2015  . Goals of care, counseling/discussion 01/18/2015  . ASCUS with positive high risk HPV 08/03/2014  . PVD (peripheral vascular disease) ([place]) 03/27/2014  . Diastolic CHF, chronic ([place]) 12/21/2013  . Healthcare maintenance 04/25/2013  . Chronic pain syndrome 01/03/2013  . Insomnia 12/22/2011  . Vitamin B12 deficiency 12/27/2009  . Vitamin D deficiency 12/27/2009  . Anemia 12/27/2009  . Hypomagnesemia 12/29/2008  . Osteopenia 08/01/2008  . Hyperlipidemia 11/01/2006  . GLAUCOMA NOS 11/01/2006  . DM (diabetes mellitus), type 2, uncontrolled, periph vascular complic ([place]) 78/67/6720  . Obesity 08/26/2006  . Depression 08/26/2006  . HTN (hypertension) 08/26/2006  . Coronary atherosclerosis 08/26/2006  . GERD 08/26/2006    Silvestre Mesi 05/18/2019, 11:25 AM  [facility] [address] [place], [place], [address] Phone: [phone]   Fax:  [phone]  Name: [name] [name] MRN: [id number] Date of  Birth: April 25, 1946

## 2019-05-23 ENCOUNTER — Other Ambulatory Visit: Payer: Self-pay | Admitting: Internal Medicine

## 2019-05-23 DIAGNOSIS — IMO0002 Reserved for concepts with insufficient information to code with codable children: Secondary | ICD-10-CM

## 2019-05-23 DIAGNOSIS — E1165 Type 2 diabetes mellitus with hyperglycemia: Secondary | ICD-10-CM

## 2019-05-23 DIAGNOSIS — E1151 Type 2 diabetes mellitus with diabetic peripheral angiopathy without gangrene: Secondary | ICD-10-CM

## 2019-05-24 ENCOUNTER — Other Ambulatory Visit: Payer: Self-pay | Admitting: *Deleted

## 2019-05-24 ENCOUNTER — Encounter: Payer: Self-pay | Admitting: Podiatry

## 2019-05-24 ENCOUNTER — Other Ambulatory Visit: Payer: Self-pay

## 2019-05-24 ENCOUNTER — Ambulatory Visit (INDEPENDENT_AMBULATORY_CARE_PROVIDER_SITE_OTHER): Payer: Medicare Other | Admitting: Podiatry

## 2019-05-24 DIAGNOSIS — B351 Tinea unguium: Secondary | ICD-10-CM

## 2019-05-24 DIAGNOSIS — E1142 Type 2 diabetes mellitus with diabetic polyneuropathy: Secondary | ICD-10-CM | POA: Diagnosis not present

## 2019-05-24 DIAGNOSIS — L84 Corns and callosities: Secondary | ICD-10-CM

## 2019-05-24 DIAGNOSIS — M79675 Pain in left toe(s): Secondary | ICD-10-CM

## 2019-05-24 DIAGNOSIS — M79674 Pain in right toe(s): Secondary | ICD-10-CM | POA: Diagnosis not present

## 2019-05-24 NOTE — Patient Instructions (Signed)
Diabetes Mellitus and Foot Care Foot care is an important part of your health, especially when you have diabetes. Diabetes may cause you to have problems because of poor blood flow (circulation) to your feet and legs, which can cause your skin to:  Become thinner and drier.  Break more easily.  Heal more slowly.  Peel and crack. You may also have nerve damage (neuropathy) in your legs and feet, causing decreased feeling in them. This means that you may not notice minor injuries to your feet that could lead to more serious problems. Noticing and addressing any potential problems early is the best way to prevent future foot problems. How to care for your feet Foot hygiene  Wash your feet daily with warm water and mild soap. Do not use hot water. Then, pat your feet and the areas between your toes until they are completely dry. Do not soak your feet as this can dry your skin.  Trim your toenails straight across. Do not dig under them or around the cuticle. File the edges of your nails with an emery board or nail file.  Apply a moisturizing lotion or petroleum jelly to the skin on your feet and to dry, brittle toenails. Use lotion that does not contain alcohol and is unscented. Do not apply lotion between your toes. Shoes and socks  Wear clean socks or stockings every day. Make sure they are not too tight. Do not wear knee-high stockings since they may decrease blood flow to your legs.  Wear shoes that fit properly and have enough cushioning. Always look in your shoes before you put them on to be sure there are no objects inside.  To break in new shoes, wear them for just a few hours a day. This prevents injuries on your feet. Wounds, scrapes, corns, and calluses  Check your feet daily for blisters, cuts, bruises, sores, and redness. If you cannot see the bottom of your feet, use a mirror or ask someone for help.  Do not cut corns or calluses or try to remove them with medicine.  If you  find a minor scrape, cut, or break in the skin on your feet, keep it and the skin around it clean and dry. You may clean these areas with mild soap and water. Do not clean the area with peroxide, alcohol, or iodine.  If you have a wound, scrape, corn, or callus on your foot, look at it several times a day to make sure it is healing and not infected. Check for: ? Redness, swelling, or pain. ? Fluid or blood. ? Warmth. ? Pus or a bad smell. General instructions  Do not cross your legs. This may decrease blood flow to your feet.  Do not use heating pads or hot water bottles on your feet. They may burn your skin. If you have lost feeling in your feet or legs, you may not know this is happening until it is too late.  Protect your feet from hot and cold by wearing shoes, such as at the beach or on hot pavement.  Schedule a complete foot exam at least once a year (annually) or more often if you have foot problems. If you have foot problems, report any cuts, sores, or bruises to your health care provider immediately. Contact a health care provider if:  You have a medical condition that increases your risk of infection and you have any cuts, sores, or bruises on your feet.  You have an injury that is not   healing.  You have redness on your legs or feet.  You feel burning or tingling in your legs or feet.  You have pain or cramps in your legs and feet.  Your legs or feet are numb.  Your feet always feel cold.  You have pain around a toenail. Get help right away if:  You have a wound, scrape, corn, or callus on your foot and: ? You have pain, swelling, or redness that gets worse. ? You have fluid or blood coming from the wound, scrape, corn, or callus. ? Your wound, scrape, corn, or callus feels warm to the touch. ? You have pus or a bad smell coming from the wound, scrape, corn, or callus. ? You have a fever. ? You have a red line going up your leg. Summary  Check your feet every day  for cuts, sores, red spots, swelling, and blisters.  Moisturize feet and legs daily.  Wear shoes that fit properly and have enough cushioning.  If you have foot problems, report any cuts, sores, or bruises to your health care provider immediately.  Schedule a complete foot exam at least once a year (annually) or more often if you have foot problems. This information is not intended to replace advice given to you by your health care provider. Make sure you discuss any questions you have with your health care provider. Document Released: 09/25/2000 Document Revised: 11/10/2017 Document Reviewed: 10/30/2016 Elsevier Patient Education  2020 Elsevier Inc.   Onychomycosis/Fungal Toenails  WHAT IS IT? An infection that lies within the keratin of your nail plate that is caused by a fungus.  WHY ME? Fungal infections affect all ages, sexes, races, and creeds.  There may be many factors that predispose you to a fungal infection such as age, coexisting medical conditions such as diabetes, or an autoimmune disease; stress, medications, fatigue, genetics, etc.  Bottom line: fungus thrives in a warm, moist environment and your shoes offer such a location.  IS IT CONTAGIOUS? Theoretically, yes.  You do not want to share shoes, nail clippers or files with someone who has fungal toenails.  Walking around barefoot in the same room or sleeping in the same bed is unlikely to transfer the organism.  It is important to realize, however, that fungus can spread easily from one nail to the next on the same foot.  HOW DO WE TREAT THIS?  There are several ways to treat this condition.  Treatment may depend on many factors such as age, medications, pregnancy, liver and kidney conditions, etc.  It is best to ask your doctor which options are available to you.  1. No treatment.   Unlike many other medical concerns, you can live with this condition.  However for many people this can be a painful condition and may lead to  ingrown toenails or a bacterial infection.  It is recommended that you keep the nails cut short to help reduce the amount of fungal nail. 2. Topical treatment.  These range from herbal remedies to prescription strength nail lacquers.  About 40-50% effective, topicals require twice daily application for approximately 9 to 12 months or until an entirely new nail has grown out.  The most effective topicals are medical grade medications available through physicians offices. 3. Oral antifungal medications.  With an 80-90% cure rate, the most common oral medication requires 3 to 4 months of therapy and stays in your system for a year as the new nail grows out.  Oral antifungal medications do require   blood work to make sure it is a safe drug for you.  A liver function panel will be performed prior to starting the medication and after the first month of treatment.  It is important to have the blood work performed to avoid any harmful side effects.  In general, this medication safe but blood work is required. 4. Laser Therapy.  This treatment is performed by applying a specialized laser to the affected nail plate.  This therapy is noninvasive, fast, and non-painful.  It is not covered by insurance and is therefore, out of pocket.  The results have been very good with a 80-95% cure rate.  The [facility] is the only practice in the area to offer this therapy. 5. Permanent Nail Avulsion.  Removing the entire nail so that a new nail will not grow back. 

## 2019-05-24 NOTE — Progress Notes (Signed)
Subjective: [name] [name] presents with diabetes, diabetic neuropathy and cc of painful, discolored, thick toenails and b/l plantar calluses which interfere with activities of daily living. Pain is aggravated when wearing enclosed shoe gear. Pain is relieved with periodic professional debridement.  Bartholomew Crews, MD is her PCP.   She voices no new pedal concerns on today's visit.   Current Outpatient Medications:  .  Accu-Chek FastClix Lancets MISC, USE THREE TIMES DAILY. 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  ACCU-CHEK GUIDE test strip, TEST BLOOD SUGAR 3 TIMES DAILY, Disp: 100 strip, Rfl: 5 .  amLODipine (NORVASC) 5 MG tablet, Take 1 tablet (5 mg total) by mouth daily., Disp: 90 tablet, Rfl: 3 .  aspirin 81 MG tablet, Take 1 tablet (81 mg total) by mouth daily., Disp: 90 tablet, Rfl: 3 .  atenolol (TENORMIN) 100 MG tablet, Take 1 tablet (100 mg total) by mouth daily., Disp: 90 tablet, Rfl: 3 .  atorvastatin (LIPITOR) 10 MG tablet, TAKE 1 TABLET BY MOUTH DAILY, Disp: 90 tablet, Rfl: 3 .  benazepril (LOTENSIN) 40 MG tablet, TAKE 1 TABLET(40 MG) BY MOUTH DAILY, Disp: 90 tablet, Rfl: 1 .  Blood Glucose Monitoring Suppl (ACCU-CHEK GUIDE) w/Device KIT, 1 each by Does not apply route 3 (three) times daily., Disp: 1 kit, Rfl: 0 .  furosemide (LASIX) 40 MG tablet, TAKE 1 TABLET BY MOUTH ONCE DAILY, Disp: 90 tablet, Rfl: 3 .  Insulin Glargine (LANTUS SOLOSTAR) 100 UNIT/ML Solostar Pen, Inject 12 Units into the skin daily with breakfast., Disp: 15 mL, Rfl: 2 .  Insulin Pen Needle (BD PEN NEEDLE NANO U/F) 32G X 4 MM MISC, USE TO INJECT VICTOZA ONCE A DAY AND LANTUS ONCE A DAY AS DIRECTED, Disp: 100 each, Rfl: 5 .  isosorbide mononitrate (IMDUR) 30 MG 24 hr tablet, TAKE 1 TABLET BY MOUTH ONCE DAILY, Disp: 90 tablet, Rfl: 3 .  liraglutide (VICTOZA) 18 MG/3ML SOPN, ADMINISTER 1.2 MG UNDER THE SKIN DAILY, Disp: 18 mL, Rfl: 5 .  loratadine (CLARITIN) 10 MG tablet, Take 1 tablet  (10 mg total) by mouth daily., Disp: 90 tablet, Rfl: 3 .  magnesium chloride (SLOW-MAG) 64 MG TBEC SR tablet, Take by mouth., Disp: , Rfl:  .  Melatonin 1 MG TABS, Take 1 tablet (1 mg total) by mouth at bedtime., Disp: 90 tablet, Rfl: 3 .  metFORMIN (GLUCOPHAGE) 1000 MG tablet, Take 1 tablet (1,000 mg total) by mouth 2 (two) times daily with a meal., Disp: 180 tablet, Rfl: 3 .  nitroGLYCERIN (NITROSTAT) 0.4 MG SL tablet, PLACE 1 TABLET UNDER THE TONGUE EVERY 5 MINUTES AS NEEDED FOR CHEST PAIN, Disp: 25 tablet, Rfl: 0 .  omeprazole (PRILOSEC) 40 MG capsule, Take 40 mg by mouth 2 (two) times daily., Disp: , Rfl:  .  PARoxetine (PAXIL) 40 MG tablet, TAKE 1 TABLET BY MOUTH EVERY MORNING, Disp: 90 tablet, Rfl: 3 .  pioglitazone (ACTOS) 15 MG tablet, Take 1 tablet (15 mg total) by mouth daily., Disp: 90 tablet, Rfl: 3 .  polyethylene glycol (MIRALAX) packet, Take 17 g by mouth daily., Disp: 14 each, Rfl: 0 .  pregabalin (LYRICA) 150 MG capsule, TAKE 1 CAPSULE(150 MG) BY MOUTH DAILY, Disp: 90 capsule, Rfl: 3 .  traMADol (ULTRAM) 50 MG tablet, , Disp: , Rfl:  .  venlafaxine XR (EFFEXOR-XR) 75 MG 24 hr capsule, Take 1 capsule (75 mg total) by mouth daily with breakfast., Disp: 90 capsule, Rfl: 3 .  vitamin B-12 (CYANOCOBALAMIN)  1000 MCG tablet, Take 1,000 mcg by mouth daily., Disp: , Rfl:   No Known Allergies  Objective: Vascular Examination: Capillary refill time immediate x 10 digits.  Dorsalis pedis pulses diminished b/l.  Posterior tibial pulses absent b/l.  Digital hair absent x 10 digits.  Skin temperature gradient WNL b/l.  Dermatological Examination: Skin with normal turgor, texture and tone b/l.  Toenails 1-5 b/l discolored, thick, dystrophic with subungual debris and pain with palpation to nailbeds due to thickness of nails.  Hyperkeratotic lesions submet head 1, 5 b/l. No erythema, no edema, no drainage, no flocculence noted.   Musculoskeletal: Muscle strength 5/5 to all LE  muscle groups.  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  No pain, crepitus or joint limitation with passive/active ROM.  Neurological: Sensation diminished with 10 gram monofilament.  Assessment: 1. Painful onychomycosis toenails 1-5 b/l 2. Calluses submet head 1, 5 b/l 3. NIDDM with Diabetic neuropathy  Plan: 1. Continue diabetic foot care principles. Literature dispensed on today. 2. Toenails 1-5 b/l were debrided in length and girth without iatrogenic bleeding. 3. Calluses pared submetatarsal heads 1, 5 b/l  utilizing sterile scalpel blade without incident. 4. Patient to continue soft, supportive shoe gear 5. Patient to report any pedal injuries to medical professional  6. Follow up 3 months.  7. Patient/POA to call should there be a concern in the interim. 

## 2019-05-24 NOTE — Patient Outreach (Signed)
[facility]) Care Management  05/24/2019  [name] [name] 1945-10-25 [id number]   [place] [facility] Outreach  Referral Date:05/09/2018 Referral Source:Self referral Reason for Referral:"Physician requested her to contact [facility] [place] [employer]   Outreach Attempt:  Outreach attempt #2 to patient for follow up. No answer and unable to leave voicemail message due to voicemail box being full..  Plan:  RN Health Coach will make another outreach attempt within the month of August.  Alletta Mattos RN [place] [phone] [email or web address]

## 2019-05-25 ENCOUNTER — Encounter: Payer: Self-pay | Admitting: Physical Therapy

## 2019-05-25 ENCOUNTER — Ambulatory Visit: Payer: Medicare Other | Admitting: Physical Therapy

## 2019-05-25 DIAGNOSIS — M6281 Muscle weakness (generalized): Secondary | ICD-10-CM | POA: Diagnosis not present

## 2019-05-25 DIAGNOSIS — G8929 Other chronic pain: Secondary | ICD-10-CM | POA: Diagnosis not present

## 2019-05-25 DIAGNOSIS — M542 Cervicalgia: Secondary | ICD-10-CM

## 2019-05-25 DIAGNOSIS — M25512 Pain in left shoulder: Secondary | ICD-10-CM | POA: Diagnosis not present

## 2019-05-25 NOTE — Therapy (Signed)
[place], [place], [address] Phone: [phone]   Fax:  [phone]  Physical Therapy Treatment/Discharge  Patient Details  Name: [name] [name] MRN: [id number] Date of Birth: Sep 16, 1946 Referring Provider (PT): Thurman Coyer, DO   Encounter Date: 05/25/2019  PT End of Session - 05/25/19 1044    Visit Number  17    Number of Visits  18    Date for PT Re-Evaluation  05/18/19   previous date 6/0/63, recert today for d/c visit   Authorization Type  [facility] MCR/MCD next progress at 47 (1st one done at visit 9), KX at 2    PT Start Time  1015    PT Stop Time  1056    PT Time Calculation (min)  41 min    Activity Tolerance  Patient tolerated treatment well    Behavior During Therapy  [facility] for tasks assessed/performed       Past Medical History:  Diagnosis Date  . Anemia   . Arthritis   . Blood transfusion without reported diagnosis   . CAD 08/26/2006   Cath 07/05:multiple areas of nonobstructive disease = medical & RF mgmt, well preserved global systolic function. Dr. Lia Foyer. Nuclear med stress test 01/2014 : Normal stress nuclear study. LV Ejection Fraction: 76%. LV Wall Motion: NL LV Function; NL Wall Motion.      . Cataract   . Chronic kidney disease   . Chronic pain syndrome 01/03/2013   Multifactorial. Non opioid requiring.   L2-5 fusion, with hardware at L2-3, stable per MRI 2010. Followed by neurosurgery (Dr. 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    [name] DEPRESSION 08/26/2006   On paxil    . DIABETES MELLITUS, TYPE II 08/26/2006   Insulin dependent. Victoza added 03/2014  . DYSLIPIDEMIA 11/01/2006   LDL goal < 100, 70 if can achieve without side effects.     . Gallstones   . GERD 08/26/2006   Heartburn QHS.     . Glaucoma   . HYPERTENSION 08/26/2006   On BB, ACEI, lasix, norvasc, metolazone, and imdur.     . OBESITY 08/26/2006    BMI 39.5. Obesity Class 2.     . PVD (peripheral vascular disease) ([place]) 03/27/2014   2015 LE arterial Duplex - Possible mild inflow disease on the left. 0-49% bilateral SFA disease, without focal stenosis. Three vesel run-off, bilaterally. Medical tx.     Past Surgical History:  Procedure Laterality Date  . CHOLECYSTECTOMY     pt denies 04/07/16  . COLONOSCOPY    . endometrial biospy    . LEFT HEART CATH    . lumbar fusion surgery  10/08   L3-L5  . POLYPECTOMY    . posterior lumbar interfusion surgery  07/18/07   L2-L3  . rotator cuff surgery Bilateral     There were no vitals filed for this visit.  Subjective Assessment - 05/25/19 1045    Subjective  Ms. Vowell reports that her pain is much better than when she started therapy but still rates her pain at 6/10 in left shoulder which per pt. is about at her baseline prior to exacerbation before starting therapy. She is in agreement with plans to d/c to HEP today.    Pertinent History  PMH: lumbar fusion 2008, chronic LBP, CAD,  Lt heart Cath, prev RTC repair 12 years ago, PVD, DM, HTN    Pain Score  6  Pain Location  Neck    Pain Orientation  Left    Pain Descriptors / Indicators  Aching;Tightness    Pain Type  Chronic pain    Pain Radiating Towards  left shoulder    Pain Onset  More than a month ago    Pain Frequency  Intermittent    Aggravating Factors   left UE uses    Pain Relieving Factors  rest, medication and modalities    Effect of Pain on Daily Activities  limits ability left UE use for ADLs and chores         [facility] PT Assessment - 05/25/19 0001      Observation/Other Assessments   Focus on Therapeutic Outcomes (FOTO)   51% limited      AROM   Left Shoulder Flexion  150 Degrees    Left Shoulder ABduction  115 Degrees    Left Shoulder Internal Rotation  --   reach to L4   Left Shoulder External Rotation  --   reach to C6   Cervical Flexion  45    Cervical Extension  45    Cervical - Right Side Bend  35  deg    Cervical - Left Side Bend  35 deg    Cervical - Right Rotation  68 deg    Cervical - Left Rotation  80 deg                   [facility] Adult PT Treatment/Exercise - 05/25/19 0001      Shoulder Exercises: Standing   External Rotation  AROM;Strengthening;Left;20 reps    Theraband Level (Shoulder External Rotation)  Level 2 (Red)    Internal Rotation  AROM;Strengthening;Left;20 reps    Theraband Level (Shoulder Internal Rotation)  Level 2 (Red)    Row  AROM;Strengthening;Both;20 reps    Theraband Level (Shoulder Row)  Level 2 (Red)    Other Standing Exercises  Theraband "punch"/[place] flexion red 2x10      Moist Heat Therapy   Number Minutes Moist Heat  10 Minutes    Moist Heat Location  Cervical;Lumbar Spine      Electrical Stimulation   Electrical Stimulation Location  neck and lumbar    Electrical Stimulation Action  IFC    Electrical Stimulation Parameters  to tolerance    Electrical Stimulation Goals  Pain      Manual Therapy   Soft tissue mobilization  Left upper trapezius and levator region in sitting      Neck Exercises: Stretches   Upper Trapezius Stretch  Left;3 reps;20 seconds    Levator Stretch  Left;3 reps;20 seconds             PT Education - 05/25/19 1044    Education Details  POC, HEP    Person(s) Educated  Patient    Methods  Explanation;Demonstration;Verbal cues    Comprehension  Verbalized understanding;Returned demonstration          PT Long Term Goals - 05/25/19 1028      PT LONG TERM GOAL #1   Title  Pt will be I and compliant with HEP. (Target goal for all goals 6 weeks 04/13/19)    Baseline  reports compliance    Time  6    Period  Weeks    Status  Achieved      PT LONG TERM GOAL #2   Title  Pt will improve ROM in neck and Left shoulder to [facility] to improve mobility    Baseline  now [facility]    Time  6    Period  Weeks    Status  Achieved      PT LONG TERM GOAL #3   Title  Pt will improve strength to at least 4/5 MMT to  improve function    Baseline  4/5s    Time  6    Period  Weeks    Status  Achieved      PT LONG TERM GOAL #4   Title  Pt will improve FOTO to less than 42 % limited to show improved function    Baseline  51%    Time  6    Period  Weeks    Status  Not Met      PT LONG TERM GOAL #5   Title  Pt will reduce pain by overall 50%    Baseline  met    Time  6    Period  Weeks    Status  Achieved            Plan - 05/25/19 1046    Clinical Impression Statement  Still with moderate pain level but pt. has met all therapy goals excepting FOTO score and feels like she is near her baseline status/PLOF so plan d/c therapy today. Pt. will continue independently with HEP and is recommended to follow up with Dr. Micheline Chapman if having any future changes in status.    Personal Factors and Comorbidities  Comorbidity 1;Comorbidity 2;Comorbidity 3+;Past/Current Experience    Comorbidities  PMH: lumbar fusion 2008, chronic LBP, CAD,  Lt heart Cath, prev RTC repair 12 years ago, PVD, DM, HTN    Examination-Activity Limitations  Lift;Dressing;Sleep;Reach Overhead;Hygiene/Grooming    Examination-Participation Restrictions  Laundry;Cleaning;Meal Prep    Stability/Clinical Decision Making  Evolving/Moderate complexity    Clinical Decision Making  Moderate    Rehab Potential  Good    PT Frequency  2x / week    PT Duration  6 weeks    PT Treatment/Interventions  ADLs/Self Care Home Management;Cryotherapy;Electrical Stimulation;Iontophoresis 73m/ml Dexamethasone;Moist Heat;Traction;Ultrasound;Therapeutic activities;Therapeutic exercise;Neuromuscular re-education;Manual techniques;Passive range of motion;Dry needling;Taping;Spinal Manipulations;Joint Manipulations    Consulted and Agree with Plan of Care  Patient       Patient will benefit from skilled therapeutic intervention in order to improve the following deficits and impairments:  Decreased endurance, Decreased activity tolerance, Decreased range of  motion, Decreased strength, Hypomobility, Impaired flexibility, Increased muscle spasms, Postural dysfunction, Pain  Visit Diagnosis: 1. Chronic left shoulder pain   2. Cervicalgia   3. Muscle weakness (generalized)        Problem List Patient Active Problem List   Diagnosis Date Noted  . Ankle swelling, left 11/03/2018  . Esophageal dysphagia 03/03/2018  . Gait instability 03/03/2018  . Aortic atherosclerosis ([place] 06/17/2017  . 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 Right carotid bruit 04/08/2017  . CKD stage 3 due to type 2 diabetes mellitus ([place] 12/11/2016  . Diabetic neuropathy ([place] 12/10/2016  . Small intestinal bacterial overgrowth 12/24/2015  . Goals of care, counseling/discussion 01/18/2015  . ASCUS with positive high risk HPV 08/03/2014  . PVD (peripheral vascular disease) ([place] 03/27/2014  . Diastolic CHF, chronic ([place] 12/21/2013  . Healthcare maintenance 04/25/2013  . Chronic pain syndrome 01/03/2013  . Insomnia 12/22/2011  . Vitamin B12 deficiency 12/27/2009  . Vitamin D deficiency 12/27/2009  . Anemia 12/27/2009  . Hypomagnesemia 12/29/2008  . Osteopenia 08/01/2008  . Hyperlipidemia 11/01/2006  . GLAUCOMA NOS 11/01/2006  . DM (diabetes mellitus), type 2,  uncontrolled, periph vascular complic ([place]) 73/34/4830  . Obesity 08/26/2006  . Depression 08/26/2006  . HTN (hypertension) 08/26/2006  . Coronary atherosclerosis 08/26/2006  . GERD 08/26/2006     PHYSICAL THERAPY DISCHARGE SUMMARY  Visits from Start of Care: 17  Current functional level related to goals / functional outcomes: See above-still with moderate pain but has improved to levels consistent with PLOF-all goals met excepting FOTO score   Remaining deficits: Left shoulder weakness   Education / Equipment: HEP, issued red Theraband  Plan: Patient agrees to discharge.  Patient goals were met. Patient is being discharged due to meeting the stated rehab goals.  ?????          Beaulah Dinning, PT, DPT 05/25/19 12:05 PM     [facility] [address] [place], [place], [address] Phone: [phone]   Fax:  [phone]  Name: THEODORE RAHRIG MRN: [id number] Date of Birth: 1946/04/14

## 2019-05-29 ENCOUNTER — Other Ambulatory Visit: Payer: Self-pay | Admitting: *Deleted

## 2019-06-06 ENCOUNTER — Encounter: Payer: Self-pay | Admitting: Internal Medicine

## 2019-06-08 ENCOUNTER — Other Ambulatory Visit: Payer: Self-pay | Admitting: *Deleted

## 2019-06-08 ENCOUNTER — Encounter: Payer: Self-pay | Admitting: *Deleted

## 2019-06-08 NOTE — Patient Outreach (Signed)
[facility]) Care Management  [place]  06/08/2019   [name] [name] [birth year] [id number]   [place] Quarterly Outreach   Referral Date:  05/09/2018 Referral Source:  Self referral Reason for Referral:  "Physician requested her to contact [facility]" Insurance:  [employer] Attempt:   Successful telephone outreach to patient for follow up.  HIPAA verified with patient.  Patient reporting she is doing well.  States she has been staying in and practicing social distance when she does go out.  Continues to monitor blood sugars about 3 times a day.  Fasting blood sugar this morning was 160 with usual fasting ranges of 90-130's.  Latest Hgb A1C is reduced to 6.4 on 05/16/2019.  Patient weighed herself while on phone and weight was 216.4 pounds.  Congratulated patient on her hard work with reducing her A1C.  Does report 1 fall in June 2020 while at outpatient rehab, states she thinks she just tripped.  Denies any injuries.  Fall precautions and preventions reviewed and discussed.  Denies any falls since.  States she has completed her outpatient therapy for her shoulder and now has a little more range of motion in that shoulder.  Encounter Medications:  Outpatient Encounter Medications as of 06/08/2019  Medication Sig Note  . Accu-Chek FastClix Lancets MISC USE THREE TIMES DAILY. 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 ACCU-CHEK GUIDE test strip TEST BLOOD SUGAR 3 TIMES DAILY   . aspirin 81 MG tablet Take 1 tablet (81 mg total) by mouth daily.   [name] atenolol (TENORMIN) 100 MG tablet Take 1 tablet (100 mg total) by mouth daily.   [name] atorvastatin (LIPITOR) 10 MG tablet TAKE 1 TABLET BY MOUTH DAILY   . benazepril (LOTENSIN) 40 MG tablet TAKE 1 TABLET(40 MG) BY MOUTH DAILY   . Blood Glucose Monitoring Suppl (ACCU-CHEK GUIDE) w/Device KIT 1 each by Does not apply route 3 (three) times daily.   . furosemide (LASIX) 40 MG tablet TAKE 1 TABLET BY MOUTH ONCE  DAILY   . Insulin Glargine (LANTUS SOLOSTAR) 100 UNIT/ML Solostar Pen Inject 12 Units into the skin daily with breakfast.   . Insulin Pen Needle (BD PEN NEEDLE NANO U/F) 32G X 4 MM MISC USE TO INJECT VICTOZA ONCE A DAY AND LANTUS ONCE A DAY AS DIRECTED   . isosorbide mononitrate (IMDUR) 30 MG 24 hr tablet TAKE 1 TABLET BY MOUTH ONCE DAILY   . liraglutide (VICTOZA) 18 MG/3ML SOPN ADMINISTER 1.2 MG UNDER THE SKIN DAILY   . loratadine (CLARITIN) 10 MG tablet Take 1 tablet (10 mg total) by mouth daily.   . magnesium chloride (SLOW-MAG) 64 MG TBEC SR tablet Take by mouth.   . Melatonin 1 MG TABS Take 1 tablet (1 mg total) by mouth at bedtime.   . metFORMIN (GLUCOPHAGE) 1000 MG tablet Take 1 tablet (1,000 mg total) by mouth 2 (two) times daily with a meal.   . nitroGLYCERIN (NITROSTAT) 0.4 MG SL tablet PLACE 1 TABLET UNDER THE TONGUE EVERY 5 MINUTES AS NEEDED FOR CHEST PAIN   . omeprazole (PRILOSEC) 40 MG capsule Take 40 mg by mouth 2 (two) times daily.   [name] PARoxetine (PAXIL) 40 MG tablet TAKE 1 TABLET BY MOUTH EVERY MORNING   . pioglitazone (ACTOS) 15 MG tablet Take 1 tablet (15 mg total) by mouth daily.   . polyethylene glycol (MIRALAX) packet Take 17 g by mouth daily.   . pregabalin (LYRICA) 150 MG capsule TAKE  1 CAPSULE(150 MG) BY MOUTH DAILY   . traMADol (ULTRAM) 50 MG tablet    . venlafaxine XR (EFFEXOR-XR) 75 MG 24 hr capsule Take 1 capsule (75 mg total) by mouth daily with breakfast.   . vitamin B-12 (CYANOCOBALAMIN) 1000 MCG tablet Take 1,000 mcg by mouth daily.   [name] amLODipine (NORVASC) 5 MG tablet Take 1 tablet (5 mg total) by mouth daily. 06/08/2019: Continues to take   No facility-administered encounter medications on file as of 06/08/2019.     Functional Status:  In your present state of health, do you have any difficulty performing the following activities: 06/08/2019 11/10/2018  Hearing? N N  Vision? N N  Difficulty concentrating or making decisions? N N  Walking or climbing stairs?  N Y  Comment ambulates with cane -  Dressing or bathing? N N  Doing errands, shopping? [name] [name]  Comment daughter does grocery shopping -  Conservation officer, nature and eating ? N -  Using the Toilet? N -  In the past six months, have you accidently leaked urine? N -  Do you have problems with loss of bowel control? N -  Managing your Medications? N -  Managing your Finances? N -  Housekeeping or managing your Housekeeping? N -  Some recent data might be hidden    Fall/Depression Screening: Fall Risk  06/08/2019 01/24/2019 11/17/2018  Falls in the past year? 1 0 0  Comment fall in outpatient therapy 03/2019 - -  Number falls in past yr: 0 - -  Injury with Fall? 0 - -  Comment - - -  Risk Factor Category  - - -  Risk for fall due to : Impaired balance/gait;History of fall(s);Medication side effect;Impaired mobility Impaired vision;Medication side effect;Impaired balance/gait Impaired balance/gait;Impaired mobility  Risk for fall due to: Comment - - -  Follow up Falls evaluation completed;Education provided;Falls prevention discussed Falls evaluation completed;Education provided;Falls prevention discussed Falls prevention discussed   PHQ 2/9 Scores 06/08/2019 11/17/2018 11/10/2018 11/03/2018 08/04/2018 06/08/2018 03/03/2018  PHQ - 2 Score 0 0 4 0 2 0 0  PHQ- 9 Score - - '15 4 8 ' - -  Exception Documentation - Patient refusal - - - - -   THN CM Care Plan Problem One     Most Recent Value  Care Plan Problem One  Knowledge deficiet related to self care management of diabetes.  Role Documenting the Problem One  [place] for Problem One  Active  [facility] Long Term Goal   Patient will maintain A1C at or below 7 in the next 90 days.  THN Long Term Goal Start Date  06/08/19  [facility] Long Term Goal Met Date  06/08/19  Interventions for Problem One Long Term Goal  Congratulated patient on reduction of A1C down to 6.4, reviewed and discussed current care plan and goals, reviewed medications and encouraged  medication compliance, encouraged to keep and attend medical appointments, encourage to continue to review diabetes educational booklet with daughter, fall precautions and preventions reviewed and discussed, encouraged to continue use of cane with all ambulation, encouraged to continue to increase activities as tolerated, discussed and encouraged diabetic heart healthy diet, reviewed current glucose readings, reviewed hypo and hyperglycemic signs and symptoms, discussed ways to reduce episodes of hypoglycemia, sending EMMI Diabetes and Diet, sending EMMI Diabetic Meal Planning , encouraged patient to have daughter assist her in reviewing educational material mailed to her  [facility] CM Short Term Goal #1   Patient will review Diabetes Educational  Booklet in the next 30 days.  THN CM Short Term Goal #1 Start Date  01/24/19  [facility] CM Short Term Goal #1 Met Date  06/08/19     Appointments:  Last attended appointment with primary care provider on 11/17/2018 and has scheduled telehealth appointment for 06/29/2019.  Plan: RN Health Coach will send primary care provider quarterly update. RN Health Coach will send patient EMMI Diabetes and Diet. RN Health Coach will send patient EMMI Diabetic Meal Planning. RN Health Coach will make next telephone outreach to patient within the month of November.  [place] [phone] [name] [name]

## 2019-06-09 ENCOUNTER — Other Ambulatory Visit: Payer: Self-pay

## 2019-06-09 MED ORDER — TRAMADOL HCL 50 MG PO TABS: 50.0000 mg | ORAL_TABLET | Freq: Two times a day (BID) | ORAL | 1 refills | Status: DC | PRN

## 2019-06-15 ENCOUNTER — Ambulatory Visit: Payer: Medicare Other | Admitting: Internal Medicine

## 2019-06-29 ENCOUNTER — Ambulatory Visit (INDEPENDENT_AMBULATORY_CARE_PROVIDER_SITE_OTHER): Payer: Medicare Other | Admitting: Internal Medicine

## 2019-06-29 ENCOUNTER — Other Ambulatory Visit: Payer: Self-pay

## 2019-06-29 DIAGNOSIS — E1142 Type 2 diabetes mellitus with diabetic polyneuropathy: Secondary | ICD-10-CM

## 2019-06-29 DIAGNOSIS — F339 Major depressive disorder, recurrent, unspecified: Secondary | ICD-10-CM | POA: Diagnosis not present

## 2019-06-29 DIAGNOSIS — E1151 Type 2 diabetes mellitus with diabetic peripheral angiopathy without gangrene: Secondary | ICD-10-CM | POA: Diagnosis not present

## 2019-06-29 DIAGNOSIS — I10 Essential (primary) hypertension: Secondary | ICD-10-CM

## 2019-06-29 DIAGNOSIS — Z794 Long term (current) use of insulin: Secondary | ICD-10-CM | POA: Diagnosis not present

## 2019-06-29 DIAGNOSIS — E1140 Type 2 diabetes mellitus with diabetic neuropathy, unspecified: Secondary | ICD-10-CM | POA: Diagnosis not present

## 2019-06-29 DIAGNOSIS — E1165 Type 2 diabetes mellitus with hyperglycemia: Secondary | ICD-10-CM

## 2019-06-29 DIAGNOSIS — Z79899 Other long term (current) drug therapy: Secondary | ICD-10-CM

## 2019-06-29 DIAGNOSIS — F320 Major depressive disorder, single episode, mild: Secondary | ICD-10-CM

## 2019-06-29 DIAGNOSIS — IMO0002 Reserved for concepts with insufficient information to code with codable children: Secondary | ICD-10-CM

## 2019-06-30 MED ORDER — LANTUS SOLOSTAR 100 UNIT/ML ~~LOC~~ SOPN: 8.0000 [IU] | PEN_INJECTOR | Freq: Every day | SUBCUTANEOUS | 3 refills | Status: DC

## 2019-06-30 MED ORDER — OMEPRAZOLE 40 MG PO CPDR: 40.0000 mg | DELAYED_RELEASE_CAPSULE | Freq: Two times a day (BID) | ORAL | 3 refills | Status: DC

## 2019-06-30 MED ORDER — ATORVASTATIN CALCIUM 10 MG PO TABS: 10.0000 mg | ORAL_TABLET | Freq: Every day | ORAL | 3 refills | Status: DC

## 2019-06-30 NOTE — Progress Notes (Signed)
   Subjective:    Patient ID: [name] [name], female    DOB: 1946-01-04, 73 y.o.   MRN: [id number]  HPI  TELEHEALTH - AUDIO only   This is a telephone encounter between [facility] and Larey Dresser on 06/29/2019 for HTN. The visit was conducted with the patient located at home and Larey Dresser at [facility]. The patient's identity was confirmed using their DOB and current address. The patient has consented to being evaluated through a telephone encounter and understands the associated risks (an examination cannot be done and the patient may need to come in for an appointment) / benefits (allows the patient to remain at home, decreasing exposure to coronavirus). I personally spent 7 minutes on medical discussion.   Dayna Ramus is here for HTN F/U. Please see the A&P for the status of the pt's chronic medical problems.  ROS : per ROS section and in problem oriented charting. All other systems are negative.  PMHx, Soc hx, and / or Fam hx : living alone   Review of Systems  Neurological: Negative for dizziness and light-headedness.  Psychiatric/Behavioral: Negative for dysphoric mood and sleep disturbance.       Objective:   Physical Exam        Assessment & Plan:

## 2019-07-03 NOTE — Assessment & Plan Note (Addendum)
This problem is chronic and well controlled.  Her prescribed medication regimen includes Lantus 8 units, metformin 1000 BID, pioglitazone 15 mg, and Victoza 1.2 daily.  She instand is taking Lantus 10 units.  Her Lantus had been decreased at a recent appointment after a CGM revealed she was having 10% hypoglycemia.  Her A1c early in August was 6.4 also supporting a high likelihood of hypoglycemia.  Today, she denies any hypoglycemia and states her lowest sugar has been 92.  I will leave medications as is and repeat her A1c at her next clinic appointment.  Due to her age and comorbidities, I would be happy with an A1c less than 8.  PLAN:  Cont current meds

## 2019-07-03 NOTE — Assessment & Plan Note (Signed)
This problem is chronic and stable.  I had changed her from Paxil to venlafaxine in October 2019 as venlafaxine can help with diabetic neuropathy.  As I was going through her medication list, Paxil was still wanted and she said yes to taking Paxil.  I then got to venlafaxine and she said yes to venlafaxine.  I did a medication review and she had picked up her paroxetine for a 90-day supply on July 30 and her venlafaxine for a 90-day supply on June 20.  It is a class C interaction for these 2 medications mostly due to serotonin syndrome.  She is getting run out of venlafaxine before she does Paxil and that she does not have great healthcare literacy, I will stop the venlafaxine as it did not improve her diabetic neuropathy and continue the Paxil.  PLAN : Stop venlafaxine and continue Paxil

## 2019-07-03 NOTE — Assessment & Plan Note (Signed)
This problem is chronic and well controlled.  I had increased her benazepril in January and her regimen is now benazepril 40, atenolol 100, amlodipine 5, and Lasix 40.  Her most recent [facility] blood pressure was 116/66 and she states that she checks her blood pressure at home and her most recent systolic was [id number] but she cannot remember the diastolic.  PLAN:  Cont current meds    BP Readings from Last 3 Encounters:  04/11/19 127/67  03/30/19 (!) 147/70  02/28/19 (!) 114/58

## 2019-07-03 NOTE — Assessment & Plan Note (Signed)
This problem is chronic and uncontrolled.  I had tapered her Paxil to off and started venlafaxine in October 2019 but she did not stop Paxil and is taking both.  She is also on Lyrica.  Since she did not get any relief from the venlafaxine, I am stopping it and continuing Lyrica as monotherapy.  Her diabetes is under great control.  PLAN : Continue pregabalin

## 2019-07-04 ENCOUNTER — Telehealth: Payer: Self-pay | Admitting: *Deleted

## 2019-07-04 ENCOUNTER — Other Ambulatory Visit: Payer: Self-pay | Admitting: Internal Medicine

## 2019-07-04 NOTE — Telephone Encounter (Signed)
Call made to pharmacy-no additional refills left on venlafaxine rx.  No further action needed. Phone call complete.Regenia Skeeter, Montay Vanvoorhis Cassady9/22/20209:14 AM

## 2019-07-04 NOTE — Telephone Encounter (Signed)
-----   Message from Bartholomew Crews, MD sent at 07/03/2019  3:12 PM EDT ----- Would you please call the pharmacy to cancel all remaining refills of venlafaxine?  She never stopped her Paxil and I do not want her on both.  Thanks

## 2019-07-05 ENCOUNTER — Telehealth: Payer: Self-pay

## 2019-07-05 NOTE — Telephone Encounter (Signed)
Attempted to call patient to schedule a pharmacy appointment to discuss medication changes and ensure patient understanding. Disconnected, unable to leave voice message.   Brenton Grills, Student-PharmD 07/05/2019 3:13 PM

## 2019-07-06 ENCOUNTER — Other Ambulatory Visit: Payer: Self-pay | Admitting: Internal Medicine

## 2019-07-06 DIAGNOSIS — Z1231 Encounter for screening mammogram for malignant neoplasm of breast: Secondary | ICD-10-CM

## 2019-07-13 ENCOUNTER — Other Ambulatory Visit: Payer: Self-pay | Admitting: Internal Medicine
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
## 2019-08-10 ENCOUNTER — Other Ambulatory Visit: Payer: Self-pay | Admitting: Internal Medicine

## 2019-08-11 NOTE — Telephone Encounter (Signed)
Pls sch Jan PCP appt. I think that is my first open Thanks

## 2019-08-14 ENCOUNTER — Other Ambulatory Visit: Payer: Self-pay | Admitting: *Deleted

## 2019-08-14 NOTE — Patient Outreach (Signed)
[facility]) Care Management  08/14/2019  [name] [name] [birth year] [id number]   [place] Quarterly Outreach  Referral Date:05/09/2018 Referral Source:Self referral Reason for Referral:"Physician requested her to contact [facility]" [place] [employer]   Outreach Attempt:  Outreach attempt #1 to patient for quarterly follow up. No answer and unable to leave voicemail message due to message center being full.  Plan:  RN Health Coach will make another outreach attempt within the month of December.  [place] Coach [phone] [name]

## 2019-08-15 ENCOUNTER — Other Ambulatory Visit: Payer: Self-pay | Admitting: *Deleted

## 2019-08-15 DIAGNOSIS — I129 Hypertensive chronic kidney disease with stage 1 through stage 4 chronic kidney disease, or unspecified chronic kidney disease: Secondary | ICD-10-CM | POA: Diagnosis not present

## 2019-08-15 DIAGNOSIS — E1165 Type 2 diabetes mellitus with hyperglycemia: Secondary | ICD-10-CM

## 2019-08-15 DIAGNOSIS — N182 Chronic kidney disease, stage 2 (mild): Secondary | ICD-10-CM | POA: Diagnosis not present

## 2019-08-15 DIAGNOSIS — E1122 Type 2 diabetes mellitus with diabetic chronic kidney disease: Secondary | ICD-10-CM | POA: Diagnosis not present

## 2019-08-15 DIAGNOSIS — N189 Chronic kidney disease, unspecified: Secondary | ICD-10-CM | POA: Diagnosis not present

## 2019-08-15 DIAGNOSIS — Z23 Encounter for immunization: Secondary | ICD-10-CM | POA: Diagnosis not present

## 2019-08-15 DIAGNOSIS — E1151 Type 2 diabetes mellitus with diabetic peripheral angiopathy without gangrene: Secondary | ICD-10-CM

## 2019-08-15 DIAGNOSIS — N179 Acute kidney failure, unspecified: Secondary | ICD-10-CM | POA: Diagnosis not present

## 2019-08-15 DIAGNOSIS — IMO0002 Reserved for concepts with insufficient information to code with codable children: Secondary | ICD-10-CM

## 2019-08-15 LAB — IRON,TIBC AND FERRITIN PANEL
%SAT: 24
Ferritin: 82
Iron: 86
TIBC: 352
UIBC: 266

## 2019-08-15 LAB — COMPREHENSIVE METABOLIC PANEL
Albumin: 4.3 (ref 3.5–5.0)
Calcium: 9.6 (ref 8.7–10.7)

## 2019-08-15 LAB — BASIC METABOLIC PANEL
BUN: 39 — AB (ref 4–21)
CO2: 26 — AB (ref 13–22)
Chloride: 99 (ref 99–108)
Creatinine: 1.3 — AB (ref 0.5–1.1)
Glucose: 199
Potassium: 4.8 (ref 3.4–5.3)
Sodium: 138 (ref 137–147)

## 2019-08-15 LAB — CBC AND DIFFERENTIAL
Hemoglobin: 11.7 — AB (ref 12.0–16.0)
Platelets: 364 (ref 150–399)
WBC: 7.5

## 2019-08-15 LAB — MICROALBUMIN, URINE: Microalb, Ur: 66

## 2019-08-15 MED ORDER — BD PEN NEEDLE NANO U/F 32G X 4 MM MISC: 5 refills | Status: DC

## 2019-08-21 ENCOUNTER — Other Ambulatory Visit: Payer: Self-pay | Admitting: Internal Medicine

## 2019-08-23 ENCOUNTER — Ambulatory Visit (INDEPENDENT_AMBULATORY_CARE_PROVIDER_SITE_OTHER): Payer: Medicare Other | Admitting: Podiatry

## 2019-08-23 ENCOUNTER — Encounter: Payer: Self-pay | Admitting: Podiatry

## 2019-08-23 ENCOUNTER — Other Ambulatory Visit: Payer: Self-pay

## 2019-08-23 DIAGNOSIS — B351 Tinea unguium: Secondary | ICD-10-CM | POA: Diagnosis not present

## 2019-08-23 DIAGNOSIS — E1142 Type 2 diabetes mellitus with diabetic polyneuropathy: Secondary | ICD-10-CM

## 2019-08-23 DIAGNOSIS — M79675 Pain in left toe(s): Secondary | ICD-10-CM | POA: Diagnosis not present

## 2019-08-23 DIAGNOSIS — E119 Type 2 diabetes mellitus without complications: Secondary | ICD-10-CM

## 2019-08-23 DIAGNOSIS — M79674 Pain in right toe(s): Secondary | ICD-10-CM | POA: Diagnosis not present

## 2019-08-23 DIAGNOSIS — L84 Corns and callosities: Secondary | ICD-10-CM

## 2019-08-23 DIAGNOSIS — Q72899 Other reduction defects of unspecified lower limb: Secondary | ICD-10-CM | POA: Diagnosis not present

## 2019-08-23 NOTE — Patient Instructions (Signed)

## 2019-08-24 ENCOUNTER — Ambulatory Visit
Admission: RE | Admit: 2019-08-24 | Discharge: 2019-08-24 | Disposition: A | Discharge status: Home / Self Care | Payer: Medicare Other | Source: Ambulatory Visit | Attending: Internal Medicine | Admitting: Internal Medicine

## 2019-08-24 DIAGNOSIS — Z1231 Encounter for screening mammogram for malignant neoplasm of breast: Secondary | ICD-10-CM

## 2019-08-28 ENCOUNTER — Other Ambulatory Visit: Payer: Self-pay | Admitting: Internal Medicine

## 2019-08-28 NOTE — Progress Notes (Signed)
Subjective: [name] [name] presents to clinic with h/o diabetic neuropathy for preventative diabetic foot care. She is seen routinely for elongated,  painful mycotic toenails and plantr calluses b/l which are aggravated when weightbearing with and without shoe gear.  This pain limits her daily activities. Pain symptoms resolve with periodic professional debridement.  Bartholomew Crews, MD is her PCP. Last visit was 06/29/2019.  She voices no new pedal concerns on today's visit.  Current Outpatient Medications on File Prior to Visit  Medication Sig Dispense Refill  . Accu-Chek FastClix Lancets MISC USE THREE TIMES DAILY. 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 ACCU-CHEK GUIDE test strip TEST BLOOD SUGAR 3 TIMES DAILY 100 strip 5  . amLODipine (NORVASC) 5 MG tablet Take 1 tablet (5 mg total) by mouth daily. 90 tablet 3  . aspirin 81 MG tablet Take 1 tablet (81 mg total) by mouth daily. 90 tablet 3  . atenolol (TENORMIN) 100 MG tablet Take 1 tablet (100 mg total) by mouth daily. 90 tablet 3  . atorvastatin (LIPITOR) 10 MG tablet Take 1 tablet (10 mg total) by mouth daily. 90 tablet 3  . benazepril (LOTENSIN) 40 MG tablet TAKE 1 TABLET(40 MG) BY MOUTH DAILY 90 tablet 3  . Blood Glucose Monitoring Suppl (ACCU-CHEK GUIDE) w/Device KIT 1 each by Does not apply route 3 (three) times daily. 1 kit 0  . furosemide (LASIX) 40 MG tablet TAKE 1 TABLET BY MOUTH ONCE DAILY 90 tablet 3  . Insulin Glargine (LANTUS SOLOSTAR) 100 UNIT/ML Solostar Pen Inject 8 Units into the skin daily with breakfast. 15 mL 3  . Insulin Pen Needle (BD PEN NEEDLE NANO U/F) 32G X 4 MM MISC USE TO INJECT VICTOZA ONCE A DAY AND LANTUS ONCE A DAY AS DIRECTED 100 each 5  . isosorbide mononitrate (IMDUR) 30 MG 24 hr tablet TAKE 1 TABLET BY MOUTH ONCE DAILY 90 tablet 3  . liraglutide (VICTOZA) 18 MG/3ML SOPN ADMINISTER 1.2 MG UNDER THE SKIN DAILY 18 mL 5  . loratadine (CLARITIN) 10 MG tablet Take 1 tablet (10 mg total) by  mouth daily. 90 tablet 3  . Melatonin 1 MG TABS Take 1 tablet (1 mg total) by mouth at bedtime. 90 tablet 3  . metFORMIN (GLUCOPHAGE) 1000 MG tablet Take 1 tablet (1,000 mg total) by mouth 2 (two) times daily with a meal. 180 tablet 3  . nitroGLYCERIN (NITROSTAT) 0.4 MG SL tablet PLACE 1 TABLET UNDER THE TONGUE EVERY 5 MINUTES AS NEEDED FOR CHEST PAIN 25 tablet 0  . omeprazole (PRILOSEC) 40 MG capsule Take 1 capsule (40 mg total) by mouth 2 (two) times daily. 180 capsule 3  . PARoxetine (PAXIL) 40 MG tablet TAKE 1 TABLET BY MOUTH EVERY MORNING 90 tablet 3  . pioglitazone (ACTOS) 15 MG tablet Take 1 tablet (15 mg total) by mouth daily. 90 tablet 3  . pregabalin (LYRICA) 150 MG capsule TAKE 1 CAPSULE(150 MG) BY MOUTH DAILY 90 capsule 3  . traMADol (ULTRAM) 50 MG tablet Take 1 tablet (50 mg total) by mouth 2 (two) times daily as needed. 60 tablet 1  . vitamin B-12 (CYANOCOBALAMIN) 1000 MCG tablet Take 1,000 mcg by mouth daily.     No current facility-administered medications on file prior to visit.      No Known Allergies   Objective: There were no vitals filed for this visit.  Physical Examination:  Vascular  Examination: Capillary refill time immediate b/l.  Faintly palpable DP pulses b/l.  Nonpalpable PT pulses b/l.  Digital hair absent b/l.  No ischemic changes evident b/l.  No edema noted b/l.  Skin temperature gradient WNL b/l.  Dermatological Examination: Skin with normal turgor, texture and tone b/l.  No open wounds b/l.  No interdigital macerations noted b/l.  Elongated, thick, discolored brittle toenails with subungual debris and pain on dorsal palpation of nailbeds 1-5 b/l.  Hyperkeratotic lesion submet heads 1, 5 b/l. No edema, no erythema, no drainage, no flocculence.   Musculoskeletal Examination: Muscle strength 5/5 to all muscle groups b/l.  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  No pain, crepitus or joint discomfort with active/passive  ROM.  Neurological Examination: Sensation diminished b/l with 10 gram monofilament.  Vibratory sensation diminished b/l.  Assessment: 1. Mycotic nail infection with pain 1-5 b/l 2. Calluses submet heads 1, 5 b/l 3. 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 Encounter for diabetic foot examination  Plan: 1. Continue diabetic foot care principles and daily foot inspection. 2. Toenails 1-5 b/l were debrided in length and girth without iatrogenic laceration.  3. Calluses pared submetatarsal heads 1, 5 b/l utilizing sterile scalpel blade without incident. 4. Continue soft, supportive shoe gear daily. 5. Report any pedal injuries to medical professional. 6. Follow up 3 months. 7. Patient/POA to call should there be a question/concern in there interim.

## 2019-09-09 ENCOUNTER — Other Ambulatory Visit: Payer: Self-pay | Admitting: Internal Medicine

## 2019-09-12 ENCOUNTER — Other Ambulatory Visit: Payer: Self-pay | Admitting: *Deleted

## 2019-09-12 DIAGNOSIS — E11319 Type 2 diabetes mellitus with unspecified diabetic retinopathy without macular edema: Secondary | ICD-10-CM

## 2019-09-12 HISTORY — DX: Type 2 diabetes mellitus with unspecified diabetic retinopathy without macular edema: E11.319

## 2019-09-12 NOTE — Patient Outreach (Signed)
[facility]) Care Management  09/12/2019  Rick Moshe 1945-12-22 [id number]   RN Health Coach Quarterly Outreach  Referral Date:05/09/2018 Referral Source:Self referral Reason for Referral:"Physician requested her to contact [facility]" [place] Medicare   Outreach Attempt:  Outreach attempt #2 to patient for follow up.  Female answered and stated patient was not home.  HIPAA compliant message left.   Plan:  RN Health Coach will make another outreach attempt within the month of January if no return call back from patient.  [place] [phone] [email or web address]

## 2019-09-19 DIAGNOSIS — H4321 Crystalline deposits in vitreous body, right eye: Secondary | ICD-10-CM | POA: Diagnosis not present

## 2019-09-19 DIAGNOSIS — H04123 Dry eye syndrome of bilateral lacrimal glands: Secondary | ICD-10-CM | POA: Diagnosis not present

## 2019-09-19 DIAGNOSIS — H10413 Chronic giant papillary conjunctivitis, bilateral: Secondary | ICD-10-CM | POA: Diagnosis not present

## 2019-09-19 DIAGNOSIS — H40053 Ocular hypertension, bilateral: Secondary | ICD-10-CM | POA: Diagnosis not present

## 2019-09-19 DIAGNOSIS — Z961 Presence of intraocular lens: Secondary | ICD-10-CM | POA: Diagnosis not present

## 2019-09-19 LAB — HM DIABETES EYE EXAM

## 2019-09-20 ENCOUNTER — Encounter: Payer: Self-pay | Admitting: *Deleted

## 2019-10-16 DIAGNOSIS — N183 Chronic kidney disease, stage 3 unspecified: Secondary | ICD-10-CM | POA: Diagnosis not present

## 2019-10-19 ENCOUNTER — Ambulatory Visit (INDEPENDENT_AMBULATORY_CARE_PROVIDER_SITE_OTHER): Payer: Medicare Other | Admitting: Internal Medicine

## 2019-10-19 ENCOUNTER — Other Ambulatory Visit: Payer: Self-pay | Admitting: Internal Medicine

## 2019-10-19 ENCOUNTER — Encounter: Payer: Self-pay | Admitting: Internal Medicine

## 2019-10-19 ENCOUNTER — Other Ambulatory Visit: Payer: Self-pay

## 2019-10-19 VITALS — BP 122/53 | HR 73 | Temp 98.4°F | Wt 216.3 lb

## 2019-10-19 DIAGNOSIS — Z7984 Long term (current) use of oral hypoglycemic drugs: Secondary | ICD-10-CM

## 2019-10-19 DIAGNOSIS — IMO0002 Reserved for concepts with insufficient information to code with codable children: Secondary | ICD-10-CM

## 2019-10-19 DIAGNOSIS — E1151 Type 2 diabetes mellitus with diabetic peripheral angiopathy without gangrene: Secondary | ICD-10-CM | POA: Diagnosis not present

## 2019-10-19 DIAGNOSIS — Z79899 Other long term (current) drug therapy: Secondary | ICD-10-CM

## 2019-10-19 DIAGNOSIS — E1165 Type 2 diabetes mellitus with hyperglycemia: Secondary | ICD-10-CM

## 2019-10-19 DIAGNOSIS — I129 Hypertensive chronic kidney disease with stage 1 through stage 4 chronic kidney disease, or unspecified chronic kidney disease: Secondary | ICD-10-CM | POA: Diagnosis not present

## 2019-10-19 DIAGNOSIS — D649 Anemia, unspecified: Secondary | ICD-10-CM

## 2019-10-19 DIAGNOSIS — E1122 Type 2 diabetes mellitus with diabetic chronic kidney disease: Secondary | ICD-10-CM

## 2019-10-19 DIAGNOSIS — E1142 Type 2 diabetes mellitus with diabetic polyneuropathy: Secondary | ICD-10-CM | POA: Diagnosis not present

## 2019-10-19 DIAGNOSIS — I10 Essential (primary) hypertension: Secondary | ICD-10-CM

## 2019-10-19 DIAGNOSIS — D508 Other iron deficiency anemias: Secondary | ICD-10-CM

## 2019-10-19 DIAGNOSIS — G894 Chronic pain syndrome: Secondary | ICD-10-CM

## 2019-10-19 DIAGNOSIS — N182 Chronic kidney disease, stage 2 (mild): Secondary | ICD-10-CM

## 2019-10-19 DIAGNOSIS — Z79891 Long term (current) use of opiate analgesic: Secondary | ICD-10-CM

## 2019-10-19 DIAGNOSIS — G4700 Insomnia, unspecified: Secondary | ICD-10-CM

## 2019-10-19 LAB — POCT GLYCOSYLATED HEMOGLOBIN (HGB A1C): Hemoglobin A1C: 7.6 % — AB (ref 4.0–5.6)

## 2019-10-19 LAB — GLUCOSE, CAPILLARY: Glucose-Capillary: 122 mg/dL — ABNORMAL HIGH (ref 70–99)

## 2019-10-19 MED ORDER — TRAMADOL HCL 50 MG PO TABS: 50.0000 mg | ORAL_TABLET | Freq: Two times a day (BID) | ORAL | 0 refills | Status: DC | PRN

## 2019-10-19 MED ORDER — PREGABALIN 150 MG PO CAPS: ORAL_CAPSULE | ORAL | 3 refills | Status: DC

## 2019-10-19 NOTE — Assessment & Plan Note (Signed)
This problem is chronic and stable.  I had intended to get a CBC, ferritin, and B12 today but she got blood work at nephrology recently so we will get those results instead.

## 2019-10-19 NOTE — Assessment & Plan Note (Signed)
This problem is chronic and controlled.  She is on Lantus 10, Metformin 1000 twice daily, Actos 15, and Victoza 1.2 weekly.  Her A1c trend has been 6.4 - 7.6 today.  She says her lowest was 52 and she was a little hungry.  Her meter download showed that she is checking about 3 times a day without any hypoglycemia and all sugars are less than 200 with her highest being 213 and her average 143.  I am relieved that she is not having any hypoglycemia as it was my concern at her last appointment.  I will continue her current medications as is and follow-up in 3 to 6 months.  PLAN:  Cont current meds

## 2019-10-19 NOTE — Assessment & Plan Note (Signed)
This problem is chronic and controlled.  She uses tramadol as needed but not every day.  She has no red flag or orange flag behavior.  She received 30 pills from sports medicine on August 28 and again on November 14, the [place] controlled substance database.  She asked me for a refill today and I have no issue providing this due to the very low risk and that she gets benefit from the medication.  PLAN : tramadol refill 30 pills to be used as needed

## 2019-10-19 NOTE — Progress Notes (Signed)
   Subjective:    Patient ID: [name] [name], female    DOB: [birth year], 74 y.o.   MRN: [id number]  HPI  [name] [name] is here for DM F/U. Please see the A&P for the status of the pt's chronic medical problems.  ROS : per ROS section and in problem oriented charting. All other systems are negative.  PMHx, Soc hx, and / or Fam hx : She lives independently.  Her daughter gets her her groceries.  Her brother passed away last year due to throat cancer.  Her friend who lives with her and contributed to the household finances also passed away last year and she is now struggling financially.  Review of Systems She has bad peripheral neuropathy in her legs up to her hips and in her hands to her wrist bilaterally Back insomnia Fatigue     Objective:   Physical Exam Constitutional:      General: She is not in acute distress.    Appearance: Normal appearance. She is obese. She is not ill-appearing.  HENT:     Head: Normocephalic and atraumatic.     Right Ear: External ear normal.     Left Ear: External ear normal.  Eyes:     General: No scleral icterus.       Right eye: No discharge.        Left eye: No discharge.     Extraocular Movements: Extraocular movements intact.     Conjunctiva/sclera: Conjunctivae normal.  Cardiovascular:     Rate and Rhythm: Normal rate and regular rhythm.     Heart sounds: Normal heart sounds.  Pulmonary:     Effort: Pulmonary effort is normal.  Abdominal:     General: Bowel sounds are normal.     Palpations: Abdomen is soft.  Musculoskeletal:        General: No swelling, tenderness, deformity or signs of injury.     Right lower leg: Edema present.     Left lower leg: Edema present.  Skin:    General: Skin is warm and dry.  Neurological:     General: No focal deficit present.     Mental Status: She is alert. Mental status is at baseline.  Psychiatric:        Mood and Affect: Mood normal.        Behavior: Behavior normal.        Thought  Content: Thought content normal.        Judgment: Judgment normal.           Assessment & Plan:

## 2019-10-19 NOTE — Assessment & Plan Note (Signed)
This problem is chronic and well controlled.  She is on benazepril 40, atenolol 100, amlodipine 5, and Lasix 40 mg.  She has no side effects to these medications.  Her blood pressure is at goal.  Dr. Arty Baumgartner, her nephrologist, recently checked labs so I will request those results.  PLAN:  Cont current meds   BP Readings from Last 3 Encounters:  10/19/19 (!) 122/53  04/11/19 127/67  03/30/19 (!) 147/70

## 2019-10-19 NOTE — Assessment & Plan Note (Signed)
This problem is chronic and stable.  Her GFR is greater than 60.  I was going to recheck it today but she states that her nephrologist just got blood work earlier this week so I will get those results instead of rechecking.  She sees her nephrologist once a year.  PLAN : obtain nephrology's blood work results

## 2019-10-19 NOTE — Patient Instructions (Signed)
1. I refilled Lyrica 2. I will call you about sleep

## 2019-10-19 NOTE — Assessment & Plan Note (Signed)
This problem is chronic and uncontrolled.  She has run out of the Lyrica in June or July and her neuropathy has gotten worse.  It started in her toes and worse approximately and is now up to her thighs bilaterally.  It is also in her hands bilaterally to her wrist.  She states it is painful and also notices weakness but her dorsiflexion and plantar flexion are 5 out of 5 bilaterally.  She has had a TSH which was normal.  Her B12 was over 2000 when it was most recently checked and she continues to take oral B12.  Her diabetes is well controlled.  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  I will resume the Lyrica at her previous dose and told her that if it does not last all day, that we could do divided dosing.  PLAN : Lyrica 150 QD

## 2019-10-23 ENCOUNTER — Other Ambulatory Visit: Payer: Self-pay | Admitting: *Deleted

## 2019-10-23 NOTE — Patient Outreach (Signed)
[facility]) Care Management  10/23/2019  [name] [name] 1946/08/23 [id number]   RN Health Coach Quarterly Outreach  Referral Date:05/09/2018 Referral Source:Self referral Reason for Referral:"Physician requested her to contact [facility]" [place] Medicare    Outreach Attempt:  Outreach attempt #3 to patient for follow up.  Patient answered and stated she was not able to talk at this time.   Plan:  RN Health Coach will make another outreach attempt within the month of February if no return callback from patient.  [place] [phone] [name]

## 2019-10-24 ENCOUNTER — Encounter: Payer: Self-pay | Admitting: Internal Medicine

## 2019-11-21 NOTE — Progress Notes (Signed)
11/22/2019 [name] [name]   November 05, 1945  [id number]  Primary Physician Bartholomew Crews, MD Primary Cardiologist: Ena Dawley, MD  Electrophysiologist: None   Reason for Visit/CC: Yearly f/u for CAD and HTN  HPI:   [name] [name] is a 74 y.o. female who presents to clinic today for routine 12 month f/u for CAD. She has a h/o non osbstructive CAD, discovered by [facility] in 2005. She was found to have multiple areas of nonobstructive disease and medical management was elected. Her most recent ischemic evaluation was by nuclear stress testing in 2015, which was negative for ischemia. Most recent 2D echo 02/2017 showed normal LVEF at 65-70%. Mild DD, mild AI, mild MR, mild TR and mildly elevated pulmonary pressure was noted. Additional PMH includes, HTN, HLD DM and PVD>>2015 LE arterial Duplex - Possible mild inflow disease on the left. 0-49% bilateral SFA disease, without focal stenosis. Three vesel run-off, bilaterally. Medical tx.  She was admitted 02/2017 to ICU for acute metabolic encephalopathy with hypovolemic shock and AKI. She required temporary intubation.  Her ACE inhibitor was discontinued due to acute kidney injury and renal function improved. Amlodipine was added in place of ACE for BP control.   11/22/2019 - the patient is coming after 1 year, she has been doing well, she remains active and has noticed worsening dyspnea on mild-to-moderate exertion.  No chest pain.  She sometimes has exertional dizziness but no falls.  She denies any palpitations or syncope.  She gets occasional lower extremity edema.  She complains of myalgias and polyarthralgias.  Current Meds  Medication Sig  . Accu-Chek FastClix Lancets MISC USE THREE TIMES DAILY. 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 ACCU-CHEK GUIDE test strip TEST BLOOD SUGAR 3 TIMES DAILY  . aspirin 81 MG tablet Take 1 tablet (81 mg total) by mouth daily.  [name] atenolol (TENORMIN) 100 MG tablet Take 1 tablet (100 mg total) by mouth daily.  [name]  atorvastatin (LIPITOR) 10 MG tablet Take 1 tablet (10 mg total) by mouth daily.  . benazepril (LOTENSIN) 40 MG tablet TAKE 1 TABLET(40 MG) BY MOUTH DAILY  . Blood Glucose Monitoring Suppl (ACCU-CHEK GUIDE) w/Device KIT 1 each by Does not apply route 3 (three) times daily.  . furosemide (LASIX) 40 MG tablet TAKE 1 TABLET BY MOUTH ONCE DAILY  . Insulin Glargine (LANTUS SOLOSTAR) 100 UNIT/ML Solostar Pen Inject 8 Units into the skin daily with breakfast.  . Insulin Pen Needle (BD PEN NEEDLE NANO U/F) 32G X 4 MM MISC USE TO INJECT VICTOZA ONCE A DAY AND LANTUS ONCE A DAY AS DIRECTED  . isosorbide mononitrate (IMDUR) 30 MG 24 hr tablet TAKE 1 TABLET BY MOUTH ONCE DAILY  . liraglutide (VICTOZA) 18 MG/3ML SOPN ADMINISTER 1.2 MG UNDER THE SKIN DAILY  . loratadine (CLARITIN) 10 MG tablet Take 1 tablet (10 mg total) by mouth daily.  . Melatonin 1 MG TABS Take 1 tablet (1 mg total) by mouth at bedtime.  . metFORMIN (GLUCOPHAGE) 1000 MG tablet Take 1 tablet (1,000 mg total) by mouth 2 (two) times daily with a meal.  . nitroGLYCERIN (NITROSTAT) 0.4 MG SL tablet PLACE 1 TABLET UNDER THE TONGUE EVERY 5 MINUTES AS NEEDED FOR CHEST PAIN  . omeprazole (PRILOSEC) 40 MG capsule Take 1 capsule (40 mg total) by mouth 2 (two) times daily.  [name] PARoxetine (PAXIL) 40 MG tablet TAKE 1 TABLET BY MOUTH EVERY MORNING  . pioglitazone (ACTOS) 15 MG tablet Take 1 tablet (15 mg total) by mouth  daily.  . pregabalin (LYRICA) 150 MG capsule TAKE 1 CAPSULE(150 MG) BY MOUTH DAILY  . traMADol (ULTRAM) 50 MG tablet Take 1 tablet (50 mg total) by mouth 2 (two) times daily as needed.  . vitamin B-12 (CYANOCOBALAMIN) 1000 MCG tablet Take 1,000 mcg by mouth daily.   No Known Allergies Past Medical History:  Diagnosis Date  . Anemia   . Arthritis   . Blood transfusion without reported diagnosis   . CAD 08/26/2006   Cath 07/05:multiple areas of nonobstructive disease = medical & RF mgmt, well preserved global systolic function. Dr.  Lia Foyer. Nuclear med stress test 01/2014 : Normal stress nuclear study. LV Ejection Fraction: 76%. LV Wall Motion: NL LV Function; NL Wall Motion.      . Cataract   . Chronic kidney disease   . Chronic pain syndrome 01/03/2013   Multifactorial. Non opioid requiring.   L2-5 fusion, with hardware at L2-3, stable per MRI 2010. Followed by neurosurgery (Dr. 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    [name] DEPRESSION 08/26/2006   On paxil    . DIABETES MELLITUS, TYPE II 08/26/2006   Insulin dependent. Victoza added 03/2014  . DYSLIPIDEMIA 11/01/2006   LDL goal < 100, 70 if can achieve without side effects.     . Gallstones   . GERD 08/26/2006   Heartburn QHS.     . Glaucoma   . HYPERTENSION 08/26/2006   On BB, ACEI, lasix, norvasc, metolazone, and imdur.     . OBESITY 08/26/2006   BMI 39.5. Obesity Class 2.     . PVD (peripheral vascular disease) ([place]) 03/27/2014   2015 LE arterial Duplex - Possible mild inflow disease on the left. 0-49% bilateral SFA disease, without focal stenosis. Three vesel run-off, bilaterally. Medical tx.    Family History  Problem Relation Age of Onset  . Cirrhosis Mother   . Diabetes Father   . Hypertension Father   . Hypertension Sister   . Diabetes Sister   . Hyperlipidemia Sister   . Colon cancer Neg Hx   . Throat cancer Neg Hx   . Esophageal cancer Neg Hx   . Rectal cancer Neg Hx   . Stomach cancer Neg Hx   . Colon polyps Neg Hx    Past Surgical History:  Procedure Laterality Date  . CHOLECYSTECTOMY     pt denies 04/07/16  . COLONOSCOPY    . endometrial biospy    . LEFT HEART CATH    . lumbar fusion surgery  10/08   L3-L5  . POLYPECTOMY    . posterior lumbar interfusion surgery  07/18/07   L2-L3  . rotator cuff surgery Bilateral    Social History   Socioeconomic History  . Marital status: Single    Spouse name: Not on file  . Number of children: 2  . Years of education: Not on  file  . Highest education level: Not on file  Occupational History  . Not on file  Tobacco Use  . Smoking status: Passive Smoke Exposure - Never Smoker  . Smokeless tobacco: Never Used  Substance and Sexual Activity  . Alcohol use: No    Alcohol/week: 0.0 standard drinks  . Drug use: No  . Sexual activity: Not on file  Other Topics Concern  . 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 Has two daughters and several grand kids. Severe limitation in  ambulation. Occ goes out to church but usually daughter gets groceries.    She is one of 7 kids and the oldest so helped raise the other 6.   Social Determinants of Health   Financial Resource Strain: Low Risk   . Difficulty of Paying Living Expenses: Not hard at all  Food Insecurity: No Food Insecurity  . Worried About Charity fundraiser in the Last Year: Never true  . Ran Out of Food in the Last Year: Never true  Transportation Needs: No Transportation Needs  . Lack of Transportation (Medical): No  . Lack of Transportation (Non-Medical): No  Physical Activity: Insufficiently Active  . Days of Exercise per Week: 3 days  . Minutes of Exercise per Session: 20 min  Stress: Stress Concern Present  . Feeling of Stress : To some extent  Social Connections:   . Frequency of Communication with Friends and Family: Not on file  . Frequency of Social Gatherings with Friends and Family: Not on file  . Attends Religious Services: Not on file  . Active Member of Clubs or Organizations: Not on file  . Attends Archivist Meetings: Not on file  . Marital Status: Not on file  Intimate Partner Violence: Not At Risk  . Fear of Current or Ex-Partner: No  . Emotionally Abused: No  . Physically Abused: No  . Sexually Abused: No    Lipid Panel     Component Value Date/Time   CHOL 111 12/06/2018 1020   TRIG 68 12/06/2018 1020   HDL 50 12/06/2018 1020   CHOLHDL 2.2 12/06/2018 1020   CHOLHDL 1.9  12/30/2015 0832   VLDL 12 12/30/2015 0832   LDLCALC 47 12/06/2018 1020   Review of Systems: General: negative for chills, fever, night sweats or weight changes.  Cardiovascular: negative for chest pain, dyspnea on exertion, edema, orthopnea, palpitations, paroxysmal nocturnal dyspnea or shortness of breath Dermatological: negative for rash Respiratory: negative for cough or wheezing Urologic: negative for hematuria Abdominal: negative for nausea, vomiting, diarrhea, bright red blood per rectum, melena, or hematemesis Neurologic: negative for visual changes, syncope, or dizziness All other systems reviewed and are otherwise negative except as noted above.  Physical Exam:  Blood pressure 124/68, pulse 74, height '4\' 6"'  (1.372 m), weight 213 lb 12.8 oz (97 kg), SpO2 99 %.  General appearance: alert, cooperative, no distress and morbidly obese Neck: no carotid bruit and no JVD Lungs: clear to auscultation bilaterally Heart: regular rate and rhythm, S1, S2 normal, no murmur, click, rub or gallop Extremities: extremities normal, atraumatic, no cyanosis or edema Pulses: 2+ and symmetric Skin: Skin color, texture, turgor normal. No rashes or lesions Neurologic: Alert and oriented X 3, normal strength and tone. Normal symmetric reflexes. Normal coordination and gait  EKG -shows normal sinus rhythm, normal EKG, unchanged from prior, this was personally reviewed.     ASSESSMENT AND PLAN:   1. CAD: h/o non obstructive CAD, echo 02/2017 showed normal LVEF at 65-70%.   She now has worsening dyspnea on exertion, will obtain Lexiscan nuclear stress test.  2. HTN:  Controlled on current regimen.  No orthostatic hypotension with 1 fall 7 months ago, will continue same management for now.  3. HLD: on statin therapy with Lipitor. Most recent lipids in 11/2018: LDL 47, HDL 50, TG: 68.  She is complaining of myalgias and polyarthralgias, we will switch to rosuvastatin 10 mg daily.  4. DM: on insulin.  Followed by PCP.   5. PVD: 2015 LE  arterial Duplex - Possible mild inflow disease on the left. 0-49% bilateral SFA disease, without focal stenosis. Three vesel run-off, bilaterally. Medical tx. Denies claudication. Continue ASA and statin.   6. Chronic Diastolic Dysfunction: most recent 2D echo 02/2017 showed normal LVEF and mild DD. Euvolemic on physical exam.  l.  7. Valvular Disease: mild. Most recent 2D echo 02/2017 showed mild AL, mild MR and mild TR.  She denies dyspnea.  No significant murmurs noted on auscultation today.  Follow-Up Dr. Meda Coffee in 3 months.  Ena Dawley, MD [facility] 11/22/2019 9:25 AM

## 2019-11-22 ENCOUNTER — Encounter: Payer: Self-pay | Admitting: Cardiology

## 2019-11-22 ENCOUNTER — Ambulatory Visit (INDEPENDENT_AMBULATORY_CARE_PROVIDER_SITE_OTHER): Payer: Medicare Other | Admitting: Cardiology

## 2019-11-22 ENCOUNTER — Other Ambulatory Visit: Payer: Self-pay

## 2019-11-22 ENCOUNTER — Encounter: Payer: Self-pay | Admitting: *Deleted

## 2019-11-22 VITALS — BP 124/68 | HR 74 | Ht <= 58 in | Wt 213.8 lb

## 2019-11-22 DIAGNOSIS — R0609 Other forms of dyspnea: Secondary | ICD-10-CM

## 2019-11-22 DIAGNOSIS — R0600 Dyspnea, unspecified: Secondary | ICD-10-CM | POA: Diagnosis not present

## 2019-11-22 DIAGNOSIS — R0602 Shortness of breath: Secondary | ICD-10-CM | POA: Diagnosis not present

## 2019-11-22 DIAGNOSIS — I10 Essential (primary) hypertension: Secondary | ICD-10-CM | POA: Diagnosis not present

## 2019-11-22 DIAGNOSIS — I2510 Atherosclerotic heart disease of native coronary artery without angina pectoris: Secondary | ICD-10-CM

## 2019-11-22 DIAGNOSIS — I739 Peripheral vascular disease, unspecified: Secondary | ICD-10-CM | POA: Diagnosis not present

## 2019-11-22 DIAGNOSIS — H5213 Myopia, bilateral: Secondary | ICD-10-CM | POA: Diagnosis not present

## 2019-11-22 DIAGNOSIS — E782 Mixed hyperlipidemia: Secondary | ICD-10-CM

## 2019-11-22 MED ORDER — ROSUVASTATIN CALCIUM 10 MG PO TABS: 10.0000 mg | ORAL_TABLET | Freq: Every day | ORAL | 1 refills | Status: DC

## 2019-11-22 NOTE — Patient Instructions (Addendum)
Medication Instructions:   STOP TAKING ATORVASTATIN NOW  START TAKING ROSUVASTATIN 10 MG BY MOUTH DAILY  *If you need a refill on your cardiac medications before your next appointment, please call your pharmacy*    Testing/Procedures:  Your physician has requested that you have a lexiscan myoview. For further information please visit [email or web address]. Please follow instruction sheet, as given. DO ON D-SPECT PER DR. Meda Coffee   Follow-Up: At [facility], you and your health needs are our priority.  As part of our continuing mission to provide you with exceptional heart care, we have created designated Provider Care Teams.  These Care Teams include your primary Cardiologist (physician) and Advanced Practice Providers (APPs -  Physician Assistants and Nurse Practitioners) who all work together to provide you with the care you need, when you need it.  Your next appointment:   3 month(s)  The format for your next appointment:   In Person  Provider:   Ena Dawley, MD

## 2019-11-24 ENCOUNTER — Ambulatory Visit: Payer: Medicare Other | Admitting: Podiatry

## 2019-11-28 ENCOUNTER — Other Ambulatory Visit: Payer: Self-pay | Admitting: *Deleted

## 2019-11-28 NOTE — Patient Outreach (Signed)
[place] [facility]) Care Management  11/28/2019  Meliah Drolet 09/08/1946 [id number]   RN Health Coach Quarterly Outreach  Referral Date:05/09/2018 Referral Source:Self referral Reason for Referral:"Physician requested her to contact [facility]" [place] Medicare   Outreach Attempt:  Outreach attempt #4 to patient for follow up. No answer. RN Health Coach left HIPAA compliant voicemail message along with contact information.  Plan:  RN Health Coach will send unsuccessful outreach letter to patient.  RN Health Coach will make another outreach attempt to patient within the month of March if no return call back from patient.  [place] [phone] [email or web address]

## 2019-12-01 ENCOUNTER — Other Ambulatory Visit: Payer: Self-pay | Admitting: *Deleted

## 2019-12-01 MED ORDER — NITROGLYCERIN 0.4 MG SL SUBL: SUBLINGUAL_TABLET | SUBLINGUAL | 0 refills | Status: DC

## 2019-12-04 ENCOUNTER — Other Ambulatory Visit: Payer: Self-pay | Admitting: Internal Medicine

## 2019-12-04 ENCOUNTER — Other Ambulatory Visit: Payer: Self-pay | Admitting: Cardiology

## 2019-12-04 ENCOUNTER — Telehealth (HOSPITAL_COMMUNITY): Payer: Self-pay | Admitting: *Deleted

## 2019-12-04 NOTE — Telephone Encounter (Signed)
Patient given detailed instructions per Myocardial Perfusion Study Information Sheet for the test on 12/06/19 . Patient notified to arrive 15 minutes early and that it is imperative to arrive on time for appointment to keep from having the test rescheduled.  If you need to cancel or reschedule your appointment, please call the office within 24 hours of your appointment. . Patient verbalized understanding. Kirstie Peri

## 2019-12-06 ENCOUNTER — Ambulatory Visit (HOSPITAL_COMMUNITY): Payer: Medicare Other | Attending: Cardiology

## 2019-12-06 ENCOUNTER — Other Ambulatory Visit: Payer: Self-pay

## 2019-12-06 DIAGNOSIS — R0609 Other forms of dyspnea: Secondary | ICD-10-CM

## 2019-12-06 DIAGNOSIS — R0602 Shortness of breath: Secondary | ICD-10-CM

## 2019-12-06 DIAGNOSIS — R0600 Dyspnea, unspecified: Secondary | ICD-10-CM

## 2019-12-06 MED ORDER — TECHNETIUM TC 99M TETROFOSMIN IV KIT
32.7000 | PACK | Freq: Once | INTRAVENOUS | Status: AC | PRN
  Administered 2019-12-06: 32.7 via INTRAVENOUS
  Filled 2019-12-06: qty 33

## 2019-12-06 MED ORDER — REGADENOSON 0.4 MG/5ML IV SOLN
0.4000 mg | Freq: Once | INTRAVENOUS | Status: AC
  Administered 2019-12-06: 0.4 mg via INTRAVENOUS

## 2019-12-12 ENCOUNTER — Other Ambulatory Visit: Payer: Self-pay

## 2019-12-12 ENCOUNTER — Ambulatory Visit (HOSPITAL_COMMUNITY): Payer: Medicare Other | Attending: Cardiology

## 2019-12-12 LAB — MYOCARDIAL PERFUSION IMAGING
LV dias vol: 42 mL (ref 46–106)
LV sys vol: 9 mL
Peak HR: 88 {beats}/min
Rest HR: 71 {beats}/min
SDS: 0
SRS: 2
SSS: 0
TID: 1

## 2019-12-12 MED ORDER — TECHNETIUM TC 99M TETROFOSMIN IV KIT
31.7000 | PACK | Freq: Once | INTRAVENOUS | Status: AC | PRN
  Administered 2019-12-12: 31.7 via INTRAVENOUS
  Filled 2019-12-12: qty 32

## 2020-01-02 ENCOUNTER — Other Ambulatory Visit: Payer: Self-pay | Admitting: Internal Medicine

## 2020-01-02 DIAGNOSIS — H524 Presbyopia: Secondary | ICD-10-CM | POA: Diagnosis not present

## 2020-01-02 NOTE — Telephone Encounter (Signed)
Pls sch April, May, or June appt PCP DM FU

## 2020-01-04 ENCOUNTER — Other Ambulatory Visit: Payer: Self-pay | Admitting: *Deleted

## 2020-01-04 NOTE — Patient Outreach (Signed)
[facility]) Care Management  01/04/2020  [name] [name] 06-21-1946 [id number]   RN Health Coach Quarterly Outreach  Referral Date:05/09/2018 Referral Source:Self referral Reason for Referral:"Physician requested her to contact [facility]" [place] [employer]   Outreach Attempt:  Outreach attempt #5 to patient for follow up. No answer and unable to leave voicemail message due to voicemail box being full.  Plan:  RN Health Coach will make another outreach attempt within the month of April if no return call back from patient.  [place] [phone] [email or web address]

## 2020-01-06 ENCOUNTER — Other Ambulatory Visit: Payer: Self-pay | Admitting: Internal Medicine

## 2020-01-08 NOTE — Telephone Encounter (Signed)
April, May, or June DM PCP appt pls

## 2020-01-10 ENCOUNTER — Ambulatory Visit (INDEPENDENT_AMBULATORY_CARE_PROVIDER_SITE_OTHER): Payer: Medicare Other | Admitting: Podiatry

## 2020-01-10 ENCOUNTER — Encounter: Payer: Self-pay | Admitting: Podiatry

## 2020-01-10 ENCOUNTER — Other Ambulatory Visit: Payer: Self-pay

## 2020-01-10 VITALS — Temp 96.4°F

## 2020-01-10 DIAGNOSIS — E1142 Type 2 diabetes mellitus with diabetic polyneuropathy: Secondary | ICD-10-CM | POA: Diagnosis not present

## 2020-01-10 DIAGNOSIS — M79674 Pain in right toe(s): Secondary | ICD-10-CM

## 2020-01-10 DIAGNOSIS — M79675 Pain in left toe(s): Secondary | ICD-10-CM | POA: Diagnosis not present

## 2020-01-10 DIAGNOSIS — L84 Corns and callosities: Secondary | ICD-10-CM

## 2020-01-10 DIAGNOSIS — B351 Tinea unguium: Secondary | ICD-10-CM | POA: Diagnosis not present

## 2020-01-10 DIAGNOSIS — Q72899 Other reduction defects of unspecified lower limb: Secondary | ICD-10-CM

## 2020-01-10 NOTE — Patient Instructions (Signed)
Peripheral Neuropathy Peripheral neuropathy is a type of nerve damage. It affects nerves that carry signals between the spinal cord and the arms, legs, and the rest of the body (peripheral nerves). It does not affect nerves in the spinal cord or brain. In peripheral neuropathy, one nerve or a group of nerves may be damaged. Peripheral neuropathy is a broad category that includes many specific nerve disorders, like diabetic neuropathy, hereditary neuropathy, and carpal tunnel syndrome. What are the causes? This condition may be caused by:  Diabetes. This is the most common cause of peripheral neuropathy.  Nerve injury.  Pressure or stress on a nerve that lasts a long time.  Lack (deficiency) of B vitamins. This can result from alcoholism, poor diet, or a restricted diet.  Infections.  Autoimmune diseases, such as rheumatoid arthritis and systemic lupus erythematosus.  Nerve diseases that are passed from parent to child (inherited).  Some medicines, such as cancer medicines (chemotherapy).  Poisonous (toxic) substances, such as lead and mercury.  Too little blood flowing to the legs.  Kidney disease.  Thyroid disease. In some cases, the cause of this condition is not known. What are the signs or symptoms? Symptoms of this condition depend on which of your nerves is damaged. Common symptoms include:  Loss of feeling (numbness) in the feet, hands, or both.  Tingling in the feet, hands, or both.  Burning pain.  Very sensitive skin.  Weakness.  Not being able to move a part of the body (paralysis).  Muscle twitching.  Clumsiness or poor coordination.  Loss of balance.  Not being able to control your bladder.  Feeling dizzy.  Sexual problems. How is this diagnosed? Diagnosing and finding the cause of peripheral neuropathy can be difficult. Your health care provider will take your medical history and do a physical exam. A neurological exam will also be done. This  involves checking things that are affected by your brain, spinal cord, and nerves (nervous system). For example, your health care provider will check your reflexes, how you move, and what you can feel. You may have other tests, such as:  Blood tests.  Electromyogram (EMG) and nerve conduction tests. These tests check nerve function and how well the nerves are controlling the muscles.  Imaging tests, such as CT scans or MRI to rule out other causes of your symptoms.  Removing a small piece of nerve to be examined in a lab (nerve biopsy). This is rare.  Removing and examining a small amount of the fluid that surrounds the brain and spinal cord (lumbar puncture). This is rare. How is this treated? Treatment for this condition may involve:  Treating the underlying cause of the neuropathy, such as diabetes, kidney disease, or vitamin deficiencies.  Stopping medicines that can cause neuropathy, such as chemotherapy.  Medicine to relieve pain. Medicines may include: ? Prescription or over-the-counter pain medicine. ? Antiseizure medicine. ? Antidepressants. ? Pain-relieving patches that are applied to painful areas of skin.  Surgery to relieve pressure on a nerve or to destroy a nerve that is causing pain.  Physical therapy to help improve movement and balance.  Devices to help you move around (assistive devices). Follow these instructions at home: Medicines  Take over-the-counter and prescription medicines only as told by your health care provider. Do not take any other medicines without first asking your health care provider.  Do not drive or use heavy machinery while taking prescription pain medicine. Lifestyle   Do not use any products that contain nicotine   or tobacco, such as cigarettes and e-cigarettes. Smoking keeps blood from reaching damaged nerves. If you need help quitting, ask your health care provider.  Avoid or limit alcohol. Too much alcohol can cause a vitamin B  deficiency, and vitamin B is needed for healthy nerves.  Eat a healthy diet. This includes: ? Eating foods that are high in fiber, such as fresh fruits and vegetables, whole grains, and beans. ? Limiting foods that are high in fat and processed sugars, such as fried or sweet foods. General instructions   If you have diabetes, work closely with your health care provider to keep your blood sugar under control.  If you have numbness in your feet: ? Check every day for signs of injury or infection. Watch for redness, warmth, and swelling. ? Wear padded socks and comfortable shoes. These help protect your feet.  Develop a good support system. Living with peripheral neuropathy can be stressful. Consider talking with a mental health specialist or joining a support group.  Use assistive devices and attend physical therapy as told by your health care provider. This may include using a walker or a cane.  Keep all follow-up visits as told by your health care provider. This is important. Contact a health care provider if:  You have new signs or symptoms of peripheral neuropathy.  You are struggling emotionally from dealing with peripheral neuropathy.  Your pain is not well-controlled. Get help right away if:  You have an injury or infection that is not healing normally.  You develop new weakness in an arm or leg.  You fall frequently. Summary  Peripheral neuropathy is when the nerves in the arms, or legs are damaged, resulting in numbness, weakness, or pain.  There are many causes of peripheral neuropathy, including diabetes, pinched nerves, vitamin deficiencies, autoimmune disease, and hereditary conditions.  Diagnosing and finding the cause of peripheral neuropathy can be difficult. Your health care provider will take your medical history, do a physical exam, and do tests, including blood tests and nerve function tests.  Treatment involves treating the underlying cause of the  neuropathy and taking medicines to help control pain. Physical therapy and assistive devices may also help. This information is not intended to replace advice given to you by your health care provider. Make sure you discuss any questions you have with your health care provider. Document Revised: 09/10/2017 Document Reviewed: 12/07/2016 Elsevier Patient Education  2020 Elsevier Inc.  Diabetes Mellitus and Foot Care Foot care is an important part of your health, especially when you have diabetes. Diabetes may cause you to have problems because of poor blood flow (circulation) to your feet and legs, which can cause your skin to:  Become thinner and drier.  Break more easily.  Heal more slowly.  Peel and crack. You may also have nerve damage (neuropathy) in your legs and feet, causing decreased feeling in them. This means that you may not notice minor injuries to your feet that could lead to more serious problems. Noticing and addressing any potential problems early is the best way to prevent future foot problems. How to care for your feet Foot hygiene  Wash your feet daily with warm water and mild soap. Do not use hot water. Then, pat your feet and the areas between your toes until they are completely dry. Do not soak your feet as this can dry your skin.  Trim your toenails straight across. Do not dig under them or around the cuticle. File the edges of your   nails with an emery board or nail file.  Apply a moisturizing lotion or petroleum jelly to the skin on your feet and to dry, brittle toenails. Use lotion that does not contain alcohol and is unscented. Do not apply lotion between your toes. Shoes and socks  Wear clean socks or stockings every day. Make sure they are not too tight. Do not wear knee-high stockings since they may decrease blood flow to your legs.  Wear shoes that fit properly and have enough cushioning. Always look in your shoes before you put them on to be sure there are  no objects inside.  To break in new shoes, wear them for just a few hours a day. This prevents injuries on your feet. Wounds, scrapes, corns, and calluses  Check your feet daily for blisters, cuts, bruises, sores, and redness. If you cannot see the bottom of your feet, use a mirror or ask someone for help.  Do not cut corns or calluses or try to remove them with medicine.  If you find a minor scrape, cut, or break in the skin on your feet, keep it and the skin around it clean and dry. You may clean these areas with mild soap and water. Do not clean the area with peroxide, alcohol, or iodine.  If you have a wound, scrape, corn, or callus on your foot, look at it several times a day to make sure it is healing and not infected. Check for: ? Redness, swelling, or pain. ? Fluid or blood. ? Warmth. ? Pus or a bad smell. General instructions  Do not cross your legs. This may decrease blood flow to your feet.  Do not use heating pads or hot water bottles on your feet. They may burn your skin. If you have lost feeling in your feet or legs, you may not know this is happening until it is too late.  Protect your feet from hot and cold by wearing shoes, such as at the beach or on hot pavement.  Schedule a complete foot exam at least once a year (annually) or more often if you have foot problems. If you have foot problems, report any cuts, sores, or bruises to your health care provider immediately. Contact a health care provider if:  You have a medical condition that increases your risk of infection and you have any cuts, sores, or bruises on your feet.  You have an injury that is not healing.  You have redness on your legs or feet.  You feel burning or tingling in your legs or feet.  You have pain or cramps in your legs and feet.  Your legs or feet are numb.  Your feet always feel cold.  You have pain around a toenail. Get help right away if:  You have a wound, scrape, corn, or callus  on your foot and: ? You have pain, swelling, or redness that gets worse. ? You have fluid or blood coming from the wound, scrape, corn, or callus. ? Your wound, scrape, corn, or callus feels warm to the touch. ? You have pus or a bad smell coming from the wound, scrape, corn, or callus. ? You have a fever. ? You have a red line going up your leg. Summary  Check your feet every day for cuts, sores, red spots, swelling, and blisters.  Moisturize feet and legs daily.  Wear shoes that fit properly and have enough cushioning.  If you have foot problems, report any cuts, sores, or bruises to   your health care provider immediately.  Schedule a complete foot exam at least once a year (annually) or more often if you have foot problems. This information is not intended to replace advice given to you by your health care provider. Make sure you discuss any questions you have with your health care provider. Document Revised: 06/21/2019 Document Reviewed: 10/30/2016 Elsevier Patient Education  [place] are small areas of thickened skin that occur on the top, sides, or tip of a toe. They contain a cone-shaped core with a point that can press on a nerve below. This causes pain.  Calluses are areas of thickened skin that can occur anywhere on the body, including the hands, fingers, palms, soles of the feet, and heels. Calluses are usually larger than corns. What are the causes? Corns and calluses are caused by rubbing (friction) or pressure, such as from shoes that are too tight or do not fit properly. What increases the risk? Corns are more likely to develop in people who have misshapen toes (toe deformities), such as hammer toes. Calluses can occur with friction to any area of the skin. They are more likely to develop in people who:  Work with their hands.  Wear shoes that fit poorly, are too tight, or are high-heeled.  Have toe deformities. What are the signs  or symptoms? Symptoms of a corn or callus include:  A hard growth on the skin.  Pain or tenderness under the skin.  Redness and swelling.  Increased discomfort while wearing tight-fitting shoes, if your feet are affected. If a corn or callus becomes infected, symptoms may include:  Redness and swelling that gets worse.  Pain.  Fluid, blood, or pus draining from the corn or callus. How is this diagnosed? Corns and calluses may be diagnosed based on your symptoms, your medical history, and a physical exam. How is this treated? Treatment for corns and calluses may include:  Removing the cause of the friction or pressure. This may involve: ? Changing your shoes. ? Wearing shoe inserts (orthotics) or other protective layers in your shoes, such as a corn pad. ? Wearing gloves.  Applying medicine to the skin (topical medicine) to help soften skin in the hardened, thickened areas.  Removing layers of dead skin with a file to reduce the size of the corn or callus.  Removing the corn or callus with a scalpel or laser.  Taking antibiotic medicines, if your corn or callus is infected.  Having surgery, if a toe deformity is the cause. Follow these instructions at home:   Take over-the-counter and prescription medicines only as told by your health care provider.  If you were prescribed an antibiotic, take it as told by your health care provider. Do not stop taking it even if your condition starts to improve.  Wear shoes that fit well. Avoid wearing high-heeled shoes and shoes that are too tight or too loose.  Wear any padding, protective layers, gloves, or orthotics as told by your health care provider.  Soak your hands or feet and then use a file or pumice stone to soften your corn or callus. Do this as told by your health care provider.  Check your corn or callus every day for symptoms of infection. Contact a health care provider if you:  Notice that your symptoms do not  improve with treatment.  Have redness or swelling that gets worse.  Notice that your corn or callus becomes painful.  Have fluid, blood,  or pus coming from your corn or callus.  Have new symptoms. Summary  Corns are small areas of thickened skin that occur on the top, sides, or tip of a toe.  Calluses are areas of thickened skin that can occur anywhere on the body, including the hands, fingers, palms, and soles of the feet. Calluses are usually larger than corns.  Corns and calluses are caused by rubbing (friction) or pressure, such as from shoes that are too tight or do not fit properly.  Treatment may include wearing any padding, protective layers, gloves, or orthotics as told by your health care provider. This information is not intended to replace advice given to you by your health care provider. Make sure you discuss any questions you have with your health care provider. Document Revised: 01/18/2019 Document Reviewed: 08/11/2017 Elsevier Patient Education  2020 [employer].

## 2020-01-10 NOTE — Progress Notes (Signed)
Subjective: Moneeka Rayon presents today for follow up of at risk foot care with history of diabetic neuropathy and callus(es) of both feet and painful mycotic toenails b/l that are difficult to trim. Pain interferes with ambulation. Aggravating factors include wearing enclosed shoe gear. Pain is relieved with periodic professional debridement.   She voices no new pedal problems on today's visit.  No Known Allergies   Objective: Vitals:   01/10/20 0909  Temp: (!) 96.4 F (35.8 C)    Pt 74 y.o. year old AA female, obese,  in NAD. AAO x 3.   Vascular Examination:  Capillary refill time to digits immediate b/l. Palpable DP pulses b/l. Nonpalpable PT pulses b/l. Pedal hair absent b/l Skin temperature gradient within normal limits b/l.  Dermatological Examination: Pedal skin with normal turgor, texture and tone bilaterally. No open wounds bilaterally. No interdigital macerations bilaterally. Toenails 1-5 b/l elongated, dystrophic, thickened, crumbly with subungual debris and tenderness to dorsal palpation.  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  Neurological: Protective sensation diminished with 10g monofilament b/l. Vibratory sensation absent b/l  Assessment: 1. Pain due to onychomycosis of toenails of both feet   2. Callus   3. Brachymetatarsia   4. Diabetic peripheral neuropathy associated with type 2 diabetes mellitus ([place])    Plan: -Continue diabetic foot care principles. Literature dispensed on today.  -Toenails 1-5 b/l were debrided in length and girth with sterile nail nippers and dremel without iatrogenic bleeding.  -Callus(es) submet head 1 left foot, submet head 1 right foot, submet head 5 left foot and submet head 5 right foot were debrided without complication or incident. Total  number debrided =4. -Patient to continue soft, supportive shoe gear daily. -Patient to report any pedal injuries to medical professional immediately. -Patient/POA to call should there be question/concern in the interim.  Return in about 3 months (around 04/10/2020) for diabetic nail and callus trim.

## 2020-01-11 ENCOUNTER — Ambulatory Visit (INDEPENDENT_AMBULATORY_CARE_PROVIDER_SITE_OTHER): Payer: Medicare Other | Admitting: Internal Medicine

## 2020-01-11 VITALS — HR 71 | Wt 224.1 lb

## 2020-01-11 DIAGNOSIS — IMO0002 Reserved for concepts with insufficient information to code with codable children: Secondary | ICD-10-CM

## 2020-01-11 DIAGNOSIS — E1140 Type 2 diabetes mellitus with diabetic neuropathy, unspecified: Secondary | ICD-10-CM

## 2020-01-11 DIAGNOSIS — G4700 Insomnia, unspecified: Secondary | ICD-10-CM

## 2020-01-11 DIAGNOSIS — Z794 Long term (current) use of insulin: Secondary | ICD-10-CM

## 2020-01-11 DIAGNOSIS — G894 Chronic pain syndrome: Secondary | ICD-10-CM

## 2020-01-11 DIAGNOSIS — M7918 Myalgia, other site: Secondary | ICD-10-CM

## 2020-01-11 DIAGNOSIS — E1142 Type 2 diabetes mellitus with diabetic polyneuropathy: Secondary | ICD-10-CM

## 2020-01-11 DIAGNOSIS — D508 Other iron deficiency anemias: Secondary | ICD-10-CM

## 2020-01-11 DIAGNOSIS — D649 Anemia, unspecified: Secondary | ICD-10-CM

## 2020-01-11 DIAGNOSIS — Z79899 Other long term (current) drug therapy: Secondary | ICD-10-CM

## 2020-01-11 DIAGNOSIS — Z79891 Long term (current) use of opiate analgesic: Secondary | ICD-10-CM

## 2020-01-11 DIAGNOSIS — N182 Chronic kidney disease, stage 2 (mild): Secondary | ICD-10-CM

## 2020-01-11 DIAGNOSIS — I129 Hypertensive chronic kidney disease with stage 1 through stage 4 chronic kidney disease, or unspecified chronic kidney disease: Secondary | ICD-10-CM

## 2020-01-11 DIAGNOSIS — R600 Localized edema: Secondary | ICD-10-CM

## 2020-01-11 DIAGNOSIS — E1122 Type 2 diabetes mellitus with diabetic chronic kidney disease: Secondary | ICD-10-CM | POA: Diagnosis not present

## 2020-01-11 DIAGNOSIS — E1165 Type 2 diabetes mellitus with hyperglycemia: Secondary | ICD-10-CM | POA: Diagnosis not present

## 2020-01-11 DIAGNOSIS — E1151 Type 2 diabetes mellitus with diabetic peripheral angiopathy without gangrene: Secondary | ICD-10-CM | POA: Diagnosis not present

## 2020-01-11 DIAGNOSIS — F5101 Primary insomnia: Secondary | ICD-10-CM

## 2020-01-11 DIAGNOSIS — R1900 Intra-abdominal and pelvic swelling, mass and lump, unspecified site: Secondary | ICD-10-CM

## 2020-01-11 LAB — POCT GLYCOSYLATED HEMOGLOBIN (HGB A1C): Hemoglobin A1C: 7.6 % — AB (ref 4.0–5.6)

## 2020-01-11 LAB — GLUCOSE, CAPILLARY: Glucose-Capillary: 102 mg/dL — ABNORMAL HIGH (ref 70–99)

## 2020-01-11 MED ORDER — TRAMADOL HCL 50 MG PO TABS: 50.0000 mg | ORAL_TABLET | Freq: Two times a day (BID) | ORAL | 0 refills | Status: DC | PRN

## 2020-01-11 NOTE — Assessment & Plan Note (Signed)
This problem is chronic and stable.  We reviewed her creatinine trend and I emphasized that her creatinine is overall stable.  She sees Dr. Myna Hidalgo 2 times a year and is set to go on May 4.  He gets all the blood work at his office and I have scanned in her most recent results.  PLAN : Follow Dr. Birdie Riddle notes

## 2020-01-11 NOTE — Progress Notes (Signed)
   Subjective:    Patient ID: [name] [name], female    DOB: [birth year], 74 y.o.   MRN: [id number]  HPI  [name] [name] is here for DM & HTN F/U. Please see the A&P for the status of the pt's chronic medical problems.  ROS : per ROS section and in problem oriented charting. All other systems are negative.  PMHx, Soc hx, and / or Fam hx : Lives alone. Still having financial issues with bills after SO died.  Review of Systems Positive for neuropathy, intermittent insomnia, musculoskeletal pain, abdominal swelling, lower extremity edema.  Negative for orthopnea, dyspnea, chest pain    Objective:   Physical Exam Constitutional:      Appearance: Normal appearance. She is not ill-appearing, toxic-appearing or diaphoretic.  HENT:     Head: Normocephalic and atraumatic.  Eyes:     General: No scleral icterus.       Right eye: No discharge.        Left eye: No discharge.     Extraocular Movements: Extraocular movements intact.     Conjunctiva/sclera: Conjunctivae normal.  Cardiovascular:     Rate and Rhythm: Normal rate and regular rhythm.     Comments: +1 lower extremity edema, left greater than right, to knee Pulmonary:     Effort: Pulmonary effort is normal.  Abdominal:     General: Bowel sounds are normal. There is no distension.     Palpations: Abdomen is soft.     Comments: Increased abdominal pannus on the right  Musculoskeletal:        General: No swelling, tenderness, deformity or signs of injury.  Skin:    General: Skin is warm and dry.  Neurological:     General: No focal deficit present.     Mental Status: She is alert. Mental status is at baseline.  Psychiatric:        Mood and Affect: Mood normal.        Behavior: Behavior normal.        Thought Content: Thought content normal.        Judgment: Judgment normal.       Assessment & Plan:

## 2020-01-11 NOTE — Assessment & Plan Note (Signed)
This problem is chronic and stable.  Her hemoglobin was 11.7, RDW elevated at 16, and ferritin 82 November 2020.  She is taking B12 orally and her most recent level was 2018.  I see no reason to recheck it as it should remain supratherapeutic on oral B12 supplementation.  PLAN : Follow labs

## 2020-01-11 NOTE — Assessment & Plan Note (Signed)
This problem is chronic and well controlled.  Her A1c trend has been 6.4 - 7.6 - 7.6 today.  She is on Lantus 10 units, metformin 1000 twice daily, Actos 15, and Victoza 1.2 weekly.  She did not have her meter for download today but states that she has no lows, checks it 3 times a day, the lowest she has recorded is 79 and other she felt okay, CT pelvis without and if she was trying to shake so she got something to eat.  Less than 8 would be my A1c goal.  There is some risk of hypoglycemia and I can offer a professional CGM at her next appointment.  PLAN:  Cont current meds Consider CGM

## 2020-01-11 NOTE — Assessment & Plan Note (Signed)
This problem is chronic and stable.  She gets tramadol 30 pills every few months.  Her most recent prescription was January 7 of 2021.  She uses it only intermittently.  I do not have a contract nor do I get UDS is on a regular basis.  I think the benefits of the medication outweigh the risk.  There has been no red flag or orange flag behavior.  She requested a refill and I prescribed it today.  I reviewed the [facility] narcotic database and it is appropriate.  PLAN:  Cont current meds

## 2020-01-11 NOTE — Assessment & Plan Note (Signed)
This problem is stable and uncontrolled.  Lyrica is not providing her relief.  She had not remembered taking gabapentin but I reviewed her medication history and she had taken it.  I had also tried her on venlafaxine.  I discussed changing Lyrica back to gabapentin with or without venlafaxine.  She wants to think about the change and will remain on Lyrica at this time.  PLAN : Follow

## 2020-01-11 NOTE — Assessment & Plan Note (Signed)
This problem is chronic and uncontrolled.  She has very poor sleep 2-3 times a week.  She cannot fall asleep and tosses and turns.  This occurred last night.  She does not nap during the day.  After a night of not sleeping, she is tired but stays awake all day and usually sleeps well at night.  She is on 4 mg of melatonin without any benefit.  She had been on Ambien in the past but has been off of it for years.  We discussed usage of Ambien and she does not want anything that is going to potentially cause her gait abnormalities, or sluggishness the subsequent day.  She is okay leaving things as is.  PLAN : Follow

## 2020-01-11 NOTE — Patient Instructions (Signed)
1. For your neuropathy, think whether you want to change medicines 2. I sent in your tramadol 3. Marcie Bal and [employer] will call you

## 2020-01-13 ENCOUNTER — Other Ambulatory Visit: Payer: Self-pay | Admitting: Internal Medicine

## 2020-01-17 ENCOUNTER — Other Ambulatory Visit: Payer: Self-pay | Admitting: *Deleted

## 2020-01-17 ENCOUNTER — Ambulatory Visit: Payer: Self-pay

## 2020-01-17 NOTE — Chronic Care Management (AMB) (Signed)
  Chronic Care Management   Outreach Note  01/17/2020 Name: [name] [name] MRN: [id number] DOB: December 11, 1945  Referred by: Bartholomew Crews, MD Reason for referral : Care Coordination (Financial Constraints )   An unsuccessful telephone outreach was attempted today. The patient was referred to the case management team for assistance with care management and care coordination.   Follow Up Plan: A HIPPA compliant phone message was left on mobile number providing contact information and requesting a return call. Unable to leave message on home number.  Second outreach scheduled for 01/24/20.   Patient schedule for office visit on 02/22/20.  Will meet with patient then if unable to connect with her by phone.        Ronn Melena, [place] Coordination Social Worker [place] [phone]

## 2020-01-17 NOTE — Patient Outreach (Signed)
[facility]) Care Management  01/17/2020  [name] [name] Sep 25, 1946 [id number]   RN Health Coach Case Closure  Referral Date:05/09/2018 Referral Source:Self referral Reason for Referral:"Physician requested her to contact [facility]" [place] [employer]   Outreach Attempt:  Received notification patient is now to engage with the Embedded Chronic Care Management team.  Plan:  RN Health Coach will close Disease Management Case at this time.  RN Health Coach will send primary care provider Case Closure Letter.  RN Health Coach will send patient Case Closure Letter.  [place] [phone] [email or web address]

## 2020-01-18 ENCOUNTER — Ambulatory Visit: Payer: Self-pay | Admitting: *Deleted

## 2020-01-24 ENCOUNTER — Ambulatory Visit: Payer: Self-pay

## 2020-01-24 NOTE — Chronic Care Management (AMB) (Signed)
  Chronic Care Management   Outreach Note  01/24/2020 Name: [name] [name] MRN: [id number] DOB: [birth year]  Referred by: Bartholomew Crews, MD Reason for referral : Care Coordination (Financial Constraints)   A second unsuccessful telephone outreach was attempted today. The patient was referred to the case management team for assistance with care management and care coordination.   Follow Up Plan: A HIPPA compliant phone message was left for the patient providing contact information and requesting a return call.   Patient scheduled for office visit on 02/22/20.  Will meet with her then to introduce CCM program and complete initial Social Work assessment.      Ronn Melena, [place] Coordination Social Worker [place] [phone]

## 2020-01-30 ENCOUNTER — Other Ambulatory Visit: Payer: Self-pay

## 2020-01-30 ENCOUNTER — Other Ambulatory Visit: Payer: Self-pay | Admitting: Internal Medicine

## 2020-01-30 ENCOUNTER — Ambulatory Visit (INDEPENDENT_AMBULATORY_CARE_PROVIDER_SITE_OTHER): Payer: Medicare Other | Admitting: Sports Medicine

## 2020-01-30 VITALS — BP 141/68 | Wt 216.0 lb

## 2020-01-30 DIAGNOSIS — M47812 Spondylosis without myelopathy or radiculopathy, cervical region: Secondary | ICD-10-CM | POA: Diagnosis not present

## 2020-01-30 DIAGNOSIS — M19019 Primary osteoarthritis, unspecified shoulder: Secondary | ICD-10-CM | POA: Diagnosis not present

## 2020-01-30 DIAGNOSIS — M19012 Primary osteoarthritis, left shoulder: Secondary | ICD-10-CM | POA: Insufficient documentation

## 2020-01-30 MED ORDER — METHYLPREDNISOLONE ACETATE 80 MG/ML IJ SUSP: 80.0000 mg | Freq: Once | INTRAMUSCULAR | Status: DC

## 2020-01-30 MED ORDER — METHYLPREDNISOLONE ACETATE 80 MG/ML IJ SUSP
80.0000 mg | Freq: Once | INTRAMUSCULAR | Status: AC
  Administered 2020-01-30: 80 mg via INTRAMUSCULAR

## 2020-01-30 NOTE — Assessment & Plan Note (Signed)
Patient has known osteoarthritis of the cervical spine, which has been confirmed with imaging.  Spurling's test today as well as chronic pain in the area also support this diagnosis.  We will provide supportive care with the IM Depo-Medrol injection described above.

## 2020-01-30 NOTE — Assessment & Plan Note (Addendum)
Limited flexion, abduction, internal and external rotation of the bilateral shoulders as well as patient's chronic pain and prior imaging are consistent with osteoarthritis of the glenohumeral joints bilaterally.  Although patient responded well to physical therapy last year, she did not think that she has a ride this year, so we will hold off on this prescription currently.  If she does find transportation, we will be happy to prescribe physical therapy.  She also experienced relief from an IM Depo-Medrol injection in 2020, so we will provide that today.

## 2020-01-30 NOTE — Progress Notes (Addendum)
SUBJECTIVE:   CHIEF COMPLAINT / HPI:   Left-sided neck and shoulder pain Ms. Snarr is an established patient who presents today with acute on chronic left-sided neck and shoulder pain.  She thinks that her neck pain is causing her shoulder pain, and her pain will radiate to her left elbow.  She says that her pain worsened about 3 weeks ago.  She cannot identify a trigger for her pain.  Her pain is exacerbated by rotation of the neck and is alleviated by rest.  She has not taken anything for the pain since she is limited due to her chronic kidney disease.  She denies any numbness or tingling of the left upper extremity and denies swelling.  It is difficult to lie on her left side due to her pain.  PERTINENT  PMH / PSH: Chronic pain, type 2 diabetes, CKD 2, hypertension, peripheral vascular disease, diabetic neuropathy  OBJECTIVE:   BP (!) 141/68   Wt 216 lb (98 kg)   BMI 52.08 kg/m   Shoulder, left: No evidence of bony deformity, asymmetry, or muscle atrophy; Mild tenderness over long head of biceps (bicipital groove). TTP at [facility] joint. Limited active and passive range of motion (90 flex Huel Cote /90Abd /60ER /50IR), Thumb to L5 without significant tenderness. Strength 4/5 throughout. No abnormal scapular function observed. Sensation intact. Peripheral pulses intact.  Special Tests:   - Crossarm test: POS   - Empty can: POS   - Hawkins: NEG   - Neer test: POS   - Yergason's: POS   - Speeds test: POS Shoulder, right: No evidence of bony deformity, asymmetry, or muscle atrophy; Limited active and passive range of motion (90 flex Huel Cote /90Abd /90ER /50IR), Thumb to L5 without significant tenderness. Strength 4/5 throughout. No abnormal scapular function observed. Sensation intact. Peripheral pulses intact. Neck: No visual abnormality.  Tenderness to palpation of the paraspinal muscles and trapezius, left greater than right.  Normal ROM on flexion, lateral extension, and rotation.   Limited ROM on extension.  Spurling's test is positive on the left.    ASSESSMENT/PLAN:   Glenohumeral arthritis Limited flexion, abduction, internal and external rotation of the bilateral shoulders as well as patient's chronic pain and prior imaging are consistent with osteoarthritis of the glenohumeral joints bilaterally.  Although patient responded well to physical therapy last year, she did not think that she has a ride this year, so we will hold off on this prescription currently.  If she does find transportation, we will be happy to prescribe physical therapy.  She also experienced relief from an IM Depo-Medrol injection in 2020, so we will provide that today.  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  Spurling's test today as well as chronic pain in the area also support this diagnosis.  We will provide supportive care with the IM Depo-Medrol injection described above.     Kathrene Alu, MD [place]   Patient seen and evaluated with the resident.  I agree with the above plan of care.  Patient has a known history of left shoulder DJD, rotator cuff tendinopathy, and cervical radiculopathy.  Is difficult to tell which 1 of those is bothering her the most.  She has done well in the past with IM injections of Depo-Medrol.  This injection was repeated today with 80 mg of Depo-Medrol.  She is also done well with physical therapy in the past but transportation  is an issue for her.  She will call me if she is able to arrange transportation to physical therapy.  Follow-up as needed.

## 2020-01-31 ENCOUNTER — Encounter: Payer: Self-pay | Admitting: Sports Medicine

## 2020-02-13 ENCOUNTER — Ambulatory Visit: Payer: Self-pay

## 2020-02-13 DIAGNOSIS — D631 Anemia in chronic kidney disease: Secondary | ICD-10-CM | POA: Diagnosis not present

## 2020-02-13 DIAGNOSIS — I129 Hypertensive chronic kidney disease with stage 1 through stage 4 chronic kidney disease, or unspecified chronic kidney disease: Secondary | ICD-10-CM | POA: Diagnosis not present

## 2020-02-13 DIAGNOSIS — E1122 Type 2 diabetes mellitus with diabetic chronic kidney disease: Secondary | ICD-10-CM | POA: Diagnosis not present

## 2020-02-13 DIAGNOSIS — N179 Acute kidney failure, unspecified: Secondary | ICD-10-CM | POA: Diagnosis not present

## 2020-02-13 DIAGNOSIS — N182 Chronic kidney disease, stage 2 (mild): Secondary | ICD-10-CM | POA: Diagnosis not present

## 2020-02-13 NOTE — Chronic Care Management (AMB) (Signed)
  Chronic Care Management   Encounter opened in error

## 2020-02-19 ENCOUNTER — Ambulatory Visit: Payer: Self-pay

## 2020-02-19 ENCOUNTER — Telehealth: Payer: Medicare Other

## 2020-02-19 NOTE — Chronic Care Management (AMB) (Signed)
  Chronic Care Management   Outreach Note  02/19/2020 Name: [name] [name] MRN: [id number] DOB: Nov 27, 1945  Referred by: Bartholomew Crews, MD Reason for referral : Care Coordination [facility] )   Third unsuccessful telephone outreach was attempted today. The patient was referred to the case management team for assistance with care management and care coordination. The patient's primary care provider has been notified of our unsuccessful attempts to make or maintain contact with the patient. The care management team is pleased to engage with this patient at any time in the future should he/she be interested in assistance from the care management team.   Follow Up Plan: Unable to leave message at home number due to "message queue" being full.  A HIPPA compliant phone message was left for the patient on mobile number providing contact information and requesting a return call. Will also mail letter.      Ronn Melena, [place] Coordination Social Worker [place] [phone]

## 2020-02-22 ENCOUNTER — Ambulatory Visit: Payer: Medicare Other

## 2020-02-22 ENCOUNTER — Ambulatory Visit: Payer: Medicare Other | Admitting: Cardiology

## 2020-03-08 ENCOUNTER — Other Ambulatory Visit: Payer: Self-pay | Admitting: Internal Medicine

## 2020-03-08 NOTE — Telephone Encounter (Signed)
July appt new PCP

## 2020-03-29 ENCOUNTER — Other Ambulatory Visit: Payer: Self-pay | Admitting: Internal Medicine

## 2020-04-01 ENCOUNTER — Encounter: Payer: Self-pay | Admitting: *Deleted

## 2020-04-11 ENCOUNTER — Other Ambulatory Visit: Payer: Self-pay | Admitting: Internal Medicine

## 2020-04-12 ENCOUNTER — Ambulatory Visit: Payer: Medicare Other | Admitting: Podiatry

## 2020-04-18 ENCOUNTER — Other Ambulatory Visit: Payer: Self-pay

## 2020-04-18 ENCOUNTER — Encounter: Payer: Self-pay | Admitting: Cardiology

## 2020-04-18 ENCOUNTER — Ambulatory Visit (INDEPENDENT_AMBULATORY_CARE_PROVIDER_SITE_OTHER): Payer: Medicare Other | Admitting: Cardiology

## 2020-04-18 VITALS — BP 124/78 | HR 79 | Ht <= 58 in | Wt 226.8 lb

## 2020-04-18 DIAGNOSIS — G720 Drug-induced myopathy: Secondary | ICD-10-CM

## 2020-04-18 DIAGNOSIS — I5032 Chronic diastolic (congestive) heart failure: Secondary | ICD-10-CM

## 2020-04-18 DIAGNOSIS — I2510 Atherosclerotic heart disease of native coronary artery without angina pectoris: Secondary | ICD-10-CM | POA: Diagnosis not present

## 2020-04-18 DIAGNOSIS — E782 Mixed hyperlipidemia: Secondary | ICD-10-CM | POA: Diagnosis not present

## 2020-04-18 DIAGNOSIS — T466X5A Adverse effect of antihyperlipidemic and antiarteriosclerotic drugs, initial encounter: Secondary | ICD-10-CM

## 2020-04-18 DIAGNOSIS — I10 Essential (primary) hypertension: Secondary | ICD-10-CM | POA: Diagnosis not present

## 2020-04-18 NOTE — Patient Instructions (Signed)
Your physician recommends that you continue on your current medications as directed. Please refer to the Current Medication list given to you today.     Your physician wants you to follow-up in: 6 MONTHS WITH DR NELSON You will receive a reminder letter in the mail two months in advance. If you don't receive a letter, please call our office to schedule the follow-up appointment.  

## 2020-04-18 NOTE — Progress Notes (Signed)
04/19/2020 [name] [name]   18-Jan-1946  [id number]  Primary Physician Jimmye Norman Elaina Pattee, MD Primary Cardiologist: Ena Dawley, MD  Electrophysiologist: None   Reason for Visit/CC: DOE  HPI:   [name] [name] is a 74 y.o. female with h/o non osbstructive CAD, discovered by [facility] in 2005 --> medical management was elected.TTE in 02/2017 showed normal LVEF at 65-70%. Mild DD, mild AI, mild MR, mild TR and mildly elevated pulmonary pressure was noted. Additional PMH includes, HTN, HLD DM and PVD>>2015 LE arterial Duplex - Possible mild inflow disease on the left. 0-49% bilateral SFA disease, without focal stenosis. Three vesel run-off, bilaterally. Medical tx.  She was admitted 02/2017 to ICU for acute metabolic encephalopathy with hypovolemic shock and AKI. She required temporary intubation.  Her ACE inhibitor was discontinued due to acute kidney injury and renal function improved. Amlodipine was added in place of ACE for BP control.   11/22/2019 - the patient is coming after 1 year, she has been doing well, she remains active and has noticed worsening dyspnea on mild-to-moderate exertion.  No chest pain.  She sometimes has exertional dizziness but no falls.  She denies any palpitations or syncope.  She gets occasional lower extremity edema.  She complains of myalgias and polyarthralgias.  04/17/2020 - Lexiscan nuclear stress test showed hyperdynamic LVEF and no ischemia. She states that she is minimally active, walks with a cane and only if she has to. She gets SOB with minimal exertion. No chest pain, no LE edema. No falls.   Current Meds  Medication Sig  . Accu-Chek FastClix Lancets MISC USE THREE TIMES DAILY. 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 ACCU-CHEK GUIDE test strip TEST BLOOD SUGAR 3 TIMES DAILY  . amLODipine (NORVASC) 5 MG tablet TAKE 1 TABLET(5 MG) BY MOUTH DAILY  . aspirin 81 MG tablet Take 1 tablet (81 mg total) by mouth daily.  [name] atenolol (TENORMIN) 100 MG tablet TAKE 1  TABLET(100 MG) BY MOUTH DAILY  . benazepril (LOTENSIN) 40 MG tablet TAKE 1 TABLET(40 MG) BY MOUTH DAILY  . Blood Glucose Monitoring Suppl (ACCU-CHEK GUIDE) w/Device KIT 1 each by Does not apply route 3 (three) times daily.  . furosemide (LASIX) 40 MG tablet TAKE 1 TABLET BY MOUTH EVERY DAY  . Insulin Glargine (LANTUS SOLOSTAR) 100 UNIT/ML Solostar Pen Inject 8 Units into the skin daily with breakfast.  . Insulin Pen Needle (BD PEN NEEDLE NANO U/F) 32G X 4 MM MISC USE TO INJECT VICTOZA ONCE A DAY AND LANTUS ONCE A DAY AS DIRECTED  . isosorbide mononitrate (IMDUR) 30 MG 24 hr tablet TAKE 1 TABLET BY MOUTH ONCE DAILY  . liraglutide (VICTOZA) 18 MG/3ML SOPN ADMINISTER 1.2 MG UNDER THE SKIN DAILY  . loratadine (CLARITIN) 10 MG tablet TAKE 1 TABLET(10 MG) BY MOUTH DAILY  . Melatonin 1 MG TABS Take 1 tablet (1 mg total) by mouth at bedtime.  . metFORMIN (GLUCOPHAGE) 1000 MG tablet TAKE 1 TABLET(1000 MG) BY MOUTH TWICE DAILY WITH A MEAL  . nitroGLYCERIN (NITROSTAT) 0.4 MG SL tablet PLACE 1 TABLET UNDER THE TONGUE EVERY 5 MINUTES AS NEEDED FOR CHEST PAIN  . omeprazole (PRILOSEC) 40 MG capsule Take 1 capsule (40 mg total) by mouth 2 (two) times daily.  [name] PARoxetine (PAXIL) 40 MG tablet TAKE 1 TABLET BY MOUTH EVERY MORNING  . pioglitazone (ACTOS) 15 MG tablet TAKE 1 TABLET(15 MG) BY MOUTH DAILY  . pregabalin (LYRICA) 150 MG capsule TAKE 1 CAPSULE(150 MG) BY MOUTH DAILY  .  rosuvastatin (CRESTOR) 10 MG tablet Take 1 tablet (10 mg total) by mouth daily.  . traMADol (ULTRAM) 50 MG tablet Take 1 tablet (50 mg total) by mouth 2 (two) times daily as needed.  . vitamin B-12 (CYANOCOBALAMIN) 1000 MCG tablet Take 1,000 mcg by mouth daily.   No Known Allergies Past Medical History:  Diagnosis Date  . Anemia   . Arthritis   . Blood transfusion without reported diagnosis   . CAD 08/26/2006   Cath 07/05:multiple areas of nonobstructive disease = medical & RF mgmt, well preserved global systolic function. Dr.  Lia Foyer. Nuclear med stress test 01/2014 : Normal stress nuclear study. LV Ejection Fraction: 76%. LV Wall Motion: NL LV Function; NL Wall Motion.      . Cataract   . Chronic kidney disease   . Chronic pain syndrome 01/03/2013   Multifactorial. Non opioid requiring.   L2-5 fusion, with hardware at L2-3, stable per MRI 2010. Followed by neurosurgery (Dr. 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    [name] DEPRESSION 08/26/2006   On paxil    . DIABETES MELLITUS, TYPE II 08/26/2006   Insulin dependent. Victoza added 03/2014  . DYSLIPIDEMIA 11/01/2006   LDL goal < 100, 70 if can achieve without side effects.     . Gallstones   . GERD 08/26/2006   Heartburn QHS.     . Glaucoma   . HYPERTENSION 08/26/2006   On BB, ACEI, lasix, norvasc, metolazone, and imdur.     . OBESITY 08/26/2006   BMI 39.5. Obesity Class 2.     . PVD (peripheral vascular disease) ([place]) 03/27/2014   2015 LE arterial Duplex - Possible mild inflow disease on the left. 0-49% bilateral SFA disease, without focal stenosis. Three vesel run-off, bilaterally. Medical tx.    Family History  Problem Relation Age of Onset  . Cirrhosis Mother   . Diabetes Father   . Hypertension Father   . Hypertension Sister   . Diabetes Sister   . Hyperlipidemia Sister   . Colon cancer Neg Hx   . Throat cancer Neg Hx   . Esophageal cancer Neg Hx   . Rectal cancer Neg Hx   . Stomach cancer Neg Hx   . Colon polyps Neg Hx    Past Surgical History:  Procedure Laterality Date  . CHOLECYSTECTOMY     pt denies 04/07/16  . COLONOSCOPY    . endometrial biospy    . LEFT HEART CATH    . lumbar fusion surgery  10/08   L3-L5  . POLYPECTOMY    . posterior lumbar interfusion surgery  07/18/07   L2-L3  . rotator cuff surgery Bilateral    Social History   Socioeconomic History  . Marital status: Single    Spouse name: Not on file  . Number of children: 2  . Years of education: Not on  file  . Highest education level: Not on file  Occupational History  . Not on file  Tobacco Use  . Smoking status: Passive Smoke Exposure - Never Smoker  . Smokeless tobacco: Never Used  Vaping Use  . Vaping Use: Never used  Substance and Sexual Activity  . Alcohol use: No    Alcohol/week: 0.0 standard drinks  . Drug use: No  . Sexual activity: Not on file  Other Topics Concern  . 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 Has two  daughters and several grand kids. Severe limitation in ambulation. Occ goes out to church but usually daughter gets groceries.    She is one of 7 kids and the oldest so helped raise the other 6.   Social Determinants of Health   Financial Resource Strain: Low Risk   . Difficulty of Paying Living Expenses: Not hard at all  Food Insecurity: No Food Insecurity  . Worried About Charity fundraiser in the Last Year: Never true  . Ran Out of Food in the Last Year: Never true  Transportation Needs: No Transportation Needs  . Lack of Transportation (Medical): No  . Lack of Transportation (Non-Medical): No  Physical Activity: Insufficiently Active  . Days of Exercise per Week: 3 days  . Minutes of Exercise per Session: 20 min  Stress: Stress Concern Present  . Feeling of Stress : To some extent  Social Connections:   . Frequency of Communication with Friends and Family:   . Frequency of Social Gatherings with Friends and Family:   . Attends Religious Services:   . Active Member of Clubs or Organizations:   . Attends Archivist Meetings:   [name] Marital Status:   Intimate Partner Violence: Not At Risk  . Fear of Current or Ex-Partner: No  . Emotionally Abused: No  . Physically Abused: No  . Sexually Abused: No    Lipid Panel     Component Value Date/Time   CHOL 111 12/06/2018 1020   TRIG 68 12/06/2018 1020   HDL 50 12/06/2018 1020   CHOLHDL 2.2 12/06/2018 1020   CHOLHDL 1.9 12/30/2015 0832   VLDL 12  12/30/2015 0832   LDLCALC 47 12/06/2018 1020   Review of Systems: General: negative for chills, fever, night sweats or weight changes.  Cardiovascular: negative for chest pain, dyspnea on exertion, edema, orthopnea, palpitations, paroxysmal nocturnal dyspnea or shortness of breath Dermatological: negative for rash Respiratory: negative for cough or wheezing Urologic: negative for hematuria Abdominal: negative for nausea, vomiting, diarrhea, bright red blood per rectum, melena, or hematemesis Neurologic: negative for visual changes, syncope, or dizziness All other systems reviewed and are otherwise negative except as noted above.  Physical Exam:  Blood pressure 124/78, pulse 79, height _0  (1.372 m), weight 226 lb 12.8 oz (102.9 kg), SpO2 99 %.  General appearance: alert, cooperative, no distress and morbidly obese Neck: no carotid bruit and no JVD Lungs: clear to auscultation bilaterally Heart: regular rate and rhythm, S1, S2 normal, no murmur, click, rub or gallop Extremities: extremities normal, atraumatic, no cyanosis or edema Pulses: 2+ and symmetric Skin: Skin color, texture, turgor normal. No rashes or lesions Neurologic: Alert and oriented X 3, normal strength and tone. Normal symmetric reflexes. Normal coordination and gait  EKG -shows normal sinus rhythm, 74 BPM, normal EKG, unchanged from prior, this was personally reviewed.     ASSESSMENT AND PLAN:   1. CAD: h/o non obstructive CAD, echo 02/2017 showed normal LVEF at 65-70%.  Lexiscan nuclear stress test in 12/2019 negative for ischemia. The patient is deconditioned, she is educated on importance of daily exercise/walking starting with 10 minutes/day with increments of 5 minutes/day each week until she reaches 30 minutes.   2. HTN:  Controlled on current regimen.  No orthostatic hypotension with 1 fall 7 months ago, will continue same management for now.  3. HLD: intolerant to Lipitor, now myalgias with Crestor, we will  hold for 4 weeks and see if any improvement is seen, if yes, we will  refer to the lipid clinic for PCSK 9 inhibitors.  4. DM: on insulin. Followed by PCP.   5. PVD: 2015 LE arterial Duplex - Possible mild inflow disease on the left. 0-49% bilateral SFA disease, without focal stenosis. Three vesel run-off, bilaterally. Medical tx. Denies claudication. Continue ASA and statin.   6. Chronic Diastolic Dysfunction: most recent 2D echo 02/2017 showed normal LVEF and mild DD. Euvolemic on physical exam.  l.  7. Valvular Disease: mild. Most recent 2D echo 02/2017 showed mild AL, mild MR and mild TR.  She denies dyspnea.  No significant murmurs noted on auscultation today.  Follow-Up Dr. Meda Coffee in 6 months.  Ena Dawley, MD [facility] 04/19/2020 7:34 AM

## 2020-04-19 ENCOUNTER — Encounter: Payer: Self-pay | Admitting: Podiatry

## 2020-04-19 ENCOUNTER — Ambulatory Visit (INDEPENDENT_AMBULATORY_CARE_PROVIDER_SITE_OTHER): Payer: Medicare Other | Admitting: Podiatry

## 2020-04-19 VITALS — Temp 97.0°F

## 2020-04-19 DIAGNOSIS — M79674 Pain in right toe(s): Secondary | ICD-10-CM | POA: Diagnosis not present

## 2020-04-19 DIAGNOSIS — L84 Corns and callosities: Secondary | ICD-10-CM

## 2020-04-19 DIAGNOSIS — M79675 Pain in left toe(s): Secondary | ICD-10-CM | POA: Diagnosis not present

## 2020-04-19 DIAGNOSIS — E1142 Type 2 diabetes mellitus with diabetic polyneuropathy: Secondary | ICD-10-CM

## 2020-04-19 DIAGNOSIS — Q72899 Other reduction defects of unspecified lower limb: Secondary | ICD-10-CM

## 2020-04-19 DIAGNOSIS — B351 Tinea unguium: Secondary | ICD-10-CM | POA: Diagnosis not present

## 2020-04-24 NOTE — Progress Notes (Signed)
Subjective:  Patient ID: [name] [name], female    DOB: [birth year],  MRN: [id number]  74 y.o. female presents with at risk foot care with history of diabetic neuropathy and painful callus(es) both feet and painful thick toenails that are difficult to trim. Pain interferes with ambulation. Aggravating factors include wearing enclosed shoe gear. Pain is relieved with periodic professional debridement..    Review of Systems: Negative except as noted in the HPI.  Past Medical History:  Diagnosis Date  . Anemia   . Arthritis   . Blood transfusion without reported diagnosis   . CAD 08/26/2006   Cath 07/05:multiple areas of nonobstructive disease = medical & RF mgmt, well preserved global systolic function. Dr. Lia Foyer. Nuclear med stress test 01/2014 : Normal stress nuclear study. LV Ejection Fraction: 76%. LV Wall Motion: NL LV Function; NL Wall Motion.      . Cataract   . Chronic kidney disease   . Chronic pain syndrome 01/03/2013   Multifactorial. Non opioid requiring.   L2-5 fusion, with hardware at L2-3, stable per MRI 2010. Followed by neurosurgery (Dr. 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    [name] DEPRESSION 08/26/2006   On paxil    . DIABETES MELLITUS, TYPE II 08/26/2006   Insulin dependent. Victoza added 03/2014  . DYSLIPIDEMIA 11/01/2006   LDL goal < 100, 70 if can achieve without side effects.     . Gallstones   . GERD 08/26/2006   Heartburn QHS.     . Glaucoma   . HYPERTENSION 08/26/2006   On BB, ACEI, lasix, norvasc, metolazone, and imdur.     . OBESITY 08/26/2006   BMI 39.5. Obesity Class 2.     . PVD (peripheral vascular disease) ([place]) 03/27/2014   2015 LE arterial Duplex - Possible mild inflow disease on the left. 0-49% bilateral SFA disease, without focal stenosis. Three vesel run-off, bilaterally. Medical tx.    Past Surgical History:  Procedure Laterality Date  . CHOLECYSTECTOMY     pt denies 04/07/16  .  COLONOSCOPY    . endometrial biospy    . LEFT HEART CATH    . lumbar fusion surgery  10/08   L3-L5  . POLYPECTOMY    . posterior lumbar interfusion surgery  07/18/07   L2-L3  . rotator cuff surgery Bilateral    Patient Active Problem List   Diagnosis Date Noted  . Glenohumeral arthritis 01/30/2020  . Arthritis of neck 01/30/2020  . Ankle swelling, left 11/03/2018  . Esophageal dysphagia 03/03/2018  . Gait instability 03/03/2018  . Aortic atherosclerosis ([place]) 06/17/2017  . 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 Right carotid bruit 04/08/2017  . CKD stage 2 due to type 2 diabetes mellitus ([place]) 12/11/2016  . Diabetic neuropathy ([place]) 12/10/2016  . Small intestinal bacterial overgrowth 12/24/2015  . Goals of care, counseling/discussion 01/18/2015  . ASCUS with positive high risk HPV 08/03/2014  . PVD (peripheral vascular disease) ([place]) 03/27/2014  . Diastolic CHF, chronic ([place]) 12/21/2013  . Healthcare maintenance 04/25/2013  . Chronic pain syndrome 01/03/2013  . Insomnia 12/22/2011  . Vitamin B12 deficiency 12/27/2009  . Vitamin D deficiency 12/27/2009  . Anemia 12/27/2009  . Hypomagnesemia 12/29/2008  . Osteopenia 08/01/2008  . Hyperlipidemia 11/01/2006  . GLAUCOMA NOS 11/01/2006  . DM (diabetes mellitus), type 2, uncontrolled, periph vascular complic ([place]) 69/[id number]/8938  . Obesity 08/26/2006  . Depression 08/26/2006  . HTN (hypertension) 08/26/2006  . Coronary atherosclerosis 08/26/2006  .  GERD 08/26/2006    Current Outpatient Medications:  .  Accu-Chek FastClix Lancets MISC, USE THREE TIMES DAILY. 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  ACCU-CHEK GUIDE test strip, TEST BLOOD SUGAR 3 TIMES DAILY, Disp: 100 strip, Rfl: 5 .  amLODipine (NORVASC) 5 MG tablet, TAKE 1 TABLET(5 MG) BY MOUTH DAILY, Disp: 90 tablet, Rfl: 3 .  aspirin 81 MG tablet, Take 1 tablet (81 mg total) by mouth daily., Disp: 90 tablet, Rfl: 3 .  atenolol (TENORMIN) 100 MG  tablet, TAKE 1 TABLET(100 MG) BY MOUTH DAILY, Disp: 90 tablet, Rfl: 3 .  benazepril (LOTENSIN) 40 MG tablet, TAKE 1 TABLET(40 MG) BY MOUTH DAILY, Disp: 90 tablet, Rfl: 3 .  Blood Glucose Monitoring Suppl (ACCU-CHEK GUIDE) w/Device KIT, 1 each by Does not apply route 3 (three) times daily., Disp: 1 kit, Rfl: 0 .  furosemide (LASIX) 40 MG tablet, TAKE 1 TABLET BY MOUTH EVERY DAY, Disp: 90 tablet, Rfl: 3 .  Insulin Glargine (LANTUS SOLOSTAR) 100 UNIT/ML Solostar Pen, Inject 8 Units into the skin daily with breakfast., Disp: 15 mL, Rfl: 3 .  Insulin Pen Needle (BD PEN NEEDLE NANO U/F) 32G X 4 MM MISC, USE TO INJECT VICTOZA ONCE A DAY AND LANTUS ONCE A DAY AS DIRECTED, Disp: 100 each, Rfl: 5 .  isosorbide mononitrate (IMDUR) 30 MG 24 hr tablet, TAKE 1 TABLET BY MOUTH ONCE DAILY, Disp: 90 tablet, Rfl: 1 .  liraglutide (VICTOZA) 18 MG/3ML SOPN, ADMINISTER 1.2 MG UNDER THE SKIN DAILY, Disp: 18 mL, Rfl: 5 .  loratadine (CLARITIN) 10 MG tablet, TAKE 1 TABLET(10 MG) BY MOUTH DAILY, Disp: 90 tablet, Rfl: 3 .  Melatonin 1 MG TABS, Take 1 tablet (1 mg total) by mouth at bedtime., Disp: 90 tablet, Rfl: 3 .  metFORMIN (GLUCOPHAGE) 1000 MG tablet, TAKE 1 TABLET(1000 MG) BY MOUTH TWICE DAILY WITH A MEAL, Disp: 180 tablet, Rfl: 3 .  nitroGLYCERIN (NITROSTAT) 0.4 MG SL tablet, PLACE 1 TABLET UNDER THE TONGUE EVERY 5 MINUTES AS NEEDED FOR CHEST PAIN, Disp: 25 tablet, Rfl: 0 .  omeprazole (PRILOSEC) 40 MG capsule, Take 1 capsule (40 mg total) by mouth 2 (two) times daily., Disp: 180 capsule, Rfl: 3 .  PARoxetine (PAXIL) 40 MG tablet, TAKE 1 TABLET BY MOUTH EVERY MORNING, Disp: 90 tablet, Rfl: 3 .  pioglitazone (ACTOS) 15 MG tablet, TAKE 1 TABLET(15 MG) BY MOUTH DAILY, Disp: 90 tablet, Rfl: 3 .  pregabalin (LYRICA) 150 MG capsule, TAKE 1 CAPSULE(150 MG) BY MOUTH DAILY, Disp: 90 capsule, Rfl: 3 .  rosuvastatin (CRESTOR) 10 MG tablet, Take 1 tablet (10 mg total) by mouth daily., Disp: 90 tablet, Rfl: 1 .  traMADol (ULTRAM)  50 MG tablet, Take 1 tablet (50 mg total) by mouth 2 (two) times daily as needed., Disp: 60 tablet, Rfl: 0 .  vitamin B-12 (CYANOCOBALAMIN) 1000 MCG tablet, Take 1,000 mcg by mouth daily., Disp: , Rfl:  No Known Allergies Social History   Tobacco Use  Smoking Status Passive Smoke Exposure - Never Smoker  Smokeless Tobacco Never Used    Objective:   Constitutional Pt is a pleasant 74 y.o. African American female obese in NAD.[name] AAO x 3.   Vascular Neurovascular status unchanged b/l lower extremities. Capillary refill time to digits immediate b/l. Palpable DP pulse(s) b/l lower extremities Nonpalpable PT pulse(s) b/l lower extremities. Pedal hair present. Lower extremity skin temperature gradient within normal limits. No pain with calf compression b/l. No edema noted b/l lower  extremities. No ischemia or gangrene noted b/l lower extremities. No cyanosis or clubbing noted.  Neurologic Normal speech. Oriented to person, place, and time. Protective sensation diminished with 10g monofilament b/l. Proprioception intact bilaterally.  Dermatologic Pedal skin with normal turgor, texture and tone bilaterally. No open wounds bilaterally. No interdigital macerations bilaterally. Toenails 1-5 b/l elongated, discolored, dystrophic, thickened, crumbly with subungual debris and tenderness to dorsal palpation. Hyperkeratotic lesion(s) submet head 1 left foot, submet head 1 right foot, submet head 5 left foot and submet head 5 right foot.  No erythema, no edema, no drainage, no flocculence.  Orthopedic: Normal muscle strength 5/5 to all lower extremity muscle groups bilaterally. No pain crepitus or joint limitation noted with ROM b/l. 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   Radiographs: None Assessment:   1. Pain due to onychomycosis of toenails of both feet   2. Callus   3. Brachymetatarsia   4. Diabetic peripheral neuropathy associated with type 2 diabetes mellitus ([place])    Plan:  Patient was evaluated and  treated and all questions answered.  Onychomycosis with pain -Nails palliatively debridement as below. -Educated on self-care  Procedure: Nail Debridement Rationale: Pain Type of Debridement: manual, sharp debridement. Instrumentation: Nail nipper, rotary burr. Number of Nails: 10  -Toenails 1-5 b/l were debrided in length and girth with sterile nail nippers and dremel without iatrogenic bleeding.  -Callus(es) submet head 1 left foot, submet head 1 right foot, submet head 5 left foot and submet head 5 right foot pared utilizing sterile scalpel blade without complication or incident. Total number debrided =4. -Patient to report any pedal injuries to medical professional immediately. -Patient to continue soft, supportive shoe gear daily. -Patient/POA to call should there be question/concern in the interim.  Return in about 3 months (around 07/20/2020).  Marzetta Board, DPM

## 2020-04-30 ENCOUNTER — Other Ambulatory Visit: Payer: Self-pay | Admitting: *Deleted

## 2020-04-30 DIAGNOSIS — IMO0002 Reserved for concepts with insufficient information to code with codable children: Secondary | ICD-10-CM

## 2020-04-30 DIAGNOSIS — E1165 Type 2 diabetes mellitus with hyperglycemia: Secondary | ICD-10-CM

## 2020-04-30 DIAGNOSIS — E1151 Type 2 diabetes mellitus with diabetic peripheral angiopathy without gangrene: Secondary | ICD-10-CM

## 2020-04-30 MED ORDER — ACCU-CHEK GUIDE VI STRP: ORAL_STRIP | 5 refills | Status: DC

## 2020-05-01 ENCOUNTER — Encounter: Payer: Self-pay | Admitting: Internal Medicine

## 2020-05-01 DIAGNOSIS — D638 Anemia in other chronic diseases classified elsewhere: Secondary | ICD-10-CM | POA: Insufficient documentation

## 2020-05-01 DIAGNOSIS — K222 Esophageal obstruction: Secondary | ICD-10-CM | POA: Insufficient documentation

## 2020-05-01 DIAGNOSIS — I110 Hypertensive heart disease with heart failure: Secondary | ICD-10-CM

## 2020-05-01 DIAGNOSIS — E669 Obesity, unspecified: Secondary | ICD-10-CM | POA: Insufficient documentation

## 2020-05-01 DIAGNOSIS — E66812 Obesity, class 2: Secondary | ICD-10-CM | POA: Insufficient documentation

## 2020-05-01 DIAGNOSIS — H40003 Preglaucoma, unspecified, bilateral: Secondary | ICD-10-CM | POA: Insufficient documentation

## 2020-05-01 HISTORY — DX: Hypertensive heart disease with heart failure: I11.0

## 2020-05-01 NOTE — Progress Notes (Signed)
Established Patient Office Visit  Subjective:  Patient ID: [name] [name], female    DOB: 01-21-1946  Age: 74 y.o. MRN: [id number]  CC: Back pain  HPI [name] [name] presents for follow-up of chronic problems including back pain and type 2 diabetes mellitus complicated by peripheral neuropathy.  Today is our first visit with me as her new PCP, which is quite an adjustment as she had been with Dr. Lynnae January for many years.  When asked what her main priorities were for her health, she was quick to state that she wanted to lose weight but was concerned that she cannot exercise due to mobility-limiting chronic back pain.  She recently had a follow-up visit with cardiologist Dr. Meda Coffee on 04/18/2020, and with Dr. Adah Perl with podiatry on 04/19/2020.  Functionally, she ambulates very slowly and only short distances, limited by back pain.  She is able to dress, manage hygiene and toileting, and eat independently.  Her daughter provides transportation to appointments, shops for her, and manages her prescriptions (although she takes her medications independently; " I have a system" - though she doesn't use a medication organizer).  Health maintenance: Immunizations-PCV 13 01/2015, PPSV 23 01/2010, Td 08/2010, Zostavax 01/2013, Covid vaccine Jan, Feb 2021  Annual mammography, recent 08/2019, normal Vision exam 09/2020 [facility] 2020 (due 2023, polyps)  Past Medical History:  Diagnosis Date  . Anemia   . Arthritis   . Blood transfusion without reported diagnosis   . CAD 08/26/2006   Cath 07/05:multiple areas of nonobstructive disease = medical & RF mgmt, well preserved global systolic function. Dr. Lia Foyer. Nuclear med stress test 01/2014 : Normal stress nuclear study. LV Ejection Fraction: 76%. LV Wall Motion: NL LV Function; NL Wall Motion.      . Cataract   . Chronic kidney disease   . Chronic pain syndrome 01/03/2013   Multifactorial. Non opioid requiring.   L2-5 fusion, with hardware  at L2-3, stable per MRI 2010. Followed by neurosurgery (Dr. 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    [name] DEPRESSION 08/26/2006   On paxil    . DIABETES MELLITUS, TYPE II 08/26/2006   Insulin requiring. Victoza added 03/2014  . DYSLIPIDEMIA 11/01/2006   LDL goal < 100, 70 if can achieve without side effects.     . Gallstones   . GERD 08/26/2006   Heartburn QHS.     . Glaucoma   . HYPERTENSION 08/26/2006   On BB, ACEI, lasix, norvasc, metolazone, and imdur.     . OBESITY 08/26/2006   BMI 39.5. Obesity Class 2.     . PVD (peripheral vascular disease) ([place]) 03/27/2014   2015 LE arterial Duplex - Possible mild inflow disease on the left. 0-49% bilateral SFA disease, without focal stenosis. Three vesel run-off, bilaterally. Medical tx.     Past Surgical History:  Procedure Laterality Date  . CHOLECYSTECTOMY     pt denies 04/07/16  . COLONOSCOPY    . endometrial biospy    . LEFT HEART CATH    . lumbar fusion surgery  10/08   L3-L5  . POLYPECTOMY    . posterior lumbar interfusion surgery  07/18/07   L2-L3  . rotator cuff surgery Bilateral    Family history reviewed, no changes.  Social History   Socioeconomic History  . Marital status: Single  . Number of children: 2  . Highest education level: 6th grade  Occupational History  . Not on file  Tobacco Use  . Smoking status: Passive Smoke Exposure - Never Smoker  . Smokeless tobacco: Never Used  Vaping Use  . Vaping Use: Never used  Substance and Sexual Activity  . Alcohol use: No  Social History Narrative   Has two daughters and several grand kids. Daughter provides transportation, shops, cleans, and cooks. Never learned how to drive. Severe limitation in ambulation. Occ goes out to church.    She is one of 7 kids and the oldest so helped raise the other 6.    Outpatient Medications Prior to Visit  Medication Sig Dispense Refill  . Accu-Chek FastClix Lancets  MISC USE THREE TIMES DAILY. 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 amLODipine (NORVASC) 5 MG tablet TAKE 1 TABLET(5 MG) BY MOUTH DAILY 90 tablet 3  . aspirin 81 MG tablet Take 1 tablet (81 mg total) by mouth daily. 90 tablet 3  . atenolol (TENORMIN) 100 MG tablet TAKE 1 TABLET(100 MG) BY MOUTH DAILY 90 tablet 3  . benazepril (LOTENSIN) 40 MG tablet TAKE 1 TABLET(40 MG) BY MOUTH DAILY 90 tablet 3  . Blood Glucose Monitoring Suppl (ACCU-CHEK GUIDE) w/Device KIT 1 each by Does not apply route 3 (three) times daily. 1 kit 0  . furosemide (LASIX) 40 MG tablet TAKE 1 TABLET BY MOUTH EVERY DAY 90 tablet 3  . glucose blood (ACCU-CHEK GUIDE) test strip Use to test blood glucose 3 times daily. Dx E11.65 100 strip 5  . Insulin Glargine (LANTUS SOLOSTAR) 100 UNIT/ML Solostar Pen Inject 8 Units into the skin daily with breakfast. 15 mL 3  . Insulin Pen Needle (BD PEN NEEDLE NANO U/F) 32G X 4 MM MISC USE TO INJECT VICTOZA ONCE A DAY AND LANTUS ONCE A DAY AS DIRECTED 100 each 5  . isosorbide mononitrate (IMDUR) 30 MG 24 hr tablet TAKE 1 TABLET BY MOUTH ONCE DAILY 90 tablet 1  . liraglutide (VICTOZA) 18 MG/3ML SOPN ADMINISTER 1.2 MG UNDER THE SKIN DAILY 18 mL 5  . loratadine (CLARITIN) 10 MG tablet TAKE 1 TABLET(10 MG) BY MOUTH DAILY 90 tablet 3  . Melatonin 1 MG TABS Take 1 tablet (1 mg total) by mouth at bedtime. 90 tablet 3  . metFORMIN (GLUCOPHAGE) 1000 MG tablet TAKE 1 TABLET(1000 MG) BY MOUTH TWICE DAILY WITH A MEAL 180 tablet 3  . nitroGLYCERIN (NITROSTAT) 0.4 MG SL tablet PLACE 1 TABLET UNDER THE TONGUE EVERY 5 MINUTES AS NEEDED FOR CHEST PAIN 25 tablet 0  . omeprazole (PRILOSEC) 40 MG capsule Take 1 capsule (40 mg total) by mouth 2 (two) times daily. 180 capsule 3  . PARoxetine (PAXIL) 40 MG tablet TAKE 1 TABLET BY MOUTH EVERY MORNING 90 tablet 3  . pioglitazone (ACTOS) 15 MG tablet TAKE 1 TABLET(15 MG) BY MOUTH DAILY 90 tablet 3  . pregabalin (LYRICA) 150 MG capsule TAKE 1 CAPSULE(150 MG) BY  MOUTH DAILY 90 capsule 3  . rosuvastatin (CRESTOR) 10 MG tablet Take 1 tablet (10 mg total) by mouth daily. 90 tablet 1  . traMADol (ULTRAM) 50 MG tablet Take 1 tablet (50 mg total) by mouth 2 (two) times daily as needed. 60 tablet 0  . vitamin B-12 (CYANOCOBALAMIN) 1000 MCG tablet Take 1,000 mcg by mouth daily.     No facility-administered medications prior to visit.    No Known Allergies  ROS Review of Systems  Respiratory: Positive for chest tightness and shortness of breath.   Cardiovascular: Positive for chest pain. Negative for palpitations and leg swelling.  Musculoskeletal: Positive for back pain and gait problem.   Chest tightness and shortness of breath is exertional, relieved by rest.  This occurs a few times a month, third frequency of which has been stable.  On occasion she will remember to take a nitroglycerin which also seems to relieve her pain.  Specifically her gait problem is weakness and pain in the legs requiring her to stand for only short periods of time before she has to sit to rest.  Her back pain is chronic and unremitting.  Objective:      There were no vitals taken for this visit. Wt Readings from Last 3 Encounters:  04/18/20 226 lb 12.8 oz (102.9 kg)  01/30/20 216 lb (98 kg)  01/11/20 224 lb 1.6 oz (101.7 kg)  (inconsistencies noted in April 2021)  Habitus is very obese.  She arrives in a transport chair.  Her demeanor is pleasant and affect is euthymic.  She answers questions appropriately.  She has no respiratory difficulty today.  Although lung sounds are distant due to habitus, no adventitial sounds are heard.  Heart regular rate and rhythm with no audible murmur.  Lower extremities without edema.  Her feet are  and warm, nail beds are dusky.  Dorsalis pedis pulses are 1+ bilaterally.  She has some toe deformities including overriding fourth toes on each foot as well as bunions.  There are no calluses or foot wounds.  Her nails were recently trimmed and  did not appear to have onychomycosis. Back notable for former surgical scars. Gait was not observed today.   Health Maintenance Due  Topic Date Due  . COVID-19 Vaccine (1) Completed Jan, Feb 2021  . LIPID PANEL  12/07/2019  . HEMOGLOBIN A1C  04/11/2020    Assessment & Plan:   Ms. Hueber is experiencing negative quality of life due to impaired physical function related to obesity and chronic pain.  While she desires to lose weight, she is limited in her ability to exercise.  She would like assistance with weight loss.  See problem based discussions for the remainder of her problems.  Follow-up: 1 mo for continued conversations about function and chronic symptom management.    Angelica Pou, MD

## 2020-05-02 ENCOUNTER — Ambulatory Visit: Payer: Medicare Other | Admitting: Internal Medicine

## 2020-05-02 ENCOUNTER — Encounter: Payer: Self-pay | Admitting: Internal Medicine

## 2020-05-02 VITALS — BP 121/56 | HR 73 | Temp 98.4°F | Wt 226.1 lb

## 2020-05-02 DIAGNOSIS — Z8639 Personal history of other endocrine, nutritional and metabolic disease: Secondary | ICD-10-CM

## 2020-05-02 DIAGNOSIS — M8580 Other specified disorders of bone density and structure, unspecified site: Secondary | ICD-10-CM

## 2020-05-02 DIAGNOSIS — E11319 Type 2 diabetes mellitus with unspecified diabetic retinopathy without macular edema: Secondary | ICD-10-CM

## 2020-05-02 DIAGNOSIS — D638 Anemia in other chronic diseases classified elsewhere: Secondary | ICD-10-CM

## 2020-05-02 DIAGNOSIS — G8929 Other chronic pain: Secondary | ICD-10-CM

## 2020-05-02 DIAGNOSIS — I110 Hypertensive heart disease with heart failure: Secondary | ICD-10-CM

## 2020-05-02 DIAGNOSIS — Z86010 Personal history of colonic polyps: Secondary | ICD-10-CM

## 2020-05-02 DIAGNOSIS — E1140 Type 2 diabetes mellitus with diabetic neuropathy, unspecified: Secondary | ICD-10-CM

## 2020-05-02 DIAGNOSIS — K222 Esophageal obstruction: Secondary | ICD-10-CM

## 2020-05-02 DIAGNOSIS — G4700 Insomnia, unspecified: Secondary | ICD-10-CM

## 2020-05-02 DIAGNOSIS — R1319 Other dysphagia: Secondary | ICD-10-CM

## 2020-05-02 DIAGNOSIS — M542 Cervicalgia: Secondary | ICD-10-CM

## 2020-05-02 DIAGNOSIS — I5032 Chronic diastolic (congestive) heart failure: Secondary | ICD-10-CM

## 2020-05-02 DIAGNOSIS — K219 Gastro-esophageal reflux disease without esophagitis: Secondary | ICD-10-CM

## 2020-05-02 DIAGNOSIS — R2681 Unsteadiness on feet: Secondary | ICD-10-CM

## 2020-05-02 DIAGNOSIS — E1142 Type 2 diabetes mellitus with diabetic polyneuropathy: Secondary | ICD-10-CM

## 2020-05-02 DIAGNOSIS — H40003 Preglaucoma, unspecified, bilateral: Secondary | ICD-10-CM

## 2020-05-02 DIAGNOSIS — N182 Chronic kidney disease, stage 2 (mild): Secondary | ICD-10-CM

## 2020-05-02 DIAGNOSIS — E1159 Type 2 diabetes mellitus with other circulatory complications: Secondary | ICD-10-CM

## 2020-05-02 DIAGNOSIS — R1310 Dysphagia, unspecified: Secondary | ICD-10-CM

## 2020-05-02 DIAGNOSIS — I10 Essential (primary) hypertension: Secondary | ICD-10-CM

## 2020-05-02 DIAGNOSIS — E113219 Type 2 diabetes mellitus with mild nonproliferative diabetic retinopathy with macular edema, unspecified eye: Secondary | ICD-10-CM

## 2020-05-02 DIAGNOSIS — E1122 Type 2 diabetes mellitus with diabetic chronic kidney disease: Secondary | ICD-10-CM

## 2020-05-02 DIAGNOSIS — I2510 Atherosclerotic heart disease of native coronary artery without angina pectoris: Secondary | ICD-10-CM

## 2020-05-02 DIAGNOSIS — Z6841 Body Mass Index (BMI) 40.0 and over, adult: Secondary | ICD-10-CM

## 2020-05-02 DIAGNOSIS — E118 Type 2 diabetes mellitus with unspecified complications: Secondary | ICD-10-CM

## 2020-05-02 LAB — POCT GLYCOSYLATED HEMOGLOBIN (HGB A1C): Hemoglobin A1C: 7.2 % — AB (ref 4.0–5.6)

## 2020-05-02 LAB — GLUCOSE, CAPILLARY: Glucose-Capillary: 112 mg/dL — ABNORMAL HIGH (ref 70–99)

## 2020-05-03 LAB — BMP8+ANION GAP
Anion Gap: 14 mmol/L (ref 10.0–18.0)
BUN/Creatinine Ratio: 29 — ABNORMAL HIGH (ref 12–28)
BUN: 24 mg/dL (ref 8–27)
CO2: 24 mmol/L (ref 20–29)
Calcium: 9.1 mg/dL (ref 8.7–10.3)
Chloride: 103 mmol/L (ref 96–106)
Creatinine, Ser: 0.83 mg/dL (ref 0.57–1.00)
GFR calc Af Amer: 80 mL/min/{1.73_m2} (ref >59)
GFR calc non Af Amer: 69 mL/min/{1.73_m2} (ref >59)
Glucose: 121 mg/dL — ABNORMAL HIGH (ref 65–99)
Potassium: 4.7 mmol/L (ref 3.5–5.2)
Sodium: 141 mmol/L (ref 134–144)

## 2020-05-03 LAB — TSH: TSH: 3.53 u[IU]/mL (ref 0.450–4.500)

## 2020-05-03 LAB — VITAMIN D 25 HYDROXY (VIT D DEFICIENCY, FRACTURES): Vit D, 25-Hydroxy: 12.5 ng/mL — ABNORMAL LOW (ref 30.0–100.0)

## 2020-05-03 LAB — VITAMIN B12: Vitamin B-12: 1062 pg/mL (ref 232–1245)

## 2020-05-03 MED ORDER — OMEPRAZOLE 40 MG PO CPDR: 40.0000 mg | DELAYED_RELEASE_CAPSULE | Freq: Every day | ORAL | 3 refills | Status: DC

## 2020-05-03 MED ORDER — PREGABALIN 150 MG PO CAPS: ORAL_CAPSULE | ORAL | 3 refills | Status: DC

## 2020-05-03 MED ORDER — TRAMADOL HCL 50 MG PO TABS: 50.0000 mg | ORAL_TABLET | Freq: Two times a day (BID) | ORAL | 0 refills | Status: DC | PRN

## 2020-05-03 MED ORDER — VICTOZA 18 MG/3ML ~~LOC~~ SOPN: PEN_INJECTOR | SUBCUTANEOUS | 5 refills | Status: DC

## 2020-05-03 NOTE — Assessment & Plan Note (Addendum)
Follow-up BMP today.  On ACE inhibitor. Evidently she follows with [facility] but at this early stage, I'm happy as her PCP to follow her renal fxn.

## 2020-05-03 NOTE — Assessment & Plan Note (Deleted)
High-dose PPI has continued for a very long time despite minimal symptoms.  Will suggest gradual transition to H2 be to be used as needed as long as symptoms are managed.  For step will be to discontinue the evening dose of PPI.

## 2020-05-03 NOTE — Assessment & Plan Note (Signed)
Stable, compensated.  BMP today to monitor renal function and electrolytes while on loop diuretic.

## 2020-05-03 NOTE — Assessment & Plan Note (Signed)
Chronic pain is a limiting factor in an exercise weight loss intervention.  I will explore whether she has had dietary and weight loss education. Increasing Victoza may help with a limited weight loss.

## 2020-05-03 NOTE — Assessment & Plan Note (Signed)
Burning pain in the feet and legs continues.  She requests a refill of pregabalin/Lyrica.  We will discuss this in more detail at next visit.  Given neuropathy and diabetes, will order diabetic shoes.

## 2020-05-03 NOTE — Assessment & Plan Note (Signed)
Today she is reporting no sleep difficulties.  Monitor.

## 2020-05-03 NOTE — Assessment & Plan Note (Signed)
Pain radiates down back of legs suggestive of radicular pain.  Refill tramadol and pregabalin. Tylenol relieves her pain to some degree (she can tell when she forgets to take it) but because she is taking it bid, midday is more painful.  Instructed to increase APAP to ES 2 po tid. SHe has had spinal injections in the past; ineffective.

## 2020-05-03 NOTE — Assessment & Plan Note (Signed)
Well-controlled at 121/56 HR 73.  No change in management. Currently on amlodipine 5 mg, atenolol 100 mg, benazepril 40 mg, furosemide 40 mg, and isosorbide mononitrate 30 mg daily.

## 2020-05-03 NOTE — Assessment & Plan Note (Signed)
A1c has historically been controlled less than 8.  Given age and comorbidities, simplification of regimen is desirable.  She is on minimal Lantus.  Will discontinue this, and increased dose of Victoza.  Potential weight loss would be of benefit.  Goal A1c 7-8

## 2020-05-03 NOTE — Assessment & Plan Note (Signed)
Exertional chest tightness and dyspnea occurs a few times a month, relieved by sitting down, and by nitroglycerin sublingual when she remembers to take it.  She does not feel that these episodes have increased in frequency or intensity.Continue medical management and antiplatelet therapy.  I am happy to monitor her symptoms and management during the stable period.

## 2020-05-03 NOTE — Assessment & Plan Note (Signed)
Ongoing high-dose PPI is a risk factor.  Vitamin D will be checked.  Evening dose of PPI will be discontinued and symptoms monitored.

## 2020-05-03 NOTE — Assessment & Plan Note (Signed)
High-dose PPI has continued for a very long time despite minimal symptoms.  Goal will be to gradually transition to H2B prn.  First step will be elimination of the evening dose of PPI.  She is agreeable.

## 2020-05-03 NOTE — Assessment & Plan Note (Signed)
Follow-up with Dr. Katy Fitch December 2021 for annual evaluation.

## 2020-05-03 NOTE — Assessment & Plan Note (Signed)
Gait instability is primarily due to chronic back pain and obesity causing disruption of her biomechanics.  She is at high risk for falls.

## 2020-05-07 ENCOUNTER — Telehealth: Payer: Self-pay | Admitting: Cardiology

## 2020-05-07 NOTE — Telephone Encounter (Signed)
Spoke with the pt and informed her that she should contact her PCP Dr. Jimmye Norman, who follows and manages her Pigolitazone, for her diabetes. Advised the pt to reach out to Dr. Jimmye Norman office today, and inform her that this medication is causing her issues, so that she could possibly advise on prescribing her a more tolerable, alternate regimen. Pt verbalized understanding and agrees with this plan.  Pt states she was unsure which Provider prescribed and followed this medicine.  She states she will call their office now. Pt appreciative for all the assistance provided.

## 2020-05-07 NOTE — Telephone Encounter (Signed)
Pt c/o medication issue:  1. Name of Medication: pioglitazone 15mg   2. How are you currently taking this medication (dosage and times per day)?   3. Are you having a reaction (difficulty breathing--STAT)?   4. What is your medication issue? She states is making her body achy. She wants to be taken off of it.

## 2020-05-09 ENCOUNTER — Other Ambulatory Visit: Payer: Self-pay | Admitting: *Deleted

## 2020-05-10 MED ORDER — PAROXETINE HCL 40 MG PO TABS: 40.0000 mg | ORAL_TABLET | Freq: Every morning | ORAL | 0 refills | Status: DC

## 2020-05-17 ENCOUNTER — Other Ambulatory Visit: Payer: Self-pay | Admitting: Cardiology

## 2020-06-06 ENCOUNTER — Ambulatory Visit (INDEPENDENT_AMBULATORY_CARE_PROVIDER_SITE_OTHER): Payer: Medicare Other | Admitting: Internal Medicine

## 2020-06-06 ENCOUNTER — Other Ambulatory Visit: Payer: Self-pay

## 2020-06-06 VITALS — BP 114/53 | HR 79 | Temp 98.4°F | Wt 225.0 lb

## 2020-06-06 DIAGNOSIS — I700 Atherosclerosis of aorta: Secondary | ICD-10-CM

## 2020-06-06 DIAGNOSIS — I5032 Chronic diastolic (congestive) heart failure: Secondary | ICD-10-CM | POA: Diagnosis not present

## 2020-06-06 DIAGNOSIS — N182 Chronic kidney disease, stage 2 (mild): Secondary | ICD-10-CM | POA: Diagnosis not present

## 2020-06-06 DIAGNOSIS — M542 Cervicalgia: Secondary | ICD-10-CM

## 2020-06-06 DIAGNOSIS — N750 Cyst of Bartholin's gland: Secondary | ICD-10-CM

## 2020-06-06 DIAGNOSIS — R1314 Dysphagia, pharyngoesophageal phase: Secondary | ICD-10-CM

## 2020-06-06 DIAGNOSIS — I10 Essential (primary) hypertension: Secondary | ICD-10-CM

## 2020-06-06 DIAGNOSIS — E1122 Type 2 diabetes mellitus with diabetic chronic kidney disease: Secondary | ICD-10-CM

## 2020-06-06 DIAGNOSIS — M549 Dorsalgia, unspecified: Secondary | ICD-10-CM

## 2020-06-06 DIAGNOSIS — E118 Type 2 diabetes mellitus with unspecified complications: Secondary | ICD-10-CM

## 2020-06-06 DIAGNOSIS — D638 Anemia in other chronic diseases classified elsewhere: Secondary | ICD-10-CM

## 2020-06-06 DIAGNOSIS — I110 Hypertensive heart disease with heart failure: Secondary | ICD-10-CM

## 2020-06-06 DIAGNOSIS — E1140 Type 2 diabetes mellitus with diabetic neuropathy, unspecified: Secondary | ICD-10-CM

## 2020-06-06 DIAGNOSIS — M85852 Other specified disorders of bone density and structure, left thigh: Secondary | ICD-10-CM

## 2020-06-06 DIAGNOSIS — E559 Vitamin D deficiency, unspecified: Secondary | ICD-10-CM

## 2020-06-06 DIAGNOSIS — K219 Gastro-esophageal reflux disease without esophagitis: Secondary | ICD-10-CM

## 2020-06-06 DIAGNOSIS — E1142 Type 2 diabetes mellitus with diabetic polyneuropathy: Secondary | ICD-10-CM

## 2020-06-06 DIAGNOSIS — M85851 Other specified disorders of bone density and structure, right thigh: Secondary | ICD-10-CM

## 2020-06-06 DIAGNOSIS — G8929 Other chronic pain: Secondary | ICD-10-CM

## 2020-06-06 DIAGNOSIS — K909 Intestinal malabsorption, unspecified: Secondary | ICD-10-CM

## 2020-06-06 DIAGNOSIS — I130 Hypertensive heart and chronic kidney disease with heart failure and stage 1 through stage 4 chronic kidney disease, or unspecified chronic kidney disease: Secondary | ICD-10-CM

## 2020-06-06 DIAGNOSIS — R1319 Other dysphagia: Secondary | ICD-10-CM

## 2020-06-06 DIAGNOSIS — E538 Deficiency of other specified B group vitamins: Secondary | ICD-10-CM

## 2020-06-07 ENCOUNTER — Encounter: Payer: Self-pay | Admitting: Internal Medicine

## 2020-06-07 DIAGNOSIS — N907 Vulvar cyst: Secondary | ICD-10-CM

## 2020-06-07 HISTORY — DX: Vulvar cyst: N90.7

## 2020-06-07 MED ORDER — VITAMIN D (ERGOCALCIFEROL) 1.25 MG (50000 UNIT) PO CAPS: ORAL_CAPSULE | ORAL | 0 refills | Status: DC

## 2020-06-07 MED ORDER — METFORMIN HCL ER (MOD) 1000 MG PO TB24: 2000.0000 mg | ORAL_TABLET | Freq: Every day | ORAL | 11 refills | Status: DC

## 2020-06-07 MED ORDER — OMEPRAZOLE 20 MG PO CPDR: 20.0000 mg | DELAYED_RELEASE_CAPSULE | Freq: Every day | ORAL | 2 refills | Status: DC

## 2020-06-07 NOTE — Assessment & Plan Note (Addendum)
Well-controlled on current regimen (amlodipine 5 mg daily, atenolol 100 mg daily, benazepril 40 mg daily, Lasix 40 mg daily, Imdur 30 mg daily); continue.

## 2020-06-07 NOTE — Progress Notes (Signed)
Established Patient Office Visit  Subjective:  Patient ID: [name] [name], female    DOB: 05/07/1946  Age: 74 y.o. MRN: [id number]  CC: Chronic pain   HPI [name] [name] presents for follow-up of chronic pain and to continue discussion of other comorbidities begun 1 month ago at our first visit together.  Ms. Gunnoe reports that she is actually doing better.  At last visit I encouraged her to increase her Tylenol from twice a day to 3 times a day, which has helped significantly.  Her current dose of pregabalin is controlling her neuropathy pain.  Her as needed Ultram has not been needed as frequently.  In review of her chronic conditions, we noted that she had been taking a high-dose PPI twice daily for some time, and was experiencing no GERD symptoms.  She was advised to discontinue her evening dose, and has been doing well with this change, with no breakthrough GERD symptoms.  Review review of chart reveals that at the end of July, she contacted her cardiology office to report that her Actos was causing her to feel achy.  She was advised to contact our clinic, which she did not do (instead deciding to stop it herself, and planning to discuss it today).  She recalls that she began feeling achy all over as soon as the medication was started in April, and when she discontinue it, her achiness resolved.  This discomfort is distinct from her chronic neck and back pain and neuropathy pain.  In further discussion, she notes that she does not want to take Metformin any longer, as it has caused her to have chronic diarrhea which she had not brought to the attention of her providers before, "I just did not think there was anything that could be done about it, but I am tired of running to the bathroom all the time".  Past Medical History:  Diagnosis Date  . 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 Anemia of chronic disease   . Arthritis   . 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 Katy Fitch annual f/u, most recent 09/2019  . CAD, non-obstructive 08/26/2006   Cath 07/05:multiple areas of nonobstructive disease = medical & RF mgmt, well preserved global systolic function. Dr. Lia Foyer. Nuclear med stress test 01/2014 : Normal stress nuclear study. LV Ejection Fraction: 76%. LV Wall Motion: NL LV Function; NL Wall Motion.      . Chronic neck and back pain 01/03/2013   Multifactorial. Non opioid requiring.   L2-5 fusion, with hardware at L2-3, stable per MRI 2010. Followed by neurosurgery (Dr. 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    . CKD stage 2 due to type 2 diabetes mellitus ([place]) 12/11/2016  . DEPRESSION 08/26/2006   On paxil    . Diabetic neuropathy, type II diabetes mellitus ([place])   . Diabetic retinopathy associated with controlled type 2 diabetes mellitus ([place]) 09/2019   with  macular edema OS - Dr. Katy Fitch  . DYSLIPIDEMIA 11/01/2006  . Esophageal stenosis s/p dilatation   . Esophageal stenosis s/p dilatation   . Gallstones   . GERD 08/26/2006   Heartburn QHS.     [name] Hx of blood transfusion without reported diagnosis   . HYPERTENSION 08/26/2006  . Hypertensive heart disease with chronic diastolic congestive heart failure ([place]) 05/01/2020  . Morbid obesity with BMI of 50.0-59.9, adult ([place])   . Personal history of colonic polyps 2020  Adenomatous; f/u 2023 [facility]  . PVD (peripheral vascular disease) ([place]), resolved per f/u ABIs 2016 03/27/2014   2015 LE arterial Duplex - Possible mild inflow disease on the left. 0-49% bilateral SFA disease, without focal stenosis. Three vesel run-off, bilaterally. Medical tx.   . S/P bilateral cataract extraction   . Small intestinal bacterial overgrowth 12/24/2015   Presumed / clinical dx 2016. Complete resolution with metronidazole (insuance would not cover rifaximin) Flare 2017. Did not respond to ABX. Referral to GI B12 and ferritin low supporting  malabsorption.   . Type 2 diabetes mellitus with complications ([place]) 43/15/4008    Past Surgical History:  Procedure Laterality Date  . CATARACT EXTRACTION, BILATERAL    . CHOLECYSTECTOMY     pt denies 04/07/16  . COLONOSCOPY    . endometrial biospy    . ESOPHAGEAL DILATION    . LEFT HEART CATH    . lumbar fusion surgery  10/08   L3-L5  . POLYPECTOMY    . posterior lumbar interfusion surgery  07/18/07   L2-L3  . rotator cuff surgery Bilateral     Outpatient Medications Prior to Visit  Medication Sig Dispense Refill  . Accu-Chek FastClix Lancets MISC USE THREE TIMES DAILY. 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 amLODipine (NORVASC) 5 MG tablet TAKE 1 TABLET(5 MG) BY MOUTH DAILY 90 tablet 3  . aspirin 81 MG tablet Take 1 tablet (81 mg total) by mouth daily. 90 tablet 3  . atenolol (TENORMIN) 100 MG tablet TAKE 1 TABLET(100 MG) BY MOUTH DAILY 90 tablet 3  . benazepril (LOTENSIN) 40 MG tablet TAKE 1 TABLET(40 MG) BY MOUTH DAILY 90 tablet 3  . Blood Glucose Monitoring Suppl (ACCU-CHEK GUIDE) w/Device KIT 1 each by Does not apply route 3 (three) times daily. 1 kit 0  . furosemide (LASIX) 40 MG tablet TAKE 1 TABLET BY MOUTH EVERY DAY 90 tablet 3  . glucose blood (ACCU-CHEK GUIDE) test strip Use to test blood glucose 3 times daily. Dx E11.65 100 strip 5  . Insulin Pen Needle (BD PEN NEEDLE NANO U/F) 32G X 4 MM MISC USE TO INJECT VICTOZA ONCE A DAY AND LANTUS ONCE A DAY AS DIRECTED 100 each 5  . isosorbide mononitrate (IMDUR) 30 MG 24 hr tablet TAKE 1 TABLET BY MOUTH ONCE DAILY 90 tablet 1  . liraglutide (VICTOZA) 18 MG/3ML SOPN ADMINISTER 1.8 MG UNDER THE SKIN DAILY 18 mL 5  . loratadine (CLARITIN) 10 MG tablet TAKE 1 TABLET(10 MG) BY MOUTH DAILY 90 tablet 3  . nitroGLYCERIN (NITROSTAT) 0.4 MG SL tablet PLACE 1 TABLET UNDER THE TONGUE EVERY 5 MINUTES AS NEEDED FOR CHEST PAIN 25 tablet 0  . PARoxetine (PAXIL) 40 MG tablet Take 1 tablet (40 mg total) by mouth every morning. 90  tablet 0  . pregabalin (LYRICA) 150 MG capsule TAKE 1 CAPSULE(150 MG) BY MOUTH DAILY 90 capsule 3  . rosuvastatin (CRESTOR) 10 MG tablet TAKE 1 TABLET(10 MG) BY MOUTH DAILY 90 tablet 3  . traMADol (ULTRAM) 50 MG tablet Take 1 tablet (50 mg total) by mouth 2 (two) times daily as needed for severe pain. 90 tablet 0  . metFORMIN (GLUCOPHAGE) 1000 MG tablet TAKE 1 TABLET(1000 MG) BY MOUTH TWICE DAILY WITH A MEAL 180 tablet 3  . omeprazole (PRILOSEC) 40 MG capsule Take 1 capsule (40 mg total) by mouth daily. 90 capsule 3  . pioglitazone (ACTOS) 15 MG tablet TAKE 1 TABLET(15 MG) BY MOUTH DAILY 90 tablet 3  .  vitamin B-12 (CYANOCOBALAMIN) 1000 MCG tablet Take 1,000 mcg by mouth daily.     No facility-administered medications prior to visit.    No Known Allergies  ROS:  Feels generally improved from last visit, as described above.  Appetite is good, sleeping is fair.  No difficulty breathing and no chest tightness or discomfort.  The diarrhea described above is characterized as loose, painless, watery diarrhea without blood, melena, or abnormal color.  She further describes a concern about a bump in her private area.  She has noticed this only during hygiene and wiping, it is painless, and feels like a "grape".   Objective:    Physical Exam  BP (!) 114/53 (BP Location: Right Arm, Patient Position: Sitting, Cuff Size: Normal)   Pulse 79   Temp 98.4 F (36.9 C) (Oral)   Wt 225 lb (102.1 kg)   SpO2 98%   BMI 54.25 kg/m  Wt Readings from Last 3 Encounters:  06/06/20 225 lb (102.1 kg)  05/02/20 226 lb 1.6 oz (102.6 kg)  04/18/20 226 lb 12.8 oz (102.9 kg)   Positive, optimistic affect, engages in conversation easily.  No JVD, lungs are clear, heart regular rate and rhythm.  Lower extremities without edema.  Dorsalis pedis pulses are each 2+ and her foot skin/nails are in good condition.  Health Maintenance Due  Topic Date Due  . LIPID PANEL  12/07/2019  . INFLUENZA VACCINE  05/12/2020     There are no preventive care reminders to display for this patient.  Lab Results  Component Value Date   TSH 3.530 05/02/2020   Lab Results  Component Value Date   NA 141 05/02/2020   K 4.7 05/02/2020   CO2 24 05/02/2020   GLUCOSE 121 (H) 05/02/2020   BUN 24 05/02/2020   CREATININE 0.83 05/02/2020   Lab Results  Component Value Date   HGBA1C 7.2 (A) 05/02/2020      Assessment & Plan:   Ms. Sanjurjo is pleased with her progress.  Chronic pain issues are under improved control, and no changes are anticipated for now.  Blood pressure and diabetes are well controlled at this time, though attention will be directed to altering her glycemia management to address her side effects.  She notes that she was not taking the Actos at the time her A1c was obtained in July (7.2), at which time it was well controlled.  We reviewed lab results from last visit, including new problem of vitamin D deficiency and the need to supplement this to normal levels.  She is in agreement.  See problem based charting.   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   Follow-up:  54M (due for A1C, will also obtain CBC and lipids)    Angelica Pou, MD

## 2020-06-07 NOTE — Assessment & Plan Note (Signed)
eGFR remains greater than 60.  She was able to tell me that she does not need to follow-up with [facility] any longer due to her stability.  Continue the ACE inhibitor for renal protection.

## 2020-06-07 NOTE — Assessment & Plan Note (Signed)
Much improvement when APAP twice daily was increased to 3 times daily.  She is pleased, and using the tramadol less frequently.  No change today.

## 2020-06-07 NOTE — Assessment & Plan Note (Addendum)
[name] [name] stopped her Actos over a month ago, as it was causing achiness, which resolved upon stopping the medication.  A1c of 7.1 in 04/2020 was without this medicine.  She also wishes to stop Metformin due to chronic diarrhea.  Will change short acting Metformin to the longer acting formulation which has less likelihood of GI side effects.  She will be due for A1c in 2 more months and we will make a decision about additional alterations at that time.  She is currently on Victoza 1.8 alone.  Lantus was discontinued at last visit to simplify her regimen, although at that time she had not notified me that she had stopped the Actos. Insulin may need to be resumed in the future.

## 2020-06-07 NOTE — Assessment & Plan Note (Signed)
Well-controlled with pregabalin 150 mg daily, which she is tolerating well without side effects.  Continue.

## 2020-06-07 NOTE — Assessment & Plan Note (Signed)
Well-controlled, asymptomatic, continue current plan.

## 2020-06-07 NOTE — Assessment & Plan Note (Signed)
Asymptomatic.  Distal pulses strong in feet.  Continue antiplatelet and CVD risk factor management.

## 2020-06-07 NOTE — Assessment & Plan Note (Signed)
25 hydroxy vitamin D level 12.5 last month.  High-dose supplementation initiated.  Recheck in about 6 months.

## 2020-06-07 NOTE — Assessment & Plan Note (Signed)
Remains asymptomatic.  History of dilatation.

## 2020-06-07 NOTE — Assessment & Plan Note (Signed)
This is a dated diagnosis, though new finding of vit D deficiency will be treated.

## 2020-06-07 NOTE — Assessment & Plan Note (Signed)
At last visit, omeprazole 40 mg twice daily was reduced to once daily in the morning only.  No breakthrough symptoms.  Next step is reduction in dose to 20 mg daily, after which we will attempt transition to H2 B.  Rebound GERD has been known to occur upon abrupt cessation of PPI.

## 2020-06-07 NOTE — Assessment & Plan Note (Signed)
B12 level overly supplemented; will discontinue at this time, recheck in 83M.

## 2020-06-14 ENCOUNTER — Telehealth: Payer: Self-pay | Admitting: *Deleted

## 2020-06-14 NOTE — Telephone Encounter (Signed)
Please call patient about her medicines.  She has some questions.

## 2020-06-14 NOTE — Telephone Encounter (Signed)
Attempted return call to patient-no answer, mailbox full-unable to leave message.[name] Hidden Cassady9/3/20213:41 PM

## 2020-06-20 ENCOUNTER — Other Ambulatory Visit: Payer: Self-pay | Admitting: *Deleted

## 2020-06-20 DIAGNOSIS — E1165 Type 2 diabetes mellitus with hyperglycemia: Secondary | ICD-10-CM

## 2020-06-20 DIAGNOSIS — IMO0002 Reserved for concepts with insufficient information to code with codable children: Secondary | ICD-10-CM

## 2020-06-20 DIAGNOSIS — E1151 Type 2 diabetes mellitus with diabetic peripheral angiopathy without gangrene: Secondary | ICD-10-CM

## 2020-06-20 MED ORDER — BD PEN NEEDLE NANO U/F 32G X 4 MM MISC: 5 refills | Status: DC

## 2020-06-26 ENCOUNTER — Other Ambulatory Visit: Payer: Self-pay | Admitting: *Deleted

## 2020-06-26 DIAGNOSIS — IMO0002 Reserved for concepts with insufficient information to code with codable children: Secondary | ICD-10-CM

## 2020-06-26 DIAGNOSIS — E1151 Type 2 diabetes mellitus with diabetic peripheral angiopathy without gangrene: Secondary | ICD-10-CM

## 2020-06-26 DIAGNOSIS — E1165 Type 2 diabetes mellitus with hyperglycemia: Secondary | ICD-10-CM

## 2020-06-26 MED ORDER — ACCU-CHEK FASTCLIX LANCETS MISC: 11 refills | Status: DC

## 2020-06-28 ENCOUNTER — Telehealth: Payer: Self-pay

## 2020-06-28 DIAGNOSIS — E118 Type 2 diabetes mellitus with unspecified complications: Secondary | ICD-10-CM

## 2020-06-28 NOTE — Telephone Encounter (Signed)
I can represcribe the 500s and have her take 4 each day.

## 2020-06-28 NOTE — Telephone Encounter (Signed)
Received TC from patient who c/o continuing diarrhea and states she spoke to her PCP at her LOV. Per LOV note w/ PCP, Metformin short acting medication was to be changed to metformin long acting medication, 2000mg  daily with breakfast and RX was sent to pharmacy on 06/07/20.  Patient asked if she is taking her new, long acting metformin RX and she states she did not receive this RX when she went to the pharmacy.  TC to [facility] and spoke with pharmacist.  RN informed patient's insurance will not cover Metformin 1000mg  24 hr dose, but will cover Metformin 500mg  ER dose.  Will forward to PCP to advise.   SChaplin, RN,BSN

## 2020-07-02 MED ORDER — METFORMIN HCL ER 500 MG PO TB24: 2000.0000 mg | ORAL_TABLET | Freq: Every day | ORAL | 2 refills | Status: DC

## 2020-07-02 NOTE — Telephone Encounter (Signed)
TC to patient to inform her about switching metformin 1000mg  XR to metformin 500mg  XR, so insurance would cover.  VM obtained that machine was full and RN was unable to leave message. 2 attempts made. SChaplin, RN,BSN

## 2020-07-02 NOTE — Telephone Encounter (Signed)
Hi Dr. Jimmye Norman,  I don't see where the 75's were sent?  If you could please send the new RX and update the med list, I will be happy to call the patient and let her know. Thank you! SChaplin, RN,BSN

## 2020-07-02 NOTE — Telephone Encounter (Signed)
Sent to [facility] on [address], please let me know if this isn't successful for some reason.

## 2020-07-22 ENCOUNTER — Ambulatory Visit: Payer: Medicare Other | Admitting: Podiatry

## 2020-07-30 ENCOUNTER — Other Ambulatory Visit: Payer: Self-pay

## 2020-07-30 MED ORDER — BENAZEPRIL HCL 40 MG PO TABS
ORAL_TABLET | ORAL | 3 refills | Status: DC
Start: 2020-07-30 — End: 2020-12-19

## 2020-08-05 ENCOUNTER — Telehealth: Payer: Self-pay | Admitting: *Deleted

## 2020-08-05 NOTE — Telephone Encounter (Signed)
Information was sent through CoverMyMeds for [place] for Victoza.  Awaiting determination.  Sander Nephew, RN 08/05/2020 3:59 PM.

## 2020-08-07 ENCOUNTER — Telehealth: Payer: Self-pay

## 2020-08-07 NOTE — Telephone Encounter (Signed)
Hi Dr. Jimmye Norman,  I received a RX refill request from [facility] for Lantus Solostar Pen (inject 8 units).  Per chart review, you discontinued the Lantus.  This patient has an appt with you tomorrow.  Though I would let you know, I'm not sure if it's an automatic refill or if she has some confusion regarding her meds. Thanks, Higinio Roger, RN,BSN

## 2020-08-08 ENCOUNTER — Encounter: Payer: Self-pay | Admitting: Internal Medicine

## 2020-08-08 ENCOUNTER — Ambulatory Visit (INDEPENDENT_AMBULATORY_CARE_PROVIDER_SITE_OTHER): Payer: Medicare Other | Admitting: Internal Medicine

## 2020-08-08 VITALS — BP 130/60 | HR 72 | Temp 98.3°F | Ht <= 58 in | Wt 224.5 lb

## 2020-08-08 DIAGNOSIS — I700 Atherosclerosis of aorta: Secondary | ICD-10-CM

## 2020-08-08 DIAGNOSIS — I5032 Chronic diastolic (congestive) heart failure: Secondary | ICD-10-CM

## 2020-08-08 DIAGNOSIS — M542 Cervicalgia: Secondary | ICD-10-CM

## 2020-08-08 DIAGNOSIS — E559 Vitamin D deficiency, unspecified: Secondary | ICD-10-CM

## 2020-08-08 DIAGNOSIS — D508 Other iron deficiency anemias: Secondary | ICD-10-CM | POA: Diagnosis not present

## 2020-08-08 DIAGNOSIS — Z0001 Encounter for general adult medical examination with abnormal findings: Secondary | ICD-10-CM | POA: Diagnosis not present

## 2020-08-08 DIAGNOSIS — E785 Hyperlipidemia, unspecified: Secondary | ICD-10-CM

## 2020-08-08 DIAGNOSIS — Z23 Encounter for immunization: Secondary | ICD-10-CM | POA: Diagnosis not present

## 2020-08-08 DIAGNOSIS — M549 Dorsalgia, unspecified: Secondary | ICD-10-CM

## 2020-08-08 DIAGNOSIS — I10 Essential (primary) hypertension: Secondary | ICD-10-CM

## 2020-08-08 DIAGNOSIS — Z794 Long term (current) use of insulin: Secondary | ICD-10-CM

## 2020-08-08 DIAGNOSIS — E782 Mixed hyperlipidemia: Secondary | ICD-10-CM

## 2020-08-08 DIAGNOSIS — E118 Type 2 diabetes mellitus with unspecified complications: Secondary | ICD-10-CM | POA: Diagnosis not present

## 2020-08-08 DIAGNOSIS — K909 Intestinal malabsorption, unspecified: Secondary | ICD-10-CM

## 2020-08-08 DIAGNOSIS — F325 Major depressive disorder, single episode, in full remission: Secondary | ICD-10-CM

## 2020-08-08 DIAGNOSIS — I110 Hypertensive heart disease with heart failure: Secondary | ICD-10-CM

## 2020-08-08 DIAGNOSIS — Z0000 Encounter for general adult medical examination without abnormal findings: Secondary | ICD-10-CM

## 2020-08-08 DIAGNOSIS — N907 Vulvar cyst: Secondary | ICD-10-CM

## 2020-08-08 DIAGNOSIS — K219 Gastro-esophageal reflux disease without esophagitis: Secondary | ICD-10-CM

## 2020-08-08 DIAGNOSIS — G8929 Other chronic pain: Secondary | ICD-10-CM

## 2020-08-08 DIAGNOSIS — F320 Major depressive disorder, single episode, mild: Secondary | ICD-10-CM

## 2020-08-08 DIAGNOSIS — E1140 Type 2 diabetes mellitus with diabetic neuropathy, unspecified: Secondary | ICD-10-CM

## 2020-08-08 DIAGNOSIS — E538 Deficiency of other specified B group vitamins: Secondary | ICD-10-CM

## 2020-08-08 LAB — POCT GLYCOSYLATED HEMOGLOBIN (HGB A1C): Hemoglobin A1C: 7.3 % — AB (ref 4.0–5.6)

## 2020-08-08 LAB — GLUCOSE, CAPILLARY: Glucose-Capillary: 140 mg/dL — ABNORMAL HIGH (ref 70–99)

## 2020-08-08 MED ORDER — FAMOTIDINE 20 MG PO TABS: 20.0000 mg | ORAL_TABLET | Freq: Two times a day (BID) | ORAL | 6 refills | Status: DC | PRN

## 2020-08-08 NOTE — Progress Notes (Signed)
Established Patient Office Visit  Subjective:  Patient ID: [name] [name], female    DOB: 1946/03/25  Age: 74 y.o. MRN: [id number]  CC: No chief complaint on file.  HPI [name] presents for f/u of HTN and for DM2 management (actos stopped by pt due to side effects; metformin SA changed to LA; f/u diarrhea; due for A1c.  Lantus was stopped several months ago to simplify regimen, will need to determine whether restart is necessary); f/u of GERD sxs ("Next step is reduction in dose to 20 mg daily, after which we will attempt transition to H2 B.  Rebound GERD has been known to occur upon abrupt cessation of PPI." as copied from last visit); and routine health maintenance (due for annual flu shot and for 10 yr TDaP update).  Had also planned at today's visit to f/u anemia (CBC) and lipids for which she was due for recheck.  She unfortunately has had ongoing diarrhea despite changing formulation of metformin.  Loose to watery brown with no associated symptoms. Negatively impacting leaving the house for fear of incontinence.    Wants an examination of the L labial vulva bump she reported at last visit.  Otherwise is doing well.  Past Medical History:  Diagnosis Date  . 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 Anemia of chronic disease   . Arthritis   . 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 Katy Fitch annual f/u, most recent 09/2019  . CAD, non-obstructive 08/26/2006   Cath 07/05:multiple areas of nonobstructive disease = medical & RF mgmt, well preserved global systolic function. Dr. Lia Foyer. Nuclear med stress test 01/2014 : Normal stress nuclear study. LV Ejection Fraction: 76%. LV Wall Motion: NL LV Function; NL Wall Motion.      . Chronic neck and back pain 01/03/2013   Multifactorial. Non opioid requiring.   L2-5 fusion, with hardware at L2-3, stable per MRI 2010. Followed by neurosurgery (Dr. Ronnald Ramp) Cervical MRI 2010 : Disc herniation at C6-7 L>R; possible  compression/irritation C8 nerve root L>R.    . CKD stage 2 due to type 2 diabetes mellitus ([place]) 12/11/2016  . DEPRESSION 08/26/2006   On paxil    . Diabetic peripheral neuropathy associated with type 2 diabetes mellitus ([place])   . Diabetic retinopathy associated with controlled type 2 diabetes mellitus ([place]) 09/2019   with  macular edema OS - Dr. Katy Fitch  . DYSLIPIDEMIA 11/01/2006  . Gallstones   . GERD 08/26/2006   Heartburn QHS.     [name] Hx of blood transfusion without reported diagnosis   . hx of esophageal stenosis s/p dilatation   . Hx of small intestinal bacterial overgrowth 12/24/2015   Presumed / clinical dx 2016. Complete resolution with metronidazole (insuance would not cover rifaximin) Flare 2017. Did not respond to ABX. Referral to GI B12 and ferritin low supporting malabsorption.   [name] HYPERTENSION 08/26/2006  . Hypertensive heart disease with chronic diastolic congestive heart failure ([place]) 05/01/2020  . Morbid obesity with BMI of 50.0-59.9, adult ([place])   . Personal history of colonic polyps 2020   Adenomatous; f/u 2023 [facility]  . PVD (peripheral vascular disease) ([place]), resolved per f/u ABIs 2016 03/27/2014   2015 LE arterial Duplex - Possible mild inflow disease on the left. 0-49% bilateral SFA disease, without focal stenosis. Three vesel run-off, bilaterally. Medical tx.   . S/P bilateral cataract extraction   . Type 2 diabetes mellitus with complications ([place]) 39/53/2023    Outpatient Medications Prior to  Visit  Medication Sig Dispense Refill  . Accu-Chek FastClix Lancets MISC USE THREE TIMES DAILY. 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 amLODipine (NORVASC) 5 MG tablet TAKE 1 TABLET(5 MG) BY MOUTH DAILY 90 tablet 3  . aspirin 81 MG tablet Take 1 tablet (81 mg total) by mouth daily. 90 tablet 3  . atenolol (TENORMIN) 100 MG tablet TAKE 1 TABLET(100 MG) BY MOUTH DAILY 90 tablet 3  . benazepril (LOTENSIN) 40 MG tablet TAKE 1 TABLET(40 MG) BY MOUTH DAILY 90 tablet 3   . Blood Glucose Monitoring Suppl (ACCU-CHEK GUIDE) w/Device KIT 1 each by Does not apply route 3 (three) times daily. 1 kit 0  . furosemide (LASIX) 40 MG tablet TAKE 1 TABLET BY MOUTH EVERY DAY 90 tablet 3  . glucose blood (ACCU-CHEK GUIDE) test strip Use to test blood glucose 3 times daily. Dx E11.65 100 strip 5  . Insulin Pen Needle (BD PEN NEEDLE NANO U/F) 32G X 4 MM MISC USE TO INJECT VICTOZA ONCE A DAY AND LANTUS ONCE A DAY AS DIRECTED 100 each 5  . isosorbide mononitrate (IMDUR) 30 MG 24 hr tablet TAKE 1 TABLET BY MOUTH ONCE DAILY 90 tablet 1  . liraglutide (VICTOZA) 18 MG/78ML SOPN ADMINISTER 1.8 MG UNDER THE SKIN DAILY 18 mL 5  . loratadine (CLARITIN) 10 MG tablet TAKE 1 TABLET(10 MG) BY MOUTH DAILY 90 tablet 3  . metFORMIN (GLUCOPHAGE XR) 500 MG 24 hr tablet Take 4 tablets (2,000 mg total) by mouth daily with breakfast. 120 tablet 2  . nitroGLYCERIN (NITROSTAT) 0.4 MG SL tablet PLACE 1 TABLET UNDER THE TONGUE EVERY 5 MINUTES AS NEEDED FOR CHEST PAIN 25 tablet 0  . omeprazole (PRILOSEC) 20 MG capsule Take 1 capsule (20 mg total) by mouth daily. 30 capsule 2  . PARoxetine (PAXIL) 40 MG tablet Take 1 tablet (40 mg total) by mouth every morning. 90 tablet 0  . pregabalin (LYRICA) 150 MG capsule TAKE 1 CAPSULE(150 MG) BY MOUTH DAILY 90 capsule 3  . rosuvastatin (CRESTOR) 10 MG tablet TAKE 1 TABLET(10 MG) BY MOUTH DAILY 90 tablet 3  . traMADol (ULTRAM) 50 MG tablet Take 1 tablet (50 mg total) by mouth 2 (two) times daily as needed for severe pain. 90 tablet 0  . Vitamin D, Ergocalciferol, (DRISDOL) 1.25 MG (50000 UNIT) CAPS capsule Take 1 capsule (50,000 Units total) by mouth every 7 (seven) days for 28 days, THEN 1 capsule (50,000 Units total) every 30 (thirty) days. 10 capsule 0   No Known Allergies  ROS Review of Systems    Objective:    Physical Exam Wt 224#, 130/60, 72, 98% RA  Wt Readings from Last 3 Encounters:  06/06/20 225 lb (102.1 kg)  05/02/20 226 lb 1.6 oz (102.6 kg)   04/18/20 226 lb 12.8 oz (102.9 kg)  1 cm rubbery nodule subdermal located just lateral to L labia majora.  NO surrounding inflammation, nontender, not draining (though it has drained in the past).  No LE edema.  Positive affect, interactive and appropriate.     Assessment & Plan:  See problem based documentation of items addressed today. Received flu shot and TDaP. Next visit 11/2019: A1C, vit D, BMP, B12  Follow-up:  78M    Angelica Pou, MD

## 2020-08-08 NOTE — Patient Instructions (Addendum)
Ms. Bergsma, Always a pleasure to see you!  Today we discussed your ongoing diarrhea and we are going to stop the metformin to see if that relieves the problem.  I'll make contact with you soon about a substitute.  You may take Imodium over the counter as needed for loose stools in the meantime.  We discussed stopping your stomach acid pill and changing to Pepcid/famotidine which can be taken as needed.  Let me know if you have problems with worsening heartburn symptoms.    You received your tetanus update (TDaP) and flu shot today!    Everything is ok in the lady parts.  You have a little cyst which can be left alone as long as it isn't bothering you.    I look forward to seeing you next time and hope the diarrhea is long gone by then!  I'll be in touch by phone about new diabetes medicine once I make a decision.  Take care and stay well,  Dr. Jimmye Norman

## 2020-08-08 NOTE — Telephone Encounter (Signed)
You're correct, it was stopped months ago and today we'll determine whether it needs to be resumed.  For now I will deny the refill.

## 2020-08-09 LAB — LIPID PANEL
Chol/HDL Ratio: 3.5 ratio (ref 0.0–4.4)
Cholesterol, Total: 197 mg/dL (ref 100–199)
HDL: 56 mg/dL (ref >39)
LDL Chol Calc (NIH): 112 mg/dL — ABNORMAL HIGH (ref 0–99)
Triglycerides: 166 mg/dL — ABNORMAL HIGH (ref 0–149)
VLDL Cholesterol Cal: 29 mg/dL (ref 5–40)

## 2020-08-09 LAB — CBC
Hematocrit: 35.3 % (ref 34.0–46.6)
Hemoglobin: 11.9 g/dL (ref 11.1–15.9)
MCH: 29.8 pg (ref 26.6–33.0)
MCHC: 33.7 g/dL (ref 31.5–35.7)
MCV: 89 fL (ref 79–97)
Platelets: 305 10*3/uL (ref 150–450)
RBC: 3.99 x10E6/uL (ref 3.77–5.28)
RDW: 15.6 % — ABNORMAL HIGH (ref 11.7–15.4)
WBC: 4.4 10*3/uL (ref 3.4–10.8)

## 2020-08-12 DIAGNOSIS — F320 Major depressive disorder, single episode, mild: Secondary | ICD-10-CM | POA: Insufficient documentation

## 2020-08-12 MED ORDER — PAROXETINE HCL 40 MG PO TABS: 40.0000 mg | ORAL_TABLET | Freq: Every morning | ORAL | 3 refills | Status: DC

## 2020-08-12 MED ORDER — FUROSEMIDE 40 MG PO TABS: 40.0000 mg | ORAL_TABLET | Freq: Every day | ORAL | 3 refills | Status: DC

## 2020-08-12 MED ORDER — TRAMADOL HCL 50 MG PO TABS: 50.0000 mg | ORAL_TABLET | Freq: Two times a day (BID) | ORAL | 0 refills | Status: DC | PRN

## 2020-08-12 MED ORDER — PAROXETINE HCL 40 MG PO TABS: 40.0000 mg | ORAL_TABLET | Freq: Every morning | ORAL | 0 refills | Status: DC

## 2020-08-12 MED ORDER — PREGABALIN 150 MG PO CAPS: ORAL_CAPSULE | ORAL | 3 refills | Status: DC

## 2020-08-12 MED ORDER — CANAGLIFLOZIN 100 MG PO TABS: 100.0000 mg | ORAL_TABLET | Freq: Every day | ORAL | 5 refills | Status: DC

## 2020-08-12 MED ORDER — ROSUVASTATIN CALCIUM 20 MG PO TABS: 20.0000 mg | ORAL_TABLET | Freq: Every day | ORAL | 3 refills | Status: DC

## 2020-08-12 NOTE — Addendum Note (Signed)
Addended by: Gaylyn Rong on: 08/12/2020 01:52 PM   Modules accepted: Orders

## 2020-08-12 NOTE — Assessment & Plan Note (Signed)
Controlled at goal, continue current regimen.  Chronic and stable.

## 2020-08-12 NOTE — Assessment & Plan Note (Signed)
Asymptomatic.  Ready to change from PPI to prn H2B famotidine.  Monitor for recurrent symptoms.

## 2020-08-12 NOTE — Assessment & Plan Note (Signed)
Recheck B12 11/2019

## 2020-08-12 NOTE — Assessment & Plan Note (Signed)
Examined today - not bothering her, but she would like it checked out.  1 cm rubbery nodule subdermal located just lateral to L labia majora.  No surrounding inflammation, nontender, not draining (though it has drained in the past).  Simple cyst. No intervention needed at this time.

## 2020-08-12 NOTE — Assessment & Plan Note (Signed)
Recheck D 11/2019 following high dose supplementation.

## 2020-08-12 NOTE — Assessment & Plan Note (Signed)
Lipid test today. Currently on rosuvastatin 10 mg daily.

## 2020-08-12 NOTE — Assessment & Plan Note (Signed)
A1c 7.3 today though due to ongoing watery diarrhea presumed 2/2 metformin, will replace it with SGLT2i.

## 2020-08-12 NOTE — Assessment & Plan Note (Signed)
Mood is stable, positive, with no depressed symptoms at this time.  Continue current regimen.

## 2020-08-12 NOTE — Assessment & Plan Note (Signed)
Well compensated and asymptomatic.  Chronic and stable.  Monitor.

## 2020-08-12 NOTE — Assessment & Plan Note (Signed)
Flu shot and TDaP update today.

## 2020-08-14 ENCOUNTER — Other Ambulatory Visit: Payer: Self-pay | Admitting: Internal Medicine

## 2020-08-14 DIAGNOSIS — Z1231 Encounter for screening mammogram for malignant neoplasm of breast: Secondary | ICD-10-CM

## 2020-08-16 ENCOUNTER — Other Ambulatory Visit: Payer: Self-pay

## 2020-08-16 ENCOUNTER — Ambulatory Visit (INDEPENDENT_AMBULATORY_CARE_PROVIDER_SITE_OTHER): Payer: Medicare Other | Admitting: Podiatry

## 2020-08-16 ENCOUNTER — Encounter: Payer: Self-pay | Admitting: Podiatry

## 2020-08-16 DIAGNOSIS — B351 Tinea unguium: Secondary | ICD-10-CM

## 2020-08-16 DIAGNOSIS — M79675 Pain in left toe(s): Secondary | ICD-10-CM | POA: Diagnosis not present

## 2020-08-16 DIAGNOSIS — M79674 Pain in right toe(s): Secondary | ICD-10-CM

## 2020-08-16 DIAGNOSIS — E1142 Type 2 diabetes mellitus with diabetic polyneuropathy: Secondary | ICD-10-CM

## 2020-08-16 DIAGNOSIS — Q72899 Other reduction defects of unspecified lower limb: Secondary | ICD-10-CM

## 2020-08-16 DIAGNOSIS — L84 Corns and callosities: Secondary | ICD-10-CM | POA: Diagnosis not present

## 2020-08-18 NOTE — Progress Notes (Signed)
Subjective:  Patient ID: [name] [name], female    DOB: 08/18/1946,  MRN: [id number]  74 y.o. female presents with at risk foot care with history of diabetic neuropathy and painful callus(es) both feet and painful thick toenails that are difficult to trim. Pain interferes with ambulation. Aggravating factors include wearing enclosed shoe gear. Pain is relieved with periodic professional debridement.   She states she missed her last visit due to not feeling well. She voices no new pedal problems on today's visit.   Review of Systems: Negative except as noted in the HPI.  Past Medical History:  Diagnosis Date  . 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 Anemia of chronic disease   . Arthritis   . 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 Katy Fitch annual f/u, most recent 09/2019  . CAD, non-obstructive 08/26/2006   Cath 07/05:multiple areas of nonobstructive disease = medical & RF mgmt, well preserved global systolic function. Dr. Lia Foyer. Nuclear med stress test 01/2014 : Normal stress nuclear study. LV Ejection Fraction: 76%. LV Wall Motion: NL LV Function; NL Wall Motion.      . Chronic neck and back pain 01/03/2013   Multifactorial. Non opioid requiring.   L2-5 fusion, with hardware at L2-3, stable per MRI 2010. Followed by neurosurgery (Dr. 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    . CKD stage 2 due to type 2 diabetes mellitus ([place]) 12/11/2016  . DEPRESSION 08/26/2006   On paxil    . Diabetic peripheral neuropathy associated with type 2 diabetes mellitus ([place])   . Diabetic retinopathy associated with controlled type 2 diabetes mellitus ([place]) 09/2019   with  macular edema OS - Dr. Katy Fitch  . DYSLIPIDEMIA 11/01/2006  . Gallstones   . GERD 08/26/2006   Heartburn QHS.     [name] Hx of blood transfusion without reported diagnosis   . hx of esophageal stenosis s/p dilatation   . Hx of small intestinal bacterial  overgrowth 12/24/2015   Presumed / clinical dx 2016. Complete resolution with metronidazole (insuance would not cover rifaximin) Flare 2017. Did not respond to ABX. Referral to GI B12 and ferritin low supporting malabsorption.   [name] HYPERTENSION 08/26/2006  . Hypertensive heart disease with chronic diastolic congestive heart failure ([place]) 05/01/2020  . Morbid obesity with BMI of 50.0-59.9, adult ([place])   . Personal history of colonic polyps 2020   Adenomatous; f/u 2023 [facility]  . PVD (peripheral vascular disease) ([place]), resolved per f/u ABIs 2016 03/27/2014   2015 LE arterial Duplex - Possible mild inflow disease on the left. 0-49% bilateral SFA disease, without focal stenosis. Three vesel run-off, bilaterally. Medical tx.   . S/P bilateral cataract extraction   . Type 2 diabetes mellitus with complications ([place]) 53/66/4403   Past Surgical History:  Procedure Laterality Date  . CATARACT EXTRACTION, BILATERAL    . CHOLECYSTECTOMY     pt denies 04/07/16  . COLONOSCOPY    . endometrial biospy    . ESOPHAGEAL DILATION    . LEFT HEART CATH    . lumbar fusion surgery  10/08   L3-L5  . POLYPECTOMY    . posterior lumbar interfusion surgery  07/18/07   L2-L3  . rotator cuff surgery Bilateral    Patient Active Problem List   Diagnosis Date Noted  . 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 Morbid obesity with BMI of 50.0-59.9, adult ([place])   .  Anemia of chronic disease   . Glenohumeral arthritis 01/30/2020  . Arthritis of neck 01/30/2020  . Personal history of colonic polyps 2020  . Esophageal dysphagia 03/03/2018  . Gait instability 03/03/2018  . Aortic atherosclerosis ([place]) 06/17/2017  . Right carotid bruit 04/08/2017  . CKD stage 2 due to type 2 diabetes mellitus ([place]) 12/11/2016  . Diabetic polyneuropathy associated with type 2 diabetes mellitus ([place]) 12/10/2016  . Goals of care, counseling/discussion 01/18/2015  . ASCUS with positive high risk  HPV 08/03/2014  . Hypertensive heart disease with chronic diastolic congestive heart failure ([place]) 12/21/2013  . Healthcare maintenance 04/25/2013  . Chronic neck and back pain 01/03/2013  . Insomnia 12/22/2011  . Vitamin B12 deficiency due to intestinal malabsorption 12/27/2009  . Vitamin D deficiency 12/27/2009  . Osteopenia 08/01/2008  . Hyperlipidemia 11/01/2006  . Depression 08/26/2006  . HTN (hypertension) 08/26/2006  . GERD 08/26/2006  . Diabetic retinopathy ([place]) 08/26/2006  . Type 2 diabetes mellitus with complications ([place]) 41/32/4401    Current Outpatient Medications:  .  Accu-Chek FastClix Lancets MISC, USE THREE TIMES DAILY. 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  amLODipine (NORVASC) 5 MG tablet, TAKE 1 TABLET(5 MG) BY MOUTH DAILY, Disp: 90 tablet, Rfl: 3 .  aspirin 81 MG tablet, Take 1 tablet (81 mg total) by mouth daily., Disp: 90 tablet, Rfl: 3 .  atenolol (TENORMIN) 100 MG tablet, TAKE 1 TABLET(100 MG) BY MOUTH DAILY, Disp: 90 tablet, Rfl: 3 .  benazepril (LOTENSIN) 40 MG tablet, TAKE 1 TABLET(40 MG) BY MOUTH DAILY, Disp: 90 tablet, Rfl: 3 .  Blood Glucose Monitoring Suppl (ACCU-CHEK GUIDE) w/Device KIT, 1 each by Does not apply route 3 (three) times daily., Disp: 1 kit, Rfl: 0 .  canagliflozin (INVOKANA) 100 MG TABS tablet, Take 1 tablet (100 mg total) by mouth daily before breakfast., Disp: 30 tablet, Rfl: 5 .  famotidine (PEPCID) 20 MG tablet, Take 1 tablet (20 mg total) by mouth 2 (two) times daily as needed for heartburn or indigestion., Disp: 45 tablet, Rfl: 6 .  furosemide (LASIX) 40 MG tablet, Take 1 tablet (40 mg total) by mouth daily., Disp: 90 tablet, Rfl: 3 .  glucose blood (ACCU-CHEK GUIDE) test strip, Use to test blood glucose 3 times daily. Dx E11.65, Disp: 100 strip, Rfl: 5 .  Insulin Pen Needle (BD PEN NEEDLE NANO U/F) 32G X 4 MM MISC, USE TO INJECT VICTOZA ONCE A DAY AND LANTUS ONCE A DAY AS DIRECTED, Disp: 100 each, Rfl: 5 .   isosorbide mononitrate (IMDUR) 30 MG 24 hr tablet, TAKE 1 TABLET BY MOUTH ONCE DAILY, Disp: 90 tablet, Rfl: 1 .  liraglutide (VICTOZA) 18 MG/3ML SOPN, ADMINISTER 1.8 MG UNDER THE SKIN DAILY, Disp: 18 mL, Rfl: 5 .  loratadine (CLARITIN) 10 MG tablet, TAKE 1 TABLET(10 MG) BY MOUTH DAILY, Disp: 90 tablet, Rfl: 3 .  nitroGLYCERIN (NITROSTAT) 0.4 MG SL tablet, PLACE 1 TABLET UNDER THE TONGUE EVERY 5 MINUTES AS NEEDED FOR CHEST PAIN, Disp: 25 tablet, Rfl: 0 .  PARoxetine (PAXIL) 40 MG tablet, Take 1 tablet (40 mg total) by mouth every morning., Disp: 90 tablet, Rfl: 3 .  pregabalin (LYRICA) 150 MG capsule, TAKE 1 CAPSULE(150 MG) BY MOUTH DAILY, Disp: 90 capsule, Rfl: 3 .  rosuvastatin (CRESTOR) 20 MG tablet, Take 1 tablet (20 mg total) by mouth daily., Disp: 90 tablet, Rfl: 3 .  traMADol (ULTRAM) 50 MG tablet, Take 1 tablet (50 mg total) by mouth 2 (two)  times daily as needed for severe pain., Disp: 90 tablet, Rfl: 0 .  Vitamin D, Ergocalciferol, (DRISDOL) 1.25 MG (50000 UNIT) CAPS capsule, Take 1 capsule (50,000 Units total) by mouth every 7 (seven) days for 28 days, THEN 1 capsule (50,000 Units total) every 30 (thirty) days., Disp: 10 capsule, Rfl: 0 No Known Allergies Social History   Tobacco Use  Smoking Status Passive Smoke Exposure - Never Smoker  Smokeless Tobacco Never Used    Objective:   Constitutional Pt is a pleasant 74 y.o. African American female obese in NAD.[name] AAO x 3.   Vascular Neurovascular status unchanged b/l lower extremities. Capillary refill time to digits immediate b/l. Palpable DP pulse(s) b/l lower extremities Nonpalpable PT pulse(s) b/l lower extremities. Pedal hair present. Lower extremity skin temperature gradient within normal limits. No pain with calf compression b/l. No edema noted b/l lower extremities. No ischemia or gangrene noted b/l lower extremities. No cyanosis or clubbing noted.  Neurologic Normal speech. Oriented to person, place, and time. Protective  sensation diminished with 10g monofilament b/l. Proprioception intact bilaterally.  Dermatologic Pedal skin with normal turgor, texture and tone bilaterally. No open wounds bilaterally. No interdigital macerations bilaterally. Toenails 1-5 b/l elongated, discolored, dystrophic, thickened, crumbly with subungual debris and tenderness to dorsal palpation. Hyperkeratotic lesion(s) submet head 1 left foot, submet head 1 right foot, submet head 5 left foot and submet head 5 right foot.  No erythema, no edema, no drainage, no flocculence.  Orthopedic: Normal muscle strength 5/5 to all lower extremity muscle groups bilaterally. 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   Radiographs: None Assessment:   1. Pain due to onychomycosis of toenails of both feet   2. Callus   3. Brachymetatarsia   4. Diabetic peripheral neuropathy associated with type 2 diabetes mellitus ([place])    Plan:  Patient was evaluated and treated and all questions answered.  Onychomycosis with pain -Nails palliatively debridement as below. -Educated on self-care  Procedure: Nail Debridement Rationale: Pain Type of Debridement: manual, sharp debridement. Instrumentation: Nail nipper, rotary burr. Number of Nails: 10  -Examined patient. -No new findings. No new orders. -Continue diabetic foot care principles. -Toenails 1-5 b/l were debrided in length and girth with sterile nail nippers and dremel without iatrogenic bleeding.  -Callus(es) submet head 1 left foot, submet head 1 right foot, submet head 5 left foot and submet head 5 right foot pared utilizing sterile scalpel blade without complication or incident. Total number debrided =4. -Patient to report any pedal injuries to medical professional immediately.  Return in about 3 months (around 11/16/2020) for diabetic toenails, corn(s)/callus(es).  Marzetta Board, DPM

## 2020-08-21 ENCOUNTER — Telehealth: Payer: Self-pay | Admitting: *Deleted

## 2020-08-21 NOTE — Telephone Encounter (Signed)
Information was sent through CoverMyMeds for PA for [employer].  Awaiting determination.  Sander Nephew, RN 08/21/2020 12:09 AM.

## 2020-08-22 ENCOUNTER — Other Ambulatory Visit: Payer: Self-pay | Admitting: Internal Medicine

## 2020-08-22 DIAGNOSIS — E118 Type 2 diabetes mellitus with unspecified complications: Secondary | ICD-10-CM

## 2020-08-22 MED ORDER — VICTOZA 18 MG/3ML ~~LOC~~ SOPN: PEN_INJECTOR | SUBCUTANEOUS | 11 refills | Status: DC

## 2020-08-23 ENCOUNTER — Other Ambulatory Visit: Payer: Medicare Other

## 2020-08-23 ENCOUNTER — Ambulatory Visit (INDEPENDENT_AMBULATORY_CARE_PROVIDER_SITE_OTHER): Payer: Medicare Other | Admitting: Gastroenterology

## 2020-08-23 ENCOUNTER — Encounter: Payer: Self-pay | Admitting: Gastroenterology

## 2020-08-23 VITALS — BP 130/64 | HR 77 | Ht 59.0 in | Wt 221.0 lb

## 2020-08-23 DIAGNOSIS — R194 Change in bowel habit: Secondary | ICD-10-CM

## 2020-08-23 DIAGNOSIS — R197 Diarrhea, unspecified: Secondary | ICD-10-CM | POA: Diagnosis not present

## 2020-08-23 MED ORDER — LOPERAMIDE HCL 2 MG PO TABS: 2.0000 mg | ORAL_TABLET | Freq: Every morning | ORAL | 0 refills | Status: DC

## 2020-08-23 MED ORDER — COLESTIPOL HCL 1 G PO TABS
1.0000 g | ORAL_TABLET | Freq: Two times a day (BID) | ORAL | 0 refills | Status: DC
Start: 2020-08-23 — End: 2020-08-26

## 2020-08-23 NOTE — Progress Notes (Signed)
HPI :  74 year old female here for follow-up visit for change in bowel habits.  She is accompanied by her daughter.  I have not seen her since June 2020 at which time we performed her colonoscopy.  She actually had a colonoscopy in May which was aborted due to poor prep.  She had constipation at the time.  Repeat colonoscopy in June using a double prep was adequate with a few small polyps removed.  She has here today complaining of diarrhea that is been bothering her significantly.  She states this is been going on "a very long time".  Daughter reports this is going on "her entire life".  I reviewed her chart with her and the fact that she has had constipation over the past year in 2020 at least, so she thinks perhaps her stools have changed over the past several months at least.  She has urgency and increased frequency with her stools.  She often has to have a bowel movement after she eats or drinks anything.  She has multiple bowel movements per day, hard to say exactly how many are to quantify.  She has had a rare episode of incontinence due to urgency.  She denies any fevers.  No abdominal pains other than some occasional cramping in her lower abdomen when she has a bowel movement.  She is not really tried to take anything for this, has not tried Imodium yet.  On review of medication she has been taking Victoza, she thinks for at least the past year or so.  Denies any other new changes in her medications.  She has had lab work in recent months showing a normal TSH, normal CBC.  She denies any NSAIDs.  No blood in her stools that bother her.  She does not appear to have a gallbladder based on imaging although patient and daughter states she is unaware of this and did not think that she ever had her gallbladder removed at least they cannot recall when it happened.  Denies any sick contacts.  Colonoscopy 04/11/19 - double prep - The perianal and digital rectal examinations were normal. - A diminutive  polyp was found in the cecum. The polyp was sessile. The polyp was removed with a cold biopsy forceps. Resection and retrieval were complete. - Two flat polyps were found in the ascending colon. The polyps were 4 to 5 mm in size. These polyps were removed with a cold snare. Resection and retrieval were complete. - Scattered medium-mouthed diverticula were found in the transverse colon and left colon. - Internal hemorrhoids were found during retroflexion. - The exam was otherwise without abnormality.  Diagnosis Surgical [P], ascending and cecum, polyps (3) - TUBULAR ADENOMA WITHOUT HIGH-GRADE DYSPLASIA OR MALIGNANCY - SESSILE SERRATED POLYP(S) WITHOUT CYTOLOGIC DYSPLASIA    Past Medical History:  Diagnosis Date  . 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 Anemia of chronic disease   . Arthritis   . 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 Katy Fitch annual f/u, most recent 09/2019  . CAD, non-obstructive 08/26/2006   Cath 07/05:multiple areas of nonobstructive disease = medical & RF mgmt, well preserved global systolic function. Dr. Lia Foyer. Nuclear med stress test 01/2014 : Normal stress nuclear study. LV Ejection Fraction: 76%. LV Wall Motion: NL LV Function; NL Wall Motion.      . Chronic neck and back pain 01/03/2013   Multifactorial. Non opioid requiring.   L2-5 fusion, with hardware at L2-3, stable per MRI 2010. Followed by neurosurgery (  Dr. 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    . CKD stage 2 due to type 2 diabetes mellitus ([place]) 12/11/2016  . DEPRESSION 08/26/2006   On paxil    . Diabetic peripheral neuropathy associated with type 2 diabetes mellitus ([place])   . Diabetic retinopathy associated with controlled type 2 diabetes mellitus ([place]) 09/2019   with  macular edema OS - Dr. Katy Fitch  . DYSLIPIDEMIA 11/01/2006  . Gallstones   . GERD 08/26/2006   Heartburn QHS.     [name] Hx of blood transfusion  without reported diagnosis   . hx of esophageal stenosis s/p dilatation   . Hx of small intestinal bacterial overgrowth 12/24/2015   Presumed / clinical dx 2016. Complete resolution with metronidazole (insuance would not cover rifaximin) Flare 2017. Did not respond to ABX. Referral to GI B12 and ferritin low supporting malabsorption.   [name] HYPERTENSION 08/26/2006  . Hypertensive heart disease with chronic diastolic congestive heart failure ([place]) 05/01/2020  . Morbid obesity with BMI of 50.0-59.9, adult ([place])   . Personal history of colonic polyps 2020   Adenomatous; f/u 2023 [facility]  . PVD (peripheral vascular disease) ([place]), resolved per f/u ABIs 2016 03/27/2014   2015 LE arterial Duplex - Possible mild inflow disease on the left. 0-49% bilateral SFA disease, without focal stenosis. Three vesel run-off, bilaterally. Medical tx.   . S/P bilateral cataract extraction   . Type 2 diabetes mellitus with complications ([place]) 95/28/4132     Past Surgical History:  Procedure Laterality Date  . CATARACT EXTRACTION, BILATERAL    . CHOLECYSTECTOMY     pt denies 04/07/16  . COLONOSCOPY    . endometrial biospy    . ESOPHAGEAL DILATION    . LEFT HEART CATH    . lumbar fusion surgery  10/08   L3-L5  . POLYPECTOMY    . posterior lumbar interfusion surgery  07/18/07   L2-L3  . rotator cuff surgery Bilateral    Family History  Problem Relation Age of Onset  . Cirrhosis Mother   . Diabetes Father   . Hypertension Father   . Hypertension Sister   . Diabetes Sister   . Hyperlipidemia Sister   . Colon cancer Neg Hx   . Throat cancer Neg Hx   . Esophageal cancer Neg Hx   . Rectal cancer Neg Hx   . Stomach cancer Neg Hx   . Colon polyps Neg Hx    Social History   Tobacco Use  . Smoking status: Passive Smoke Exposure - Never Smoker  . Smokeless tobacco: Never Used  Vaping Use  . Vaping Use: Never used  Substance Use Topics  . Alcohol use: No    Alcohol/week: 0.0 standard drinks  . Drug  use: No   Current Outpatient Medications  Medication Sig Dispense Refill  . Accu-Chek FastClix Lancets MISC USE THREE TIMES DAILY. 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 amLODipine (NORVASC) 5 MG tablet TAKE 1 TABLET(5 MG) BY MOUTH DAILY 90 tablet 3  . aspirin 81 MG tablet Take 1 tablet (81 mg total) by mouth daily. 90 tablet 3  . atenolol (TENORMIN) 100 MG tablet TAKE 1 TABLET(100 MG) BY MOUTH DAILY 90 tablet 3  . benazepril (LOTENSIN) 40 MG tablet TAKE 1 TABLET(40 MG) BY MOUTH DAILY 90 tablet 3  . Blood Glucose Monitoring Suppl (ACCU-CHEK GUIDE) w/Device KIT 1 each by Does not apply route 3 (three) times daily.  1 kit 0  . canagliflozin (INVOKANA) 100 MG TABS tablet Take 1 tablet (100 mg total) by mouth daily before breakfast. 30 tablet 5  . famotidine (PEPCID) 20 MG tablet Take 1 tablet (20 mg total) by mouth 2 (two) times daily as needed for heartburn or indigestion. 45 tablet 6  . furosemide (LASIX) 40 MG tablet Take 1 tablet (40 mg total) by mouth daily. 90 tablet 3  . glucose blood (ACCU-CHEK GUIDE) test strip Use to test blood glucose 3 times daily. Dx E11.65 100 strip 5  . Insulin Pen Needle (BD PEN NEEDLE NANO U/F) 32G X 4 MM MISC USE TO INJECT VICTOZA ONCE A DAY AND LANTUS ONCE A DAY AS DIRECTED 100 each 5  . isosorbide mononitrate (IMDUR) 30 MG 24 hr tablet TAKE 1 TABLET BY MOUTH ONCE DAILY 90 tablet 1  . liraglutide (VICTOZA) 18 MG/3ML SOPN ADMINISTER 1.8 MG UNDER THE SKIN DAILY 9 mL 11  . loratadine (CLARITIN) 10 MG tablet TAKE 1 TABLET(10 MG) BY MOUTH DAILY 90 tablet 3  . nitroGLYCERIN (NITROSTAT) 0.4 MG SL tablet PLACE 1 TABLET UNDER THE TONGUE EVERY 5 MINUTES AS NEEDED FOR CHEST PAIN 25 tablet 0  . PARoxetine (PAXIL) 40 MG tablet Take 1 tablet (40 mg total) by mouth every morning. 90 tablet 3  . pregabalin (LYRICA) 150 MG capsule TAKE 1 CAPSULE(150 MG) BY MOUTH DAILY 90 capsule 3  . rosuvastatin (CRESTOR) 20 MG tablet Take 1 tablet (20 mg total) by mouth daily. 90  tablet 3  . traMADol (ULTRAM) 50 MG tablet Take 1 tablet (50 mg total) by mouth 2 (two) times daily as needed for severe pain. 90 tablet 0  . Vitamin D, Ergocalciferol, (DRISDOL) 1.25 MG (50000 UNIT) CAPS capsule Take 1 capsule (50,000 Units total) by mouth every 7 (seven) days for 28 days, THEN 1 capsule (50,000 Units total) every 30 (thirty) days. 10 capsule 0   No current facility-administered medications for this visit.   No Known Allergies   Review of Systems: All systems reviewed and negative except where noted in HPI.   Lab Results  Component Value Date   WBC 4.4 08/08/2020   HGB 11.9 08/08/2020   HCT 35.3 08/08/2020   MCV 89 08/08/2020   PLT 305 08/08/2020    Lab Results  Component Value Date   CREATININE 0.83 05/02/2020   BUN 24 05/02/2020   NA 141 05/02/2020   K 4.7 05/02/2020   CL 103 05/02/2020   CO2 24 05/02/2020    Lab Results  Component Value Date   ALT 10 12/06/2018   AST 13 12/06/2018   ALKPHOS 91 12/06/2018   BILITOT 0.5 12/06/2018     Physical Exam: BP 130/64   Pulse 77   Ht '4\' 11"'  (1.499 m)   Wt 221 lb (100.2 kg)   BMI 44.64 kg/m  Constitutional: Pleasant,well-developed, female in no acute distress. Abdominal: Soft, nondistended, nontender.  There are no masses palpable.  Extremities: no edema Lymphadenopathy: No cervical adenopathy noted. Neurological: Alert and oriented to person place and time. Skin: Skin is warm and dry. No rashes noted. Psychiatric: Normal mood and affect. Behavior is normal.   ASSESSMENT AND PLAN: 74 year old female here for reassessment of the following:  Change in bowel habits / diarrhea - patient historically has had constipation per review of her chart and my prior interactions with her, presenting with at least several months of diarrhea, she thinks more than a year at least, frequent loose stools and postprandial  urgency.  I discussed differential diagnosis with the patient and her family.  Victoza can cause  diarrhea, she thinks has been on this for at least over a year, unclear if there is a relation and time course on this or not.  Discussed options with her.  Reassured her that her last colonoscopy looked okay and was relatively recent.  I will send her for stool test for lactoferrin to ensure no inflammatory component.  Infectious etiology seems unlikely given chronicity of her symptoms up to this point from what she reports to me today.  She does not appear to have a gallbladder, given her postprandial symptoms, will start Colestid 1 to 2 g twice daily to see if that helps.  I also recommend she take an Imodium every a.m. and titrate up as needed.  We will check on her once we get the results of her stool test and otherwise asked her to contact me in a few weeks for an assessment of how she is doing.  Hopefully she improves with these measures and we do not need to proceed with additional evaluation but we can consider that based on her course and how she is doing.  She agreed with the plan, all questions answered.  Of note, I specifically told her to discuss the Colestid with her pharmacist in regards to when she can take this in relation to her other medications.  She will also need to have her lipid panel checked periodically given mild elevation in triglycerides in the past.  Marshallberg Cellar, MD [facility]

## 2020-08-23 NOTE — Patient Instructions (Addendum)
If you are age 75 or older, your body mass index should be between 23-30. Your Body mass index is 44.64 kg/m. If this is out of the aforementioned range listed, please consider follow up with your Primary Care Provider.  If you are age 23 or younger, your body mass index should be between 19-25. Your Body mass index is 44.64 kg/m. If this is out of the aformentioned range listed, please consider follow up with your Primary Care Provider.   Please go to the lab in the basement of our building to have lab work done as you leave today. Hit "B" for basement when you get on the elevator.  When the doors open the lab is on your left.  We will call you with the results. Thank you.  Due to recent changes in healthcare laws, you may see the results of your imaging and laboratory studies on MyChart before your provider has had a chance to review them.  We understand that in some cases there may be results that are confusing or concerning to you. Not all laboratory results come back in the same time frame and the provider may be waiting for multiple results in order to interpret others.  Please give [place] 48 hours in order for your provider to thoroughly review all the results before contacting the office for clarification of your results.   Please call in a few weeks and give [place] an update on how you are doing.   Thank you for entrusting me with your care and for choosing [facility], Dr. Shoshoni Cellar

## 2020-08-26 ENCOUNTER — Other Ambulatory Visit: Payer: Self-pay

## 2020-08-26 ENCOUNTER — Telehealth: Payer: Self-pay | Admitting: *Deleted

## 2020-08-26 MED ORDER — COLESTIPOL HCL 1 G PO TABS
1.0000 g | ORAL_TABLET | Freq: Two times a day (BID) | ORAL | 1 refills | Status: DC
Start: 2020-08-26 — End: 2020-12-12

## 2020-08-26 NOTE — Telephone Encounter (Addendum)
Information was resubmitted to [employer] for PA for Invok[employer].  Awaiting decision.   Patient may need to try formulary Faxiga or Jardiance. [place] [phone].   Sander Nephew, RN 08/26/2020 10:06 AM Fax from [employer] ,[place] for Anastasio Auerbach was denied patient will need to try and fail [place].  Message to be sent to Dr. Jimmye Norman to consider a change. Sander Nephew, RN 08/27/2020 10:54 AM.

## 2020-08-27 NOTE — Telephone Encounter (Signed)
I'll have to give this some thought - I'm on inpatient service right now so give me a little time and I'll get back to you.  THanks Regino Schultze!

## 2020-08-28 ENCOUNTER — Telehealth: Payer: Self-pay

## 2020-08-28 ENCOUNTER — Other Ambulatory Visit: Payer: Medicare Other

## 2020-08-28 DIAGNOSIS — R194 Change in bowel habit: Secondary | ICD-10-CM

## 2020-08-28 DIAGNOSIS — R197 Diarrhea, unspecified: Secondary | ICD-10-CM

## 2020-08-28 NOTE — Telephone Encounter (Signed)
Good advice, I agree with you, thank you for reassuring her.

## 2020-08-28 NOTE — Telephone Encounter (Signed)
Returned call to patient's daughter. States patient has appt today for Covid booster at [facility] and she is concerned because patient recently received Flu and TDaP vaccines. These vaccines were given on 08/08/2020. Explained there should not be any concern with flu, TDap, and Covid vaccines. For reassurance she is advised to let the Pharmacist at [facility] know prior to [employer]. Hubbard Hartshorn, BSN, RN-BC

## 2020-08-28 NOTE — Telephone Encounter (Signed)
Pt's daughter requesting to speak with a nurse about Covid booster shot. Please call back.

## 2020-08-30 ENCOUNTER — Encounter: Payer: Self-pay | Admitting: Internal Medicine

## 2020-08-30 ENCOUNTER — Other Ambulatory Visit: Payer: Self-pay

## 2020-08-30 ENCOUNTER — Ambulatory Visit (INDEPENDENT_AMBULATORY_CARE_PROVIDER_SITE_OTHER): Payer: Medicare Other | Admitting: Internal Medicine

## 2020-08-30 DIAGNOSIS — E118 Type 2 diabetes mellitus with unspecified complications: Secondary | ICD-10-CM | POA: Diagnosis not present

## 2020-08-30 DIAGNOSIS — I10 Essential (primary) hypertension: Secondary | ICD-10-CM | POA: Diagnosis not present

## 2020-08-30 LAB — GLUCOSE, CAPILLARY: Glucose-Capillary: 217 mg/dL — ABNORMAL HIGH (ref 70–99)

## 2020-08-30 MED ORDER — METFORMIN HCL ER (MOD) 1000 MG PO TB24: 1000.0000 mg | ORAL_TABLET | Freq: Every day | ORAL | 1 refills | Status: DC

## 2020-08-30 NOTE — Patient Instructions (Addendum)
Thank you for allowing [place] to provide your care today.  We discussed your elevated blood sugar at home. I restart Metformin for you. Please take 1 tablet once a day for a week then 1 tablet in the morning and 1 tablet in the evening.   Please follow-up in 2-4 weeks for follow up of diabetes. Please bring your meter with you.[name]    Should you have any questions or concerns please call the internal medicine clinic at [phone].

## 2020-08-30 NOTE — Assessment & Plan Note (Signed)
BP today 111/52. Well controlled on current medications. -Will continue current regimen.

## 2020-08-30 NOTE — Progress Notes (Signed)
Established Patient Office Visit  Subjective:  Patient ID: [name] [name], female    DOB: 1945-12-07  Age: 74 y.o. MRN: [id number]  CC:  Chief Complaint  Patient presents with  . Diabetes    pt states that her blood sugar was running high for two weeks now ...pt states that she the last visit she was taken off her medication due to diarrhea  but since then her blood sugar has been high     HPI [name] presents to discuss her blood sugar number. Please refer to problem based charting for further details and assessment and plan.  Past Medical History:  Diagnosis Date  . 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 Anemia of chronic disease   . Arthritis   . 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 Katy Fitch annual f/u, most recent 09/2019  . CAD, non-obstructive 08/26/2006   Cath 07/05:multiple areas of nonobstructive disease = medical & RF mgmt, well preserved global systolic function. Dr. Lia Foyer. Nuclear med stress test 01/2014 : Normal stress nuclear study. LV Ejection Fraction: 76%. LV Wall Motion: NL LV Function; NL Wall Motion.      . Chronic neck and back pain 01/03/2013   Multifactorial. Non opioid requiring.   L2-5 fusion, with hardware at L2-3, stable per MRI 2010. Followed by neurosurgery (Dr. 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    . CKD stage 2 due to type 2 diabetes mellitus ([place]) 12/11/2016  . DEPRESSION 08/26/2006   On paxil    . Diabetic peripheral neuropathy associated with type 2 diabetes mellitus ([place])   . Diabetic retinopathy associated with controlled type 2 diabetes mellitus ([place]) 09/2019   with  macular edema OS - Dr. Katy Fitch  . DYSLIPIDEMIA 11/01/2006  . Gallstones   . GERD 08/26/2006   Heartburn QHS.     [name] Hx of blood transfusion without reported diagnosis   . hx of esophageal stenosis s/p dilatation   . Hx of small intestinal bacterial overgrowth 12/24/2015    Presumed / clinical dx 2016. Complete resolution with metronidazole (insuance would not cover rifaximin) Flare 2017. Did not respond to ABX. Referral to GI B12 and ferritin low supporting malabsorption.   [name] HYPERTENSION 08/26/2006  . Hypertensive heart disease with chronic diastolic congestive heart failure ([place]) 05/01/2020  . Morbid obesity with BMI of 50.0-59.9, adult ([place])   . Personal history of colonic polyps 2020   Adenomatous; f/u 2023 [facility]  . PVD (peripheral vascular disease) ([place]), resolved per f/u ABIs 2016 03/27/2014   2015 LE arterial Duplex - Possible mild inflow disease on the left. 0-49% bilateral SFA disease, without focal stenosis. Three vesel run-off, bilaterally. Medical tx.   . S/P bilateral cataract extraction   . Type 2 diabetes mellitus with complications ([place]) 07/37/1062    Past Surgical History:  Procedure Laterality Date  . CATARACT EXTRACTION, BILATERAL    . CHOLECYSTECTOMY     pt denies 04/07/16  . COLONOSCOPY    . endometrial biospy    . ESOPHAGEAL DILATION    . LEFT HEART CATH    . lumbar fusion surgery  10/08   L3-L5  . POLYPECTOMY    . posterior lumbar interfusion surgery  07/18/07   L2-L3  . rotator cuff surgery Bilateral     Family History  Problem Relation Age of Onset  . Cirrhosis Mother   . Diabetes Father   . Hypertension Father   .  Hypertension Sister   . Diabetes Sister   . Hyperlipidemia Sister   . Colon cancer Neg Hx   . Throat cancer Neg Hx   . Esophageal cancer Neg Hx   . Rectal cancer Neg Hx   . Stomach cancer Neg Hx   . Colon polyps Neg Hx     Social History   Socioeconomic History  . Marital status: Single    Spouse name: Not on file  . Number of children: 2  . Years of education: Not on file  . Highest education level: Not on file  Occupational History  . Not on file  Tobacco Use  . Smoking status: Passive Smoke Exposure - Never Smoker  . Smokeless tobacco: Never Used  Vaping Use  . Vaping Use: Never used    Substance and Sexual Activity  . Alcohol use: No    Alcohol/week: 0.0 standard drinks  . Drug use: No  . Sexual activity: Not on file  Other Topics Concern  . 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 Has two daughters and several grand kids. Severe limitation in ambulation. Occ goes out to church but usually daughter gets groceries.    She is one of 7 kids and the oldest so helped raise the other 6.   Social Determinants of Health   Financial Resource Strain:   . Difficulty of Paying Living Expenses: Not on file  Food Insecurity:   . Worried About Charity fundraiser in the Last Year: Not on file  . Ran Out of Food in the Last Year: Not on file  Transportation Needs:   . Lack of Transportation (Medical): Not on file  . Lack of Transportation (Non-Medical): Not on file  Physical Activity:   . Days of Exercise per Week: Not on file  . Minutes of Exercise per Session: Not on file  Stress:   . Feeling of Stress : Not on file  Social Connections:   . Frequency of Communication with Friends and Family: Not on file  . Frequency of Social Gatherings with Friends and Family: Not on file  . Attends Religious Services: Not on file  . Active Member of Clubs or Organizations: Not on file  . Attends Archivist Meetings: Not on file  . Marital Status: Not on file  Intimate Partner Violence:   . Fear of Current or Ex-Partner: Not on file  . Emotionally Abused: Not on file  . Physically Abused: Not on file  . Sexually Abused: Not on file    Outpatient Medications Prior to Visit  Medication Sig Dispense Refill  . Accu-Chek FastClix Lancets MISC USE THREE TIMES DAILY. 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 amLODipine (NORVASC) 5 MG tablet TAKE 1 TABLET(5 MG) BY MOUTH DAILY 90 tablet 3  . aspirin 81 MG tablet Take 1 tablet (81 mg total) by mouth daily. 90 tablet 3  . atenolol (TENORMIN) 100 MG tablet TAKE 1 TABLET(100 MG) BY  MOUTH DAILY 90 tablet 3  . benazepril (LOTENSIN) 40 MG tablet TAKE 1 TABLET(40 MG) BY MOUTH DAILY 90 tablet 3  . Blood Glucose Monitoring Suppl (ACCU-CHEK GUIDE) w/Device KIT 1 each by Does not apply route 3 (three) times daily. 1 kit 0  . canagliflozin (INVOKANA) 100 MG TABS tablet Take 1 tablet (100 mg total) by mouth daily before breakfast. 30 tablet 5  . colestipol (COLESTID) 1 g tablet Take 1-2 tablets (1-2 g  total) by mouth in the morning and at bedtime. 180 tablet 1  . famotidine (PEPCID) 20 MG tablet Take 1 tablet (20 mg total) by mouth 2 (two) times daily as needed for heartburn or indigestion. 45 tablet 6  . furosemide (LASIX) 40 MG tablet Take 1 tablet (40 mg total) by mouth daily. 90 tablet 3  . glucose blood (ACCU-CHEK GUIDE) test strip Use to test blood glucose 3 times daily. Dx E11.65 100 strip 5  . Insulin Pen Needle (BD PEN NEEDLE NANO U/F) 32G X 4 MM MISC USE TO INJECT VICTOZA ONCE A DAY AND LANTUS ONCE A DAY AS DIRECTED 100 each 5  . isosorbide mononitrate (IMDUR) 30 MG 24 hr tablet TAKE 1 TABLET BY MOUTH ONCE DAILY 90 tablet 1  . liraglutide (VICTOZA) 18 MG/3ML SOPN ADMINISTER 1.8 MG UNDER THE SKIN DAILY 9 mL 11  . loperamide (IMODIUM A-D) 2 MG tablet Take 1 tablet (2 mg total) by mouth in the morning. Adjust dosing as needed 30 tablet 0  . loratadine (CLARITIN) 10 MG tablet TAKE 1 TABLET(10 MG) BY MOUTH DAILY 90 tablet 3  . nitroGLYCERIN (NITROSTAT) 0.4 MG SL tablet PLACE 1 TABLET UNDER THE TONGUE EVERY 5 MINUTES AS NEEDED FOR CHEST PAIN 25 tablet 0  . PARoxetine (PAXIL) 40 MG tablet Take 1 tablet (40 mg total) by mouth every morning. 90 tablet 3  . pregabalin (LYRICA) 150 MG capsule TAKE 1 CAPSULE(150 MG) BY MOUTH DAILY 90 capsule 3  . rosuvastatin (CRESTOR) 20 MG tablet Take 1 tablet (20 mg total) by mouth daily. 90 tablet 3  . traMADol (ULTRAM) 50 MG tablet Take 1 tablet (50 mg total) by mouth 2 (two) times daily as needed for severe pain. 90 tablet 0  . Vitamin D,  Ergocalciferol, (DRISDOL) 1.25 MG (50000 UNIT) CAPS capsule Take 1 capsule (50,000 Units total) by mouth every 7 (seven) days for 28 days, THEN 1 capsule (50,000 Units total) every 30 (thirty) days. 10 capsule 0   No facility-administered medications prior to visit.    No Known Allergies  ROS Review of Systems    Constitutional: Negative for chills and fever.  Respiratory: Negative for shortness of breath.   Cardiovascular: Negative for chest pain and leg swelling.  Gastrointestinal: Negative for abdominal pain, nausea and vomiting. Diarrhea improved Neurological: Negative for dizziness and headaches.   Objective:    Physical Exam Constitutional:      General: She is not in acute distress.    Appearance: Normal appearance. She is not ill-appearing.  Cardiovascular:     Rate and Rhythm: Normal rate and regular rhythm.     Pulses: Normal pulses.     Heart sounds: Normal heart sounds. No murmur heard.   Abdominal:     Palpations: Abdomen is soft.     Tenderness: There is no abdominal tenderness.  Musculoskeletal:     Right lower leg: No edema.     Left lower leg: No edema.  Skin:    General: Skin is warm and dry.  Neurological:     General: No focal deficit present.     Mental Status: She is alert and oriented to person, place, and time.  Psychiatric:        Mood and Affect: Mood normal.        Behavior: Behavior normal.        Judgment: Judgment normal.     BP (!) 111/52 (BP Location: Right Arm, Patient Position: Sitting, Cuff Size: Large)   Pulse  84   Temp 98.2 F (36.8 C) (Oral)   Ht _0  (1.499 m)   Wt 221 lb 12.8 oz (100.6 kg)   SpO2 97%   BMI 44.80 kg/m  Wt Readings from Last 3 Encounters:  08/30/20 221 lb 12.8 oz (100.6 kg)  08/23/20 221 lb (100.2 kg)  08/08/20 224 lb 8 oz (101.8 kg)    Health Maintenance Due  Topic Date Due  . FOOT EXAM  08/22/2020    There are no preventive care reminders to display for this patient.  Lab Results  Component  Value Date   TSH 3.530 05/02/2020   Lab Results  Component Value Date   WBC 4.4 08/08/2020   HGB 11.9 08/08/2020   HCT 35.3 08/08/2020   MCV 89 08/08/2020   PLT 305 08/08/2020   Lab Results  Component Value Date   NA 141 05/02/2020   K 4.7 05/02/2020   CO2 24 05/02/2020   GLUCOSE 121 (H) 05/02/2020   BUN 24 05/02/2020   CREATININE 0.83 05/02/2020   BILITOT 0.5 12/06/2018   ALKPHOS 91 12/06/2018   AST 13 12/06/2018   ALT 10 12/06/2018   PROT 7.2 12/06/2018   ALBUMIN 4.3 08/15/2019   CALCIUM 9.1 05/02/2020   ANIONGAP 12 10/26/2018   GFR 47.14 (L) 06/12/2014   Lab Results  Component Value Date   CHOL 197 08/08/2020   Lab Results  Component Value Date   HDL 56 08/08/2020   Lab Results  Component Value Date   LDLCALC 112 (H) 08/08/2020   Lab Results  Component Value Date   TRIG 166 (H) 08/08/2020   Lab Results  Component Value Date   CHOLHDL 3.5 08/08/2020   Lab Results  Component Value Date   HGBA1C 7.3 (A) 08/08/2020      Assessment & Plan:   Problem List Items Addressed This Visit      Cardiovascular and Mediastinum   HTN (hypertension) (Chronic)    BP today 111/52. Well controlled on current medications. -Will continue current regimen.         Endocrine   Type 2 diabetes mellitus with complications ([place]) (Chronic)    Patient presented with complaint of hyperglycemia at home. She did not bring her meter with her but mentions that her blood sugar in past 2 weeks went up to ~300 multiple times. She was asymptomatic. Denies any symptoms concerning for DKA.  She had ongoing diarrhea and Metformin stopped last visit. She states that she had diarrhea despite stopping Metformin. Although it was then improved after GI started her on Imodium.  After a shared decision making and as her diarrhea has improved (she was also evaluated by GI), will resume her Metformin.  -Will restart Metfromin 1000 mg QD x 1 week then 1000 mg BID to help with controlling her  hyperglycemia.  -Will continue Victoza and Invokana. -F/u in clinic in 2 weeks          Relevant Medications   metFORMIN (GLUMETZA) 1000 MG (MOD) 24 hr tablet      Meds ordered this encounter  Medications  . metFORMIN (GLUMETZA) 1000 MG (MOD) 24 hr tablet    Sig: Take 1 tablet (1,000 mg total) by mouth daily with breakfast. Take 1 tablet in the morning for 1 week then take 1 tablet in the morning and 1 tablet in the afternoon    Dispense:  90 tablet    Refill:  1    Did not tolerate short acting formulation (diarrhea)  Follow-up: Return in about 2 weeks (around 09/13/2020) for DM f/u.    Dewayne Hatch, MD

## 2020-08-30 NOTE — Assessment & Plan Note (Signed)
Patient presented with complaint of hyperglycemia at home. She did not bring her meter with her but mentions that her blood sugar in past 2 weeks went up to ~300 multiple times. She was asymptomatic. Denies any symptoms concerning for DKA.  She had ongoing diarrhea and Metformin stopped last visit. She states that she had diarrhea despite stopping Metformin. Although it was then improved after GI started her on Imodium.  After a shared decision making and as her diarrhea has improved (she was also evaluated by GI), will resume her Metformin.  -Will restart Metfromin 1000 mg QD x 1 week then 1000 mg BID to help with controlling her hyperglycemia.  -Will continue Victoza and Invokana. -F/u in clinic in 2 weeks

## 2020-08-31 LAB — CALPROTECTIN, FECAL: Calprotectin, Fecal: 75 ug/g (ref 0–120)

## 2020-09-03 NOTE — Progress Notes (Signed)
Internal Medicine Clinic Attending  Case discussed with Dr. Masoudi  At the time of the visit.  We reviewed the resident's history and exam and pertinent patient test results.  I agree with the assessment, diagnosis, and plan of care documented in the resident's note.  

## 2020-09-11 NOTE — Progress Notes (Signed)
Established Patient Office Visit  Subjective:  Patient ID: Aubrionna Istre, female    DOB: 1946/04/24  Age: 74 y.o. MRN: [id number]  CC: No chief complaint on file.   HPI  Ms. Hashimi had been experiencing diarrhea, for which her Metformin had been decreased and finally discontinued in an effort to see if symptoms were would improve.  Unfortunately not only did the diarrhea resolved, but her blood sugars have become uncontrolled and she presented to clinic on 08/30/2020 for further direction.  She has since seen gastroenterology for diarrhea and was given as needed Imodium which has improved her diarrhea considerably.  The Metformin was appropriately resumed at 1000 mg once per day then twice per day, and Victoza was continued.  Invokana was prescribed, though insurance has not approved it, and she will need to try [place] or [place].  She is concerned because her blood sugars have been running high.   Saw GI Dr. Havery Moros last month to discuss diarrhea.  Because Metformin had been discontinued, he did not see that medication as a possible cause of the diarrhea, and questioned whether Victoza could be causing her symptoms.  Former imaging indicated absence of gallbladder, though patient and her daughter do not recall a cholecystectomy.  Nevertheless, she was started on Colestid 1 to 2 g twice daily to see if that helps.  She was also recommended to take an Imodium every a.m. and titrate up as needed.  She is beyond pleased to report that her diarrhea has completely resolved with 1 g of Colestid each morning, negating the need for as needed Imodium.  Fax from [employer] [facility] for Anastasio Auerbach was denied patient will need to try and fail [place].   11/2019 - recheck vit D, A1c, BMP  She is following up today to discuss the above.  Patient Active Problem List   Diagnosis Date Noted  . 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 Morbid obesity with  BMI of 50.0-59.9, adult ([place])   . Anemia of chronic disease   . Glenohumeral arthritis 01/30/2020  . Arthritis of neck 01/30/2020  . Personal history of colonic polyps 2020  . Esophageal dysphagia 03/03/2018  . Gait instability 03/03/2018  . Aortic atherosclerosis ([place]) 06/17/2017  . Right carotid bruit 04/08/2017  . CKD stage 2 due to type 2 diabetes mellitus ([place]) 12/11/2016  . Diabetic polyneuropathy associated with type 2 diabetes mellitus ([place]) 12/10/2016  . Goals of care, counseling/discussion 01/18/2015  . ASCUS with positive high risk HPV 08/03/2014  . Hypertensive heart disease with chronic diastolic congestive heart failure ([place]) 12/21/2013  . Healthcare maintenance 04/25/2013  . Chronic neck and back pain 01/03/2013  . Insomnia 12/22/2011  . Vitamin B12 deficiency due to intestinal malabsorption 12/27/2009  . Vitamin D deficiency 12/27/2009  . Osteopenia 08/01/2008  . Hyperlipidemia 11/01/2006  . Depression 08/26/2006  . HTN (hypertension) 08/26/2006  . GERD 08/26/2006  . Diabetic retinopathy ([place]) 08/26/2006  . Type 2 diabetes mellitus with complications ([place]) 52/77/8242    Past Surgical History:  Procedure Laterality Date  . CATARACT EXTRACTION, BILATERAL    . CHOLECYSTECTOMY     pt denies 04/07/16  . COLONOSCOPY    . endometrial biospy    . ESOPHAGEAL DILATION    . LEFT HEART CATH    . lumbar fusion surgery  10/08   L3-L5  . POLYPECTOMY    . posterior lumbar interfusion surgery  07/18/07   L2-L3  .  rotator cuff surgery Bilateral     Outpatient Medications Prior to Visit  Medication Sig Dispense Refill  . Accu-Chek FastClix Lancets MISC USE THREE TIMES DAILY. 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 amLODipine (NORVASC) 5 MG tablet TAKE 1 TABLET(5 MG) BY MOUTH DAILY 90 tablet 3  . aspirin 81 MG tablet Take 1 tablet (81 mg total) by mouth daily. 90 tablet 3  . atenolol (TENORMIN) 100 MG tablet TAKE 1 TABLET(100 MG) BY MOUTH DAILY 90 tablet 3  .  benazepril (LOTENSIN) 40 MG tablet TAKE 1 TABLET(40 MG) BY MOUTH DAILY 90 tablet 3  . Blood Glucose Monitoring Suppl (ACCU-CHEK GUIDE) w/Device KIT 1 each by Does not apply route 3 (three) times daily. 1 kit 0  . canagliflozin (INVOKANA) 100 MG TABS tablet Take 1 tablet (100 mg total) by mouth daily before breakfast. 30 tablet 5  . colestipol (COLESTID) 1 g tablet Take 1-2 tablets (1-2 g total) by mouth in the morning and at bedtime. 180 tablet 1  . famotidine (PEPCID) 20 MG tablet Take 1 tablet (20 mg total) by mouth 2 (two) times daily as needed for heartburn or indigestion. 45 tablet 6  . furosemide (LASIX) 40 MG tablet Take 1 tablet (40 mg total) by mouth daily. 90 tablet 3  . glucose blood (ACCU-CHEK GUIDE) test strip Use to test blood glucose 3 times daily. Dx E11.65 100 strip 5  . Insulin Pen Needle (BD PEN NEEDLE NANO U/F) 32G X 4 MM MISC USE TO INJECT VICTOZA ONCE A DAY AND LANTUS ONCE A DAY AS DIRECTED 100 each 5  . isosorbide mononitrate (IMDUR) 30 MG 24 hr tablet TAKE 1 TABLET BY MOUTH ONCE DAILY 90 tablet 1  . liraglutide (VICTOZA) 18 MG/3ML SOPN ADMINISTER 1.8 MG UNDER THE SKIN DAILY 9 mL 11  . loperamide (IMODIUM A-D) 2 MG tablet Take 1 tablet (2 mg total) by mouth in the morning. Adjust dosing as needed 30 tablet 0  . loratadine (CLARITIN) 10 MG tablet TAKE 1 TABLET(10 MG) BY MOUTH DAILY 90 tablet 3  . metFORMIN (GLUMETZA) 1000 MG (MOD) 24 hr tablet Take 1 tablet (1,000 mg total) by mouth daily with breakfast. Take 1 tablet in the morning for 1 week then take 1 tablet in the morning and 1 tablet in the afternoon 90 tablet 1  . nitroGLYCERIN (NITROSTAT) 0.4 MG SL tablet PLACE 1 TABLET UNDER THE TONGUE EVERY 5 MINUTES AS NEEDED FOR CHEST PAIN 25 tablet 0  . PARoxetine (PAXIL) 40 MG tablet Take 1 tablet (40 mg total) by mouth every morning. 90 tablet 3  . pregabalin (LYRICA) 150 MG capsule TAKE 1 CAPSULE(150 MG) BY MOUTH DAILY 90 capsule 3  . rosuvastatin (CRESTOR) 20 MG tablet Take 1  tablet (20 mg total) by mouth daily. 90 tablet 3  . traMADol (ULTRAM) 50 MG tablet Take 1 tablet (50 mg total) by mouth 2 (two) times daily as needed for severe pain. 90 tablet 0  . Vitamin D, Ergocalciferol, (DRISDOL) 1.25 MG (50000 UNIT) CAPS capsule Take 1 capsule (50,000 Units total) by mouth every 7 (seven) days for 28 days, THEN 1 capsule (50,000 Units total) every 30 (thirty) days. 10 capsule 0   No facility-administered medications prior to visit.    No Known Allergies  ROS Review of Systems    Objective:    Physical Exam  There were no vitals taken for this visit. Wt Readings from Last 3 Encounters:  08/30/20 221 lb 12.8 oz (  100.6 kg)  08/23/20 221 lb (100.2 kg)  08/08/20 224 lb 8 oz (101.8 kg)     Health Maintenance Due  Topic Date Due  . FOOT EXAM  08/22/2020    There are no preventive care reminders to display for this patient.  Lab Results  Component Value Date   TSH 3.530 05/02/2020   Lab Results  Component Value Date   WBC 4.4 08/08/2020   HGB 11.9 08/08/2020   HCT 35.3 08/08/2020   MCV 89 08/08/2020   PLT 305 08/08/2020   Lab Results  Component Value Date   NA 141 05/02/2020   K 4.7 05/02/2020   CO2 24 05/02/2020   GLUCOSE 121 (H) 05/02/2020   BUN 24 05/02/2020   CREATININE 0.83 05/02/2020   BILITOT 0.5 12/06/2018   ALKPHOS 91 12/06/2018   AST 13 12/06/2018   ALT 10 12/06/2018   PROT 7.2 12/06/2018   ALBUMIN 4.3 08/15/2019   CALCIUM 9.1 05/02/2020   ANIONGAP 12 10/26/2018   GFR 47.14 (L) 06/12/2014   Lab Results  Component Value Date   CHOL 197 08/08/2020   Lab Results  Component Value Date   HDL 56 08/08/2020   Lab Results  Component Value Date   LDLCALC 112 (H) 08/08/2020   Lab Results  Component Value Date   TRIG 166 (H) 08/08/2020   Lab Results  Component Value Date   CHOLHDL 3.5 08/08/2020   Lab Results  Component Value Date   HGBA1C 7.3 (A) 08/08/2020      Assessment & Plan:   See problem based  charting.   Follow-up: February 2022 for A1c, and vitamin D and B12 levels.   Angelica Pou, MD

## 2020-09-12 ENCOUNTER — Other Ambulatory Visit: Payer: Self-pay

## 2020-09-12 ENCOUNTER — Ambulatory Visit (INDEPENDENT_AMBULATORY_CARE_PROVIDER_SITE_OTHER): Payer: Medicare Other | Admitting: Internal Medicine

## 2020-09-12 ENCOUNTER — Encounter: Payer: Self-pay | Admitting: Internal Medicine

## 2020-09-12 DIAGNOSIS — I5032 Chronic diastolic (congestive) heart failure: Secondary | ICD-10-CM

## 2020-09-12 DIAGNOSIS — I10 Essential (primary) hypertension: Secondary | ICD-10-CM

## 2020-09-12 DIAGNOSIS — Z794 Long term (current) use of insulin: Secondary | ICD-10-CM

## 2020-09-12 DIAGNOSIS — M542 Cervicalgia: Secondary | ICD-10-CM

## 2020-09-12 DIAGNOSIS — E782 Mixed hyperlipidemia: Secondary | ICD-10-CM

## 2020-09-12 DIAGNOSIS — I110 Hypertensive heart disease with heart failure: Secondary | ICD-10-CM

## 2020-09-12 DIAGNOSIS — E118 Type 2 diabetes mellitus with unspecified complications: Secondary | ICD-10-CM

## 2020-09-12 DIAGNOSIS — E1140 Type 2 diabetes mellitus with diabetic neuropathy, unspecified: Secondary | ICD-10-CM

## 2020-09-12 DIAGNOSIS — M549 Dorsalgia, unspecified: Secondary | ICD-10-CM

## 2020-09-12 DIAGNOSIS — G8929 Other chronic pain: Secondary | ICD-10-CM

## 2020-09-12 DIAGNOSIS — K219 Gastro-esophageal reflux disease without esophagitis: Secondary | ICD-10-CM

## 2020-09-12 MED ORDER — ROSUVASTATIN CALCIUM 40 MG PO TABS: 40.0000 mg | ORAL_TABLET | Freq: Every day | ORAL | 3 refills | Status: DC

## 2020-09-12 MED ORDER — NITROGLYCERIN 0.4 MG SL SUBL
SUBLINGUAL_TABLET | SUBLINGUAL | 0 refills | Status: DC
Start: 2020-09-12 — End: 2021-03-19

## 2020-09-12 MED ORDER — METFORMIN HCL ER 500 MG PO TB24: 2000.0000 mg | ORAL_TABLET | Freq: Every day | ORAL | 5 refills | Status: DC

## 2020-09-12 NOTE — Patient Instructions (Signed)
Ms. Yzaguirre, It's always good to see you!  We reviewed your medications and updated them to reflect exactly what you should be taking.  I'm so happy that your diarrhea has been fixed with the new medication!  We talked about your diabetes and I reminded you that there is a new prescription for a medication called Invokana, which you will add to your current diabetes regimen.  We will recheck in February.  Your cholesterol pill will be doubled to get better control.  You can take 2 tabs until all gone, then pick up the prescription for the new 40 mg dose.  It's all going to be fine!!!  Take care and stay well (and don't tell me if you eat too many treats! ) :)  Dr. Jimmye Norman

## 2020-09-13 MED ORDER — TRAMADOL HCL 50 MG PO TABS: 50.0000 mg | ORAL_TABLET | Freq: Two times a day (BID) | ORAL | 0 refills | Status: DC | PRN

## 2020-09-13 MED ORDER — EMPAGLIFLOZIN 10 MG PO TABS: 10.0000 mg | ORAL_TABLET | Freq: Every day | ORAL | 3 refills | Status: DC

## 2020-09-13 MED ORDER — ISOSORBIDE MONONITRATE ER 30 MG PO TB24
30.0000 mg | ORAL_TABLET | Freq: Every day | ORAL | 3 refills | Status: DC
Start: 2020-09-13 — End: 2021-03-17

## 2020-09-13 MED ORDER — METFORMIN HCL ER 500 MG PO TB24: 1000.0000 mg | ORAL_TABLET | Freq: Two times a day (BID) | ORAL | 5 refills | Status: DC

## 2020-09-13 MED ORDER — PREGABALIN 150 MG PO CAPS: ORAL_CAPSULE | ORAL | 3 refills | Status: DC

## 2020-09-13 NOTE — Assessment & Plan Note (Signed)
Reasonably well controlled, no change today, goal SBP < 130 most readings.

## 2020-09-13 NOTE — Assessment & Plan Note (Addendum)
Uncontrolled due to stopping various medications due to concern about side effects, or to simplify regimen (I.e. meal time insulin).  Diarrhea found not to be related to Metformin; Metformin has been resumed at 2000 mg daily with no problem.  She had not yet started the [place]; pharmacy denied coverage and suggested [place] or [place] as an alternative.  Prescription for Jardiance sent to her pharmacy.  She has been watching her diet.  Check A1c 11/2020.

## 2020-09-13 NOTE — Assessment & Plan Note (Signed)
Has not taken H2 blocker, asymptomatic.  Will resolve problem and add to history.

## 2020-09-13 NOTE — Assessment & Plan Note (Signed)
Lipids uncontrolled; she had with her a prescription for both 10 mg and 20 mg of rosuvastatin and wasn't sure which one she should be taking.  I instructed her to discard the 10 mg (I will do this for her in the clinic), and to take 2 of the 20 mg until gone, then fill the prescription for rosuvastatin 40 mg.  Recheck in 6 months.

## 2020-09-13 NOTE — Assessment & Plan Note (Signed)
Compensated 

## 2020-09-13 NOTE — Assessment & Plan Note (Signed)
Tramadol prescription had expired, and pain has been more intense despite appropriate use of Tylenol.  We will renew the tramadol as it is beneficial for her mobility.

## 2020-09-24 ENCOUNTER — Ambulatory Visit: Payer: Medicare Other

## 2020-10-09 ENCOUNTER — Other Ambulatory Visit: Payer: Self-pay | Admitting: *Deleted

## 2020-10-10 ENCOUNTER — Ambulatory Visit: Payer: Medicare Other

## 2020-10-14 NOTE — Progress Notes (Deleted)
Cardiology Office Note    Date:  10/14/2020   ID:  Kallen, Basic [birth year], MRN [id number]  PCP:  Angelica Pou, MD  Cardiologist: Ena Dawley, MD EPS: None  No chief complaint on file.   History of Present Illness:  [name] [name] is a 75 y.o. female with history of nonobstructive CAD on cath 2005 medical management, hypertension, HLD, DM, PVD, admission 2018 for acute metabolic encephalopathy with hypobulimic shock and AKI requiring temporary intubation ACE inhibitor stopped due to AKI, dyspnea on exertion 11/2019 Lexiscan hyperdynamic LVEF and no ischemia.  Last saw Dr. Meda Coffee 04/18/20 and doing well.Exercise recommended.    Past Medical History:  Diagnosis Date  . 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 Anemia of chronic disease   . Arthritis   . 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 Katy Fitch annual f/u, most recent 09/2019  . CAD, non-obstructive 08/26/2006   Cath 07/05:multiple areas of nonobstructive disease = medical & RF mgmt, well preserved global systolic function. Dr. Lia Foyer. Nuclear med stress test 01/2014 : Normal stress nuclear study. LV Ejection Fraction: 76%. LV Wall Motion: NL LV Function; NL Wall Motion.      . Chronic neck and back pain 01/03/2013   Multifactorial. Non opioid requiring.   L2-5 fusion, with hardware at L2-3, stable per MRI 2010. Followed by neurosurgery (Dr. 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    . CKD stage 2 due to type 2 diabetes mellitus ([place]) 12/11/2016  . DEPRESSION 08/26/2006   On paxil    . Diabetic peripheral neuropathy associated with type 2 diabetes mellitus ([place])   . Diabetic retinopathy associated with controlled type 2 diabetes mellitus ([place]) 09/2019   with  macular edema OS - Dr. Katy Fitch  . DYSLIPIDEMIA 11/01/2006  . Gallstones   . GERD 08/26/2006   Heartburn QHS.     [name] Hx of blood transfusion without reported diagnosis    . hx of esophageal stenosis s/p dilatation   . Hx of small intestinal bacterial overgrowth 12/24/2015   Presumed / clinical dx 2016. Complete resolution with metronidazole (insuance would not cover rifaximin) Flare 2017. Did not respond to ABX. Referral to GI B12 and ferritin low supporting malabsorption.   [name] HYPERTENSION 08/26/2006  . Hypertensive heart disease with chronic diastolic congestive heart failure ([place]) 05/01/2020  . Morbid obesity with BMI of 50.0-59.9, adult ([place])   . Personal history of colonic polyps 2020   Adenomatous; f/u 2023 [facility]  . PVD (peripheral vascular disease) ([place]), resolved per f/u ABIs 2016 03/27/2014   2015 LE arterial Duplex - Possible mild inflow disease on the left. 0-49% bilateral SFA disease, without focal stenosis. Three vesel run-off, bilaterally. Medical tx.   . S/P bilateral cataract extraction   . Type 2 diabetes mellitus with complications ([place]) [id number]    Past Surgical History:  Procedure Laterality Date  . CATARACT EXTRACTION, BILATERAL    . CHOLECYSTECTOMY     pt denies 04/07/16  . COLONOSCOPY    . endometrial biospy    . ESOPHAGEAL DILATION    . LEFT HEART CATH    . lumbar fusion surgery  10/08   L3-L5  . POLYPECTOMY    . posterior lumbar interfusion surgery  07/18/07   L2-L3  . rotator cuff surgery Bilateral     Current Medications: No outpatient medications have been marked as taking for the 10/16/20 encounter (Appointment) with Ermalinda Barrios  M, PA-C.     Allergies:   Patient has no known allergies.   Social History   Socioeconomic History  . Marital status: Single    Spouse name: Not on file  . Number of children: 2  . Years of education: Not on file  . Highest education level: Not on file  Occupational History  . Not on file  Tobacco Use  . Smoking status: Passive Smoke Exposure - Never Smoker  . Smokeless tobacco: Never Used  Vaping Use  . Vaping Use: Never used  Substance and Sexual Activity  . Alcohol  use: No    Alcohol/week: 0.0 standard drinks  . Drug use: No  . Sexual activity: Not on file  Other Topics Concern  . 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 Has two daughters and several grand kids. Severe limitation in ambulation. Occ goes out to church but usually daughter gets groceries.    She is one of 7 kids and the oldest so helped raise the other 6.   Social Determinants of Health   Financial Resource Strain: Not on file  Food Insecurity: Not on file  Transportation Needs: Not on file  Physical Activity: Not on file  Stress: Not on file  Social Connections: Not on file     Family History:  The patient's ***family history includes Cirrhosis in her mother; Diabetes in her father and sister; Hyperlipidemia in her sister; Hypertension in her father and sister.   ROS:   Please see the history of present illness.    ROS All other systems reviewed and are negative.   PHYSICAL EXAM:   VS:  There were no vitals taken for this visit.  Physical Exam  GEN: Well nourished, well developed, in no acute distress  HEENT: normal  Neck: no JVD, carotid bruits, or masses Cardiac:RRR; no murmurs, rubs, or gallops  Respiratory:  clear to auscultation bilaterally, normal work of breathing GI: soft, nontender, nondistended, + BS Ext: without cyanosis, clubbing, or edema, Good distal pulses bilaterally MS: no deformity or atrophy  Skin: warm and dry, no rash Neuro:  Alert and Oriented x 3, Strength and sensation are intact Psych: euthymic mood, full affect  Wt Readings from Last 3 Encounters:  09/12/20 225 lb 1.6 oz (102.1 kg)  08/30/20 221 lb 12.8 oz (100.6 kg)  08/23/20 221 lb (100.2 kg)      Studies/Labs Reviewed:   EKG:  EKG is*** ordered today.  The ekg ordered today demonstrates ***  Recent Labs: 05/02/2020: BUN 24; Creatinine, Ser 0.83; Potassium 4.7; Sodium 141; TSH 3.530 08/08/2020: Hemoglobin 11.9; Platelets 305   Lipid  Panel    Component Value Date/Time   CHOL 197 08/08/2020 1038   TRIG 166 (H) 08/08/2020 1038   HDL 56 08/08/2020 1038   CHOLHDL 3.5 08/08/2020 1038   CHOLHDL 1.9 12/30/2015 0832   VLDL 12 12/30/2015 0832   LDLCALC 112 (H) 08/08/2020 1038    Additional studies/ records that were reviewed today include:  NST 12/12/19 Study Highlights   Nuclear stress EF: 78%.  The left ventricular ejection fraction is hyperdynamic (>65%).  There was no ST segment deviation noted during stress.  The study is normal.  This is a low risk study.  Echo 02/26/17 Study Conclusions   - Left ventricle: The cavity size was normal. Wall thickness was    normal. Systolic function was vigorous. The estimated ejection    fraction was in the  range of 65% to 70%. Wall motion was normal;    there were no regional wall motion abnormalities. Doppler    parameters are consistent with abnormal left ventricular    relaxation (grade 1 diastolic dysfunction).  - Aortic valve: There was mild regurgitation.  - Mitral valve: There was mild regurgitation.  - Pulmonary arteries: Systolic pressure was mildly increased.   Impressions:   - Vigorous LV systolic function; mild diastolic dysfunction;    sclerotic aortic valve with mild AI; mild MR; mild TR with mildly    elevated pulmonary pressure.     Risk Assessment/Calculations:   {Does this patient have ATRIAL FIBRILLATION?:[phone]}     ASSESSMENT:    1. Coronary artery disease involving native coronary artery of native heart without angina pectoris   2. Chronic diastolic CHF (congestive heart failure) ([place])   3. Essential hypertension   4. Hyperlipidemia, unspecified hyperlipidemia type   5. Type 2 diabetes mellitus with complications ([place])   6. PVD (peripheral vascular disease) ([place])      PLAN:  In order of problems listed above:  CAD nonobstructive on cath in 2005 medical management, Lexiscan NST 12/2019 - for ischemia patient felt to be  deconditioned  Chronic diastolic CHF  Hypertension  HLD  DM  PVD on Dopplers 2015  Shared Decision Making/Informed Consent   {Are you ordering a CV Procedure (e.g. stress test, cath, DCCV, TEE, etc)?   Press F2        :[id number]    Medication Adjustments/Labs and Tests Ordered: Current medicines are reviewed at length with the patient today.  Concerns regarding medicines are outlined above.  Medication changes, Labs and Tests ordered today are listed in the Patient Instructions below. There are no Patient Instructions on file for this visit.   Sumner Boast, PA-C  10/14/2020 11:26 AM    [facility] [address], [place], [place]  [address] Phone: [phone]; Fax: [phone]

## 2020-10-16 ENCOUNTER — Ambulatory Visit: Payer: Medicare Other | Admitting: Physician Assistant

## 2020-10-16 DIAGNOSIS — I2510 Atherosclerotic heart disease of native coronary artery without angina pectoris: Secondary | ICD-10-CM

## 2020-10-16 DIAGNOSIS — E118 Type 2 diabetes mellitus with unspecified complications: Secondary | ICD-10-CM

## 2020-10-16 DIAGNOSIS — E785 Hyperlipidemia, unspecified: Secondary | ICD-10-CM

## 2020-10-16 DIAGNOSIS — I10 Essential (primary) hypertension: Secondary | ICD-10-CM

## 2020-10-16 DIAGNOSIS — I739 Peripheral vascular disease, unspecified: Secondary | ICD-10-CM

## 2020-10-16 DIAGNOSIS — I5032 Chronic diastolic (congestive) heart failure: Secondary | ICD-10-CM

## 2020-11-22 ENCOUNTER — Other Ambulatory Visit: Payer: Self-pay

## 2020-11-22 ENCOUNTER — Ambulatory Visit (INDEPENDENT_AMBULATORY_CARE_PROVIDER_SITE_OTHER): Payer: 59 | Admitting: Podiatry

## 2020-11-22 ENCOUNTER — Encounter: Payer: Self-pay | Admitting: Podiatry

## 2020-11-22 DIAGNOSIS — M79675 Pain in left toe(s): Secondary | ICD-10-CM | POA: Diagnosis not present

## 2020-11-22 DIAGNOSIS — L84 Corns and callosities: Secondary | ICD-10-CM

## 2020-11-22 DIAGNOSIS — M79674 Pain in right toe(s): Secondary | ICD-10-CM

## 2020-11-22 DIAGNOSIS — Q72899 Other reduction defects of unspecified lower limb: Secondary | ICD-10-CM

## 2020-11-22 DIAGNOSIS — B351 Tinea unguium: Secondary | ICD-10-CM

## 2020-11-22 DIAGNOSIS — E1142 Type 2 diabetes mellitus with diabetic polyneuropathy: Secondary | ICD-10-CM | POA: Diagnosis not present

## 2020-11-29 ENCOUNTER — Ambulatory Visit: Payer: Medicare Other

## 2020-11-29 NOTE — Progress Notes (Signed)
°  Subjective:  Patient ID: [name] [name], female    DOB: 06-27-1946,  MRN: [id number]  75 y.o. female presents with at risk foot care with history of diabetic neuropathy and painful callus(es) both feet and painful thick toenails that are difficult to trim. Pain interferes with ambulation. Aggravating factors include wearing enclosed shoe gear. Pain is relieved with periodic professional debridement.   She statesher blood glucose was 235 mg/dl this morning. She voices no new pedal problems on today's visit.  No Known Allergies   Objective:   Constitutional Pt is a pleasant 75 y.o. African American female obese in NAD.[name] AAO x 3.   Vascular Neurovascular status unchanged b/l lower extremities. Capillary refill time to digits immediate b/l. Palpable DP pulse(s) b/l lower extremities Nonpalpable PT pulse(s) b/l lower extremities. Pedal hair present. Lower extremity skin temperature gradient within normal limits. No pain with calf compression b/l. No edema noted b/l lower extremities. No ischemia or gangrene noted b/l lower extremities. No cyanosis or clubbing noted.  Neurologic Normal speech. Oriented to person, place, and time. Protective sensation diminished with 10g monofilament b/l. Proprioception intact bilaterally.  Dermatologic Pedal skin with normal turgor, texture and tone bilaterally. No open wounds bilaterally. No interdigital macerations bilaterally. Toenails 1-5 b/l elongated, discolored, dystrophic, thickened, crumbly with subungual debris and tenderness to dorsal palpation. Hyperkeratotic lesion(s) submet head 1 left foot, submet head 1 right foot, submet head 5 left foot and submet head 5 right foot.  No erythema, no edema, no drainage, no flocculence.  Orthopedic: Normal muscle strength 5/5 to all lower extremity muscle groups bilaterally. No pain crepitus or joint limitation noted with ROM b/l. 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   Radiographs: None Assessment:   1. Pain due to  onychomycosis of toenails of both feet   2. Callus   3. Brachymetatarsia   4. Diabetic peripheral neuropathy associated with type 2 diabetes mellitus ([place])    Plan:  Patient was evaluated and treated and all questions answered.  Onychomycosis with pain -Nails palliatively debridement as below. -Educated on self-care  Procedure: Nail Debridement Rationale: Pain Type of Debridement: manual, sharp debridement. Instrumentation: Nail nipper, rotary burr. Number of Nails: 10  -Examined patient. -No new findings. No new orders. -Continue diabetic foot care principles. -Toenails 1-5 b/l were debrided in length and girth with sterile nail nippers and dremel without iatrogenic bleeding.  -Callus(es) submet head 1 left foot, submet head 1 right foot, submet head 5 left foot and submet head 5 right foot pared utilizing sterile scalpel blade without complication or incident. Total number debrided =4. -Patient to report any pedal injuries to medical professional immediately.  Return in about 3 months (around 02/19/2021) for diabetic nail and callus trim.  Marzetta Board, DPM

## 2020-12-11 ENCOUNTER — Encounter: Payer: Self-pay | Admitting: Internal Medicine

## 2020-12-11 NOTE — Progress Notes (Signed)
[name] is here for 15-monthfollow-up of diabetes and to review her other chronic conditions. She states that in the interim she has been feeling quite well, with less chronic pain and no depressed mood symptoms. She has most concerned today about persistent hyperglycemia. She was able to fill the prescription for new Jardiance 10 mg, and her last tablet was yesterday.  She brought her medicines in today for review, as she always does-this is so appreciated. She is taking every medicine listed in her record.  See problem based documentation for additional issues.  Patient Active Problem List   Diagnosis Date Noted  . 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 Obesity, Class III, BMI 40-49.9 (morbid obesity) ([place]   . Anemia of chronic disease   . Arthritis of both glenohumeral joints 01/30/2020  . Arthritis of neck 01/30/2020  . Personal history of colonic polyps 2020  . Gait instability 03/03/2018  . Aortic atherosclerosis ([place] 06/17/2017  . Right carotid bruit 04/08/2017  . CKD stage 2 due to type 2 diabetes mellitus ([place] 12/11/2016  . Diabetic polyneuropathy associated with type 2 diabetes mellitus ([place] 12/10/2016  . Goals of care, counseling/discussion 01/18/2015  . Hypertensive heart disease with chronic diastolic congestive heart failure ([place] 12/21/2013  . Healthcare maintenance 04/25/2013  . Chronic neck and back pain 01/03/2013  . Vitamin B12 deficiency due to intestinal malabsorption 12/27/2009  . Vitamin D deficiency 12/27/2009  . Osteopenia 08/01/2008  . Hyperlipidemia 11/01/2006  . Depression 08/26/2006  . HTN (hypertension) 08/26/2006  . Diabetic retinopathy ([place] 08/26/2006  . Type 2 diabetes mellitus with complications ([place] 139/76/7341   Current Outpatient Medications:  .  Accu-Chek FastClix Lancets MISC, USE THREE TIMES DAILY. DIAG CODE E11.51 INSULIN DEPENDENT, Disp: 102 each,  Rfl: 11 .  amLODipine (NORVASC) 5 MG tablet, TAKE 1 TABLET(5 MG) BY MOUTH DAILY, Disp: 90 tablet, Rfl: 3 .  aspirin 81 MG tablet, Take 1 tablet (81 mg total) by mouth daily., Disp: 90 tablet, Rfl: 3 .  atenolol (TENORMIN) 100 MG tablet, TAKE 1 TABLET(100 MG) BY MOUTH DAILY, Disp: 90 tablet, Rfl: 3 .  benazepril (LOTENSIN) 40 MG tablet, TAKE 1 TABLET(40 MG) BY MOUTH DAILY, Disp: 90 tablet, Rfl: 3 .  Blood Glucose Monitoring Suppl (ACCU-CHEK GUIDE) w/Device KIT, 1 each by Does not apply route 3 (three) times daily., Disp: 1 kit, Rfl: 0 .  colestipol (COLESTID) 1 g tablet, Take 1-2 tablets (1-2 g total) by mouth in the morning and at bedtime., Disp: 180 tablet, Rfl: 1 .  empagliflozin (JARDIANCE) 10 MG TABS tablet, Take 1 tablet (10 mg total) by mouth daily before breakfast. For diabetes control., Disp: 90 tablet, Rfl: 3 .  furosemide (LASIX) 40 MG tablet, Take 1 tablet (40 mg total) by mouth daily., Disp: 90 tablet, Rfl: 3 .  glucose blood (ACCU-CHEK GUIDE) test strip, Use to test blood glucose 3 times daily. Dx E11.65, Disp: 100 strip, Rfl: 5 .  Insulin Pen Needle (BD PEN NEEDLE NANO U/F) 32G X 4 MM MISC, USE TO INJECT VICTOZA ONCE A DAY AND LANTUS ONCE A DAY AS DIRECTED, Disp: 100 each, Rfl: 5 .  isosorbide mononitrate (IMDUR) 30 MG 24 hr tablet, Take 1 tablet (30 mg total) by mouth daily., Disp: 90 tablet, Rfl: 3 .  liraglutide (VICTOZA) 18 MG/3ML SOPN, ADMINISTER 1.8 MG UNDER THE SKIN DAILY, Disp: 9 mL, Rfl: 11 .  loratadine (CLARITIN) 10 MG  tablet, TAKE 1 TABLET(10 MG) BY MOUTH DAILY, Disp: 90 tablet, Rfl: 3 .  metFORMIN (GLUCOPHAGE-XR) 500 MG 24 hr tablet, Take 2 tablets (1,000 mg total) by mouth in the morning and at bedtime., Disp: 120 tablet, Rfl: 5 .  nitroGLYCERIN (NITROSTAT) 0.4 MG SL tablet, PLACE 1 TABLET UNDER THE TONGUE EVERY 5 MINUTES AS NEEDED FOR CHEST PAIN, Disp: 25 tablet, Rfl: 0 .  PARoxetine (PAXIL) 40 MG tablet, Take 1 tablet (40 mg total) by mouth every morning., Disp: 90  tablet, Rfl: 3 .  pregabalin (LYRICA) 150 MG capsule, TAKE 1 CAPSULE(150 MG) BY MOUTH DAILY, Disp: 90 capsule, Rfl: 3 .  rosuvastatin (CRESTOR) 40 MG tablet, Take 1 tablet (40 mg total) by mouth daily. For cholesterol., Disp: 90 tablet, Rfl: 3 .  traMADol (ULTRAM) 50 MG tablet, Take 1 tablet (50 mg total) by mouth 2 (two) times daily as needed for severe pain., Disp: 90 tablet, Rfl: 0  Objective: BP (!) 117/56 (BP Location: Left Arm, Patient Position: Sitting, Cuff Size: Small)   Pulse 75   Temp 98.4 F (36.9 C) (Oral)   Ht '4\' 11"'  (1.499 m)   Wt 200 lb 4.8 oz (90.9 kg)   SpO2 100%   BMI 40.46 kg/m   [name] has visibly lost weight, her clothes are fitting more loosely. She is in good spirits today. Foot exam-DP pulses 1-2+ bilateral. No open areas, toenails are nicely trimmed. Sensation is intact to gross touch over the entirety of the foot. Trace pitting edema lower tibias.   Assessment and Plan:  See problem based documentation for details. [name] is doing well, with the exception of loss of diabetes control in the setting of change in medications. She will continue the liraglutide 1.8 mg daily, the Metformin 2000 mg total daily dose, and will change Jardiance from 43m to 25 mg with follow-up in 3 months. We are trying to avoid insulin.  Ophthalmology appointment is later this month. Diarrhea is controlled with Colestid. Desired weight loss is remarkable (liraglutide?) And is likely responsible for decrease chronic pain and improved mood. Tramadol is refilled though she does not take it daily (frequency has decreased).  A1c, bmp, and vitamin D and B12 levels today- check lipids in June  366-monthecheck, earlier if needed.  More than 50% of this 30-minute visit was spent in discussion and counseling.

## 2020-12-12 ENCOUNTER — Encounter: Payer: Self-pay | Admitting: Internal Medicine

## 2020-12-12 ENCOUNTER — Ambulatory Visit (INDEPENDENT_AMBULATORY_CARE_PROVIDER_SITE_OTHER): Payer: 59 | Admitting: Internal Medicine

## 2020-12-12 VITALS — BP 117/56 | HR 75 | Temp 98.4°F | Ht 59.0 in | Wt 200.3 lb

## 2020-12-12 DIAGNOSIS — E1142 Type 2 diabetes mellitus with diabetic polyneuropathy: Secondary | ICD-10-CM | POA: Diagnosis not present

## 2020-12-12 DIAGNOSIS — M542 Cervicalgia: Secondary | ICD-10-CM

## 2020-12-12 DIAGNOSIS — E559 Vitamin D deficiency, unspecified: Secondary | ICD-10-CM

## 2020-12-12 DIAGNOSIS — G8929 Other chronic pain: Secondary | ICD-10-CM

## 2020-12-12 DIAGNOSIS — E538 Deficiency of other specified B group vitamins: Secondary | ICD-10-CM | POA: Diagnosis not present

## 2020-12-12 DIAGNOSIS — M549 Dorsalgia, unspecified: Secondary | ICD-10-CM

## 2020-12-12 DIAGNOSIS — E1122 Type 2 diabetes mellitus with diabetic chronic kidney disease: Secondary | ICD-10-CM | POA: Diagnosis not present

## 2020-12-12 DIAGNOSIS — M85851 Other specified disorders of bone density and structure, right thigh: Secondary | ICD-10-CM

## 2020-12-12 DIAGNOSIS — I700 Atherosclerosis of aorta: Secondary | ICD-10-CM

## 2020-12-12 DIAGNOSIS — N9089 Other specified noninflammatory disorders of vulva and perineum: Secondary | ICD-10-CM

## 2020-12-12 DIAGNOSIS — N907 Vulvar cyst: Secondary | ICD-10-CM

## 2020-12-12 DIAGNOSIS — R1319 Other dysphagia: Secondary | ICD-10-CM

## 2020-12-12 DIAGNOSIS — I5032 Chronic diastolic (congestive) heart failure: Secondary | ICD-10-CM

## 2020-12-12 DIAGNOSIS — G4700 Insomnia, unspecified: Secondary | ICD-10-CM

## 2020-12-12 DIAGNOSIS — E118 Type 2 diabetes mellitus with unspecified complications: Secondary | ICD-10-CM

## 2020-12-12 DIAGNOSIS — K909 Intestinal malabsorption, unspecified: Secondary | ICD-10-CM

## 2020-12-12 DIAGNOSIS — E782 Mixed hyperlipidemia: Secondary | ICD-10-CM

## 2020-12-12 DIAGNOSIS — I110 Hypertensive heart disease with heart failure: Secondary | ICD-10-CM

## 2020-12-12 DIAGNOSIS — K9089 Other intestinal malabsorption: Secondary | ICD-10-CM

## 2020-12-12 DIAGNOSIS — N182 Chronic kidney disease, stage 2 (mild): Secondary | ICD-10-CM

## 2020-12-12 DIAGNOSIS — I10 Essential (primary) hypertension: Secondary | ICD-10-CM

## 2020-12-12 DIAGNOSIS — F325 Major depressive disorder, single episode, in full remission: Secondary | ICD-10-CM

## 2020-12-12 LAB — POCT GLYCOSYLATED HEMOGLOBIN (HGB A1C): Hemoglobin A1C: 10.7 % — AB (ref 4.0–5.6)

## 2020-12-12 LAB — GLUCOSE, CAPILLARY: Glucose-Capillary: 267 mg/dL — ABNORMAL HIGH (ref 70–99)

## 2020-12-12 MED ORDER — ROSUVASTATIN CALCIUM 40 MG PO TABS: 40.0000 mg | ORAL_TABLET | Freq: Every day | ORAL | 3 refills | Status: DC

## 2020-12-12 MED ORDER — FAMOTIDINE 20 MG PO TABS: 20.0000 mg | ORAL_TABLET | Freq: Two times a day (BID) | ORAL | 1 refills | Status: DC | PRN

## 2020-12-12 MED ORDER — EMPAGLIFLOZIN 25 MG PO TABS: 25.0000 mg | ORAL_TABLET | Freq: Every day | ORAL | 3 refills | Status: DC

## 2020-12-12 MED ORDER — COLESTIPOL HCL 1 G PO TABS: 1.0000 g | ORAL_TABLET | Freq: Two times a day (BID) | ORAL | 1 refills | Status: DC

## 2020-12-12 MED ORDER — TRAMADOL HCL 50 MG PO TABS
50.0000 mg | ORAL_TABLET | Freq: Two times a day (BID) | ORAL | 0 refills | Status: DC | PRN
Start: 2020-12-12 — End: 2021-03-17

## 2020-12-12 NOTE — Assessment & Plan Note (Signed)
Vitamin B12 level to be checked today. No new neurologic symptoms or cognitive complaints.

## 2020-12-12 NOTE — Assessment & Plan Note (Signed)
Due for BMP today to assess renal function. On ACEI for renal protection.

## 2020-12-12 NOTE — Assessment & Plan Note (Addendum)
No depression at all!  FEeling great.  No concerns. Continue Paxil 40 mg which she is tolerating well.

## 2020-12-12 NOTE — Assessment & Plan Note (Signed)
Progressive weight loss ongoing, from a BMI over 55 last year to 40.46 today. Liraglutide? She notes that her chronic pain has been much improved, as has her mood, and there may be a relationship here with weight loss. She is up-to-date on cancer screening.

## 2020-12-12 NOTE — Assessment & Plan Note (Signed)
No symptoms or lower extremity edema. Hypertension is well controlled, CHF is compensated. She continues on diuretic in addition to ACEI and BB.

## 2020-12-12 NOTE — Assessment & Plan Note (Addendum)
Symptomatic-nocturnal burning and stinging. She does not experience numbness, and has intact sensation on exam, and dorsalis pedis pulses are palpable. She sees podiatrist for nail care and feet are in good repair. Treated with Lyrica 150 mg daily. She does not feel she wants to increase her dose at this time.

## 2020-12-12 NOTE — Assessment & Plan Note (Signed)
Remains asymptomatic. Will resolve problem, and add to history.

## 2020-12-12 NOTE — Assessment & Plan Note (Signed)
She is not taking new prescription for rosuvastatin 40 mg daily. Follow-up lipids in 3 to 6 months.

## 2020-12-12 NOTE — Assessment & Plan Note (Signed)
Excellent control at 117/56-continue current management and monitor. BMP today to check renal function.

## 2020-12-12 NOTE — Assessment & Plan Note (Signed)
Vitamin D level today to follow-up replacement. Falls, no fractures. Monitor.

## 2020-12-12 NOTE — Assessment & Plan Note (Signed)
Cyst has been more symptomatic - burning, stinging, tender, feeling larger.  Desires referral to GYN.

## 2020-12-12 NOTE — Assessment & Plan Note (Signed)
No reported sleep difficulties.  Will resolve problem.

## 2020-12-12 NOTE — Patient Instructions (Signed)
Ms. Mcgann, It was wonderful to see you this morning, though I do apologize for keeping you waiting so long.    Today we reviewed your medicines, blood pressure, and blood sugar- we also drew some blood for testing, and I'll call you with the results.  Your diabetes is not controlled yet but I have increased the dose of your Jardiance (empagliflozin) from 10 to 25 mg daily.  We will get together in 3 months to see if this has helped.  We are trying to avoid insulin if at all possible.  So happy to hear that your pain and mood have improved and that you're feeling better!  Take care and stay well,  Dr. Jimmye Norman

## 2020-12-12 NOTE — Assessment & Plan Note (Signed)
Dr. Katy Fitch this month, didn't occur in 09/2020.

## 2020-12-12 NOTE — Assessment & Plan Note (Signed)
Continues ASA 81 mg for antiplatelet therapy. Continue risk factor management (DM T2, lipids, HTN). Distal pulses are palpable and she has no claudication. Continue to monitor.

## 2020-12-12 NOTE — Assessment & Plan Note (Signed)
Follow-up level today, post supplementation.

## 2020-12-12 NOTE — Assessment & Plan Note (Addendum)
Uncontrolled, A1c 10.7, significant increase x4 months. Confirm that she is taking Metformin 500 mg 2 p.o. (1000 mg) twice daily, liraglutide 1.8 mg subcu daily, and as of last visit added Jardiance 10 mg daily. Increase dose of Jardiance to 25 mg daily, monitor for tolerance and recheck in 3 months.

## 2020-12-13 LAB — BMP8+ANION GAP
Anion Gap: 19 mmol/L — ABNORMAL HIGH (ref 10.0–18.0)
BUN/Creatinine Ratio: 19 (ref 12–28)
BUN: 20 mg/dL (ref 8–27)
CO2: 22 mmol/L (ref 20–29)
Calcium: 9.4 mg/dL (ref 8.7–10.3)
Chloride: 102 mmol/L (ref 96–106)
Creatinine, Ser: 1.05 mg/dL — ABNORMAL HIGH (ref 0.57–1.00)
Glucose: 276 mg/dL — ABNORMAL HIGH (ref 65–99)
Potassium: 5.6 mmol/L — ABNORMAL HIGH (ref 3.5–5.2)
Sodium: 143 mmol/L (ref 134–144)
eGFR: 55 mL/min/{1.73_m2} — ABNORMAL LOW (ref >59)

## 2020-12-13 LAB — VITAMIN D 25 HYDROXY (VIT D DEFICIENCY, FRACTURES): Vit D, 25-Hydroxy: 21 ng/mL — ABNORMAL LOW (ref 30.0–100.0)

## 2020-12-13 LAB — VITAMIN B12: Vitamin B-12: 642 pg/mL (ref 232–1245)

## 2020-12-17 ENCOUNTER — Other Ambulatory Visit: Payer: Self-pay | Admitting: *Deleted

## 2020-12-17 DIAGNOSIS — E1151 Type 2 diabetes mellitus with diabetic peripheral angiopathy without gangrene: Secondary | ICD-10-CM

## 2020-12-17 DIAGNOSIS — IMO0002 Reserved for concepts with insufficient information to code with codable children: Secondary | ICD-10-CM

## 2020-12-17 MED ORDER — ACCU-CHEK GUIDE VI STRP: ORAL_STRIP | 5 refills | Status: DC

## 2020-12-17 MED ORDER — ATENOLOL 100 MG PO TABS: ORAL_TABLET | ORAL | 3 refills | Status: DC

## 2020-12-19 ENCOUNTER — Telehealth: Payer: Self-pay | Admitting: Internal Medicine

## 2020-12-19 ENCOUNTER — Other Ambulatory Visit: Payer: Self-pay | Admitting: Internal Medicine

## 2020-12-19 ENCOUNTER — Other Ambulatory Visit: Payer: Self-pay | Admitting: *Deleted

## 2020-12-19 DIAGNOSIS — T50905A Adverse effect of unspecified drugs, medicaments and biological substances, initial encounter: Secondary | ICD-10-CM

## 2020-12-19 DIAGNOSIS — K9089 Other intestinal malabsorption: Secondary | ICD-10-CM

## 2020-12-19 DIAGNOSIS — I10 Essential (primary) hypertension: Secondary | ICD-10-CM

## 2020-12-19 MED ORDER — BENAZEPRIL HCL 40 MG PO TABS: ORAL_TABLET | ORAL | 3 refills | Status: DC

## 2020-12-19 MED ORDER — COLESTIPOL HCL 1 G PO TABS: 1.0000 g | ORAL_TABLET | Freq: Two times a day (BID) | ORAL | 3 refills | Status: DC | PRN

## 2020-12-19 NOTE — Telephone Encounter (Signed)
Called to discuss [name] [name] bmp results from last week which showed mild elevation of potassium.  She is advised to cut her benazapril in 1/2 (she has a pill cutter) and to come to clinic in 3-4 weeks for a lab only visit.  She mentions that her fasting blood sugar remains over 200.  Will discuss with diabetes coordinator and return call to [name] [name] next week with a plan.

## 2020-12-26 LAB — HM DIABETES EYE EXAM

## 2020-12-30 ENCOUNTER — Encounter: Payer: Self-pay | Admitting: *Deleted

## 2021-01-06 ENCOUNTER — Other Ambulatory Visit: Payer: Self-pay

## 2021-01-06 ENCOUNTER — Ambulatory Visit
Admission: RE | Admit: 2021-01-06 | Discharge: 2021-01-06 | Disposition: A | Discharge status: Home / Self Care | Payer: 59 | Source: Ambulatory Visit | Attending: Internal Medicine | Admitting: Internal Medicine

## 2021-01-06 DIAGNOSIS — Z1231 Encounter for screening mammogram for malignant neoplasm of breast: Secondary | ICD-10-CM

## 2021-01-09 ENCOUNTER — Ambulatory Visit (INDEPENDENT_AMBULATORY_CARE_PROVIDER_SITE_OTHER): Payer: 59 | Admitting: Internal Medicine

## 2021-01-09 DIAGNOSIS — T50905A Adverse effect of unspecified drugs, medicaments and biological substances, initial encounter: Secondary | ICD-10-CM

## 2021-01-09 DIAGNOSIS — E875 Hyperkalemia: Secondary | ICD-10-CM

## 2021-01-10 LAB — BMP8+ANION GAP
Anion Gap: 16 mmol/L (ref 10.0–18.0)
BUN/Creatinine Ratio: 17 (ref 12–28)
BUN: 22 mg/dL (ref 8–27)
CO2: 22 mmol/L (ref 20–29)
Calcium: 9.2 mg/dL (ref 8.7–10.3)
Chloride: 99 mmol/L (ref 96–106)
Creatinine, Ser: 1.31 mg/dL — ABNORMAL HIGH (ref 0.57–1.00)
Glucose: 225 mg/dL — ABNORMAL HIGH (ref 65–99)
Potassium: 4.3 mmol/L (ref 3.5–5.2)
Sodium: 137 mmol/L (ref 134–144)
eGFR: 42 mL/min/{1.73_m2} — ABNORMAL LOW (ref >59)

## 2021-01-29 ENCOUNTER — Other Ambulatory Visit: Payer: Self-pay

## 2021-01-29 ENCOUNTER — Encounter: Payer: Self-pay | Admitting: Obstetrics & Gynecology

## 2021-01-29 ENCOUNTER — Ambulatory Visit (INDEPENDENT_AMBULATORY_CARE_PROVIDER_SITE_OTHER): Payer: 59 | Admitting: Obstetrics & Gynecology

## 2021-01-29 VITALS — BP 95/58 | HR 80 | Wt 191.0 lb

## 2021-01-29 DIAGNOSIS — B372 Candidiasis of skin and nail: Secondary | ICD-10-CM | POA: Diagnosis not present

## 2021-01-29 DIAGNOSIS — D179 Benign lipomatous neoplasm, unspecified: Secondary | ICD-10-CM | POA: Diagnosis not present

## 2021-01-29 MED ORDER — NYSTATIN 100000 UNIT/GM EX CREA
1.0000 | TOPICAL_CREAM | Freq: Two times a day (BID) | CUTANEOUS | 5 refills | Status: DC
Start: 2021-01-29 — End: 2021-03-17

## 2021-01-29 NOTE — Progress Notes (Signed)
GYNECOLOGY OFFICE VISIT NOTE  History:   [name] [name] is a 75 y.o. G2P1100 here today for evaluation of vulvar bump noticed about 3 years ago.  Sometimes causes irritation but not getting bigger in size.  No drainage.  No bleeding. She denies any abnormal vaginal discharge, bleeding, pelvic pain or other concerns.    Past Medical History:  Diagnosis Date  . 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 Anemia of chronic disease   . Arthritis   . ASCUS with positive high risk HPV 08/03/2014  . 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 Katy Fitch annual f/u, most recent 09/2019  . CAD, non-obstructive 08/26/2006   Cath 07/05:multiple areas of nonobstructive disease = medical & RF mgmt, well preserved global systolic function. Dr. Lia Foyer. Nuclear med stress test 01/2014 : Normal stress nuclear study. LV Ejection Fraction: 76%. LV Wall Motion: NL LV Function; NL Wall Motion.      . Chronic neck and back pain 01/03/2013   Multifactorial. Non opioid requiring.   L2-5 fusion, with hardware at L2-3, stable per MRI 2010. Followed by neurosurgery (Dr. 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    . CKD stage 2 due to type 2 diabetes mellitus ([place]) 12/11/2016  . DEPRESSION 08/26/2006   On paxil    . Diabetic peripheral neuropathy associated with type 2 diabetes mellitus ([place])   . Diabetic retinopathy associated with controlled type 2 diabetes mellitus ([place]) 09/2019   with  macular edema OS - Dr. Katy Fitch  . DYSLIPIDEMIA 11/01/2006  . Gallstones   . GERD 08/26/2006   Heartburn QHS.     [name] Hx of blood transfusion without reported diagnosis   . Hx of esophageal dysphagia requiring dilatation. 03/03/2018  . hx of esophageal stenosis s/p dilatation   . Hx of small intestinal bacterial overgrowth 12/24/2015   Presumed / clinical dx 2016. Complete resolution with metronidazole (insuance would not cover rifaximin) Flare 2017. Did  not respond to ABX. Referral to GI B12 and ferritin low supporting malabsorption.   [name] HYPERTENSION 08/26/2006  . Hypertensive heart disease with chronic diastolic congestive heart failure ([place]) 05/01/2020  . Morbid obesity with BMI of 50.0-59.9, adult ([place])   . Personal history of colonic polyps 2020   Adenomatous; f/u 2023 [facility]  . PVD (peripheral vascular disease) ([place]), resolved per f/u ABIs 2016 03/27/2014   2015 LE arterial Duplex - Possible mild inflow disease on the left. 0-49% bilateral SFA disease, without focal stenosis. Three vesel run-off, bilaterally. Medical tx.   . S/P bilateral cataract extraction   . Type 2 diabetes mellitus with complications ([place]) 76/73/4193    Past Surgical History:  Procedure Laterality Date  . CATARACT EXTRACTION, BILATERAL    . CHOLECYSTECTOMY     pt denies 04/07/16  . COLONOSCOPY    . endometrial biospy    . ESOPHAGEAL DILATION    . LEFT HEART CATH    . lumbar fusion surgery  10/08   L3-L5  . POLYPECTOMY    . posterior lumbar interfusion surgery  07/18/07   L2-L3  . rotator cuff surgery Bilateral     The following portions of the patient's history were reviewed and updated as appropriate: allergies, current medications, past family history, past medical history, past social history, past surgical history and problem list.   Health Maintenance:  Normal pap and negative HRHPV in 2015  Normal mammogram on 12/29/2020.   Review of Systems:  Pertinent items noted in HPI and remainder of comprehensive ROS otherwise negative.  Physical Exam:  BP (!) 95/58   Pulse 80   Wt 191 lb (86.6 kg)   BMI 38.58 kg/m  CONSTITUTIONAL: Well-developed, well-nourished female in no acute distress.  NEUROLOGIC: Alert and oriented to person, place, and time. Normal muscle tone coordination. No cranial nerve deficit noted. PSYCHIATRIC: Normal mood and affect. Normal behavior. Normal judgment and thought content. CARDIOVASCULAR: Normal heart rate  noted RESPIRATORY: Effort and breath sounds normal, no problems with respiration noted ABDOMEN: No masses noted. No other overt distention noted.   PELVIC: 7 mm subcutaneous, yellow,  round, mobile mass under superior part of left labium majus consistent with probable lipoma. No erythema, no drainage around lipoma.  Diffuse erythema externally on perineal region though, concerning for candidal infection. No lesions noted.  Performed in the presence of a chaperone     Assessment and Plan:      1. Yeast infection of the skin Nystatin cream prescribed for skin yeast infection. - nystatin cream (MYCOSTATIN); Apply 1 application topically 2 (two) times daily.  Dispense: 60 g; Refill: 5  2. Lipoma, unspecified site Reassured by benign appearance, patient also reassured.  Routine preventative health maintenance measures emphasized. Please refer to After Visit Summary for other counseling recommendations.   Return for any gynecologic concerns.    I spent 20 minutes dedicated to the care of this patient including pre-visit review of records, face to face time with the patient discussing her conditions and treatments and post visit ordering of testing.    Verita Schneiders, MD, [place] for [employer] [facility], [place]

## 2021-02-04 ENCOUNTER — Other Ambulatory Visit: Payer: Self-pay

## 2021-02-04 MED ORDER — LORATADINE 10 MG PO TABS: ORAL_TABLET | ORAL | 3 refills | Status: DC

## 2021-02-17 ENCOUNTER — Telehealth: Payer: Self-pay | Admitting: Internal Medicine

## 2021-02-17 NOTE — Telephone Encounter (Signed)
Error

## 2021-02-17 NOTE — Telephone Encounter (Signed)
[facility], took a message and routed to Dr. Jimmye Norman. It is not an acute issue.

## 2021-02-17 NOTE — Telephone Encounter (Signed)
Returned her call: she reports her blood sugars are up and down, getting up into the 300s, then back to the 200s and then back up again. Began when they "Took me off my insulin and that is when thy stared doing that.High since then; the other medicine she is giving me is not working". She has an appointment coming up on 03/13/21 with Dr. Jimmye Norman.

## 2021-02-17 NOTE — Telephone Encounter (Signed)
Pt called to report her Blood Sugar are running over 300 and would like a call back.

## 2021-02-20 ENCOUNTER — Other Ambulatory Visit: Payer: Self-pay

## 2021-02-20 ENCOUNTER — Ambulatory Visit
Admission: RE | Admit: 2021-02-20 | Discharge: 2021-02-20 | Disposition: A | Discharge status: Home / Self Care | Payer: 59 | Source: Ambulatory Visit | Attending: Sports Medicine | Admitting: Sports Medicine

## 2021-02-20 ENCOUNTER — Ambulatory Visit (INDEPENDENT_AMBULATORY_CARE_PROVIDER_SITE_OTHER): Payer: 59 | Admitting: Sports Medicine

## 2021-02-20 VITALS — BP 122/56 | Ht 59.0 in | Wt 219.0 lb

## 2021-02-20 DIAGNOSIS — M25511 Pain in right shoulder: Secondary | ICD-10-CM

## 2021-02-20 DIAGNOSIS — M542 Cervicalgia: Secondary | ICD-10-CM

## 2021-02-20 DIAGNOSIS — M25512 Pain in left shoulder: Secondary | ICD-10-CM

## 2021-02-20 DIAGNOSIS — M47812 Spondylosis without myelopathy or radiculopathy, cervical region: Secondary | ICD-10-CM | POA: Diagnosis not present

## 2021-02-20 DIAGNOSIS — G8929 Other chronic pain: Secondary | ICD-10-CM

## 2021-02-20 DIAGNOSIS — M549 Dorsalgia, unspecified: Secondary | ICD-10-CM

## 2021-02-20 MED ORDER — METHYLPREDNISOLONE ACETATE 80 MG/ML IJ SUSP
80.0000 mg | Freq: Once | INTRAMUSCULAR | Status: AC
  Administered 2021-02-20: 80 mg via INTRAMUSCULAR

## 2021-02-21 NOTE — Progress Notes (Signed)
   Subjective:    Patient ID: [name] [name], female    DOB: 29-Mar-1946, 75 y.o.   MRN: [id number]  HPI chief complaint: Joint pain  Patient presents today complaining of diffuse arthralgias.  She is complaining of pain in her neck, both shoulders, and low back.  No trauma.  She has had similar pain in the past which is responded to IM Depo-Medrol injections.  She was last seen for this issue in our office a little over a year ago.  She has not had any recent imaging.  She denies any swelling.  She is here today with her daughter.  Intra medical history reviewed Medications reviewed Allergies reviewed    Review of Systems    As above Objective:   Physical Exam  Well-developed, well-nourished.  Sitting comfortably in the exam room  Cervical spine: Limited cervical range of motion.  No midline tenderness to palpation.  No spasm.  No focal neurological deficit of either upper extremity.  Bilateral shoulders: Limited range of motion primarily secondary to pain.  No tenderness to palpation.  No swelling.  Good strength.  Lumbar spine: Limited range of motion secondary to pain.  No tenderness to palpation or percussion of the lumbar midline.  Some tenderness to palpation along the paraspinal musculature.  No focal neurological deficit of either lower extremity.      Assessment & Plan:   Diffuse arthralgia  Since her symptoms are identical to what she experienced last year and she has responded well to IM Depo-Medrol injections in the past, we have elected to repeat an 80 mg IM Depo-Medrol injection today.  I would like to get some imaging of her cervical spine, both shoulders, and lumbar spine.  Phone follow-up with those results when available.

## 2021-02-25 ENCOUNTER — Other Ambulatory Visit: Payer: Self-pay | Admitting: *Deleted

## 2021-02-25 DIAGNOSIS — R918 Other nonspecific abnormal finding of lung field: Secondary | ICD-10-CM

## 2021-02-26 ENCOUNTER — Other Ambulatory Visit: Payer: Self-pay

## 2021-02-26 ENCOUNTER — Ambulatory Visit
Admission: RE | Admit: 2021-02-26 | Discharge: 2021-02-26 | Disposition: A | Discharge status: Home / Self Care | Payer: 59 | Source: Ambulatory Visit | Attending: Sports Medicine | Admitting: Sports Medicine

## 2021-02-26 ENCOUNTER — Telehealth: Payer: Self-pay | Admitting: Sports Medicine

## 2021-02-26 DIAGNOSIS — R918 Other nonspecific abnormal finding of lung field: Secondary | ICD-10-CM

## 2021-02-26 NOTE — Telephone Encounter (Signed)
  Patient will be notified of x-ray results via telephone.  Left shoulder x-ray shows significant glenohumeral degenerative changes and a 1.4 cm loose body.  Right shoulder shows some acromioclavicular joint DJD but the glenohumeral joint is fairly well-preserved.  Her lumbar spine x-rays show a fusion from L2-L5 with hardware intact.  Nothing acute.  Overall, this x-ray is unchanged when compared to a lumbar spine x-ray from February 2020.  Cervical spine x-rays show degenerative disc disease at C6-C7.  There is mention also of a mild increased density in the right upper lobe compared to the left which is nonspecific.  Radiologist has recommended a PA and lateral chest x-ray which we will order.  Patient will be notified of those results when available.

## 2021-02-27 ENCOUNTER — Other Ambulatory Visit: Payer: Self-pay

## 2021-02-27 NOTE — Telephone Encounter (Signed)
Hi Dr. Jimmye Norman,   There is also a refill request for Metformin ER 500mg , 2 tabs in the a.m. and 2 tabs QHS. This is not listed on her med-list.  Your office visit note from 3/3 states to continue this medication.  Please review and refill if this is appropriate. Thank you, Jennings Stirling

## 2021-03-03 ENCOUNTER — Telehealth: Payer: Self-pay | Admitting: Sports Medicine

## 2021-03-03 ENCOUNTER — Other Ambulatory Visit: Payer: Self-pay

## 2021-03-03 DIAGNOSIS — R918 Other nonspecific abnormal finding of lung field: Secondary | ICD-10-CM

## 2021-03-03 NOTE — Telephone Encounter (Signed)
   I spoke with [facility] on the phone today after reviewing a recent chest x-ray that was ordered after a questionable pulmonary nodule was seen on her cervical spine x-rays.  The chest x-ray also shows this same subtle density over the right upper lobe.  Radiologist recommends proceeding with a noncontrast chest CT.  I explained to [name] [name] that we will order this and I will call her with those results when I have them.

## 2021-03-04 ENCOUNTER — Telehealth: Payer: Self-pay

## 2021-03-04 ENCOUNTER — Telehealth: Payer: Self-pay | Admitting: Dietician

## 2021-03-04 MED ORDER — AMLODIPINE BESYLATE 5 MG PO TABS: ORAL_TABLET | ORAL | 3 refills | Status: DC

## 2021-03-04 NOTE — Telephone Encounter (Signed)
liraglutide (VICTOZA) 18 MG/3ML SOPN, refill request @  [facility] [phone] - [place], [place] AT [address] [place] Phone:  [phone]  Fax:  [phone]

## 2021-03-04 NOTE — Telephone Encounter (Signed)
Has not heard from [place] since she called about her high blood sugars. She says "somethings is going on, if I eat it goes up to 300s," otherwise it is in the 200s.  States she is almost out of Metformin after getting an emergency supply form the pharmacy. See note from 02/27/21 triage nurse [place]. Patient would like a call back. Ms. Faro was informed that her victoza has been refilled and she stated that she takes jardiance, victoza and metformin for her diabetes.

## 2021-03-04 NOTE — Telephone Encounter (Signed)
Butch Penny spoke with Patient today who stated she had not heard from PCP. Requesting a call from PCP to discuss below.

## 2021-03-04 NOTE — Telephone Encounter (Signed)
Return call to pt. Stated she needs refill on Metformin (stated she had to get emergency supply from the pharmacy) which is not on current med list. Stated she takes 2 tablets twice a day. She takes metformin,jardiance, and victoza but her BS's are between 200 - 300. Thanks

## 2021-03-04 NOTE — Telephone Encounter (Signed)
9 mL with 11 refills sent 08/22/20. Returned call to patient. No answer. Left message on VM requesting return call.

## 2021-03-05 ENCOUNTER — Other Ambulatory Visit: Payer: Self-pay | Admitting: Internal Medicine

## 2021-03-05 MED ORDER — METFORMIN HCL ER 500 MG PO TB24: 1000.0000 mg | ORAL_TABLET | Freq: Two times a day (BID) | ORAL | 3 refills | Status: DC

## 2021-03-07 ENCOUNTER — Encounter: Payer: Self-pay | Admitting: Podiatry

## 2021-03-07 ENCOUNTER — Ambulatory Visit (INDEPENDENT_AMBULATORY_CARE_PROVIDER_SITE_OTHER): Payer: 59 | Admitting: Podiatry

## 2021-03-07 ENCOUNTER — Other Ambulatory Visit: Payer: Self-pay

## 2021-03-07 DIAGNOSIS — L84 Corns and callosities: Secondary | ICD-10-CM | POA: Diagnosis not present

## 2021-03-07 DIAGNOSIS — E1142 Type 2 diabetes mellitus with diabetic polyneuropathy: Secondary | ICD-10-CM

## 2021-03-07 DIAGNOSIS — B351 Tinea unguium: Secondary | ICD-10-CM | POA: Diagnosis not present

## 2021-03-07 DIAGNOSIS — M79674 Pain in right toe(s): Secondary | ICD-10-CM

## 2021-03-07 DIAGNOSIS — M79675 Pain in left toe(s): Secondary | ICD-10-CM

## 2021-03-10 NOTE — Progress Notes (Signed)
  Subjective:  Patient ID: [name] [name], female    DOB: Feb 15, 1946,  MRN: [id number]  Franceska Strahm presents to clinic today for at risk foot care with history of diabetic neuropathy and callus(es) b/l feet and painful thick toenails that are difficult to trim. Painful toenails interfere with ambulation. Aggravating factors include wearing enclosed shoe gear. Pain is relieved with periodic professional debridement. Painful calluses are aggravated when weightbearing with and without shoegear. Pain is relieved with periodic professional debridement.   She states her blood glucose was 280 mg/dl today. She states it has been running high for the past 1-2 months and her doctor is adjusting her medications.  No Known Allergies  Review of Systems: Negative except as noted in the HPI. Objective:   Constitutional Harryette Shuart is a pleasant 75 y.o. African American female, in NAD. AAO x 3.   Vascular Capillary refill time to digits immediate b/l. Palpable DP pulse(s) b/l lower extremities Nonpalpable PT pulse(s) b/l lower extremities. Pedal hair sparse. Lower extremity skin temperature gradient within normal limits. No pain with calf compression b/l. No cyanosis or clubbing noted.  Neurologic Normal speech. Oriented to person, place, and time. Protective sensation diminished with 10g monofilament b/l. Vibratory sensation intact b/l.  Dermatologic Pedal skin with normal turgor, texture and tone bilaterally. No open wounds bilaterally. No interdigital macerations bilaterally. Toenails 1-5 b/l elongated, discolored, dystrophic, thickened, crumbly with subungual debris and tenderness to dorsal palpation. Hyperkeratotic lesion(s) submet head 1 left foot, submet head 1 right foot, submet head 5 left foot and submet head 5 right foot.  No erythema, no edema, no drainage, no fluctuance.  Orthopedic: Normal muscle strength 5/5 to all lower extremity muscle groups bilaterally. 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  No pain crepitus or joint limitation noted with ROM b/l.   Radiographs: None Assessment:   1. Pain due to onychomycosis of toenails of both feet   2. Callus   3. Diabetic peripheral neuropathy associated with type 2 diabetes mellitus ([place])    Plan:  Patient was evaluated and treated and all questions answered.  Onychomycosis with pain -Nails palliatively debridement as below -Educated on self-care  Procedure: Nail Debridement Rationale: Pain Type of Debridement: manual, sharp debridement. Instrumentation: Nail nipper, rotary burr. Number of Nails: 10 -Examined patient. -Continue diabetic foot care principles. -Patient to continue soft, supportive shoe gear daily. -Toenails 1-5 b/l were debrided in length and girth with sterile nail nippers and dremel without iatrogenic bleeding.  -Callus(es) submet head 1 left foot, submet head 1 right foot, submet head 5 left foot and submet head 5 right foot pared utilizing sterile scalpel blade without complication or incident. Total number debrided =4. -Patient to report any pedal injuries to medical professional immediately. -Patient/POA to call should there be question/concern in the interim.  Return in about 3 months (around 06/07/2021).  Marzetta Board, DPM

## 2021-03-13 ENCOUNTER — Encounter: Payer: Self-pay | Admitting: Internal Medicine

## 2021-03-13 ENCOUNTER — Ambulatory Visit (INDEPENDENT_AMBULATORY_CARE_PROVIDER_SITE_OTHER): Payer: 59 | Admitting: Internal Medicine

## 2021-03-13 VITALS — BP 107/56 | HR 75 | Temp 97.6°F | Ht 59.0 in | Wt 180.9 lb

## 2021-03-13 DIAGNOSIS — K9089 Other intestinal malabsorption: Secondary | ICD-10-CM

## 2021-03-13 DIAGNOSIS — N182 Chronic kidney disease, stage 2 (mild): Secondary | ICD-10-CM

## 2021-03-13 DIAGNOSIS — E118 Type 2 diabetes mellitus with unspecified complications: Secondary | ICD-10-CM | POA: Diagnosis not present

## 2021-03-13 DIAGNOSIS — R0989 Other specified symptoms and signs involving the circulatory and respiratory systems: Secondary | ICD-10-CM

## 2021-03-13 DIAGNOSIS — R200 Anesthesia of skin: Secondary | ICD-10-CM

## 2021-03-13 DIAGNOSIS — R634 Abnormal weight loss: Secondary | ICD-10-CM

## 2021-03-13 DIAGNOSIS — I10 Essential (primary) hypertension: Secondary | ICD-10-CM

## 2021-03-13 DIAGNOSIS — E1122 Type 2 diabetes mellitus with diabetic chronic kidney disease: Secondary | ICD-10-CM

## 2021-03-13 DIAGNOSIS — E7841 Elevated Lipoprotein(a): Secondary | ICD-10-CM

## 2021-03-13 DIAGNOSIS — E559 Vitamin D deficiency, unspecified: Secondary | ICD-10-CM

## 2021-03-13 DIAGNOSIS — E669 Obesity, unspecified: Secondary | ICD-10-CM

## 2021-03-13 DIAGNOSIS — R911 Solitary pulmonary nodule: Secondary | ICD-10-CM

## 2021-03-13 LAB — POCT GLYCOSYLATED HEMOGLOBIN (HGB A1C): Hemoglobin A1C: 13.7 % — AB (ref 4.0–5.6)

## 2021-03-13 LAB — GLUCOSE, CAPILLARY: Glucose-Capillary: 305 mg/dL — ABNORMAL HIGH (ref 70–99)

## 2021-03-13 MED ORDER — PANTOPRAZOLE SODIUM 40 MG PO TBEC: 40.0000 mg | DELAYED_RELEASE_TABLET | Freq: Every day | ORAL | 1 refills | Status: DC

## 2021-03-13 MED ORDER — VICTOZA 18 MG/3ML ~~LOC~~ SOPN: PEN_INJECTOR | SUBCUTANEOUS | 11 refills | Status: DC

## 2021-03-13 MED ORDER — BENAZEPRIL HCL 40 MG PO TABS: ORAL_TABLET | ORAL | 3 refills | Status: DC

## 2021-03-13 NOTE — Assessment & Plan Note (Signed)
Not audible today.  No TIA or stroke events.  DM2 uncontrolled, on statin and ASA and HTN is controlled.

## 2021-03-13 NOTE — Assessment & Plan Note (Signed)
Overly controlled and appears over diuresed (will likely be stopping lasix) . Stop ACEI until renal fxn resulted; medication requirement is decreasing with marked weight loss.  Recheck 2-4 weeks.

## 2021-03-13 NOTE — Assessment & Plan Note (Signed)
Problem managed - takes colestid particularly prior to leaving the house.

## 2021-03-13 NOTE — Assessment & Plan Note (Signed)
AKI noted at 12/3020 visit and at that time asked her to reduce her ACEI in half.  Recheck today.  She appears volume depleted and will likely be stopping lasix.

## 2021-03-13 NOTE — Progress Notes (Signed)
Routine follow-up for [name] [name], though she is concerned about a number of important issues.  Noticing numbness below  posterior waist area, with numbness continuing down the back of the legs associated with weakness (not pain) of the legs and weakness and pain of the back, causing her to eventually stoop over due to difficulty standing up straight.  Getting more difficult for her to do ADLs and IADLs about the house.  Losing weight.  Hair falling out.  No palpitations or heart racing, no change in breathing.  She has a difficult time establishing chronology or duration of the symptoms.  She brings her medicines today and they were reviewed by problems:  DM2: Continues metformin 500 mg long-acting 2 p.o. twice daily, Victoza, and Jardiance.  Blood sugars remain very elevated.  Allergic rhinitis symptoms are controlled with loratadine daily.  She complains of spitting up foamy white to yellow drainage (there is NOT a cough) which she thinks may be coming down the back of her throat.  No ear symptoms, no nasal congestion.  Chronic back pain - still a problem - hurts worse when standing straight, better when bending forward.  Feels ok when sitting.  Lyrica daily, tramadol as needed-most recent dose last week.  "I tried not to take it unless I really really have to"..  Hyperlipidemia: Taking Crestor daily  GERD: Uncontrolled.  Upper chest discomfort, tight, feels like food tube is irritated, like something is stuck in throat.  Dealing with heartburn not relieved by twice daily pepcid.    Heart failure/HTN: Taking all medicines as prescribed.  No shortness of breath or lower extremity edema.  In fact, often feels dizzy when standing upright.  Urine color is dark yellow.  "I drink water all the time".  Chronic diarrhea: Managed.Takes colestid before going out to prevent diarrhea and fecal incontinence.  Patient Active Problem List   Diagnosis Date Noted  . 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 Obesity, Class III, BMI 40-49.9 (morbid obesity) ([place])   . Anemia of chronic disease   . Arthritis of both glenohumeral joints 01/30/2020  . Arthritis of neck 01/30/2020  . Personal history of colonic polyps 2020  . Gait instability 03/03/2018  . Aortic atherosclerosis ([place]) 06/17/2017  . Right carotid bruit 04/08/2017  . CKD stage 2 due to type 2 diabetes mellitus ([place]) 12/11/2016  . Diabetic polyneuropathy associated with type 2 diabetes mellitus ([place]) 12/10/2016  . Goals of care, counseling/discussion 01/18/2015  . Hypertensive heart disease with chronic diastolic congestive heart failure ([place]) 12/21/2013  . Healthcare maintenance 04/25/2013  . Chronic neck and back pain 01/03/2013  . Vitamin B12 deficiency due to intestinal malabsorption 12/27/2009  . Vitamin D deficiency 12/27/2009  . Osteopenia 08/01/2008  . Hyperlipidemia 11/01/2006  . Depression 08/26/2006  . HTN (hypertension) 08/26/2006  . Diabetic retinopathy ([place]) 08/26/2006  . Type 2 diabetes mellitus with complications ([place]) 16/07/9603    Current Outpatient Medications:  .  pantoprazole (PROTONIX) 40 MG tablet, Take 1 tablet (40 mg total) by mouth daily., Disp: 30 tablet, Rfl: 1 .  Accu-Chek FastClix Lancets MISC, USE THREE TIMES DAILY. 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  amLODipine (NORVASC) 5 MG tablet, TAKE 1 TABLET(5 MG) BY MOUTH DAILY, Disp: 90 tablet, Rfl: 3 .  aspirin 81 MG tablet, Take 1 tablet (81 mg total) by mouth daily., Disp: 90 tablet, Rfl: 3 .  atenolol (TENORMIN)  100 MG tablet, TAKE 1 TABLET(100 MG) BY MOUTH DAILY, Disp: 90 tablet, Rfl: 3 .  benazepril (LOTENSIN) 40 MG tablet, TAKE 1/2 TABLET(20 MG) BY MOUTH DAILY, Disp: 90 tablet, Rfl: 3 .  Blood Glucose Monitoring Suppl (ACCU-CHEK GUIDE) w/Device KIT, 1 each by Does not apply route 3 (three) times daily., Disp: 1 kit, Rfl: 0 .  colestipol  (COLESTID) 1 g tablet, Take 1-2 tablets (1-2 g total) by mouth 2 (two) times daily as needed., Disp: 360 tablet, Rfl: 3 .  furosemide (LASIX) 40 MG tablet, Take 1 tablet (40 mg total) by mouth daily., Disp: 90 tablet, Rfl: 3 .  glucose blood (ACCU-CHEK GUIDE) test strip, Use to test blood glucose 3 times daily. Dx E11.65, Disp: 100 strip, Rfl: 5 .  Insulin Pen Needle (BD PEN NEEDLE NANO U/F) 32G X 4 MM MISC, USE TO INJECT VICTOZA ONCE A DAY AND LANTUS ONCE A DAY AS DIRECTED, Disp: 100 each, Rfl: 5 .  isosorbide mononitrate (IMDUR) 30 MG 24 hr tablet, Take 1 tablet (30 mg total) by mouth daily., Disp: 90 tablet, Rfl: 3 .  JARDIANCE 10 MG TABS tablet, Take 10 mg by mouth daily., Disp: , Rfl:  .  liraglutide (VICTOZA) 18 MG/3ML SOPN, ADMINISTER 1.8 MG UNDER THE SKIN DAILY, Disp: 9 mL, Rfl: 11 .  loratadine (CLARITIN) 10 MG tablet, TAKE 1 TABLET(10 MG) BY MOUTH DAILY, Disp: 90 tablet, Rfl: 3 .  metFORMIN (GLUCOPHAGE-XR) 500 MG 24 hr tablet, Take 2 tablets (1,000 mg total) by mouth in the morning and at bedtime., Disp: 120 tablet, Rfl: 3 .  nitroGLYCERIN (NITROSTAT) 0.4 MG SL tablet, PLACE 1 TABLET UNDER THE TONGUE EVERY 5 MINUTES AS NEEDED FOR CHEST PAIN, Disp: 25 tablet, Rfl: 0 .  nystatin cream (MYCOSTATIN), Apply 1 application topically 2 (two) times daily., Disp: 60 g, Rfl: 5 .  PARoxetine (PAXIL) 40 MG tablet, Take 1 tablet (40 mg total) by mouth every morning., Disp: 90 tablet, Rfl: 3 .  pregabalin (LYRICA) 150 MG capsule, TAKE 1 CAPSULE(150 MG) BY MOUTH DAILY, Disp: 90 capsule, Rfl: 3 .  rosuvastatin (CRESTOR) 40 MG tablet, Take 1 tablet (40 mg total) by mouth daily. For cholesterol., Disp: 90 tablet, Rfl: 3 .  traMADol (ULTRAM) 50 MG tablet, Take 1 tablet (50 mg total) by mouth 2 (two) times daily as needed for severe pain., Disp: 90 tablet, Rfl: 0   BP (!) 107/56 (BP Location: Right Arm, Patient Position: Sitting, Cuff Size: Small)   Pulse 75   Temp 97.6 F (36.4 C) (Oral)   Ht 4' 11" (1.499  m)   Wt 180 lb 14.4 oz (82.1 kg)   SpO2 100%   BMI 36.54 kg/m   Has visibly lost significant weight.  No palpable thyroid abnormality.  Hair loss is not apparent on visual inspection, she is wearing her hair in a tight bun.  Lungs are clear to auscultation.  Heart regular rate and rhythm without murmur.  There is a sensory level demarcated just above the buttocks bilaterally, and this numbness continues posteriorly down the legs to the ankles.  Sensation of her anterior trunk and legs is intact.  She does have reduced sensation over the toes bilaterally.  DTRs of triceps biceps brachioradialis and patellae are hyperactive though without clonus.  Babinski absent.  Achilles DTRs 1+ bilaterally.  Normal tone and bulk of the arm and leg musculature.  Skin is smooth, reduce skin turgor throughout.  No lower extremity edema.  DP pulses 1+ bilaterally.    Radial pulses 1+ bilaterally.  Assessment and plan (please also see problem based documentation)  Dramatic weight loss following increase in Victoza last Fall 2021, though she also has severely uncontrolled hyperglycemia.  Check TSH, though suspect weight loss is due to her Victoza.  DM2:  Reducing the Victoza and initiating the insulin (start with Lantus 10 units nightly and monitor fasting CBGs)  will be necessary both to determine if the GLP1 is causing weight loss and to control glucose.  May also need to increase her Jardiance.  BP is low-check renal function and make decision about which medication to reduce or discontinue.  I have asked her for now to stop her ACEI while awaiting results.  Because skin turgor is low, I would not be surprised if renal function is worsened and we will need to discontinue the Lasix.  At last visit, benazapril was reduced 40 to 20 mg in response to increase in creatinine.  Anticipate stopping lasix - last echo2018  showed vigorous EF and only grade 1 diastolic dysfunction.    Speak with Dr. Draper about MRI imaging of the  spine; spinal stenosis suspected.    

## 2021-03-13 NOTE — Assessment & Plan Note (Signed)
TAking rosuvastatin 40 mg daily now.  Recheck in about 68M with a scheduled A1C.

## 2021-03-13 NOTE — Assessment & Plan Note (Signed)
Level improved to 21 in 12/2020; will get her back on a daily supplement, address next visit.

## 2021-03-13 NOTE — Assessment & Plan Note (Signed)
Dramatic weight loss unintended may be a consequence of Victoza; dose increased Fall 2021.  Will reduce Victoza, check TSH.

## 2021-03-13 NOTE — Assessment & Plan Note (Signed)
Severely uncontrolled despite metformin 2000 mg divided doses, liraglutide 1.8 mg daily, Jardiance 10 mg daily (had intended to increase 3 months ago to 25 mg daily but this was not done).  All CBGs are high.  Thirsty.  Losing significant weight.  Due to weight loss will decrease the Victoza, increase Jardiance, and resume Lantus at 15 units daily and adjust according to fasting readings.  Meal coverage insulin may also be needed.

## 2021-03-13 NOTE — Patient Instructions (Addendum)
[name] [name], I'm very glad to have a chance to catch up on your important concerns.   Today we discussed your back pain, numbness, and difficulty walking.  We also talked about your other conditions, including your diabetes which has been out of control  For now, let's do the following:  I will check your thyroid blood test today, to look into why you are losing so much weight and your hair is falling out.  I will check your kidney function test today, which will help [place] know which medicines we can stop for your blood pressure.  We may also determine that we need to back off on your fluid pill.  Your heartburn is not responding to your famotidine.  Please stop it, and replace with new prescription for pantoprazole.  It is a stronger medicine.    I will speak to Dr. Micheline Chapman to determine next steps for your back.  We will have to decide which picture test will best answer the questions about your symptoms.  Please come back in 2-4 weeks, I don't want you to go so long between appointments.  Talk to you soon,  Dr. Jimmye Norman

## 2021-03-14 ENCOUNTER — Telehealth: Payer: Self-pay

## 2021-03-14 ENCOUNTER — Observation Stay (HOSPITAL_COMMUNITY): Payer: 59

## 2021-03-14 ENCOUNTER — Telehealth: Payer: Self-pay | Admitting: Internal Medicine

## 2021-03-14 ENCOUNTER — Inpatient Hospital Stay (HOSPITAL_COMMUNITY)
Admission: AD | Admit: 2021-03-14 | Discharge: 2021-03-17 | DRG: 442 | Disposition: A | Discharge status: Home Health | Payer: 59 | Source: Ambulatory Visit | Attending: Internal Medicine | Admitting: Internal Medicine

## 2021-03-14 DIAGNOSIS — Z20822 Contact with and (suspected) exposure to covid-19: Secondary | ICD-10-CM | POA: Diagnosis present

## 2021-03-14 DIAGNOSIS — I5032 Chronic diastolic (congestive) heart failure: Secondary | ICD-10-CM | POA: Diagnosis present

## 2021-03-14 DIAGNOSIS — B179 Acute viral hepatitis, unspecified: Secondary | ICD-10-CM

## 2021-03-14 DIAGNOSIS — R748 Abnormal levels of other serum enzymes: Secondary | ICD-10-CM

## 2021-03-14 DIAGNOSIS — F32A Depression, unspecified: Secondary | ICD-10-CM | POA: Diagnosis present

## 2021-03-14 DIAGNOSIS — E1142 Type 2 diabetes mellitus with diabetic polyneuropathy: Secondary | ICD-10-CM | POA: Diagnosis present

## 2021-03-14 DIAGNOSIS — K219 Gastro-esophageal reflux disease without esophagitis: Secondary | ICD-10-CM | POA: Diagnosis present

## 2021-03-14 DIAGNOSIS — E860 Dehydration: Secondary | ICD-10-CM | POA: Diagnosis present

## 2021-03-14 DIAGNOSIS — I130 Hypertensive heart and chronic kidney disease with heart failure and stage 1 through stage 4 chronic kidney disease, or unspecified chronic kidney disease: Secondary | ICD-10-CM | POA: Diagnosis present

## 2021-03-14 DIAGNOSIS — E1165 Type 2 diabetes mellitus with hyperglycemia: Secondary | ICD-10-CM | POA: Diagnosis present

## 2021-03-14 DIAGNOSIS — N179 Acute kidney failure, unspecified: Secondary | ICD-10-CM | POA: Diagnosis present

## 2021-03-14 DIAGNOSIS — N182 Chronic kidney disease, stage 2 (mild): Secondary | ICD-10-CM | POA: Diagnosis present

## 2021-03-14 DIAGNOSIS — E1122 Type 2 diabetes mellitus with diabetic chronic kidney disease: Secondary | ICD-10-CM | POA: Diagnosis present

## 2021-03-14 DIAGNOSIS — E118 Type 2 diabetes mellitus with unspecified complications: Secondary | ICD-10-CM

## 2021-03-14 DIAGNOSIS — E785 Hyperlipidemia, unspecified: Secondary | ICD-10-CM | POA: Diagnosis present

## 2021-03-14 DIAGNOSIS — E11311 Type 2 diabetes mellitus with unspecified diabetic retinopathy with macular edema: Secondary | ICD-10-CM | POA: Diagnosis present

## 2021-03-14 DIAGNOSIS — E1151 Type 2 diabetes mellitus with diabetic peripheral angiopathy without gangrene: Secondary | ICD-10-CM | POA: Diagnosis present

## 2021-03-14 DIAGNOSIS — R1011 Right upper quadrant pain: Secondary | ICD-10-CM

## 2021-03-14 DIAGNOSIS — Z9049 Acquired absence of other specified parts of digestive tract: Secondary | ICD-10-CM

## 2021-03-14 DIAGNOSIS — Z833 Family history of diabetes mellitus: Secondary | ICD-10-CM

## 2021-03-14 DIAGNOSIS — I2510 Atherosclerotic heart disease of native coronary artery without angina pectoris: Secondary | ICD-10-CM | POA: Diagnosis present

## 2021-03-14 HISTORY — DX: Acute viral hepatitis, unspecified: B17.9

## 2021-03-14 LAB — CMP14 + ANION GAP
ALT: 542 IU/L (ref 0–32)
AST: 211 IU/L — ABNORMAL HIGH (ref 0–40)
Albumin/Globulin Ratio: 1.4 (ref 1.2–2.2)
Albumin: 4.4 g/dL (ref 3.7–4.7)
Alkaline Phosphatase: 258 IU/L — ABNORMAL HIGH (ref 44–121)
Anion Gap: 19 mmol/L — ABNORMAL HIGH (ref 10.0–18.0)
BUN/Creatinine Ratio: 14 (ref 12–28)
BUN: 26 mg/dL (ref 8–27)
Bilirubin Total: 0.5 mg/dL (ref 0.0–1.2)
CO2: 23 mmol/L (ref 20–29)
Calcium: 10.6 mg/dL — ABNORMAL HIGH (ref 8.7–10.3)
Chloride: 97 mmol/L (ref 96–106)
Creatinine, Ser: 1.82 mg/dL — ABNORMAL HIGH (ref 0.57–1.00)
Globulin, Total: 3.2 g/dL (ref 1.5–4.5)
Glucose: 339 mg/dL — ABNORMAL HIGH (ref 65–99)
Potassium: 4.8 mmol/L (ref 3.5–5.2)
Sodium: 139 mmol/L (ref 134–144)
Total Protein: 7.6 g/dL (ref 6.0–8.5)
eGFR: 29 mL/min/{1.73_m2} — ABNORMAL LOW (ref >59)

## 2021-03-14 LAB — ANEMIA PROFILE B
Basophils Absolute: 0.1 10*3/uL (ref 0.0–0.2)
Basos: 1 %
EOS (ABSOLUTE): 0 10*3/uL (ref 0.0–0.4)
Eos: 0 %
Ferritin: 325 ng/mL — ABNORMAL HIGH (ref 15–150)
Folate: 9.3 ng/mL (ref >3.0)
Hematocrit: 39.4 % (ref 34.0–46.6)
Hemoglobin: 12.8 g/dL (ref 11.1–15.9)
Immature Grans (Abs): 0 10*3/uL (ref 0.0–0.1)
Immature Granulocytes: 0 %
Iron Saturation: 22 % (ref 15–55)
Iron: 84 ug/dL (ref 27–139)
Lymphocytes Absolute: 2.9 10*3/uL (ref 0.7–3.1)
Lymphs: 54 %
MCH: 29.1 pg (ref 26.6–33.0)
MCHC: 32.5 g/dL (ref 31.5–35.7)
MCV: 90 fL (ref 79–97)
Monocytes Absolute: 0.4 10*3/uL (ref 0.1–0.9)
Monocytes: 6 %
Neutrophils Absolute: 2.1 10*3/uL (ref 1.4–7.0)
Neutrophils: 39 %
Platelets: 245 10*3/uL (ref 150–450)
RBC: 4.4 x10E6/uL (ref 3.77–5.28)
RDW: 14.5 % (ref 11.7–15.4)
Retic Ct Pct: 1.3 % (ref 0.6–2.6)
Total Iron Binding Capacity: 382 ug/dL (ref 250–450)
UIBC: 298 ug/dL (ref 118–369)
Vitamin B-12: 985 pg/mL (ref 232–1245)
WBC: 5.5 10*3/uL (ref 3.4–10.8)

## 2021-03-14 LAB — HEPATITIS PANEL, ACUTE
HCV Ab: NONREACTIVE
Hep A IgM: NONREACTIVE
Hep B C IgM: NONREACTIVE
Hepatitis B Surface Ag: NONREACTIVE

## 2021-03-14 LAB — CBC WITH DIFFERENTIAL/PLATELET
Abs Immature Granulocytes: 0.01 10*3/uL (ref 0.00–0.07)
Basophils Absolute: 0.1 10*3/uL (ref 0.0–0.1)
Basophils Relative: 1 %
Eosinophils Absolute: 0 10*3/uL (ref 0.0–0.5)
Eosinophils Relative: 0 %
HCT: 33.1 % — ABNORMAL LOW (ref 36.0–46.0)
Hemoglobin: 11.2 g/dL — ABNORMAL LOW (ref 12.0–15.0)
Immature Granulocytes: 0 %
Lymphocytes Relative: 64 %
Lymphs Abs: 4 10*3/uL (ref 0.7–4.0)
MCH: 29.2 pg (ref 26.0–34.0)
MCHC: 33.8 g/dL (ref 30.0–36.0)
MCV: 86.4 fL (ref 80.0–100.0)
Monocytes Absolute: 0.3 10*3/uL (ref 0.1–1.0)
Monocytes Relative: 6 %
Neutro Abs: 1.8 10*3/uL (ref 1.7–7.7)
Neutrophils Relative %: 29 %
Platelets: 210 10*3/uL (ref 150–400)
RBC: 3.83 MIL/uL — ABNORMAL LOW (ref 3.87–5.11)
RDW: 14 % (ref 11.5–15.5)
WBC: 6.2 10*3/uL (ref 4.0–10.5)
nRBC: 0 % (ref 0.0–0.2)

## 2021-03-14 LAB — COMPREHENSIVE METABOLIC PANEL
ALT: 321 U/L — ABNORMAL HIGH (ref 0–44)
AST: 103 U/L — ABNORMAL HIGH (ref 15–41)
Albumin: 3.5 g/dL (ref 3.5–5.0)
Alkaline Phosphatase: 212 U/L — ABNORMAL HIGH (ref 38–126)
Anion gap: 12 (ref 5–15)
BUN: 28 mg/dL — ABNORMAL HIGH (ref 8–23)
CO2: 25 mmol/L (ref 22–32)
Calcium: 8.9 mg/dL (ref 8.9–10.3)
Chloride: 93 mmol/L — ABNORMAL LOW (ref 98–111)
Creatinine, Ser: 1.97 mg/dL — ABNORMAL HIGH (ref 0.44–1.00)
GFR, Estimated: 26 mL/min — ABNORMAL LOW (ref >60)
Glucose, Bld: 418 mg/dL — ABNORMAL HIGH (ref 70–99)
Potassium: 4.1 mmol/L (ref 3.5–5.1)
Sodium: 130 mmol/L — ABNORMAL LOW (ref 135–145)
Total Bilirubin: 0.8 mg/dL (ref 0.3–1.2)
Total Protein: 7 g/dL (ref 6.5–8.1)

## 2021-03-14 LAB — FERRITIN: Ferritin: 149 ng/mL (ref 11–307)

## 2021-03-14 LAB — GAMMA GT: GGT: 148 U/L — ABNORMAL HIGH (ref 7–50)

## 2021-03-14 LAB — IRON AND TIBC
Iron: 91 ug/dL (ref 28–170)
Saturation Ratios: 21 % (ref 10.4–31.8)
TIBC: 426 ug/dL (ref 250–450)
UIBC: 335 ug/dL

## 2021-03-14 LAB — LACTATE DEHYDROGENASE: LDH: 165 U/L (ref 98–192)

## 2021-03-14 LAB — APTT: aPTT: 30 seconds (ref 24–36)

## 2021-03-14 LAB — PROTIME-INR
INR: 1 (ref 0.8–1.2)
Prothrombin Time: 13.6 seconds (ref 11.4–15.2)

## 2021-03-14 LAB — TSH: TSH: 2.28 u[IU]/mL (ref 0.450–4.500)

## 2021-03-14 LAB — ACETAMINOPHEN LEVEL: Acetaminophen (Tylenol), Serum: <10 ug/mL — ABNORMAL LOW (ref 10–30)

## 2021-03-14 LAB — GLUCOSE, CAPILLARY: Glucose-Capillary: 319 mg/dL — ABNORMAL HIGH (ref 70–99)

## 2021-03-14 MED ORDER — ASPIRIN EC 81 MG PO TBEC
81.0000 mg | DELAYED_RELEASE_TABLET | Freq: Every day | ORAL | Status: DC
  Administered 2021-03-15 – 2021-03-17 (×3): 81 mg via ORAL
  Filled 2021-03-14 (×3): qty 1

## 2021-03-14 MED ORDER — PAROXETINE HCL 20 MG PO TABS
40.0000 mg | ORAL_TABLET | Freq: Every morning | ORAL | Status: DC
  Administered 2021-03-15 – 2021-03-17 (×3): 40 mg via ORAL
  Filled 2021-03-14 (×3): qty 2

## 2021-03-14 MED ORDER — INSULIN ASPART 100 UNIT/ML IJ SOLN
0.0000 [IU] | Freq: Every day | INTRAMUSCULAR | Status: DC
  Administered 2021-03-14: 4 [IU] via SUBCUTANEOUS
  Administered 2021-03-15 – 2021-03-16 (×2): 3 [IU] via SUBCUTANEOUS

## 2021-03-14 MED ORDER — PREGABALIN 75 MG PO CAPS
150.0000 mg | ORAL_CAPSULE | Freq: Every day | ORAL | Status: DC
  Administered 2021-03-15 – 2021-03-17 (×3): 150 mg via ORAL
  Filled 2021-03-14 (×3): qty 2

## 2021-03-14 MED ORDER — PANTOPRAZOLE SODIUM 40 MG PO TBEC
40.0000 mg | DELAYED_RELEASE_TABLET | Freq: Every day | ORAL | Status: DC
  Administered 2021-03-15 – 2021-03-17 (×3): 40 mg via ORAL
  Filled 2021-03-14 (×3): qty 1

## 2021-03-14 MED ORDER — INSULIN ASPART 100 UNIT/ML IJ SOLN
0.0000 [IU] | Freq: Three times a day (TID) | INTRAMUSCULAR | Status: DC
  Administered 2021-03-15: 5 [IU] via SUBCUTANEOUS
  Administered 2021-03-15: 8 [IU] via SUBCUTANEOUS
  Administered 2021-03-15: 5 [IU] via SUBCUTANEOUS
  Administered 2021-03-16: 15 [IU] via SUBCUTANEOUS
  Administered 2021-03-16: 3 [IU] via SUBCUTANEOUS
  Administered 2021-03-16: 5 [IU] via SUBCUTANEOUS

## 2021-03-14 MED ORDER — COLESTIPOL HCL 1 G PO TABS
1.0000 g | ORAL_TABLET | Freq: Two times a day (BID) | ORAL | Status: DC
  Administered 2021-03-14 – 2021-03-17 (×6): 1 g via ORAL
  Filled 2021-03-14 (×7): qty 1

## 2021-03-14 MED ORDER — ENOXAPARIN SODIUM 40 MG/0.4ML IJ SOSY
40.0000 mg | PREFILLED_SYRINGE | INTRAMUSCULAR | Status: DC
  Administered 2021-03-14: 40 mg via SUBCUTANEOUS
  Filled 2021-03-14: qty 0.4

## 2021-03-14 MED ORDER — INSULIN GLARGINE 100 UNIT/ML ~~LOC~~ SOLN
10.0000 [IU] | Freq: Every day | SUBCUTANEOUS | Status: DC
  Administered 2021-03-14: 10 [IU] via SUBCUTANEOUS
  Filled 2021-03-14 (×2): qty 0.1

## 2021-03-14 MED ORDER — AMLODIPINE BESYLATE 5 MG PO TABS: 5.0000 mg | ORAL_TABLET | Freq: Every day | ORAL | Status: DC

## 2021-03-14 MED ORDER — TRAMADOL HCL 50 MG PO TABS: 50.0000 mg | ORAL_TABLET | Freq: Two times a day (BID) | ORAL | Status: DC | PRN

## 2021-03-14 MED ORDER — LACTATED RINGERS IV BOLUS
1000.0000 mL | Freq: Once | INTRAVENOUS | Status: AC
  Administered 2021-03-14: 1000 mL via INTRAVENOUS

## 2021-03-14 NOTE — Progress Notes (Signed)
New Admission Note:   Arrival Method:  Per wheelchair  Mental Orientation:  Alert, oriented x 4  Telemetry: placed on heart monitor showing NSR Assessment: Completed Skin: Dry and intact with no skin issues   IV:  Inserted a peripheral IV G#20 upon admission  Pain: denies any pain or discomfort at this time Tubes: none Safety Measures: Safety Fall Prevention Plan has been given, discussed and signed Admission: Completed [facility] Orientation: Patient has been orientated to the room, unit and staff.  Family: unavailable  Orders have been reviewed and implemented. Will continue to monitor the patient. Call light has been placed within reach and bed alarm has been activated.   Dolores Hoose, RN

## 2021-03-14 NOTE — Telephone Encounter (Signed)
Notified of critical result labs this morning from [facility].  ALT 542   Labs yesterday, several abnormals noted. Will forward to PCP and attending pool to advise. Thank you, SChaplin, RN,BSN

## 2021-03-14 NOTE — Telephone Encounter (Signed)
I tried reaching out to [name] on the telephone numbers provided in our EMR but was unable to reach her. I also tried reaching her daughter but was unsuccessful. I will continue to follow up.

## 2021-03-14 NOTE — Telephone Encounter (Signed)
Spoke with Shirlean Mylar in [place]. Direct admit may be late this evening or tomorrow for Med Surg Obs. Dr Eileen Stanford is aware.

## 2021-03-14 NOTE — Telephone Encounter (Signed)
I have called and left VM with patient and the family contact. Please have front desk send her call back to me, im sitting at [phone]

## 2021-03-14 NOTE — Telephone Encounter (Signed)
I was able to reach [name] [name] to discuss her lab results. She remains in good spirits and denies abdominal pain, nausea, vomiting. She was alert and not confused on the phone. I will place an order for direct admission. I made her aware to keep her phone close to her.

## 2021-03-14 NOTE — H&P (Addendum)
Date: 03/14/2021               Patient Name:  [name] [name] MRN: [id number]  DOB: 01-08-1946 Age / Sex: 75 y.o., female   PCP: Angelica Pou, MD              Medical Service: Internal Medicine Teaching Service              Attending Physician: Dr. Sid Falcon, MD    First Contact: Ethelene Hal, MS3 Pager: [phone]  Second Contact: Dr. Linwood Dibbles Pager: [phone]  Third Contact Dr. Tamsen Snider Pager: [phone]       After Hours (After 5p/  First Contact Pager: [phone]  weekends / holidays): Second Contact Pager: [phone]   Chief Complaint: Acute hepatitis  History of Present Illness:  [name] [name] is a 75 y.o. female with a PMH of T2DM, who presents as a direct admission after findings of elevated liver enzymes yesterday on routine BMP in the internal medicine clinic. BMP demonstrated ALT of 542, AST of 211, and alkaline phosphatase of 258. Overall, she states she feels fine. On further questioning, she does endorse coughing up green, frothy sputum and having epigastric, RUQ, and lower abdominal pain. She states her sputum production started two weeks ago and has been constant. She denies any associated cough, shortness of breath, or chest pain. There have been no sick contacts. The epigastric, RUQ, and lower abdominal all started one week ago. She denies any nausea, vomiting, or constipation. She does endorse diarrhea recently, but it is unchanged from her baseline. She does not have increased pain after meals.  Patient states that she has never had a problem with her liver before. She has not started any new medications. She is unsure of what medications she is taking. On chart review, the only medication changes recently noted were yesterday during her clinic visit where her ACEi and lasix were stopped due to volume depletion and AKI. Victoza was decreased due to a weight loss of around 50 pounds and her jardiance was increased. She has not experienced any confusion  lately; however, she does endorse some dizziness and weakness in her legs and lower abdomen that causes her to need to hold on to the wall sometimes when walking. She has not traveled recently. She has not eaten any new foods or at any new restaurants, but she is thirsty. She does not have a history or family history of blood disorders. She is not sexually active and does not use any substances.  Of note, patient went to Sports Medicine for neck and shoulder pain where they ordered x-rays of bilateral shoulders and c-spine. C-spine demonstrated mild increased density in right upper lobe. CXR was then obtained and confirmed findings of subtle density concerning for pulmonary nodule. CT is scheduled for 03/18/2021.  Meds: Patient unsure of what medications she is taking. Per chart review, PCP Dr. Jimmye Norman said yesterday she is taking all of her medications as prescribed. No outpatient medications have been marked as taking for the 03/14/21 encounter [facility] Encounter).   Allergies: Allergies as of 03/14/2021  . (No Known Allergies)   Past Medical History: Past Medical History:  Diagnosis Date  . 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 Anemia of chronic disease   . Arthritis   . ASCUS with positive high risk HPV 08/03/2014  . 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 Katy Fitch annual f/u, most recent 09/2019  . CAD, non-obstructive 08/26/2006  Cath 07/05:multiple areas of nonobstructive disease = medical & RF mgmt, well preserved global systolic function. Dr. Lia Foyer. Nuclear med stress test 01/2014 : Normal stress nuclear study. LV Ejection Fraction: 76%. LV Wall Motion: NL LV Function; NL Wall Motion.      . Chronic neck and back pain 01/03/2013   Multifactorial. Non opioid requiring.   L2-5 fusion, with hardware at L2-3, stable per MRI 2010. Followed by neurosurgery (Dr. 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     . CKD stage 2 due to type 2 diabetes mellitus ([place]) 12/11/2016  . DEPRESSION 08/26/2006   On paxil    . Diabetic peripheral neuropathy associated with type 2 diabetes mellitus ([place])   . Diabetic retinopathy associated with controlled type 2 diabetes mellitus ([place]) 09/2019   with  macular edema OS - Dr. Katy Fitch  . DYSLIPIDEMIA 11/01/2006  . Gallstones   . GERD 08/26/2006   Heartburn QHS.     [name] Hx of blood transfusion without reported diagnosis   . Hx of esophageal dysphagia requiring dilatation. 03/03/2018  . hx of esophageal stenosis s/p dilatation   . Hx of small intestinal bacterial overgrowth 12/24/2015   Presumed / clinical dx 2016. Complete resolution with metronidazole (insuance would not cover rifaximin) Flare 2017. Did not respond to ABX. Referral to GI B12 and ferritin low supporting malabsorption.   [name] HYPERTENSION 08/26/2006  . Hypertensive heart disease with chronic diastolic congestive heart failure ([place]) 05/01/2020  . Morbid obesity with BMI of 50.0-59.9, adult ([place])   . Personal history of colonic polyps 2020   Adenomatous; f/u 2023 [facility]  . PVD (peripheral vascular disease) ([place]), resolved per f/u ABIs 2016 03/27/2014   2015 LE arterial Duplex - Possible mild inflow disease on the left. 0-49% bilateral SFA disease, without focal stenosis. Three vesel run-off, bilaterally. Medical tx.   . S/P bilateral cataract extraction   . Type 2 diabetes mellitus with complications ([place]) 23/30/0762   Family History: Mother, deceased at 44, cirrhosis Father, deceased at 2, diabetes and HTN Sister, living, diabetes, lives in [place] History: Patient lives in [place] with her female friend. They get along well. She used to work at [employer] and is now on disability. She enjoys watching TV, particularly [name]. She does not drink alcohol, use tobacco, or use other substances. She is not sexually active.  Review of Systems: A complete ROS was negative except as per  HPI.  Physical Exam: Blood pressure (!) 98/51, pulse 86, temperature 98.7 F (37.1 C), resp. rate 16, SpO2 97 %.  GEN: Well-developed, laying in bed in NAD, larger body habitus EENT: Arcus senilis in eyes bilaterally, gaze conjugate, conjunctiva looks slightly yellowed bilaterally, oropharynx not erythematous, mouth and tongue dry CVS: RRR, normal S1/S2, no murmurs, rubs, gallops, 2+ radial and DP pulses RESP: Breathing comfortably on RA, no retractions, wheezes, rhonchi, or crackles ABD: Soft, tender to deep palpation in epigastric are and RUQ, unable to assess liver edge, no obvious ascites, no spider angiomata or fluid wave SKIN: No lesions or rashes  EXT: Moves all extremities equally, 5/5 strength in legs bilaterally, 5/5 dorsiflexion and plantarflexion bilaterally PSY: Conversant, joking and laughing, thought content normal, behavior normal, memory appears normal  Assessment & Plan by Problem: Active Problems:   Acute hepatitis  1. Acute hepatitis Patient endorses epigastric, RUQ, and lower abdominal pain of one week. She has not had any nausea, vomiting, or bowel changes.  No sick contacts. No new medications or changes that would likely cause presentation. No new foods or restaurants. She is not sexually active. She has not traveled recently. She is does not use any substances. She does have a history of gallstones listed in chart but does not have a gallbladder per past GI visit, but patient denies cholecystectomy. Exam overall normal except for slight yellowing of conjunctiva bilaterally. BMP demonstrates ALT 16x ULN at 542, AST 211, alkaline phosphatase 258, and normal total bilirubin of 0.5. Differential at this point includes nonalcoholic fatty liver disease, hepatic malignancy, hepatitis A/B/C, viral hepatitis, Budd-Chiari syndrome, cirrhosis. Will need more testing to determine etiology. -RUQ ultrasound -Gamma GT -Hepatitis A/B/C serologies -Salicylate  level -LDH -CMP -CBC -INR, APTT -Ferritin, iron, and TIBC  2. Hypertension Patient's BP is soft at 98/51. She is taking lasix, amlodipine, benazepril, and atenolol. Due to volume status at [facility] visit, PCP thought about holding lasix. -Will hold lasix, amlodipine, atenolol for now  3. Bile salt-induced diarrhea Patient is taking colestipol 1g 1-2 tablets daily, and it was stable at office visit yesterday. -Continue colestipol   4. CKD stage 2 due to T2DM She is taking benazepril 40 mg. BMP yesterday in clinic shows Cr 1.81 stable from 1.31 two months ago and eGFR is 29, down from 42 two months ago. -Hold ACEi due to BP  5. T2DM BG on CMP yesterday was 339. Hgb A1c 13.7. Decreased victoza due to losing around 50 pounds, increased jardiance, resumed lantus, novolog continued at that time. -Will order lantus 10 units with novolog ssi -Trend CBG  6. HLD Taking rosuvastatin 40 mg daily. Due to known statin-induced liver injury, will hold statin for now. -Hold rosuvastatin  7. Undifferentiated severe pain Patient is taking tramadol 50 mg BID. -Continue tramadol  8. Depression Patient is taking paroxetine 40 mg in the mornings. -Continue paroxetine  9. Diabetic neuropathy Patient is taking pregabalin 150 mg daily. -Continue pregabalin  10. GERD Patient is taking pantoprazole 40 mg daily. -Continue pantoprazole  Dispo: Admit patient to Observation with expected length of stay less than 2 midnights.  Signed: Mikal Plane, Medical Student 03/14/2021, 6:56 PM  Pager: [phone] After 5pm on weekdays and 1pm on weekends: On Call pager [phone]   Attestation for Student Documentation:  I personally was present and performed or re-performed the history, physical exam and medical decision-making activities of this service and have verified that the service and findings are accurately documented in the student's note.  Madalyn Rob, MD 03/14/2021, 8:05 PM

## 2021-03-14 NOTE — TOC Initial Note (Signed)
Transition of Care [facility]) - Initial/Assessment Note    Patient Details  Name: [name] [name] MRN: [id number] Date of Birth: 06-29-1946  [facility] CM/SW Contact:    Verdell Carmine, RN Phone Number: 03/14/2021, 4:25 PM  Clinical Narrative:                 75 YO patient had labs drawn at primary care, increased LFT, suggestive of acute hepatitis, AKI with creatnine of 1.9 here as a direct admit for workup. No confusion, has daughter. May need nutritional consult and HH follow up. CM will follow for needs.   Expected Discharge Plan: [place] Barriers to Discharge: Continued Medical Work up   Patient Goals and CMS Choice        Expected Discharge Plan and Services Expected Discharge Plan: [place] In-house Referral: Nutrition Discharge Planning Services: CM Consult                                          Prior Living Arrangements/Services   Lives with:: Self Patient language and need for interpreter reviewed:: Yes        Need for Family Participation in Patient Care: Yes (Comment) Care giver support system in place?: Yes (comment)   Criminal Activity/Legal Involvement Pertinent to Current Situation/Hospitalization: No - Comment as needed  Activities of Daily Living      Permission Sought/Granted                  Emotional Assessment       Orientation: : Oriented to Self,Oriented to Place,Oriented to  Time,Oriented to Situation Alcohol / Substance Use: Not Applicable Psych Involvement: No (comment)  Admission diagnosis:  Acute hepatitis Patient Active Problem List   Diagnosis Date Noted  . Bilateral leg numbness 03/13/2021  . Right upper lobe pulmonary nodule 03/13/2021  . 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 Obesity, Class II, BMI 35-39.9   . Anemia of chronic disease   . Arthritis of both glenohumeral  joints 01/30/2020  . Arthritis of neck 01/30/2020  . Personal history of colonic polyps 2020  . Gait instability 03/03/2018  . Aortic atherosclerosis ([place]) 06/17/2017  . Right carotid bruit 04/08/2017  . CKD stage 2 due to type 2 diabetes mellitus ([place]) 12/11/2016  . Diabetic polyneuropathy associated with type 2 diabetes mellitus ([place]) 12/10/2016  . Goals of care, counseling/discussion 01/18/2015  . Hypertensive heart disease with chronic diastolic congestive heart failure ([place]) 12/21/2013  . Healthcare maintenance 04/25/2013  . Chronic neck and back pain 01/03/2013  . Vitamin B12 deficiency due to intestinal malabsorption 12/27/2009  . Vitamin D deficiency 12/27/2009  . Osteopenia 08/01/2008  . Hyperlipidemia 11/01/2006  . Depression 08/26/2006  . HTN (hypertension) 08/26/2006  . Diabetic retinopathy ([place]) 08/26/2006  . Type 2 diabetes mellitus with complications ([place]) 26/94/8546   PCP:  Angelica Pou, MD Pharmacy:   [facility] [place] [address] Phone: [phone] Fax: [phone]     Social Determinants of Health (SDOH) Interventions    Readmission Risk Interventions No flowsheet data found.

## 2021-03-14 NOTE — Telephone Encounter (Signed)
I am unable to get in touch with this patient, and clinic is closing soon. The situation is this person is 75 years old, had a routine visit with Dr. Jimmye Norman yesterday, routine labs had a surprising result of acute hepatitis. Her ALT is 16x the ULN, AST is lower, alk phos also elevated, bilirubin is normal. Notes indicate a recent weightloss, but no other acute symptoms. Creatinine is increased to 1.8 as well. I do not see notes of chronic liver disease.   I think the plan from here would be to talk with her about any symptoms of encephalopathy, any exposure to tylenol, alcohol, antibiotics recently, or other new medications. She is on a statin which should be stopped.   I would recommend admission today to our team for evaluation of Acute Hepatitis. I would suggest the following labs:  INR, CBC, and repeat CMP Hepatitis A, B, and C serology Ferritin and TIBC

## 2021-03-14 NOTE — Plan of Care (Signed)
  Problem: Education: Goal: Knowledge of General Education information will improve Description: Including pain rating scale, medication(s)/side effects and non-pharmacologic comfort measures Outcome: Progressing   Problem: Clinical Measurements: Goal: Will remain free from infection Outcome: Progressing Goal: Diagnostic test results will improve Outcome: Progressing   Problem: Activity: Goal: Risk for activity intolerance will decrease Outcome: Progressing   Problem: Pain Managment: Goal: General experience of comfort will improve Outcome: Progressing

## 2021-03-15 ENCOUNTER — Other Ambulatory Visit: Payer: Self-pay

## 2021-03-15 ENCOUNTER — Observation Stay (HOSPITAL_COMMUNITY): Payer: 59

## 2021-03-15 ENCOUNTER — Encounter (HOSPITAL_COMMUNITY): Payer: Self-pay | Admitting: Internal Medicine

## 2021-03-15 DIAGNOSIS — R1011 Right upper quadrant pain: Secondary | ICD-10-CM | POA: Diagnosis not present

## 2021-03-15 DIAGNOSIS — B179 Acute viral hepatitis, unspecified: Principal | ICD-10-CM

## 2021-03-15 LAB — CBC WITH DIFFERENTIAL/PLATELET
Abs Immature Granulocytes: 0.01 10*3/uL (ref 0.00–0.07)
Basophils Absolute: 0.1 10*3/uL (ref 0.0–0.1)
Basophils Relative: 1 %
Eosinophils Absolute: 0 10*3/uL (ref 0.0–0.5)
Eosinophils Relative: 1 %
HCT: 32.8 % — ABNORMAL LOW (ref 36.0–46.0)
Hemoglobin: 11.4 g/dL — ABNORMAL LOW (ref 12.0–15.0)
Immature Granulocytes: 0 %
Lymphocytes Relative: 69 %
Lymphs Abs: 4 10*3/uL (ref 0.7–4.0)
MCH: 29.6 pg (ref 26.0–34.0)
MCHC: 34.8 g/dL (ref 30.0–36.0)
MCV: 85.2 fL (ref 80.0–100.0)
Monocytes Absolute: 0.4 10*3/uL (ref 0.1–1.0)
Monocytes Relative: 7 %
Neutro Abs: 1.2 10*3/uL — ABNORMAL LOW (ref 1.7–7.7)
Neutrophils Relative %: 22 %
Platelets: 199 10*3/uL (ref 150–400)
RBC: 3.85 MIL/uL — ABNORMAL LOW (ref 3.87–5.11)
RDW: 14 % (ref 11.5–15.5)
WBC: 5.7 10*3/uL (ref 4.0–10.5)
nRBC: 0 % (ref 0.0–0.2)

## 2021-03-15 LAB — COMPREHENSIVE METABOLIC PANEL
ALT: 259 U/L — ABNORMAL HIGH (ref 0–44)
AST: 68 U/L — ABNORMAL HIGH (ref 15–41)
Albumin: 3.3 g/dL — ABNORMAL LOW (ref 3.5–5.0)
Alkaline Phosphatase: 179 U/L — ABNORMAL HIGH (ref 38–126)
Anion gap: 8 (ref 5–15)
BUN: 24 mg/dL — ABNORMAL HIGH (ref 8–23)
CO2: 26 mmol/L (ref 22–32)
Calcium: 9.4 mg/dL (ref 8.9–10.3)
Chloride: 104 mmol/L (ref 98–111)
Creatinine, Ser: 1.64 mg/dL — ABNORMAL HIGH (ref 0.44–1.00)
GFR, Estimated: 32 mL/min — ABNORMAL LOW (ref >60)
Glucose, Bld: 310 mg/dL — ABNORMAL HIGH (ref 70–99)
Potassium: 4 mmol/L (ref 3.5–5.1)
Sodium: 138 mmol/L (ref 135–145)
Total Bilirubin: 0.8 mg/dL (ref 0.3–1.2)
Total Protein: 6.5 g/dL (ref 6.5–8.1)

## 2021-03-15 LAB — GLUCOSE, CAPILLARY
Glucose-Capillary: 296 mg/dL — ABNORMAL HIGH (ref 70–99)
Glucose-Capillary: 242 mg/dL — ABNORMAL HIGH (ref 70–99)
Glucose-Capillary: 215 mg/dL — ABNORMAL HIGH (ref 70–99)
Glucose-Capillary: 300 mg/dL — ABNORMAL HIGH (ref 70–99)

## 2021-03-15 LAB — SARS CORONAVIRUS 2 (TAT 6-24 HRS): SARS Coronavirus 2: NEGATIVE

## 2021-03-15 LAB — CK: Total CK: 89 U/L (ref 38–234)

## 2021-03-15 MED ORDER — LACTATED RINGERS IV SOLN: INTRAVENOUS | Status: AC

## 2021-03-15 MED ORDER — ENOXAPARIN SODIUM 30 MG/0.3ML IJ SOSY
30.0000 mg | PREFILLED_SYRINGE | INTRAMUSCULAR | Status: DC
  Administered 2021-03-15 – 2021-03-16 (×2): 30 mg via SUBCUTANEOUS
  Filled 2021-03-15 (×2): qty 0.3

## 2021-03-15 MED ORDER — INSULIN GLARGINE 100 UNIT/ML ~~LOC~~ SOLN
15.0000 [IU] | Freq: Every day | SUBCUTANEOUS | Status: DC
  Administered 2021-03-15: 15 [IU] via SUBCUTANEOUS
  Filled 2021-03-15 (×2): qty 0.15

## 2021-03-15 MED ORDER — IOHEXOL 9 MG/ML PO SOLN
500.0000 mL | ORAL | Status: AC
  Administered 2021-03-15 (×2): 500 mL via ORAL

## 2021-03-15 NOTE — Progress Notes (Signed)
  Date: 03/15/2021  Patient name: [name] [name]  Medical record number: [id number]  Date of birth: Jul 23, 1946   I have seen and evaluated [name] and discussed their care with the Residency Team. Briefly, Ms. Pinho is a 75 year old woman with PMH of DM2 who presented with acute hepatitis of unknown cause.  Work up has found negative results for viral hepatitis.  She did have dry MM and AKI on exam.  She is otherwise without symptoms.  She had a CT of the chest/abdomen/pelvis which did not show any explanation for her liver enzymes.  She is on a statin which has been held.  She has persistent mild abdominal pain.   Vitals:   03/15/21 0421 03/15/21 0923  BP: (!) 97/45 117/61  Pulse: 86 75  Resp: 16 16  Temp: 98 F (36.7 C) 97.7 F (36.5 C)  SpO2: 92% 98%   Gen: Elderly woman sitting in char, very pleasant Eyes: Anicteric sclerae, no injection CV: RR, NR, no murmur Pulm: Breathing comfortably, no wheezing Abd: Obese, Tender to deep palpation all over, worse in the epigastrium, +BS MSK: Normal tone and bulk for age Psych: Pleasant, normal mood.   Assessment and Plan: I have seen and evaluated the patient as outlined above. I agree with the formulated Assessment and Plan as detailed in the residents' note, with the following changes:   1. Acute hepatitis - Mostly likely diagnosis is medication related (statin) with concomitant dehydration (dry MM and AKI on exam).  - Continue to hold statin - Agree with MS 3 Mabe's plan to check autoantibodies and others given severity of elevation of enzymes - Continue to trend hepatic enzymes (they are trending down) - IVF with LR at 150cc/hr for 12 hours - TTE  2. AKI on CKD - IVF with LR at 150cc/hr for 12 hours - Monitor volume status closely  Further work up as indicated in [place] note.   Sid Falcon, MD 6/4/202210:48 AM

## 2021-03-15 NOTE — Hospital Course (Addendum)
Discharge instructions: [name] [name], it was a pleasure getting to know you and take care of you. You were admitted to the hospital for elevated liver enzymes. These values improved with fluids and holding your cholesterol medication. Here are your discharge instructions: Stop taking your benazepril, amlodipine, imdur, atenolol, jardiance, and rosuvastatin. Take your lantus 25 units once daily, metformin 500 mg twice a day, and victoza 1.2 mg daily. Continue drinking lots of water to ensure you are hydrated. Follow up with your PCP, Dr. Jimmye Norman, in one week to make sure you are continuing to improve.

## 2021-03-15 NOTE — Progress Notes (Signed)
Subjective:  Patient continues to do well this morning. She did not sleep good last night because she could not get comfortable. She has a good appetite and has no changes in bowel movements or urination. She continues to endorse no pain.  Objective:  Vital signs in last 24 hours: Vitals:   03/14/21 1638 03/14/21 2028 03/15/21 0421 03/15/21 0923  BP: (!) 98/51 (!) 105/52 (!) 97/45 117/61  Pulse: 86 85 86 75  Resp: _0 Temp: 98.7 F (37.1 C) 98.1 F (36.7 C) 98 F (36.7 C) 97.7 F (36.5 C)  TempSrc:  Oral Oral Oral  SpO2: 97% 95% 92% 98%   Weight change:   Intake/Output Summary (Last 24 hours) at 03/15/2021 1131 Last data filed at 03/15/2021 0600 Gross per 24 hour  Intake 1245.93 ml  Output 0 ml  Net 1245.93 ml   PHYSICAL EXAM  GEN: Well-developed, well-nourished, sitting in chair watching TV EENT: Conjunctiva slightly yellow, arcus senilis bilaterally CVS: RRR, normal S1/S2, no murmurs, rubs, gallops RESP: Breathing comfortably on RA, no retractions, wheezes, rhonchi, or crackles ABD: Soft, normoactive bowel sounds, tenderness to deep palpation diffusely SKIN: No lesions or rashes  EXT: Moves all extremities equally   Assessment/Plan:  Active Problems:   Acute hepatitis  Patient Summary: [name] [name] is a 75 y.o. female with a PMH of CAD, CKD stage 2, HTN, and T2DM on hospital day 1 for evaluation of acute hepatitis.  1. Acute hepatitis Patient continues to feel fine this morning. She endorses diffuse abdominal tenderness on deep palpation today. RUQ [place] yesterday was unremarkable. Hepatitis A/B/C serologies were negative. Salicylate levels, LDH, INR, and APTT were normal. CMP yesterday was notable for sodium of 130, ALT decreased to 321 from 542 two days ago, AST decreased to 103 from 211, and alk phos down to 212 from 258. Corrected sodium 135 in setting of hyperglycemia. GGT level elevated at 148. Ferritin down to 149 yesterday from 325 two days ago. Iron  normal at 91 and TIBC is ULN at 426. This morning, BP was 97/45 before fluids. She has not taken her rosuvastatin since admission. CT chest, abdomen, and pelvis without contrast showed normal liver s/p cholecystectomy, small hiatal hernia, coronary artery calcifications, and aortic atherosclerosis. No pulmonary nodules appreciated. BMP this morning is notable for Na of 138, ALT 259, AST 68, alk phos 179, and total bilirubin 0.8. Given findings, differential at this point is drug-induced hepatotoxicity, primary biliary cholangitis, autoimmune hepatitis, hypoperfusion state, and malignancy. Etiology does not appear structural in nature. Improving LFTs in setting of fluids and cessation of statin suggestive of both hypoperfusion and statin-induced injury. Will need more workup to further assess etiology. -Anti-smooth muscle Ab, ANA, AMA for autoimmune workup -Echocardiogram to assess etiology of hypoperfusion -Trend CMP -Continue IV LR 150 mL/hr  2. Hypertension Patient's BP was soft yesterday on admission and was given 1L LR bolus. BP was still 97/45 this morning. After initiating IV LR, BP normalized to 117/61. She continues to be asymptomatic. -Continue IV LR -Continue to hold lasix, amlodipine, benazepril, and atenolol  3. T2DM Patient's CBG this morning was 242. Previous values overnight ranged in low 300s. BP yesterday showed Glc 418 and this morning Glc 310. -Increase lantus to 15 units daily -Novolog ssi  4. 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 After IV  LR, Cr 1.64, BUN 24, and eGFR 32. Overall picture suggests prerenal AKI due to volume depletion. -Continue IV LR  5. Normocytic anemia CBC was notable for normal WBC, Hgb of 11.2 down from 12.8 yesterday, and MCV of 86.4. This morning, WBC remains normal, Hgb is 11.4, and MCV is 85.2. Labs two days ago were normal;  likely a dilutional component from IV fluids. No evidence of obvious bleeding. She is asymptomatic. -Trend CBC   LOS: 0 days   Mikal Plane, Medical Student 03/15/2021, 11:31 AM Pager: [phone] After 5pm on weekdays and 1pm on weekends: On Call pager [phone]

## 2021-03-16 ENCOUNTER — Observation Stay (HOSPITAL_COMMUNITY): Payer: 59

## 2021-03-16 DIAGNOSIS — E1165 Type 2 diabetes mellitus with hyperglycemia: Secondary | ICD-10-CM | POA: Diagnosis present

## 2021-03-16 DIAGNOSIS — I340 Nonrheumatic mitral (valve) insufficiency: Secondary | ICD-10-CM

## 2021-03-16 DIAGNOSIS — I2510 Atherosclerotic heart disease of native coronary artery without angina pectoris: Secondary | ICD-10-CM | POA: Diagnosis present

## 2021-03-16 DIAGNOSIS — R1011 Right upper quadrant pain: Secondary | ICD-10-CM | POA: Diagnosis not present

## 2021-03-16 DIAGNOSIS — Z20822 Contact with and (suspected) exposure to covid-19: Secondary | ICD-10-CM | POA: Diagnosis present

## 2021-03-16 DIAGNOSIS — I351 Nonrheumatic aortic (valve) insufficiency: Secondary | ICD-10-CM | POA: Diagnosis not present

## 2021-03-16 DIAGNOSIS — I5032 Chronic diastolic (congestive) heart failure: Secondary | ICD-10-CM | POA: Diagnosis present

## 2021-03-16 DIAGNOSIS — N179 Acute kidney failure, unspecified: Secondary | ICD-10-CM | POA: Diagnosis present

## 2021-03-16 DIAGNOSIS — B179 Acute viral hepatitis, unspecified: Secondary | ICD-10-CM | POA: Diagnosis present

## 2021-03-16 DIAGNOSIS — E860 Dehydration: Secondary | ICD-10-CM | POA: Diagnosis present

## 2021-03-16 DIAGNOSIS — E1142 Type 2 diabetes mellitus with diabetic polyneuropathy: Secondary | ICD-10-CM | POA: Diagnosis present

## 2021-03-16 DIAGNOSIS — E785 Hyperlipidemia, unspecified: Secondary | ICD-10-CM | POA: Diagnosis present

## 2021-03-16 DIAGNOSIS — E11311 Type 2 diabetes mellitus with unspecified diabetic retinopathy with macular edema: Secondary | ICD-10-CM | POA: Diagnosis present

## 2021-03-16 DIAGNOSIS — Z9049 Acquired absence of other specified parts of digestive tract: Secondary | ICD-10-CM | POA: Diagnosis not present

## 2021-03-16 DIAGNOSIS — F32A Depression, unspecified: Secondary | ICD-10-CM | POA: Diagnosis present

## 2021-03-16 DIAGNOSIS — Z833 Family history of diabetes mellitus: Secondary | ICD-10-CM | POA: Diagnosis not present

## 2021-03-16 DIAGNOSIS — N182 Chronic kidney disease, stage 2 (mild): Secondary | ICD-10-CM | POA: Diagnosis present

## 2021-03-16 DIAGNOSIS — I361 Nonrheumatic tricuspid (valve) insufficiency: Secondary | ICD-10-CM | POA: Diagnosis not present

## 2021-03-16 DIAGNOSIS — K219 Gastro-esophageal reflux disease without esophagitis: Secondary | ICD-10-CM | POA: Diagnosis present

## 2021-03-16 DIAGNOSIS — I509 Heart failure, unspecified: Secondary | ICD-10-CM

## 2021-03-16 DIAGNOSIS — I130 Hypertensive heart and chronic kidney disease with heart failure and stage 1 through stage 4 chronic kidney disease, or unspecified chronic kidney disease: Secondary | ICD-10-CM | POA: Diagnosis present

## 2021-03-16 DIAGNOSIS — E1151 Type 2 diabetes mellitus with diabetic peripheral angiopathy without gangrene: Secondary | ICD-10-CM | POA: Diagnosis present

## 2021-03-16 DIAGNOSIS — E1122 Type 2 diabetes mellitus with diabetic chronic kidney disease: Secondary | ICD-10-CM | POA: Diagnosis present

## 2021-03-16 LAB — MITOCHONDRIAL ANTIBODIES: Mitochondrial M2 Ab, IgG: <20 Units (ref 0.0–20.0)

## 2021-03-16 LAB — CBC
HCT: 30.6 % — ABNORMAL LOW (ref 36.0–46.0)
Hemoglobin: 10.6 g/dL — ABNORMAL LOW (ref 12.0–15.0)
MCH: 29.7 pg (ref 26.0–34.0)
MCHC: 34.6 g/dL (ref 30.0–36.0)
MCV: 85.7 fL (ref 80.0–100.0)
Platelets: 191 10*3/uL (ref 150–400)
RBC: 3.57 MIL/uL — ABNORMAL LOW (ref 3.87–5.11)
RDW: 13.8 % (ref 11.5–15.5)
WBC: 5.6 10*3/uL (ref 4.0–10.5)
nRBC: 0 % (ref 0.0–0.2)

## 2021-03-16 LAB — COMPREHENSIVE METABOLIC PANEL
ALT: 210 U/L — ABNORMAL HIGH (ref 0–44)
AST: 78 U/L — ABNORMAL HIGH (ref 15–41)
Albumin: 2.9 g/dL — ABNORMAL LOW (ref 3.5–5.0)
Alkaline Phosphatase: 155 U/L — ABNORMAL HIGH (ref 38–126)
Anion gap: 3 — ABNORMAL LOW (ref 5–15)
BUN: 21 mg/dL (ref 8–23)
CO2: 30 mmol/L (ref 22–32)
Calcium: 9.2 mg/dL (ref 8.9–10.3)
Chloride: 106 mmol/L (ref 98–111)
Creatinine, Ser: 1.48 mg/dL — ABNORMAL HIGH (ref 0.44–1.00)
GFR, Estimated: 37 mL/min — ABNORMAL LOW (ref >60)
Glucose, Bld: 233 mg/dL — ABNORMAL HIGH (ref 70–99)
Potassium: 4.2 mmol/L (ref 3.5–5.1)
Sodium: 139 mmol/L (ref 135–145)
Total Bilirubin: 0.5 mg/dL (ref 0.3–1.2)
Total Protein: 6 g/dL — ABNORMAL LOW (ref 6.5–8.1)

## 2021-03-16 LAB — ECHOCARDIOGRAM COMPLETE
Area-P 1/2: 3.63 cm2
P 1/2 time: 359 msec
S' Lateral: 2.8 cm

## 2021-03-16 LAB — GLUCOSE, CAPILLARY
Glucose-Capillary: 175 mg/dL — ABNORMAL HIGH (ref 70–99)
Glucose-Capillary: 382 mg/dL — ABNORMAL HIGH (ref 70–99)
Glucose-Capillary: 230 mg/dL — ABNORMAL HIGH (ref 70–99)
Glucose-Capillary: 295 mg/dL — ABNORMAL HIGH (ref 70–99)

## 2021-03-16 LAB — ANTI-SMOOTH MUSCLE ANTIBODY, IGG: F-Actin IgG: 5 Units (ref 0–19)

## 2021-03-16 MED ORDER — INSULIN GLARGINE 100 UNIT/ML ~~LOC~~ SOLN
15.0000 [IU] | Freq: Every day | SUBCUTANEOUS | Status: DC
  Filled 2021-03-16: qty 0.15

## 2021-03-16 MED ORDER — INSULIN GLARGINE 100 UNIT/ML ~~LOC~~ SOLN
18.0000 [IU] | Freq: Every day | SUBCUTANEOUS | Status: DC
  Administered 2021-03-16: 18 [IU] via SUBCUTANEOUS
  Filled 2021-03-16 (×2): qty 0.18

## 2021-03-16 MED ORDER — INSULIN GLARGINE 100 UNIT/ML ~~LOC~~ SOLN
20.0000 [IU] | Freq: Every day | SUBCUTANEOUS | Status: DC
  Filled 2021-03-16: qty 0.2

## 2021-03-16 NOTE — Evaluation (Signed)
Physical Therapy Evaluation Patient Details Name: [name] [name] MRN: [id number] DOB: 05-25-1946 Today's Date: 03/16/2021   History of Present Illness  [name] [name] is a 75 y.o. female admitted for evaluation of acute heptitis;  with a PMH of CAD, CKD stage 2, HTN, and T2DM  Clinical Impression   Pt admitted with above diagnosis. Comes from home where she lives with a roommate in a single leve hosue with a few steps to enter; Pt expressed specific concerns re: managing bathing with her tub/shower combo, which has a door; Presents to PT with generalized weakness and decr activity tolerance; I anticipate good progress;  Pt currently with functional limitations due to the deficits listed below (see PT Problem List). Pt will benefit from skilled PT to increase their independence and safety with mobility to allow discharge to the venue listed below.       Follow Up Recommendations Home health PT;Other (comment) (HHOT)    Equipment Recommendations  Rolling walker with 5" wheels;3in1 (PT) (Youth-sized RW)    Recommendations for Other Services OT consult;Other (comment) (if dc's before getting OT, rec HHOT)     Precautions / Restrictions Precautions Precautions: Fall Precaution Comments: Fall risk greatly reduced with use of RW      Mobility  Bed Mobility                    Transfers Overall transfer level: Needs assistance Equipment used: Straight cane Transfers: Sit to/from Stand Sit to Stand: Min guard (without physical assist)         General transfer comment: Good push off and rise  Ambulation/Gait Ambulation/Gait assistance: Min guard (without physical contact) Gait Distance (Feet): 80 Feet Assistive device: Rolling walker (2 wheeled);Straight cane Gait Pattern/deviations: Step-through pattern;Trunk flexed     General Gait Details: Overall steady with cane and RW; Able to extend trunk for fully upright standing, but sinks back into stooped posture within a  few minutes  Stairs Stairs: Yes Stairs assistance: Min assist Stair Management: One rail Left;With cane;Forwards;Alternating pattern;Step to pattern (Alternating ascending, step-by-step descending) Number of Stairs: 4 General stair comments: Good use of rails for steadiness  Wheelchair Mobility    Modified Rankin (Stroke Patients Only)       Balance Overall balance assessment: Mild deficits observed, not formally tested                                           Pertinent Vitals/Pain Pain Assessment: Faces Faces Pain Scale: Hurts a little bit Pain Location: abdominal pain Pain Descriptors / Indicators: Aching Pain Intervention(s): Monitored during session    Home Living Family/patient expects to be discharged to:: Private residence Living Arrangements: Non-relatives/Friends Available Help at Discharge: Family;Friend(s);Available PRN/intermittently Type of Home: House Home Access: Stairs to enter Entrance Stairs-Rails: Left Entrance Stairs-Number of Steps: 4-5 Home Layout: One level Home Equipment: Cane - single point;Shower seat      Prior Function Level of Independence: Independent with assistive device(s)         Comments: Uses walker for amb outside the home, cane or furniture walks inside     Hand Dominance        Extremity/Trunk Assessment   Upper Extremity Assessment Upper Extremity Assessment: Generalized weakness    Lower Extremity Assessment Lower Extremity Assessment: Generalized weakness       Communication   Communication: No difficulties  Cognition Arousal/Alertness:  Awake/alert Behavior During Therapy: WFL for tasks assessed/performed Overall Cognitive Status: Within Functional Limits for tasks assessed                                        General Comments      Exercises     Assessment/Plan    PT Assessment Patient needs continued PT services  PT Problem List Decreased strength;Decreased  activity tolerance;Decreased balance;Decreased knowledge of use of DME       PT Treatment Interventions DME instruction;Gait training;Stair training;Functional mobility training;Therapeutic activities;Therapeutic exercise;Patient/family education    PT Goals (Current goals can be found in the Care Plan section)  Acute Rehab PT Goals Patient Stated Goal: Hopes to go home soon PT Goal Formulation: With patient Time For Goal Achievement: 03/30/21 Potential to Achieve Goals: Good    Frequency Min 3X/week   Barriers to discharge        Co-evaluation               AM-PAC PT "6 Clicks" Mobility  Outcome Measure Help needed turning from your back to your side while in a flat bed without using bedrails?: A Little Help needed moving from lying on your back to sitting on the side of a flat bed without using bedrails?: A Little Help needed moving to and from a bed to a chair (including a wheelchair)?: A Little Help needed standing up from a chair using your arms (e.g., wheelchair or bedside chair)?: A Little Help needed to walk in hospital room?: A Little Help needed climbing 3-5 steps with a railing? : A Little 6 Click Score: 18    End of Session Equipment Utilized During Treatment: Gait belt Activity Tolerance: Patient tolerated treatment well Patient left: in chair;with call bell/phone within reach;Other (comment) (Dr. Court Joy in to see pt) Nurse Communication: Mobility status PT Visit Diagnosis: Muscle weakness (generalized) (M62.81)    Time: 3846-6599 PT Time Calculation (min) (ACUTE ONLY): 22 min   Charges:   PT Evaluation $PT Eval Low Complexity: [place], PT  Acute Rehabilitation Services Pager [phone] Office [phone]   Colletta [place] 03/16/2021, 5:45 PM

## 2021-03-16 NOTE — TOC Initial Note (Signed)
Transition of Care [facility]) - Initial/Assessment Note    Patient Details  Name: [name] [name] MRN: [id number] Date of Birth: 01-Sep-1946  Transition of [facility]) CM/SW Contact:    Bartholomew Crews, RN Phone Number: [phone] 03/16/2021, 3:56 PM  Clinical Narrative:                  Spoke with patient at the bedside. PTA home alone. Stated that her daughter lives nearby and checks on her daily and does the cooking. Patient manages her own medications. Daughter drives her to medical appointments. She is aware of her transportation benefit as well.   Discussed recommendations for youth walker and 3-N-1. Referral to [employer] for delivery to her home.   Discussed recommendations for [facility] PT and OT. Offered choice of home health agency. Referral currently pending with [facility].   TOC following for transition needs.   Expected Discharge Plan: [place] Barriers to Discharge: Continued Medical Work up   Patient Goals and CMS Choice Patient states their goals for this hospitalization and ongoing recovery are:: return home CMS Medicare.gov Compare Post Acute Care list provided to:: Patient Choice offered to / list presented to : Patient  Expected Discharge Plan and Services Expected Discharge Plan: [place] In-house Referral: Clinical Social Work Discharge Planning Services: CM Consult Post Acute Care Choice: [place] arrangements for the past 2 months: Single Family Home                 DME Arranged: 3-N-1,Walker rolling DME Agency: Other - Comment Physicist, medical) Date DME Agency Contacted: 03/16/21 Time DME Agency Contacted: [phone] Representative spoke with at DME Agency: [facility] HH Arranged: PT,OT       Representative spoke with at [place]: referral pending  Prior Living Arrangements/Services Living arrangements for the past 2 months: [place] Lives with:: Self Patient language and need for interpreter  reviewed:: Yes Do you feel safe going back to the place where you live?: Yes      Need for Family Participation in Patient Care: Yes (Comment) Care giver support system in place?: Yes (comment) Current home services: DME (cane) Criminal Activity/Legal Involvement Pertinent to Current Situation/Hospitalization: No - Comment as needed  Activities of Daily Living Home Assistive Devices/Equipment: CBG Meter,Eyeglasses,Scales ADL Screening (condition at time of admission) Patient's cognitive ability adequate to safely complete daily activities?: Yes Is the patient deaf or have difficulty hearing?: No Does the patient have difficulty seeing, [name] when wearing glasses/contacts?: No Does the patient have difficulty concentrating, remembering, or making decisions?: No Patient able to express need for assistance with ADLs?: Yes Does the patient have difficulty dressing or bathing?: No Independently performs ADLs?: Yes (appropriate for developmental age) Does the patient have difficulty walking or climbing stairs?: Yes Weakness of Legs: None Weakness of Arms/Hands: None  Permission Sought/Granted                  Emotional Assessment Appearance:: Appears stated age Attitude/Demeanor/Rapport: Engaged Affect (typically observed): Accepting Orientation: : Oriented to Self,Oriented to Place,Oriented to  Time,Oriented to Situation Alcohol / Substance Use: Not Applicable Psych Involvement: No (comment)  Admission diagnosis:  Acute hepatitis [B17.9] Patient Active Problem List   Diagnosis Date Noted  . Right upper quadrant pain   . Acute hepatitis 03/14/2021  . Bilateral leg numbness 03/13/2021  . Right upper lobe pulmonary nodule 03/13/2021  . 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 Obesity, Class II, BMI 35-39.9   . Anemia of chronic disease   . Arthritis of both glenohumeral joints 01/30/2020  .  Arthritis of neck 01/30/2020  . Personal history of colonic polyps 2020  . Gait instability 03/03/2018  . Aortic atherosclerosis ([place]) 06/17/2017  . Right carotid bruit 04/08/2017  . CKD stage 2 due to type 2 diabetes mellitus ([place]) 12/11/2016  . Diabetic polyneuropathy associated with type 2 diabetes mellitus ([place]) 12/10/2016  . Goals of care, counseling/discussion 01/18/2015  . Hypertensive heart disease with chronic diastolic congestive heart failure ([place]) 12/21/2013  . Healthcare maintenance 04/25/2013  . Chronic neck and back pain 01/03/2013  . Vitamin B12 deficiency due to intestinal malabsorption 12/27/2009  . Vitamin D deficiency 12/27/2009  . Osteopenia 08/01/2008  . Hyperlipidemia 11/01/2006  . Depression 08/26/2006  . HTN (hypertension) 08/26/2006  . Diabetic retinopathy ([place]) 08/26/2006  . Type 2 diabetes mellitus with complications ([place]) 15/94/7076   PCP:  Angelica Pou, MD Pharmacy:   [facility] [place], [address] [place] [address] Phone: [phone] Fax: [phone]     Social Determinants of Health (SDOH) Interventions    Readmission Risk Interventions No flowsheet data found.

## 2021-03-16 NOTE — Progress Notes (Signed)
Physical Therapy Note   PT eval complete with full note to follow;   Recommend HHPT/OT for transition out of hospital;   Rec Youth-sized RW, and [place], [place] Pager [phone] Office [phone]

## 2021-03-16 NOTE — Progress Notes (Addendum)
    HD#2 Subjective:  Overnight Events: No acute events overnight  Reports she feels well this morning and is talking on the phone with her daughter on approach. Patient has no abdominal pain at rest.  Tolerating p.o. intake. All questions and concerns addressed.  Objective:  Vital signs in last 24 hours: Vitals:   03/15/21 1656 03/15/21 2037 03/16/21 0546 03/16/21 0954  BP: 122/72 116/70 110/61 (!) 101/57  Pulse: 70 69 67 80  Resp: 18 17 18 18   Temp: 97.9 F (36.6 C) 99.3 F (37.4 C) 97.9 F (36.6 C) 97.7 F (36.5 C)  TempSrc: Oral Oral Oral Oral  SpO2: 100% 98% 97% 95%   Supplemental O2: Room Air SpO2: 95 %   Physical Exam:  Constitutional: well-appearing elderly woman lying in bed, in no acute distress, pleasant demeanor  HENT: normocephalic atraumatic, mucous membranes moist Eyes: conjunctiva non-erythematous Cardiovascular: regular rate and rhythm, no m/r/g Pulmonary/Chest: normal work of breathing on room air, lungs clear to auscultation bilaterally Abdominal: soft, tender on deep palpation of right upper quadrant, non-distended   Assessment/Plan:   Active Problems:   Acute hepatitis   Right upper quadrant pain   Patient Summary: Hospital day #2 for [name] [name] a 75 year old person living with CAD, CKD stage II, HTN, and T2DM who presents with acute hepatitis unknown cause.  #Acute hepatitis -Most likely etiology is medication related (Statin) with concomitant dehydration. Improving with IV fluids and holding BP meds.  -continue to hold statin - Anti-smooth muscle antibodies negative, mitochondrial antibodies negative, ANA pending - Hepatic enzymes are trending down, will recommend CMP at hospital follow up ~ 1 week to see if enzymes have normalized -Encourage p.o. intake -Follow-up TTE results  #AKI on CKD Estimated baseline to be around 1- 1.3, creatinine 1.48 today BUN nl.  Creatinine was elevated at 1.97 and  improved with IV fluids  #HTN Held  home medications benazepril, atenolol, Imdur, amlodipine.  SBP normal  #T2DM Morning CBG 175.  21 units of aspart used last 24 hours. -Increase Lantus to 18 units daily - SSI-M  #Anemia Mild normocytic anemia, no signs of acute blood loss.  I expect drop in Hgb is from IV fluids.  No further work-up at this time.  Diet: Carb-Modified IVF: None,None VTE: Enoxaparin Code: Full PT/OT recs: Home Health,youth sized RW, 3 and 1.  Dispo: Anticipated discharge to Home in 1 days pending results of TTE and additional lab workup  for acute hepatitis.   Tamsen Snider, MD PGY2 Internal Medicine [phone] Please contact the on call pager after 5 pm and on weekends at [phone].

## 2021-03-16 NOTE — Progress Notes (Signed)
  Echocardiogram 2D Echocardiogram has been performed.  [name] [name] 03/16/2021, 2:21 PM

## 2021-03-17 ENCOUNTER — Other Ambulatory Visit (HOSPITAL_COMMUNITY): Payer: Self-pay

## 2021-03-17 LAB — GLUCOSE, CAPILLARY
Glucose-Capillary: 278 mg/dL — ABNORMAL HIGH (ref 70–99)
Glucose-Capillary: 248 mg/dL — ABNORMAL HIGH (ref 70–99)
Glucose-Capillary: 232 mg/dL — ABNORMAL HIGH (ref 70–99)

## 2021-03-17 LAB — MRSA PCR SCREENING: MRSA by PCR: NEGATIVE

## 2021-03-17 LAB — ANA W/REFLEX IF POSITIVE: Anti Nuclear Antibody (ANA): NEGATIVE

## 2021-03-17 MED ORDER — METFORMIN HCL ER 500 MG PO TB24
500.0000 mg | ORAL_TABLET | Freq: Two times a day (BID) | ORAL | Status: DC
  Administered 2021-03-17: 500 mg via ORAL
  Filled 2021-03-17 (×2): qty 1

## 2021-03-17 MED ORDER — LANTUS SOLOSTAR 100 UNIT/ML ~~LOC~~ SOPN
25.0000 [IU] | PEN_INJECTOR | Freq: Every day | SUBCUTANEOUS | 2 refills | Status: DC
Start: 2021-03-17 — End: 2021-04-25
  Filled 2021-03-17: qty 9, 30d supply, fill #0

## 2021-03-17 MED ORDER — INSULIN GLARGINE 100 UNIT/ML ~~LOC~~ SOLN
5.0000 [IU] | Freq: Once | SUBCUTANEOUS | Status: AC
  Administered 2021-03-17: 5 [IU] via SUBCUTANEOUS
  Filled 2021-03-17: qty 0.05

## 2021-03-17 MED ORDER — LIRAGLUTIDE 18 MG/3ML ~~LOC~~ SOPN: 1.2000 mg | PEN_INJECTOR | Freq: Every day | SUBCUTANEOUS | Status: DC

## 2021-03-17 MED ORDER — INSULIN ASPART 100 UNIT/ML IJ SOLN
0.0000 [IU] | INTRAMUSCULAR | Status: DC
  Administered 2021-03-17 (×2): 7 [IU] via SUBCUTANEOUS

## 2021-03-17 MED ORDER — VICTOZA 18 MG/3ML ~~LOC~~ SOPN
1.2000 mg | PEN_INJECTOR | Freq: Every day | SUBCUTANEOUS | 2 refills | Status: DC
  Filled 2021-03-17: qty 6, 30d supply, fill #0

## 2021-03-17 MED ORDER — METFORMIN HCL ER 500 MG PO TB24
500.0000 mg | ORAL_TABLET | Freq: Two times a day (BID) | ORAL | 2 refills | Status: DC
  Filled 2021-03-17: qty 60, 30d supply, fill #0

## 2021-03-17 NOTE — Plan of Care (Signed)
  Problem: Health Behavior/Discharge Planning: Goal: Ability to manage health-related needs will improve Outcome: Progressing   

## 2021-03-17 NOTE — Discharge Instructions (Signed)
[name] [name], it was a pleasure getting to know you and take care of you. You were admitted to the hospital for elevated liver enzymes. These values improved with fluids and holding your cholesterol medication. Here are your discharge instructions: 1. Stop taking your benazepril, amlodipine, imdur, atenolol, jardiance, and rosuvastatin. 2. Take your lantus 25 units once daily, metformin 500 mg twice a day, and victoza 1.2 mg daily. 3. Continue taking all other medications. 4. Continue drinking lots of water to ensure you are hydrated. 5. Follow up with your PCP, Dr. Jimmye Norman, in one week to make sure you are continuing to improve.

## 2021-03-17 NOTE — Progress Notes (Signed)
DISCHARGE NOTE HOME Lacee Grey to be discharged Home per MD order. Discussed prescriptions and follow up appointments with the patient. Prescriptions given to patient; medication list explained in detail. Patient verbalized understanding.  Skin clean, dry and intact without evidence of skin break down, no evidence of skin tears noted. IV catheter discontinued intact. Site without signs and symptoms of complications. Dressing and pressure applied. Pt denies pain at the site currently. No complaints noted.  Patient free of lines, drains, and wounds.   An After Visit Summary (AVS) was printed and given to the patient. Patient escorted via wheelchair, and discharged home via private auto.  Arlyss Repress, RN

## 2021-03-17 NOTE — Evaluation (Signed)
Occupational Therapy Evaluation and Discharge Patient Details Name: [name] [name] MRN: [id number] DOB: 19-Dec-1945 Today's Date: 03/17/2021    History of Present Illness [name] [name] is a 75 y.o. female admitted for evaluation of acute heptitis;  with a PMH of CAD, CKD stage 2, HTN, and T2DM   Clinical Impression   This 75 yo female admitted with above presents to acute OT at a Mod I level using a SPC, no further OT needs, we will sign off.    Follow Up Recommendations  No OT follow up    Equipment Recommendations  3 in 1 bedside commode       Precautions / Restrictions Precautions Precautions: Fall Precaution Comments: Fall risk greatly reduced with SPC Restrictions Weight Bearing Restrictions: No      Mobility Bed Mobility Overal bed mobility: Independent             General bed mobility comments: Up from a flat bed, no rails    Transfers Overall transfer level: Modified independent Equipment used: Straight cane             General transfer comment: Adjusted her SPC to lowest position (still too high, but better). We talked about if she ever got a new cane how to determine the one that is the right height for her.    Balance Overall balance assessment: Mild deficits observed, not formally tested                                         ADL either performed or assessed with clinical judgement   ADL Overall ADL's : Modified independent                                       General ADL Comments: At [facility] level. She rents her home so we talked about her asking her [employer] to see if they would remove shower doors and then she could use a tub bench instead of a tub seat and thus would not have to step into tub.     Vision Patient Visual Report: No change from baseline              Pertinent Vitals/Pain Pain Assessment: No/denies pain     Hand Dominance Right   Extremity/Trunk Assessment Upper Extremity  Assessment Upper Extremity Assessment: Overall WFL for tasks assessed           Communication Communication Communication: No difficulties   Cognition Arousal/Alertness: Awake/alert Behavior During Therapy: WFL for tasks assessed/performed Overall Cognitive Status: Within Functional Limits for tasks assessed                                                Home Living Family/patient expects to be discharged to:: Private residence Living Arrangements: Non-relatives/Friends (roommate) Available Help at Discharge: Family;Friend(s);Available 24 hours/day Type of Home: House Home Access: Stairs to enter [employer] of Steps: 4-5 Entrance Stairs-Rails: Left Home Layout: One level     Bathroom Shower/Tub: Tub/shower unit;Door   [employer] Toilet: Standard     Home Equipment: Kasandra Knudsen - single point;Shower seat          Prior Functioning/Environment Level of Independence: Independent  with assistive device(s)        Comments: Uses walker for amb outside the home, cane or furniture walks inside        OT Problem List: Impaired balance (sitting and/or standing)         OT Goals(Current goals can be found in the care plan section) Acute Rehab OT Goals Patient Stated Goal: Hopes to go home today                AM-PAC OT "6 Clicks" Daily Activity     Outcome Measure Help from another person eating meals?: None Help from another person taking care of personal grooming?: None Help from another person toileting, which includes using toliet, bedpan, or urinal?: None Help from another person bathing (including washing, rinsing, drying)?: None Help from another person to put on and taking off regular upper body clothing?: None Help from another person to put on and taking off regular lower body clothing?: None 6 Click Score: 24   End of Session    Activity Tolerance: Patient tolerated treatment well Patient left: in chair;with call bell/phone  within reach  OT Visit Diagnosis: Muscle weakness (generalized) (M62.81)                Time: 8592-9244 OT Time Calculation (min): 15 min Charges:  OT General Charges $OT Visit: 1 Visit OT Evaluation $OT Eval Low Complexity: 1 Low  [place], OTR/L Acute [employer] Pager [phone] Office [phone]    Almon Register 03/17/2021, 11:41 AM

## 2021-03-17 NOTE — Progress Notes (Addendum)
Inpatient Diabetes Program Recommendations  AACE/ADA: New Consensus Statement on Inpatient Glycemic Control (2015)  Target Ranges:  Prepandial:   less than 140 mg/dL      Peak postprandial:   less than 180 mg/dL (1-2 hours)      Critically ill patients:  140 - 180 mg/dL   Lab Results  Component Value Date   GLUCAP 248 (H) 03/17/2021   HGBA1C 13.7 (A) 03/13/2021    Review of Glycemic Control Results for CARRA, BRINDLEY (MRN [id number]) as of 03/17/2021 09:44  Ref. Range 03/16/2021 21:15 03/17/2021 01:24 03/17/2021 08:12  Glucose-Capillary Latest Ref Range: 70 - 99 mg/dL 295 (H) 278 (H) 248 (H)   Diabetes history: Type 2 DM Outpatient Diabetes medications: Jardiance 10 mg QD, Victoza 1.8 mg QD, Metformin 500 mg BID Current orders for Inpatient glycemic control: Lantus 18 units QHS, Metformin 500 mg BID, Novolog 0-20 units Q4H  Inpatient Diabetes Program Recommendations:    Consider: -Changing correction to Novolog 0-20 units TID -Adding Novolog 0-5 units QHS -Further increasing Lantus to 25 units QHS -Adding Novolog 3 units TID (assuming patient is consuming >50% of meals)  Noted per chart review, PCP added Lantus 10 units QD on 03/13/21. Assuming weight loss related to poorly controlled diabetes.  Will plan to see today.   Spoke with patient regarding outpatient diabetes management. Patient was restarted on Lantus 10 units QD at PCP visit. Had been on insulin prior, however, was discontinued due to recurring hypoglycemia.   Reviewed patient's current A1c of 13.7%. Explained what a A1c is and what it measures. Also reviewed goal A1c with patient, importance of good glucose control @ home, and blood sugar goals. Reviewed patho of DM, need for insulin, role of pancreas, weight loss with poor glycemic control, survival skills and interventions, vascular and commorbidities.  Patient has a meter and checks CBGs 3 times per day. Reviewed when to call MD.  Patient admits to consuming sugary  beverages. Reviewed alternatives,  Plate method and encouraged CHO mindfulness. Has appointment with PCP and no furhte questions.   Thanks, Bronson Curb, MSN, RNC-OB Diabetes Coordinator [phone] (8a-5p)

## 2021-03-17 NOTE — Discharge Summary (Signed)
Name: [name] [name] MRN: [id number] DOB: 04/03/1946 75 y.o. PCP: Angelica Pou, MD  Date of Admission: 03/14/2021  4:16 PM Date of Discharge: 03/17/2021 Attending Physician: Sid Falcon, MD  Discharge Diagnosis: 1. Acute hepatitis 2. Acute kidney injury 3. Normocytic anemia  Discharge Medications: Allergies as of 03/17/2021   No Known Allergies     Medication List    STOP taking these medications   amLODipine 5 MG tablet Commonly known as: NORVASC   atenolol 100 MG tablet Commonly known as: TENORMIN   benazepril 40 MG tablet Commonly known as: LOTENSIN   furosemide 40 MG tablet Commonly known as: LASIX   isosorbide mononitrate 30 MG 24 hr tablet Commonly known as: IMDUR   Jardiance 10 MG Tabs tablet Generic drug: empagliflozin   nystatin cream Commonly known as: MYCOSTATIN   rosuvastatin 40 MG tablet Commonly known as: CRESTOR   traMADol 50 MG tablet Commonly known as: ULTRAM     TAKE these medications   Accu-Chek FastClix Lancets Misc USE THREE TIMES DAILY. 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 Dx E11.65   Accu-Chek Guide w/Device Kit 1 each by Does not apply route 3 (three) times daily.   aspirin 81 MG tablet Take 1 tablet (81 mg total) by mouth daily.   BD Pen Needle Nano U/F 32G X 4 MM Misc Generic drug: Insulin Pen Needle USE TO INJECT VICTOZA ONCE A DAY AND LANTUS ONCE A DAY AS DIRECTED   colestipol 1 g tablet Commonly known as: COLESTID Take 1-2 tablets (1-2 g total) by mouth 2 (two) times daily as needed. What changed: reasons to take this   Lantus SoloStar 100 UNIT/ML Solostar Pen Generic drug: insulin glargine Inject 25 Units into the skin at bedtime.   loratadine 10 MG tablet Commonly known as: CLARITIN TAKE 1 TABLET(10 MG) BY MOUTH DAILY What changed:   how much to take  how to take this  when to take  this  additional instructions   metFORMIN 500 MG 24 hr tablet Commonly known as: GLUCOPHAGE-XR Take 1 tablet (500 mg total) by mouth in the morning and at bedtime. What changed: how much to take   nitroGLYCERIN 0.4 MG SL tablet Commonly known as: NITROSTAT PLACE 1 TABLET UNDER THE TONGUE EVERY 5 MINUTES AS NEEDED FOR CHEST PAIN What changed:   how much to take  how to take this  when to take this  reasons to take this  additional instructions   pantoprazole 40 MG tablet Commonly known as: Protonix Take 1 tablet (40 mg total) by mouth daily.   PARoxetine 40 MG tablet Commonly known as: PAXIL Take 1 tablet (40 mg total) by mouth every morning.   pregabalin 150 MG capsule Commonly known as: LYRICA TAKE 1 CAPSULE(150 MG) BY MOUTH DAILY What changed:   how much to take  how to take this  when to take this  additional instructions   Victoza 18 MG/3ML Sopn Generic drug: liraglutide Inject 1.2 mg into the skin daily. ADMINISTER 1.2 MG UNDER THE SKIN DAILY What changed:   how much to take  how to take this  when to take this  additional instructions            Durable Medical Equipment  (From admission, onward)         Start     Ordered   03/17/21 1548  For home  use only DME Walker rolling  Once       Comments: Youth sized rolling walker  Question Answer Comment  Walker: With Celina Wheels   Patient needs a walker to treat with the following condition Impaired ambulation      03/16/21 1549   03/16/21 1550  For home use only DME 3 n 1  Once        03/16/21 1549         Disposition and follow-up:   Ms.Rosa Novalee Horsfall was discharged from [facility] in Good condition.  At the hospital follow up visit please address:  1. Acute hepatitis: trend hepatic enzymes back to normal values, assess abdominal pain  2. HTN: Held BP meds and patient normotensive. Check BP.  3. AKI: We estimated baseline 1-1.3, at discharge 1.48, down  from 1.9. Check kidney function .  4. T2DM: Stopped Jardiance. Reduced Victoza to 1.2 mg . Reduced Metformin XR to 500 mg BID. Started Lantus 25 units daily. Review patients home glucose monitoring.   5. HLD: Stopped statin in setting of hepatitis. Consider check Lipid panel in future.   6. Normocytic anemia: Hgb stable, mild anemia. Monitor as needed.   7.  Labs / imaging needed at time of follow-up: CMP, CBC  8.  Pending labs/ test needing follow-up: ANA  Follow-up Appointments:  Message sent to [facility] clinic , asking for patient to be [facility] Course by problem list: #Acute hepatitis Patient came as direct admit from [facility] with elevated LFTs. She reported no substance use or sexual activity. BMP demonstrated ALT 16x ULN at 542, AST 211, alkaline phosphatase 258, and normal total bilirubin of 0.5. RUQ [place] and CT chest, abdomen, pelvis were unremarkable. Hepatitis A/B/C serologies were negative. Salicylate levels, LDH, INR, and APTT were normal. Autoimmune labs ordered and were negative. ANA pending. Echo was unremarkable. LFTs continuously trended down over admission with IV LR and cessation of rosuvastatin. Will need follow up with PCP to assess further improvement of LFTs.  #Hypertension Patient's BP was soft on admission at 98/51. BP medications held. Will continue to hold BP meds at discharge due to soft BP at discharge of 100's /60's. Follow up with PCP for further management.  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 1.81, BUN 26, and eGFR 29. Benazepril, amlodipine, atenolol, and imdur held. After IVF, labs improved. Likely prerenal in nature due to medication-induced volume depletion. Follow up in clinic for further management of anti-hypertensive medications.  #T2DM BG on BMP in clinic was 339, Hgb A1c was 13.7. Victoza, jardiance held. Lantus 10 units and novolog ssi was given with suboptimal control of BG. Increased dose to lantus 18  units with novolog resistant ssi during remainder of stay. Will hold jardiance on discharge due to risk of fluid depletion. Resume lantus 20 units, metformin 500 BID, and victoza 1.2 mg at discharge. Follow up with PCP to further optimize regimen.  #HLD Patient was taking rosuvastatin 40 mg at home. It was held in hospital due to suspected statin-induced hepatotoxicity. Will continue to hold on discharge. Follow up with PCP to optimize therapy.  #Normocytic anemia Patient's CBC on admission demonstrated Hgb 11.2 and MCV 86.4, both decreased from clinic visit day before admission. No evidence of bleeding. Likely dilutional from IVF. It remained stable throughout admission. Recommend follow up with PCP.  Discharge Exam:   BP 120/64 (BP Location: Left Arm)  Pulse 72   Temp 97.6 F (36.4 C) (Oral)   Resp 17   SpO2 94%   GEN: Well developed, well-nourished, in NAD CVS: RRR, normal S1/S2, no murmurs, rubs, gallops RESP: Breathing comfortably on RA, no retractions, wheezes, rhonchi, or crackles ABD: Soft, non-tender, no organomegaly or masses SKIN: No lesions or rashes  EXT: Moves all extremities equally   Pertinent Labs, Studies, and Procedures:  CBC Latest Ref Rng & Units 03/16/2021 03/15/2021 03/14/2021  WBC 4.0 - 10.5 K/uL 5.6 5.7 6.2  Hemoglobin 12.0 - 15.0 g/dL 10.6(L) 11.4(L) 11.2(L)  Hematocrit 36.0 - 46.0 % 30.6(L) 32.8(L) 33.1(L)  Platelets 150 - 400 K/uL 191 199 210   CMP Latest Ref Rng & Units 03/16/2021 03/15/2021 03/14/2021  Glucose 70 - 99 mg/dL 233(H) 310(H) 418(H)  BUN 8 - 23 mg/dL 21 24(H) 28(H)  Creatinine 0.44 - 1.00 mg/dL 1.48(H) 1.64(H) 1.97(H)  Sodium 135 - 145 mmol/L 139 138 130(L)  Potassium 3.5 - 5.1 mmol/L 4.2 4.0 4.1  Chloride 98 - 111 mmol/L 106 104 93(L)  CO2 22 - 32 mmol/L '30 26 25  ' Calcium 8.9 - 10.3 mg/dL 9.2 9.4 8.9  Total Protein 6.5 - 8.1 g/dL 6.0(L) 6.5 7.0  Total Bilirubin 0.3 - 1.2 mg/dL 0.5 0.8 0.8  Alkaline Phos 38 - 126 U/L 155(H) 179(H) 212(H)  AST  15 - 41 U/L 78(H) 68(H) 103(H)  ALT 0 - 44 U/L 210(H) 259(H) 321(H)   GGT, acetaminophen, hepatitis ABC, LDH all normal.  RUQ US IMPRESSION: Status post cholecystectomy.  Otherwise unremarkable examination.  CT CHEST ABD PELVIS WO CONTRAST IMPRESSION: 1. No acute findings on noncontrast CT chest abdomen pelvis. 2. Normal liver on noncontrast exam.  Postcholecystectomy. 3. Small hiatal hernia. 4. Coronary artery calcification and Aortic Atherosclerosis  ECHO FINDINGS: 1. LVEF 55-60%, LV normal. 2. Mild mitral valve regurgitation. 3. Mild tricuspid valve regurgitation. 4. Mild aortic regurgitation, mild valve annular calcification.  Discharge Instructions: Discharge Instructions    Call MD for:   Complete by: As directed    Call MD for:  extreme fatigue   Complete by: As directed    Call MD for:  persistant dizziness or light-headedness   Complete by: As directed    Call MD for:  persistant nausea and vomiting   Complete by: As directed    Call MD for:  severe uncontrolled pain   Complete by: As directed    Increase activity slowly   Complete by: As directed     Ms. Pohl, it was a pleasure getting to know you and take care of you. You were admitted to the hospital for elevated liver enzymes. These values improved with fluids and holding your cholesterol medication. Here are your discharge instructions: 1. Stop taking your benazepril, amlodipine, imdur, atenolol, jardiance, and rosuvastatin. 2. Take your lantus 25 units once daily, metformin 500 mg twice a day, and victoza 1.2 mg daily. 3. Continue taking all other medications. 4. Continue drinking lots of water to ensure you are hydrated. 5. Follow up with your PCP, Dr. Jimmye Norman, in one week to make sure you are continuing to improve.  Signed: Mikal Plane, MS3 03/17/2021, 12:42 PM   Pager: [phone] After 5pm on weekdays and 1pm on weekends: On Call pager [phone]  Attestation for Student Documentation:  I  personally was present and performed or re-performed the history, physical exam and medical decision-making activities of this service and have verified that the service and findings are accurately documented in the student's  Barton Fanny, MD 03/17/2021, 12:54 PM

## 2021-03-18 ENCOUNTER — Ambulatory Visit
Admission: RE | Admit: 2021-03-18 | Discharge: 2021-03-18 | Disposition: A | Discharge status: Home / Self Care | Payer: 59 | Source: Ambulatory Visit | Attending: Sports Medicine | Admitting: Sports Medicine

## 2021-03-18 ENCOUNTER — Other Ambulatory Visit: Payer: Self-pay

## 2021-03-18 DIAGNOSIS — R918 Other nonspecific abnormal finding of lung field: Secondary | ICD-10-CM

## 2021-03-19 ENCOUNTER — Other Ambulatory Visit: Payer: Self-pay

## 2021-03-19 MED ORDER — NITROGLYCERIN 0.4 MG SL SUBL: SUBLINGUAL_TABLET | SUBLINGUAL | 0 refills | Status: DC

## 2021-03-19 NOTE — Telephone Encounter (Signed)
Returned call to patient. States she doesn't have all the meds listed on d/c AVS. Went down med list with patient. Only med she does not have is NTG. Explained this is only as needed for CP. Will request Rx for this be sent to [facility] on [address].

## 2021-03-19 NOTE — Telephone Encounter (Signed)
Requesting to speak with a nurse about meds. Please call pt back.  

## 2021-04-17 ENCOUNTER — Encounter: Payer: Self-pay | Admitting: Internal Medicine

## 2021-04-17 ENCOUNTER — Ambulatory Visit (INDEPENDENT_AMBULATORY_CARE_PROVIDER_SITE_OTHER): Payer: 59 | Admitting: Internal Medicine

## 2021-04-17 ENCOUNTER — Other Ambulatory Visit: Payer: Self-pay

## 2021-04-17 VITALS — BP 148/76 | HR 103 | Temp 98.5°F | Ht 59.0 in | Wt 198.7 lb

## 2021-04-17 DIAGNOSIS — I5032 Chronic diastolic (congestive) heart failure: Secondary | ICD-10-CM

## 2021-04-17 DIAGNOSIS — E7841 Elevated Lipoprotein(a): Secondary | ICD-10-CM

## 2021-04-17 DIAGNOSIS — B179 Acute viral hepatitis, unspecified: Secondary | ICD-10-CM

## 2021-04-17 DIAGNOSIS — E538 Deficiency of other specified B group vitamins: Secondary | ICD-10-CM

## 2021-04-17 DIAGNOSIS — E1122 Type 2 diabetes mellitus with diabetic chronic kidney disease: Secondary | ICD-10-CM

## 2021-04-17 DIAGNOSIS — Z8639 Personal history of other endocrine, nutritional and metabolic disease: Secondary | ICD-10-CM

## 2021-04-17 DIAGNOSIS — N182 Chronic kidney disease, stage 2 (mild): Secondary | ICD-10-CM

## 2021-04-17 DIAGNOSIS — E118 Type 2 diabetes mellitus with unspecified complications: Secondary | ICD-10-CM

## 2021-04-17 DIAGNOSIS — K9089 Other intestinal malabsorption: Secondary | ICD-10-CM

## 2021-04-17 DIAGNOSIS — I10 Essential (primary) hypertension: Secondary | ICD-10-CM

## 2021-04-17 DIAGNOSIS — I110 Hypertensive heart disease with heart failure: Secondary | ICD-10-CM | POA: Diagnosis not present

## 2021-04-17 DIAGNOSIS — F325 Major depressive disorder, single episode, in full remission: Secondary | ICD-10-CM

## 2021-04-17 DIAGNOSIS — R2243 Localized swelling, mass and lump, lower limb, bilateral: Secondary | ICD-10-CM | POA: Insufficient documentation

## 2021-04-17 DIAGNOSIS — R234 Changes in skin texture: Secondary | ICD-10-CM | POA: Insufficient documentation

## 2021-04-17 DIAGNOSIS — G252 Other specified forms of tremor: Secondary | ICD-10-CM

## 2021-04-17 DIAGNOSIS — J069 Acute upper respiratory infection, unspecified: Secondary | ICD-10-CM | POA: Insufficient documentation

## 2021-04-17 NOTE — Assessment & Plan Note (Signed)
985 last visit

## 2021-04-17 NOTE — Progress Notes (Signed)
[name] [name] presents for hospital follow-up, admitted briefly in early June for evaluation of elevated hepatic enzymes.  She was asymptomatic.  Acute hepatitis, nonspecific, diagnosed  - we will be rechecking today.  Statin stopped.  ANA drawn, pending at discharge.  Alk phos at time of discharge improved at 155 from 212-AST improved 10 3-78, ALT improved 3 21-2 10.  Acetaminophen levels, hepatitis panel, and LDH all normal.  Right upper quadrant ultrasound notable for history of cholecystectomy otherwise unremarkable.  2D echocardiogram performed presumably to look for right-sided failure causing hepatic congestion as a possible explanation for her findings.  History of hypertension with relative hypotension on her current regimen which was held until recheck today.  BP on date of discharge without medication was 120/64, HR 72.  Prerenal AKI to be reevaluated today.  CR at discharge 1.48, improved from 1.97.  Type 2 diabetes mellitus-medicines are significantly adjusted.  She was asked to stop Jardiance (I do not recall that she was on this; concern was about fluid loss), reduce Victoza to 1.2 mg daily (she is still taking the 1.8).  Reduce metformin XR to 500 mg twice daily.  Lantus beginning at 25 units daily.  She has been running quite hyperglycemic prior to this admission. Feels like blood sugars are much better.  Developed cold in chest on 04/14/21; a bit dyspneic, no fever or chills. No sick exposures.  She is feeling better and is not concerned.   Taking cough drops.    In past two weeks, noticed shaking with writing. Happens with both hands, only with intention/tasks.  "Zigzag" writing. Glucose not low during those spells.  Still losing weight with trulicity.  Points our bumps on legs, tender (these are chronic x years).  Since hospital, has also noted a bump in her lower left abdominal skin.       BP (!) 148/76 (BP Location: Right Arm, Patient Position: Sitting, Cuff Size: Small)   Pulse  (!) 103   Temp 98.5 F (36.9 C) (Oral)   Ht _0  (1.499 m)   Wt 198 lb 11.2 oz (90.1 kg)   SpO2 100%   BMI 40.13 kg/m   Soft high pitched early systolic murmur, suggestion of a gallop (no correlation on recent echo).   Lungs clear, muscles tender across upper back and shoulders, tender neck muscles. No cough during visit. Voice w/o hoarseness.  LLQ panus with area of subcutaneous thickening.  Not tender, no skin changes or discoloration.   Bother lower lateral legs with a single subdermal soft mildly tender easily compressible nodule without pore.  Not c/w erythema nodosa.  Hands with no tremor at rest or with intention today.   Assessment and plan: (Also see documentation in problem list)  [name] [name] is doing much better now that her blood sugars are better controlled.  Anticipate resuming an antihypertensive medication if allowed by renal function for pending lab today.

## 2021-04-17 NOTE — Patient Instructions (Signed)
[name] [name], it was a pleasure to see you today!  We discussed many things today:  Today blood pressure is 148/76, without any blood pressure medication.  I will call you with further instructions for which medicine to restart, after receiving the results from today's blood work.  Your blood sugars are doing great!  Continue your current regimen, and we will recheck your A1c at next visit.  I am glad your cold symptoms are getting better.  You may get Robitussin over-the-counter if your cough is bothersome.  In June, your blood test showed that your vitamin D level is a bit low.  Please get some vitamin D supplements over-the-counter.  You should be taking 1000 to 2000 units a day most days of the week for strong bones.  We will recheck your liver test today  I will see you in about 2 months!  Do not hesitate to make an appointment earlier if needed.  Take care and stay well! Dr. Jimmye Norman

## 2021-04-18 LAB — CMP14 + ANION GAP
ALT: 15 IU/L (ref 0–32)
AST: 19 IU/L (ref 0–40)
Albumin/Globulin Ratio: 1.7 (ref 1.2–2.2)
Albumin: 4 g/dL (ref 3.7–4.7)
Alkaline Phosphatase: 114 IU/L (ref 44–121)
Anion Gap: 17 mmol/L (ref 10.0–18.0)
BUN/Creatinine Ratio: 19 (ref 12–28)
BUN: 19 mg/dL (ref 8–27)
Bilirubin Total: 0.3 mg/dL (ref 0.0–1.2)
CO2: 23 mmol/L (ref 20–29)
Calcium: 9 mg/dL (ref 8.7–10.3)
Chloride: 99 mmol/L (ref 96–106)
Creatinine, Ser: 1.02 mg/dL — ABNORMAL HIGH (ref 0.57–1.00)
Globulin, Total: 2.4 g/dL (ref 1.5–4.5)
Glucose: 196 mg/dL — ABNORMAL HIGH (ref 65–99)
Potassium: 4.1 mmol/L (ref 3.5–5.2)
Sodium: 139 mmol/L (ref 134–144)
Total Protein: 6.4 g/dL (ref 6.0–8.5)
eGFR: 57 mL/min/{1.73_m2} — ABNORMAL LOW (ref >59)

## 2021-04-22 ENCOUNTER — Telehealth: Payer: Self-pay | Admitting: Dietician

## 2021-04-22 NOTE — Telephone Encounter (Signed)
[name] says she is getting her testing supplies from [facility] on [address]. Discard all faxes from this company.

## 2021-04-24 ENCOUNTER — Telehealth: Payer: Self-pay | Admitting: Internal Medicine

## 2021-04-24 ENCOUNTER — Other Ambulatory Visit: Payer: Self-pay | Admitting: Internal Medicine

## 2021-04-24 DIAGNOSIS — I10 Essential (primary) hypertension: Secondary | ICD-10-CM

## 2021-04-24 MED ORDER — LISINOPRIL 20 MG PO TABS: 20.0000 mg | ORAL_TABLET | Freq: Every day | ORAL | 11 refills | Status: DC

## 2021-04-24 NOTE — Assessment & Plan Note (Addendum)
Renal function today to be checked in the absence of antihypertensives and in the absence of Lasix.  Anticipate improvement. Recheck alb/cr

## 2021-04-24 NOTE — Assessment & Plan Note (Signed)
Statin held due to elevated liver enzymes.  I will readdress once her liver function has recovered

## 2021-04-24 NOTE — Assessment & Plan Note (Signed)
Well compensated.  No edema following cessation of Lasix.  Monitor.

## 2021-04-24 NOTE — Telephone Encounter (Signed)
Spoke to [name] [name] by phone to inform her of her recent lab results and to notify her of new prescription for lisinopril 20 mg. She is in agreement.

## 2021-04-24 NOTE — Assessment & Plan Note (Signed)
Blood sugar much better controlled on her new regimen including Lantus 25 units daily, metformin XR 500 mg twice daily, and Victoza is being reduced to 1.2 mg daily (she had been on 1.8).  Monitor.

## 2021-04-24 NOTE — Assessment & Plan Note (Signed)
Continues to have loose bowel movements which are well controlled with Colestid when she does take it.  Monitor.

## 2021-04-24 NOTE — Assessment & Plan Note (Signed)
Symptoms are currently well controlled.  She endorses no sadness or anhedonia.  Continue treatment.

## 2021-04-24 NOTE — Assessment & Plan Note (Signed)
BP medication stopped during hospitalization.  Currently 148/76.  Check BMP today and plan on resuming at least one of her antihypertensives.

## 2021-04-24 NOTE — Assessment & Plan Note (Signed)
Asymptomatic elevated liver enzymes recently noted for which she was hospitalized for short period.  Statin stopped.  Repeat labs today.

## 2021-04-25 ENCOUNTER — Other Ambulatory Visit: Payer: Self-pay

## 2021-04-25 DIAGNOSIS — IMO0002 Reserved for concepts with insufficient information to code with codable children: Secondary | ICD-10-CM

## 2021-04-25 DIAGNOSIS — E1151 Type 2 diabetes mellitus with diabetic peripheral angiopathy without gangrene: Secondary | ICD-10-CM

## 2021-04-25 MED ORDER — LANTUS SOLOSTAR 100 UNIT/ML ~~LOC~~ SOPN: 25.0000 [IU] | PEN_INJECTOR | Freq: Every day | SUBCUTANEOUS | 5 refills | Status: DC

## 2021-04-25 MED ORDER — BD PEN NEEDLE NANO U/F 32G X 4 MM MISC: 5 refills | Status: DC

## 2021-04-25 NOTE — Telephone Encounter (Signed)
Insulin Pen Needle (BD PEN NEEDLE NANO U/F) 32G X 4 MM MISC  insulin glargine (LANTUS SOLOSTAR) 100 UNIT/ML Solostar Pen, refill request @ [facility] [phone] - [place], [place] [address]

## 2021-05-14 ENCOUNTER — Telehealth: Payer: Self-pay

## 2021-05-14 NOTE — Telephone Encounter (Signed)
Pt is requesting her pregabalin (LYRICA) 150 MG capsule sent to  [id number] - [place], [place] AT [address] Phone:  [phone]  Fax:  [phone]

## 2021-05-14 NOTE — Telephone Encounter (Signed)
#  90 with 3 refills sent 09/13/2020. Patient notified to contact [facility] for refill.

## 2021-05-15 ENCOUNTER — Other Ambulatory Visit: Payer: Self-pay | Admitting: *Deleted

## 2021-05-15 MED ORDER — PANTOPRAZOLE SODIUM 40 MG PO TBEC: 40.0000 mg | DELAYED_RELEASE_TABLET | Freq: Every day | ORAL | 2 refills | Status: DC

## 2021-06-12 ENCOUNTER — Other Ambulatory Visit: Payer: Self-pay | Admitting: *Deleted

## 2021-06-12 MED ORDER — METFORMIN HCL ER 500 MG PO TB24: 500.0000 mg | ORAL_TABLET | Freq: Two times a day (BID) | ORAL | 2 refills | Status: DC

## 2021-06-12 NOTE — Telephone Encounter (Signed)
Next appt scheduled 06/19/21 with PCP.

## 2021-06-13 ENCOUNTER — Other Ambulatory Visit: Payer: Self-pay

## 2021-06-13 ENCOUNTER — Encounter: Payer: Self-pay | Admitting: Podiatry

## 2021-06-13 ENCOUNTER — Ambulatory Visit (INDEPENDENT_AMBULATORY_CARE_PROVIDER_SITE_OTHER): Payer: 59 | Admitting: Podiatry

## 2021-06-13 DIAGNOSIS — M79675 Pain in left toe(s): Secondary | ICD-10-CM | POA: Diagnosis not present

## 2021-06-13 DIAGNOSIS — E1142 Type 2 diabetes mellitus with diabetic polyneuropathy: Secondary | ICD-10-CM

## 2021-06-13 DIAGNOSIS — B351 Tinea unguium: Secondary | ICD-10-CM

## 2021-06-13 DIAGNOSIS — L84 Corns and callosities: Secondary | ICD-10-CM

## 2021-06-13 DIAGNOSIS — M79674 Pain in right toe(s): Secondary | ICD-10-CM | POA: Diagnosis not present

## 2021-06-17 NOTE — Progress Notes (Signed)
  Subjective:  Patient ID: [name] [name], female    DOB: 25-Mar-1946,  MRN: [id number]  Nieve Stitely presents to clinic today for at risk foot care with history of diabetic neuropathy and callus(es) b/l feet and painful thick toenails that are difficult to trim. Painful toenails interfere with ambulation. Aggravating factors include wearing enclosed shoe gear. Pain is relieved with periodic professional debridement. Painful calluses are aggravated when weightbearing with and without shoegear. Pain is relieved with periodic professional debridement.   She states her blood glucose was 109 mg/dl today. She relates no new pedal problems on today's visit.  No Known Allergies  Review of Systems: Negative except as noted in the HPI. Objective:   Constitutional Emelinda Marble is a pleasant 75 y.o. African American female, in NAD. AAO x 3.   Vascular Capillary refill time to digits immediate b/l. Palpable DP pulse(s) b/l lower extremities Nonpalpable PT pulse(s) b/l lower extremities. Pedal hair sparse. Lower extremity skin temperature gradient within normal limits. No pain with calf compression b/l. No cyanosis or clubbing noted.  Neurologic Normal speech. Oriented to person, place, and time. Protective sensation diminished with 10g monofilament b/l. Vibratory sensation intact b/l.  Dermatologic Pedal skin with normal turgor, texture and tone bilaterally. No open wounds bilaterally. No interdigital macerations bilaterally. Toenails 1-5 b/l elongated, discolored, dystrophic, thickened, crumbly with subungual debris and tenderness to dorsal palpation. Hyperkeratotic lesion(s) submet head 1 left foot, submet head 1 right foot, submet head 5 left foot and submet head 5 right foot.  No erythema, no edema, no drainage, no fluctuance.  Orthopedic: Normal muscle strength 5/5 to all lower extremity muscle groups bilaterally. 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 No pain crepitus or joint limitation noted with ROM  b/l.   Radiographs: None Assessment:   1. Pain due to onychomycosis of toenails of both feet   2. Callus   3. Diabetic peripheral neuropathy associated with type 2 diabetes mellitus ([place])     Plan:  -No new findings. No new orders. -Continue diabetic foot care principles: inspect feet daily, monitor glucose as recommended by PCP and/or Endocrinologist, and follow prescribed diet per PCP, Endocrinologist and/or dietician. -Patient to continue soft, supportive shoe gear daily. -Toenails 1-5 b/l were debrided in length and girth with sterile nail nippers and dremel without iatrogenic bleeding.  -Callus(es) submet head 1 left foot, submet head 1 right foot, submet head 5 left foot, and submet head 5 right foot pared utilizing sterile scalpel blade without complication or incident. Total number debrided =4. -Patient to report any pedal injuries to medical professional immediately. -Patient/POA to call should there be question/concern in the interim.  Return in about 3 months (around 09/12/2021).  Marzetta Board, DPM

## 2021-06-19 ENCOUNTER — Ambulatory Visit (INDEPENDENT_AMBULATORY_CARE_PROVIDER_SITE_OTHER): Payer: 59 | Admitting: Internal Medicine

## 2021-06-19 ENCOUNTER — Other Ambulatory Visit: Payer: Self-pay | Admitting: *Deleted

## 2021-06-19 VITALS — BP 152/72 | HR 100 | Temp 98.2°F | Ht 59.0 in | Wt 201.4 lb

## 2021-06-19 DIAGNOSIS — R2681 Unsteadiness on feet: Secondary | ICD-10-CM

## 2021-06-19 DIAGNOSIS — M549 Dorsalgia, unspecified: Secondary | ICD-10-CM

## 2021-06-19 DIAGNOSIS — M542 Cervicalgia: Secondary | ICD-10-CM

## 2021-06-19 DIAGNOSIS — Z794 Long term (current) use of insulin: Secondary | ICD-10-CM

## 2021-06-19 DIAGNOSIS — I10 Essential (primary) hypertension: Secondary | ICD-10-CM

## 2021-06-19 DIAGNOSIS — E1165 Type 2 diabetes mellitus with hyperglycemia: Secondary | ICD-10-CM

## 2021-06-19 DIAGNOSIS — G8929 Other chronic pain: Secondary | ICD-10-CM

## 2021-06-19 DIAGNOSIS — G252 Other specified forms of tremor: Secondary | ICD-10-CM

## 2021-06-19 DIAGNOSIS — E559 Vitamin D deficiency, unspecified: Secondary | ICD-10-CM

## 2021-06-19 DIAGNOSIS — K9089 Other intestinal malabsorption: Secondary | ICD-10-CM

## 2021-06-19 DIAGNOSIS — B179 Acute viral hepatitis, unspecified: Secondary | ICD-10-CM

## 2021-06-19 DIAGNOSIS — Z23 Encounter for immunization: Secondary | ICD-10-CM

## 2021-06-19 DIAGNOSIS — E66812 Obesity, class 2: Secondary | ICD-10-CM

## 2021-06-19 DIAGNOSIS — I5032 Chronic diastolic (congestive) heart failure: Secondary | ICD-10-CM

## 2021-06-19 DIAGNOSIS — E669 Obesity, unspecified: Secondary | ICD-10-CM

## 2021-06-19 DIAGNOSIS — N907 Vulvar cyst: Secondary | ICD-10-CM

## 2021-06-19 DIAGNOSIS — E1140 Type 2 diabetes mellitus with diabetic neuropathy, unspecified: Secondary | ICD-10-CM

## 2021-06-19 DIAGNOSIS — E7841 Elevated Lipoprotein(a): Secondary | ICD-10-CM

## 2021-06-19 DIAGNOSIS — F325 Major depressive disorder, single episode, in full remission: Secondary | ICD-10-CM

## 2021-06-19 DIAGNOSIS — E118 Type 2 diabetes mellitus with unspecified complications: Secondary | ICD-10-CM | POA: Diagnosis not present

## 2021-06-19 DIAGNOSIS — I110 Hypertensive heart disease with heart failure: Secondary | ICD-10-CM

## 2021-06-19 DIAGNOSIS — IMO0002 Reserved for concepts with insufficient information to code with codable children: Secondary | ICD-10-CM

## 2021-06-19 DIAGNOSIS — R911 Solitary pulmonary nodule: Secondary | ICD-10-CM

## 2021-06-19 DIAGNOSIS — E1142 Type 2 diabetes mellitus with diabetic polyneuropathy: Secondary | ICD-10-CM

## 2021-06-19 LAB — POCT GLYCOSYLATED HEMOGLOBIN (HGB A1C): Hemoglobin A1C: 7.2 % — AB (ref 4.0–5.6)

## 2021-06-19 LAB — GLUCOSE, CAPILLARY: Glucose-Capillary: 142 mg/dL — ABNORMAL HIGH (ref 70–99)

## 2021-06-19 MED ORDER — ACCU-CHEK GUIDE VI STRP: ORAL_STRIP | 5 refills | Status: DC

## 2021-06-19 NOTE — Progress Notes (Signed)
Routine f/u of chronic conditions including DM2 ("it's doing pretty good I think"), HTN ("I'm taking my medicines, I don't have any problems"), obesity ("I think I'm losing with this Trulicity"), and chronic pain.    Since last visit she reports that she has been doing fairly well though wants to report neck pain, chronic, uncomfortable, making it difficult to get to sleep. Also experiencing numbness and coldness of feet night and day.  The chronic 'bumps' on her lower legs continue to be annoying and uncomfortable with pressure (as when pressed or bumped against something).  BP (!) 152/72 (BP Location: Right Arm, Patient Position: Sitting, Cuff Size: Small)   Pulse 100   Temp 98.2 F (36.8 C) (Oral)   Ht '4\' 11"'$  (1.499 m)   Wt 201 lb 6.4 oz (91.4 kg)   SpO2 100%   BMI 40.68 kg/m   Neck exam: paracervical spine muscles tender bilaterally to occiput and extending down into trapeziae.  ROM is mildly reduced in all planes.  Shoulder shrug exacerbates neck discomfort.  No radiating discomfort with downward head/axial pressure (neg Spurlings).  Unchanged from prior exams.  Bumps on lateral lower legs hurt with pressure, palpation.  Unchanged from prior exams, noted in problem list.  No edema despite cession of lasix in 04/2021.    Assessment and plan:  Weight gain, after reducing dose of liragluted for rapid weight loss experienced earlier.  Increase liraglutide? Much improved DM2 (A1c 7.2 today down from 13.7 03/2021) since 6-04/2021 when lantus resumed and adjusted,  metformin decreased.  No changes today.  Increase Victoza to 1.8 mg daily  *The patient is currently using Continuous Glucose Monitoring. The patient is injecting insulin 25 times a day. The patient is making adjustments to their diabetes regimen based on glucose readings.  Uncontrolled HTN following a number of med changes during a 04/2021 hospitalization during which she was overly treated (hospitalization for unrelated problem).   Lisinopril 20 mg added back 04/24/21.  Today increase amlodipine from 5 mg daily to 10 mg daily.  Had not done this at time of call.  Then increase lisinopril from 20 to 40.  Note she has been prescribed atenolol for at least 10 years; presumed for HTN?  Neck pain chronic, PT? Xrays of neck, back, shoulders completed 02/2021 as ordered by DR. Draper.  Tramadol?  Worsening neuropathy.  At night feet are cold and stinging.  Takes tramadol in evening (ran out and not refilled in error).  Change Lyrica from morning to evening as well. SHe agrees.    Deficiency vit D, needs daily supp I.e. 1000-2000 units daily. Prefers capsules.    Chronic neck and back pain - depo injection at [facility] med 02/20/21 was not helpful.  PT last year helped for a short time.  Pain has increased since tramadol wasn't refilled in error.  Will restart.   Addendum:  Called to speak to Ms. Dazey 07/02/21 to review meds and problems.  She had not yet begun to take 2 of the 5 mg amlodipine - reminded her to do so now. She wishes to increase Victoza back to 1.8 mg daily for the weight loss benefit.  Done. Will begin taking the Lyrica in the evening for her foot neuropathy which is worse at night. Will restart the tramadol which has done well in the past at daily/twice daily as needed dosing for her chronic pain.   Vit D 1000 IU prescribed as gummies.  The capsules, which she preferred, were not covered by  [employer].

## 2021-06-23 ENCOUNTER — Telehealth: Payer: Self-pay | Admitting: *Deleted

## 2021-06-23 NOTE — Telephone Encounter (Signed)
Pt recently seen by pcp States was to get prescription for her diarrhea sent to pharmacy as discussed during visit Will send to pcp for review,  please advise.[name]9/12/20224:33 PM

## 2021-06-24 ENCOUNTER — Other Ambulatory Visit: Payer: Self-pay | Admitting: Internal Medicine

## 2021-06-24 DIAGNOSIS — K9089 Other intestinal malabsorption: Secondary | ICD-10-CM

## 2021-06-24 MED ORDER — COLESTIPOL HCL 1 G PO TABS: 1.0000 g | ORAL_TABLET | Freq: Two times a day (BID) | ORAL | 3 refills | Status: DC | PRN

## 2021-06-27 MED ORDER — PREGABALIN 150 MG PO CAPS: ORAL_CAPSULE | ORAL | 3 refills | Status: DC

## 2021-06-27 NOTE — Assessment & Plan Note (Addendum)
Uncontrolled.  In process of reintroducing antihypertensives stopped (hypotension) during hospialization 03/2021.  LIsinopril 20 restarted 04/2021.  On amlodipine 5 mg, taking one but today increased to 2 pills (10 mg) once daily until new prescription for 10 mg is needed. Increase in ACEI may be attempted but close f/u of renal fxn would be necessary.

## 2021-06-27 NOTE — Assessment & Plan Note (Signed)
Not present on exam, not as bothersome lately.  Monitor.

## 2021-06-27 NOTE — Assessment & Plan Note (Signed)
Chest CT 03/2021 performed during hospitalization for unrelated problem identified no nodule.

## 2021-06-27 NOTE — Assessment & Plan Note (Addendum)
Prescribed a walker during hospitalization 03/2021.  No falls have occurred despite high risk.

## 2021-06-27 NOTE — Assessment & Plan Note (Signed)
Symptoms well controlled on long-term Paxil, ordinarily avoided in older persons due to anticholinergic SEs though she is tolerating well at this time.  Monitor.

## 2021-06-27 NOTE — Assessment & Plan Note (Signed)
Not addressed today, though liver enzymes had normalized in 04/2021.

## 2021-06-27 NOTE — Assessment & Plan Note (Addendum)
Today A1C 7.2, improved from 13.7 in 03/2021.   Very recently metformin was increased from 500 daily to bid, with room for further increase. Victoza decreased from 1.8 to 1.2 during 04/2021 hospitalization, and weight gain has since been noted.    The patient is currently using Continuous Glucose Monitoring. The patient is injecting insulin 25 times a day. The patient is making adjustments to their diabetes regimen based on glucose readings. I have reviewed her glucose readings.  No changes made today, though anticipate increasing liraglutide back to 1.8 as it had been previously.  Addendum: phone call to discuss care - she wishes to increase the victoza back to 1.8 mg daily for weight loss benefits. Done.

## 2021-07-01 ENCOUNTER — Encounter: Payer: Self-pay | Admitting: Internal Medicine

## 2021-07-01 NOTE — Assessment & Plan Note (Signed)
All elevated liver enzymes had normalized on recheck at hospital f/u.  SHe remains off statin.

## 2021-07-01 NOTE — Assessment & Plan Note (Signed)
Colestid remains beneficial. Diarrhea returns in its absence.

## 2021-07-01 NOTE — Assessment & Plan Note (Signed)
Remains off lasix with no return of edema or of dyspnea.  Monitor.

## 2021-07-02 ENCOUNTER — Telehealth: Payer: Self-pay

## 2021-07-02 MED ORDER — VICTOZA 18 MG/3ML ~~LOC~~ SOPN: 1.8000 mg | PEN_INJECTOR | Freq: Every day | SUBCUTANEOUS | 2 refills | Status: DC

## 2021-07-02 MED ORDER — LISINOPRIL 20 MG PO TABS: 20.0000 mg | ORAL_TABLET | Freq: Every day | ORAL | 3 refills | Status: DC

## 2021-07-02 MED ORDER — PAROXETINE HCL 40 MG PO TABS: 40.0000 mg | ORAL_TABLET | Freq: Every morning | ORAL | 3 refills | Status: DC

## 2021-07-02 MED ORDER — PREGABALIN 150 MG PO CAPS: 150.0000 mg | ORAL_CAPSULE | Freq: Every evening | ORAL | 3 refills | Status: DC

## 2021-07-02 MED ORDER — ULTRAM 50 MG PO TABS: 50.0000 mg | ORAL_TABLET | Freq: Two times a day (BID) | ORAL | 0 refills | Status: DC | PRN

## 2021-07-02 MED ORDER — VITAMIN D3 GUMMIES 25 MCG (1000 UT) PO CHEW: 1000.0000 [IU] | CHEWABLE_TABLET | Freq: Every day | ORAL | 3 refills | Status: DC

## 2021-07-02 NOTE — Telephone Encounter (Signed)
Pa was through cover my meds for pt ULTRAM 50 MG TABS awaiting approval or denial     NOTE:  [employer] is reviewing your PA request. Typically an electronic response will be received within 24-72 hours. To check for an update later

## 2021-07-02 NOTE — Assessment & Plan Note (Signed)
Victoza will be increased back to 1.8 mg for weight loss benefits (dose was reduced during hospitalization 03/2021 for unrelated problem).

## 2021-07-02 NOTE — Assessment & Plan Note (Signed)
Vit D 1000 units prescribed - she prefers capsules which are not covered by [facility].  Gummies ordered.

## 2021-07-02 NOTE — Assessment & Plan Note (Signed)
Worsening "cold numb and painful feet at night".  Will begin taking Lyrica in evening rather than morning rather than increasing total daily dose.

## 2021-07-02 NOTE — Assessment & Plan Note (Signed)
Steroid injection 02/2021 at sports medicine clinic has very short term relief.  She has been out of tramadol and this will resume.

## 2021-07-03 NOTE — Telephone Encounter (Signed)
DECISION AS FOLLOWS:     Approvedon September 21 Request Reference Number: [id number]. ULTRAM TAB 50MG  is approved through 08/01/2021. Your patient may now fill this prescription and it will be covered. Drug Ultram 50MG  tablets Form [employer] Medicare Part D Electronic Prior Authorization Form (2017 NCPDP)  Also sent to pharmacy

## 2021-07-23 DIAGNOSIS — R490 Dysphonia: Secondary | ICD-10-CM | POA: Insufficient documentation

## 2021-07-23 DIAGNOSIS — K219 Gastro-esophageal reflux disease without esophagitis: Secondary | ICD-10-CM | POA: Insufficient documentation

## 2021-09-09 ENCOUNTER — Other Ambulatory Visit: Payer: Self-pay

## 2021-09-09 DIAGNOSIS — E118 Type 2 diabetes mellitus with unspecified complications: Secondary | ICD-10-CM

## 2021-09-10 MED ORDER — VICTOZA 18 MG/3ML ~~LOC~~ SOPN: 1.8000 mg | PEN_INJECTOR | Freq: Every day | SUBCUTANEOUS | 3 refills | Status: DC

## 2021-09-22 ENCOUNTER — Encounter: Payer: Self-pay | Admitting: Podiatry

## 2021-09-22 ENCOUNTER — Other Ambulatory Visit: Payer: Self-pay

## 2021-09-22 ENCOUNTER — Ambulatory Visit (INDEPENDENT_AMBULATORY_CARE_PROVIDER_SITE_OTHER): Payer: 59 | Admitting: Podiatry

## 2021-09-22 DIAGNOSIS — B351 Tinea unguium: Secondary | ICD-10-CM

## 2021-09-22 DIAGNOSIS — M79674 Pain in right toe(s): Secondary | ICD-10-CM

## 2021-09-22 DIAGNOSIS — L84 Corns and callosities: Secondary | ICD-10-CM | POA: Diagnosis not present

## 2021-09-22 DIAGNOSIS — M79675 Pain in left toe(s): Secondary | ICD-10-CM

## 2021-09-22 DIAGNOSIS — E1142 Type 2 diabetes mellitus with diabetic polyneuropathy: Secondary | ICD-10-CM

## 2021-09-26 NOTE — Progress Notes (Signed)
°  Subjective:  Patient ID: [name] [name], female    DOB: 1945/10/19,  MRN: [id number]  75 y.o. female presents with at risk foot care with history of diabetic neuropathy and callus(es) of both feet and painful thick toenails that are difficult to trim. Painful toenails interfere with ambulation. Aggravating factors include wearing enclosed shoe gear. Pain is relieved with periodic professional debridement. Painful calluses are aggravated when weightbearing with and without shoegear. Pain is relieved with periodic professional debridement..    Patient's blood sugar was 137 mg/dl today.  PCP: Angelica Pou, MD and last visit was: 06/19/2021.  Review of Systems: Negative except as noted in the HPI.   No Known Allergies  Objective:  There were no vitals filed for this visit. Constitutional Patient is a pleasant 75 y.o. African American female obese in NAD. AAO x 3.  Vascular Capillary fill time to digits immediate b/l.Palpable DP pulse(s) b/l LE. Nonpalpable PT pulse(s) b/l LE. Pedal hair sparse. No pain with calf compression b/l. No edema noted b/l LE. No ischemia or gangrene noted b/l LE. No cyanosis or clubbing noted b/l LE.  Neurologic Protective sensation diminished with 10g monofilament b/l. Vibratory sensation intact b/l. Proprioception intact bilaterally.  Dermatologic Pedal integument with normal turgor, texture and tone b/l LE. No open wounds b/l. No interdigital macerations b/l. Toenails 1-5 b/l elongated, thickened, discolored with subungual debris. +Tenderness with dorsal palpation of nailplates. Hyperkeratotic lesion(s) noted submet head 1 b/l and submet head 5 b/l.  Orthopedic: Muscle strength 5/5 to all lower extremity muscle groups bilaterally. 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 No pain, crepitus or joint limitation noted with ROM bilateral LE. Pes planus deformity noted bilateral LE.   Hemoglobin A1C Latest Ref Rng & Units 06/19/2021 03/13/2021 12/12/2020  HGBA1C 4.0 - 5.6 %  7.2(A) 13.7(A) 10.7(A)  Some recent data might be hidden   Assessment:   1. Pain due to onychomycosis of toenails of both feet   2. Callus   3. Diabetic peripheral neuropathy associated with type 2 diabetes mellitus ([place])    Plan:  Patient was evaluated and treated and all questions answered. Consent given for treatment as described below: -Examined patient. -Medicaid ABN signed for this year. Patient consents for services of paring of calluses today. Copy has been placed in patient chart. -Continue foot and shoe inspections daily. Monitor blood glucose per PCP/Endocrinologist's recommendations. -Mycotic toenails 1-5 bilaterally were debrided in length and girth with sterile nail nippers and dremel without incident. -Callus(es) submet head 1 b/l and submet head 5 b/l pared utilizing sterile scalpel blade without complication or incident. Total number debrided =4. -Patient/POA to call should there be question/concern in the interim.  Return in about 3 months (around 12/21/2021).  Marzetta Board, DPM

## 2021-09-29 ENCOUNTER — Other Ambulatory Visit: Payer: Self-pay

## 2021-09-29 NOTE — Progress Notes (Deleted)
Still need PPI? Refill requested

## 2021-10-02 ENCOUNTER — Encounter: Payer: 59 | Admitting: Internal Medicine

## 2021-10-15 MED ORDER — PANTOPRAZOLE SODIUM 40 MG PO TBEC: 40.0000 mg | DELAYED_RELEASE_TABLET | Freq: Every day | ORAL | 3 refills | Status: DC

## 2021-10-28 ENCOUNTER — Other Ambulatory Visit: Payer: Self-pay

## 2021-10-28 MED ORDER — METFORMIN HCL ER 500 MG PO TB24: 500.0000 mg | ORAL_TABLET | Freq: Two times a day (BID) | ORAL | 3 refills | Status: DC

## 2021-11-06 ENCOUNTER — Encounter: Payer: Self-pay | Admitting: Internal Medicine

## 2021-11-06 ENCOUNTER — Ambulatory Visit (INDEPENDENT_AMBULATORY_CARE_PROVIDER_SITE_OTHER): Payer: Medicare Other | Admitting: Internal Medicine

## 2021-11-06 VITALS — BP 175/79 | HR 88 | Temp 97.9°F | Ht 59.0 in | Wt 216.1 lb

## 2021-11-06 DIAGNOSIS — N183 Chronic kidney disease, stage 3 unspecified: Secondary | ICD-10-CM | POA: Diagnosis not present

## 2021-11-06 DIAGNOSIS — E1122 Type 2 diabetes mellitus with diabetic chronic kidney disease: Secondary | ICD-10-CM

## 2021-11-06 DIAGNOSIS — I5032 Chronic diastolic (congestive) heart failure: Secondary | ICD-10-CM

## 2021-11-06 DIAGNOSIS — E7841 Elevated Lipoprotein(a): Secondary | ICD-10-CM

## 2021-11-06 DIAGNOSIS — K9089 Other intestinal malabsorption: Secondary | ICD-10-CM | POA: Diagnosis not present

## 2021-11-06 DIAGNOSIS — M542 Cervicalgia: Secondary | ICD-10-CM | POA: Diagnosis not present

## 2021-11-06 DIAGNOSIS — E113219 Type 2 diabetes mellitus with mild nonproliferative diabetic retinopathy with macular edema, unspecified eye: Secondary | ICD-10-CM | POA: Diagnosis not present

## 2021-11-06 DIAGNOSIS — E118 Type 2 diabetes mellitus with unspecified complications: Secondary | ICD-10-CM

## 2021-11-06 DIAGNOSIS — R490 Dysphonia: Secondary | ICD-10-CM

## 2021-11-06 DIAGNOSIS — I2510 Atherosclerotic heart disease of native coronary artery without angina pectoris: Secondary | ICD-10-CM

## 2021-11-06 DIAGNOSIS — D638 Anemia in other chronic diseases classified elsewhere: Secondary | ICD-10-CM

## 2021-11-06 DIAGNOSIS — E669 Obesity, unspecified: Secondary | ICD-10-CM

## 2021-11-06 DIAGNOSIS — F325 Major depressive disorder, single episode, in full remission: Secondary | ICD-10-CM

## 2021-11-06 DIAGNOSIS — M1812 Unilateral primary osteoarthritis of first carpometacarpal joint, left hand: Secondary | ICD-10-CM | POA: Diagnosis not present

## 2021-11-06 DIAGNOSIS — K219 Gastro-esophageal reflux disease without esophagitis: Secondary | ICD-10-CM

## 2021-11-06 DIAGNOSIS — Z5986 Financial insecurity: Secondary | ICD-10-CM

## 2021-11-06 DIAGNOSIS — I10 Essential (primary) hypertension: Secondary | ICD-10-CM

## 2021-11-06 DIAGNOSIS — M549 Dorsalgia, unspecified: Secondary | ICD-10-CM

## 2021-11-06 DIAGNOSIS — I110 Hypertensive heart disease with heart failure: Secondary | ICD-10-CM

## 2021-11-06 DIAGNOSIS — R0789 Other chest pain: Secondary | ICD-10-CM

## 2021-11-06 DIAGNOSIS — I130 Hypertensive heart and chronic kidney disease with heart failure and stage 1 through stage 4 chronic kidney disease, or unspecified chronic kidney disease: Secondary | ICD-10-CM

## 2021-11-06 DIAGNOSIS — G8929 Other chronic pain: Secondary | ICD-10-CM

## 2021-11-06 DIAGNOSIS — E66812 Obesity, class 2: Secondary | ICD-10-CM

## 2021-11-06 DIAGNOSIS — M19042 Primary osteoarthritis, left hand: Secondary | ICD-10-CM | POA: Insufficient documentation

## 2021-11-06 DIAGNOSIS — R2243 Localized swelling, mass and lump, lower limb, bilateral: Secondary | ICD-10-CM

## 2021-11-06 LAB — POCT GLYCOSYLATED HEMOGLOBIN (HGB A1C): Hemoglobin A1C: 6.6 % — AB (ref 4.0–5.6)

## 2021-11-06 LAB — GLUCOSE, CAPILLARY: Glucose-Capillary: 82 mg/dL (ref 70–99)

## 2021-11-06 MED ORDER — DICLOFENAC SODIUM 1 % EX GEL: 2.0000 g | Freq: Four times a day (QID) | CUTANEOUS | 1 refills | Status: DC

## 2021-11-06 MED ORDER — LANTUS SOLOSTAR 100 UNIT/ML ~~LOC~~ SOPN: 22.0000 [IU] | PEN_INJECTOR | Freq: Every day | SUBCUTANEOUS | 5 refills | Status: DC

## 2021-11-06 NOTE — Assessment & Plan Note (Signed)
Taking Ultram butr doesn't seem to have an effect.

## 2021-11-06 NOTE — Assessment & Plan Note (Addendum)
Resolved on therapy. "I don't feel sad, my mood is fine.  I'm not worried about nothing".  Continue to monitor on current therapy.

## 2021-11-06 NOTE — Patient Instructions (Signed)
Ms. Dockstader, It was great to catch up with you again! Your blood sugar looks great.  Too great!  Let's decrease your Lantus to 22 units every night. 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  Try the Voltaren gel, I think you'll find it helpful.  If not, we'll keep working on it.  I'll call with today's blood test results.  Let's get together in 3 months.  Take care and stay well!  Dr. Jimmye Norman

## 2021-11-06 NOTE — Assessment & Plan Note (Addendum)
Appt scheduled 01/01/22 DR. Groat

## 2021-11-06 NOTE — Progress Notes (Signed)
76 yo [name] [name] is here for routine f/u of DM2, HTN, among other chronic conditions.  She was unable to make a quarterly appt in 09/2021 so we are a month over intended visit.  Since our last visit together on 06/19/2021,  She was evaluated by ENT doctor's pain our in the [facility] on 07/23/2021 for hoarseness.  Dr. [place] our noticed that she had been seen by Dr. Blenda Nicely in the past was diagnosed with vocal cord atrophy and laryngopharyngeal reflux.  At that visit her blood pressure was 161/85.  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  Reflux diet and lifestyle modifications were reviewed with the patient and she was recommended to continue on an OTC acid suppressor.  As needed follow-up offered.  09/22/2021-podiatry appointment for toenail management  HPI-doing ok, doesn't want to complain.  (Usually difficult to get her to comment on any symptoms). No change in her chronic back pain "I take the tramadol but it does not seem to do much, I just live with it" Diarrhea well managed when she takes the cholestyramine. BP up after rushing to get to visit. Hasn't taken meds yet.  Hasn't had breakfast.   Didn't sleep last night, usually has a hard time sleeping the night before appts - worried she may miss it. (Transportation a problem, she didn't know she had to call [place] 3 days before.) Monitoring her glucose - has been doing well though having some lows into the 50s fasting.  Feels tightness in chest sometimes, wants to see a new cardiologist, hers moved away.  Bills are her biggest concern.  Roommate friend who was living with her and helped pay bills has died.  She misses him terribly.  This is keeping her up at nignt.  Worried she could become homeless.    Objective BP (!) 175/79 (BP Location: Right Arm, Cuff Size: Small)    Pulse 88    Temp 97.9 F (36.6 C) (Oral)    Ht 4\' 11"  (1.499 m)    Wt 216 lb 1.6 oz (98 kg)    SpO2 100%    BMI  43.65 kg/m  Exam:   Very obese, predominantly central JVD bout 5 cm in sitting position LUngs scattered basilar rales Heart RRR extra sound s3? Legs minimal edema.   Single bumps, R (tender, approx 79mm) and L (non-tender, approx 12 mm) lateral lower leg, subdermal, soft, not mobile, easily compressible, no skin discoloration, symmetrically located about 9-10 cm above each lateral malleolus. L thumb base swollen tender warm with preserved ROM    Assessment and plan (see also problem based documentation)  DM2 tightly controlled with hypoglycemic episodes; reduce Lantus.  Continue Victoza 1.8 mg.  HTN uncontrolled though she didn't take meds today.  Had forgotten after last visit to increase amlodipine 5 mg from 1 tab daily to 2 tabs daily.  New prescription for amlodipine 10 mg sent.  L thumb OS - topical Voltaren  SW referral for discussion about finances and home.

## 2021-11-06 NOTE — Assessment & Plan Note (Addendum)
Average on readout 114, range 55-237.  A1C today 6.6.  Decrease lantus

## 2021-11-06 NOTE — Assessment & Plan Note (Addendum)
Present intermittently. Not today.  See entry for laryngopharyngeal reflux recently evaluated by ENT.

## 2021-11-07 MED ORDER — AMLODIPINE BESYLATE 10 MG PO TABS: 10.0000 mg | ORAL_TABLET | Freq: Every day | ORAL | 3 refills | Status: DC

## 2021-11-07 NOTE — Assessment & Plan Note (Signed)
Remains off Lasix with no return of edema and no dyspnea, though she does state that she has been having some chest tightness recently.  She has not seen a cardiologist in some time as hers had moved.  I will place a new referral.

## 2021-11-07 NOTE — Assessment & Plan Note (Signed)
Weight 216.1, up from nadir of 180.9 in June 2022, down from peak of 225 09/2020 when Victoza was increased. She is currently on Victoza 1.8.  This was stopped and then resumed at a lower dose as she was very concerned about the abrupt and in her mind excessive weight loss which occurred last year.

## 2021-11-07 NOTE — Assessment & Plan Note (Signed)
R leg lesion is becoming more tender.  Dx is illusive.  Not c/w panniculitis, erythema nodosum, or fibroid.  I'll discuss with colleagues a cost-effective way to further investigate.  She is very concerned about bills.

## 2021-11-07 NOTE — Assessment & Plan Note (Signed)
Experiencing chest tightness, sometimes but not always exertional, mild, concerning given risk factors.  Cath 2007 nonobstructive disease.  She desires referral to cardiologist as hers has moved (name not recalled).

## 2021-11-07 NOTE — Assessment & Plan Note (Signed)
175/79, though did not take her medications this morning due to being rushed.  She forgot to increase her amlodipine from 5 to 10 mg daily as discussed at last visit.  This will need to be done before we consider increase in ACE inhibitor.

## 2021-11-07 NOTE — Assessment & Plan Note (Signed)
Dx by laryngoscopy, ENT Dr. [place] Carbo with [facility].  Reflux diet and OTC acid suppressor were recommended.

## 2021-11-07 NOTE — Assessment & Plan Note (Signed)
Diarrhea well controlled with cholestyramine which she takes on a periodic basis with good results.  Monitor.

## 2021-11-08 LAB — CBC
Hematocrit: 35.9 % (ref 34.0–46.6)
Hemoglobin: 12.1 g/dL (ref 11.1–15.9)
MCH: 29.7 pg (ref 26.6–33.0)
MCHC: 33.7 g/dL (ref 31.5–35.7)
MCV: 88 fL (ref 79–97)
Platelets: 309 10*3/uL (ref 150–450)
RBC: 4.07 x10E6/uL (ref 3.77–5.28)
RDW: 14.7 % (ref 11.7–15.4)
WBC: 9.3 10*3/uL (ref 3.4–10.8)

## 2021-11-08 LAB — BMP8+ANION GAP
Anion Gap: 15 mmol/L (ref 10.0–18.0)
BUN/Creatinine Ratio: 19 (ref 12–28)
BUN: 17 mg/dL (ref 8–27)
CO2: 24 mmol/L (ref 20–29)
Calcium: 9.5 mg/dL (ref 8.7–10.3)
Chloride: 102 mmol/L (ref 96–106)
Creatinine, Ser: 0.91 mg/dL (ref 0.57–1.00)
Glucose: 112 mg/dL — ABNORMAL HIGH (ref 70–99)
Potassium: 4.4 mmol/L (ref 3.5–5.2)
Sodium: 141 mmol/L (ref 134–144)
eGFR: 65 mL/min/{1.73_m2} (ref >59)

## 2021-11-08 LAB — LIPID PANEL
Chol/HDL Ratio: 2.7 ratio (ref 0.0–4.4)
Cholesterol, Total: 203 mg/dL — ABNORMAL HIGH (ref 100–199)
HDL: 74 mg/dL (ref >39)
LDL Chol Calc (NIH): 111 mg/dL — ABNORMAL HIGH (ref 0–99)
Triglycerides: 102 mg/dL (ref 0–149)
VLDL Cholesterol Cal: 18 mg/dL (ref 5–40)

## 2021-11-10 NOTE — Progress Notes (Signed)
CAlled [name] [name] with her blood test results, in particular noting the improved renal fxn (now CKD2) and her persistent hyperlipidemia (statin stopped due to asymptomatic hepatitis last summer).  No changes at this time.  I will talk with her next visit about Zetia.

## 2021-11-12 ENCOUNTER — Telehealth: Payer: Self-pay | Admitting: *Deleted

## 2021-11-12 NOTE — Chronic Care Management (AMB) (Signed)
°  Care Management   Note  11/12/2021 Name: [name] [name] MRN: [id number] DOB: [birth year]  [name] [name] is a 76 y.o. year old female who is a primary care patient of Angelica Pou, MD. I reached out to [employer] by phone today in response to a referral sent by [name] [name]'s primary care provider.   [name] [name] was given information about care management services today including:  Care management services include personalized support from designated clinical staff supervised by her physician, including individualized plan of care and coordination with other care providers 24/7 contact phone numbers for assistance for urgent and routine care needs. The patient may stop care management services at any time by phone call to the office staff.  Patient agreed to services and verbal consent obtained.   Follow up plan: Telephone appointment with care management team member scheduled for:11/17/21  [place] [facility]  Direct Dial: [phone]

## 2021-11-17 ENCOUNTER — Ambulatory Visit: Payer: Medicaid Other | Admitting: Licensed Clinical Social Worker

## 2021-11-17 ENCOUNTER — Telehealth: Payer: Self-pay | Admitting: Licensed Clinical Social Worker

## 2021-11-17 NOTE — Telephone Encounter (Signed)
°  Care Management   Follow Up Note   11/17/2021 Name: [name] [name] MRN: [id number] DOB: Mar 16, 1946   Referred by: Angelica Pou, MD Reason for referral : No chief complaint on file.   An unsuccessful telephone outreach was attempted today. The patient was referred to the case management team for assistance with care management and care coordination.   Follow Up Plan: The care management team will reach out to the patient again over the next 7 days.   Milus Height, [place]  Social Worker [facility] Care Management  [phone]

## 2021-11-18 NOTE — Chronic Care Management (AMB) (Signed)
°  Care Management   Social Work Visit Note  11/18/2021 Name: [name] [name] MRN: [id number] DOB: 1946/04/11  [name] [name] is a 76 y.o. year old female who sees Jimmye Norman, Elaina Pattee, MD for primary care. The care management team was consulted for assistance with care management and care coordination needs related to [facility] Resources    Patient was given the following information about care management and care coordination services today, agreed to services, and gave verbal consent: 1.care management/care coordination services include personalized support from designated clinical staff supervised by their physician, including individualized plan of care and coordination with other care providers 2. 24/7 contact phone numbers for assistance for urgent and routine care needs. 3. The patient may stop care management/care coordination services at any time by phone call to the office staff.  Engaged with patient by telephone for initial visit in response to provider referral for social work chronic care management and care coordination services.  Assessment: Review of patient history, allergies, and health status during evaluation of patient need for care management/care coordination services.    Interventions:  Patient interviewed and appropriate assessments performed Collaborated with clinical team regarding patient needs  SW and patients initial outreach. SW completed SDOH screening for food, transportation and housing. Patients advised of no needs. Patient has medicaid and stated no concerns.  SW gave patient contact information for SW if needed in the future.  Patient stated no additional questions or concerns. Patient stated no additional follow up is needed at this time.  SW discussed financial assistance with medical bills through [place].. Patient is aware of LIEAP and CIP through DSS.  SDOH (Social Determinants of Health) assessments performed: Yes     Plan:   SW will follow up  within 30 days regarding finances with patient.  Milus Height, [place]  Social Worker [facility] Care Management  [phone]

## 2021-11-18 NOTE — Patient Instructions (Addendum)
Visit Information  Instructions:   Patient was given the following information about care management and care coordination services today, agreed to services, and gave verbal consent: 1.care management/care coordination services include personalized support from designated clinical staff supervised by their physician, including individualized plan of care and coordination with other care providers 2. 24/7 contact phone numbers for assistance for urgent and routine care needs. 3. The patient may stop care management/care coordination services at any time by phone call to the office staff.  Patient verbalizes understanding of instructions and care plan provided today and agrees to view in [place]. Active MyChart status confirmed with patient.    SW will follow up in [place], [place]  Social Worker [facility] Care Management  [phone]

## 2021-11-19 NOTE — Progress Notes (Signed)
Cardiology Office Note:    Date:  11/20/2021   ID:  [name] [name], [place] 07-04-1946, MRN [id number]  PCP:  Angelica Pou, MD   [place] Prov[facility] Cardiologist:  Ena Dawley, MD     Referring MD: Angelica Pou, MD   CC: chest tightness Consulted for the evaluation of CAD at the behest of Angelica Pou, MD  History of Present Illness:    [name] [name] is a 76 y.o. female with a hx of non obstructive CAD,  HLD with DM and aortic atherosclerosis, Morbid obesity, Mild AI, Mild MR, Mild TR, PAD with SFA disease for medical mgmt.  Patient notes that she having new chest tightness. Her chest tightness comes and goes, since December.  Its a light tightness. 2/10 CP.  Made better by resting.  Notes that she has limited walking ability due to leg and back pain.  Overall feels deconditioned.  Breathing is fine at rest has exertional DOE.  Notes leg swelling that improved with compression stockings.  Stable weight.  Ambulatory BP not done.   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 Katy Fitch annual f/u, most recent 09/2019   CAD, non-obstructive 08/26/2006   Cath 07/05:multiple areas of nonobstructive disease = medical & RF mgmt, well preserved global systolic function. Dr. Lia Foyer. Nuclear med stress test 01/2014 : Normal stress nuclear study. LV Ejection Fraction: 76%. LV Wall Motion: NL LV Function; NL Wall Motion.       Chronic neck and back pain 01/03/2013   Multifactorial. Non opioid requiring.   L2-5 fusion, with hardware at L2-3, stable per MRI 2010. Followed by neurosurgery (Dr. 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     CKD stage 2 due to type 2 diabetes mellitus ([place])  12/11/2016   DEPRESSION 08/26/2006   On paxil     Diabetic peripheral neuropathy associated with type 2 diabetes mellitus ([place])    Diabetic retinopathy associated with controlled type 2 diabetes mellitus ([place]) 09/2019   with  macular edema OS - Dr. Katy Fitch   DYSLIPIDEMIA 11/01/2006   Gallstones    GERD 08/26/2006   Heartburn QHS.      Hx of blood transfusion without reported diagnosis    Hx of esophageal dysphagia requiring dilatation. 03/03/2018   hx of esophageal stenosis s/p dilatation    Hx of small intestinal bacterial overgrowth 12/24/2015   Presumed / clinical dx 2016. Complete resolution with metronidazole (insuance would not cover rifaximin) Flare 2017. Did not respond to ABX. Referral to GI B12 and ferritin low supporting malabsorption.    HYPERTENSION 08/26/2006   Hypertensive heart disease with chronic diastolic congestive heart failure ([place]) 05/01/2020   Morbid obesity with BMI of 50.0-59.9, adult ([place])    Personal history of colonic polyps 2020   Adenomatous; f/u 2023 [facility]   PVD (peripheral vascular disease) ([place]), resolved per f/u ABIs 2016 03/27/2014   2015 LE arterial Duplex - Possible mild inflow disease on the left. 0-49% bilateral SFA disease, without focal stenosis. Three vesel run-off, bilaterally. Medical tx.    S/P bilateral cataract extraction    Type 2 diabetes mellitus with complications ([place]) 48/54/6270    Past Surgical History:  Procedure Laterality  Date   CATARACT EXTRACTION, BILATERAL     CHOLECYSTECTOMY     pt denies 04/07/16   COLONOSCOPY     endometrial biospy     ESOPHAGEAL DILATION     LEFT HEART CATH     lumbar fusion surgery  10/08   L3-L5   POLYPECTOMY     posterior lumbar interfusion surgery  07/18/07   L2-L3   rotator cuff surgery Bilateral     Current Medications: Current Meds  Medication Sig   Accu-Chek FastClix Lancets MISC USE THREE TIMES DAILY. 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   aspirin 81 MG tablet Take 1 tablet (81 mg total) by mouth daily.   atenolol (TENORMIN) 100 MG tablet Take 100 mg by mouth daily.   Blood Glucose Monitoring Suppl (ACCU-CHEK GUIDE) w/Device KIT 1 each by Does not apply route 3 (three) times daily.   Cholecalciferol (VITAMIN D3 GUMMIES) 25 MCG (1000 UT) CHEW Chew 1 tablet (1,000 Units total) by mouth daily.   colestipol (COLESTID) 1 g tablet Take 1-2 tablets (1-2 g total) by mouth 2 (two) times daily as needed.   diclofenac Sodium (VOLTAREN) 1 % GEL Apply 2 g topically 4 (four) times daily. To painful areas of hands.   glucose blood (ACCU-CHEK GUIDE) test strip Use to test blood glucose 3 times daily. Dx E11.65   insulin glargine (LANTUS SOLOSTAR) 100 UNIT/ML Solostar Pen Inject 22 Units into the skin at bedtime.   Insulin Pen Needle (BD PEN NEEDLE NANO U/F) 32G X 4 MM MISC USE TO INJECT VICTOZA ONCE A DAY AND LANTUS ONCE A DAY AS DIRECTED   liraglutide (VICTOZA) 18 MG/3ML SOPN Inject 1.8 mg into the skin daily. ADMINISTER 1.2 MG UNDER THE SKIN DAILY   lisinopril (ZESTRIL) 20 MG tablet Take 1 tablet (20 mg total) by mouth daily.   loratadine (CLARITIN) 10 MG tablet TAKE 1 TABLET(10 MG) BY MOUTH DAILY   metFORMIN (GLUCOPHAGE-XR) 500 MG 24 hr tablet Take 1 tablet (500 mg total) by mouth in the morning and at bedtime.   nitroGLYCERIN (NITROSTAT) 0.4 MG SL tablet PLACE 1 TABLET UNDER THE TONGUE EVERY 5 MINUTES AS NEEDED FOR CHEST PAIN   pantoprazole (PROTONIX) 40 MG tablet Take 1 tablet (40 mg total) by mouth daily.   PARoxetine (PAXIL) 40 MG tablet Take 1 tablet (40 mg total) by mouth every morning.   pregabalin (LYRICA) 150 MG capsule Take 1 capsule (150 mg total) by mouth every evening. TAKE 1 CAPSULE(150 MG) BY MOUTH DAILY   spironolactone (ALDACTONE) 25 MG tablet Take 0.5 tablets (12.5 mg total) by mouth daily.   ULTRAM 50 MG tablet Take 1 tablet (50 mg total) by mouth 2 (two) times daily as needed for  moderate pain or severe pain.     Allergies:   Patient has no known allergies.   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 Has two daughters and several grand kids. Severe limitation in ambulation. Occ goes out to church but usually daughter gets groceries.    She is one of 7 kids and the oldest so helped raise the other 6.   Social Determinants of Health   Financial Resource Strain: Not on file  Food Insecurity: No Food Insecurity   Worried About Charity fundraiser in the Last Year: Never true   Ran Out of Food in the Last Year: Never true  Transportation Needs: No Transportation Needs   Lack of Transportation (Medical): No   Lack of Transportation (Non-Medical): No  Physical Activity: Not on file  Stress: Not on file  Social Connections: Not on file     Family History: The patient's family history includes Cirrhosis in her mother; Diabetes in her father and sister; Hyperlipidemia in her sister; Hypertension in her father and sister. There is no history of Colon cancer, Throat cancer, Esophageal cancer, Rectal cancer, Stomach cancer, or Colon polyps.  ROS:   Please see the history of present illness.     All other systems reviewed and are negative.  EKGs/Labs/Other Studies Reviewed:    The following studies were reviewed today:  EKG:  EKG is  ordered today.  The ekg ordered today demonstrates  11/20/21: SR rate  86  Transthoracic Echocardiogram: Date: 03/16/21 Results:  1. Left ventricular ejection fraction, by estimation, is 55 to 60%. The  left ventricle has normal function. The left ventricle has no regional  wall motion abnormalities. Left ventricular diastolic parameters are  indeterminate.   2. Right ventricular systolic function is normal. The right ventricular  size is normal. There is normal pulmonary artery systolic pressure. The  estimated right ventricular systolic pressure is 32.6 mmHg.   3. The mitral valve is grossly normal. Mild mitral valve regurgitation.   4. The aortic valve is tricuspid. Aortic valve regurgitation is mild.   5. The inferior vena cava is normal in size with greater than 50%  respiratory variability, suggesting right atrial pressure of 3 mmHg.    ECG or NM Stress Testing : Date: 12/12/19 Results: Nuclear stress EF: 78%. The left ventricular ejection fraction is hyperdynamic (>65%). There was no ST segment deviation noted during stress. The study is normal. This is a low risk study.    Transthoracic Echocardiogram: Date: 03/16/21 Results:  1. Left ventricular ejection fraction, by estimation, is 55 to 60%. The  left ventricle has normal function. The left ventricle has no regional  wall motion abnormalities. Left ventricular diastolic parameters are  indeterminate.   2. Right ventricular systolic function is normal. The right ventricular  size is normal. There is normal pulmonary artery systolic pressure. The  estimated right ventricular systolic pressure is 71.2 mmHg.   3. The mitral valve is grossly normal. Mild mitral valve regurgitation.   4. The aortic valve is tricuspid. Aortic valve regurgitation is mild.   5. The inferior vena cava is normal in size with greater than 50%  respiratory variability, suggesting right atrial pressure of 3 mmHg.    Recent Labs: 03/13/2021: TSH 2.280 04/17/2021: ALT 15 11/06/2021: BUN 17; Creatinine, Ser 0.91; Hemoglobin  12.1; Platelets 309; Potassium 4.4; Sodium 141  Recent Lipid Panel    Component Value Date/Time   CHOL 203 (H) 11/06/2021 1204   TRIG  102 11/06/2021 1204   HDL 74 11/06/2021 1204   CHOLHDL 2.7 11/06/2021 1204   CHOLHDL 1.9 12/30/2015 0832   VLDL 12 12/30/2015 0832   LDLCALC 111 (H) 11/06/2021 1204   \     Physical Exam:    VS:  BP (!) 151/80    Pulse 86    Ht 5' (1.524 m)    Wt 98 kg    SpO2 97%    BMI 42.18 kg/m     Wt Readings from Last 3 Encounters:  11/20/21 98 kg  11/06/21 98 kg  06/19/21 91.4 kg    Gen: no distress, morbid obesity   Neck: No JVD,  Cardiac: No Rubs or Gallops, no murmur, RRR +2 radial pulses Respiratory: Clear to auscultation bilaterally, normal effort, normal  respiratory rate GI: Soft, nontender, non-distended  MS: No  edema (she is actively wearing her compression stockings;  moves all extremities Integument: Skin feels well Neuro:  At time of evaluation, alert and oriented to person/place/time/situation  Psych: Normal affect, patient feels fine   ASSESSMENT:    1. Chest pain of uncertain etiology   2. Hypertension, unspecified type   3. CKD stage 3 due to type 2 diabetes mellitus (HCC)    PLAN:    Moderate non obstructive CAD HTN with DM HLD with DM Aortic atherosclerosis PAD without claudication CKD Stage IIIa - ASA 81 - continue lisinopril 20 - will add aldactone 12.5 mg PO daily and check labs in one two weeks, may need to try non kidney based regimen - continue norvasc 10 mg - will get lexiscan - start AMB BP monitoring  Mild AI, TR, and MR - last echo 2022 and stable, 2025 unless new issues  4 months with me or PA  Medication Adjustments/Labs and Tests Ordered: Current medicines are reviewed at length with the patient today.  Concerns regarding medicines are outlined above.  Orders Placed This Encounter  Procedures   Basic metabolic panel   Cardiac Stress Test: Informed Consent Details: Physician/Practitioner  Attestation; Transcribe to consent form and obtain patient signature   MYOCARDIAL PERFUSION IMAGING   EKG 12-Lead   Meds ordered this encounter  Medications   spironolactone (ALDACTONE) 25 MG tablet    Sig: Take 0.5 tablets (12.5 mg total) by mouth daily.    Dispense:  45 tablet    Refill:  3    Patient Instructions  Medication Instructions:  Your physician has recommended you make the following change in your medication:  START: spironolactone (Aldactone) 12.5 mg by mouth daily   *If you need a refill on your cardiac medications before your next appointment, please call your pharmacy*   Lab Work: ON SAME DAY AS STRESS TEST: BMP   If you have labs (blood work) drawn today and your tests are completely normal, you will receive your results only by: [place] (if you have MyChart) OR A paper copy in the mail If you have any lab test that is abnormal or we need to change your treatment, we will call you to review the results.   Testing/Procedures: Your physician has requested that you have a lexiscan myoview. For further information please visit [email or web address]. Please follow instruction sheet, as given.    Follow-Up: At [facility], you and your health needs are our priority.  As part of our continuing mission to provide you with exceptional heart care, we have created designated Provider Care Teams.  These Care Teams include your primary Cardiologist (physician) and Advanced Practice  Providers (APPs -  Physician Assistants and Nurse Practitioners) who all work together to provide you with the care you need, when you need it.  We recommend signing up for the patient portal called "MyChart".  Sign up information is provided on this After Visit Summary.  MyChart is used to connect with patients for Virtual Visits (Telemedicine).  Patients are able to view lab/test results, encounter notes, upcoming appointments, etc.  Non-urgent messages can be sent to your provider as  well.   To learn more about what you can do with MyChart, go to [email or web address].    Your next appointment:   3 - 4  month(s)  The format for your next appointment:   In Person  Provider:   Rudean Haskell, MD or Melina Copa, PA-C or Ermalinda Barrios, PA-C          Signed, Werner Lean, MD  11/20/2021 10:55 AM    [place]

## 2021-11-20 ENCOUNTER — Ambulatory Visit (INDEPENDENT_AMBULATORY_CARE_PROVIDER_SITE_OTHER): Payer: Medicare Other | Admitting: Internal Medicine

## 2021-11-20 ENCOUNTER — Encounter: Payer: Self-pay | Admitting: Internal Medicine

## 2021-11-20 ENCOUNTER — Other Ambulatory Visit: Payer: Self-pay

## 2021-11-20 VITALS — BP 151/80 | HR 86 | Ht 60.0 in | Wt 216.0 lb

## 2021-11-20 DIAGNOSIS — R079 Chest pain, unspecified: Secondary | ICD-10-CM

## 2021-11-20 DIAGNOSIS — I10 Essential (primary) hypertension: Secondary | ICD-10-CM

## 2021-11-20 DIAGNOSIS — E1122 Type 2 diabetes mellitus with diabetic chronic kidney disease: Secondary | ICD-10-CM | POA: Diagnosis not present

## 2021-11-20 DIAGNOSIS — N183 Chronic kidney disease, stage 3 unspecified: Secondary | ICD-10-CM

## 2021-11-20 MED ORDER — SPIRONOLACTONE 25 MG PO TABS: 12.5000 mg | ORAL_TABLET | Freq: Every day | ORAL | 3 refills | Status: DC

## 2021-11-20 NOTE — Patient Instructions (Signed)
Medication Instructions:  Your physician has recommended you make the following change in your medication:  START: spironolactone (Aldactone) 12.5 mg by mouth daily   *If you need a refill on your cardiac medications before your next appointment, please call your pharmacy*   Lab Work: ON SAME DAY AS STRESS TEST: BMP   If you have labs (blood work) drawn today and your tests are completely normal, you will receive your results only by: [place] (if you have MyChart) OR A paper copy in the mail If you have any lab test that is abnormal or we need to change your treatment, we will call you to review the results.   Testing/Procedures: Your physician has requested that you have a lexiscan myoview. For further information please visit [email or web address]. Please follow instruction sheet, as given.    Follow-Up: At [facility], you and your health needs are our priority.  As part of our continuing mission to provide you with exceptional heart care, we have created designated Provider Care Teams.  These Care Teams include your primary Cardiologist (physician) and Advanced Practice Providers (APPs -  Physician Assistants and Nurse Practitioners) who all work together to provide you with the care you need, when you need it.  We recommend signing up for the patient portal called "MyChart".  Sign up information is provided on this After Visit Summary.  MyChart is used to connect with patients for Virtual Visits (Telemedicine).  Patients are able to view lab/test results, encounter notes, upcoming appointments, etc.  Non-urgent messages can be sent to your provider as well.   To learn more about what you can do with MyChart, go to [email or web address].    Your next appointment:   3 - 4  month(s)  The format for your next appointment:   In Person  Provider:   Rudean Haskell, MD or Melina Copa, PA-C or Ermalinda Barrios, PA-C

## 2021-11-25 ENCOUNTER — Telehealth (HOSPITAL_COMMUNITY): Payer: Self-pay

## 2021-11-25 NOTE — Telephone Encounter (Signed)
Spoke with the patient, detailed instructions given. She stated that her daughter would be bringing her. Asked to call back with any questions. S.Latesia Norrington EMTP

## 2021-11-27 ENCOUNTER — Other Ambulatory Visit: Payer: Medicaid Other

## 2021-11-27 ENCOUNTER — Other Ambulatory Visit: Payer: Self-pay

## 2021-11-27 ENCOUNTER — Ambulatory Visit (HOSPITAL_COMMUNITY): Payer: Medicare Other | Attending: Cardiovascular Disease

## 2021-11-27 DIAGNOSIS — R079 Chest pain, unspecified: Secondary | ICD-10-CM | POA: Insufficient documentation

## 2021-11-27 DIAGNOSIS — I10 Essential (primary) hypertension: Secondary | ICD-10-CM | POA: Diagnosis not present

## 2021-11-27 LAB — MYOCARDIAL PERFUSION IMAGING
Base ST Depression (mm): 0 mm
LV dias vol: 56 mL (ref 46–106)
LV sys vol: 15 mL
Nuc Stress EF: 73 %
Peak HR: 93 {beats}/min
Rest HR: 77 {beats}/min
Rest Nuclear Isotope Dose: 10.7 mCi
SDS: 4
SRS: 1
SSS: 5
Stress Nuclear Isotope Dose: 31.3 mCi
TID: 0.92

## 2021-11-27 LAB — BASIC METABOLIC PANEL
BUN/Creatinine Ratio: 22 (ref 12–28)
BUN: 20 mg/dL (ref 8–27)
CO2: 25 mmol/L (ref 20–29)
Calcium: 8.9 mg/dL (ref 8.7–10.3)
Chloride: 102 mmol/L (ref 96–106)
Creatinine, Ser: 0.89 mg/dL (ref 0.57–1.00)
Glucose: 113 mg/dL — ABNORMAL HIGH (ref 70–99)
Potassium: 4.7 mmol/L (ref 3.5–5.2)
Sodium: 141 mmol/L (ref 134–144)
eGFR: 67 mL/min/{1.73_m2} (ref >59)

## 2021-11-27 MED ORDER — TECHNETIUM TC 99M TETROFOSMIN IV KIT
31.3000 | PACK | Freq: Once | INTRAVENOUS | Status: AC | PRN
  Administered 2021-11-27: 31.3 via INTRAVENOUS
  Filled 2021-11-27: qty 32

## 2021-11-27 MED ORDER — TECHNETIUM TC 99M TETROFOSMIN IV KIT
10.7000 | PACK | Freq: Once | INTRAVENOUS | Status: AC | PRN
  Administered 2021-11-27: 10.7 via INTRAVENOUS
  Filled 2021-11-27: qty 11

## 2021-11-27 MED ORDER — REGADENOSON 0.4 MG/5ML IV SOLN
0.4000 mg | Freq: Once | INTRAVENOUS | Status: AC
  Administered 2021-11-27: 0.4 mg via INTRAVENOUS

## 2021-12-04 ENCOUNTER — Other Ambulatory Visit: Payer: Self-pay

## 2021-12-04 NOTE — Telephone Encounter (Signed)
Error

## 2021-12-09 ENCOUNTER — Other Ambulatory Visit: Payer: Self-pay | Admitting: *Deleted

## 2021-12-09 MED ORDER — ATENOLOL 100 MG PO TABS: 100.0000 mg | ORAL_TABLET | Freq: Every day | ORAL | 3 refills | Status: DC

## 2021-12-09 NOTE — Telephone Encounter (Signed)
Next appt scheduled 12/25/21 with PCP.

## 2021-12-11 ENCOUNTER — Other Ambulatory Visit: Payer: Self-pay | Admitting: Internal Medicine

## 2021-12-11 DIAGNOSIS — Z1231 Encounter for screening mammogram for malignant neoplasm of breast: Secondary | ICD-10-CM

## 2021-12-25 ENCOUNTER — Ambulatory Visit (INDEPENDENT_AMBULATORY_CARE_PROVIDER_SITE_OTHER): Payer: Medicare Other | Admitting: Internal Medicine

## 2021-12-25 ENCOUNTER — Telehealth: Payer: Self-pay

## 2021-12-25 VITALS — BP 161/68 | HR 72 | Temp 98.0°F | Ht 59.0 in | Wt 218.7 lb

## 2021-12-25 DIAGNOSIS — I2510 Atherosclerotic heart disease of native coronary artery without angina pectoris: Secondary | ICD-10-CM | POA: Diagnosis not present

## 2021-12-25 DIAGNOSIS — E1122 Type 2 diabetes mellitus with diabetic chronic kidney disease: Secondary | ICD-10-CM | POA: Diagnosis not present

## 2021-12-25 DIAGNOSIS — I5032 Chronic diastolic (congestive) heart failure: Secondary | ICD-10-CM

## 2021-12-25 DIAGNOSIS — E7841 Elevated Lipoprotein(a): Secondary | ICD-10-CM | POA: Diagnosis not present

## 2021-12-25 DIAGNOSIS — I110 Hypertensive heart disease with heart failure: Secondary | ICD-10-CM

## 2021-12-25 DIAGNOSIS — G8929 Other chronic pain: Secondary | ICD-10-CM | POA: Diagnosis not present

## 2021-12-25 DIAGNOSIS — E118 Type 2 diabetes mellitus with unspecified complications: Secondary | ICD-10-CM

## 2021-12-25 DIAGNOSIS — N183 Chronic kidney disease, stage 3 unspecified: Secondary | ICD-10-CM

## 2021-12-25 DIAGNOSIS — E1142 Type 2 diabetes mellitus with diabetic polyneuropathy: Secondary | ICD-10-CM

## 2021-12-25 DIAGNOSIS — E66813 Obesity, class 3: Secondary | ICD-10-CM

## 2021-12-25 DIAGNOSIS — I130 Hypertensive heart and chronic kidney disease with heart failure and stage 1 through stage 4 chronic kidney disease, or unspecified chronic kidney disease: Secondary | ICD-10-CM

## 2021-12-25 DIAGNOSIS — M549 Dorsalgia, unspecified: Secondary | ICD-10-CM | POA: Diagnosis not present

## 2021-12-25 DIAGNOSIS — M47812 Spondylosis without myelopathy or radiculopathy, cervical region: Secondary | ICD-10-CM

## 2021-12-25 DIAGNOSIS — M542 Cervicalgia: Secondary | ICD-10-CM

## 2021-12-25 DIAGNOSIS — I10 Essential (primary) hypertension: Secondary | ICD-10-CM

## 2021-12-25 MED ORDER — SPIRONOLACTONE 25 MG PO TABS: 25.0000 mg | ORAL_TABLET | Freq: Every day | ORAL | 3 refills | Status: DC

## 2021-12-25 MED ORDER — ULTRAM 50 MG PO TABS: 50.0000 mg | ORAL_TABLET | Freq: Two times a day (BID) | ORAL | 5 refills | Status: DC | PRN

## 2021-12-25 NOTE — Assessment & Plan Note (Signed)
Neck pain due to combination of C-S DJD and secondary paraspinal and trapezius muscle tension.  Conservative therapy.  Heating pad, topical muscle rub.   ?

## 2021-12-25 NOTE — Patient Instructions (Signed)
Rosa, ?Great to see you as always, although I wish you were feeling less pain.  I will check your kidney function today and call you tomorrow.  I think we can make some medicine changes to help both your blood pressure and your nerve pain, but we need to make sure it will be safe for your kidneys. ? ?See if some muscle pain rub from the drugstore or a heating pad might help with your neck and back pain.  I'll look into the problem with the tramadol in the meantime. ? ?Talk soon, ? ?Dr. Jimmye Norman ?

## 2021-12-25 NOTE — Assessment & Plan Note (Signed)
WEight remains up despite maximal Trulicity dose, despite significant weight loss incurred at this dose last summer.  This is not hypervolemia.  Will discuss with RD/diabetician a change to semaglutide.  DM control is good. ?

## 2021-12-25 NOTE — Assessment & Plan Note (Signed)
Renal fxn had recently improved to stage 2 - recheck again today after addition of spironolactone.  Adjust diagnosis as necessary. ?

## 2021-12-25 NOTE — Progress Notes (Signed)
76 yo [name] [name] here for routine f/u of DM2, HTN, chronic back and joint pain, and recent referral to cardiologist per her request for evaluation of chest tightness.   ? ?Last visit with me 11/06/21, cardiologist 11/20/21.   ? ? ?SHe has been feeling "so-so", her standard answer.  Most bothersome problem is neck pain, and LBP across her lower back.  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  She had been managing with tramadol, but pharmacy told her that Medicare won't cover this anymore,;hasn't had any for some time.  Uses [facility] on [address].   ? ?Has used topical cream I gave her, doesn't help much.  Continues to do her own ADLs, wants to keep moving.   ? ?BPs running high at home per her record which she brings for review (140-160 and even higher). ? ?Diarrhea is ok, sometimes doesn't respond well to the colestid.   ? ? L thumb still bothers her. Doesn't want to have surgery.  SHe seems to have been recommended possible carpal tunnel release? "I had the test where they shock the nerves - sometimes my hands are numb and sometimes my thumb and fingers hurt".   ? ?Cardiology visit went well.  Cardiac stress test completed.  Was to have had BMP rechecked to ensure she can tolerate a kidney based regimen after starting spironolactone 12.5 mg for HTN control but this hasn't yet been done - can do today. ? ?Exam ? ?BP (!) 161/68 (BP Location: Right Arm, Cuff Size: Small)   Pulse 72   Temp 98 ?F (36.7 ?C) (Oral)   Ht '4\' 11"'$  (1.499 m)   Wt 218 lb 11.2 oz (99.2 kg)   SpO2 100%   BMI 44.17 kg/m?  ? ?Positive demeanor, though appears tired. ?Chronic hump at base of cpine with cervical spinal and paraspinal tenderness, corresponds to C6-7 marked degeneration on films.  Upper traps are tender and tight, suspect secondary tension.   ? ?Assessment and plan (see also problem based documentation): ? ?Neck pain due to combination of C-S DJD and secondary paraspinal and trapezius  muscle tension.  Conservative therapy.  Heating pad, topical muscle rub.   ? ?Pregabalin 150 daily is not lasting, ineffective.  Renal function has improved enough (recheck today though) to allow for increase in dose.  I will call her  tomorrow with instructions. ? ?I'll also look into tramadol. ? ?HTN control improved with increase in amlodipine from 5 to 10 mg and with addition of spironolactone 12.5.  If today's renal fxn/K allow, will increase to 25 mg daily. ? ? ? ?High cholesterol - zetia? ? ? ?

## 2021-12-25 NOTE — Assessment & Plan Note (Signed)
LYrica 150 mg nightly has been ineffective, and she is also experiencing daytime symptoms (sudden electric jolts and needle-like stable primarily in feet, legs, hands.  Renal fxn recheck today - if allows, will increase dose of pregabalin. ?

## 2021-12-25 NOTE — Assessment & Plan Note (Addendum)
BPs improved but SBP remains 140s-160s at home - cardiologist recently added spinolactone 25 mg, 1/2 tab (12.5 mg) daily.  Will increase to 25 mg full tab daily if today's renal fxn/K allow.   ?

## 2021-12-25 NOTE — Telephone Encounter (Signed)
Called pt to f/u on requested BP readings.  Pt reports BP running high only gave SBP readings: 3/6: 154 ?3/7: 158 ?3/8:150 ?3/9:178 ?3/10: 141 ?3/11: 167 ?Pt also reports BP high at last PCP OV. ?Advised pt to increase aldactone (spironolactone) to 25 mg PO QD. F/U lab work scheduled for 01/07/22.   Pt verbalizes understanding. Orders placed.  ?

## 2021-12-25 NOTE — Assessment & Plan Note (Signed)
Chest tightness resolved spontaneously, though she did see cardiologist in interim - stress test completed, I will review results. ?

## 2021-12-25 NOTE — Assessment & Plan Note (Signed)
lantus 22 units, trulicity 1.8, still gaining weight, minimal hypoglyemic episodes.  Metformin 500 LA daily (don't increase 2/2 diarrhea) ?

## 2021-12-25 NOTE — Assessment & Plan Note (Signed)
Has been off statin for many months (stopped empirically when asymptomatic elevation of hepatocellular enzymes noted, which has since resolved).  Choices are to resume a statin (which will require ongoing liver testing) or use zetia instead.  Ill suggest the latter which will be easier for her to manage. ?

## 2021-12-25 NOTE — Telephone Encounter (Signed)
-----   Message from Werner Lean, MD sent at 11/29/2021  2:46 PM EST ----- ?Normal stress test. ?BP elevated during test. ?If AMB BP still elevated will need to increase aldactone with follow up labs. ?----- Message ----- ?From: Thayer Headings, MD ?Sent: 11/27/2021   3:10 PM EST ?To: Werner Lean, MD ? ? ?

## 2021-12-26 LAB — BMP8+ANION GAP
Anion Gap: 15 mmol/L (ref 10.0–18.0)
BUN/Creatinine Ratio: 22 (ref 12–28)
BUN: 19 mg/dL (ref 8–27)
CO2: 22 mmol/L (ref 20–29)
Calcium: 9.1 mg/dL (ref 8.7–10.3)
Chloride: 103 mmol/L (ref 96–106)
Creatinine, Ser: 0.85 mg/dL (ref 0.57–1.00)
Glucose: 105 mg/dL — ABNORMAL HIGH (ref 70–99)
Potassium: 4.3 mmol/L (ref 3.5–5.2)
Sodium: 140 mmol/L (ref 134–144)
eGFR: 71 mL/min/{1.73_m2} (ref >59)

## 2021-12-29 ENCOUNTER — Encounter: Payer: Self-pay | Admitting: Podiatry

## 2021-12-29 ENCOUNTER — Other Ambulatory Visit: Payer: Self-pay

## 2021-12-29 ENCOUNTER — Ambulatory Visit (INDEPENDENT_AMBULATORY_CARE_PROVIDER_SITE_OTHER): Payer: Medicare Other | Admitting: Podiatry

## 2021-12-29 DIAGNOSIS — E1142 Type 2 diabetes mellitus with diabetic polyneuropathy: Secondary | ICD-10-CM

## 2021-12-29 DIAGNOSIS — B351 Tinea unguium: Secondary | ICD-10-CM

## 2021-12-29 DIAGNOSIS — M79675 Pain in left toe(s): Secondary | ICD-10-CM

## 2021-12-29 DIAGNOSIS — M79674 Pain in right toe(s): Secondary | ICD-10-CM | POA: Diagnosis not present

## 2021-12-29 DIAGNOSIS — Q72899 Other reduction defects of unspecified lower limb: Secondary | ICD-10-CM

## 2021-12-29 NOTE — Progress Notes (Signed)
This patient returns to my office for at risk foot care.  This patient requires this care by a professional since this patient will be at risk due to having CKD and diabetes with PVD. 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 This patient is unable to cut nails herself since the patient cannot reach her nails.These nails are painful walking and wearing shoes.  This patient presents for at risk foot care today. ? ?General Appearance  Alert, conversant and in no acute stress. ? ?Vascular  Dorsalis pedis and posterior tibial  pulses are weakly  palpable  bilaterally.  Capillary return is within normal limits  bilaterally. Cold feet.bilaterally. ? ?Neurologic  Senn-Weinstein monofilament wire test within normal limits  bilaterally. Muscle power within normal limits bilaterally. ? ?Nails Thick disfigured discolored nails with subungual debris  from hallux to fifth toes bilaterally. No evidence of bacterial infection or drainage bilaterally. ? ?Orthopedic  No limitations of motion  feet .  No crepitus or effusions noted.  No bony pathology or digital deformities noted. 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 ? ?Skin  normotropic skin with no porokeratosis noted bilaterally.  No signs of infections or ulcers noted.    ? ?Onychomycosis  Pain in right toes  Pain in left toes ? ?Consent was obtained for treatment procedures.   Mechanical debridement of nails 1-5  bilaterally performed with a nail nipper.  Filed with dremel without incident.  ? ? ?Return office visit  3 months                    Told patient to return for periodic foot care and evaluation due to potential at risk complications. ? ? ?Gregory Mayer DPM   ?

## 2022-01-07 ENCOUNTER — Ambulatory Visit: Payer: Medicaid Other

## 2022-01-07 ENCOUNTER — Other Ambulatory Visit: Payer: Medicaid Other

## 2022-01-08 ENCOUNTER — Other Ambulatory Visit: Payer: Medicaid Other

## 2022-01-08 DIAGNOSIS — I10 Essential (primary) hypertension: Secondary | ICD-10-CM | POA: Diagnosis not present

## 2022-01-09 LAB — BASIC METABOLIC PANEL
BUN/Creatinine Ratio: 27 (ref 12–28)
BUN: 28 mg/dL — ABNORMAL HIGH (ref 8–27)
CO2: 21 mmol/L (ref 20–29)
Calcium: 9.1 mg/dL (ref 8.7–10.3)
Chloride: 105 mmol/L (ref 96–106)
Creatinine, Ser: 1.05 mg/dL — ABNORMAL HIGH (ref 0.57–1.00)
Glucose: 128 mg/dL — ABNORMAL HIGH (ref 70–99)
Potassium: 4.7 mmol/L (ref 3.5–5.2)
Sodium: 142 mmol/L (ref 134–144)
eGFR: 55 mL/min/{1.73_m2} — ABNORMAL LOW (ref >59)

## 2022-01-14 ENCOUNTER — Telehealth: Payer: Self-pay

## 2022-01-14 DIAGNOSIS — I10 Essential (primary) hypertension: Secondary | ICD-10-CM

## 2022-01-14 NOTE — Telephone Encounter (Signed)
The patient has been notified of the result and verbalized understanding.  All questions (if any) were answered. ?Precious Gilding, RN 01/14/2022 3:55 PM   ?Pt only has SBP readings ?4/5-128 ?4/4-137 ?4/3- 119 ?4/2-134 ?4/1-149 ?3/31-135 ?3/30-141 ?3/29-133 ?3/28-139 ?Scheduled pt to see HTN clinic. Will send out appointment reminder and also remind pt to bring BP cuff.  ?

## 2022-01-14 NOTE — Telephone Encounter (Signed)
-----   Message from Werner Lean, MD sent at 01/10/2022  2:32 PM EDT ----- ?Results: ?Similar creatinine ?Plan: ?Continue medication; if elevated BP will bring in with Pharm D team ? ?Werner Lean, MD ? ?

## 2022-01-16 ENCOUNTER — Ambulatory Visit: Payer: Medicaid Other

## 2022-01-22 ENCOUNTER — Other Ambulatory Visit: Payer: Self-pay

## 2022-01-22 DIAGNOSIS — E119 Type 2 diabetes mellitus without complications: Secondary | ICD-10-CM

## 2022-01-22 MED ORDER — ACCU-CHEK GUIDE VI STRP: ORAL_STRIP | 5 refills | Status: DC

## 2022-01-28 ENCOUNTER — Ambulatory Visit
Admission: RE | Admit: 2022-01-28 | Discharge: 2022-01-28 | Disposition: A | Discharge status: Home / Self Care | Payer: Medicaid Other | Source: Ambulatory Visit | Attending: Internal Medicine | Admitting: Internal Medicine

## 2022-01-28 DIAGNOSIS — Z1231 Encounter for screening mammogram for malignant neoplasm of breast: Secondary | ICD-10-CM | POA: Diagnosis not present

## 2022-02-10 NOTE — Progress Notes (Signed)
Patient ID: [name] [name]                 DOB: [birth year]                      MRN: [id number] ? ? ? ? ?HPI: ?[name] [name] is a 76 y.o. female referred by Dr. Gasper Sells to HTN clinic. PMH is significant for HTN, nonobstructive CAD with aortic atherosclerosis, HLD, DM, PAD, diastolic CHF, and obesity. BP was elevated at 151/80 at most recent visit in February and she was started on spironolactone 12.32m daily. This has since been titrated up to 258mdaily with stable BMET, however BP remained elevated and pt was referred to HTN clinic. ? ?Pt presents today for follow up. Reports tolerating medications well. Denies dizziness, headache, blurred vision, and LE edema. Occasional balance issues but nothing off from her baseline, ambulates with a cane. Walking is difficult due to hip and back pain. Watches salt and caffeine in her diet. Home systolic BP have been 12284this AM), 126, and 117 recently. Checks using bicep cuff around 8am, 30 mins after taking AM medications. ? ?Current HTN meds: atenolol 100101maily, amlodipine 21m79mily, lisinopril 20mg20mly, spironolactone 25mg 50my - all in the AM ? ?BP goal: <130/80mmHg13mFamily History: Cirrhosis in her mother; Diabetes in her father and sister; Hyperlipidemia in her sister; Hypertension in her father and sister.  ? ?Social History: Denies tobacco, alcohol and drug use. ? ?Diet: Watches her salt. Drinks decaf coffee. ? ?Exercise: Limited walking due to leg and back pain, ambulates with a cane. ? ?Home BP readings: [id number] ? ?Wt Readings from Last 3 Encounters:  ?12/25/21 218 lb 11.2 oz (99.2 kg)  ?11/27/21 216 lb (98 kg)  ?11/20/21 216 lb (98 kg)  ? ?BP Readings from Last 3 Encounters:  ?12/25/21 (!) 161/68  ?11/20/21 (!) 151/80  ?11/06/21 (!) 175/79  ? ?Pulse Readings from Last 3 Encounters:  ?12/25/21 72  ?11/20/21 86  ?11/06/21 88  ? ? ?Renal function: ?CrCl cannot be calculated (Patient's most recent lab result is older than the maximum 21 days  allowed.). ? ?Past Medical History:  ?Diagnosis Date  ? Acute hepatitis 03/14/2021  ? 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 Anemia of chronic disease   ? Arthritis   ? ASCUS with positive high risk HPV 08/03/2014  ? 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 Dr. Groat aKaty Fitch f/u, most recent 09/2019  ? CAD, non-obstructive 08/26/2006  ? Cath 07/05:multiple areas of nonobstructive disease = medical & RF mgmt, well preserved global systolic function. Dr. StuckeyLia Foyerar med stress test 01/2014 : Normal stress nuclear study. LV Ejection Fraction: 76%. LV Wall Motion: NL LV Function; NL Wall Motion.      ? Chronic neck and back pain 01/03/2013  ? Multifactorial. Non opioid requiring.   L2-5 fusion, with hardware at L2-3, stable per MRI 2010. Followed by neurosurgery (Dr. 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    ? CKD stage 2 due to type 2 diabetes mellitus (HCC) 3/[place]018  ? DEPRESSION 08/26/2006  ? On paxil    ? 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 with  macular edema OS - Dr. Groat  Katy FitchLIPIDEMIA 11/01/2006  ? Gallstones   ? GERD 08/26/2006  ? Heartburn  [facility].     ? Hx of blood transfusion without reported diagnosis   ? Hx of esophageal dysphagia requiring dilatation. 03/03/2018  ? hx of esophageal stenosis s/p dilatation   ? Hx of small intestinal bacterial overgrowth 12/24/2015  ? Presumed / clinical dx 2016. Complete resolution with metronidazole (insuance would not cover rifaximin) Flare 2017. Did not respond to ABX. Referral to GI B12 and ferritin low supporting malabsorption.   ? HYPERTENSION 08/26/2006  ? Hypertensive heart disease with chronic diastolic congestive heart failure ([place]) 05/01/2020  ? Morbid obesity with BMI of 50.0-59.9, adult ([place])   ? Personal history of colonic polyps 2020  ?  Adenomatous; f/u 2023 [facility]  ? PVD (peripheral vascular disease) ([place]), resolved per f/u ABIs 2016 03/27/2014  ? 2015 LE arterial Duplex - Possible mild inflow disease on the left. 0-49% bilateral SFA disease, without focal stenosis. Three vesel run-off, bilaterally. Medical tx.   ? S/P bilateral cataract extraction   ? Type 2 diabetes mellitus with complications ([place]) 68/12/2120  ? ? ?Current Outpatient Medications on File Prior to Visit  ?Medication Sig Dispense Refill  ? Accu-Chek FastClix Lancets MISC USE THREE TIMES DAILY. 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 amLODipine (NORVASC) 10 MG tablet Take 1 tablet (10 mg total) by mouth daily. 90 tablet 3  ? aspirin 81 MG tablet Take 1 tablet (81 mg total) by mouth daily. 90 tablet 3  ? atenolol (TENORMIN) 100 MG tablet Take 1 tablet (100 mg total) by mouth daily. 90 tablet 3  ? Blood Glucose Monitoring Suppl (ACCU-CHEK GUIDE) w/Device KIT 1 each by Does not apply route 3 (three) times daily. 1 kit 0  ? Cholecalciferol (VITAMIN D3 GUMMIES) 25 MCG (1000 UT) CHEW Chew 1 tablet (1,000 Units total) by mouth daily. 90 tablet 3  ? colestipol (COLESTID) 1 g tablet Take 1-2 tablets (1-2 g total) by mouth 2 (two) times daily as needed. 360 tablet 3  ? diclofenac Sodium (VOLTAREN) 1 % GEL Apply 2 g topically 4 (four) times daily. To painful areas of hands. 60 g 1  ? glucose blood (ACCU-CHEK GUIDE) test strip Use to test blood glucose 3 times daily. Dx E11.65 100 strip 5  ? insulin glargine (LANTUS SOLOSTAR) 100 UNIT/ML Solostar Pen Inject 22 Units into the skin at bedtime. 15 mL 5  ? Insulin Pen Needle (BD PEN NEEDLE NANO U/F) 32G X 4 MM MISC USE TO INJECT VICTOZA ONCE A DAY AND LANTUS ONCE A DAY AS DIRECTED 100 each 5  ? liraglutide (VICTOZA) 18 MG/3ML SOPN Inject 1.8 mg into the skin daily. ADMINISTER 1.2 MG UNDER THE SKIN DAILY 9 mL 3  ? lisinopril (ZESTRIL) 20 MG tablet Take 1 tablet (20 mg total) by mouth daily. 90 tablet 3  ? loratadine (CLARITIN) 10 MG  tablet TAKE 1 TABLET(10 MG) BY MOUTH DAILY 90 tablet 3  ? metFORMIN (GLUCOPHAGE-XR) 500 MG 24 hr tablet Take 1 tablet (500 mg total) by mouth in the morning and at bedtime. 180 tablet 3  ? nitroGLYCERIN (NITROSTAT) 0.4 MG SL tablet PLACE 1 TABLET UNDER THE TONGUE EVERY 5 MINUTES AS NEEDED FOR CHEST PAIN 25 tablet 0  ? pantoprazole (PROTONIX) 40 MG tablet Take 1 tablet (40 mg total) by mouth daily. 90 tablet 3  ? PARoxetine (PAXIL) 40 MG tablet Take 1 tablet (40 mg total) by mouth every morning. 90 tablet 3  ? pregabalin (LYRICA) 150 MG capsule Take 1 capsule (150 mg total) by  mouth every evening. TAKE 1 CAPSULE(150 MG) BY MOUTH DAILY 90 capsule 3  ? spironolactone (ALDACTONE) 25 MG tablet Take 1 tablet (25 mg total) by mouth daily. 90 tablet 3  ? ULTRAM 50 MG tablet Take 1 tablet (50 mg total) by mouth 2 (two) times daily as needed for moderate pain or severe pain. 60 tablet 5  ? ?No current facility-administered medications on file prior to visit.  ? ? ?No Known Allergies ? ? ?Assessment/Plan: ? ?1. Hypertension - BP at goal < 130/16mHg. Continue current meds including atenolol 1022mdaily, amlodipine 1058maily, lisinopril 72m50mily, and spironolactone 25mg12mly. Aim for < 2,000mg 53my sodium intake. F/u with PharmD as needed. ? ?[name] [name], PharmD, BCACP, CPP ?[facility] [id number] [id number]: [phone] [id number] 9:39 AM ? ? ?

## 2022-02-11 ENCOUNTER — Ambulatory Visit (INDEPENDENT_AMBULATORY_CARE_PROVIDER_SITE_OTHER): Payer: Medicare Other | Admitting: Pharmacist

## 2022-02-11 VITALS — BP 127/72 | HR 72

## 2022-02-11 DIAGNOSIS — I10 Essential (primary) hypertension: Secondary | ICD-10-CM | POA: Diagnosis not present

## 2022-02-11 NOTE — Patient Instructions (Addendum)
Your blood pressure goal is < 130/7mHg ? ?Continue taking your current medications ? ?Limit daily sodium to < 2,'000mg'$  ? ?Call clinic with any blood pressure concerns ?

## 2022-03-02 ENCOUNTER — Other Ambulatory Visit: Payer: Self-pay

## 2022-03-02 DIAGNOSIS — E118 Type 2 diabetes mellitus with unspecified complications: Secondary | ICD-10-CM

## 2022-03-02 NOTE — Telephone Encounter (Signed)
liraglutide (VICTOZA) 18 MG/3ML SOPN, refill request @ [facility] [phone] - [place], [place] AT [address].

## 2022-03-03 MED ORDER — VICTOZA 18 MG/3ML ~~LOC~~ SOPN: 1.8000 mg | PEN_INJECTOR | Freq: Every day | SUBCUTANEOUS | 3 refills | Status: DC

## 2022-03-10 ENCOUNTER — Other Ambulatory Visit: Payer: Self-pay

## 2022-03-10 DIAGNOSIS — I10 Essential (primary) hypertension: Secondary | ICD-10-CM

## 2022-03-11 MED ORDER — LORATADINE 10 MG PO TABS: ORAL_TABLET | ORAL | 3 refills | Status: DC

## 2022-03-16 ENCOUNTER — Ambulatory Visit: Payer: Medicaid Other | Admitting: Internal Medicine

## 2022-03-17 ENCOUNTER — Other Ambulatory Visit: Payer: Self-pay | Admitting: *Deleted

## 2022-03-17 DIAGNOSIS — E118 Type 2 diabetes mellitus with unspecified complications: Secondary | ICD-10-CM

## 2022-03-17 NOTE — Telephone Encounter (Signed)
Next appt scheduled 6/22 with PCP.

## 2022-03-18 MED ORDER — BD PEN NEEDLE NANO U/F 32G X 4 MM MISC: 5 refills | Status: DC

## 2022-03-26 ENCOUNTER — Encounter: Payer: Medicaid Other | Admitting: Internal Medicine

## 2022-04-02 ENCOUNTER — Encounter: Payer: Self-pay | Admitting: Internal Medicine

## 2022-04-02 ENCOUNTER — Ambulatory Visit (INDEPENDENT_AMBULATORY_CARE_PROVIDER_SITE_OTHER): Payer: Medicare Other | Admitting: Internal Medicine

## 2022-04-02 VITALS — BP 128/62 | HR 83 | Temp 98.0°F | Ht 59.0 in | Wt 219.8 lb

## 2022-04-02 DIAGNOSIS — I25118 Atherosclerotic heart disease of native coronary artery with other forms of angina pectoris: Secondary | ICD-10-CM | POA: Diagnosis not present

## 2022-04-02 DIAGNOSIS — M542 Cervicalgia: Secondary | ICD-10-CM | POA: Diagnosis not present

## 2022-04-02 DIAGNOSIS — M549 Dorsalgia, unspecified: Secondary | ICD-10-CM

## 2022-04-02 DIAGNOSIS — G252 Other specified forms of tremor: Secondary | ICD-10-CM

## 2022-04-02 DIAGNOSIS — R1319 Other dysphagia: Secondary | ICD-10-CM

## 2022-04-02 DIAGNOSIS — I110 Hypertensive heart disease with heart failure: Secondary | ICD-10-CM

## 2022-04-02 DIAGNOSIS — E559 Vitamin D deficiency, unspecified: Secondary | ICD-10-CM | POA: Diagnosis not present

## 2022-04-02 DIAGNOSIS — M159 Polyosteoarthritis, unspecified: Secondary | ICD-10-CM | POA: Diagnosis not present

## 2022-04-02 DIAGNOSIS — R2681 Unsteadiness on feet: Secondary | ICD-10-CM | POA: Diagnosis not present

## 2022-04-02 DIAGNOSIS — E118 Type 2 diabetes mellitus with unspecified complications: Secondary | ICD-10-CM

## 2022-04-02 DIAGNOSIS — G8929 Other chronic pain: Secondary | ICD-10-CM | POA: Diagnosis not present

## 2022-04-02 DIAGNOSIS — I5032 Chronic diastolic (congestive) heart failure: Secondary | ICD-10-CM

## 2022-04-02 DIAGNOSIS — E1122 Type 2 diabetes mellitus with diabetic chronic kidney disease: Secondary | ICD-10-CM | POA: Diagnosis not present

## 2022-04-02 DIAGNOSIS — I130 Hypertensive heart and chronic kidney disease with heart failure and stage 1 through stage 4 chronic kidney disease, or unspecified chronic kidney disease: Secondary | ICD-10-CM

## 2022-04-02 DIAGNOSIS — Z860100 Personal history of colon polyps, unspecified: Secondary | ICD-10-CM

## 2022-04-02 DIAGNOSIS — Z7984 Long term (current) use of oral hypoglycemic drugs: Secondary | ICD-10-CM | POA: Diagnosis not present

## 2022-04-02 DIAGNOSIS — E119 Type 2 diabetes mellitus without complications: Secondary | ICD-10-CM

## 2022-04-02 DIAGNOSIS — E113219 Type 2 diabetes mellitus with mild nonproliferative diabetic retinopathy with macular edema, unspecified eye: Secondary | ICD-10-CM

## 2022-04-02 DIAGNOSIS — E7841 Elevated Lipoprotein(a): Secondary | ICD-10-CM

## 2022-04-02 DIAGNOSIS — R490 Dysphonia: Secondary | ICD-10-CM | POA: Diagnosis not present

## 2022-04-02 DIAGNOSIS — N183 Chronic kidney disease, stage 3 unspecified: Secondary | ICD-10-CM

## 2022-04-02 DIAGNOSIS — E1142 Type 2 diabetes mellitus with diabetic polyneuropathy: Secondary | ICD-10-CM

## 2022-04-02 DIAGNOSIS — I10 Essential (primary) hypertension: Secondary | ICD-10-CM

## 2022-04-02 LAB — POCT GLYCOSYLATED HEMOGLOBIN (HGB A1C): Hemoglobin A1C: 7 % — AB (ref 4.0–5.6)

## 2022-04-02 LAB — GLUCOSE, CAPILLARY: Glucose-Capillary: 103 mg/dL — ABNORMAL HIGH (ref 70–99)

## 2022-04-02 MED ORDER — NITROGLYCERIN 0.4 MG SL SUBL: SUBLINGUAL_TABLET | SUBLINGUAL | 0 refills | Status: DC

## 2022-04-02 MED ORDER — ULTRAM 50 MG PO TABS: 50.0000 mg | ORAL_TABLET | Freq: Two times a day (BID) | ORAL | 5 refills | Status: DC | PRN

## 2022-04-02 MED ORDER — VICTOZA 18 MG/3ML ~~LOC~~ SOPN: 1.8000 mg | PEN_INJECTOR | Freq: Every day | SUBCUTANEOUS | 3 refills | Status: DC

## 2022-04-02 MED ORDER — ACCU-CHEK GUIDE VI STRP: ORAL_STRIP | 5 refills | Status: DC

## 2022-04-02 MED ORDER — VITAMIN D3 GUMMIES 25 MCG (1000 UT) PO CHEW: 2000.0000 [IU] | CHEWABLE_TABLET | Freq: Every day | ORAL | 3 refills | Status: AC

## 2022-04-02 NOTE — Assessment & Plan Note (Signed)
Dysphagia has returned, feels food get "hung up" in chest, happens every now and then.  Still having burning heartburn and bilious (?) belching.  Worse at night.  She is not yet ready for a dilatation procedure.

## 2022-04-02 NOTE — Assessment & Plan Note (Signed)
Takiong 1000 units daily.  WIll increase to 2 daily, recheck vit D at next 73M visit.

## 2022-04-02 NOTE — Assessment & Plan Note (Signed)
Due for surveillance - will refer for appt

## 2022-04-02 NOTE — Patient Instructions (Signed)
[name] [name],  Always great to see you!  We talked about referral to physical therapy, to come to your house and work on balance, flexibility, and strengthening, which can help with your arthritis pain.  Please ask your podiatrist about the new device you saw advertised for neuropathy pain!  I'm interested, too.  The only change in your medicine today is to begin taking 2 vitamin D pills each day instead of 1.  We'll recheck your vitamin D level next time.  Your blood pressure looks terrific, and your A1c is just fine at 7.  Keep up the good work!  Take care and stay well,  Dr. Jimmye Norman

## 2022-04-02 NOTE — Assessment & Plan Note (Signed)
One low of 68 fasting, symptomatic, knows to eat and sxs resolve.  A1C 7 today.  Taking metformin twice a day.  No plan to increase based on chronic diarrhea.

## 2022-04-02 NOTE — Assessment & Plan Note (Signed)
Interested in pursuing a new non-pharm neuromodulator device advertised at podiatrist.  [facility] will visit with them at upcoming appt next week. Sxs uncontrolled on pregabalin.

## 2022-04-02 NOTE — Progress Notes (Unsigned)
Medicaid [place] Access - won't refill her tramadol  Suffering with burning neuriopathy, inquires about a pamphlet at jpodiatrist for Nevro HFX nonpharm treatment, for diabetic neuropathy.  Appt next Monday, she will inquire.   Needs bus pass forms completed.    Neck kink, she will stretch, massage, apply heating pad.

## 2022-04-03 LAB — BMP8+ANION GAP
Anion Gap: 20 mmol/L — ABNORMAL HIGH (ref 10.0–18.0)
BUN/Creatinine Ratio: 28 (ref 12–28)
BUN: 26 mg/dL (ref 8–27)
CO2: 18 mmol/L — ABNORMAL LOW (ref 20–29)
Calcium: 9.6 mg/dL (ref 8.7–10.3)
Chloride: 109 mmol/L — ABNORMAL HIGH (ref 96–106)
Creatinine, Ser: 0.94 mg/dL (ref 0.57–1.00)
Glucose: 107 mg/dL — ABNORMAL HIGH (ref 70–99)
Potassium: 5.3 mmol/L — ABNORMAL HIGH (ref 3.5–5.2)
Sodium: 147 mmol/L — ABNORMAL HIGH (ref 134–144)
eGFR: 62 mL/min/{1.73_m2} (ref >59)

## 2022-04-06 ENCOUNTER — Encounter: Payer: Self-pay | Admitting: Podiatry

## 2022-04-06 ENCOUNTER — Other Ambulatory Visit: Payer: Self-pay

## 2022-04-06 ENCOUNTER — Ambulatory Visit (INDEPENDENT_AMBULATORY_CARE_PROVIDER_SITE_OTHER): Payer: Medicare Other | Admitting: Podiatry

## 2022-04-06 DIAGNOSIS — B351 Tinea unguium: Secondary | ICD-10-CM

## 2022-04-06 DIAGNOSIS — M79675 Pain in left toe(s): Secondary | ICD-10-CM

## 2022-04-06 DIAGNOSIS — M79674 Pain in right toe(s): Secondary | ICD-10-CM

## 2022-04-06 DIAGNOSIS — E1142 Type 2 diabetes mellitus with diabetic polyneuropathy: Secondary | ICD-10-CM

## 2022-04-06 DIAGNOSIS — M792 Neuralgia and neuritis, unspecified: Secondary | ICD-10-CM | POA: Diagnosis not present

## 2022-04-06 DIAGNOSIS — L84 Corns and callosities: Secondary | ICD-10-CM

## 2022-04-06 DIAGNOSIS — E1140 Type 2 diabetes mellitus with diabetic neuropathy, unspecified: Secondary | ICD-10-CM

## 2022-04-06 MED ORDER — PREGABALIN 150 MG PO CAPS: 150.0000 mg | ORAL_CAPSULE | Freq: Every evening | ORAL | 3 refills | Status: DC

## 2022-04-06 MED ORDER — ROSUVASTATIN CALCIUM 5 MG PO TABS: 5.0000 mg | ORAL_TABLET | Freq: Every day | ORAL | 11 refills | Status: DC

## 2022-04-06 NOTE — Assessment & Plan Note (Signed)
Hx of increased liver enzymes, asymptomatic, resolved upon cessation of statin, though it is not clear if it was causative.  She agrees to restart at a low dose; we will monitor her liver tests.

## 2022-04-06 NOTE — Assessment & Plan Note (Signed)
Renal fxn had improved as of last visit; recheck today upon increase in spironolactone from 12.5 to 25 mg daily.

## 2022-04-13 NOTE — Progress Notes (Signed)
  Subjective:  Patient ID: [name] [name], female    DOB: [birth year],  MRN: [id number]  [name] [name] presents to clinic today for at risk foot care with history of diabetic neuropathy and callus(es) b/l lower extremities and painful thick toenails that are difficult to trim. Painful toenails interfere with ambulation. Aggravating factors include wearing enclosed shoe gear. Pain is relieved with periodic professional debridement. Painful calluses are aggravated when weightbearing with and without shoegear. Pain is relieved with periodic professional debridement.  Patient states blood glucose was 155 mg/dl today.  Last known HgA1c was unknown.    New problem(s): She states she is having more neuropathy pain lately.  PCP is Angelica Pou, MD , and last visit was April 02, 2022.  No Known Allergies  Review of Systems: Negative except as noted in the HPI.  Objective: No changes noted in today's physical examination. Constitutional Patient is a pleasant 76 y.o. African American female obese in NAD. AAO x 3.  Vascular Capillary fill time to digits immediate b/l.Palpable DP pulse(s) b/l LE. Nonpalpable PT pulse(s) b/l LE. Pedal hair sparse. No pain with calf compression b/l. No edema noted b/l LE. No ischemia or gangrene noted b/l LE. No cyanosis or clubbing noted b/l LE.  Neurologic Pt has subjective symptoms of neuropathy. Protective sensation diminished with 10g monofilament b/l. Vibratory sensation intact b/l. Proprioception intact bilaterally.  Dermatologic Pedal integument with normal turgor, texture and tone b/l LE. No open wounds b/l. No interdigital macerations b/l. Toenails 1-5 b/l elongated, thickened, discolored with subungual debris. +Tenderness with dorsal palpation of nailplates. Hyperkeratotic lesion(s) noted submet head 1 b/l and submet head 5 b/l.  No erythema, no edema, no drainage, no fluctuance.  Orthopedic: Muscle strength 5/5 to all lower extremity muscle groups  bilaterally. 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 No pain, crepitus or joint limitation noted with ROM bilateral LE. Pes planus deformity noted bilateral LE.      Latest Ref Rng & Units 04/02/2022    9:22 AM 11/06/2021   11:08 AM 06/19/2021   10:35 AM  Hemoglobin A1C  Hemoglobin-A1c 4.0 - 5.6 % 7.0  6.6  7.2    Assessment/Plan: 1. Pain due to onychomycosis of toenails of both feet   2. Callus   3. Neuropathic pain   4. Diabetic peripheral neuropathy associated with type 2 diabetes mellitus ([place])     -Consent given for treatment as described below: -Recommended OTC Nervive Neuropathy Cream. Apply to feet before bedtime. -Medicaid ABN on file for paring of calluses. -Toenails 1-5 b/l were debrided in length and girth with sterile nail nippers and dremel without iatrogenic bleeding.  -Callus(es) submet head 1 b/l and submet head 5 b/l pared utilizing sterile scalpel blade without complication or incident. Total number debrided =4. -Patient/POA to call should there be question/concern in the interim.   Return in about 3 months (around 07/07/2022).  Marzetta Board, DPM

## 2022-04-18 DIAGNOSIS — Z9181 History of falling: Secondary | ICD-10-CM | POA: Diagnosis not present

## 2022-04-18 DIAGNOSIS — Z7982 Long term (current) use of aspirin: Secondary | ICD-10-CM | POA: Diagnosis not present

## 2022-04-18 DIAGNOSIS — M542 Cervicalgia: Secondary | ICD-10-CM | POA: Diagnosis not present

## 2022-04-18 DIAGNOSIS — N183 Chronic kidney disease, stage 3 unspecified: Secondary | ICD-10-CM | POA: Diagnosis not present

## 2022-04-18 DIAGNOSIS — E1142 Type 2 diabetes mellitus with diabetic polyneuropathy: Secondary | ICD-10-CM | POA: Diagnosis not present

## 2022-04-18 DIAGNOSIS — I5032 Chronic diastolic (congestive) heart failure: Secondary | ICD-10-CM | POA: Diagnosis not present

## 2022-04-18 DIAGNOSIS — Z79891 Long term (current) use of opiate analgesic: Secondary | ICD-10-CM | POA: Diagnosis not present

## 2022-04-18 DIAGNOSIS — M150 Primary generalized (osteo)arthritis: Secondary | ICD-10-CM | POA: Diagnosis not present

## 2022-04-18 DIAGNOSIS — I2510 Atherosclerotic heart disease of native coronary artery without angina pectoris: Secondary | ICD-10-CM | POA: Diagnosis not present

## 2022-04-18 DIAGNOSIS — Z7984 Long term (current) use of oral hypoglycemic drugs: Secondary | ICD-10-CM | POA: Diagnosis not present

## 2022-04-18 DIAGNOSIS — Z794 Long term (current) use of insulin: Secondary | ICD-10-CM | POA: Diagnosis not present

## 2022-04-18 DIAGNOSIS — E785 Hyperlipidemia, unspecified: Secondary | ICD-10-CM | POA: Diagnosis not present

## 2022-04-18 DIAGNOSIS — E559 Vitamin D deficiency, unspecified: Secondary | ICD-10-CM | POA: Diagnosis not present

## 2022-04-18 DIAGNOSIS — G8929 Other chronic pain: Secondary | ICD-10-CM | POA: Diagnosis not present

## 2022-04-18 DIAGNOSIS — E113219 Type 2 diabetes mellitus with mild nonproliferative diabetic retinopathy with macular edema, unspecified eye: Secondary | ICD-10-CM | POA: Diagnosis not present

## 2022-04-18 DIAGNOSIS — I110 Hypertensive heart disease with heart failure: Secondary | ICD-10-CM | POA: Diagnosis not present

## 2022-04-18 DIAGNOSIS — E1122 Type 2 diabetes mellitus with diabetic chronic kidney disease: Secondary | ICD-10-CM | POA: Diagnosis not present

## 2022-05-04 ENCOUNTER — Encounter: Payer: Self-pay | Admitting: Gastroenterology

## 2022-05-12 ENCOUNTER — Encounter: Payer: Self-pay | Admitting: Dietician

## 2022-05-15 ENCOUNTER — Telehealth: Payer: Self-pay | Admitting: Internal Medicine

## 2022-05-15 DIAGNOSIS — I2510 Atherosclerotic heart disease of native coronary artery without angina pectoris: Secondary | ICD-10-CM | POA: Diagnosis not present

## 2022-05-15 DIAGNOSIS — M150 Primary generalized (osteo)arthritis: Secondary | ICD-10-CM | POA: Diagnosis not present

## 2022-05-15 DIAGNOSIS — I5032 Chronic diastolic (congestive) heart failure: Secondary | ICD-10-CM | POA: Diagnosis not present

## 2022-05-15 DIAGNOSIS — Z9181 History of falling: Secondary | ICD-10-CM | POA: Diagnosis not present

## 2022-05-15 DIAGNOSIS — E1122 Type 2 diabetes mellitus with diabetic chronic kidney disease: Secondary | ICD-10-CM | POA: Diagnosis not present

## 2022-05-15 DIAGNOSIS — G8929 Other chronic pain: Secondary | ICD-10-CM | POA: Diagnosis not present

## 2022-05-15 DIAGNOSIS — E559 Vitamin D deficiency, unspecified: Secondary | ICD-10-CM | POA: Diagnosis not present

## 2022-05-15 DIAGNOSIS — Z7982 Long term (current) use of aspirin: Secondary | ICD-10-CM | POA: Diagnosis not present

## 2022-05-15 DIAGNOSIS — Z794 Long term (current) use of insulin: Secondary | ICD-10-CM | POA: Diagnosis not present

## 2022-05-15 DIAGNOSIS — Z7984 Long term (current) use of oral hypoglycemic drugs: Secondary | ICD-10-CM | POA: Diagnosis not present

## 2022-05-15 DIAGNOSIS — N183 Chronic kidney disease, stage 3 unspecified: Secondary | ICD-10-CM | POA: Diagnosis not present

## 2022-05-15 DIAGNOSIS — E1142 Type 2 diabetes mellitus with diabetic polyneuropathy: Secondary | ICD-10-CM | POA: Diagnosis not present

## 2022-05-15 DIAGNOSIS — E113219 Type 2 diabetes mellitus with mild nonproliferative diabetic retinopathy with macular edema, unspecified eye: Secondary | ICD-10-CM | POA: Diagnosis not present

## 2022-05-15 DIAGNOSIS — I110 Hypertensive heart disease with heart failure: Secondary | ICD-10-CM | POA: Diagnosis not present

## 2022-05-15 DIAGNOSIS — M542 Cervicalgia: Secondary | ICD-10-CM | POA: Diagnosis not present

## 2022-05-15 DIAGNOSIS — Z79891 Long term (current) use of opiate analgesic: Secondary | ICD-10-CM | POA: Diagnosis not present

## 2022-05-15 DIAGNOSIS — E785 Hyperlipidemia, unspecified: Secondary | ICD-10-CM | POA: Diagnosis not present

## 2022-05-15 NOTE — Telephone Encounter (Signed)
[name] [name] with [facility] called to inform Dr. Jimmye Norman that patient  missed physical therapy on 07/17 and 07/24. But make up date will be 08/14.

## 2022-05-18 NOTE — Progress Notes (Unsigned)
Cardiology Office Note:    Date:  05/19/2022   ID:  [name] [name], [place] 08/07/1946, MRN [id number]  PCP:  Angelica Pou, MD   [facility] Cardiologist:  Ena Dawley, MD     Referring MD: Angelica Pou, MD   CC: Chest tightness and CAD f/u  History of Present Illness:    [name] [name] is a 76 y.o. female with a hx of non obstructive CAD,  HLD with DM and aortic atherosclerosis, Morbid obesity, Mild AI, Mild MR, Mild TR, PAD with SFA disease for medical mgmt. 2023: Normal Lexiscan, BP elevated; with titration medications improved to normal.  Patient notes that she is doing fine.   Since last visit notes minimal activity and largely sedentary life; does house work without issues. . There are no interval hospital/ED visit.    Notes rare chest tightness, last was last week.  Was very mild and did not need nitroglycerin.  Minimal SOB and DOE and no PND/Orthopnea.  No weight gain; mild  leg swelling.  No palpitations or syncope.  Overall she feels good.   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 Katy Fitch annual f/u, most recent 09/2019   CAD, non-obstructive 08/26/2006   Cath 07/05:multiple areas of nonobstructive disease = medical & RF mgmt, well preserved global systolic function. Dr. Lia Foyer. Nuclear med stress test 01/2014 : Normal stress nuclear study. LV Ejection Fraction: 76%. LV Wall Motion: NL LV Function; NL Wall Motion.       Chronic neck and back pain 01/03/2013   Multifactorial. Non opioid requiring.   L2-5 fusion, with hardware at L2-3, stable per MRI 2010. Followed by neurosurgery (Dr. 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     CKD stage 2 due to type 2  diabetes mellitus ([place]) 12/11/2016   DEPRESSION 08/26/2006   On paxil     Diabetic peripheral neuropathy associated with type 2 diabetes mellitus ([place])    Diabetic retinopathy associated with controlled type 2 diabetes mellitus ([place]) 09/2019   with  macular edema OS - Dr. Katy Fitch   DYSLIPIDEMIA 11/01/2006   Gallstones    GERD 08/26/2006   Heartburn QHS.      Hx of blood transfusion without reported diagnosis    Hx of esophageal dysphagia requiring dilatation. 03/03/2018   hx of esophageal stenosis s/p dilatation    Hx of small intestinal bacterial overgrowth 12/24/2015   Presumed / clinical dx 2016. Complete resolution with metronidazole (insuance would not cover rifaximin) Flare 2017. Did not respond to ABX. Referral to GI B12 and ferritin low supporting malabsorption.    HYPERTENSION 08/26/2006   Hypertensive heart disease with chronic diastolic congestive heart failure ([employer]) 05/01/2020   Morbid obesity with BMI of 50.0-59.9, adult ([place])    Personal history of colonic polyps 2020   Adenomatous; f/u 2023 [facility]   PVD (peripheral vascular disease) ([place]), resolved per f/u ABIs 2016 03/27/2014   2015 LE arterial Duplex - Possible mild inflow disease on the left. 0-49% bilateral SFA disease, without focal stenosis. Three vesel run-off, bilaterally. Medical tx.    S/P bilateral cataract extraction    Type 2 diabetes mellitus with complications ([place]) 10/62/6948    Past  Surgical History:  Procedure Laterality Date   CATARACT EXTRACTION, BILATERAL     CHOLECYSTECTOMY     pt denies 04/07/16   COLONOSCOPY     endometrial biospy     ESOPHAGEAL DILATION     LEFT HEART CATH     lumbar fusion surgery  10/08   L3-L5   POLYPECTOMY     posterior lumbar interfusion surgery  07/18/07   L2-L3   rotator cuff surgery Bilateral     Current Medications: Current Meds  Medication Sig   Accu-Chek FastClix Lancets MISC USE THREE TIMES DAILY. DIAG CODE E11.51 INSULIN DEPENDENT   amLODipine (NORVASC)  5 MG tablet Take 1 tablet (5 mg total) by mouth daily.   aspirin 81 MG tablet Take 1 tablet (81 mg total) by mouth daily.   atenolol (TENORMIN) 100 MG tablet Take 1 tablet (100 mg total) by mouth daily.   Blood Glucose Monitoring Suppl (ACCU-CHEK GUIDE) w/Device KIT 1 each by Does not apply route 3 (three) times daily.   Cholecalciferol (VITAMIN D3 GUMMIES) 25 MCG (1000 UT) CHEW Chew 2 tablets (2,000 Units total) by mouth daily.   colestipol (COLESTID) 1 g tablet Take 1-2 tablets (1-2 g total) by mouth 2 (two) times daily as needed.   diclofenac Sodium (VOLTAREN) 1 % GEL Apply 2 g topically 4 (four) times daily. To painful areas of hands.   glucose blood (ACCU-CHEK GUIDE) test strip Use to test blood glucose 3 times daily. Dx P10.31RXY to test blood glucose 3 times daily. Dx E11.65   insulin glargine (LANTUS SOLOSTAR) 100 UNIT/ML Solostar Pen Inject 22 Units into the skin at bedtime.   Insulin Pen Needle (BD PEN NEEDLE NANO U/F) 32G X 4 MM MISC USE TO INJECT VICTOZA ONCE A DAY AND LANTUS ONCE A DAY AS DIRECTED   liraglutide (VICTOZA) 18 MG/3ML SOPN Inject 1.8 mg into the skin daily.   lisinopril (ZESTRIL) 20 MG tablet Take 1 tablet (20 mg total) by mouth daily.   loratadine (CLARITIN) 10 MG tablet TAKE 1 TABLET(10 MG) BY MOUTH DAILY   metFORMIN (GLUCOPHAGE-XR) 500 MG 24 hr tablet Take 1 tablet (500 mg total) by mouth in the morning and at bedtime.   nitroGLYCERIN (NITROSTAT) 0.4 MG SL tablet PLACE 1 TABLET UNDER THE TONGUE EVERY 5 MINUTES AS NEEDED FOR CHEST PAIN   pantoprazole (PROTONIX) 40 MG tablet Take 1 tablet (40 mg total) by mouth daily.   PARoxetine (PAXIL) 40 MG tablet Take 1 tablet (40 mg total) by mouth every morning.   pregabalin (LYRICA) 150 MG capsule Take 1 capsule (150 mg total) by mouth every evening. TAKE 1 CAPSULE(150 MG) BY MOUTH DAILY   rosuvastatin (CRESTOR) 10 MG tablet Take 1 tablet (10 mg total) by mouth daily.   spironolactone (ALDACTONE) 25 MG tablet Take 1 tablet (25 mg  total) by mouth daily.   ULTRAM 50 MG tablet Take 1 tablet (50 mg total) by mouth 2 (two) times daily as needed for moderate pain or severe pain.   [DISCONTINUED] amLODipine (NORVASC) 10 MG tablet Take 1 tablet (10 mg total) by mouth daily.   [DISCONTINUED] rosuvastatin (CRESTOR) 5 MG tablet Take 1 tablet (5 mg total) by mouth daily.     Allergies:   Patient has no known allergies.   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 Has two daughters and several grand kids. Severe limitation in ambulation. Occ goes out to church but usually daughter gets groceries.    She is one of 7 kids and the oldest so helped raise the other 6.   Social Determinants of Health   Financial Resource Strain: Low Risk  (06/08/2019)   Overall Financial Resource Strain (CARDIA)    Difficulty of Paying Living Expenses: Not hard at all  Food Insecurity: No Food Insecurity (11/18/2021)   Hunger Vital Sign    Worried About Running Out of Food in the Last Year: Never true    Ran Out of Food in the Last Year: Never true  Transportation Needs: No Transportation Needs (11/18/2021)   PRAPARE - Hydrologist (Medical): No    Lack of Transportation (Non-Medical): No  Physical Activity: Insufficiently Active (06/08/2019)   Exercise Vital Sign    Days of Exercise per Week: 3 days    Minutes of Exercise per Session: 20 min  Stress:  Stress Concern Present (06/08/2019)   [place]    Feeling of Stress : To some extent  Social Connections: Not on file     Family History: The patient's family history includes Cirrhosis in her mother; Diabetes in her father and sister; Hyperlipidemia in her sister; Hypertension in her father and sister. There is no history of Colon cancer, Throat cancer, Esophageal cancer, Rectal cancer, Stomach cancer, or Colon polyps.  ROS:   Please see the history of present illness.     All other systems reviewed and are negative.  EKGs/Labs/Other Studies Reviewed:    The following studies were reviewed today:  EKG:   11/20/21: SR rate 86   GATED SPECT MYO PERF W/LEXISCAN STRESS 1D 11/27/2021  Narrative   The study is normal. The study is low risk.   Left ventricular function is normal. Nuclear stress EF: 73 %. The left ventricular ejection fraction is hyperdynamic (>65%). End diastolic cavity size is normal.   Prior study available for comparison from 12/12/2019.   ECHO COMPLETE WO IMAGING ENHANCING AGENT 03/16/2021 1. Left ventricular ejection fraction, by estimation, is 55 to 60%. The left ventricle has normal function. The left ventricle has no regional wall motion abnormalities. Left ventricular diastolic parameters are indeterminate. 2. Right ventricular systolic function is normal. The right ventricular size is normal. There is normal pulmonary artery systolic pressure. The estimated right ventricular systolic pressure is 09.7 mmHg. 3. The mitral valve is grossly normal. Mild mitral valve regurgitation. 4. The aortic valve is tricuspid. Aortic valve regurgitation is mild. 5. The inferior vena cava is normal in size with greater than 50% respiratory variability, suggesting right atrial pressure of 3 mmHg.  Recent Labs: 11/06/2021: Hemoglobin 12.1; Platelets 309 04/02/2022: BUN 26; Creatinine, Ser 0.94; Potassium 5.3; Sodium 147   Recent Lipid Panel    Component Value Date/Time   CHOL 203 (H) 11/06/2021 1204  TRIG 102 11/06/2021 1204   HDL 74 11/06/2021 1204   CHOLHDL 2.7 11/06/2021 1204   CHOLHDL 1.9 12/30/2015 0832   VLDL 12 12/30/2015 0832   LDLCALC 111 (H) 11/06/2021 1204   \     Physical Exam:    VS:  BP 119/73   Pulse 83   Ht '5\' 3"'  (1.6 m)   Wt 219 lb 6.4 oz (99.5 kg)   SpO2 98%   BMI 38.86 kg/m     Wt Readings from Last 3 Encounters:  05/19/22 219 lb 6.4 oz (99.5 kg)  04/02/22 219 lb 12.8 oz (99.7 kg)  12/25/21 218 lb 11.2 oz (99.2 kg)    Gen: no distress, morbid obesity   Neck: No JVD, R carotid bruit Cardiac: No Rubs or Gallops, soft diastolic heart murmur, RRR +2 radial pulses Respiratory: Clear to auscultation bilaterally, normal effort, normal  respiratory rate GI: Soft, nontender, non-distended  MS: +1 bilateral pitting  edema;  moves all extremities Integument: Skin feels warm Neuro:  At time of evaluation, alert and oriented to person/place/time/situation  Psych: Normal affect, patient feels warm   ASSESSMENT:    1. Diabetes mellitus with coincident hypertension ([place])   2. Coronary artery disease involving native coronary artery of native heart without angina pectoris   3. Bruit of right carotid artery   4. Hyperlipidemia associated with type 2 diabetes mellitus ([place])   5. Aortic atherosclerosis ([place])   6. PAD (peripheral artery disease) ([place])   7. Nonrheumatic aortic valve insufficiency     PLAN:    Moderate non obstructive CAD HTN with DM HLD with DM Aortic atherosclerosis PAD without claudication CKD Stage IIIa Complicated by leg swelling and right carotid bruit - ASA 81 mg PO Daily - AMB BP 113/83 - LDL 111; increase rosuvastatin to 10 mg and labs in three months - Continue atenolol - will decrease Norvasc to 5 mg; if BP is elevated we will start IMDUR - continue lisinopril - low threshold to start PO lasix if no improvement with leg swelling - compression  stockings next intervention (with her CKD) - patient can take PRN nitro if need for CP  Mild AI, TR, and MR - last echo 2022 and stable, 2025 unless new issues  2024 with APP 2025 wit Me post Echo   Medication Adjustments/Labs and Tests Ordered: Current medicines are reviewed at length with the patient today.  Concerns regarding medicines are outlined above.  Orders Placed This Encounter  Procedures   ALT   Lipid panel   VAS US CAROTID   Meds ordered this encounter  Medications   amLODipine (NORVASC) 5 MG tablet    Sig: Take 1 tablet (5 mg total) by mouth daily.    Dispense:  90 tablet    Refill:  3   rosuvastatin (CRESTOR) 10 MG tablet    Sig: Take 1 tablet (10 mg total) by mouth daily.    Dispense:  90 tablet    Refill:  3    Patient Instructions  Medication Instructions:  Your physician has recommended you make the following change in your medication:  DECREASE: amlodipine (Norvasc) to 5 mg by mouth once daily INCREASE: rosuvastatin (Crestor) to 10 mg by mouth once daily *If you need a refill on your cardiac medications before your next appointment, please call your pharmacy*   Lab Work: IN 3 MONTHS: FLP, ALT (nothing to eat or drink 8 hours before except water and black coffee)   If you have labs (blood  work) drawn today and your tests are completely normal, you will receive your results only by: [place] (if you have MyChart) OR A paper copy in the mail If you have any lab test that is abnormal or we need to change your treatment, we will call you to review the results.   Testing/Procedures: Your physician has requested that you have a carotid duplex. This test is an ultrasound of the carotid arteries in your neck. It looks at blood flow through these arteries that supply the brain with blood. Allow one hour for this exam. There are no restrictions or special instructions.     Follow-Up: At [facility], you and your health needs are our priority.   As part of our continuing mission to provide you with exceptional heart care, we have created designated Provider Care Teams.  These Care Teams include your primary Cardiologist (physician) and Advanced Practice Providers (APPs -  Physician Assistants and Nurse Practitioners) who all work together to provide you with the care you need, when you need it.  We recommend signing up for the patient portal called "MyChart".  Sign up information is provided on this After Visit Summary.  MyChart is used to connect with patients for Virtual Visits (Telemedicine).  Patients are able to view lab/test results, encounter notes, upcoming appointments, etc.  Non-urgent messages can be sent to your provider as well.   To learn more about what you can do with MyChart, go to [email or web address].    Your next appointment:   1 year(s)  The format for your next appointment:   In Person  Provider:   Melina Copa, PA-C, Ermalinda Barrios, PA-C, or Christen Bame, NP     Then, Rudean Haskell, MD will plan to see you again in 2 year(s).   Important Information About Sugar         Signed, Werner Lean, MD  05/19/2022 11:58 AM    [facility]

## 2022-05-19 ENCOUNTER — Encounter: Payer: Self-pay | Admitting: Internal Medicine

## 2022-05-19 ENCOUNTER — Ambulatory Visit (INDEPENDENT_AMBULATORY_CARE_PROVIDER_SITE_OTHER): Payer: Medicare Other | Admitting: Internal Medicine

## 2022-05-19 VITALS — BP 119/73 | HR 83 | Ht 63.0 in | Wt 219.4 lb

## 2022-05-19 DIAGNOSIS — N183 Chronic kidney disease, stage 3 unspecified: Secondary | ICD-10-CM | POA: Diagnosis not present

## 2022-05-19 DIAGNOSIS — I351 Nonrheumatic aortic (valve) insufficiency: Secondary | ICD-10-CM | POA: Diagnosis not present

## 2022-05-19 DIAGNOSIS — E1169 Type 2 diabetes mellitus with other specified complication: Secondary | ICD-10-CM

## 2022-05-19 DIAGNOSIS — Z9181 History of falling: Secondary | ICD-10-CM | POA: Diagnosis not present

## 2022-05-19 DIAGNOSIS — I2510 Atherosclerotic heart disease of native coronary artery without angina pectoris: Secondary | ICD-10-CM

## 2022-05-19 DIAGNOSIS — E113219 Type 2 diabetes mellitus with mild nonproliferative diabetic retinopathy with macular edema, unspecified eye: Secondary | ICD-10-CM | POA: Diagnosis not present

## 2022-05-19 DIAGNOSIS — I739 Peripheral vascular disease, unspecified: Secondary | ICD-10-CM | POA: Diagnosis not present

## 2022-05-19 DIAGNOSIS — Z794 Long term (current) use of insulin: Secondary | ICD-10-CM | POA: Diagnosis not present

## 2022-05-19 DIAGNOSIS — E119 Type 2 diabetes mellitus without complications: Secondary | ICD-10-CM | POA: Diagnosis not present

## 2022-05-19 DIAGNOSIS — Z7984 Long term (current) use of oral hypoglycemic drugs: Secondary | ICD-10-CM | POA: Diagnosis not present

## 2022-05-19 DIAGNOSIS — Z79891 Long term (current) use of opiate analgesic: Secondary | ICD-10-CM | POA: Diagnosis not present

## 2022-05-19 DIAGNOSIS — E1122 Type 2 diabetes mellitus with diabetic chronic kidney disease: Secondary | ICD-10-CM | POA: Diagnosis not present

## 2022-05-19 DIAGNOSIS — I5032 Chronic diastolic (congestive) heart failure: Secondary | ICD-10-CM | POA: Diagnosis not present

## 2022-05-19 DIAGNOSIS — E1142 Type 2 diabetes mellitus with diabetic polyneuropathy: Secondary | ICD-10-CM | POA: Diagnosis not present

## 2022-05-19 DIAGNOSIS — I700 Atherosclerosis of aorta: Secondary | ICD-10-CM

## 2022-05-19 DIAGNOSIS — E559 Vitamin D deficiency, unspecified: Secondary | ICD-10-CM | POA: Diagnosis not present

## 2022-05-19 DIAGNOSIS — R0989 Other specified symptoms and signs involving the circulatory and respiratory systems: Secondary | ICD-10-CM | POA: Diagnosis not present

## 2022-05-19 DIAGNOSIS — I10 Essential (primary) hypertension: Secondary | ICD-10-CM

## 2022-05-19 DIAGNOSIS — M542 Cervicalgia: Secondary | ICD-10-CM | POA: Diagnosis not present

## 2022-05-19 DIAGNOSIS — E785 Hyperlipidemia, unspecified: Secondary | ICD-10-CM | POA: Diagnosis not present

## 2022-05-19 DIAGNOSIS — M150 Primary generalized (osteo)arthritis: Secondary | ICD-10-CM | POA: Diagnosis not present

## 2022-05-19 DIAGNOSIS — G8929 Other chronic pain: Secondary | ICD-10-CM | POA: Diagnosis not present

## 2022-05-19 DIAGNOSIS — Z7982 Long term (current) use of aspirin: Secondary | ICD-10-CM | POA: Diagnosis not present

## 2022-05-19 DIAGNOSIS — I110 Hypertensive heart disease with heart failure: Secondary | ICD-10-CM | POA: Diagnosis not present

## 2022-05-19 MED ORDER — ROSUVASTATIN CALCIUM 10 MG PO TABS: 10.0000 mg | ORAL_TABLET | Freq: Every day | ORAL | 3 refills | Status: DC

## 2022-05-19 MED ORDER — AMLODIPINE BESYLATE 5 MG PO TABS: 5.0000 mg | ORAL_TABLET | Freq: Every day | ORAL | 3 refills | Status: DC

## 2022-05-19 NOTE — Patient Instructions (Signed)
Medication Instructions:  Your physician has recommended you make the following change in your medication:  DECREASE: amlodipine (Norvasc) to 5 mg by mouth once daily INCREASE: rosuvastatin (Crestor) to 10 mg by mouth once daily *If you need a refill on your cardiac medications before your next appointment, please call your pharmacy*   Lab Work: IN 3 MONTHS: FLP, ALT (nothing to eat or drink 8 hours before except water and black coffee)   If you have labs (blood work) drawn today and your tests are completely normal, you will receive your results only by: [place] (if you have MyChart) OR A paper copy in the mail If you have any lab test that is abnormal or we need to change your treatment, we will call you to review the results.   Testing/Procedures: Your physician has requested that you have a carotid duplex. This test is an ultrasound of the carotid arteries in your neck. It looks at blood flow through these arteries that supply the brain with blood. Allow one hour for this exam. There are no restrictions or special instructions.     Follow-Up: At [facility], you and your health needs are our priority.  As part of our continuing mission to provide you with exceptional heart care, we have created designated Provider Care Teams.  These Care Teams include your primary Cardiologist (physician) and Advanced Practice Providers (APPs -  Physician Assistants and Nurse Practitioners) who all work together to provide you with the care you need, when you need it.  We recommend signing up for the patient portal called "MyChart".  Sign up information is provided on this After Visit Summary.  MyChart is used to connect with patients for Virtual Visits (Telemedicine).  Patients are able to view lab/test results, encounter notes, upcoming appointments, etc.  Non-urgent messages can be sent to your provider as well.   To learn more about what you can do with MyChart, go to  [email or web address].    Your next appointment:   1 year(s)  The format for your next appointment:   In Person  Provider:   Melina Copa, PA-C, Ermalinda Barrios, PA-C, or Christen Bame, NP     Then, Rudean Haskell, MD will plan to see you again in 2 year(s).   Important Information About Sugar

## 2022-05-27 DIAGNOSIS — M542 Cervicalgia: Secondary | ICD-10-CM | POA: Diagnosis not present

## 2022-05-27 DIAGNOSIS — M150 Primary generalized (osteo)arthritis: Secondary | ICD-10-CM | POA: Diagnosis not present

## 2022-05-27 DIAGNOSIS — E1122 Type 2 diabetes mellitus with diabetic chronic kidney disease: Secondary | ICD-10-CM | POA: Diagnosis not present

## 2022-05-27 DIAGNOSIS — Z794 Long term (current) use of insulin: Secondary | ICD-10-CM | POA: Diagnosis not present

## 2022-05-27 DIAGNOSIS — Z7984 Long term (current) use of oral hypoglycemic drugs: Secondary | ICD-10-CM | POA: Diagnosis not present

## 2022-05-27 DIAGNOSIS — Z9181 History of falling: Secondary | ICD-10-CM | POA: Diagnosis not present

## 2022-05-27 DIAGNOSIS — E113219 Type 2 diabetes mellitus with mild nonproliferative diabetic retinopathy with macular edema, unspecified eye: Secondary | ICD-10-CM | POA: Diagnosis not present

## 2022-05-27 DIAGNOSIS — Z7982 Long term (current) use of aspirin: Secondary | ICD-10-CM | POA: Diagnosis not present

## 2022-05-27 DIAGNOSIS — I2510 Atherosclerotic heart disease of native coronary artery without angina pectoris: Secondary | ICD-10-CM | POA: Diagnosis not present

## 2022-05-27 DIAGNOSIS — N183 Chronic kidney disease, stage 3 unspecified: Secondary | ICD-10-CM | POA: Diagnosis not present

## 2022-05-27 DIAGNOSIS — I5032 Chronic diastolic (congestive) heart failure: Secondary | ICD-10-CM | POA: Diagnosis not present

## 2022-05-27 DIAGNOSIS — E785 Hyperlipidemia, unspecified: Secondary | ICD-10-CM | POA: Diagnosis not present

## 2022-05-27 DIAGNOSIS — E1142 Type 2 diabetes mellitus with diabetic polyneuropathy: Secondary | ICD-10-CM | POA: Diagnosis not present

## 2022-05-27 DIAGNOSIS — E559 Vitamin D deficiency, unspecified: Secondary | ICD-10-CM | POA: Diagnosis not present

## 2022-05-27 DIAGNOSIS — I110 Hypertensive heart disease with heart failure: Secondary | ICD-10-CM | POA: Diagnosis not present

## 2022-05-27 DIAGNOSIS — G8929 Other chronic pain: Secondary | ICD-10-CM | POA: Diagnosis not present

## 2022-05-27 DIAGNOSIS — Z79891 Long term (current) use of opiate analgesic: Secondary | ICD-10-CM | POA: Diagnosis not present

## 2022-06-01 DIAGNOSIS — E1142 Type 2 diabetes mellitus with diabetic polyneuropathy: Secondary | ICD-10-CM | POA: Diagnosis not present

## 2022-06-01 DIAGNOSIS — E1122 Type 2 diabetes mellitus with diabetic chronic kidney disease: Secondary | ICD-10-CM | POA: Diagnosis not present

## 2022-06-01 DIAGNOSIS — I2510 Atherosclerotic heart disease of native coronary artery without angina pectoris: Secondary | ICD-10-CM | POA: Diagnosis not present

## 2022-06-01 DIAGNOSIS — E559 Vitamin D deficiency, unspecified: Secondary | ICD-10-CM | POA: Diagnosis not present

## 2022-06-01 DIAGNOSIS — Z9181 History of falling: Secondary | ICD-10-CM | POA: Diagnosis not present

## 2022-06-01 DIAGNOSIS — Z79891 Long term (current) use of opiate analgesic: Secondary | ICD-10-CM | POA: Diagnosis not present

## 2022-06-01 DIAGNOSIS — M542 Cervicalgia: Secondary | ICD-10-CM | POA: Diagnosis not present

## 2022-06-01 DIAGNOSIS — N183 Chronic kidney disease, stage 3 unspecified: Secondary | ICD-10-CM | POA: Diagnosis not present

## 2022-06-01 DIAGNOSIS — I5032 Chronic diastolic (congestive) heart failure: Secondary | ICD-10-CM | POA: Diagnosis not present

## 2022-06-01 DIAGNOSIS — E113219 Type 2 diabetes mellitus with mild nonproliferative diabetic retinopathy with macular edema, unspecified eye: Secondary | ICD-10-CM | POA: Diagnosis not present

## 2022-06-01 DIAGNOSIS — E785 Hyperlipidemia, unspecified: Secondary | ICD-10-CM | POA: Diagnosis not present

## 2022-06-01 DIAGNOSIS — Z7984 Long term (current) use of oral hypoglycemic drugs: Secondary | ICD-10-CM | POA: Diagnosis not present

## 2022-06-01 DIAGNOSIS — I110 Hypertensive heart disease with heart failure: Secondary | ICD-10-CM | POA: Diagnosis not present

## 2022-06-01 DIAGNOSIS — M150 Primary generalized (osteo)arthritis: Secondary | ICD-10-CM | POA: Diagnosis not present

## 2022-06-01 DIAGNOSIS — G8929 Other chronic pain: Secondary | ICD-10-CM | POA: Diagnosis not present

## 2022-06-01 DIAGNOSIS — Z7982 Long term (current) use of aspirin: Secondary | ICD-10-CM | POA: Diagnosis not present

## 2022-06-01 DIAGNOSIS — Z794 Long term (current) use of insulin: Secondary | ICD-10-CM | POA: Diagnosis not present

## 2022-06-02 ENCOUNTER — Ambulatory Visit (HOSPITAL_COMMUNITY)
Admission: RE | Admit: 2022-06-02 | Discharge: 2022-06-02 | Disposition: A | Discharge status: Home / Self Care | Payer: Medicare Other | Source: Ambulatory Visit | Attending: Cardiovascular Disease | Admitting: Cardiovascular Disease

## 2022-06-02 DIAGNOSIS — R0989 Other specified symptoms and signs involving the circulatory and respiratory systems: Secondary | ICD-10-CM | POA: Diagnosis not present

## 2022-06-12 DIAGNOSIS — E785 Hyperlipidemia, unspecified: Secondary | ICD-10-CM | POA: Diagnosis not present

## 2022-06-12 DIAGNOSIS — I110 Hypertensive heart disease with heart failure: Secondary | ICD-10-CM | POA: Diagnosis not present

## 2022-06-12 DIAGNOSIS — G8929 Other chronic pain: Secondary | ICD-10-CM | POA: Diagnosis not present

## 2022-06-12 DIAGNOSIS — E1142 Type 2 diabetes mellitus with diabetic polyneuropathy: Secondary | ICD-10-CM | POA: Diagnosis not present

## 2022-06-12 DIAGNOSIS — Z9181 History of falling: Secondary | ICD-10-CM | POA: Diagnosis not present

## 2022-06-12 DIAGNOSIS — Z794 Long term (current) use of insulin: Secondary | ICD-10-CM | POA: Diagnosis not present

## 2022-06-12 DIAGNOSIS — I2510 Atherosclerotic heart disease of native coronary artery without angina pectoris: Secondary | ICD-10-CM | POA: Diagnosis not present

## 2022-06-12 DIAGNOSIS — Z79891 Long term (current) use of opiate analgesic: Secondary | ICD-10-CM | POA: Diagnosis not present

## 2022-06-12 DIAGNOSIS — I5032 Chronic diastolic (congestive) heart failure: Secondary | ICD-10-CM | POA: Diagnosis not present

## 2022-06-12 DIAGNOSIS — Z7982 Long term (current) use of aspirin: Secondary | ICD-10-CM | POA: Diagnosis not present

## 2022-06-12 DIAGNOSIS — E559 Vitamin D deficiency, unspecified: Secondary | ICD-10-CM | POA: Diagnosis not present

## 2022-06-12 DIAGNOSIS — E113219 Type 2 diabetes mellitus with mild nonproliferative diabetic retinopathy with macular edema, unspecified eye: Secondary | ICD-10-CM | POA: Diagnosis not present

## 2022-06-12 DIAGNOSIS — E1122 Type 2 diabetes mellitus with diabetic chronic kidney disease: Secondary | ICD-10-CM | POA: Diagnosis not present

## 2022-06-12 DIAGNOSIS — M150 Primary generalized (osteo)arthritis: Secondary | ICD-10-CM | POA: Diagnosis not present

## 2022-06-12 DIAGNOSIS — M542 Cervicalgia: Secondary | ICD-10-CM | POA: Diagnosis not present

## 2022-06-12 DIAGNOSIS — N183 Chronic kidney disease, stage 3 unspecified: Secondary | ICD-10-CM | POA: Diagnosis not present

## 2022-06-12 DIAGNOSIS — Z7984 Long term (current) use of oral hypoglycemic drugs: Secondary | ICD-10-CM | POA: Diagnosis not present

## 2022-07-01 ENCOUNTER — Telehealth: Payer: Self-pay | Admitting: Dietician

## 2022-07-01 NOTE — Telephone Encounter (Signed)
Has reschedules her eye exam at [facility] office 4-5 times. [name] [name] says she has trouble getting transportation.

## 2022-07-02 ENCOUNTER — Ambulatory Visit (INDEPENDENT_AMBULATORY_CARE_PROVIDER_SITE_OTHER): Payer: Medicare Other | Admitting: Internal Medicine

## 2022-07-02 ENCOUNTER — Encounter: Payer: Self-pay | Admitting: Internal Medicine

## 2022-07-02 VITALS — BP 131/64 | HR 75 | Temp 97.7°F | Ht 63.0 in | Wt 219.3 lb

## 2022-07-02 DIAGNOSIS — E1169 Type 2 diabetes mellitus with other specified complication: Secondary | ICD-10-CM | POA: Diagnosis not present

## 2022-07-02 DIAGNOSIS — Z7984 Long term (current) use of oral hypoglycemic drugs: Secondary | ICD-10-CM

## 2022-07-02 DIAGNOSIS — I10 Essential (primary) hypertension: Secondary | ICD-10-CM | POA: Diagnosis not present

## 2022-07-02 DIAGNOSIS — G8929 Other chronic pain: Secondary | ICD-10-CM

## 2022-07-02 DIAGNOSIS — E1142 Type 2 diabetes mellitus with diabetic polyneuropathy: Secondary | ICD-10-CM

## 2022-07-02 DIAGNOSIS — I130 Hypertensive heart and chronic kidney disease with heart failure and stage 1 through stage 4 chronic kidney disease, or unspecified chronic kidney disease: Secondary | ICD-10-CM | POA: Diagnosis not present

## 2022-07-02 DIAGNOSIS — E1122 Type 2 diabetes mellitus with diabetic chronic kidney disease: Secondary | ICD-10-CM | POA: Diagnosis not present

## 2022-07-02 DIAGNOSIS — Z86010 Personal history of colonic polyps: Secondary | ICD-10-CM

## 2022-07-02 DIAGNOSIS — F3340 Major depressive disorder, recurrent, in remission, unspecified: Secondary | ICD-10-CM

## 2022-07-02 DIAGNOSIS — M549 Dorsalgia, unspecified: Secondary | ICD-10-CM

## 2022-07-02 DIAGNOSIS — E559 Vitamin D deficiency, unspecified: Secondary | ICD-10-CM | POA: Diagnosis not present

## 2022-07-02 DIAGNOSIS — I25118 Atherosclerotic heart disease of native coronary artery with other forms of angina pectoris: Secondary | ICD-10-CM | POA: Diagnosis not present

## 2022-07-02 DIAGNOSIS — I5032 Chronic diastolic (congestive) heart failure: Secondary | ICD-10-CM | POA: Diagnosis not present

## 2022-07-02 DIAGNOSIS — M542 Cervicalgia: Secondary | ICD-10-CM | POA: Diagnosis not present

## 2022-07-02 DIAGNOSIS — E119 Type 2 diabetes mellitus without complications: Secondary | ICD-10-CM

## 2022-07-02 DIAGNOSIS — E113219 Type 2 diabetes mellitus with mild nonproliferative diabetic retinopathy with macular edema, unspecified eye: Secondary | ICD-10-CM | POA: Diagnosis not present

## 2022-07-02 DIAGNOSIS — N183 Chronic kidney disease, stage 3 unspecified: Secondary | ICD-10-CM

## 2022-07-02 DIAGNOSIS — Z23 Encounter for immunization: Secondary | ICD-10-CM | POA: Diagnosis not present

## 2022-07-02 DIAGNOSIS — R2681 Unsteadiness on feet: Secondary | ICD-10-CM

## 2022-07-02 DIAGNOSIS — R1319 Other dysphagia: Secondary | ICD-10-CM

## 2022-07-02 DIAGNOSIS — I110 Hypertensive heart disease with heart failure: Secondary | ICD-10-CM

## 2022-07-02 DIAGNOSIS — Z794 Long term (current) use of insulin: Secondary | ICD-10-CM

## 2022-07-02 DIAGNOSIS — Z8619 Personal history of other infectious and parasitic diseases: Secondary | ICD-10-CM

## 2022-07-02 DIAGNOSIS — E785 Hyperlipidemia, unspecified: Secondary | ICD-10-CM

## 2022-07-02 DIAGNOSIS — Z6838 Body mass index (BMI) 38.0-38.9, adult: Secondary | ICD-10-CM

## 2022-07-02 LAB — POCT GLYCOSYLATED HEMOGLOBIN (HGB A1C): Hemoglobin A1C: 7.5 % — AB (ref 4.0–5.6)

## 2022-07-02 LAB — GLUCOSE, CAPILLARY: Glucose-Capillary: 152 mg/dL — ABNORMAL HIGH (ref 70–99)

## 2022-07-02 MED ORDER — LORATADINE 10 MG PO TABS: 10.0000 mg | ORAL_TABLET | Freq: Every day | ORAL | 3 refills | Status: DC | PRN

## 2022-07-02 MED ORDER — NITROGLYCERIN 0.4 MG SL SUBL: SUBLINGUAL_TABLET | SUBLINGUAL | 5 refills | Status: DC

## 2022-07-02 MED ORDER — LISINOPRIL 20 MG PO TABS: 20.0000 mg | ORAL_TABLET | Freq: Every day | ORAL | 3 refills | Status: DC

## 2022-07-02 NOTE — Assessment & Plan Note (Addendum)
The device she discussed with podiatrist wasn't appropriate for her. They recommended Nervive cream which she could not afford.

## 2022-07-02 NOTE — Assessment & Plan Note (Signed)
Continues on trulicity 1.8 mg and lantus 22 units daily.  A1C today.  Eye exam scheduled 09/2022.

## 2022-07-02 NOTE — Assessment & Plan Note (Signed)
No falls, though she feels unsteady. HHPT completed only 2 visits; she was advised that insurance would not cover additional visits.  Refer to outpatient PT for this and for chronic neck and back pain, which is likely related.  She agrees.

## 2022-07-02 NOTE — Assessment & Plan Note (Signed)
Well controlled on current regimen.  BMP to assess electrolytes and renal fxn today.

## 2022-07-02 NOTE — Assessment & Plan Note (Signed)
Patient advised by GI office that f/u would be scheduled for 2024.  No rectal bleeding.

## 2022-07-02 NOTE — Assessment & Plan Note (Signed)
Compensated.  No LE edema, JVD, or pulmonary rales on exam.  Monitor.

## 2022-07-02 NOTE — Assessment & Plan Note (Signed)
DUe for recheck level today after daily supplement dose increased.

## 2022-07-02 NOTE — Assessment & Plan Note (Signed)
BMI is fluctuating with change in height measurements (she is being recorded as 5'3" rather than 4'11").  Error suspected, we'll check next time.  Continues on trulicity/dulaglutide.

## 2022-07-02 NOTE — Progress Notes (Signed)
Ms. Peine presents for routine quarterly f/u of chronic conditions including DM2, HTN, chronic pain, among other problems.  Since our last visit, she has had no acute problems but has been hampered by neck, back, and bilateral sciatica pain.  To review, her insurance company doesn't cover Tramadol which had worked so well for her.  I had referred her for HHPT; PT only came for 2 visits, "insurance wouldn't cover any more".     Function: Not getting out much, spends her days watching TV. No falls, but sometimes feels concerned about balance and is very cautious; uses multi prong cane, which she's had about 3 years.  ADLs are independent.  Has a shower chair and shower mat. IADLs:  Daughter does shopping and "heavy' cooking, assists with housework.  She manages her personal finances/business and medications independently and is facile with cellphone.  Daughter Karna Dupes is the person most available to help her and whom she would call first in an emergency.  Also available is daughter Truett Mainland.  Physical exam: BP 131/64 (BP Location: Right Arm, Patient Position: Sitting, Cuff Size: Small)   Pulse 75   Temp 97.7 F (36.5 C) (Oral)   Ht _0  (1.6 m)   Wt 219 lb 4.8 oz (99.5 kg)   SpO2 100%   BMI 38.85 kg/m  Alert and conversant, smiles, calm.  Heart sounds nml, lungs clear, neg JVD, no LE edema.  Neck ROM is reduced in all planes due to stiffness and discomfort.  Gait is very slow, stooped.    Assessment and plan:  Hx of esophageal dysphagia requiring dilatation. No dysphagia recently, no GERD lately.  Monitor on PPI.  Diabetic polyneuropathy associated with type 2 diabetes mellitus ([place]) The device she discussed with podiatrist wasn't appropriate for her. They recommended Nervive cream which she could not afford.  HTN (hypertension) Well controlled on current regimen.  BMP to assess electrolytes and renal fxn today.  Hypertensive heart disease with chronic diastolic  congestive heart failure ([place]) Compensated.  No LE edema, JVD, or pulmonary rales on exam.  Monitor.    CKD stage 3 due to type 2 diabetes mellitus (HCC) UAC and eGFR today.  Has been stable.    Diabetic retinopathy [facility]) Ophthalmology appt scheduled for 09/2022.    Hyperlipidemia associated with type 2 diabetes mellitus [facility]) Cardiologist has already ordered upcoming lipid panel.  Will check liver enzymes today to assess tolerance (hx of unexplained asymptomatic acute hepatitis which may have been due to statin; stopped temporarily and restarted).   Depression, major, recurrent, in remission ([place]) (on therapy) Remaining in remission on current therapy.  PHQ 9=0.    Gait instability No falls, though she feels unsteady. HHPT completed only 2 visits; she was advised that insurance would not cover additional visits.  Refer to outpatient PT for this and for chronic neck and back pain, which is likely related.  She agrees.    Personal history of colonic polyps Patient advised by GI office that f/u would be scheduled for 2024.  No rectal bleeding.  Vitamin D deficiency DUe for recheck level today after daily supplement dose increased.    Chronic neck and back pain Insurance will not cover the tramadol.  She is experiencing pain all day, everyday, in neck, back, and down each leg.  I'll look into GoodRx coupon for tramadol (she has taken it with good results in the past).  PT referral for stretching, deep massage, exercise, ROM, TENS, or any other modability which might  be helpful.    Class 3 obesity (HCC) BMI is fluctuating with change in height measurements (she is being recorded as 5'3" rather than 4'11").  Error suspected, we'll check next time.  Continues on trulicity/dulaglutide.   DM type 2, goal HbA1c < 8% (HCC) Continues on trulicity 1.8 mg and lantus 22 units daily.  A1C today.  Eye exam scheduled 09/2022.      Accepts flu shot today.

## 2022-07-02 NOTE — Assessment & Plan Note (Signed)
Ophthalmology appt scheduled for 09/2022.

## 2022-07-02 NOTE — Assessment & Plan Note (Signed)
No dysphagia recently, no GERD lately.  Monitor on PPI.

## 2022-07-02 NOTE — Assessment & Plan Note (Signed)
Insurance will not cover the tramadol.  She is experiencing pain all day, everyday, in neck, back, and down each leg.  I'll look into GoodRx coupon for tramadol (she has taken it with good results in the past).  PT referral for stretching, deep massage, exercise, ROM, TENS, or any other modability which might be helpful.

## 2022-07-02 NOTE — Patient Instructions (Signed)
[name] [name],  I always enjoy catching up with you!  I'm sorry you're experiencing such pain in your neck and your back, and that your home physical therapy was limited.  Let's get you to the outpatient physical therapy center (as long as you can get transportation arrange) for more attention to this problem.  This is particularly important as insurance will not cover your tramadol which had been so effective in the past.  Bloodwork today, I'll call you with results next week.  Take care and see you in 3 months!  Please call if you need anything.  Dr. Jimmye Norman

## 2022-07-02 NOTE — Assessment & Plan Note (Signed)
Remaining in remission on current therapy.  PHQ 9=0.

## 2022-07-02 NOTE — Assessment & Plan Note (Signed)
Cardiologist has already ordered upcoming lipid panel.  Will check liver enzymes today to assess tolerance (hx of unexplained asymptomatic acute hepatitis which may have been due to statin; stopped temporarily and restarted).

## 2022-07-02 NOTE — Assessment & Plan Note (Signed)
UAC and eGFR today.  Has been stable.

## 2022-07-03 LAB — MICROALBUMIN / CREATININE URINE RATIO
Creatinine, Urine: 94.7 mg/dL
Microalb/Creat Ratio: <3 mg/g creat (ref 0–29)
Microalbumin, Urine: <3 ug/mL

## 2022-07-08 ENCOUNTER — Telehealth: Payer: Self-pay | Admitting: *Deleted

## 2022-07-08 ENCOUNTER — Telehealth: Payer: Self-pay

## 2022-07-08 LAB — CMP14 + ANION GAP
ALT: 11 IU/L (ref 0–32)
AST: 15 IU/L (ref 0–40)
Albumin/Globulin Ratio: 1.3 (ref 1.2–2.2)
Albumin: 4.4 g/dL (ref 3.8–4.8)
Alkaline Phosphatase: 100 IU/L (ref 44–121)
Anion Gap: 20 mmol/L — ABNORMAL HIGH (ref 10.0–18.0)
BUN/Creatinine Ratio: 30 — ABNORMAL HIGH (ref 12–28)
BUN: 32 mg/dL — ABNORMAL HIGH (ref 8–27)
Bilirubin Total: 0.3 mg/dL (ref 0.0–1.2)
CO2: 20 mmol/L (ref 20–29)
Calcium: 9.4 mg/dL (ref 8.7–10.3)
Chloride: 103 mmol/L (ref 96–106)
Creatinine, Ser: 1.08 mg/dL — ABNORMAL HIGH (ref 0.57–1.00)
Globulin, Total: 3.3 g/dL (ref 1.5–4.5)
Glucose: 143 mg/dL — ABNORMAL HIGH (ref 70–99)
Potassium: 5.8 mmol/L — ABNORMAL HIGH (ref 3.5–5.2)
Sodium: 143 mmol/L (ref 134–144)
Total Protein: 7.7 g/dL (ref 6.0–8.5)
eGFR: 53 mL/min/{1.73_m2} — ABNORMAL LOW (ref >59)

## 2022-07-08 LAB — VITAMIN D 25 HYDROXY (VIT D DEFICIENCY, FRACTURES): Vit D, 25-Hydroxy: 21.4 ng/mL — ABNORMAL LOW (ref 30.0–100.0)

## 2022-07-08 NOTE — Patient Outreach (Signed)
  Care Coordination   Initial Visit Note   07/08/2022 Name: Onyx Edgley MRN: [id number] DOB: Jan 22, 1946  Anahid Eskelson Lamb is a 76 y.o. year old female who sees Jimmye Norman, Elaina Pattee, MD for primary care. I spoke with  [name] by phone today.  What matters to the patients health and wellness today?  Patient referred by Debera Lat, RD, diabetic educator to assist patient with getting to her eye appointments.  Patient has missed multiple eye appointments.  Patient has [facility] Medicare which includes transportation benefits, which patient was not aware of.  Provided [facility] transportation services phone number [phone]) and let the patient know the number is also on the back of her card.  Patient to call back if she has difficulty scheduling.   Goals Addressed             This Visit's Progress    COMPLETED: Care coordination activities- no follow up needed       Care Coordination Interventions: Provided education to patient re: Her [facility] Medicare plan has transportation services and provided phone number-[phone] to scheduled.  Informed they need 3 day notice for medical appointments. Assessed social determinant of health barriers         SDOH assessments and interventions completed:  Yes  SDOH Interventions Today    Flowsheet Row Most Recent Value  SDOH Interventions   Housing Interventions Intervention Not Indicated  Transportation Interventions Other (Comment)  [Provided patient information on her [facility] plan providing transportation.]        Care Coordination Interventions Activated:  Yes  Care Coordination Interventions:  Yes, provided   Follow up plan: No further intervention required.   Encounter Outcome:  Pt. Visit Completed

## 2022-07-08 NOTE — Telephone Encounter (Signed)
Received faxed results from lab staff regarding elevated potassium at 5.8. There was a delay in receiving results. Lab staff recommends repeating test.

## 2022-07-13 ENCOUNTER — Ambulatory Visit (INDEPENDENT_AMBULATORY_CARE_PROVIDER_SITE_OTHER): Payer: Self-pay | Admitting: Podiatry

## 2022-07-13 DIAGNOSIS — Z91199 Patient's noncompliance with other medical treatment and regimen due to unspecified reason: Secondary | ICD-10-CM

## 2022-07-13 NOTE — Progress Notes (Signed)
1. No-show for appointment     

## 2022-07-17 ENCOUNTER — Other Ambulatory Visit: Payer: Self-pay

## 2022-07-17 DIAGNOSIS — F325 Major depressive disorder, single episode, in full remission: Secondary | ICD-10-CM

## 2022-07-17 MED ORDER — PAROXETINE HCL 40 MG PO TABS: 40.0000 mg | ORAL_TABLET | Freq: Every morning | ORAL | 3 refills | Status: DC

## 2022-07-23 ENCOUNTER — Other Ambulatory Visit: Payer: Self-pay

## 2022-07-23 DIAGNOSIS — K9089 Other intestinal malabsorption: Secondary | ICD-10-CM

## 2022-07-23 MED ORDER — COLESTIPOL HCL 1 G PO TABS: 1.0000 g | ORAL_TABLET | Freq: Two times a day (BID) | ORAL | 3 refills | Status: DC | PRN

## 2022-07-30 ENCOUNTER — Other Ambulatory Visit: Payer: Self-pay

## 2022-07-30 MED ORDER — METFORMIN HCL ER 500 MG PO TB24: 500.0000 mg | ORAL_TABLET | Freq: Two times a day (BID) | ORAL | 3 refills | Status: DC

## 2022-08-06 ENCOUNTER — Other Ambulatory Visit: Payer: Self-pay

## 2022-08-06 DIAGNOSIS — E119 Type 2 diabetes mellitus without complications: Secondary | ICD-10-CM

## 2022-08-07 MED ORDER — ACCU-CHEK GUIDE VI STRP: ORAL_STRIP | 10 refills | Status: DC

## 2022-08-10 ENCOUNTER — Other Ambulatory Visit: Payer: Self-pay | Admitting: *Deleted

## 2022-08-10 DIAGNOSIS — E118 Type 2 diabetes mellitus with unspecified complications: Secondary | ICD-10-CM

## 2022-08-10 NOTE — Telephone Encounter (Signed)
LOV 07/02/22 with PCP.

## 2022-08-11 ENCOUNTER — Other Ambulatory Visit: Payer: Self-pay

## 2022-08-11 DIAGNOSIS — E118 Type 2 diabetes mellitus with unspecified complications: Secondary | ICD-10-CM

## 2022-08-11 MED ORDER — VICTOZA 18 MG/3ML ~~LOC~~ SOPN: 1.8000 mg | PEN_INJECTOR | Freq: Every day | SUBCUTANEOUS | 11 refills | Status: DC

## 2022-08-12 ENCOUNTER — Telehealth: Payer: Self-pay | Admitting: Internal Medicine

## 2022-08-12 ENCOUNTER — Other Ambulatory Visit: Payer: Self-pay | Admitting: Internal Medicine

## 2022-08-12 DIAGNOSIS — E875 Hyperkalemia: Secondary | ICD-10-CM

## 2022-08-12 NOTE — Telephone Encounter (Signed)
Called again to ask that [name] [name] return to recheck K, elevated at last visit. VM full and could not leave mssg.  I'll go ahead and place order and continue to call.

## 2022-08-13 NOTE — Addendum Note (Signed)
Addended by: Truddie Crumble on: 08/13/2022 03:00 PM   Modules accepted: Orders

## 2022-08-20 ENCOUNTER — Other Ambulatory Visit: Payer: Self-pay | Admitting: Internal Medicine

## 2022-08-20 ENCOUNTER — Ambulatory Visit: Payer: Medicare Other | Attending: Internal Medicine

## 2022-08-20 DIAGNOSIS — E875 Hyperkalemia: Secondary | ICD-10-CM

## 2022-08-20 DIAGNOSIS — I2510 Atherosclerotic heart disease of native coronary artery without angina pectoris: Secondary | ICD-10-CM | POA: Diagnosis not present

## 2022-08-20 LAB — LIPID PANEL
Chol/HDL Ratio: 3.4 ratio (ref 0.0–4.4)
Cholesterol, Total: 170 mg/dL (ref 100–199)
HDL: 50 mg/dL (ref >39)
LDL Chol Calc (NIH): 84 mg/dL (ref 0–99)
Triglycerides: 213 mg/dL — ABNORMAL HIGH (ref 0–149)
VLDL Cholesterol Cal: 36 mg/dL (ref 5–40)

## 2022-08-20 LAB — ALT: ALT: 11 IU/L (ref 0–32)

## 2022-08-24 ENCOUNTER — Telehealth: Payer: Self-pay

## 2022-08-24 DIAGNOSIS — E1169 Type 2 diabetes mellitus with other specified complication: Secondary | ICD-10-CM

## 2022-08-24 DIAGNOSIS — I2510 Atherosclerotic heart disease of native coronary artery without angina pectoris: Secondary | ICD-10-CM

## 2022-08-24 DIAGNOSIS — I739 Peripheral vascular disease, unspecified: Secondary | ICD-10-CM

## 2022-08-24 MED ORDER — ROSUVASTATIN CALCIUM 20 MG PO TABS: 20.0000 mg | ORAL_TABLET | Freq: Every day | ORAL | 3 refills | Status: DC

## 2022-08-24 NOTE — Telephone Encounter (Signed)
The patient has been notified of the result and verbalized understanding.  All questions (if any) were answered. Bedelia Person Jolina Symonds, RN 08/24/2022 4:52 PM   Pt will come in for FLP and ALT on 11/24/21. Appointment reminder sent out to pt.

## 2022-08-24 NOTE — Telephone Encounter (Signed)
-----   Message from Werner Lean, MD sent at 08/23/2022  2:25 PM EST ----- TGS elevated LDL decreased but above goal. - increase to rosuvastatin 20 mg and labs in three months

## 2022-08-26 ENCOUNTER — Telehealth: Payer: Self-pay | Admitting: *Deleted

## 2022-08-26 ENCOUNTER — Other Ambulatory Visit (INDEPENDENT_AMBULATORY_CARE_PROVIDER_SITE_OTHER): Payer: Medicare Other

## 2022-08-26 DIAGNOSIS — E875 Hyperkalemia: Secondary | ICD-10-CM

## 2022-08-26 LAB — BASIC METABOLIC PANEL
Anion gap: 10 (ref 5–15)
BUN: 26 mg/dL — ABNORMAL HIGH (ref 8–23)
CO2: 26 mmol/L (ref 22–32)
Calcium: 8.5 mg/dL — ABNORMAL LOW (ref 8.9–10.3)
Chloride: 106 mmol/L (ref 98–111)
Creatinine, Ser: 1.08 mg/dL — ABNORMAL HIGH (ref 0.44–1.00)
GFR, Estimated: 53 mL/min — ABNORMAL LOW (ref >60)
Glucose, Bld: 165 mg/dL — ABNORMAL HIGH (ref 70–99)
Potassium: 4.3 mmol/L (ref 3.5–5.1)
Sodium: 142 mmol/L (ref 135–145)

## 2022-08-26 NOTE — Telephone Encounter (Signed)
Call from pt - stated her doctor had called her.Per previous telephone note, Dr Jimmye Norman had called pt about her lab results. Pt informed "Wanted to inform [name] [name] that her potassium is now perfectly normal and kidney function is stable." Told pt I will let Dr Jimmye Norman know I had talked to her.

## 2022-08-26 NOTE — Progress Notes (Signed)
I appreciate the mssg!

## 2022-08-26 NOTE — Progress Notes (Signed)
Attempted calls on each phone - one mailbox full, the other has not been set up.  Wanted to inform [name] [name] that her potassium is now perfectly normal and kidney function is stable.

## 2022-09-09 DIAGNOSIS — N182 Chronic kidney disease, stage 2 (mild): Secondary | ICD-10-CM | POA: Diagnosis not present

## 2022-09-09 DIAGNOSIS — I5030 Unspecified diastolic (congestive) heart failure: Secondary | ICD-10-CM | POA: Diagnosis not present

## 2022-09-09 DIAGNOSIS — I2510 Atherosclerotic heart disease of native coronary artery without angina pectoris: Secondary | ICD-10-CM | POA: Diagnosis not present

## 2022-09-09 DIAGNOSIS — E1122 Type 2 diabetes mellitus with diabetic chronic kidney disease: Secondary | ICD-10-CM | POA: Diagnosis not present

## 2022-09-09 DIAGNOSIS — E785 Hyperlipidemia, unspecified: Secondary | ICD-10-CM | POA: Diagnosis not present

## 2022-09-09 DIAGNOSIS — N179 Acute kidney failure, unspecified: Secondary | ICD-10-CM | POA: Diagnosis not present

## 2022-09-09 DIAGNOSIS — I129 Hypertensive chronic kidney disease with stage 1 through stage 4 chronic kidney disease, or unspecified chronic kidney disease: Secondary | ICD-10-CM | POA: Diagnosis not present

## 2022-09-24 ENCOUNTER — Ambulatory Visit: Payer: Self-pay | Admitting: Licensed Clinical Social Worker

## 2022-09-24 NOTE — Patient Outreach (Signed)
SW removed self from care team.   Serapio Edelson, BSW, MSW, LCSW-A  Social Worker [facility] Care Management  [phone] 

## 2022-10-13 ENCOUNTER — Other Ambulatory Visit: Payer: Self-pay

## 2022-10-13 DIAGNOSIS — H40053 Ocular hypertension, bilateral: Secondary | ICD-10-CM | POA: Diagnosis not present

## 2022-10-13 DIAGNOSIS — H04123 Dry eye syndrome of bilateral lacrimal glands: Secondary | ICD-10-CM | POA: Diagnosis not present

## 2022-10-13 DIAGNOSIS — H10413 Chronic giant papillary conjunctivitis, bilateral: Secondary | ICD-10-CM | POA: Diagnosis not present

## 2022-10-13 DIAGNOSIS — E113293 Type 2 diabetes mellitus with mild nonproliferative diabetic retinopathy without macular edema, bilateral: Secondary | ICD-10-CM | POA: Diagnosis not present

## 2022-10-13 DIAGNOSIS — Z961 Presence of intraocular lens: Secondary | ICD-10-CM | POA: Diagnosis not present

## 2022-10-13 DIAGNOSIS — H4321 Crystalline deposits in vitreous body, right eye: Secondary | ICD-10-CM | POA: Diagnosis not present

## 2022-10-13 LAB — HM DIABETES EYE EXAM

## 2022-10-13 MED ORDER — PANTOPRAZOLE SODIUM 40 MG PO TBEC: 40.0000 mg | DELAYED_RELEASE_TABLET | Freq: Every day | ORAL | 3 refills | Status: DC

## 2022-10-13 MED ORDER — ATENOLOL 100 MG PO TABS: 100.0000 mg | ORAL_TABLET | Freq: Every day | ORAL | 3 refills | Status: DC

## 2022-10-27 ENCOUNTER — Other Ambulatory Visit: Payer: Self-pay

## 2022-10-27 DIAGNOSIS — E1140 Type 2 diabetes mellitus with diabetic neuropathy, unspecified: Secondary | ICD-10-CM

## 2022-10-27 MED ORDER — PREGABALIN 150 MG PO CAPS: 150.0000 mg | ORAL_CAPSULE | Freq: Every evening | ORAL | 3 refills | Status: DC

## 2022-11-09 ENCOUNTER — Other Ambulatory Visit: Payer: Self-pay

## 2022-11-09 ENCOUNTER — Ambulatory Visit (INDEPENDENT_AMBULATORY_CARE_PROVIDER_SITE_OTHER): Payer: 59 | Admitting: Student

## 2022-11-09 ENCOUNTER — Encounter: Payer: Self-pay | Admitting: Student

## 2022-11-09 VITALS — BP 128/61 | HR 82 | Temp 97.9°F | Resp 28 | Ht 63.0 in | Wt 221.0 lb

## 2022-11-09 DIAGNOSIS — E1169 Type 2 diabetes mellitus with other specified complication: Secondary | ICD-10-CM | POA: Diagnosis not present

## 2022-11-09 DIAGNOSIS — Z794 Long term (current) use of insulin: Secondary | ICD-10-CM | POA: Diagnosis not present

## 2022-11-09 DIAGNOSIS — E1140 Type 2 diabetes mellitus with diabetic neuropathy, unspecified: Secondary | ICD-10-CM

## 2022-11-09 DIAGNOSIS — Z7984 Long term (current) use of oral hypoglycemic drugs: Secondary | ICD-10-CM | POA: Diagnosis not present

## 2022-11-09 DIAGNOSIS — E1122 Type 2 diabetes mellitus with diabetic chronic kidney disease: Secondary | ICD-10-CM

## 2022-11-09 DIAGNOSIS — N183 Chronic kidney disease, stage 3 unspecified: Secondary | ICD-10-CM | POA: Diagnosis not present

## 2022-11-09 DIAGNOSIS — Z604 Social exclusion and rejection: Secondary | ICD-10-CM | POA: Diagnosis not present

## 2022-11-09 DIAGNOSIS — E119 Type 2 diabetes mellitus without complications: Secondary | ICD-10-CM | POA: Diagnosis not present

## 2022-11-09 DIAGNOSIS — E785 Hyperlipidemia, unspecified: Secondary | ICD-10-CM | POA: Diagnosis not present

## 2022-11-09 DIAGNOSIS — I10 Essential (primary) hypertension: Secondary | ICD-10-CM | POA: Diagnosis not present

## 2022-11-09 DIAGNOSIS — I129 Hypertensive chronic kidney disease with stage 1 through stage 4 chronic kidney disease, or unspecified chronic kidney disease: Secondary | ICD-10-CM

## 2022-11-09 DIAGNOSIS — R1319 Other dysphagia: Secondary | ICD-10-CM

## 2022-11-09 LAB — POCT GLYCOSYLATED HEMOGLOBIN (HGB A1C): Hemoglobin A1C: 8.5 % — AB (ref 4.0–5.6)

## 2022-11-09 LAB — GLUCOSE, CAPILLARY: Glucose-Capillary: 134 mg/dL — ABNORMAL HIGH (ref 70–99)

## 2022-11-09 MED ORDER — SEMAGLUTIDE(0.25 OR 0.5MG/DOS) 2 MG/3ML ~~LOC~~ SOPN: PEN_INJECTOR | SUBCUTANEOUS | 5 refills | Status: DC

## 2022-11-09 MED ORDER — PANTOPRAZOLE SODIUM 40 MG PO TBEC: 40.0000 mg | DELAYED_RELEASE_TABLET | Freq: Two times a day (BID) | ORAL | 3 refills | Status: DC

## 2022-11-09 MED ORDER — CAPSAICIN 0.025 % EX LOTN: 1.0000 | TOPICAL_LOTION | Freq: Two times a day (BID) | CUTANEOUS | 0 refills | Status: DC

## 2022-11-09 MED ORDER — ACCU-CHEK FASTCLIX LANCETS MISC: 11 refills | Status: DC

## 2022-11-09 NOTE — Addendum Note (Signed)
Addended by: Althea Grimmer on: 11/09/2022 03:41 PM   Modules accepted: Orders

## 2022-11-09 NOTE — Patient Instructions (Addendum)
For your reflux, we will increase your protonix to '40mg'$  twice a day. Please call the GI doctors to schedule an appointment about food getting stuck in your throat.   For the nerve, please get over the counter capsaicin cream.   For your diabetes, we will transition you to ozempic. We will stop your insulin at night time. Please bring your glucometer at next clinic visit.    For your knots, likely just scar tissue. These will improve with time.

## 2022-11-09 NOTE — Assessment & Plan Note (Signed)
Patient remains obese. Will start ozempic this clinic visit.

## 2022-11-09 NOTE — Assessment & Plan Note (Signed)
  Reflux symptoms returning with heart burn and dysphagia. She feels like food gets stuck but eventually moves through her GI tract. She has no dysphagia to liquids. No N/V. Heart burn. And food gettign stuck. She underwent EGD 05/2018 w/ empiric dilation. She was noted to otherwise have a normal esophagus. She is on Protonix '40mg'$  qd currently.  -Discussed with patient to call [facility] for follow up of this -Increase protonix '40mg'$  BID

## 2022-11-09 NOTE — Assessment & Plan Note (Addendum)
A1c 8.5. Worse from previous check. Her weight is the same.   Patient states she has had some lows but states this was a BG of 73. She is currently on Lantus 22u, metformin '500mg'$  BID, and victoza.   She does have some loose stools with her metformin.   Plan: Hold lantus 22u, Stop victoza as her insurance company is no longer covering this.  Start ozempic Patient's PCP Dr. Jimmye Norman was in clinic and was agreeable to holding patient's insulin. Patient will see her PCP in 1 month to discuss if and when insulin should be resumed. For her neuropathic pain continue lyrica '150mg'$  qhs and add capsaicin cream  to see if this helps with her symptoms.   Patient starting to have subq knots 2/2 victoza and insulin injections. These are starting to become uncomfortable for patient. D/c'd her insulin this clinic visit and transitioned her to ozempic to hopefully help with this.

## 2022-11-09 NOTE — Assessment & Plan Note (Signed)
Patient states that lack of transportation prevents her from being able to interact with people outside of her home.  -Send referral to [facility] for assistance with transportation.

## 2022-11-09 NOTE — Assessment & Plan Note (Signed)
Latest Ref Rng & Units 08/26/2022   11:00 AM 07/02/2022   12:01 PM 04/02/2022   10:40 AM  BMP  Glucose 70 - 99 mg/dL 165  143  107   BUN 8 - 23 mg/dL 26  32  26   Creatinine 0.44 - 1.00 mg/dL 1.08  1.08  0.94   BUN/Creat Ratio 12 - '28  30  28   '$ Sodium 135 - 145 mmol/L 142  143  147   Potassium 3.5 - 5.1 mmol/L 4.3  5.8  5.3   Chloride 98 - 111 mmol/L 106  103  109   CO2 22 - 32 mmol/L '26  20  18   '$ Calcium 8.9 - 10.3 mg/dL 8.5  9.4  9.6    Stable on last check 08/26/2022. Will check in 02/2022.

## 2022-11-09 NOTE — Progress Notes (Addendum)
Subjective:  CC: F/u of her chronic medical problems   HPI:  [name] is a 77 y.o. female with a past medical history stated below who presents today for follow up of her chronic medical problems. Please see problem based assessment and plan for additional details.  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 Katy Fitch annual f/u, most recent 09/2019   CAD, non-obstructive 08/26/2006   Cath 07/05:multiple areas of nonobstructive disease = medical & RF mgmt, well preserved global systolic function. Dr. Lia Foyer. Nuclear med stress test 01/2014 : Normal stress nuclear study. LV Ejection Fraction: 76%. LV Wall Motion: NL LV Function; NL Wall Motion.       Chronic neck and back pain 01/03/2013   Multifactorial. Non opioid requiring.   L2-5 fusion, with hardware at L2-3, stable per MRI 2010. Followed by neurosurgery (Dr. 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     CKD stage 2 due to type 2 diabetes mellitus ([place]) 12/11/2016   DEPRESSION 08/26/2006   On paxil     Diabetic peripheral neuropathy associated with type 2 diabetes mellitus ([place])    Diabetic retinopathy associated with controlled type 2 diabetes mellitus ([place]) 09/2019   with  macular edema OS - Dr. Katy Fitch   DYSLIPIDEMIA 11/01/2006   Gallstones    GERD 08/26/2006   Heartburn QHS.      Hx of blood transfusion without reported diagnosis    Hx of esophageal dysphagia requiring dilatation. 03/03/2018   hx of esophageal stenosis s/p dilatation    Hx of small intestinal bacterial overgrowth 12/24/2015   Presumed / clinical dx 2016. Complete resolution with metronidazole (insuance would not cover rifaximin) Flare 2017. Did not respond to ABX. Referral to  GI B12 and ferritin low supporting malabsorption.    HYPERTENSION 08/26/2006   Hypertensive heart disease with chronic diastolic congestive heart failure ([place]) 05/01/2020   Morbid obesity with BMI of 50.0-59.9, adult ([place])    Personal history of colonic polyps 2020   Adenomatous; f/u 2023 [facility]   PVD (peripheral vascular disease) ([place]), resolved per f/u ABIs 2016 03/27/2014   2015 LE arterial Duplex - Possible mild inflow disease on the left. 0-49% bilateral SFA disease, without focal stenosis. Three vesel run-off, bilaterally. Medical tx.    S/P bilateral cataract extraction    Type 2 diabetes mellitus with complications ([place]) 81/85/6314    Current Outpatient Medications on File Prior to Visit  Medication Sig Dispense Refill   amLODipine (NORVASC) 5 MG tablet Take 1 tablet (5 mg total) by mouth daily. 90 tablet 3   aspirin 81 MG tablet Take 1 tablet (81 mg total) by mouth daily. 90 tablet 3   atenolol (TENORMIN) 100 MG tablet Take 1 tablet (100 mg total) by mouth daily. 90 tablet 3   Blood Glucose Monitoring Suppl (ACCU-CHEK GUIDE) w/Device KIT 1 each by Does not apply route 3 (three) times daily. 1 kit 0   Cholecalciferol (VITAMIN D3 GUMMIES) 25 MCG (1000 UT) CHEW Chew 2 tablets (2,000 Units total) by mouth daily. 180 tablet 3   colestipol (COLESTID) 1 g tablet Take 1-2 tablets (1-2 g total) by mouth 2 (two) times daily as needed. 360 tablet 3  diclofenac Sodium (VOLTAREN) 1 % GEL Apply 2 g topically 4 (four) times daily. To painful areas of hands. 60 g 1   glucose blood (ACCU-CHEK GUIDE) test strip Use to test blood glucose 3 times daily. Dx Y77.41OIN to test blood glucose 3 times daily. Dx E11.65 100 strip 10   Insulin Pen Needle (BD PEN NEEDLE NANO U/F) 32G X 4 MM MISC USE TO INJECT VICTOZA ONCE A DAY AND LANTUS ONCE A DAY AS DIRECTED 100 each 5   lisinopril (ZESTRIL) 20 MG tablet Take 1 tablet (20 mg total) by mouth daily. 90 tablet 3   loratadine (CLARITIN) 10 MG tablet Take 1  tablet (10 mg total) by mouth daily as needed for allergies. TAKE 1 TABLET(10 MG) BY MOUTH DAILY 90 tablet 3   metFORMIN (GLUCOPHAGE-XR) 500 MG 24 hr tablet Take 1 tablet (500 mg total) by mouth in the morning and at bedtime. 180 tablet 3   nitroGLYCERIN (NITROSTAT) 0.4 MG SL tablet PLACE 1 TABLET UNDER THE TONGUE EVERY 5 MINUTES AS NEEDED FOR CHEST PAIN 25 tablet 5   PARoxetine (PAXIL) 40 MG tablet Take 1 tablet (40 mg total) by mouth every morning. 90 tablet 3   pregabalin (LYRICA) 150 MG capsule Take 1 capsule (150 mg total) by mouth every evening. TAKE 1 CAPSULE(150 MG) BY MOUTH DAILY 90 capsule 3   rosuvastatin (CRESTOR) 20 MG tablet Take 1 tablet (20 mg total) by mouth daily. 90 tablet 3   spironolactone (ALDACTONE) 25 MG tablet Take 1 tablet (25 mg total) by mouth daily. 90 tablet 3   ULTRAM 50 MG tablet Take 1 tablet (50 mg total) by mouth 2 (two) times daily as needed for moderate pain or severe pain. 60 tablet 5   No current facility-administered medications on file prior to visit.    Family History  Problem Relation Age of Onset   Cirrhosis Mother    Diabetes Father    Hypertension Father    Hypertension Sister    Diabetes Sister    Hyperlipidemia Sister    Colon cancer Neg Hx    Throat cancer Neg Hx    Esophageal cancer Neg Hx    Rectal cancer Neg Hx    Stomach cancer Neg Hx    Colon polyps Neg Hx     Social History   Socioeconomic History   Marital status: Single    Spouse name: Not on file   Number of children: 2   Years of education: Not on file   Highest education level: Not on file  Occupational History   Not on file  Tobacco Use   Smoking status: Never    Passive exposure: Yes   Smokeless tobacco: Never  Vaping Use   Vaping Use: Never used  Substance and Sexual Activity   Alcohol use: No    Alcohol/week: 0.0 standard drinks of alcohol   Drug use: No   Sexual activity: Not Currently  Other Topics Concern   Not on file  Social History Narrative    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 Has two daughters and several grand kids. Severe limitation in ambulation. Occ goes out to church but usually daughter gets groceries.    She is one of 7 kids and the oldest so helped raise the other 6.   Social Determinants of Health   Financial Resource Strain: Low Risk  (06/08/2019)   Overall Financial Resource Strain (CARDIA)    Difficulty of Paying Living Expenses: Not hard at all  Food Insecurity: No Food Insecurity (11/18/2021)   Hunger Vital Sign    Worried About Running Out of Food in the Last Year: Never true    Ran Out of Food in the Last Year: Never true  Transportation Needs: Unmet Transportation Needs (07/08/2022)   PRAPARE - Transportation    Lack of Transportation (Medical): Yes    Lack of Transportation (Non-Medical): Yes  Physical Activity: Insufficiently Active (06/08/2019)   Exercise Vital Sign    Days of Exercise per Week: 3 days    Minutes of Exercise per Session: 20 min  Stress: Stress Concern Present (06/08/2019)   [place]    Feeling of Stress : To some extent  Social Connections: Moderately Isolated (11/09/2022)   Social Connection and Isolation Panel [NHANES]    Frequency of Communication with Friends and Family: More than three times a week    Frequency of Social Gatherings with Friends and Family: More than three times a week    Attends Religious Services: More than 4 times per year    Active Member of [employer] or Organizations: No    Attends Archivist Meetings: Never    Marital Status: Never married  Intimate Partner Violence: Not At Risk (06/08/2019)   Humiliation, Afraid, Rape, and Kick questionnaire    Fear of Current or Ex-Partner: No    Emotionally Abused: No    Physically Abused: No    Sexually Abused: No    Review of Systems: ROS negative except for what is noted on the assessment and plan.  Objective:   Vitals:   11/09/22  0847 11/09/22 0855  BP: (!) 146/69 128/61  Pulse: 90 82  Resp: (!) 28   Temp: 97.9 F (36.6 C)   TempSrc: Oral   SpO2: 100%   Weight: 221 lb (100.2 kg)   Height: '5\' 3"'$  (1.6 m)     Physical Exam: Constitutional: well-appearing sitting in chair, in no acute distress HENT: normocephalic atraumatic, mucous membranes moist Eyes: conjunctiva non-erythematous Neck: supple Cardiovascular: regular rate and rhythm, no m/r/g Pulmonary/Chest: normal work of breathing on room air, lungs clear to auscultation bilaterally, no rales or wheezing  Abdominal: soft, non-tender, non-distended MSK: normal bulk and tone, 1+ pitting edema of BLE Neurological: alert & oriented x 3 Skin: warm and dry Psych: Affect broad   Assessment & Plan:   HTN (hypertension) Well controlled on lisinopril '20mg'$  qd, Atenolol '100mg'$  qd Spironolactone '25mg'$  qd Amlodipine '5mg'$    Today's Vitals   11/09/22 0847 11/09/22 0850 11/09/22 0855  BP: (!) 146/69  128/61  Pulse: 90  82  Resp: (!) 28    Temp: 97.9 F (36.6 C)    TempSrc: Oral    SpO2: 100%    Weight: 221 lb (100.2 kg)    Height: '5\' 3"'$  (1.6 m)    PainSc:  9     Body mass index is 39.15 kg/m.   DM type 2, goal HbA1c < 8% (HCC) A1c 8.5. Worse from previous check. Her weight is the same.   Patient states she has had some lows but states this was a BG of 73. She is currently on Lantus 22u, metformin '500mg'$  BID, and victoza.   She does have some loose stools with her metformin.   Plan: Hold lantus 22u, Stop victoza as her insurance company is no longer covering this.  Start ozempic Patient's PCP Dr. Jimmye Norman was in clinic and was agreeable to holding patient's insulin. Patient will see  her PCP in 1 month to discuss if and when insulin should be resumed. For her neuropathic pain continue lyrica '150mg'$  qhs and add capsaicin cream  to see if this helps with her symptoms.   Patient starting to have subq knots 2/2 victoza and insulin injections. These are  starting to become uncomfortable for patient. D/c'd her insulin this clinic visit and transitioned her to ozempic to hopefully help with this.   Hyperlipidemia associated with type 2 diabetes mellitus (HCC) Continue rosuvastatin '20mg'$  qd.   CKD stage 3 due to type 2 diabetes mellitus ([place])    Latest Ref Rng & Units 08/26/2022   11:00 AM 07/02/2022   12:01 PM 04/02/2022   10:40 AM  BMP  Glucose 70 - 99 mg/dL 165  143  107   BUN 8 - 23 mg/dL 26  32  26   Creatinine 0.44 - 1.00 mg/dL 1.08  1.08  0.94   BUN/Creat Ratio 12 - '28  30  28   '$ Sodium 135 - 145 mmol/L 142  143  147   Potassium 3.5 - 5.1 mmol/L 4.3  5.8  5.3   Chloride 98 - 111 mmol/L 106  103  109   CO2 22 - 32 mmol/L '26  20  18   '$ Calcium 8.9 - 10.3 mg/dL 8.5  9.4  9.6    Stable on last check 08/26/2022. Will check in 02/2022.   Class 3 obesity ([place]) Patient remains obese. Will start ozempic this clinic visit.   Hx of esophageal dysphagia requiring dilatation.  Reflux symptoms returning with heart burn and dysphagia. She feels like food gets stuck but eventually moves through her GI tract. She has no dysphagia to liquids. No N/V. Heart burn. And food gettign stuck. She underwent EGD 05/2018 w/ empiric dilation. She was noted to otherwise have a normal esophagus. She is on Protonix '40mg'$  qd currently.  -Discussed with patient to call [facility] for follow up of this -Increase protonix '40mg'$  BID     Patient seen with Dr. Jimmye Norman

## 2022-11-09 NOTE — Assessment & Plan Note (Signed)
Well controlled on lisinopril '20mg'$  qd, Atenolol '100mg'$  qd Spironolactone '25mg'$  qd Amlodipine '5mg'$    Today's Vitals   11/09/22 0847 11/09/22 0850 11/09/22 0855  BP: (!) 146/69  128/61  Pulse: 90  82  Resp: (!) 28    Temp: 97.9 F (36.6 C)    TempSrc: Oral    SpO2: 100%    Weight: 221 lb (100.2 kg)    Height: '5\' 3"'$  (1.6 m)    PainSc:  9     Body mass index is 39.15 kg/m.

## 2022-11-09 NOTE — Assessment & Plan Note (Signed)
Continue rosuvastatin '20mg'$  qd.

## 2022-11-13 NOTE — Progress Notes (Signed)
Internal Medicine Clinic Attending   I saw and evaluated the patient.  I personally confirmed the key portions of the history and exam documented by Dr. Carter and I reviewed pertinent patient test results.  The assessment, diagnosis, and plan were formulated together and I agree with the documentation in the resident's note.  

## 2022-11-16 ENCOUNTER — Ambulatory Visit (INDEPENDENT_AMBULATORY_CARE_PROVIDER_SITE_OTHER): Payer: 59 | Admitting: Podiatry

## 2022-11-16 VITALS — BP 151/71

## 2022-11-16 DIAGNOSIS — L84 Corns and callosities: Secondary | ICD-10-CM | POA: Diagnosis not present

## 2022-11-16 DIAGNOSIS — E1142 Type 2 diabetes mellitus with diabetic polyneuropathy: Secondary | ICD-10-CM | POA: Diagnosis not present

## 2022-11-16 DIAGNOSIS — M79675 Pain in left toe(s): Secondary | ICD-10-CM | POA: Diagnosis not present

## 2022-11-16 DIAGNOSIS — M79674 Pain in right toe(s): Secondary | ICD-10-CM

## 2022-11-16 DIAGNOSIS — B351 Tinea unguium: Secondary | ICD-10-CM | POA: Insufficient documentation

## 2022-11-16 NOTE — Progress Notes (Signed)
  Subjective:  Patient ID: [name] [name], female    DOB: Sep 12, 1946,  MRN: [id number]  [name] [name] presents to clinic today for callus(es) b/l lower extremities and painful thick toenails that are difficult to trim. Painful toenails interfere with ambulation. Aggravating factors include wearing enclosed shoe gear. Pain is relieved with periodic professional debridement. Painful calluses are aggravated when weightbearing with and without shoegear. Pain is relieved with periodic professional debridement.  Chief Complaint  Patient presents with   Nail Problem    DFC BS-150 A1C-do not know PCP-Williams, Nancie Neas PCP VST-11/09/2022   New problem(s): None.   PCP is Angelica Pou, MD.  No Known Allergies  Review of Systems: Negative except as noted in the HPI.  Objective:  Vitals:   11/16/22 1101  BP: (!) 151/71   [name] [name] is a pleasant 77 y.o. female obese in NAD. AAO x 3.  Vascular Capillary fill time to digits immediate b/l.Palpable DP pulse(s) b/l LE. Nonpalpable PT pulse(s) b/l LE. Pedal hair sparse. No pain with calf compression b/l. No edema noted b/l LE. No ischemia or gangrene noted b/l LE. No cyanosis or clubbing noted b/l LE.  Neurologic Pt has subjective symptoms of neuropathy. Protective sensation diminished with 10g monofilament b/l. Vibratory sensation intact b/l. Proprioception intact bilaterally.  Dermatologic Pedal integument with normal turgor, texture and tone b/l LE. No open wounds b/l. No interdigital macerations b/l.   Toenails 1-5 b/l elongated, thickened, discolored with subungual debris. +Tenderness with dorsal palpation of nailplates.   Hyperkeratotic lesion(s) noted submet head 1 left foot.  No erythema, no edema, no drainage, no fluctuance.  Orthopedic: Muscle strength 5/5 to all lower extremity muscle groups bilaterally. 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 No pain, crepitus or joint limitation noted with ROM bilateral LE. Pes planus  deformity noted bilateral LE. Patient utilizes cane for gait assistance.   Assessment/Plan: 1. Pain due to onychomycosis of toenails of both feet   2. Callus   3. Diabetic peripheral neuropathy associated with type 2 diabetes mellitus ([place])     -Patient was evaluated and treated. All patient's and/or POA's questions/concerns answered on today's visit. -Medicaid ABN on file for paring of corn(s)/callus(es)/porokeratos(es). Copy in patient chart. -Patient to continue soft, supportive shoe gear daily. -Mycotic toenails 1-5 bilaterally were debrided in length and girth with sterile nail nippers and dremel without incident. -Callus(es) submet head 1 left foot pared utilizing sterile scalpel blade without complication or incident. Total number debrided =1. -Patient/POA to call should there be question/concern in the interim.   Return in about 3 months (around 02/14/2023).  Marzetta Board, DPM

## 2022-11-17 ENCOUNTER — Telehealth: Payer: Self-pay | Admitting: *Deleted

## 2022-11-17 NOTE — Progress Notes (Signed)
  Care Coordination  Outreach Note  11/17/2022 Name: [name] [name] MRN: [id number] DOB: 16-Apr-1946   Care Coordination Outreach Attempts: An unsuccessful telephone outreach was attempted today to offer the patient information about available care coordination services as a benefit of their health plan.   Follow Up Plan:  Additional outreach attempts will be made to offer the patient care coordination information and services.   Encounter Outcome:  No Answer  [place]  Direct Dial: [phone]

## 2022-11-20 NOTE — Progress Notes (Unsigned)
  Care Coordination  Outreach Note  11/20/2022 Name: [name] [name] MRN: [id number] DOB: 11-Jan-1946   Care Coordination Outreach Attempts: A second unsuccessful outreach was attempted today to offer the patient with information about available care coordination services as a benefit of their health plan.     Follow Up Plan:  Additional outreach attempts will be made to offer the patient care coordination information and services.   Encounter Outcome:  No Answer  [place]  Direct Dial: [phone]

## 2022-11-23 ENCOUNTER — Other Ambulatory Visit: Payer: Self-pay

## 2022-11-23 MED ORDER — ATENOLOL 100 MG PO TABS: 100.0000 mg | ORAL_TABLET | Freq: Every day | ORAL | 3 refills | Status: DC

## 2022-11-23 NOTE — Progress Notes (Signed)
  Care Coordination  Outreach Note  11/23/2022 Name: [name] [name] MRN: [id number] DOB: 02-24-1946   Care Coordination Outreach Attempts: A third unsuccessful outreach was attempted today to offer the patient with information about available care coordination services as a benefit of their health plan.   Follow Up Plan:  No further outreach attempts will be made at this time. We have been unable to contact the patient to offer or enroll patient in care coordination services  Encounter Outcome:  No Answer  [place]: [phone]

## 2022-11-24 ENCOUNTER — Ambulatory Visit
Admission: RE | Admit: 2022-11-24 | Discharge: 2022-11-24 | Disposition: A | Discharge status: Home / Self Care | Payer: 59 | Source: Ambulatory Visit | Attending: Sports Medicine | Admitting: Sports Medicine

## 2022-11-24 ENCOUNTER — Ambulatory Visit: Payer: 59 | Attending: Internal Medicine

## 2022-11-24 ENCOUNTER — Ambulatory Visit (INDEPENDENT_AMBULATORY_CARE_PROVIDER_SITE_OTHER): Payer: 59 | Admitting: Sports Medicine

## 2022-11-24 ENCOUNTER — Other Ambulatory Visit: Payer: Self-pay | Admitting: Sports Medicine

## 2022-11-24 VITALS — BP 138/65 | Ht 63.0 in | Wt 221.0 lb

## 2022-11-24 DIAGNOSIS — G8929 Other chronic pain: Secondary | ICD-10-CM

## 2022-11-24 DIAGNOSIS — M549 Dorsalgia, unspecified: Secondary | ICD-10-CM

## 2022-11-24 DIAGNOSIS — I739 Peripheral vascular disease, unspecified: Secondary | ICD-10-CM

## 2022-11-24 DIAGNOSIS — M25551 Pain in right hip: Secondary | ICD-10-CM

## 2022-11-24 DIAGNOSIS — E1169 Type 2 diabetes mellitus with other specified complication: Secondary | ICD-10-CM | POA: Diagnosis not present

## 2022-11-24 DIAGNOSIS — M545 Low back pain, unspecified: Secondary | ICD-10-CM | POA: Diagnosis not present

## 2022-11-24 DIAGNOSIS — E785 Hyperlipidemia, unspecified: Secondary | ICD-10-CM | POA: Diagnosis not present

## 2022-11-24 DIAGNOSIS — I2510 Atherosclerotic heart disease of native coronary artery without angina pectoris: Secondary | ICD-10-CM

## 2022-11-24 LAB — LIPID PANEL
Chol/HDL Ratio: 3 ratio (ref 0.0–4.4)
Cholesterol, Total: 175 mg/dL (ref 100–199)
HDL: 59 mg/dL (ref >39)
LDL Chol Calc (NIH): 94 mg/dL (ref 0–99)
Triglycerides: 124 mg/dL (ref 0–149)
VLDL Cholesterol Cal: 22 mg/dL (ref 5–40)

## 2022-11-24 LAB — ALT: ALT: 14 IU/L (ref 0–32)

## 2022-11-24 MED ORDER — METHYLPREDNISOLONE ACETATE 80 MG/ML IJ SUSP
80.0000 mg | Freq: Once | INTRAMUSCULAR | Status: AC
  Administered 2022-11-24: 80 mg via INTRAMUSCULAR

## 2022-11-24 NOTE — Progress Notes (Unsigned)
   Subjective:    Patient ID: [name] [name], female    DOB: Nov 27, 1945, 77 y.o.   MRN: [id number]  HPI chief complaint: Bilateral hip pain  Patient presents today complaining of bilateral hip pain that she localizes to the posterior hip.  Is been present now for about a year but has worsened recently.  Pain is worse with activity and improves at rest.  Pain does radiate down into her legs.  She is status post lumbar fusion many years ago.  No recent trauma.  No groin pain.  Tramadol has been effective in the past but she tells me that her insurance will no longer cover this for her.  Past medical history reviewed Medications reviewed Allergies reviewed  Review of Systems As above    Objective:   Physical Exam  Well-developed, well-nourished.  No acute distress  Lumbar spine: Limited range of motion but no tenderness to palpation along the lumbar midline.  No focal neurological deficit of either lower extremity.  Examination of both hips show smooth painless hip range of motion.      Assessment & Plan:   Low back pain status post remote lumbar spine fusion  Recommended that we start with a simple IM Depo-Medrol injection today.  I would also like to get some updated x-rays of her lumbar spine including AP, lateral, flexion, and extension views.  Phone follow-up with those x-ray results when available.  If symptoms persist we may need to consider merits of referral to either orthopedic spine surgery or neurosurgery.  This note was dictated using Dragon naturally speaking software and may contain errors in syntax, spelling, or content which have not been identified prior to signing this note.   Addendum: X-rays of the lumbar spine are reviewed.  No acute findings are seen.  Stable postsurgical changes at L2-L3 with stable degenerative spondylosis at L5-S1 at least moderate in degree.  If symptoms persist despite today's IM Depo-Medrol injection and consider referral to Dr. Laurance Flatten at  [facility] to discuss other treatment options.

## 2022-11-25 ENCOUNTER — Encounter: Payer: Self-pay | Admitting: Dietician

## 2022-12-03 ENCOUNTER — Telehealth: Payer: Self-pay

## 2022-12-03 DIAGNOSIS — G8929 Other chronic pain: Secondary | ICD-10-CM

## 2022-12-03 NOTE — Telephone Encounter (Signed)
Referral placed to Dr. Laurance Flatten at [facility] per discussion at last office visit.

## 2022-12-07 NOTE — Progress Notes (Unsigned)
Hello Dr. Jimmye Norman, [name] has tried to contact this patient but has been unable to get in touch with her. She was wondering if during your clinic visit you could have the patient give her a phone call at [place]: [phone].   [name] [name] is here for routine f/u but also reporting problems with bilateral hand pain, stabbing and pricking neck and arm pain, high blood sugar, and chronic rash in groin.  In the interim she has seen the folks at [facility] for bilateral hip pain with radiation down each leg which was treated with IM steroid and LS films were obtained which showed no acute or unstable findings (postsurgical changes were visible).  She has been referred to ortho for opinion on back, though she is more concerned about her neck. She had a lipid panel completed at the cardiologist's office.  See problem based documentation for details of the above.   BP (!) 136/51 (BP Location: Left Arm, Patient Position: Sitting, Cuff Size: Small)   Pulse 78   Temp 97.6 F (36.4 C) (Oral)   Ht '5\' 3"'$  (1.6 m)   Wt 215 lb 8 oz (97.8 kg)   SpO2 98%   BMI 38.17 kg/m   Assessment and Plan (not in order of priority)  Arthritis of both hands Pt reports pain in both hands, making gripping difficult.  Tenderness in L wrist which is also swollen compared to R, with perhaps slightly increased warmth.  Finger joint tenderness primarily in PIPs and MCP, fingers 1-4 primarily affected.  PIPs and 3rd finger MCPs are thickened.  Small Heberden's nodes of DIPs present.  No rheumatoid nodules along arm tendons. She has features of both OA and RA - will obtain xrays and rheum panel. Topical diclofenac for discomfort.   DM type 2, goal HbA1c < 8% (HCC) Hyperglycemia since regimen changed at last visit.  Trulicity (not covered) changed to ozempic, lantus discontinued (we thought that subdermal nodules in skin of abd and thighs were due to injections, though Ms. Brecker does not feel that this was the  case).  Increase Ozempic to 0.5 mg weekly and restart insulin glargine at 10 units to begin, titrate as needed.    Hyperlipidemia associated with type 2 diabetes mellitus (HCC) TC 175, LDL 94 lab only visit cardiology.  On Crestor 20 mg daily.  Tolerating (no increase liver enzymes or muscle pain).  Will plan to discuss increasing at next close visit.  Today was dedicated to other problems.  Chronic neck and back pain Pain continues.  Rec'd IM steroid at [facility] recently, and she has been referred to ortho.  Today she c/o neck and shoulder pain with shooting "pin pokes" down her arms which makes her jump.  Sometimes hands feel numb, though she has difficulty specifying.  Today AROM of neck in all planes does not trigger these symptoms but she feels tightness in the neck muscles.  Negative Spurlings and modified Spurlings (no increase in pain), but when head is lifted in axial traction she feels complete relief of pain.  Trapezia are in spasm and are tender.  She feels a similar pricking stinging sensation with pressure over muscle knots. Suspect nerve impingement but she may also have muscle trigger points.  We'll see what ortho's opinion is.  She may need MRI C spine.   Intertrigo of genitocrural region due to Candida species Extensive contiguous darkening and scaling of skin of bilateral inguinal skin folds where abdominal pannus overlies.  Margins of  rash at upper thighs with few scattered satellite appearing lesions.  Very itchy; there is lichenified skin change where she has scratched.  Will treated as candida with Nystatin cream for now, but she may also have a tinea cruris (less common).

## 2022-12-08 ENCOUNTER — Telehealth: Payer: Self-pay

## 2022-12-08 ENCOUNTER — Encounter: Payer: Self-pay | Admitting: Internal Medicine

## 2022-12-08 ENCOUNTER — Ambulatory Visit (INDEPENDENT_AMBULATORY_CARE_PROVIDER_SITE_OTHER): Payer: 59

## 2022-12-08 ENCOUNTER — Ambulatory Visit (INDEPENDENT_AMBULATORY_CARE_PROVIDER_SITE_OTHER): Payer: 59 | Admitting: Internal Medicine

## 2022-12-08 VITALS — BP 136/51 | HR 78 | Temp 97.6°F | Ht 63.0 in | Wt 215.5 lb

## 2022-12-08 DIAGNOSIS — M542 Cervicalgia: Secondary | ICD-10-CM | POA: Diagnosis not present

## 2022-12-08 DIAGNOSIS — E1169 Type 2 diabetes mellitus with other specified complication: Secondary | ICD-10-CM | POA: Diagnosis not present

## 2022-12-08 DIAGNOSIS — M549 Dorsalgia, unspecified: Secondary | ICD-10-CM | POA: Diagnosis not present

## 2022-12-08 DIAGNOSIS — Z0000 Encounter for general adult medical examination without abnormal findings: Secondary | ICD-10-CM | POA: Diagnosis not present

## 2022-12-08 DIAGNOSIS — M1990 Unspecified osteoarthritis, unspecified site: Secondary | ICD-10-CM | POA: Diagnosis not present

## 2022-12-08 DIAGNOSIS — G8929 Other chronic pain: Secondary | ICD-10-CM

## 2022-12-08 DIAGNOSIS — M19042 Primary osteoarthritis, left hand: Secondary | ICD-10-CM | POA: Diagnosis not present

## 2022-12-08 DIAGNOSIS — B372 Candidiasis of skin and nail: Secondary | ICD-10-CM | POA: Diagnosis not present

## 2022-12-08 DIAGNOSIS — M19041 Primary osteoarthritis, right hand: Secondary | ICD-10-CM | POA: Diagnosis not present

## 2022-12-08 DIAGNOSIS — E1140 Type 2 diabetes mellitus with diabetic neuropathy, unspecified: Secondary | ICD-10-CM

## 2022-12-08 DIAGNOSIS — E119 Type 2 diabetes mellitus without complications: Secondary | ICD-10-CM

## 2022-12-08 DIAGNOSIS — E785 Hyperlipidemia, unspecified: Secondary | ICD-10-CM

## 2022-12-08 MED ORDER — INSULIN GLARGINE-YFGN 100 UNIT/ML ~~LOC~~ SOPN: 10.0000 [IU] | PEN_INJECTOR | Freq: Every day | SUBCUTANEOUS | 2 refills | Status: DC

## 2022-12-08 MED ORDER — NYSTATIN 100000 UNIT/GM EX CREA: 1.0000 | TOPICAL_CREAM | Freq: Two times a day (BID) | CUTANEOUS | 3 refills | Status: DC

## 2022-12-08 MED ORDER — SEMAGLUTIDE(0.25 OR 0.5MG/DOS) 2 MG/3ML ~~LOC~~ SOPN: 0.5000 mg | PEN_INJECTOR | SUBCUTANEOUS | 5 refills | Status: DC

## 2022-12-08 NOTE — Progress Notes (Signed)
Subjective:   [name] [name] is a 77 y.o. female who presents for an Initial Medicare Annual Wellness Visit.  Review of Systems    Defer to PCP.        Objective:    Today's Vitals   12/08/22 1152 12/08/22 1153  BP: (!) 136/51   Pulse: 78   Temp: 97.6 F (36.4 C)   TempSrc: Oral   SpO2: 98%   Weight: 215 lb 8 oz (97.8 kg)   Height: '5\' 3"'$  (1.6 m)   PainSc:  9    Body mass index is 38.17 kg/m.     12/08/2022   11:55 AM 12/08/2022   10:58 AM 11/09/2022    8:46 AM 04/02/2022    9:28 AM 12/25/2021    9:55 AM 11/06/2021   12:06 PM 06/19/2021   11:37 AM  Advanced Directives  Does Patient Have a Medical Advance Directive? No No No No No No No  Would patient like information on creating a medical advance directive? No - Patient declined No - Patient declined No - Patient declined No - Patient declined No - Patient declined No - Patient declined No - Patient declined    Current Medications (verified) Outpatient Encounter Medications as of 12/08/2022  Medication Sig   Accu-Chek FastClix Lancets MISC USE THREE TIMES DAILY. 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   aspirin 81 MG tablet Take 1 tablet (81 mg total) by mouth daily.   atenolol (TENORMIN) 100 MG tablet Take 1 tablet (100 mg total) by mouth daily.   Blood Glucose Monitoring Suppl (ACCU-CHEK GUIDE) w/Device KIT 1 each by Does not apply route 3 (three) times daily.   Capsaicin 0.025 % LOTN Apply 1 strip topically 2 (two) times daily. Try small area first   Cholecalciferol (VITAMIN D3 GUMMIES) 25 MCG (1000 UT) CHEW Chew 2 tablets (2,000 Units total) by mouth daily.   colestipol (COLESTID) 1 g tablet Take 1-2 tablets (1-2 g total) by mouth 2 (two) times daily as needed.   diclofenac Sodium (VOLTAREN) 1 % GEL Apply 2 g topically 4 (four) times daily. To painful areas of hands.   glucose blood (ACCU-CHEK GUIDE) test strip Use to test blood glucose 3 times  daily. Dx [id number] to test blood glucose 3 times daily. Dx E11.65   Insulin Pen Needle (BD PEN NEEDLE NANO U/F) 32G X 4 MM MISC USE TO INJECT VICTOZA ONCE A DAY AND LANTUS ONCE A DAY AS DIRECTED   lisinopril (ZESTRIL) 20 MG tablet Take 1 tablet (20 mg total) by mouth daily.   loratadine (CLARITIN) 10 MG tablet Take 1 tablet (10 mg total) by mouth daily as needed for allergies. TAKE 1 TABLET(10 MG) BY MOUTH DAILY   metFORMIN (GLUCOPHAGE-XR) 500 MG 24 hr tablet Take 1 tablet (500 mg total) by mouth in the morning and at bedtime.   nitroGLYCERIN (NITROSTAT) 0.4 MG SL tablet PLACE 1 TABLET UNDER THE TONGUE EVERY 5 MINUTES AS NEEDED FOR CHEST PAIN   pantoprazole (PROTONIX) 40 MG tablet Take 1 tablet (40 mg total) by mouth 2 (two) times daily.   PARoxetine (PAXIL) 40 MG tablet Take 1 tablet (40 mg total) by mouth every morning.   pregabalin (LYRICA) 150 MG capsule Take 1 capsule (150 mg total) by mouth every evening. TAKE 1 CAPSULE(150 MG) BY MOUTH DAILY   rosuvastatin (CRESTOR) 20 MG tablet Take 1 tablet (20 mg total) by mouth  daily.   Semaglutide,0.25 or 0.'5MG'$ /DOS, 2 MG/3ML SOPN Inject 0.25 mg into the skin once a week for 30 days, THEN 0.5 mg once a week.   spironolactone (ALDACTONE) 25 MG tablet Take 1 tablet (25 mg total) by mouth daily.   ULTRAM 50 MG tablet Take 1 tablet (50 mg total) by mouth 2 (two) times daily as needed for moderate pain or severe pain.   No facility-administered encounter medications on file as of 12/08/2022.    Allergies (verified) Patient has no known allergies.   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 Katy Fitch annual f/u, most recent 09/2019   CAD, non-obstructive 08/26/2006   Cath 07/05:multiple areas of nonobstructive disease = medical &  RF mgmt, well preserved global systolic function. Dr. Lia Foyer. Nuclear med stress test 01/2014 : Normal stress nuclear study. LV Ejection Fraction: 76%. LV Wall Motion: NL LV Function; NL Wall Motion.       Chronic neck and back pain 01/03/2013   Multifactorial. Non opioid requiring.   L2-5 fusion, with hardware at L2-3, stable per MRI 2010. Followed by neurosurgery (Dr. 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     CKD stage 2 due to type 2 diabetes mellitus ([place]) 12/11/2016   DEPRESSION 08/26/2006   On paxil     Diabetic peripheral neuropathy associated with type 2 diabetes mellitus ([place])    Diabetic retinopathy associated with controlled type 2 diabetes mellitus ([place]) 09/2019   with  macular edema OS - Dr. Katy Fitch   DYSLIPIDEMIA 11/01/2006   Gallstones    GERD 08/26/2006   Heartburn QHS.      Hx of blood transfusion without reported diagnosis    Hx of esophageal dysphagia requiring dilatation. 03/03/2018   hx of esophageal stenosis s/p dilatation    Hx of small intestinal bacterial overgrowth 12/24/2015   Presumed / clinical dx 2016. Complete resolution with metronidazole (insuance would not cover rifaximin) Flare 2017. Did not respond to ABX. Referral to GI B12 and ferritin low supporting malabsorption.    HYPERTENSION 08/26/2006   Hypertensive heart disease with chronic diastolic congestive heart failure ([place]) 05/01/2020   Morbid obesity with BMI of 50.0-59.9, adult ([place])    Personal history of colonic polyps 2020   Adenomatous; f/u 2023 [facility]   PVD (peripheral vascular disease) ([place]), resolved per f/u ABIs 2016 03/27/2014   2015 LE arterial Duplex - Possible mild inflow disease on the left. 0-49% bilateral SFA disease, without focal stenosis. Three vesel run-off, bilaterally. Medical tx.    S/P bilateral cataract extraction    Type 2 diabetes mellitus with complications ([place]) [id number]   Past Surgical History:   Procedure Laterality Date   CATARACT EXTRACTION, BILATERAL     CHOLECYSTECTOMY     pt denies 04/07/16   COLONOSCOPY     endometrial biospy     ESOPHAGEAL DILATION     LEFT HEART CATH     lumbar fusion surgery  10/08   L3-L5   POLYPECTOMY     posterior lumbar interfusion surgery  07/18/07   L2-L3   rotator cuff surgery Bilateral    Family History  Problem Relation Age of Onset   Cirrhosis Mother    Diabetes  Father    Hypertension Father    Hypertension Sister    Diabetes Sister    Hyperlipidemia Sister    Colon cancer Neg Hx    Throat cancer Neg Hx    Esophageal cancer Neg Hx    Rectal cancer Neg Hx    Stomach cancer Neg Hx    Colon polyps Neg Hx    Social History   Socioeconomic History   Marital status: Single    Spouse name: Not on file   Number of children: 2   Years of education: Not on file   Highest education level: Not on file  Occupational History   Not on file  Tobacco Use   Smoking status: Never    Passive exposure: Yes   Smokeless tobacco: Never  Vaping Use   Vaping Use: Never used  Substance and Sexual Activity   Alcohol use: No    Alcohol/week: 0.0 standard drinks of alcohol   Drug use: No   Sexual activity: Not Currently  Other Topics Concern   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 Has two daughters and several grand kids. Severe limitation in ambulation. Occ goes out to church but usually daughter gets groceries.    She is one of 7 kids and the oldest so helped raise the other 6.   Social Determinants of Health   Financial Resource Strain: Low Risk  (12/08/2022)   Overall Financial Resource Strain (CARDIA)    Difficulty of Paying Living Expenses: Not hard at all  Food Insecurity: No Food Insecurity (12/08/2022)   Hunger Vital Sign    Worried About Running Out of Food in the Last Year: Never true    Ran Out of Food in the Last Year: Never true  Transportation Needs: Unmet Transportation Needs  (07/08/2022)   PRAPARE - Transportation    Lack of Transportation (Medical): Yes    Lack of Transportation (Non-Medical): Yes  Physical Activity: Inactive (12/08/2022)   Exercise Vital Sign    Days of Exercise per Week: 0 days    Minutes of Exercise per Session: 0 min  Stress: No Stress Concern Present (12/08/2022)   [place]    Feeling of Stress : Not at all  Social Connections: Moderately Isolated (12/08/2022)   Social Connection and Isolation Panel [NHANES]    Frequency of Communication with Friends and Family: More than three times a week    Frequency of Social Gatherings with Friends and Family: More than three times a week    Attends Religious Services: 1 to 4 times per year    Active Member of [employer] or Organizations: No    Attends Music therapist: Never    Marital Status: Never married    Tobacco Counseling Counseling given: Not Answered   Clinical Intake:  Pre-visit preparation completed: Yes  Pain : 0-10 Pain Score: 9  Pain Type: Chronic pain Pain Location: Back Pain Orientation: Lower Pain Radiating Towards: goes to the thighs Pain Descriptors / Indicators: Aching Pain Onset: More than a month ago Pain Frequency: Intermittent Pain Relieving Factors: tylenol Effect of Pain on Daily Activities: walking, sitting, and sleep  Pain Relieving Factors: tylenol  BMI - recorded: 38.17 Nutritional Status: BMI > 30  Obese Nutritional Risks: None Diabetes: Yes CBG done?: Yes CBG resulted in Enter/ Edit results?: Yes Did pt. bring in CBG monitor from home?: Yes Glucose Meter Downloaded?: Yes  How  often do you need to have someone help you when you read instructions, pamphlets, or other written materials from your doctor or pharmacy?: 2 - Rarely What is the last grade level you completed in school?: 6th grade  Diabetic?yes  Interpreter Needed?: No  Information entered by :: Carless Slatten,  [place] 12/08/2022   Activities of Daily Living    12/08/2022   11:56 AM 12/08/2022   11:01 AM  In your present state of health, do you have any difficulty performing the following activities:  Hearing? 0 0  Vision? 0 0  Difficulty concentrating or making decisions? 0 0  Walking or climbing stairs? 1 1  Dressing or bathing? 0 0  Doing errands, shopping? 1 1  Preparing Food and eating ? N   Using the Toilet? N   In the past six months, have you accidently leaked urine? N   Do you have problems with loss of bowel control? N   Managing your Medications? N   Managing your Finances? N   Housekeeping or managing your Housekeeping? N     Patient Care Team: Angelica Pou, MD as PCP - General (Internal Medicine) Dorothy Spark, MD as PCP - Cardiology (Cardiology)  Indicate any recent Medical Services you may have received from other than [facility] providers in the past year (date may be approximate).     Assessment:   This is a routine wellness examination for [name] [name].  Hearing/Vision screen No results found.  Dietary issues and exercise activities discussed:     Goals Addressed   None   Depression Screen    12/08/2022   11:58 AM 12/08/2022   11:56 AM 11/09/2022    8:49 AM 07/02/2022    1:20 PM 04/02/2022    9:31 AM 12/25/2021   10:00 AM 06/19/2021   11:34 AM  PHQ 2/9 Scores  PHQ - 2 Score 0 0 0 0 0 0 0    Fall Risk    12/08/2022   11:55 AM 12/08/2022   11:01 AM 11/09/2022    8:46 AM 07/02/2022   10:51 AM 04/02/2022    9:30 AM  Fall Risk   Falls in the past year? 0 0 0 0 0  Number falls in past yr: 0 0 0 0 0  Injury with Fall? 0 0 0 0 0  Risk for fall due to : No Fall Risks  Impaired balance/gait    Follow up Falls evaluation completed Falls evaluation completed Falls evaluation completed;Falls prevention discussed Falls evaluation completed Falls evaluation completed    FALL RISK PREVENTION PERTAINING TO THE HOME:  Any stairs in or around the home? No  If so, are  there any without handrails? Yes  Home free of loose throw rugs in walkways, pet beds, electrical cords, etc? Yes  Adequate lighting in your home to reduce risk of falls? Yes   ASSISTIVE DEVICES UTILIZED TO PREVENT FALLS:  Life alert? Yes  Use of a cane, walker or w/c? Yes  Grab bars in the bathroom? No  Shower chair or bench in shower? Yes  Elevated toilet seat or a handicapped toilet? No   TIMED UP AND GO:  Was the test performed? No .  Length of time to ambulate 10 feet: n/a sec.   Gait steady and fast with assistive device  Cognitive Function:        12/08/2022   11:56 AM  6CIT Screen  What Year? 0 points  What month? 0 points  What time? 0  points  Count back from 20 0 points  Months in reverse 0 points  Repeat phrase 0 points  Total Score 0 points    Immunizations Immunization History  Administered Date(s) Administered   Fluad Quad(high Dose 65+) 06/19/2021, 07/02/2022   Influenza Split 08/25/2011, 07/15/2012   Influenza Whole 08/28/2008, 07/25/2009, 08/15/2010   Influenza,inj,Quad PF,6+ Mos 07/25/2013, 07/12/2014, 07/04/2015, 06/17/2017, 08/04/2018, 08/08/2020   Influenza-Unspecified 08/05/2016   PFIZER(Purple Top)SARS-COV-2 Vaccination 10/31/2019, 11/21/2019   Pneumococcal Conjugate-13 01/17/2015   Pneumococcal Polysaccharide-23 02/06/2010, 01/03/2013   Td 08/15/2010   Tdap 08/08/2020   Zoster, Live 02/03/2013    TDAP status: Up to date  Flu Vaccine status: Up to date  Pneumococcal vaccine status: Up to date  Covid-19 vaccine status: Completed vaccines  Qualifies for Shingles Vaccine? Yes   Zostavax completed No   Shingrix Completed?: No.    Education has been provided regarding the importance of this vaccine. Patient has been advised to call insurance company to determine out of pocket expense if they have not yet received this vaccine. Advised may also receive vaccine at local pharmacy or Health Dept. Verbalized acceptance and  understanding.  Screening Tests Health Maintenance  Topic Date Due   Zoster Vaccines- Shingrix (1 of 2) Never done   COVID-19 Vaccine (3 - 2023-24 season) 06/12/2022   COLONOSCOPY (Pts 45-46yr Insurance coverage will need to be confirmed)  05/12/2023 (Originally 04/10/2022)   FOOT EXAM  12/30/2022   HEMOGLOBIN A1C  02/08/2023   Diabetic kidney evaluation - Urine ACR  07/03/2023   Diabetic kidney evaluation - eGFR measurement  08/27/2023   OPHTHALMOLOGY EXAM  10/14/2023   LIPID PANEL  11/25/2023   Medicare Annual Wellness (AWV)  12/09/2023   DTaP/Tdap/Td (3 - Td or Tdap) 08/08/2030   Pneumonia Vaccine 77 Years old  Completed   INFLUENZA VACCINE  Completed   DEXA SCAN  Completed   Hepatitis C Screening  Completed   HPV VACCINES  Aged Out    Health Maintenance  Health Maintenance Due  Topic Date Due   Zoster Vaccines- Shingrix (1 of 2) Never done   COVID-19 Vaccine (3 - 2023-24 season) 06/12/2022    Colorectal cancer screening: Type of screening: Colonoscopy. Completed 04/11/2019. Repeat every 3 years    Bone Density status: Completed 09/12/2012. Results reflect: Bone density results: OSTEOPENIA. Repeat every once years.  Lung Cancer Screening: (Low Dose CT Chest recommended if Age 78-80years, 30 pack-year currently smoking OR have quit w/in 15years.) does not qualify.   Lung Cancer Screening Referral: Defer to PCP.  Additional Screening:  Hepatitis C Screening: does qualify; Completed 03/14/2021  Vision Screening: Recommended annual ophthalmology exams for early detection of glaucoma and other disorders of the eye. Is the patient up to date with their annual eye exam?  Yes  Who is the provider or what is the name of the office in which the patient attends annual eye exams? [facility].  If pt is not established with a provider, would they like to be referred to a provider to establish care? No .   Dental Screening: Recommended annual dental exams for proper oral  hygiene  Community Resource Referral / Chronic Care Management: CRR required this visit?  No   CCM required this visit?  No      Plan:     I have personally reviewed and noted the following in the patient's chart:   Medical and social history Use of alcohol, tobacco or illicit drugs  Current medications and supplements  including opioid prescriptions. Patient is not currently taking opioid prescriptions. Functional ability and status Nutritional status Physical activity Advanced directives List of other physicians Hospitalizations, surgeries, and ER visits in previous 12 months Vitals Screenings to include cognitive, depression, and falls Referrals and appointments  In addition, I have reviewed and discussed with patient certain preventive protocols, quality metrics, and best practice recommendations. A written personalized care plan for preventive services as well as general preventive health recommendations were provided to patient.     [name] [name], CMA   12/08/2022   Nurse Notes: Face to Face, 15 minutes.  [name] [name] , Thank you for taking time to come for your Medicare Wellness Visit. I appreciate your ongoing commitment to your health goals. Please review the following plan we discussed and let me know if I can assist you in the future.   These are the goals we discussed:  Goals      Blood Pressure < 140/90     HEMOGLOBIN A1C < 7.0     LDL CALC < 100        This is a list of the screening recommended for you and due dates:  Health Maintenance  Topic Date Due   Zoster (Shingles) Vaccine (1 of 2) Never done   COVID-19 Vaccine (3 - 2023-24 season) 06/12/2022   Colon Cancer Screening  05/12/2023*   Complete foot exam   12/30/2022   Hemoglobin A1C  02/08/2023   Yearly kidney health urinalysis for diabetes  07/03/2023   Yearly kidney function blood test for diabetes  08/27/2023   Eye exam for diabetics  10/14/2023   Lipid (cholesterol) test  11/25/2023   Medicare  Annual Wellness Visit  12/09/2023   DTaP/Tdap/Td vaccine (3 - Td or Tdap) 08/08/2030   Pneumonia Vaccine  Completed   Flu Shot  Completed   DEXA scan (bone density measurement)  Completed   Hepatitis C Screening: USPSTF Recommendation to screen - Ages 30-79 yo.  Completed   HPV Vaccine  Aged Out  *Topic was postponed. The date shown is not the original due date.

## 2022-12-08 NOTE — Assessment & Plan Note (Signed)
Extensive contiguous darkening and scaling of skin of bilateral inguinal skin folds where abdominal pannus overlies.  Margins of rash at upper thighs with few scattered satellite appearing lesions.  Very itchy; there is lichenified skin change where she has scratched.  Will treated as candida with Nystatin cream for now, but she may also have a tinea cruris (less common).

## 2022-12-08 NOTE — Patient Instructions (Addendum)
Please call [name]  Direct Dial: [phone] about possible transportation resources through your insurance plan.  Start your insulin back - 10 units every day to start.  Go up on your Ozempic to 0.5 every week.  You should have a prescription for Voltaren (diclofenac) gel to use on your hands for pain.    Hand xrays - you will be called for an appointment.  Please answer phone :)  I'll call you when I figure out the correct treatment for your groin rash.  Let's get together in 6-8 weeks.  Dr. Jimmye Norman

## 2022-12-08 NOTE — Assessment & Plan Note (Signed)
Hyperglycemia since regimen changed at last visit.  Trulicity (not covered) changed to ozempic, lantus discontinued (we thought that subdermal nodules in skin of abd and thighs were due to injections, though Ms. Larsen does not feel that this was the case).  Increase Ozempic to 0.5 mg weekly and restart insulin glargine at 10 units to begin, titrate as needed.

## 2022-12-08 NOTE — Assessment & Plan Note (Signed)
Pain continues.  [facility] recently, and she has been referred to ortho.  Today she c/o neck and shoulder pain with shooting "pin pokes" down her arms which makes her jump.  Sometimes hands feel numb, though she has difficulty specifying.  Today AROM of neck in all planes does not trigger these symptoms but she feels tightness in the neck muscles.  Negative Spurlings and modified Spurlings (no increase in pain), but when head is lifted in axial traction she feels complete relief of pain.  Trapezia are in spasm and are tender.  She feels a similar pricking stinging sensation with pressure over muscle knots. Suspect nerve impingement but she may also have muscle trigger points.  We'll see what ortho's opinion is.  She may need MRI C spine.

## 2022-12-08 NOTE — Telephone Encounter (Signed)
Prior Authorization for patient (Insulin [id number]) came through on cover my meds was submitted with last office notes and labs awaiting approval or denial

## 2022-12-08 NOTE — Assessment & Plan Note (Signed)
Pt reports pain in both hands, making gripping difficult.  Tenderness in L wrist which is also swollen compared to R, with perhaps slightly increased warmth.  Finger joint tenderness primarily in PIPs and MCP, fingers 1-4 primarily affected.  PIPs and 3rd finger MCPs are thickened.  Small Heberden's nodes of DIPs present.  No rheumatoid nodules along arm tendons. She has features of both OA and RA - will obtain xrays and rheum panel. Topical diclofenac for discomfort.

## 2022-12-08 NOTE — Patient Instructions (Signed)

## 2022-12-08 NOTE — Assessment & Plan Note (Signed)
TC 175, LDL 94 lab only visit cardiology.  On Crestor 20 mg daily.  Tolerating (no increase liver enzymes or muscle pain).  Will plan to discuss increasing at next close visit.  Today was dedicated to other problems.

## 2022-12-09 NOTE — Telephone Encounter (Signed)
This medication or product is on your plan's list of covered drugs. Prior authorization is not required at this time. If your pharmacy has questions regarding the processing of your prescription, please have them call the [facility] pharmacy help desk at [phone]. **Please note: This request was submitted electronically. Formulary lowering, tiering exception, cost reduction and/or pre-benefit determination review (including prospective Medicare hospice reviews) requests cannot be requested using this method of submission. Providers contact us at [phone] for further assistance.

## 2022-12-10 ENCOUNTER — Telehealth: Payer: Self-pay | Admitting: *Deleted

## 2022-12-10 DIAGNOSIS — E119 Type 2 diabetes mellitus without complications: Secondary | ICD-10-CM

## 2022-12-10 LAB — CYCLIC CITRUL PEPTIDE ANTIBODY, IGG/IGA: Cyclic Citrullin Peptide Ab: 6 units (ref 0–19)

## 2022-12-10 LAB — SEDIMENTATION RATE: Sed Rate: 24 mm/hr (ref 0–40)

## 2022-12-10 LAB — RHEUMATOID FACTOR: Rheumatoid fact SerPl-aCnc: <10 IU/mL (ref <14.0)

## 2022-12-10 LAB — ANA: Anti Nuclear Antibody (ANA): NEGATIVE

## 2022-12-10 NOTE — Telephone Encounter (Signed)
Call from patient stating  that she did not receive her Insulin that was ordered at her last visit.  Call t pharmacy patient did not receive her Semglee as it is not  covered by her Insurance.    Needs a prescription sent in for the Lantus.

## 2022-12-11 MED ORDER — LANTUS SOLOSTAR 100 UNIT/ML ~~LOC~~ SOPN: 15.0000 [IU] | PEN_INJECTOR | Freq: Every day | SUBCUTANEOUS | 2 refills | Status: DC

## 2022-12-11 NOTE — Telephone Encounter (Signed)
Attempts to call patient to notify her that the  Lantus has been sent to her Pharmacy .  Unable to reach or to leave a message.

## 2022-12-14 ENCOUNTER — Telehealth: Payer: Self-pay | Admitting: *Deleted

## 2022-12-14 NOTE — Progress Notes (Signed)
  Care Coordination   Note   12/14/2022 Name: [name] [name] MRN: [id number] DOB: 20-Nov-1945  [name] is a 77 y.o. year old female who sees Jimmye Norman, Elaina Pattee, MD for primary care. I reached out to [employer] by phone today to offer care coordination services.  [name] was given information about Care Coordination services today including:   The Care Coordination services include support from the care team which includes your Nurse Coordinator, Clinical Social Worker, or Pharmacist.  The Care Coordination team is here to help remove barriers to the health concerns and goals most important to you. Care Coordination services are voluntary, and the patient may decline or stop services at any time by request to their care team member.   Care Coordination Consent Status: Patient agreed to services and verbal consent obtained.   Follow up plan:  Telephone appointment with care coordination team member scheduled for:  12/15/22  Encounter Outcome:  Pt. Scheduled  [place]  Direct Dial: [phone]

## 2022-12-14 NOTE — Progress Notes (Signed)
  Care Coordination  Outreach Note  12/14/2022 Name: [name] [name] MRN: [id number] DOB: 1945-12-31   Care Coordination Outreach Attempts: An unsuccessful telephone outreach was attempted today to offer the patient information about available care coordination services as a benefit of their health plan.   Follow Up Plan:  Additional outreach attempts will be made to offer the patient care coordination information and services.   Encounter Outcome:  No Answer  [place]  Direct Dial: [phone]

## 2022-12-15 ENCOUNTER — Telehealth: Payer: Self-pay

## 2022-12-15 ENCOUNTER — Ambulatory Visit: Payer: Self-pay

## 2022-12-15 NOTE — Patient Outreach (Signed)
  Care Coordination   12/15/2022 Name: Neosha Shimel MRN: [id number] DOB: April 06, 1946   Care Coordination Outreach Attempts:  An unsuccessful telephone outreach was attempted for a scheduled appointment today.  Follow Up Plan:  Additional outreach attempts will be made to offer the patient care coordination information and services.   Encounter Outcome:  No Answer   Care Coordination Interventions:  No, not indicated    Daneen Schick, BSW, CDP Social Worker, Certified Dementia Practitioner [facility]  Care Coordination [phone]

## 2022-12-15 NOTE — Patient Instructions (Signed)
Visit Information  Thank you for taking time to visit with me today. Please don't hesitate to contact me if I can be of assistance to you.   Following are the goals we discussed today:   Goals Addressed             This Visit's Progress    COMPLETED: Care Coordination Activities       Care Coordination Interventions: Referral received indicating patient has difficulty with transportation Discussed patient lives with her daughter. Patient reports her grand-daughter assists with transportation but is unavailable on Monday, Wednesday, Friday due to HD. Patient indicates she does not have transportation when her grand-daughter is at dialysis Performed chart review to note patient has [facility] Medicaid and Medicaid Education provided on health plan benefit - patient is knowledgeable of health plan transportation and reports she does access this Education provided on SCAT transportation offered by [employer] - patient endorses use of this resource  Advised the patient she has access to all transportation resources available and will need to access either her health plan benefit or SCAT when her grand-daughter is unavailable - patient stated understanding         If you are experiencing a Mental Health or [place] or need someone to talk to, please call 911  The patient verbalized understanding of instructions, educational materials, and care plan provided today and DECLINED offer to receive copy of patient instructions, educational materials, and care plan.   No further follow up required: Please contact your primary care provider as needed.  Daneen Schick, BSW, CDP Social Worker, Certified Dementia Practitioner [facility]  Care Coordination [phone]

## 2022-12-15 NOTE — Patient Outreach (Signed)
  Care Coordination   Initial Visit Note   12/15/2022 Name: [name] [name] MRN: [id number] DOB: December 15, 1945  [name] [name] is a 77 y.o. year old female who sees Jimmye Norman, Elaina Pattee, MD for primary care. I spoke with  [name] by phone today.  What matters to the patients health and wellness today?  Transportation resources    Goals Addressed             This Visit's Progress    COMPLETED: Care Coordination Activities       Care Coordination Interventions: Referral received indicating patient has difficulty with transportation Discussed patient lives with her daughter. Patient reports her grand-daughter assists with transportation but is unavailable on Monday, Wednesday, Friday due to HD. Patient indicates she does not have transportation when her grand-daughter is at dialysis Performed chart review to note patient has [facility] Medicaid and Medicaid Education provided on health plan benefit - patient is knowledgeable of health plan transportation and reports she does access this Education provided on SCAT transportation offered by [employer] - patient endorses use of this resource  Advised the patient she has access to all transportation resources available and will need to access either her health plan benefit or SCAT when her grand-daughter is unavailable - patient stated understanding         SDOH assessments and interventions completed:  Yes  SDOH Interventions Today    Flowsheet Row Most Recent Value  SDOH Interventions   Food Insecurity Interventions Intervention Not Indicated  Housing Interventions Intervention Not Indicated  Transportation Interventions Payor Benefit, SCAT ([employer])        Care Coordination Interventions:  Yes, provided   Interventions Today    Flowsheet Row Most Recent Value  Chronic Disease   Chronic disease during today's visit Diabetes, Hypertension (HTN), Chronic Kidney Disease/End Stage Renal  Disease (ESRD)  General Interventions   General Interventions Discussed/Reviewed General Interventions Discussed  Education Interventions   Education Provided Provided Education  Provided Verbal Education On [employer], Community Resources        Follow up plan: No further intervention required.   Encounter Outcome:  Pt. Visit Completed   Daneen Schick, BSW, CDP Social Worker, Certified Dementia Practitioner [place] [facility]  Care Coordination [phone]

## 2022-12-21 ENCOUNTER — Telehealth: Payer: Self-pay | Admitting: Internal Medicine

## 2022-12-21 DIAGNOSIS — I2510 Atherosclerotic heart disease of native coronary artery without angina pectoris: Secondary | ICD-10-CM

## 2022-12-21 DIAGNOSIS — E1169 Type 2 diabetes mellitus with other specified complication: Secondary | ICD-10-CM

## 2022-12-21 MED ORDER — ROSUVASTATIN CALCIUM 40 MG PO TABS: 40.0000 mg | ORAL_TABLET | Freq: Every day | ORAL | 3 refills | Status: DC

## 2022-12-21 NOTE — Telephone Encounter (Signed)
Werner Lean, MD 11/29/2022  3:03 PM EST     Results: LDL has increased despite increase in statin Plan: Medication clarification; if she is on rosuvastatin 20 mg and takes it daily we will increase to maximum dose for heart attack prevention.   Called pt advised of results and MD recommendations.  Pt reports is taking rosuvastatin 20 mg advised will increase to  40 mg.  Will call in if develops muscle aches.  Will have repeat labs on 03/25/23.  Appointment reminder mailed out to pt.

## 2022-12-21 NOTE — Telephone Encounter (Signed)
Pt calling for lab results.

## 2022-12-27 ENCOUNTER — Other Ambulatory Visit: Payer: Self-pay | Admitting: Internal Medicine

## 2022-12-30 ENCOUNTER — Ambulatory Visit: Payer: 59 | Admitting: Orthopedic Surgery

## 2022-12-31 ENCOUNTER — Ambulatory Visit: Payer: 59 | Admitting: Orthopedic Surgery

## 2023-01-05 ENCOUNTER — Ambulatory Visit: Payer: 59 | Admitting: Orthopedic Surgery

## 2023-01-06 ENCOUNTER — Ambulatory Visit: Payer: Medicare Other | Admitting: Podiatry

## 2023-01-14 ENCOUNTER — Encounter: Payer: 59 | Admitting: Internal Medicine

## 2023-01-31 ENCOUNTER — Other Ambulatory Visit: Payer: Self-pay | Admitting: Internal Medicine

## 2023-02-01 ENCOUNTER — Other Ambulatory Visit: Payer: Self-pay

## 2023-02-01 DIAGNOSIS — I10 Essential (primary) hypertension: Secondary | ICD-10-CM

## 2023-02-01 MED ORDER — LISINOPRIL 20 MG PO TABS: 20.0000 mg | ORAL_TABLET | Freq: Every day | ORAL | 3 refills | Status: DC

## 2023-02-04 ENCOUNTER — Ambulatory Visit (INDEPENDENT_AMBULATORY_CARE_PROVIDER_SITE_OTHER): Payer: 59 | Admitting: Internal Medicine

## 2023-02-04 VITALS — BP 106/45 | HR 72 | Temp 97.6°F | Ht 63.0 in | Wt 215.1 lb

## 2023-02-04 DIAGNOSIS — M5412 Radiculopathy, cervical region: Secondary | ICD-10-CM

## 2023-02-04 DIAGNOSIS — M19042 Primary osteoarthritis, left hand: Secondary | ICD-10-CM | POA: Diagnosis not present

## 2023-02-04 DIAGNOSIS — E559 Vitamin D deficiency, unspecified: Secondary | ICD-10-CM | POA: Diagnosis not present

## 2023-02-04 DIAGNOSIS — E1140 Type 2 diabetes mellitus with diabetic neuropathy, unspecified: Secondary | ICD-10-CM

## 2023-02-04 DIAGNOSIS — B372 Candidiasis of skin and nail: Secondary | ICD-10-CM | POA: Diagnosis not present

## 2023-02-04 DIAGNOSIS — E1122 Type 2 diabetes mellitus with diabetic chronic kidney disease: Secondary | ICD-10-CM | POA: Diagnosis not present

## 2023-02-04 DIAGNOSIS — I129 Hypertensive chronic kidney disease with stage 1 through stage 4 chronic kidney disease, or unspecified chronic kidney disease: Secondary | ICD-10-CM

## 2023-02-04 DIAGNOSIS — Z794 Long term (current) use of insulin: Secondary | ICD-10-CM | POA: Diagnosis not present

## 2023-02-04 DIAGNOSIS — E1151 Type 2 diabetes mellitus with diabetic peripheral angiopathy without gangrene: Secondary | ICD-10-CM

## 2023-02-04 DIAGNOSIS — F3340 Major depressive disorder, recurrent, in remission, unspecified: Secondary | ICD-10-CM

## 2023-02-04 DIAGNOSIS — Z7985 Long-term (current) use of injectable non-insulin antidiabetic drugs: Secondary | ICD-10-CM | POA: Diagnosis not present

## 2023-02-04 DIAGNOSIS — E119 Type 2 diabetes mellitus without complications: Secondary | ICD-10-CM

## 2023-02-04 DIAGNOSIS — I739 Peripheral vascular disease, unspecified: Secondary | ICD-10-CM

## 2023-02-04 DIAGNOSIS — K9089 Other intestinal malabsorption: Secondary | ICD-10-CM

## 2023-02-04 DIAGNOSIS — N183 Chronic kidney disease, stage 3 unspecified: Secondary | ICD-10-CM

## 2023-02-04 DIAGNOSIS — M19041 Primary osteoarthritis, right hand: Secondary | ICD-10-CM | POA: Diagnosis not present

## 2023-02-04 DIAGNOSIS — R1319 Other dysphagia: Secondary | ICD-10-CM

## 2023-02-04 DIAGNOSIS — E1142 Type 2 diabetes mellitus with diabetic polyneuropathy: Secondary | ICD-10-CM

## 2023-02-04 DIAGNOSIS — M47812 Spondylosis without myelopathy or radiculopathy, cervical region: Secondary | ICD-10-CM

## 2023-02-04 DIAGNOSIS — I10 Essential (primary) hypertension: Secondary | ICD-10-CM

## 2023-02-04 LAB — POCT GLYCOSYLATED HEMOGLOBIN (HGB A1C): Hemoglobin A1C: 11.7 % — AB (ref 4.0–5.6)

## 2023-02-04 LAB — GLUCOSE, CAPILLARY: Glucose-Capillary: 335 mg/dL — ABNORMAL HIGH (ref 70–99)

## 2023-02-04 MED ORDER — PANTOPRAZOLE SODIUM 40 MG PO TBEC: 40.0000 mg | DELAYED_RELEASE_TABLET | Freq: Every day | ORAL | 3 refills | Status: DC

## 2023-02-04 MED ORDER — LANTUS SOLOSTAR 100 UNIT/ML ~~LOC~~ SOPN
16.0000 [IU] | PEN_INJECTOR | Freq: Every day | SUBCUTANEOUS | 2 refills | Status: DC
Start: 2023-02-04 — End: 2023-06-24

## 2023-02-04 MED ORDER — SEMAGLUTIDE (1 MG/DOSE) 4 MG/3ML ~~LOC~~ SOPN: 1.0000 mg | PEN_INJECTOR | SUBCUTANEOUS | 4 refills | Status: DC

## 2023-02-04 NOTE — Assessment & Plan Note (Signed)
Significant hyperglycemia and A1c increased to 11.7 today - at last visit had resumed lantus and increased Ozempic from  0.25 to 0.5 weekly.  Will increase lantus to 16 units nightly and increase Ozempic to 1 mg weekly.

## 2023-02-04 NOTE — Assessment & Plan Note (Signed)
Level remained insufficient at last visit, for which dose was increased. Recheck today.  No falls or near falls.

## 2023-02-04 NOTE — Progress Notes (Signed)
77 year old [name] [name] is here for routine 40-month follow-up, last visit being focused on worsening hand arthritis and adjustments to diabetes regimen.  Her hand pain has responded well to topical Voltaren gel.  She is very disappointed in her diabetes control, experiencing ongoing hyperglycemia.  She anticipates her A1c will be high today.  She has not changed her eating habits.  Most symptomatic issue is ongoing neck pain which we have discussed before and is further outlined in problem based documentation.  BP (!) 106/45 (BP Location: Right Arm, Patient Position: Sitting, Cuff Size: Small)   Pulse 72   Temp 97.6 F (36.4 C) (Oral)   Ht  (1.6 m)   Wt 215 lb 1.6 oz (97.6 kg)   SpO2 100%   BMI 38.10 kg/m  Foot exam: Trace pitting edema around the sock line only.  Foot skin in good condition bilaterally.  No compromise related to her small overriding fourth toes.  DP pulses 1+, PT pulses 0.  Sensation intact to gross touch and monofilament testing and to vibratory sense with no asymmetry.  Toenails trimmed. General: Alert, conversational, positive mood and congruent affect.  Skin turgor slightly reduced overall.  Arrived in transport chair, uses 4-prong cane.  Hand exam: Enlargement of PIPs and MCPs without synovitis.  Decreased flexion of DIPs worse on right hand.  Nearly complete fist on right, complete fist on left.   Right shoulder exam: Tenderness over the point of the right shoulder.  Pain with across the chest abduction.  Reduced range of motion and elevation and an external and internal rotation.  Assessment and plan:  HTN (hypertension) Overly controlled 106/45, HR 72 on lisinopril  qd, atenolol  qd, spironolactone  qd, and amlodipine 5 mg.  No positional dizziness.  BMP today then will advise her on which medication to reduce.  PAD (peripheral artery disease) (HCC) Asymptomatic.  DP pulses both feet 1+.  No skin changes of chronic ischemia.  ASA 81 mg, need better  control of DM.  Monitor.  Hx of esophageal dysphagia requiring dilatation. Symptoms resolved; she will trial reduction in PPI to daily only and monitor symptoms.    Bile salt-induced diarrhea Colestid needed for loose stools very infrequently, every several weeks.  She wishes to maintain prescription for prn use.   Diabetic polyneuropathy associated with type 2 diabetes mellitus (HCC) Again feet are in good condition with intact sensation to touch, monofilament, and vibration testing. Burning pain has not been as problematic as her neck and R shoulder pain lately.  Continue Lyrica.   Arthritis of neck Pain continues and causes uncomfortable sensations down her arms, R> L, particularly when she is "doing". Conservative care hasn't provided relief.  Will proceed with MRI c spine to determine if she has impingement amenable to procedure (she would consider injection or neck surgery).  She does feel that the arms are not as strong as they once were, though her R shoulder also causes discomfort which makes it difficult to differentiate functionally in her daily routine.   Intertrigo of genitocrural region due to Candida species Pruritus resolved, hyperpigmentation remains. Post inflammatory changes, reassurance given.  Depression, major, recurrent, in remission (HCC) (on therapy) Doing well on her Paxil 40 mg daily.  She endorses no signs or symptoms of depression. No change in therapy. Monitor.    Vitamin D deficiency Level remained insufficient at last visit, for which dose was increased. Recheck today.  No falls or near falls.   DM type 2, goal HbA1c <  8% (HCC) Significant hyperglycemia and A1c increased to 11.7 today - at last visit had resumed lantus and increased Ozempic from  0.25 to 0.5 weekly.  Will increase lantus to 16 units nightly and increase Ozempic to 1 mg weekly.    CKD stage 3 due to type 2 diabetes mellitus (HCC) BMP today to monitor.    Arthritis of both hands Rheum panel  was negative.  Improvement with topical voltaren. Exam: enlargement of PIPs and MCPs without synovitis.  Decreased flexion of DIPs worse on right hand.  Nearly complete fist on right, complete fist on left.  She is pleased to have some function back.  Continue prn voltaren.

## 2023-02-04 NOTE — Assessment & Plan Note (Signed)
Doing well on her Paxil 40 mg daily.  She endorses no signs or symptoms of depression. No change in therapy. Monitor.

## 2023-02-04 NOTE — Assessment & Plan Note (Signed)
Pain continues and causes uncomfortable sensations down her arms, R> L, particularly when she is "doing". Conservative care hasn't provided relief.  Will proceed with MRI c spine to determine if she has impingement amenable to procedure (she would consider injection or neck surgery).  She does feel that the arms are not as strong as they once were, though her R shoulder also causes discomfort which makes it difficult to differentiate functionally in her daily routine.

## 2023-02-04 NOTE — Assessment & Plan Note (Signed)
BMP today to monitor. 

## 2023-02-04 NOTE — Assessment & Plan Note (Signed)
Symptoms resolved; she will trial reduction in PPI to daily only and monitor symptoms.

## 2023-02-04 NOTE — Patient Instructions (Addendum)
[name] [name],  The highlights today -  Diabetes is uncontrolled.   Please increase your insulin to 16 units every night Your new dose of Ozempic/semaglutide is 1 mg shot every week.  This will help both your diabetes and weight loss.  Neck and arm pain is suspicious for pinched nerve in your neck.  We will proceed with and MRI.  You will be called to scheduled.  Please reduce your protonix 40 mg to just  1 pill every day.    I'll call you with your test results and let you know which of your blood pressure medicines you can reduce.  See you in 3 months!  Dr. Mayford Knife

## 2023-02-04 NOTE — Assessment & Plan Note (Signed)
Overly controlled 106/45, HR 72 on lisinopril  qd, atenolol  qd, spironolactone  qd, and amlodipine 5 mg.  No positional dizziness.  BMP today then will advise her on which medication to reduce.

## 2023-02-04 NOTE — Assessment & Plan Note (Signed)
Asymptomatic.  DP pulses both feet 1+.  No skin changes of chronic ischemia.  Monitor.

## 2023-02-04 NOTE — Assessment & Plan Note (Signed)
Again feet are in good condition with intact sensation to touch, monofilament, and vibration testing. Burning pain has not been as problematic as her neck and R shoulder pain lately.  Continue Lyrica.

## 2023-02-04 NOTE — Assessment & Plan Note (Signed)
Pruritus resolved, hyperpigmentation remains. Post inflammatory changes, reassurance given.

## 2023-02-04 NOTE — Assessment & Plan Note (Addendum)
Rheum panel was negative.  Improvement with topical voltaren. Exam: enlargement of PIPs and MCPs without synovitis.  Decreased flexion of DIPs worse on right hand.  Nearly complete fist on right, complete fist on left.  [name] [name] is pleased to have some function back.  Continue prn voltaren.

## 2023-02-04 NOTE — Assessment & Plan Note (Signed)
Colestid needed for loose stools very infrequently, every several weeks.  She wishes to maintain prescription for prn use.

## 2023-02-05 ENCOUNTER — Telehealth: Payer: Self-pay | Admitting: Internal Medicine

## 2023-02-05 LAB — VITAMIN D 25 HYDROXY (VIT D DEFICIENCY, FRACTURES): Vit D, 25-Hydroxy: 54.5 ng/mL (ref 30.0–100.0)

## 2023-02-05 LAB — BASIC METABOLIC PANEL
BUN/Creatinine Ratio: 19 (ref 12–28)
BUN: 24 mg/dL (ref 8–27)
CO2: 24 mmol/L (ref 20–29)
Calcium: 9.4 mg/dL (ref 8.7–10.3)
Chloride: 96 mmol/L (ref 96–106)
Creatinine, Ser: 1.27 mg/dL — ABNORMAL HIGH (ref 0.57–1.00)
Glucose: 324 mg/dL — ABNORMAL HIGH (ref 70–99)
Potassium: 4.6 mmol/L (ref 3.5–5.2)
Sodium: 137 mmol/L (ref 134–144)
eGFR: 44 mL/min/{1.73_m2} — ABNORMAL LOW (ref >59)

## 2023-02-05 NOTE — Telephone Encounter (Signed)
Called to inform her of lab results and instructed to stop spironolactone.  She identified the medicine on her list and voiced understanding.

## 2023-02-15 ENCOUNTER — Other Ambulatory Visit: Payer: Self-pay | Admitting: Internal Medicine

## 2023-02-17 ENCOUNTER — Ambulatory Visit (INDEPENDENT_AMBULATORY_CARE_PROVIDER_SITE_OTHER): Payer: 59 | Admitting: Podiatry

## 2023-02-17 DIAGNOSIS — Z91199 Patient's noncompliance with other medical treatment and regimen due to unspecified reason: Secondary | ICD-10-CM

## 2023-02-19 NOTE — Progress Notes (Signed)
1. No-show for appointment     

## 2023-03-01 ENCOUNTER — Other Ambulatory Visit: Payer: Self-pay

## 2023-03-01 ENCOUNTER — Other Ambulatory Visit: Payer: Self-pay | Admitting: Internal Medicine

## 2023-03-01 DIAGNOSIS — E119 Type 2 diabetes mellitus without complications: Secondary | ICD-10-CM

## 2023-03-01 DIAGNOSIS — E118 Type 2 diabetes mellitus with unspecified complications: Secondary | ICD-10-CM

## 2023-03-01 MED ORDER — ACCU-CHEK GUIDE VI STRP
ORAL_STRIP | 10 refills | Status: DC
Start: 2023-03-01 — End: 2023-03-01

## 2023-03-01 MED ORDER — BD PEN NEEDLE NANO U/F 32G X 4 MM MISC: 5 refills | Status: DC

## 2023-03-05 ENCOUNTER — Ambulatory Visit (HOSPITAL_COMMUNITY)
Admission: RE | Admit: 2023-03-05 | Discharge: 2023-03-05 | Disposition: A | Discharge status: Home / Self Care | Payer: 59 | Source: Ambulatory Visit | Attending: Internal Medicine | Admitting: Internal Medicine

## 2023-03-05 DIAGNOSIS — M5412 Radiculopathy, cervical region: Secondary | ICD-10-CM | POA: Insufficient documentation

## 2023-03-05 DIAGNOSIS — M4802 Spinal stenosis, cervical region: Secondary | ICD-10-CM | POA: Diagnosis not present

## 2023-03-11 MED ORDER — ACCU-CHEK GUIDE VI STRP
ORAL_STRIP | 10 refills | Status: DC
Start: 2023-03-11 — End: 2023-06-24

## 2023-03-15 ENCOUNTER — Other Ambulatory Visit: Payer: Self-pay | Admitting: Internal Medicine

## 2023-03-15 ENCOUNTER — Telehealth: Payer: Self-pay | Admitting: Internal Medicine

## 2023-03-15 DIAGNOSIS — G8929 Other chronic pain: Secondary | ICD-10-CM

## 2023-03-15 DIAGNOSIS — Q742 Other congenital malformations of lower limb(s), including pelvic girdle: Secondary | ICD-10-CM

## 2023-03-15 DIAGNOSIS — M25351 Other instability, right hip: Secondary | ICD-10-CM

## 2023-03-15 NOTE — Telephone Encounter (Signed)
Called to relay MRI findings to [name] [name] - she wishes to postpone decision on NS referral until we address her more acute problem, worsening R hip pain with new instability "like it's going to come out of joint", making ambulation very difficulty.  Xray order placed.

## 2023-03-23 ENCOUNTER — Ambulatory Visit (INDEPENDENT_AMBULATORY_CARE_PROVIDER_SITE_OTHER): Payer: 59 | Admitting: Podiatry

## 2023-03-23 DIAGNOSIS — Z91199 Patient's noncompliance with other medical treatment and regimen due to unspecified reason: Secondary | ICD-10-CM

## 2023-03-23 NOTE — Progress Notes (Signed)
1. No-show for appointment     

## 2023-03-25 ENCOUNTER — Ambulatory Visit: Payer: 59 | Attending: Internal Medicine

## 2023-03-25 DIAGNOSIS — I2510 Atherosclerotic heart disease of native coronary artery without angina pectoris: Secondary | ICD-10-CM | POA: Diagnosis not present

## 2023-03-25 DIAGNOSIS — E785 Hyperlipidemia, unspecified: Secondary | ICD-10-CM | POA: Diagnosis not present

## 2023-03-25 DIAGNOSIS — E1169 Type 2 diabetes mellitus with other specified complication: Secondary | ICD-10-CM

## 2023-03-26 LAB — LIPID PANEL
Chol/HDL Ratio: 2.1 ratio (ref 0.0–4.4)
Cholesterol, Total: 101 mg/dL (ref 100–199)
HDL: 47 mg/dL (ref >39)
LDL Chol Calc (NIH): 33 mg/dL (ref 0–99)
Triglycerides: 117 mg/dL (ref 0–149)
VLDL Cholesterol Cal: 21 mg/dL (ref 5–40)

## 2023-03-26 LAB — ALT: ALT: 19 IU/L (ref 0–32)

## 2023-04-13 ENCOUNTER — Other Ambulatory Visit: Payer: Self-pay | Admitting: Internal Medicine

## 2023-04-13 ENCOUNTER — Ambulatory Visit: Payer: 59 | Admitting: Internal Medicine

## 2023-04-13 ENCOUNTER — Telehealth: Payer: Self-pay

## 2023-04-13 VITALS — BP 101/61 | HR 73 | Temp 97.8°F | Ht 63.0 in | Wt 211.8 lb

## 2023-04-13 DIAGNOSIS — I152 Hypertension secondary to endocrine disorders: Secondary | ICD-10-CM | POA: Diagnosis not present

## 2023-04-13 DIAGNOSIS — N183 Chronic kidney disease, stage 3 unspecified: Secondary | ICD-10-CM

## 2023-04-13 DIAGNOSIS — K047 Periapical abscess without sinus: Secondary | ICD-10-CM

## 2023-04-13 DIAGNOSIS — I110 Hypertensive heart disease with heart failure: Secondary | ICD-10-CM

## 2023-04-13 DIAGNOSIS — I5032 Chronic diastolic (congestive) heart failure: Secondary | ICD-10-CM | POA: Diagnosis not present

## 2023-04-13 DIAGNOSIS — K9089 Other intestinal malabsorption: Secondary | ICD-10-CM | POA: Diagnosis not present

## 2023-04-13 DIAGNOSIS — E1122 Type 2 diabetes mellitus with diabetic chronic kidney disease: Secondary | ICD-10-CM | POA: Diagnosis not present

## 2023-04-13 DIAGNOSIS — E1159 Type 2 diabetes mellitus with other circulatory complications: Secondary | ICD-10-CM | POA: Diagnosis not present

## 2023-04-13 DIAGNOSIS — E119 Type 2 diabetes mellitus without complications: Secondary | ICD-10-CM

## 2023-04-13 DIAGNOSIS — I130 Hypertensive heart and chronic kidney disease with heart failure and stage 1 through stage 4 chronic kidney disease, or unspecified chronic kidney disease: Secondary | ICD-10-CM

## 2023-04-13 HISTORY — DX: Periapical abscess without sinus: K04.7

## 2023-04-13 LAB — POCT GLYCOSYLATED HEMOGLOBIN (HGB A1C): Hemoglobin A1C: 10 % — AB (ref 4.0–5.6)

## 2023-04-13 LAB — GLUCOSE, CAPILLARY: Glucose-Capillary: 175 mg/dL — ABNORMAL HIGH (ref 70–99)

## 2023-04-13 NOTE — Assessment & Plan Note (Signed)
R upper molar treated with antibiotic; extraction visit must be paid up front and insurance is billed later - she doesn't have money for this.  We attempted today to provide additional information including another dentist office she might try, and left message for [facility] dental clinic (no answer).  There is a practice which has an appt available in 07/2023 which is unreasonable!

## 2023-04-13 NOTE — Assessment & Plan Note (Addendum)
101/61 sitting today, no positional dizziness; has not taken her medications.  Not eating as much due to increased GLP1a and tooth pain.  Doesn't know her meds by name.  At last visit I had stopped her spironolactone; she should be taking lisinopril 20 mg daily, amlodipine 5 mg daily, and atenolol 100 mg daily.  Check BMP today.  Will ask her to stop amlodipine now.  (It's long half life means it was probably still somewhat therapeutic this am).  Recheck BP only in a week (she should be able to get her hip xray done on the same day).

## 2023-04-13 NOTE — Progress Notes (Signed)
Ms. Lilla is here for routine close f/u of her chronic conditions including back and neck pain, and more recently R hip pain - for which she had been waiting for a call to schedule her xray, not realizing that she could walk in to hve it done.  Unable to complete today due to transportation issues.  She continues to have all of her chronic pain issues, but new problem is tooth ache due to upper R tooth infection, treated with antibiotics; she has [place] insurance but must pay entirety up front > 300 $ before insurance is even filed.  An extraction is scheduled for 04/19/23.  Abx name not recalled.  BP 101/61 (BP Location: Left Arm, Patient Position: Sitting, Cuff Size: Small)   Pulse 73   Temp 97.8 F (36.6 C) (Oral)   Ht 5\' 3"  (1.6 m)   Wt 211 lb 12.8 oz (96.1 kg)   SpO2 100%   BMI 37.52 kg/m  Alert, pleasant, but not comfortable-appearing.  R cheek is visibly swollen but not warm.  Lungs with dependent rales posteriorly (asymptomatic when supine) and lower legs with 1+ pitting edema.  Poor skin turgor over arms and torso. Stands with difficulty, walks slowly with cane in R favoring R hip, but steady.    Assessment and plan:  Dental infection R upper molar treated with antibiotic; extraction visit must be paid up front and insurance is billed later - she doesn't have money for this.  We attempted today to provide additional information including another dentist office she might try, and left message for [facility] dental clinic (no answer).  There is a practice which has an appt available in 07/2023 which is unreasonable!    Hypertension associated with diabetes (HCC), overly controlled 101/61 sitting today, no positional dizziness; has not taken her medications.  Not eating as much due to increased GLP1a and tooth pain.  Doesn't know her meds by name.  At last visit I had stopped her spironolactone; she should be taking lisinopril 20 mg daily, amlodipine 5 mg daily, and atenolol 100 mg daily.  Check  BMP today.  Will ask her to stop amlodipine now.  (It's long half life means it was probably still somewhat therapeutic this am).  Recheck BP only in a week (she should be able to get her hip xray done on the same day).   Hypertensive heart disease with chronic diastolic congestive heart failure (HCC) While asymptomatic, she does have rales in posterior bases which have not been apparent.  BP wouldn't allow diuretic at this point anyway so will watch closely for now.  On no NSAIDs to my knowledge (she is usually very good about avoiding). Salt intake?  CKD stage 3a due to type 2 diabetes mellitus (HCC) eGFR last visit 44, has been gradually declining over past year.  Repeat today, with concern about possible dehydration from decreased oral intake in setting of dental infection.  DM type 2, goal HbA1c < 8% (HCC) A1c today 10 from 11.7 last visit.  Tolerating Ozempic increased to 1 at last visit, voices adherence to lantus increased to 16 units.  Much better control, with the exception of mid June when hyperglycemia developed during dental infection (since treated with abx).  Fastings within reasonable range though room for improvement.  Increase lantus to 18 units; most important is for tooth to be extracted. Avoiding meal time insulin if at all possible.  Bile salt-induced diarrhea Taking colestid nearly every day, otherwise she will experience diarrhea.  Have stopped  metformin in the past with no improvement.  She feels the medication is effective enough when she takes it.   RTC for nurse only visit in 1 week (she can also get her hip xray that day); clinic f/u in 22m, earlier if needed.

## 2023-04-13 NOTE — Telephone Encounter (Signed)
Patient called regarding [employer] dental assistance she stated the dental office does not due oral surgery and they are trying to refer her [facility], please return patients call

## 2023-04-13 NOTE — Assessment & Plan Note (Signed)
Taking colestid nearly every day, otherwise she will experience diarrhea.  Have stopped metformin in the past with no improvement.  She feels the medication is effective enough when she takes it.

## 2023-04-13 NOTE — Telephone Encounter (Signed)
I called pt who stated the dentist's name (located on [address]) that her doctor gave her morning is not an oral surgeon only pulls teeth and does not accept Medicare. She stated she needs an oral surgeon (to be put to sleep) and she does not want to [facility].

## 2023-04-13 NOTE — Assessment & Plan Note (Signed)
While asymptomatic, she does have rales in posterior bases which have not been apparent.  BP wouldn't allow diuretic at this point anyway so will watch closely for now.  On no NSAIDs to my knowledge (she is usually very good about avoiding). Salt intake?

## 2023-04-13 NOTE — Assessment & Plan Note (Signed)
eGFR last visit 44, has been gradually declining over past year.  Repeat today, with concern about possible dehydration from decreased oral intake in setting of dental infection.

## 2023-04-13 NOTE — Patient Instructions (Addendum)
Ms. Benston,   Always good to see you!  Your blood pressure is too low.  Please stop the AMLODIPINE.    Blood sugar is too high in the mornings.  Please increase your LANTUS to 18 units every evening.  [employer] are an option for your tooth extraction.  They take [place] insurance. Please call [phone] [address]  I hope your tooth can be taken care of soon!  We are checking your kidney function today.  I'll call you with the results.  Take care and please try to get that hip xray!  See you in 3 months (earlier if needed.  Dr. Mayford Knife

## 2023-04-13 NOTE — Assessment & Plan Note (Addendum)
A1c today 10 from 11.7 last visit.  Tolerating Ozempic increased to 1 at last visit, voices adherence to lantus increased to 16 units.  Much better control, with the exception of mid June when hyperglycemia developed during dental infection (since treated with abx).  Fastings within reasonable range though room for improvement.  Increase lantus to 18 units; most important is for tooth to be extracted. Avoiding meal time insulin if at all possible.

## 2023-04-14 LAB — BMP8+ANION GAP
Anion Gap: 19 mmol/L — ABNORMAL HIGH (ref 10.0–18.0)
BUN/Creatinine Ratio: 18 (ref 12–28)
BUN: 32 mg/dL — ABNORMAL HIGH (ref 8–27)
CO2: 20 mmol/L (ref 20–29)
Calcium: 9.7 mg/dL (ref 8.7–10.3)
Chloride: 102 mmol/L (ref 96–106)
Creatinine, Ser: 1.8 mg/dL — ABNORMAL HIGH (ref 0.57–1.00)
Glucose: 187 mg/dL — ABNORMAL HIGH (ref 70–99)
Potassium: 4.9 mmol/L (ref 3.5–5.2)
Sodium: 141 mmol/L (ref 134–144)
eGFR: 28 mL/min/{1.73_m2} — ABNORMAL LOW (ref >59)

## 2023-04-21 ENCOUNTER — Ambulatory Visit (HOSPITAL_COMMUNITY)
Admission: RE | Admit: 2023-04-21 | Discharge: 2023-04-21 | Disposition: A | Discharge status: Home / Self Care | Payer: 59 | Source: Ambulatory Visit | Attending: Internal Medicine | Admitting: Internal Medicine

## 2023-04-21 DIAGNOSIS — M25551 Pain in right hip: Secondary | ICD-10-CM | POA: Diagnosis not present

## 2023-04-21 DIAGNOSIS — M25552 Pain in left hip: Secondary | ICD-10-CM | POA: Diagnosis not present

## 2023-04-21 DIAGNOSIS — G8929 Other chronic pain: Secondary | ICD-10-CM | POA: Diagnosis not present

## 2023-04-27 ENCOUNTER — Other Ambulatory Visit: Payer: Self-pay | Admitting: Physician Assistant

## 2023-04-27 DIAGNOSIS — R490 Dysphonia: Secondary | ICD-10-CM | POA: Diagnosis not present

## 2023-04-27 DIAGNOSIS — R1310 Dysphagia, unspecified: Secondary | ICD-10-CM

## 2023-05-04 ENCOUNTER — Ambulatory Visit (INDEPENDENT_AMBULATORY_CARE_PROVIDER_SITE_OTHER): Payer: 59 | Admitting: Podiatry

## 2023-05-04 ENCOUNTER — Encounter: Payer: Self-pay | Admitting: Podiatry

## 2023-05-04 DIAGNOSIS — E1142 Type 2 diabetes mellitus with diabetic polyneuropathy: Secondary | ICD-10-CM | POA: Diagnosis not present

## 2023-05-04 DIAGNOSIS — L84 Corns and callosities: Secondary | ICD-10-CM | POA: Diagnosis not present

## 2023-05-04 DIAGNOSIS — M79675 Pain in left toe(s): Secondary | ICD-10-CM | POA: Diagnosis not present

## 2023-05-04 DIAGNOSIS — B351 Tinea unguium: Secondary | ICD-10-CM

## 2023-05-04 DIAGNOSIS — M79674 Pain in right toe(s): Secondary | ICD-10-CM

## 2023-05-05 ENCOUNTER — Encounter: Payer: Self-pay | Admitting: Orthopedic Surgery

## 2023-05-05 ENCOUNTER — Other Ambulatory Visit (INDEPENDENT_AMBULATORY_CARE_PROVIDER_SITE_OTHER): Payer: 59

## 2023-05-05 ENCOUNTER — Ambulatory Visit (INDEPENDENT_AMBULATORY_CARE_PROVIDER_SITE_OTHER): Payer: 59 | Admitting: Orthopedic Surgery

## 2023-05-05 VITALS — BP 131/80 | HR 80 | Ht 65.0 in | Wt 211.0 lb

## 2023-05-05 DIAGNOSIS — M545 Low back pain, unspecified: Secondary | ICD-10-CM

## 2023-05-05 NOTE — Progress Notes (Signed)
Orthopedic Spine Surgery Office Note  Assessment: Patient is a 77 y.o. female with low back pain that radiates into the posterior right thigh and periodically the left posterior thigh.  Has disc at loss at L5/S1.  Also has PI-LL mismatch   Plan: -Explained that initially conservative treatment is tried as a significant number of patients may experience relief with these treatment modalities. Discussed that the conservative treatments include:  -activity modification  -physical therapy  -over the counter pain medications  -medrol dosepak  -gabapentin/lyrica  -lumbar steroid injections -Patient has tried activity modification  -Recommended PT and tylenol since she has not tried another other treatments recently, but she was not interested in either. After covering all of the above, she had objections to each so we decided that pain management might be the best option for her. Referral was provided to her today -Encouraged weight loss as this could help with her pain as well -She is not a good candidate for surgery given her A1C. She is also not interested in surgery at this time either -Would need an A1C of 7.5 or less prior to elective spine surgery -Patient should return to office on an as needed basis   Patient expressed understanding of the plan and all questions were answered to the patient's satisfaction.   ___________________________________________________________________________   History:  Patient is a 77 y.o. female who presents today for lumbar spine.  Patient has had 2 prior lumbar spine surgeries.  She states she initially did well after the surgeries but has noticed progressively worsening low back pain that radiates into the right lower extremity.  She feels it along the right buttock and right posterior thigh.  It sometimes radiates past the knee on the right side.  She also sometimes has pain into the left posterior thigh.  This pain is not as constant as the right leg.   She feels pain on a daily basis.  There is no trauma or injury that preceded the onset of pain.   Weakness: Denies Symptoms of imbalance: Denies Paresthesias and numbness: Yes, in feet bilaterally from diabetic neuropathy Bowel or bladder incontinence: Denies Saddle anesthesia: Denies  Treatments tried: Activity modification  Review of systems: Denies fevers and chills, night sweats, unexplained weight loss, history of cancer.  Has had pain that wakes her at night  Past medical history: Hypertension CAD History of MI Crohn's disease GERD Migraines Anxiety/Depression CKD Hepatitis DM with peripheral neuropathy (last A1C was 10 on 04/13/23) Osteoarthritis  Allergies: NKDA   Past surgical history:  2 prior lumbar surgeries, now has L2-L5 spinal fusion Bilateral cataract surgery Cholecystectomy Polypectomy Bilateral rotator cuff repair Esophageal dilation   Social history: Denies use of nicotine product (smoking, vaping, patches, smokeless) Alcohol use: Denies Denies recreational drug use   Physical Exam:  BMI of 35.1  General: no acute distress, appears stated age, ambulates without assistive device Neurologic: alert, answering questions appropriately, following commands Respiratory: unlabored breathing on room air, symmetric chest rise Psychiatric: appropriate affect, normal cadence to speech   MSK (spine):  -Strength exam      Left  Right EHL    5/5  5/5 TA    5/5  5/5 GSC    5/5  5/5 Knee extension  5/5  5/5 Hip flexion   5/5  5/5  -Sensory exam    Sensation intact to light touch in L3-S1 nerve distributions of bilateral lower extremities   -Straight leg raise: Negative bilaterally -Clonus: no beats bilaterally  -Left hip exam:  No pain through range of motion -Right hip exam: No pain through range of motion  Imaging: XR of the lumbar spine from 05/05/2023 was independently reviewed and interpreted, showing no fracture or dislocation.  There  are interbody devices from L4-L5.  No lucency seen around the interbody devices.  No evidence of instability on flexion/extension views.  There is posterior instrumentation at L2/3 with no lucency around the screws.  Screws not backed out.  Disc height loss at L5/S1.  PI of 65, LL of 20.    Patient name: [name] [name] Patient MRN: [id number] Date of visit: 05/05/23

## 2023-05-07 ENCOUNTER — Other Ambulatory Visit: Payer: Self-pay

## 2023-05-07 DIAGNOSIS — F325 Major depressive disorder, single episode, in full remission: Secondary | ICD-10-CM

## 2023-05-07 MED ORDER — PAROXETINE HCL 40 MG PO TABS: 40.0000 mg | ORAL_TABLET | Freq: Every morning | ORAL | 3 refills | Status: DC

## 2023-05-09 NOTE — Progress Notes (Signed)
  Subjective:  Patient ID: [name] [name], female    DOB: [birth year],  MRN: [id number]  Artie Canepa presents to clinic today for: at risk foot care with history of diabetic neuropathy and callus(es) left foot and painful thick toenails that are difficult to trim. Painful toenails interfere with ambulation. Aggravating factors include wearing enclosed shoe gear. Pain is relieved with periodic professional debridement. Painful calluses are aggravated when weightbearing with and without shoegear. Pain is relieved with periodic professional debridement.  Chief Complaint  Patient presents with   Diabetes    [facility] BS - 145 A1C - DK LVPCP - 03/2023    PCP is Miguel Aschoff, MD.  No Known Allergies  Review of Systems: Negative except as noted in the HPI.  Objective: No changes noted in today's physical examination. There were no vitals filed for this visit.  [name] [name] is a pleasant 77 y.o. female in NAD. AAO x 3.  Vascular Examination: Capillary refill time <3 seconds b/l LE. Palpable DP pulses b/l LE; nonpalpable PT pulses b/l. Digital hair sparse b/l. No pedal edema b/l. Skin temperature gradient WNL b/l. No varicosities b/l. No cyanosis or clubbing noted b/l LE.[name]  Dermatological Examination: Pedal skin with normal turgor, texture and tone b/l. No open wounds. No interdigital macerations b/l. Toenails 1-5 b/l thickened, discolored, dystrophic with subungual debris. There is pain on palpation to dorsal aspect of nailplates. Hyperkeratotic lesion(s) submet head 1 left foot.  No erythema, no edema, no drainage, no fluctuance..  Neurological Examination: Pt has subjective symptoms of neuropathy. Protective sensation diminished with 10g monofilament b/l. Proprioception intact bilaterally.  Musculoskeletal Examination: Normal muscle strength 5/5 to all lower extremity muscle groups bilaterally. No pain, crepitus or joint limitation noted with ROM b/l LE. Brachymetatarsia 4th  metatarsal head b/l lower extremities. Pes planus deformity noted bilateral LE.[name] Patient ambulates with cane assistance.     Latest Ref Rng & Units 04/13/2023   12:05 PM 02/04/2023    9:38 AM 11/09/2022    8:50 AM 07/02/2022   12:22 PM  Hemoglobin A1C  Hemoglobin-A1c 4.0 - 5.6 % 10.0  11.7  8.5  7.5    Assessment/Plan: 1. Pain due to onychomycosis of toenails of both feet   2. Callus   3. Diabetic peripheral neuropathy associated with type 2 diabetes mellitus (HCC)     -Consent given for treatment as described below: -Examined patient. -Stressed the importance of good glycemic control and the detriment of not  controlling glucose levels in relation to the foot. -Toenails 1-5 b/l were debrided in length and girth with sterile nail nippers and dremel without iatrogenic bleeding.  -Callus(es) submet head 1 left foot pared utilizing sterile scalpel blade without complication or incident. Total number debrided =1. -Patient/POA to call should there be question/concern in the interim.   Return in about 3 months (around 08/04/2023).  [name] [name], DPM

## 2023-05-11 ENCOUNTER — Other Ambulatory Visit: Payer: Self-pay | Admitting: Internal Medicine

## 2023-05-11 ENCOUNTER — Telehealth: Payer: Self-pay | Admitting: Internal Medicine

## 2023-05-11 DIAGNOSIS — E1159 Type 2 diabetes mellitus with other circulatory complications: Secondary | ICD-10-CM

## 2023-05-11 DIAGNOSIS — Z86010 Personal history of colonic polyps: Secondary | ICD-10-CM

## 2023-05-11 DIAGNOSIS — G8929 Other chronic pain: Secondary | ICD-10-CM

## 2023-05-11 MED ORDER — AMLODIPINE BESYLATE 5 MG PO TABS
5.0000 mg | ORAL_TABLET | Freq: Every day | ORAL | 3 refills | Status: DC
Start: 2023-05-11 — End: 2023-08-05

## 2023-05-11 NOTE — Telephone Encounter (Signed)
   Call to patient to f/u on recent issues and referrals.  Tooth pain continues;  no appts available.  Eating a bit better, drinking adequate fluids - urine clear to pale yellow.  Ortho appt - conservative measures discussed, they referred her to a pain clinic; no appt yet scheduled.  I informed her I'd be happy to manage an opioid regimen to simplify already fragmented care. R hip pain wasn't addressed.  Xray no abnormal findings.  This is her greatest concern.  ENT - dxed GERD, advised PPI and lifestyle interventions.  No problems currently. She was relieved nothing abnormal with throat.  Due for colonoscopy polyp f/u - order placed ([place]).  AKI recently due to dehydration from poor oral intake due to mouth pain.  BP was low at that time.  Before lab resulted, I had asked her to stop her amlodipine and continue lisinopril.  I asked her today to switch - take amlodipine and stop the lisinopril.  Her daughter Aggie Cosier wrote this down and ensured the bottles were identified.  DM - fasting cbgs in 100s.  "Doing better"  RTC 06/2023.  I"ll message front desk.   *xray hip reviewed again - unfortunately it was ordered incorrectly for the left rather than the right, though reason for study was given as R hip pain and instability.  R hip xray ordered.

## 2023-05-13 ENCOUNTER — Other Ambulatory Visit: Payer: Self-pay | Admitting: Internal Medicine

## 2023-05-13 DIAGNOSIS — E1159 Type 2 diabetes mellitus with other circulatory complications: Secondary | ICD-10-CM

## 2023-06-02 DIAGNOSIS — M5412 Radiculopathy, cervical region: Secondary | ICD-10-CM | POA: Diagnosis not present

## 2023-06-16 ENCOUNTER — Encounter: Payer: Self-pay | Admitting: Internal Medicine

## 2023-06-24 ENCOUNTER — Ambulatory Visit (INDEPENDENT_AMBULATORY_CARE_PROVIDER_SITE_OTHER): Payer: 59 | Admitting: Internal Medicine

## 2023-06-24 VITALS — BP 124/59 | HR 76 | Wt 207.4 lb

## 2023-06-24 DIAGNOSIS — Z23 Encounter for immunization: Secondary | ICD-10-CM

## 2023-06-24 DIAGNOSIS — I152 Hypertension secondary to endocrine disorders: Secondary | ICD-10-CM | POA: Diagnosis not present

## 2023-06-24 DIAGNOSIS — Z860100 Personal history of colon polyps, unspecified: Secondary | ICD-10-CM

## 2023-06-24 DIAGNOSIS — N183 Chronic kidney disease, stage 3 unspecified: Secondary | ICD-10-CM | POA: Diagnosis not present

## 2023-06-24 DIAGNOSIS — M25551 Pain in right hip: Secondary | ICD-10-CM | POA: Diagnosis not present

## 2023-06-24 DIAGNOSIS — W19XXXA Unspecified fall, initial encounter: Secondary | ICD-10-CM

## 2023-06-24 DIAGNOSIS — Z7985 Long-term (current) use of injectable non-insulin antidiabetic drugs: Secondary | ICD-10-CM | POA: Diagnosis not present

## 2023-06-24 DIAGNOSIS — E118 Type 2 diabetes mellitus with unspecified complications: Secondary | ICD-10-CM

## 2023-06-24 DIAGNOSIS — K047 Periapical abscess without sinus: Secondary | ICD-10-CM

## 2023-06-24 DIAGNOSIS — G8929 Other chronic pain: Secondary | ICD-10-CM | POA: Diagnosis not present

## 2023-06-24 DIAGNOSIS — E66813 Obesity, class 3: Secondary | ICD-10-CM

## 2023-06-24 DIAGNOSIS — E1159 Type 2 diabetes mellitus with other circulatory complications: Secondary | ICD-10-CM | POA: Diagnosis not present

## 2023-06-24 DIAGNOSIS — E1122 Type 2 diabetes mellitus with diabetic chronic kidney disease: Secondary | ICD-10-CM | POA: Diagnosis not present

## 2023-06-24 DIAGNOSIS — F3340 Major depressive disorder, recurrent, in remission, unspecified: Secondary | ICD-10-CM

## 2023-06-24 DIAGNOSIS — E119 Type 2 diabetes mellitus without complications: Secondary | ICD-10-CM | POA: Diagnosis not present

## 2023-06-24 DIAGNOSIS — M1812 Unilateral primary osteoarthritis of first carpometacarpal joint, left hand: Secondary | ICD-10-CM

## 2023-06-24 LAB — GLUCOSE, CAPILLARY: Glucose-Capillary: 165 mg/dL — ABNORMAL HIGH (ref 70–99)

## 2023-06-24 LAB — POCT GLYCOSYLATED HEMOGLOBIN (HGB A1C): Hemoglobin A1C: 9 % — AB (ref 4.0–5.6)

## 2023-06-24 MED ORDER — DICLOFENAC SODIUM 1 % EX GEL
2.0000 g | Freq: Four times a day (QID) | CUTANEOUS | 1 refills | Status: AC
Start: 2023-06-24 — End: ?

## 2023-06-24 MED ORDER — ACCU-CHEK GUIDE VI STRP
ORAL_STRIP | 10 refills | Status: AC
Start: 2023-06-24 — End: ?

## 2023-06-24 MED ORDER — SEMAGLUTIDE (2 MG/DOSE) 8 MG/3ML ~~LOC~~ SOPN
2.0000 mg | PEN_INJECTOR | SUBCUTANEOUS | 11 refills | Status: DC
Start: 2023-06-24 — End: 2024-07-11

## 2023-06-24 MED ORDER — BD PEN NEEDLE NANO U/F 32G X 4 MM MISC
5 refills | Status: DC
Start: 2023-06-24 — End: 2024-08-29

## 2023-06-24 MED ORDER — LANTUS SOLOSTAR 100 UNIT/ML ~~LOC~~ SOPN
19.0000 [IU] | PEN_INJECTOR | Freq: Every day | SUBCUTANEOUS | 2 refills | Status: DC
Start: 2023-06-24 — End: 2024-07-04

## 2023-06-24 NOTE — Assessment & Plan Note (Addendum)
Tolerating Ozempic 1 mg weekly; last about 5# in 2 months, down about 15 in 7 mo.  She notices clothe3s are fitting more loosely and is pleased.  Increase to 1.5 mg weekly if financially feasible.

## 2023-06-24 NOTE — Assessment & Plan Note (Addendum)
A1c 9.0 (10.0 02/2023).  Taking 16 units rather than 18 (16 was on her box from pharmacy). Continues on 1 mg Ozempic once weekly (tolerated well) and metformin 500 bid.  CBGs reviewed, fasting measurements are elevated.  Will increase Lantus to 19 units at bedtime, and increase Ozempic to 2 mg weekly.

## 2023-06-24 NOTE — Progress Notes (Signed)
77 year old [name] [name] is here for routine quarterly visit for management of CVD risk conditions and to follow-up on her right hip pain, which unfortunately continues.  She sustained a fall since her last visit, fortunately without significant injury, when tripping on a throw rug.  She is very concerned that her right leg will give way, continuing to feel as though her right hip is "out of place".  She is pleased that her daughter will be moving in to help keep her company and to assist with care.  She remains a very positive person despite her chronic pain and mobility disability, which does impact her independence. Greatest concern is mobility retriction.  Details of problems below in problem-based documentation.  Patient Active Problem List   Diagnosis Date Noted   Dental infection 04/13/2023   Intertrigo of genitocrural region due to Candida species 12/08/2022   Pain due to onychomycosis of toenails of both feet 11/16/2022   Callus 11/16/2022   Social isolation 11/09/2022   Nonrheumatic aortic valve insufficiency 05/19/2022   Arthritis of both hands 11/06/2021   Hoarseness 07/23/2021   Laryngopharyngeal reflux 07/23/2021   Skin lump of leg, bilateral 04/17/2021   Action tremor 04/17/2021   Bilateral leg numbness 03/13/2021   Right upper lobe pulmonary nodule, not visible on f/u imaging 03/13/2021   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) 12/11/2016   Diabetic  polyneuropathy associated with type 2 diabetes mellitus (HCC) 12/10/2016   Goals of care, counseling/discussion 01/18/2015   PAD (peripheral artery disease) (HCC) 03/27/2014   Hypertensive heart disease with chronic diastolic congestive heart failure (HCC) 12/21/2013   Healthcare maintenance 04/25/2013   Chronic neck and back pain 01/03/2013   Hx of non anemic vitamin B12 deficiency 12/27/2009   Vitamin D deficiency 12/27/2009   Osteopenia 08/01/2008   Hyperlipidemia associated with type 2 diabetes mellitus (HCC) 11/01/2006   Depression, major, recurrent, in remission (HCC) (on therapy) 08/26/2006   Hypertension associated with diabetes (HCC), overly controlled 08/26/2006   Diabetic retinopathy (HCC) 08/26/2006   DM type 2, goal HbA1c < 8% (HCC) 08/26/2006   Coronary artery disease involving native coronary artery of native heart without angina pectoris 08/26/2006    Current Outpatient Medications:    Semaglutide, 2 MG/DOSE, 8 MG/3ML SOPN, Inject 2 mg into the skin once a week., Disp: 3 mL, Rfl: 11   Accu-Chek FastClix Lancets MISC, USE THREE TIMES DAILY. DIAG CODE E11.51 INSULIN DEPENDENT, Disp: 102 each, Rfl: 11   amLODipine (NORVASC) 5 MG tablet, Take 1 tablet (5 mg total) by mouth daily., Disp: 90 tablet, Rfl: 3   aspirin 81 MG tablet, Take 1 tablet (81 mg total) by mouth daily., Disp: 90 tablet, Rfl: 3   atenolol (TENORMIN) 100 MG tablet, Take 1 tablet (100 mg total) by mouth daily., Disp: 90 tablet, Rfl: 3   Blood Glucose Monitoring Suppl (ACCU-CHEK GUIDE) w/Device KIT, 1 each by Does not apply route 3 (three) times daily., Disp: 1 kit,  Rfl: 0   Cholecalciferol (VITAMIN D3 GUMMIES) 25 MCG (1000 UT) CHEW, Chew 2 tablets (2,000 Units total) by mouth daily., Disp: 180 tablet, Rfl: 3   colestipol (COLESTID) 1 g tablet, Take 1-2 tablets (1-2 g total) by mouth 2 (two) times daily as needed., Disp: 360 tablet, Rfl: 3   diclofenac Sodium (VOLTAREN) 1 % GEL, Apply 2 g topically 4 (four) times  daily. To painful areas of hands., Disp: 200 g, Rfl: 1   glucose blood (ACCU-CHEK GUIDE) test strip, Use to test blood glucose 3 times daily. Dx F57.32KGU to test blood glucose 3 times daily. Dx E11.65, Disp: 100 strip, Rfl: 10   insulin glargine (LANTUS SOLOSTAR) 100 UNIT/ML Solostar Pen, Inject 19 Units into the skin at bedtime., Disp: 15 mL, Rfl: 2   Insulin Pen Needle (BD PEN NEEDLE NANO U/F) 32G X 4 MM MISC, USE TO INJECT VICTOZA ONCE A DAY AND LANTUS ONCE A DAY AS DIRECTED, Disp: 100 each, Rfl: 5   metFORMIN (GLUCOPHAGE-XR) 500 MG 24 hr tablet, Take 1 tablet (500 mg total) by mouth in the morning and at bedtime., Disp: 180 tablet, Rfl: 3   nitroGLYCERIN (NITROSTAT) 0.4 MG SL tablet, PLACE 1 TABLET UNDER THE TONGUE EVERY 5 MINUTES AS NEEDED FOR CHEST PAIN, Disp: 25 tablet, Rfl: 5   nystatin cream (MYCOSTATIN), Apply 1 Application topically 2 (two) times daily., Disp: 30 g, Rfl: 3   pantoprazole (PROTONIX) 40 MG tablet, Take 1 tablet (40 mg total) by mouth daily., Disp: 90 tablet, Rfl: 3   PARoxetine (PAXIL) 40 MG tablet, Take 1 tablet (40 mg total) by mouth every morning., Disp: 90 tablet, Rfl: 3   pregabalin (LYRICA) 150 MG capsule, Take 1 capsule (150 mg total) by mouth every evening. TAKE 1 CAPSULE(150 MG) BY MOUTH DAILY, Disp: 90 capsule, Rfl: 3   rosuvastatin (CRESTOR) 40 MG tablet, Take 1 tablet (40 mg total) by mouth daily., Disp: 90 tablet, Rfl: 3   BP (!) 124/59 (BP Location: Right Arm, Patient Position: Sitting, Cuff Size: Normal)   Pulse 76   Wt 207 lb 6.4 oz (94.1 kg)   BMI 34.51 kg/m   Assessment and plan:  Hypertension associated with diabetes (HCC) 124/59 on meds taken this am; no longer dizzy after stopping amlodipine.  Drinking better. Check renal fxn today.  DM type 2, goal HbA1c < 8% (HCC) A1c 9.0 (10.0 02/2023).  Taking 16 units rather than 18 (16 was on her box from pharmacy). Continues on 1 mg Ozempic once weekly (tolerated well) and metformin 500 bid.  CBGs  reviewed, fasting measurements are elevated.  Will increase Lantus to 19 units at bedtime, and increase Ozempic to 2 mg weekly.  Class 3 obesity (HCC) Tolerating Ozempic 1 mg weekly; last about 5# in 2 months, down about 15 in 7 mo.  She notices clothe3s are fitting more loosely and is pleased.  Increase to 1.5 mg weekly if financially feasible.  Chronic right hip pain Continues to feel pain in right lateral hip and right buttock.  On examination she has no tenderness over the SI joints.  Interestingly there is asymmetry-her right buttock is larger and firmer than the left.  She is tender to palpation everywhere from the right mid buttock laterally to over the trochanter area.  No increased heat and no difference in skin color.  She is able to bear weight on the right side.  Able to stand on toes and on heels.  Seem to have more discomfort with  right hip extension and lateral flexion.  Pain does not radiate to the groin.  Given abnormal soft tissues, we will proceed with MRI.  Pelvis was intact on left hip imaging earlier in the summer.  I do not know if she may have sustained a right buttock hematoma from a fall at some point; this has been going on now for some time.  She is at high fall risk and her daughter has moved in with her to help provide assistance.  Fall at home Tripped on a throw rug and fell forward sustaining contusions to face arms and legs for which she did not seek medical attention.  Daughter has moved in to help provide support.  Personal history of colonic polyps Patient has received communication from the GI office that she needs to schedule her colonoscopy.  Depression, major, recurrent, in remission (HCC) (on therapy) Doing well on her Paxil 40 mg daily. She endorses no signs or symptoms of depression and is feeling positive and content. No change in therapy.   Dental infection She has continued to suffer with her right molar but is fortunate that she is able to eat soft  foods.  Appointment for extraction in 07/2023

## 2023-06-24 NOTE — Patient Instructions (Addendum)
[name] [name],  I'm sorry you are still having so much trouble with the right hip.  Will get to the bottom of it.  You will be contacted to schedule an MRI to look more closely at the soft tissues as well as the joint.  I am happy that your daughter's been able to move in with you in the meantime.  Today we discussed making 2 medicine changes for your diabetes: Increasing the Ozempic to 2mg  weekly (we may have to do a prior authorization), and increasing your Lantus from 16 units to 19 units every night.  Will get together again in 3 months for your A1c.  Blood pressure looks great on your current regimen which we will continue.  I will call you with the results of today's blood and urine tests.  Take care and stay well-please, no more falls!  Dr. Mayford Knife

## 2023-06-24 NOTE — Assessment & Plan Note (Signed)
124/59 on meds taken this am; no longer dizzy after stopping amlodipine.  Drinking better. Check renal fxn today.

## 2023-06-25 LAB — BMP8+ANION GAP
Anion Gap: 17 mmol/L (ref 10.0–18.0)
BUN/Creatinine Ratio: 17 (ref 12–28)
BUN: 19 mg/dL (ref 8–27)
CO2: 22 mmol/L (ref 20–29)
Calcium: 9.4 mg/dL (ref 8.7–10.3)
Chloride: 100 mmol/L (ref 96–106)
Creatinine, Ser: 1.12 mg/dL — ABNORMAL HIGH (ref 0.57–1.00)
Glucose: 164 mg/dL — ABNORMAL HIGH (ref 70–99)
Potassium: 4.7 mmol/L (ref 3.5–5.2)
Sodium: 139 mmol/L (ref 134–144)
eGFR: 50 mL/min/{1.73_m2} — ABNORMAL LOW (ref >59)

## 2023-06-25 LAB — MICROALBUMIN / CREATININE URINE RATIO
Creatinine, Urine: 174.6 mg/dL
Microalb/Creat Ratio: 8 mg/g{creat} (ref 0–29)
Microalbumin, Urine: 14.1 ug/mL

## 2023-06-28 DIAGNOSIS — G8929 Other chronic pain: Secondary | ICD-10-CM | POA: Insufficient documentation

## 2023-06-28 NOTE — Assessment & Plan Note (Signed)
Patient has received communication from the GI office that she needs to schedule her colonoscopy.

## 2023-06-28 NOTE — Assessment & Plan Note (Signed)
Continues to feel pain in right lateral hip and right buttock.  On examination she has no tenderness over the SI joints.  Interestingly there is asymmetry-her right buttock is larger and firmer than the left.  She is tender to palpation everywhere from the right mid buttock laterally to over the trochanter area.  No increased heat and no difference in skin color.  She is able to bear weight on the right side.  Able to stand on toes and on heels.  Seem to have more discomfort with right hip extension and lateral flexion.  Pain does not radiate to the groin.  Given abnormal soft tissues, we will proceed with MRI.  Pelvis was intact on left hip imaging earlier in the summer.  I do not know if she may have sustained a right buttock hematoma from a fall at some point; this has been going on now for some time.  She is at high fall risk and her daughter has moved in with her to help provide assistance.

## 2023-06-28 NOTE — Assessment & Plan Note (Signed)
Tripped on a throw rug and fell forward sustaining contusions to face arms and legs for which she did not seek medical attention.  Daughter has moved in to help provide support.

## 2023-06-28 NOTE — Assessment & Plan Note (Signed)
Doing well on her Paxil 40 mg daily. She endorses no signs or symptoms of depression and is feeling positive and content. No change in therapy.

## 2023-06-28 NOTE — Assessment & Plan Note (Signed)
She has continued to suffer with her right molar but is fortunate that she is able to eat soft foods.  Appointment for extraction in 07/2023

## 2023-07-01 ENCOUNTER — Other Ambulatory Visit: Payer: Self-pay | Admitting: Internal Medicine

## 2023-07-01 DIAGNOSIS — Z0000 Encounter for general adult medical examination without abnormal findings: Secondary | ICD-10-CM

## 2023-07-05 ENCOUNTER — Other Ambulatory Visit: Payer: Self-pay

## 2023-07-05 DIAGNOSIS — B372 Candidiasis of skin and nail: Secondary | ICD-10-CM

## 2023-07-05 DIAGNOSIS — F325 Major depressive disorder, single episode, in full remission: Secondary | ICD-10-CM

## 2023-07-06 MED ORDER — NYSTATIN 100000 UNIT/GM EX CREA: 1.0000 | TOPICAL_CREAM | Freq: Two times a day (BID) | CUTANEOUS | 3 refills | Status: AC

## 2023-07-06 MED ORDER — PAROXETINE HCL 40 MG PO TABS
40.0000 mg | ORAL_TABLET | Freq: Every morning | ORAL | 3 refills | Status: DC
Start: 2023-07-06 — End: 2024-07-10

## 2023-07-12 ENCOUNTER — Ambulatory Visit (HOSPITAL_COMMUNITY)
Admission: RE | Admit: 2023-07-12 | Discharge: 2023-07-12 | Disposition: A | Discharge status: Home / Self Care | Payer: 59 | Source: Ambulatory Visit | Attending: Internal Medicine | Admitting: Internal Medicine

## 2023-07-12 DIAGNOSIS — M1611 Unilateral primary osteoarthritis, right hip: Secondary | ICD-10-CM | POA: Diagnosis not present

## 2023-07-12 DIAGNOSIS — M25551 Pain in right hip: Secondary | ICD-10-CM | POA: Diagnosis not present

## 2023-07-12 DIAGNOSIS — G8929 Other chronic pain: Secondary | ICD-10-CM | POA: Diagnosis not present

## 2023-07-12 DIAGNOSIS — S76311A Strain of muscle, fascia and tendon of the posterior muscle group at thigh level, right thigh, initial encounter: Secondary | ICD-10-CM | POA: Diagnosis not present

## 2023-07-19 ENCOUNTER — Telehealth: Payer: Self-pay | Admitting: Internal Medicine

## 2023-07-19 NOTE — Telephone Encounter (Signed)
Could not leave vm (not set up) to let [name] [name] know that her MRI of the hip has not yet been resulted by the radiologist.  I wanted her to know that I'd call as soon as results were available.

## 2023-07-28 ENCOUNTER — Ambulatory Visit
Admission: RE | Admit: 2023-07-28 | Discharge: 2023-07-28 | Disposition: A | Discharge status: Home / Self Care | Payer: 59 | Source: Ambulatory Visit | Attending: Internal Medicine | Admitting: Internal Medicine

## 2023-07-28 DIAGNOSIS — Z1231 Encounter for screening mammogram for malignant neoplasm of breast: Secondary | ICD-10-CM | POA: Diagnosis not present

## 2023-07-28 DIAGNOSIS — Z0000 Encounter for general adult medical examination without abnormal findings: Secondary | ICD-10-CM

## 2023-08-04 ENCOUNTER — Telehealth: Payer: Self-pay | Admitting: Orthopedic Surgery

## 2023-08-04 DIAGNOSIS — M1611 Unilateral primary osteoarthritis, right hip: Secondary | ICD-10-CM

## 2023-08-04 NOTE — Telephone Encounter (Signed)
Patient having significant right hip pain and feeling like leg giving out. MRI showed evidence of arthritis. Recommend diagnostic/therapeutic hip injection.   London Sheer, MD Orthopedic Surgeon

## 2023-08-05 ENCOUNTER — Other Ambulatory Visit: Payer: Self-pay

## 2023-08-05 DIAGNOSIS — I152 Hypertension secondary to endocrine disorders: Secondary | ICD-10-CM

## 2023-08-05 MED ORDER — AMLODIPINE BESYLATE 5 MG PO TABS: 5.0000 mg | ORAL_TABLET | Freq: Every day | ORAL | 3 refills | Status: DC

## 2023-08-10 ENCOUNTER — Other Ambulatory Visit: Payer: Self-pay

## 2023-08-10 MED ORDER — METFORMIN HCL ER 500 MG PO TB24: 500.0000 mg | ORAL_TABLET | Freq: Two times a day (BID) | ORAL | 3 refills | Status: DC

## 2023-08-25 ENCOUNTER — Ambulatory Visit (INDEPENDENT_AMBULATORY_CARE_PROVIDER_SITE_OTHER): Payer: 59 | Admitting: Podiatry

## 2023-08-25 ENCOUNTER — Encounter: Payer: Self-pay | Admitting: Podiatry

## 2023-08-25 DIAGNOSIS — B351 Tinea unguium: Secondary | ICD-10-CM

## 2023-08-25 DIAGNOSIS — M79675 Pain in left toe(s): Secondary | ICD-10-CM

## 2023-08-25 DIAGNOSIS — E1142 Type 2 diabetes mellitus with diabetic polyneuropathy: Secondary | ICD-10-CM | POA: Diagnosis not present

## 2023-08-25 DIAGNOSIS — M79674 Pain in right toe(s): Secondary | ICD-10-CM | POA: Diagnosis not present

## 2023-08-25 DIAGNOSIS — L84 Corns and callosities: Secondary | ICD-10-CM

## 2023-08-29 NOTE — Progress Notes (Signed)
  Subjective:  Patient ID: [name] [name], female    DOB: [birth year],  MRN: [id number]  [name] [name] presents to clinic today for at risk foot care with history of diabetic neuropathy and callus(es) left foot and painful thick toenails that are difficult to trim. Painful toenails interfere with ambulation. Aggravating factors include wearing enclosed shoe gear. Pain is relieved with periodic professional debridement. Painful calluses are aggravated when weightbearing with and without shoegear. Pain is relieved with periodic professional debridement.  Chief Complaint  Patient presents with   [facility]    [facility] BS 145 AM A1C 9.0 06/24/2023 Patient has no concerns at this time.   New problem(s): None.   PCP is Miguel Aschoff, MD.  No Known Allergies  Review of Systems: Negative except as noted in the HPI.  Objective: No changes noted in today's physical examination. There were no vitals filed for this visit. [name] [name] [name] [name] is a pleasant 77 y.o. female in NAD. AAO x 3.  Vascular Examination: Capillary refill time <3 seconds b/l LE. Palpable DP pulses b/l LE; nonpalpable PT pulses b/l. Digital hair sparse b/l. No pedal edema b/l. Skin temperature gradient WNL b/l. No varicosities b/l. No cyanosis or clubbing noted b/l LE.[name]  Dermatological Examination: Pedal skin with normal turgor, texture and tone b/l. No open wounds. No interdigital macerations b/l. Toenails 1-5 b/l thickened, discolored, dystrophic with subungual debris. There is pain on palpation to dorsal aspect of nailplates. Hyperkeratotic lesion(s) submet head 1 left foot.  No erythema, no edema, no drainage, no fluctuance.  Neurological Examination: Pt has subjective symptoms of neuropathy. Protective sensation diminished with 10g monofilament b/l. Proprioception intact bilaterally.  Musculoskeletal Examination: Normal muscle strength 5/5 to all lower extremity muscle groups bilaterally. No pain, crepitus or joint limitation  noted with ROM b/l LE. 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 Pes planus deformity noted bilateral LE.[name] Patient ambulates with walking stick.  Assessment/Plan: 1. Pain due to onychomycosis of toenails of both feet   2. Callus   3. Diabetic peripheral neuropathy associated with type 2 diabetes mellitus (HCC)     -Patient was evaluated today. All questions/concerns addressed on today's visit. -Continue foot and shoe inspections daily. Monitor blood glucose per PCP/Endocrinologist's recommendations. -Patient to continue soft, supportive shoe gear daily. -Toenails 1-5 b/l were debrided in length and girth with sterile nail nippers and dremel without iatrogenic bleeding.  -Callus(es) left foot pared utilizing sterile scalpel blade without complication or incident. Total number debrided =1. -Patient/POA to call should there be question/concern in the interim.   Return in about 3 months (around 11/23/2023).  [name], DPM      [place] LOCATION: [address], [place] [address]                   Office [phone]   [facility] LOCATION: [address] [place], [place] [address] Office [phone]

## 2023-09-20 DIAGNOSIS — N183 Chronic kidney disease, stage 3 unspecified: Secondary | ICD-10-CM | POA: Diagnosis not present

## 2023-09-20 DIAGNOSIS — I5030 Unspecified diastolic (congestive) heart failure: Secondary | ICD-10-CM | POA: Diagnosis not present

## 2023-09-20 DIAGNOSIS — E1122 Type 2 diabetes mellitus with diabetic chronic kidney disease: Secondary | ICD-10-CM | POA: Diagnosis not present

## 2023-09-20 DIAGNOSIS — E785 Hyperlipidemia, unspecified: Secondary | ICD-10-CM | POA: Diagnosis not present

## 2023-09-20 DIAGNOSIS — I129 Hypertensive chronic kidney disease with stage 1 through stage 4 chronic kidney disease, or unspecified chronic kidney disease: Secondary | ICD-10-CM | POA: Diagnosis not present

## 2023-09-27 ENCOUNTER — Ambulatory Visit (INDEPENDENT_AMBULATORY_CARE_PROVIDER_SITE_OTHER): Payer: 59 | Admitting: Physical Medicine and Rehabilitation

## 2023-09-27 ENCOUNTER — Other Ambulatory Visit: Payer: Self-pay

## 2023-09-27 DIAGNOSIS — M25551 Pain in right hip: Secondary | ICD-10-CM | POA: Diagnosis not present

## 2023-09-27 MED ORDER — BUPIVACAINE HCL 0.25 % IJ SOLN
4.0000 mL | INTRAMUSCULAR | Status: AC | PRN
  Administered 2023-09-27: 4 mL via INTRA_ARTICULAR

## 2023-09-27 MED ORDER — TRIAMCINOLONE ACETONIDE 40 MG/ML IJ SUSP
40.0000 mg | INTRAMUSCULAR | Status: AC | PRN
  Administered 2023-09-27: 40 mg via INTRA_ARTICULAR

## 2023-09-27 NOTE — Progress Notes (Signed)
[name] [name] - 77 y.o. female MRN [id number]  Date of birth: 1946-03-23  Office Visit Note: Visit Date: 09/27/2023 PCP: Miguel Aschoff, MD Referred by: London Sheer, MD  Subjective: Chief Complaint  Patient presents with   Right Hip - Pain   HPI:  [name] [name] is a 77 y.o. female who comes in today at the request of Dr. Willia Craze for planned Right anesthetic hip arthrogram with fluoroscopic guidance.  The patient has failed conservative care including home exercise, medications, time and activity modification.  This injection will be diagnostic and hopefully therapeutic.  Please see requesting physician notes for further details and justification.    ROS Otherwise per HPI.  Assessment & Plan: Visit Diagnoses:    ICD-10-CM   1. Pain in right hip  M25.551 Large Joint Inj: R hip joint    XR C-ARM NO REPORT      Plan: No additional findings.   Meds & Orders: No orders of the defined types were placed in this encounter.   Orders Placed This Encounter  Procedures   Large Joint Inj: R hip joint   XR C-ARM NO REPORT    Follow-up: Return for visit to requesting provider as needed.   Procedures: Large Joint Inj: R hip joint on 09/27/2023 1:03 PM Indications: diagnostic evaluation and pain Details: 22 G 3.5 in needle, fluoroscopy-guided anterior approach  Arthrogram: No  Medications: 4 mL bupivacaine 0.25 %; 40 mg triamcinolone acetonide 40 MG/ML Outcome: tolerated well, no immediate complications  There was excellent flow of contrast producing a partial arthrogram of the hip. The patient did have SOME relief of symptoms during the anesthetic phase of the injection. Procedure, treatment alternatives, risks and benefits explained, specific risks discussed. Consent was given by the patient. Immediately prior to procedure a time out was called to verify the correct patient, procedure, equipment, support staff and site/side marked as required. Patient was  prepped and draped in the usual sterile fashion.          Clinical History: MR OF THE RIGHT HIP WITHOUT CONTRAST   TECHNIQUE: Multiplanar, multisequence MR imaging was performed. No intravenous contrast was administered.   COMPARISON:  None Available.   FINDINGS: Bones:   No hip fracture, dislocation or avascular necrosis.   No periosteal reaction or bone destruction. No aggressive osseous lesion.   Mild osteoarthritis of bilateral SI joints. No SI joint widening or erosive changes.   Lumbar fusion partially visualized at L3-4 and L4-5. Disc desiccation at L5-S1.   Articular cartilage and labrum   Articular cartilage: Partial-thickness cartilage loss of the right femoral head and acetabulum.   Labrum: Limited evaluation labrum secondary to patient motion. Probable right labral degeneration.   Joint or bursal effusion   Joint effusion:  No hip joint effusion.  No SI joint effusion.   Bursae:  No bursal fluid.   Muscles and tendons   Flexors: Normal.   Extensors: Normal.   Abductors: Normal.   Adductors: Normal.   Gluteals: Normal.   Hamstrings: Moderate tendinosis of the hamstring origin bilaterally with a small partial-thickness tear.   Other findings   No pelvic free fluid. No fluid collection or hematoma. No inguinal lymphadenopathy. No inguinal hernia.   IMPRESSION: 1. No hip fracture, dislocation or avascular necrosis. 2. Mild osteoarthritis of the right hip. 3. Mild osteoarthritis of bilateral SI joints. 4. Moderate tendinosis of the hamstring origin bilaterally with a small partial-thickness tear.     Electronically Signed  By: Elige Ko M.D.   On: 08/02/2023 16:45     Objective:  VS:  HT:    WT:   BMI:     BP:   HR: bpm  TEMP: ( )  RESP:  Physical Exam   Imaging: XR C-ARM NO REPORT Result Date: 09/27/2023 Please see Notes tab for imaging impression.

## 2023-09-27 NOTE — Progress Notes (Signed)
125/75 Night the pain is worse in her Hip

## 2023-10-19 DIAGNOSIS — H10413 Chronic giant papillary conjunctivitis, bilateral: Secondary | ICD-10-CM | POA: Diagnosis not present

## 2023-10-19 DIAGNOSIS — H40053 Ocular hypertension, bilateral: Secondary | ICD-10-CM | POA: Diagnosis not present

## 2023-10-19 DIAGNOSIS — H4321 Crystalline deposits in vitreous body, right eye: Secondary | ICD-10-CM | POA: Diagnosis not present

## 2023-10-19 DIAGNOSIS — E113293 Type 2 diabetes mellitus with mild nonproliferative diabetic retinopathy without macular edema, bilateral: Secondary | ICD-10-CM | POA: Diagnosis not present

## 2023-10-19 DIAGNOSIS — H04123 Dry eye syndrome of bilateral lacrimal glands: Secondary | ICD-10-CM | POA: Diagnosis not present

## 2023-10-19 LAB — HM DIABETES EYE EXAM

## 2023-10-29 ENCOUNTER — Ambulatory Visit (INDEPENDENT_AMBULATORY_CARE_PROVIDER_SITE_OTHER): Payer: 59 | Admitting: Gastroenterology

## 2023-10-29 ENCOUNTER — Encounter: Payer: Self-pay | Admitting: Gastroenterology

## 2023-10-29 VITALS — BP 122/64 | HR 88 | Ht 62.0 in | Wt 219.0 lb

## 2023-10-29 DIAGNOSIS — Z860100 Personal history of colon polyps, unspecified: Secondary | ICD-10-CM

## 2023-10-29 DIAGNOSIS — K529 Noninfective gastroenteritis and colitis, unspecified: Secondary | ICD-10-CM

## 2023-10-29 MED ORDER — SUFLAVE 178.7 G PO SOLR: 1.0000 | Freq: Once | ORAL | 0 refills | Status: AC

## 2023-10-29 NOTE — Patient Instructions (Addendum)
You have been scheduled for a colonoscopy. Please follow written instructions given to you at your visit today.   Please pick up your prep supplies at the pharmacy within the next 1-3 days.  If you use inhalers (even only as needed), please bring them with you on the day of your procedure.  DO NOT TAKE 7 DAYS PRIOR TO TEST- Trulicity (dulaglutide) Ozempic, Wegovy (semaglutide) Mounjaro (tirzepatide) Bydureon Bcise (exanatide extended release)  DO NOT TAKE 1 DAY PRIOR TO YOUR TEST Rybelsus (semaglutide) Adlyxin (lixisenatide) Victoza (liraglutide) Byetta (exanatide) ___________________________________________________________________________   Stop Taking Colestid 1 week prior to the procedure.  Start Miralax every day for 1 week prior to the procedure.  You can resume Miralax as needed after the procedure.  Thank you for entrusting me with your care and for choosing [facility], Dr. Ileene Patrick

## 2023-10-29 NOTE — Progress Notes (Signed)
HPI :  78 year old female accompanied by her daughter today, history of colon polyps, chronic diarrhea, diabetes, and obesity, here to reestablish her GI care for history of colon polyps and bowel symptoms.  She was last seen by our office in November 2021.  At that point in time she had been complaining of persistent loose stools for some time.  Interestingly before that over a year she had been complaining of chronic constipation.  Her colonoscopy in May 2020 was aborted due to poor prep and we had to repeat the exam in June 2020 with double prep.  She had 3 precancerous polyps removed at that time.  At the time of her last visit I had recommended empiric Colestid plus Imodium as needed.  She states she has been on Colestid 1 g daily since have last seen her and this is worked really well to control bowel movements.  She has 2 formed stools per day.  No blood in her stools.  No urgency.  She is not using any Imodium.  She is very happy with the regimen.  No cardiopulmonary symptoms that bother her.  She has not had any hospitalizations since have seen her.  Takes a baby aspirin daily.  She is also on Ozempic and states she tolerates it well.  We discussed if she want to pursue with additional colonoscopy surveillance exams at this time  Prior exams: Colonoscopy 04/11/19 - double prep - The perianal and digital rectal examinations were normal. - A diminutive polyp was found in the cecum. The polyp was sessile. The polyp was removed with a cold biopsy forceps. Resection and retrieval were complete. - Two flat polyps were found in the ascending colon. The polyps were 4 to 5 mm in size. These polyps were removed with a cold snare. Resection and retrieval were complete. - Scattered medium-mouthed diverticula were found in the transverse colon and left colon. - Internal hemorrhoids were found during retroflexion. - The exam was otherwise without abnormality.   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 The study is low risk.   Left ventricular function is normal. Nuclear stress EF: 73 %. The left ventricular ejection fraction is hyperdynamic (>65%). End diastolic cavity size is normal.   Prior study available for comparison from 12/12/2019.     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 Dione Booze annual f/u, most recent 09/2019   CAD, non-obstructive 08/26/2006   Cath 07/05:multiple areas of nonobstructive disease = medical & RF mgmt, well preserved global systolic function. Dr. Riley Kill. Nuclear med stress test 01/2014 : Normal stress nuclear study. LV Ejection Fraction: 76%. LV Wall Motion: NL LV Function; NL Wall Motion.       Chronic neck and back pain 01/03/2013   Multifactorial. Non opioid requiring.   L2-5 fusion, with hardware at L2-3, stable per MRI 2010. Followed by neurosurgery (Dr. 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 Dione Booze   DYSLIPIDEMIA 11/01/2006   Gallstones    GERD 08/26/2006   Heartburn QHS.      Hx of blood transfusion without reported diagnosis    Hx of esophageal dysphagia requiring dilatation. 03/03/2018   hx of esophageal stenosis s/p dilatation    Hx of small intestinal bacterial overgrowth  12/24/2015   Presumed / clinical dx 2016. Complete resolution with metronidazole (insuance would not cover rifaximin) Flare 2017. Did not respond to ABX. Referral to GI B12 and ferritin low supporting malabsorption.    HYPERTENSION 08/26/2006   Hypertensive heart disease with chronic diastolic congestive heart failure (HCC) 05/01/2020   Morbid obesity with BMI of 50.0-59.9, adult (HCC)    Personal history of colonic polyps 2020   Adenomatous; f/u 2023 [facility]   PVD (peripheral vascular disease) (HCC), resolved per f/u ABIs 2016 03/27/2014   2015 LE arterial Duplex - Possible mild inflow disease on the left. 0-49% bilateral SFA disease, without focal stenosis. Three vesel run-off, bilaterally. Medical tx.    S/P bilateral cataract extraction    Type 2 diabetes mellitus with complications (HCC) 08/26/2006     Past Surgical History:  Procedure Laterality Date   CATARACT EXTRACTION, BILATERAL     CHOLECYSTECTOMY     pt denies 04/07/16   COLONOSCOPY     endometrial biospy     ESOPHAGEAL DILATION     LEFT HEART CATH     lumbar fusion surgery  10/08   L3-L5   POLYPECTOMY     posterior lumbar interfusion surgery  07/18/07   L2-L3   rotator cuff surgery Bilateral    Family History  Problem Relation Age of Onset   Cirrhosis Mother    Diabetes Father    Hypertension Father    Hypertension Sister    Diabetes Sister    Hyperlipidemia Sister    Colon cancer Neg Hx    Throat cancer Neg Hx    Esophageal cancer Neg Hx    Rectal cancer Neg Hx    Stomach cancer Neg Hx    Colon polyps Neg Hx    Social History   Tobacco Use   Smoking status: Never    Passive exposure: Yes   Smokeless tobacco: Never  Vaping Use   Vaping status: Never Used  Substance Use Topics   Alcohol use: No    Alcohol/week: 0.0 standard drinks of alcohol   Drug use: No   Current Outpatient Medications  Medication Sig Dispense Refill   Accu-Chek FastClix Lancets MISC USE THREE TIMES DAILY. DIAG CODE E11.51  INSULIN DEPENDENT 102 each 11   amLODipine (NORVASC) 5 MG tablet Take 1 tablet (5 mg total) by mouth daily. 90 tablet 3   aspirin 81 MG tablet Take 1 tablet (81 mg total) by mouth daily. 90 tablet 3   atenolol (TENORMIN) 100 MG tablet Take 1 tablet (100 mg total) by mouth daily. 90 tablet 3   Blood Glucose Monitoring Suppl (ACCU-CHEK GUIDE) w/Device KIT 1 each by Does not apply route 3 (three) times daily. 1 kit 0   Cholecalciferol (VITAMIN D3 GUMMIES) 25 MCG (1000 UT) CHEW Chew 2 tablets (2,000 Units total) by mouth daily. 180 tablet 3   colestipol (COLESTID) 1 g tablet Take 1-2 tablets (1-2 g total) by mouth 2 (two) times daily as  needed. 360 tablet 3   diclofenac Sodium (VOLTAREN) 1 % GEL Apply 2 g topically 4 (four) times daily. To painful areas of hands. 200 g 1   glucose blood (ACCU-CHEK GUIDE) test strip Use to test blood glucose 3 times daily. Dx W29.56OZH to test blood glucose 3 times daily. Dx E11.65 100 strip 10   Insulin Pen Needle (BD PEN NEEDLE NANO U/F) 32G X 4 MM MISC USE TO INJECT VICTOZA ONCE A DAY AND LANTUS ONCE A DAY AS DIRECTED 100 each 5   metFORMIN (GLUCOPHAGE-XR) 500 MG 24 hr tablet Take 1 tablet (500 mg total) by mouth in the morning and at bedtime. 180 tablet 3   nitroGLYCERIN (NITROSTAT) 0.4 MG SL tablet PLACE 1 TABLET UNDER THE TONGUE EVERY 5 MINUTES AS NEEDED FOR CHEST PAIN 25 tablet 5   nystatin cream (MYCOSTATIN) Apply 1 Application topically 2 (two) times daily. 30 g 3   pantoprazole (PROTONIX) 40 MG tablet Take 1 tablet (40 mg total) by mouth daily. 90 tablet 3   PARoxetine (PAXIL) 40 MG tablet Take 1 tablet (40 mg total) by mouth every morning. 90 tablet 3   pregabalin (LYRICA) 150 MG capsule Take 1 capsule (150 mg total) by mouth every evening. TAKE 1 CAPSULE(150 MG) BY MOUTH DAILY 90 capsule 3   rosuvastatin (CRESTOR) 40 MG tablet Take 1 tablet (40 mg total) by mouth daily. 90 tablet 3   Semaglutide, 2 MG/DOSE, 8 MG/3ML SOPN Inject 2 mg into the skin once a week.  (Patient taking differently: Inject 2 mg into the skin once a week. (Ozempic)) 3 mL 11   insulin glargine (LANTUS SOLOSTAR) 100 UNIT/ML Solostar Pen Inject 19 Units into the skin at bedtime. (Patient taking differently: Inject 12 Units into the skin at bedtime.) 15 mL 2   No current facility-administered medications for this visit.   No Known Allergies   Review of Systems: All systems reviewed and negative except where noted in HPI.   Lab Results  Component Value Date   WBC 9.3 11/06/2021   HGB 12.1 11/06/2021   HCT 35.9 11/06/2021   MCV 88 11/06/2021   PLT 309 11/06/2021    Lab Results  Component Value Date   NA 139 06/24/2023   CL 100 06/24/2023   K 4.7 06/24/2023   CO2 22 06/24/2023   BUN 19 06/24/2023   CREATININE 1.12 (H) 06/24/2023   EGFR 50 (L) 06/24/2023   CALCIUM 9.4 06/24/2023   PHOS 3.8 03/04/2017   ALBUMIN 4.4 07/02/2022   GLUCOSE 164 (H) 06/24/2023    Lab Results  Component Value Date   ALT 19 03/25/2023   AST 15 07/02/2022   GGT 148 (H) 03/14/2021   ALKPHOS 100 07/02/2022   BILITOT 0.3 07/02/2022     Physical Exam: BP 122/64   Pulse 88   Ht 5\' 2"  (1.575 m)   Wt 219 lb (99.3 kg)   BMI 40.06 kg/m  Constitutional: Pleasant,well-developed, female in no acute distress. HEENT: Normocephalic and atraumatic. Conjunctivae are normal. No scleral icterus. Neck supple.  Cardiovascular: Normal rate, regular rhythm.  Pulmonary/chest: Effort normal and breath sounds normal. No wheezing, rales or rhonchi. Abdominal: Soft, protuberant with pannus noted, nontender. There are no masses palpable.  Extremities: no edema Neurological: Alert and oriented to person place and time. Skin: Skin is warm and dry. No rashes noted. Psychiatric: Normal mood and affect. Behavior is normal.   ASSESSMENT: 78 y.o. female here for assessment of the following  1. History of  colon polyps   2. Chronic diarrhea    3 polyps removed back in 2020, we discussed if she want to  have further surveillance exams or not.  I discussed colonoscopy with her, risks and benefits of the exam and anesthesia.  She has no alarm symptoms, anemia.  Further, she is otherwise been doing well in regards to the rest of her health, no cardiopulmonary symptoms that bother her, recent stress test looked okay.  If she want to do another exam I think we could safely get her through this in regards to her anesthesia risk.  Following discussion of this she wishes to have 1 more exam prior to stopping further surveillance.  Assuming no high risk polyps on her next exam this would likely be her last.  Prior to the exam, given her history of using a double prep on the last exam but since then developing loose stools, we discussed how to best prepare her.  I would recommend she stop colestipol 1 week prior to the procedure.  During this time I recommend MiraLAX once daily and titrate up as needed.  Will give her a standard prep to use and hope this works well to clear her out with other measures taken.  If not however, and she needs something else we will be prepared to give her half MiraLAX prep additionally to help clear out if needed.  She will need to hold Ozempic for 1 week prior to the exam as well.  They agree with the plan as outlined  PLAN: - schedule colonoscopy at the [facility]   - stop colestid 1 week before procedure - start Miralax once daily one week prior to procedure - standard prep. Can take more Miralax - 1/2 Miralax prep added if needed - hold Ozempic one week before procedure  Harlin Rain, MD [facility]

## 2023-11-02 ENCOUNTER — Other Ambulatory Visit: Payer: Self-pay

## 2023-11-02 MED ORDER — ATENOLOL 100 MG PO TABS: 100.0000 mg | ORAL_TABLET | Freq: Every day | ORAL | 3 refills | Status: DC

## 2023-11-04 ENCOUNTER — Encounter: Payer: Self-pay | Admitting: Student

## 2023-11-08 ENCOUNTER — Telehealth: Payer: Self-pay | Admitting: Gastroenterology

## 2023-11-08 NOTE — Telephone Encounter (Signed)
error

## 2023-11-25 ENCOUNTER — Telehealth: Payer: Self-pay | Admitting: Physical Medicine and Rehabilitation

## 2023-11-25 NOTE — Telephone Encounter (Signed)
Pt request another right hip injection. Please call

## 2023-11-26 ENCOUNTER — Other Ambulatory Visit: Payer: Self-pay | Admitting: Physical Medicine and Rehabilitation

## 2023-11-26 DIAGNOSIS — M25551 Pain in right hip: Secondary | ICD-10-CM

## 2023-12-02 ENCOUNTER — Telehealth: Payer: Self-pay | Admitting: *Deleted

## 2023-12-02 NOTE — Telephone Encounter (Signed)
 Patient was identified as falling into the [place] Measure - Diabetes.   Patient was: Appointment scheduled with primary care provider in the next 30 days.

## 2023-12-07 ENCOUNTER — Other Ambulatory Visit: Payer: Self-pay

## 2023-12-07 ENCOUNTER — Ambulatory Visit (INDEPENDENT_AMBULATORY_CARE_PROVIDER_SITE_OTHER): Payer: 59 | Admitting: Physical Medicine and Rehabilitation

## 2023-12-07 VITALS — BP 164/82 | HR 88

## 2023-12-07 DIAGNOSIS — M25551 Pain in right hip: Secondary | ICD-10-CM

## 2023-12-07 MED ORDER — TRIAMCINOLONE ACETONIDE 40 MG/ML IJ SUSP
40.0000 mg | INTRAMUSCULAR | Status: AC | PRN
  Administered 2023-12-07: 40 mg via INTRA_ARTICULAR

## 2023-12-07 MED ORDER — BUPIVACAINE HCL 0.25 % IJ SOLN
4.0000 mL | INTRAMUSCULAR | Status: AC | PRN
  Administered 2023-12-07: 4 mL via INTRA_ARTICULAR

## 2023-12-07 NOTE — Progress Notes (Signed)
 [name] [name] - 78 y.o. female MRN [id number]  Date of birth: [birth year]  Office Visit Note: Visit Date: 12/07/2023 PCP: Miguel Aschoff, MD Referred by: Miguel Aschoff, MD  Subjective: Chief Complaint  Patient presents with   Right Hip - Pain   HPI:  [name] [name] is a 78 y.o. female who comes in today for planned repeat Right anesthetic hip arthrogram with fluoroscopic guidance.  The patient has failed conservative care including home exercise, medications, time and activity modification. Prior injection gave more than 50% relief for several months. This injection will be diagnostic and hopefully therapeutic.  Please see requesting physician notes for further details and justification.  Referring: Dr. Willia Craze   ROS Otherwise per HPI.  Assessment & Plan: Visit Diagnoses:    ICD-10-CM   1. Pain in right hip  M25.551 Large Joint Inj: R hip joint    XR C-ARM NO REPORT      Plan: No additional findings.   Meds & Orders: No orders of the defined types were placed in this encounter.   Orders Placed This Encounter  Procedures   Large Joint Inj: R hip joint   XR C-ARM NO REPORT    Follow-up: Return for Willia Craze, MD prior other hip injections.   Procedures: Large Joint Inj: R hip joint on 12/07/2023 1:57 PM Indications: diagnostic evaluation and pain Details: 22 G 3.5 in needle, fluoroscopy-guided anterior approach  Arthrogram: No  Medications: 4 mL bupivacaine 0.25 %; 40 mg triamcinolone acetonide 40 MG/ML Outcome: tolerated well, no immediate complications  There was excellent flow of contrast producing a partial arthrogram of the hip. The patient did have relief of symptoms during the anesthetic phase of the injection. Procedure, treatment alternatives, risks and benefits explained, specific risks discussed. Consent was given by the patient. Immediately prior to procedure a time out was called to verify the correct patient, procedure, equipment,  support staff and site/side marked as required. Patient was prepped and draped in the usual sterile fashion.          Clinical History: MR OF THE RIGHT HIP WITHOUT CONTRAST   TECHNIQUE: Multiplanar, multisequence MR imaging was performed. No intravenous contrast was administered.   COMPARISON:  None Available.   FINDINGS: Bones:   No hip fracture, dislocation or avascular necrosis.   No periosteal reaction or bone destruction. No aggressive osseous lesion.   Mild osteoarthritis of bilateral SI joints. No SI joint widening or erosive changes.   Lumbar fusion partially visualized at L3-4 and L4-5. Disc desiccation at L5-S1.   Articular cartilage and labrum   Articular cartilage: Partial-thickness cartilage loss of the right femoral head and acetabulum.   Labrum: Limited evaluation labrum secondary to patient motion. Probable right labral degeneration.   Joint or bursal effusion   Joint effusion:  No hip joint effusion.  No SI joint effusion.   Bursae:  No bursal fluid.   Muscles and tendons   Flexors: Normal.   Extensors: Normal.   Abductors: Normal.   Adductors: Normal.   Gluteals: Normal.   Hamstrings: Moderate tendinosis of the hamstring origin bilaterally with a small partial-thickness tear.   Other findings   No pelvic free fluid. No fluid collection or hematoma. No inguinal lymphadenopathy. No inguinal hernia.   IMPRESSION: 1. No hip fracture, dislocation or avascular necrosis. 2. Mild osteoarthritis of the right hip. 3. Mild osteoarthritis of bilateral SI joints. 4. Moderate tendinosis of the hamstring origin bilaterally with a small partial-thickness tear.  Electronically Signed   By: Elige Ko M.D.   On: 08/02/2023 16:45     Objective:  VS:  HT:    WT:   BMI:     BP:(!) 164/82  HR:88bpm  TEMP: ( )  RESP:  Physical Exam   Imaging: No results found.

## 2023-12-07 NOTE — Patient Instructions (Signed)

## 2023-12-07 NOTE — Progress Notes (Signed)
 Pain score----9 No Allergies to Contrast Dye Takes ASA

## 2023-12-14 ENCOUNTER — Encounter: Payer: Self-pay | Admitting: Gastroenterology

## 2023-12-15 ENCOUNTER — Ambulatory Visit: Payer: 59 | Admitting: Physician Assistant

## 2023-12-21 ENCOUNTER — Ambulatory Visit: Payer: 59 | Admitting: Gastroenterology

## 2023-12-21 ENCOUNTER — Encounter: Payer: Self-pay | Admitting: Gastroenterology

## 2023-12-21 VITALS — BP 120/58 | HR 78 | Temp 97.2°F | Resp 14 | Ht 62.0 in | Wt 219.0 lb

## 2023-12-21 DIAGNOSIS — K648 Other hemorrhoids: Secondary | ICD-10-CM | POA: Diagnosis not present

## 2023-12-21 DIAGNOSIS — K635 Polyp of colon: Secondary | ICD-10-CM | POA: Diagnosis not present

## 2023-12-21 DIAGNOSIS — I2510 Atherosclerotic heart disease of native coronary artery without angina pectoris: Secondary | ICD-10-CM | POA: Diagnosis not present

## 2023-12-21 DIAGNOSIS — Z860100 Personal history of colon polyps, unspecified: Secondary | ICD-10-CM

## 2023-12-21 DIAGNOSIS — D122 Benign neoplasm of ascending colon: Secondary | ICD-10-CM

## 2023-12-21 DIAGNOSIS — K5730 Diverticulosis of large intestine without perforation or abscess without bleeding: Secondary | ICD-10-CM

## 2023-12-21 DIAGNOSIS — K529 Noninfective gastroenteritis and colitis, unspecified: Secondary | ICD-10-CM | POA: Diagnosis not present

## 2023-12-21 DIAGNOSIS — D124 Benign neoplasm of descending colon: Secondary | ICD-10-CM

## 2023-12-21 DIAGNOSIS — Z1211 Encounter for screening for malignant neoplasm of colon: Secondary | ICD-10-CM | POA: Diagnosis not present

## 2023-12-21 DIAGNOSIS — D123 Benign neoplasm of transverse colon: Secondary | ICD-10-CM | POA: Diagnosis not present

## 2023-12-21 DIAGNOSIS — D125 Benign neoplasm of sigmoid colon: Secondary | ICD-10-CM

## 2023-12-21 DIAGNOSIS — I10 Essential (primary) hypertension: Secondary | ICD-10-CM | POA: Diagnosis not present

## 2023-12-21 MED ORDER — SODIUM CHLORIDE 0.9 % IV SOLN: 500.0000 mL | INTRAVENOUS | Status: DC

## 2023-12-21 NOTE — Progress Notes (Signed)
 [facility] History and Physical   Primary Care Physician:  Miguel Aschoff, MD   Reason for Procedure:   History of colon polyps, chronic diarrhea  Plan:    colonoscopy     HPI: Eleyna Brugh is a 78 y.o. female  here for colonoscopy surveillance - 3 polyps removed 03/2019, also with chronic diarrhea - managed with colestid.   Otherwise feels well without any cardiopulmonary symptoms. No changes since I last saw her in the office. She feels well today without complaints.  I have discussed risks / benefits of anesthesia and endoscopic procedure with Elmer Bales and they wish to proceed with the exams as outlined today.    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 Dione Booze annual f/u, most recent 09/2019   CAD, non-obstructive 08/26/2006   Cath 07/05:multiple areas of nonobstructive disease = medical & RF mgmt, well preserved global systolic function. Dr. Riley Kill. Nuclear med stress test 01/2014 : Normal stress nuclear study. LV Ejection Fraction: 76%. LV Wall Motion: NL LV Function; NL Wall Motion.       Chronic neck and back pain 01/03/2013   Multifactorial. Non opioid requiring.   L2-5 fusion, with hardware at L2-3, stable per MRI 2010. Followed by neurosurgery (Dr. 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     CKD stage 2 due to type 2 diabetes mellitus (HCC) 12/11/2016   DEPRESSION 08/26/2006   On paxil     Diabetic peripheral neuropathy associated with type 2 diabetes mellitus (HCC)    Diabetic retinopathy associated with controlled type 2 diabetes mellitus (HCC) 09/2019   with  macular edema OS - Dr. Dione Booze   DYSLIPIDEMIA 11/01/2006   Gallstones    GERD 08/26/2006   Heartburn  QHS.      Hx of blood transfusion without reported diagnosis    Hx of esophageal dysphagia requiring dilatation. 03/03/2018   hx of esophageal stenosis s/p dilatation    Hx of small intestinal bacterial overgrowth 12/24/2015   Presumed / clinical dx 2016. Complete resolution with metronidazole (insuance would not cover rifaximin) Flare 2017. Did not respond to ABX. Referral to GI B12 and ferritin low supporting malabsorption.    HYPERTENSION 08/26/2006   Hypertensive heart disease with chronic diastolic congestive heart failure (HCC) 05/01/2020   Morbid obesity with BMI of 50.0-59.9, adult (HCC)    Personal history of colonic polyps 2020   Adenomatous; f/u 2023 [facility]   PVD (peripheral vascular disease) (HCC), resolved per f/u ABIs 2016 03/27/2014   2015 LE arterial Duplex - Possible mild inflow disease on the left. 0-49% bilateral SFA disease, without focal stenosis. Three vesel run-off, bilaterally. Medical tx.    S/P bilateral cataract extraction    Type 2 diabetes mellitus with complications (HCC) 08/26/2006    Past Surgical History:  Procedure Laterality Date   CATARACT EXTRACTION, BILATERAL     CHOLECYSTECTOMY     pt denies 04/07/16   COLONOSCOPY     endometrial biospy     ESOPHAGEAL DILATION     LEFT HEART CATH     lumbar fusion surgery  10/08   L3-L5   POLYPECTOMY     posterior lumbar interfusion surgery  07/18/07  L2-L3   rotator cuff surgery Bilateral     Prior to Admission medications   Medication Sig Start Date End Date Taking? Authorizing Provider  amLODipine (NORVASC) 5 MG tablet Take 1 tablet (5 mg total) by mouth daily. 08/05/23 08/04/24 Yes Miguel Aschoff, MD  aspirin 81 MG tablet Take 1 tablet (81 mg total) by mouth daily. 04/25/13  Yes Burns Spain, MD  atenolol (TENORMIN) 100 MG tablet Take 1 tablet (100 mg total) by mouth daily. 11/02/23 10/27/24 Yes Miguel Aschoff, MD  Blood Glucose Monitoring Suppl (ACCU-CHEK GUIDE) w/Device KIT 1 each  by Does not apply route 3 (three) times daily. 08/10/17  Yes Burns Spain, MD  Cholecalciferol (VITAMIN D3 GUMMIES) 25 MCG (1000 UT) CHEW Chew 2 tablets (2,000 Units total) by mouth daily. 04/02/22  Yes Miguel Aschoff, MD  colestipol (COLESTID) 1 g tablet Take 1-2 tablets (1-2 g total) by mouth 2 (two) times daily as needed. 07/23/22  Yes Miguel Aschoff, MD  diclofenac Sodium (VOLTAREN) 1 % GEL Apply 2 g topically 4 (four) times daily. To painful areas of hands. 06/24/23  Yes Miguel Aschoff, MD  glucose blood (ACCU-CHEK GUIDE) test strip Use to test blood glucose 3 times daily. Dx Z30.86VHQ to test blood glucose 3 times daily. Dx E11.65 06/24/23  Yes Miguel Aschoff, MD  insulin glargine (LANTUS SOLOSTAR) 100 UNIT/ML Solostar Pen Inject 19 Units into the skin at bedtime. Patient taking differently: Inject 12 Units into the skin at bedtime. 06/24/23 04/19/24 Yes Miguel Aschoff, MD  Insulin Pen Needle (BD PEN NEEDLE NANO U/F) 32G X 4 MM MISC USE TO INJECT VICTOZA ONCE A DAY AND LANTUS ONCE A DAY AS DIRECTED 06/24/23  Yes Miguel Aschoff, MD  metFORMIN (GLUCOPHAGE-XR) 500 MG 24 hr tablet Take 1 tablet (500 mg total) by mouth in the morning and at bedtime. 08/10/23 08/04/24 Yes Miguel Aschoff, MD  PARoxetine (PAXIL) 40 MG tablet Take 1 tablet (40 mg total) by mouth every morning. 07/06/23  Yes Miguel Aschoff, MD  pregabalin (LYRICA) 150 MG capsule Take 1 capsule (150 mg total) by mouth every evening. TAKE 1 CAPSULE(150 MG) BY MOUTH DAILY 10/27/22  Yes Miguel Aschoff, MD  rosuvastatin (CRESTOR) 40 MG tablet Take 1 tablet (40 mg total) by mouth daily. 12/21/22  Yes Chandrasekhar, Rondel Jumbo, MD  Accu-Chek FastClix Lancets MISC USE THREE TIMES DAILY. 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 07/06/23   Miguel Aschoff, MD  pantoprazole (PROTONIX) 40 MG tablet Take 1 tablet (40 mg total) by mouth daily. 02/04/23 01/30/24  Miguel Aschoff, MD  Semaglutide, 2 MG/DOSE, 8 MG/3ML SOPN Inject 2 mg into the skin once a week. Patient taking differently: Inject 2 mg into the skin once a week. (Ozempic) 06/24/23   Miguel Aschoff, MD    Current Outpatient Medications  Medication Sig Dispense Refill   amLODipine (NORVASC) 5 MG tablet Take 1 tablet (5 mg total) by mouth daily. 90 tablet 3   aspirin 81 MG tablet Take 1 tablet (81 mg total) by mouth daily. 90 tablet 3   atenolol (TENORMIN) 100 MG tablet Take 1 tablet (100 mg total) by mouth daily. 90 tablet 3  Blood Glucose Monitoring Suppl (ACCU-CHEK GUIDE) w/Device KIT 1 each by Does not apply route 3 (three) times daily. 1 kit 0   Cholecalciferol (VITAMIN D3 GUMMIES) 25 MCG (1000 UT) CHEW Chew 2 tablets (2,000 Units total) by mouth daily. 180 tablet 3   colestipol (COLESTID) 1 g tablet Take 1-2 tablets (1-2 g total) by mouth 2 (two) times daily as needed. 360 tablet 3   diclofenac Sodium (VOLTAREN) 1 % GEL Apply 2 g topically 4 (four) times daily. To painful areas of hands. 200 g 1   glucose blood (ACCU-CHEK GUIDE) test strip Use to test blood glucose 3 times daily. Dx G95.62ZHY to test blood glucose 3 times daily. Dx E11.65 100 strip 10   insulin glargine (LANTUS SOLOSTAR) 100 UNIT/ML Solostar Pen Inject 19 Units into the skin at bedtime. (Patient taking differently: Inject 12 Units into the skin at bedtime.) 15 mL 2   Insulin Pen Needle (BD PEN NEEDLE NANO U/F) 32G X 4 MM MISC USE TO INJECT VICTOZA ONCE A DAY AND LANTUS ONCE A DAY AS DIRECTED 100 each 5   metFORMIN (GLUCOPHAGE-XR) 500 MG 24 hr tablet Take 1 tablet (500 mg total) by mouth in the morning and at bedtime. 180 tablet 3   PARoxetine (PAXIL) 40 MG tablet Take 1 tablet (40 mg total) by mouth  every morning. 90 tablet 3   pregabalin (LYRICA) 150 MG capsule Take 1 capsule (150 mg total) by mouth every evening. TAKE 1 CAPSULE(150 MG) BY MOUTH DAILY 90 capsule 3   rosuvastatin (CRESTOR) 40 MG tablet Take 1 tablet (40 mg total) by mouth daily. 90 tablet 3   Accu-Chek FastClix Lancets MISC USE THREE TIMES DAILY. 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 30 g 3   pantoprazole (PROTONIX) 40 MG tablet Take 1 tablet (40 mg total) by mouth daily. 90 tablet 3   Semaglutide, 2 MG/DOSE, 8 MG/3ML SOPN Inject 2 mg into the skin once a week. (Patient taking differently: Inject 2 mg into the skin once a week. (Ozempic)) 3 mL 11   Current Facility-Administered Medications  Medication Dose Route Frequency Provider Last Rate Last Admin   0.9 %  sodium chloride infusion  500 mL Intravenous Continuous Gabriellia Rempel, Willaim Rayas, MD        Allergies as of 12/21/2023   (No Known Allergies)    Family History  Problem Relation Age of Onset   Cirrhosis Mother    Diabetes Father    Hypertension Father    Hypertension Sister    Diabetes Sister    Hyperlipidemia Sister    Colon cancer Neg Hx    Throat cancer Neg Hx    Esophageal cancer Neg Hx    Rectal cancer Neg Hx    Stomach cancer Neg Hx    Colon polyps Neg Hx     Social History   Socioeconomic History   Marital status: Single    Spouse name: Not on file   Number of children: 2   Years of education: Not on file   Highest education level: Not on file  Occupational History   Not on file  Tobacco Use   Smoking status: Never    Passive exposure: Yes   Smokeless tobacco: Never  Vaping Use   Vaping status:  Never Used  Substance and Sexual Activity   Alcohol use: No    Alcohol/week: 0.0 standard drinks of alcohol   Drug use: No    Sexual activity: Not Currently  Other Topics Concern   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 Has two daughters and several grand kids. Severe limitation in ambulation. Occ goes out to church but usually daughter gets groceries.    She is one of 7 kids and the oldest so helped raise the other 6.   Social Drivers of Health   Financial Resource Strain: Low Risk  (12/08/2022)   Overall Financial Resource Strain (CARDIA)    Difficulty of Paying Living Expenses: Not hard at all  Food Insecurity: Low Risk  (04/27/2023)   Received from [facility]   Hunger Vital Sign    Worried About Running Out of Food in the Last Year: Never true    Ran Out of Food in the Last Year: Never true  Transportation Needs: Not on file (04/27/2023)  Physical Activity: Inactive (12/08/2022)   Exercise Vital Sign    Days of Exercise per Week: 0 days    Minutes of Exercise per Session: 0 min  Stress: No Stress Concern Present (12/08/2022)   [employer] of Occupational Health - Occupational Stress Questionnaire    Feeling of Stress : Not at all  Social Connections: Moderately Isolated (12/08/2022)   Social Connection and Isolation Panel [NHANES]    Frequency of Communication with Friends and Family: More than three times a week    Frequency of Social Gatherings with Friends and Family: More than three times a week    Attends Religious Services: 1 to 4 times per year    Active Member of [employer] or Organizations: No    Attends Banker Meetings: Never    Marital Status: Never married  Intimate Partner Violence: Not At Risk (12/08/2022)   Humiliation, Afraid, Rape, and Kick questionnaire    Fear of Current or Ex-Partner: No    Emotionally Abused: No    Physically Abused: No    Sexually Abused: No    Review of Systems: All other review of systems negative except as mentioned in the HPI.  Physical Exam: Vital signs BP (!) 151/82   Pulse 88   Temp (!)  97.2 F (36.2 C)   Resp 14   Ht 5\' 2"  (1.575 m)   Wt 219 lb (99.3 kg)   SpO2 97%   BMI 40.06 kg/m   General:   Alert,  Well-developed, pleasant and cooperative in NAD Lungs:  Clear throughout to auscultation.   Heart:  Regular rate and rhythm Abdomen:  Soft, nontender and nondistended.   Neuro/Psych:  Alert and cooperative. Normal mood and affect. A and O x 3  Harlin Rain, MD [facility]

## 2023-12-21 NOTE — Progress Notes (Signed)
 Called to room to assist during endoscopic procedure.  Patient ID and intended procedure confirmed with present staff. Received instructions for my participation in the procedure from the performing physician.

## 2023-12-21 NOTE — Patient Instructions (Signed)
Resume previous diet and medications. Awaiting pathology results. Handouts provided on: Colon polyps, Diverticulosis and Hemorrhoids.  YOU HAD AN ENDOSCOPIC PROCEDURE TODAY AT THE [facility]:   Refer to the procedure report that was given to you for any specific questions about what was found during the examination.  If the procedure report does not answer your questions, please call your gastroenterologist to clarify.  If you requested that your care partner not be given the details of your procedure findings, then the procedure report has been included in a sealed envelope for you to review at your convenience later.  YOU SHOULD EXPECT: Some feelings of bloating in the abdomen. Passage of more gas than usual.  Walking can help get rid of the air that was put into your GI tract during the procedure and reduce the bloating. If you had a lower endoscopy (such as a colonoscopy or flexible sigmoidoscopy) you may notice spotting of blood in your stool or on the toilet paper. If you underwent a bowel prep for your procedure, you may not have a normal bowel movement for a few days.  Please Note:  You might notice some irritation and congestion in your nose or some drainage.  This is from the oxygen used during your procedure.  There is no need for concern and it should clear up in a day or so.  SYMPTOMS TO REPORT IMMEDIATELY:  Following lower endoscopy (colonoscopy or flexible sigmoidoscopy):  Excessive amounts of blood in the stool  Significant tenderness or worsening of abdominal pains  Swelling of the abdomen that is new, acute  Fever of 100F or higher  For urgent or emergent issues, a gastroenterologist can be reached at any hour by calling [phone]. Do not use MyChart messaging for urgent concerns.    DIET:  We do recommend a small meal at first, but then you may proceed to your regular diet.  Drink plenty of fluids but you should avoid alcoholic beverages for 24  hours.  ACTIVITY:  You should plan to take it easy for the rest of today and you should NOT DRIVE or use heavy machinery until tomorrow (because of the sedation medicines used during the test).    FOLLOW UP: Our staff will call the number listed on your records the next business day following your procedure.  We will call around 7:15- 8:00 am to check on you and address any questions or concerns that you may have regarding the information given to you following your procedure. If we do not reach you, we will leave a message.     If any biopsies were taken you will be contacted by phone or by letter within the next 1-3 weeks.  Please call us at [phone] if you have not heard about the biopsies in 3 weeks.    SIGNATURES/CONFIDENTIALITY: You and/or your care partner have signed paperwork which will be entered into your electronic medical record.  These signatures attest to the fact that that the information above on your After Visit Summary has been reviewed and is understood.  Full responsibility of the confidentiality of this discharge information lies with you and/or your care-partner.

## 2023-12-21 NOTE — Progress Notes (Signed)
 To pacu, VSS. Report to [email or web address]

## 2023-12-21 NOTE — Op Note (Signed)
 [facility] Patient Name: [name] [name] Procedure Date: 12/21/2023 12:34 PM MRN: [id number] Endoscopist: Viviann Spare P. Adela Lank , MD, [id number] Age: 78 Referring MD:  Date of Birth: 02-03-1946 Gender: Female Account #: [id number] Procedure:                Colonoscopy Indications:              High risk colon cancer surveillance: Personal                            history of colonic polyps - 3 polyps removed                            03/2019, incidental - chronic loose stool managed                            with colestid Medicines:                Monitored Anesthesia Care Procedure:                Pre-Anesthesia Assessment:                           - Prior to the procedure, a History and Physical                            was performed, and patient medications and                            allergies were reviewed. The patient's tolerance of                            previous anesthesia was also reviewed. The risks                            and benefits of the procedure and the sedation                            options and risks were discussed with the patient.                            All questions were answered, and informed consent                            was obtained. Prior Anticoagulants: The patient has                            taken no anticoagulant or antiplatelet agents. ASA                            Grade Assessment: III - A patient with severe                            systemic disease. After reviewing the risks and  benefits, the patient was deemed in satisfactory                            condition to undergo the procedure.                           After obtaining informed consent, the colonoscope                            was passed under direct vision. Throughout the                            procedure, the patient's blood pressure, pulse, and                            oxygen saturations were monitored  continuously. The                            Olympus CF-HQ190L ([id number]) Colonoscope was                            introduced through the anus and advanced to the the                            cecum, identified by appendiceal orifice and                            ileocecal valve. The colonoscopy was performed                            without difficulty. The patient tolerated the                            procedure well. The quality of the bowel                            preparation was adequate. The ileocecal valve,                            appendiceal orifice, and rectum were photographed. Scope In: 1:35:14 PM Scope Out: 1:58:42 PM Scope Withdrawal Time: 0 hours 16 minutes 17 seconds  Total Procedure Duration: 0 hours 23 minutes 28 seconds  Findings:                 The perianal and digital rectal examinations were                            normal.                           A 5 mm polyp was found in the ascending colon. The                            polyp was flat. The polyp was removed with a cold  snare. Resection and retrieval were complete.                           Two sessile polyps were found in the hepatic                            flexure. The polyps were 2 to 3 mm in size. These                            polyps were removed with a cold snare. Resection                            and retrieval were complete.                           A 3 mm polyp was found in the descending colon. The                            polyp was sessile. The polyp was removed with a                            cold snare. Resection and retrieval were complete.                           A 4 mm polyp was found in the sigmoid colon. The                            polyp was sessile. The polyp was removed with a                            cold snare. Resection and retrieval were complete.                           Multiple small-mouthed diverticula were found in                             the sigmoid colon.                           Internal hemorrhoids were found during retroflexion.                           The exam was otherwise without abnormality.                           Biopsies for histology were taken with a cold                            forceps from the right colon, left colon and                            transverse colon for evaluation of microscopic  colitis. Complications:            No immediate complications. Estimated blood loss:                            Minimal. Estimated Blood Loss:     Estimated blood loss was minimal. Impression:               - One 5 mm polyp in the ascending colon, removed                            with a cold snare. Resected and retrieved.                           - Two 2 to 3 mm polyps at the hepatic flexure,                            removed with a cold snare. Resected and retrieved.                           - One 3 mm polyp in the descending colon, removed                            with a cold snare. Resected and retrieved.                           - One 4 mm polyp in the sigmoid colon, removed with                            a cold snare. Resected and retrieved.                           - Diverticulosis in the sigmoid colon.                           - Internal hemorrhoids.                           - The examination was otherwise normal.                           - Biopsies were taken with a cold forceps from the                            right colon, left colon and transverse colon for                            evaluation of microscopic colitis. Recommendation:           - Patient has a contact number available for                            emergencies. The signs and symptoms of potential  delayed complications were discussed with the                            patient. Return to normal activities tomorrow.                             Written discharge instructions were provided to the                            patient.                           - Resume previous diet.                           - Continue present medications.                           - Await pathology results. Viviann Spare P. Lilian Fuhs, MD 12/21/2023 2:03:53 PM This report has been signed electronically.

## 2023-12-22 ENCOUNTER — Telehealth: Payer: Self-pay

## 2023-12-22 NOTE — Telephone Encounter (Signed)
 Unable to leave message no voice mail set up.

## 2023-12-24 LAB — SURGICAL PATHOLOGY

## 2023-12-25 ENCOUNTER — Encounter: Payer: Self-pay | Admitting: Gastroenterology

## 2023-12-28 ENCOUNTER — Encounter: Payer: Self-pay | Admitting: Internal Medicine

## 2023-12-28 ENCOUNTER — Ambulatory Visit: Payer: 59 | Admitting: Internal Medicine

## 2023-12-28 VITALS — BP 141/67 | HR 81 | Temp 98.0°F | Ht 62.0 in | Wt 201.1 lb

## 2023-12-28 DIAGNOSIS — E1169 Type 2 diabetes mellitus with other specified complication: Secondary | ICD-10-CM | POA: Diagnosis not present

## 2023-12-28 DIAGNOSIS — Z794 Long term (current) use of insulin: Secondary | ICD-10-CM | POA: Diagnosis not present

## 2023-12-28 DIAGNOSIS — E1140 Type 2 diabetes mellitus with diabetic neuropathy, unspecified: Secondary | ICD-10-CM | POA: Diagnosis not present

## 2023-12-28 DIAGNOSIS — K9089 Other intestinal malabsorption: Secondary | ICD-10-CM | POA: Diagnosis not present

## 2023-12-28 DIAGNOSIS — R0989 Other specified symptoms and signs involving the circulatory and respiratory systems: Secondary | ICD-10-CM

## 2023-12-28 DIAGNOSIS — E1159 Type 2 diabetes mellitus with other circulatory complications: Secondary | ICD-10-CM | POA: Diagnosis not present

## 2023-12-28 DIAGNOSIS — M542 Cervicalgia: Secondary | ICD-10-CM | POA: Diagnosis not present

## 2023-12-28 DIAGNOSIS — N183 Chronic kidney disease, stage 3 unspecified: Secondary | ICD-10-CM | POA: Diagnosis not present

## 2023-12-28 DIAGNOSIS — E1142 Type 2 diabetes mellitus with diabetic polyneuropathy: Secondary | ICD-10-CM

## 2023-12-28 DIAGNOSIS — E785 Hyperlipidemia, unspecified: Secondary | ICD-10-CM

## 2023-12-28 DIAGNOSIS — E1122 Type 2 diabetes mellitus with diabetic chronic kidney disease: Secondary | ICD-10-CM

## 2023-12-28 DIAGNOSIS — N1831 Chronic kidney disease, stage 3a: Secondary | ICD-10-CM | POA: Diagnosis not present

## 2023-12-28 DIAGNOSIS — E118 Type 2 diabetes mellitus with unspecified complications: Secondary | ICD-10-CM

## 2023-12-28 DIAGNOSIS — M549 Dorsalgia, unspecified: Secondary | ICD-10-CM

## 2023-12-28 DIAGNOSIS — I152 Hypertension secondary to endocrine disorders: Secondary | ICD-10-CM

## 2023-12-28 DIAGNOSIS — G8929 Other chronic pain: Secondary | ICD-10-CM

## 2023-12-28 LAB — GLUCOSE, CAPILLARY: Glucose-Capillary: 163 mg/dL — ABNORMAL HIGH (ref 70–99)

## 2023-12-28 LAB — POCT GLYCOSYLATED HEMOGLOBIN (HGB A1C): Hemoglobin A1C: 8.2 % — AB (ref 4.0–5.6)

## 2023-12-28 MED ORDER — ATENOLOL 100 MG PO TABS: 100.0000 mg | ORAL_TABLET | Freq: Every day | ORAL | 3 refills | Status: DC

## 2023-12-28 MED ORDER — ROSUVASTATIN CALCIUM 40 MG PO TABS: 40.0000 mg | ORAL_TABLET | Freq: Every day | ORAL | 3 refills | Status: DC

## 2023-12-28 MED ORDER — PANTOPRAZOLE SODIUM 40 MG PO TBEC: 40.0000 mg | DELAYED_RELEASE_TABLET | Freq: Every day | ORAL | 3 refills | Status: AC

## 2023-12-28 MED ORDER — PREGABALIN 150 MG PO CAPS: 150.0000 mg | ORAL_CAPSULE | Freq: Every evening | ORAL | 3 refills | Status: AC

## 2023-12-28 MED ORDER — COLESTIPOL HCL 1 G PO TABS
1.0000 g | ORAL_TABLET | Freq: Two times a day (BID) | ORAL | 3 refills | Status: AC | PRN
Start: 2023-12-28 — End: ?

## 2023-12-28 MED ORDER — AMLODIPINE BESYLATE 5 MG PO TABS: 5.0000 mg | ORAL_TABLET | Freq: Every day | ORAL | 3 refills | Status: AC

## 2023-12-28 NOTE — Assessment & Plan Note (Signed)
 Audible in very localized area lower R neck; asymptomatic.  Given high risk for CVA, will evaluate with carotid US. Continue risk factor management.  DM2 and HTN nearly at goal, cholesterol controlled.

## 2023-12-28 NOTE — Assessment & Plan Note (Addendum)
 Fasting CBGs in low 100s.  Taking 19  units nightly Lantus, Ozempic 2 mg weekly (has lost 20# in 1 year), and metformin 500 mg bid (no higher due to GI intolerance/diarrhea).  A1c today = 8.2, declining nicely over the year.  Anticipated additional benefit with ongoing max dose Ozempic.

## 2023-12-28 NOTE — Patient Instructions (Addendum)
 You're doing great, Ms. Brickman!  Your diabetes is getting under better control slowly but surely, and will continue to do so with the Ozempic.  Keep up the good work by watching your meal sizes.  Checking your kidney function today - I'll let you know if there are any concerns to know about before you see the kidney specialists.  I can hear a sound in your R large neck artery, called the carotid artery.  Sometimes (but not always!) this can be a sign that there is a blockage occurring.  I would like for you to have an ultrasound test to see if there is anything that needs to be done to keep you safe and healthy.  WE talked about how your Lyrica may not be helping your night foot/leg pain.  Try stopping it to see if it's really necessary.  I'll leave it on your list for now.  Let's get together in 3 months!  Give some thought to the therapist for your neck pain - they can help!  Take care and stay well,  Dr. Mayford Knife

## 2023-12-28 NOTE — Progress Notes (Unsigned)
 78 y.o. [name] [name] is here for routine follow-up of chronic HTN, obesity, DM2 with complications, among others.  We are overdue for quarterly appt.  Since last visit patient has seen orthopedist for R hip injections which don't help for long.  Has seen GI Dr. Adela [name] who did screening polyp f/u colonoscopy last week, and no cancer; she has graduated from screening.   Finally had her chronically painful tooth removed.   Today she has concerns about her R neck, stiffness and pain, a recurrence of chronically episodic problem.  On ROS, mentions sometimes that she has continued problems with phlegm collecting in her throat - has seen ENT numerous times. Neither of these are particularly worrisome, but do bother her.  She otherwise has been feeling fairly well.  Enjoying the company and assistance of her daughter who moved in some months ago. No questions about her medications, which she took this morning before visit.  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) 12/11/2016   Diabetic  polyneuropathy associated with type 2 diabetes mellitus (HCC) 12/10/2016   Goals of care, counseling/discussion 01/18/2015   PAD (peripheral artery disease) (HCC) 03/27/2014   Hypertensive heart disease with chronic diastolic congestive heart failure (HCC) 12/21/2013   Healthcare maintenance 04/25/2013   Chronic neck and back pain 01/03/2013   Osteopenia 08/01/2008   Hyperlipidemia associated with type 2 diabetes mellitus (HCC) 11/01/2006   Depression, major, recurrent, in remission (HCC) (on therapy) 08/26/2006   Hypertension associated with diabetes (HCC) 08/26/2006   Diabetic retinopathy (HCC) 08/26/2006   Diabetes mellitus type 2 with complications (HCC) 08/26/2006   Coronary artery disease involving native coronary artery of native heart without angina pectoris 08/26/2006    Current Outpatient Medications:    Accu-Chek FastClix Lancets MISC, USE THREE TIMES DAILY. DIAG CODE E11.51 INSULIN DEPENDENT, Disp: 102 each, Rfl: 11   amLODipine (NORVASC) 5 MG tablet, Take 1 tablet (5 mg total) by mouth daily., Disp: 90 tablet, Rfl: 3   aspirin 81 MG tablet, Take 1 tablet (81 mg total) by mouth daily., Disp: 90 tablet, Rfl: 3   atenolol (TENORMIN) 100 MG tablet, Take 1 tablet (100 mg total) by mouth daily., Disp: 90 tablet, Rfl: 3   Blood Glucose Monitoring Suppl (ACCU-CHEK GUIDE) w/Device KIT, 1 each by Does not apply route 3 (three) times daily., Disp: 1 kit, Rfl: 0   Cholecalciferol (VITAMIN D3 GUMMIES) 25 MCG (1000 UT) CHEW, Chew 2 tablets (2,000 Units total) by mouth daily., Disp: 180 tablet, Rfl: 3   colestipol (COLESTID) 1 g tablet, Take 1-2  tablets (1-2 g total) by mouth 2 (two) times daily as needed., Disp: 360 tablet, Rfl: 3   diclofenac Sodium (VOLTAREN) 1 % GEL, Apply 2 g topically 4 (four) times daily. To painful areas of hands., Disp: 200 g, Rfl: 1   glucose blood (ACCU-CHEK GUIDE) test strip, Use to test blood glucose 3 times daily. Dx Z61.09UEA to test blood glucose 3 times daily.  Dx E11.65, Disp: 100 strip, Rfl: 10   insulin glargine (LANTUS SOLOSTAR) 100 UNIT/ML Solostar Pen, Inject 19 Units into the skin at bedtime. (Patient taking differently: Inject 12 Units into the skin at bedtime.), Disp: 15 mL, Rfl: 2   Insulin Pen Needle (BD PEN NEEDLE NANO U/F) 32G X 4 MM MISC, USE TO INJECT VICTOZA ONCE A DAY AND LANTUS ONCE A DAY AS DIRECTED, Disp: 100 each, Rfl: 5   metFORMIN (GLUCOPHAGE-XR) 500 MG 24 hr tablet, Take 1 tablet (500 mg total) by mouth in the morning and at bedtime., Disp: 180 tablet, Rfl: 3   nitroGLYCERIN (NITROSTAT) 0.4 MG SL tablet, PLACE 1 TABLET UNDER THE TONGUE EVERY 5 MINUTES AS NEEDED FOR CHEST PAIN, Disp: 25 tablet, Rfl: 5   nystatin cream (MYCOSTATIN), Apply 1 Application topically 2 (two) times daily., Disp: 30 g, Rfl: 3   pantoprazole (PROTONIX) 40 MG tablet, Take 1 tablet (40 mg total) by mouth daily., Disp: 90 tablet, Rfl: 3   PARoxetine (PAXIL) 40 MG tablet, Take 1 tablet (40 mg total) by mouth every morning., Disp: 90 tablet, Rfl: 3   pregabalin (LYRICA) 150 MG capsule, Take 1 capsule (150 mg total) by mouth every evening. TAKE 1 CAPSULE(150 MG) BY MOUTH DAILY, Disp: 90 capsule, Rfl: 3   rosuvastatin (CRESTOR) 40 MG tablet, Take 1 tablet (40 mg total) by mouth daily., Disp: 90 tablet, Rfl: 3   Semaglutide, 2 MG/DOSE, 8 MG/3ML SOPN, Inject 2 mg into the skin once a week. (Patient taking differently: Inject 2 mg into the skin once a week. (Ozempic)), Disp: 3 mL, Rfl: 11  Functional Status: Cognitively independent.  Her daughter has moved in to assist with supervision and home maintenance given [name] [name] pain-associated mobility limitations.  She is independent in ADLs though an extra hand here and there has been helpful.  Walks with cane, favoring chronic R hip pain. Chronic back pain has affected her for years.  Objective BP (!) 141/67 (BP Location: Right Arm, Patient Position: Sitting, Cuff Size: Small)   Pulse 81   Temp 98 F (36.7 C) (Oral)    Ht 5\' 2"  (1.575 m)   Wt 201 lb 1.6 oz (91.2 kg)   SpO2 100%   BMI 36.78 kg/m   Exam: Appears well, and weight loss is subjectively visible.  She is so pleased.  R carotid bruit, none on L.  Heart RRR, soft mid syst murmur at base.  Lungs clear, voice strong, no cough, no wet vocal quality, no respiratory distress, no JVD, no LE edema.  Neck with FROM, though she feels tightness in the bilat SCMs and in flexion and extenion mucles.  Walks slowly and cautiously with cane. Pushes up with arms and leans forward to arise from seated position.  Problems addressed today:  Neck pain: Recurrence of neck pain in all directions of ROM, accompanied by palpable tightness of her SCMs and posterior paracervical muscles. She isn't interested in surgery or surgical procedure. PT was recommended, she wishes to think about it. Topical muscle rubs are the safest intervention; I wouldn't prescribe muscle relaxants in  this situation given her risk for falls.   Diabetes mellitus type 2 with complications [facility]) Assessment & Plan: Fasting CBGs in low 100s.  Taking 19  units nightly Lantus, Ozempic 2 mg weekly (has lost 20# in 1 year), and metformin 500 mg bid (no higher due to GI intolerance/diarrhea).  A1c today = 8.2, declining nicely over the year.  Anticipated additional benefit with ongoing max dose Ozempic. No changes at this time. Recheck 66M.  Diabetic polyneuropathy associated with type 2 diabetes mellitus (HCC) Assessment & Plan: Burning pain both feet continues, worse at night, prevents her from resting well.  No foot wounds or skin concerns.  Doesn't feel that Lyrica 150 mg nightly is helpful.  We will trial off to see if sxs worsen.   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 Jointly followed by nephrology with annual visits.   Bruit of right carotid artery Assessment & Plan: Audible in very localized area lower R neck; asymptomatic.  Given high risk for CVA, will  evaluate with carotid US. Continue risk factor management.  DM2 and HTN nearly at goal, cholesterol controlled.  Orders: -     US Carotid Bilateral; Future  Bile salt-induced diarrhea Assessment & Plan: Infrequent use of prn colestid.  Symptoms stable, monitor.  GI Dr. Adela [name] aware, he is pleased.  Orders: -     Colestipol HCl; Take 1-2 tablets (1-2 g total) by mouth 2 (two) times daily as needed.  Dispense: 360 tablet; Refill: 3  Hypertension associated with diabetes (HCC), overly controlled at last visit, nearly at goal today.   At last visit her amlodipine was reduced from 10 to 5; will continue at this time, f/u BMP for renal fxn today. -     amLODIPine Besylate; Take 1 tablet (5 mg total) by mouth daily.  Dispense: 90 tablet; Refill: 3 -     Atenolol; Take 1 tablet (100 mg total) by mouth daily.  Dispense: 90 tablet; Refill: 3  Hyperlipidemia associated with type 2 diabetes mellitus (HCC) -     Rosuvastatin Calcium; Take 1 tablet (40 mg total) by mouth daily.  Dispense: 90 tablet; Refill: 3  Other orders -     Glucose, capillary     Return in about 3 months (around 03/29/2024) for chronic condition monitoring, symptom re-check.

## 2023-12-28 NOTE — Assessment & Plan Note (Addendum)
 Burning pain both feet continues, worse at night, prevents her from resting well.  No foot wounds or skin concerns.  Doesn't feel that Lyrica 150 mg nightly is helpful.  We will trial off to see if sxs worsen.

## 2023-12-28 NOTE — Assessment & Plan Note (Signed)
 Infrequent use of prn colestid.  Symptoms stable, monitor.

## 2023-12-29 ENCOUNTER — Other Ambulatory Visit: Payer: Self-pay | Admitting: Internal Medicine

## 2023-12-29 DIAGNOSIS — M47812 Spondylosis without myelopathy or radiculopathy, cervical region: Secondary | ICD-10-CM

## 2023-12-29 LAB — BMP8+ANION GAP
Anion Gap: 13 mmol/L (ref 10.0–18.0)
BUN/Creatinine Ratio: 20 (ref 12–28)
BUN: 17 mg/dL (ref 8–27)
CO2: 24 mmol/L (ref 20–29)
Calcium: 9.2 mg/dL (ref 8.7–10.3)
Chloride: 104 mmol/L (ref 96–106)
Creatinine, Ser: 0.84 mg/dL (ref 0.57–1.00)
Glucose: 160 mg/dL — ABNORMAL HIGH (ref 70–99)
Potassium: 4 mmol/L (ref 3.5–5.2)
Sodium: 141 mmol/L (ref 134–144)
eGFR: 71 mL/min/{1.73_m2} (ref >59)

## 2023-12-29 NOTE — Assessment & Plan Note (Signed)
 Recurrence of neck pain in all directions of ROM, accompanied by palpable tightness of her SCMs and posterior paracervical muscles.  She isn't interested in surgery or surgical procedure.  PT was recommended, she wishes to think about it.  Topical muscle rubs are the safest intervention; I wouldn't prescribe muscle relaxants in this situation given her risk for falls.

## 2024-02-08 ENCOUNTER — Ambulatory Visit (INDEPENDENT_AMBULATORY_CARE_PROVIDER_SITE_OTHER): Payer: 59 | Admitting: Podiatry

## 2024-02-08 ENCOUNTER — Encounter: Payer: Self-pay | Admitting: Podiatry

## 2024-02-08 VITALS — Ht 62.0 in | Wt 201.0 lb

## 2024-02-08 DIAGNOSIS — B351 Tinea unguium: Secondary | ICD-10-CM | POA: Diagnosis not present

## 2024-02-08 DIAGNOSIS — M79674 Pain in right toe(s): Secondary | ICD-10-CM

## 2024-02-08 DIAGNOSIS — Q72899 Other reduction defects of unspecified lower limb: Secondary | ICD-10-CM

## 2024-02-08 DIAGNOSIS — E1142 Type 2 diabetes mellitus with diabetic polyneuropathy: Secondary | ICD-10-CM | POA: Diagnosis not present

## 2024-02-08 DIAGNOSIS — M79675 Pain in left toe(s): Secondary | ICD-10-CM | POA: Diagnosis not present

## 2024-02-08 DIAGNOSIS — E119 Type 2 diabetes mellitus without complications: Secondary | ICD-10-CM

## 2024-02-08 DIAGNOSIS — L84 Corns and callosities: Secondary | ICD-10-CM | POA: Diagnosis not present

## 2024-02-13 NOTE — Progress Notes (Signed)
 ANNUAL DIABETIC FOOT EXAM  Subjective: [name] [name] presents today for annual diabetic foot exam.  Patient confirms h/o diabetes.  Patient denies any h/o foot wounds.  Patient has been diagnosed with neuropathy.  Sherol Dixie, MD is patient's PCP. LOV 12/28/2023.  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 Candi Chafe annual f/u, most recent 09/2019   CAD, non-obstructive 08/26/2006   Cath 07/05:multiple areas of nonobstructive disease = medical & RF mgmt, well preserved global systolic function. Dr. Judy Null. Nuclear med stress test 01/2014 : Normal stress nuclear study. LV Ejection Fraction: 76%. LV Wall Motion: NL LV Function; NL Wall Motion.       Chronic neck and back pain 01/03/2013   Multifactorial. Non opioid requiring.   L2-5 fusion, with hardware at L2-3, stable per MRI 2010. Followed by neurosurgery (Dr. Rochelle Chu) Cervical MRI 2010 : Disc herniation at C6-7 L>R; possible compression/irritation C8 nerve root L>R.     CKD stage 2 due to type 2 diabetes mellitus (HCC) 12/11/2016   Class 2 severe obesity due to excess calories with serious comorbidity and body mass index (BMI) of 36.0 to 36.9 in adult [facility])    Dental infection 04/13/2023   DEPRESSION 08/26/2006   On paxil      Diabetes mellitus type 2 with complications (HCC) 08/26/2006   Diabetic peripheral neuropathy associated with type 2 diabetes mellitus (HCC)    Diabetic retinopathy associated with controlled type 2 diabetes mellitus (HCC) 09/2019   with  macular edema OS - Dr. Candi Chafe   DYSLIPIDEMIA 11/01/2006   Fall at home 03/03/2018   Gallstones    GERD 08/26/2006   Heartburn QHS.      Hx of blood transfusion without reported diagnosis    Hx of esophageal dysphagia requiring dilatation.  03/03/2018   hx of esophageal stenosis s/p dilatation    Hx of non anemic vitamin B12 deficiency 12/27/2009   Switched from IM to PO 2014. Had normalized but repeat level 148 05/2015.  Resume IM B 12 2016        Hx of small intestinal bacterial overgrowth 12/24/2015   Presumed / clinical dx 2016. Complete resolution with metronidazole  (insuance would not cover rifaximin ) Flare 2017. Did not respond to ABX. Referral to GI B12 and ferritin low supporting malabsorption.    HYPERTENSION 08/26/2006   Hypertensive heart disease with chronic diastolic congestive heart failure (HCC) 05/01/2020   Labial cyst vs lipoma (see gyn visit 01/2021) 06/07/2020   Asymptomatic, reassurance given. GYN referral requested by pt, visit 01/2021, dx lipoma, reassurance given.     Morbid obesity with BMI of 50.0-59.9, adult (HCC)    Personal history of colonic polyps 2020   Adenomatous; f/u 2023 [facility]   PVD (peripheral vascular disease) (HCC), resolved per f/u ABIs 2016 03/27/2014   2015 LE arterial Duplex - Possible mild inflow disease on the left. 0-49% bilateral SFA disease, without focal stenosis. Three vesel run-off, bilaterally. Medical tx.    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) 12/11/2016   Diabetic polyneuropathy associated with type 2 diabetes mellitus (HCC) 12/10/2016   Goals of care, counseling/discussion 01/18/2015   PAD (peripheral artery disease) (HCC) 03/27/2014   Hypertensive heart disease with chronic diastolic congestive heart failure (HCC) 12/21/2013   Healthcare maintenance 04/25/2013   Chronic neck and back pain 01/03/2013   Osteopenia 08/01/2008   Hyperlipidemia associated with type 2 diabetes mellitus (HCC) 11/01/2006   Depression, major, recurrent, in remission (HCC) (on therapy) 08/26/2006   Hypertension associated with diabetes (HCC) 08/26/2006   Diabetic retinopathy (HCC) 08/26/2006   Diabetes mellitus type 2 with complications (HCC) 08/26/2006   Coronary artery disease involving native coronary artery of native heart without angina pectoris 08/26/2006   Past Surgical History:  Procedure Laterality Date   CATARACT EXTRACTION, BILATERAL     CHOLECYSTECTOMY     pt denies 04/07/16   COLONOSCOPY     endometrial biospy     ESOPHAGEAL DILATION     LEFT HEART CATH     lumbar fusion surgery  10/08   L3-L5   POLYPECTOMY     posterior lumbar interfusion surgery  07/18/07   L2-L3   rotator cuff surgery Bilateral    Current Outpatient Medications on File Prior to Visit  Medication Sig Dispense Refill   Accu-Chek FastClix Lancets MISC USE THREE TIMES DAILY. DIAG CODE E11.51 INSULIN  DEPENDENT 102 each 11   amLODipine  (NORVASC ) 5 MG tablet Take 1 tablet (5 mg total) by mouth daily. 90 tablet 3   aspirin  81 MG tablet Take 1 tablet (81 mg total) by mouth daily. 90 tablet 3   atenolol  (TENORMIN ) 100 MG tablet Take 1 tablet (100  mg total) by mouth daily. 90 tablet 3   Blood Glucose Monitoring Suppl (ACCU-CHEK GUIDE) w/Device KIT 1 each by Does not apply route 3 (three) times daily. 1 kit 0   Cholecalciferol  (VITAMIN D3 GUMMIES) 25 MCG (1000 UT) CHEW Chew 2 tablets (2,000 Units total) by mouth daily. 180 tablet 3   colestipol  (COLESTID ) 1 g tablet Take 1-2 tablets (1-2 g total) by mouth 2 (two) times daily as needed. 360 tablet 3   diclofenac  Sodium (VOLTAREN ) 1 % GEL Apply 2 g topically 4 (four) times daily. To painful areas of hands. 200 g 1   glucose blood (ACCU-CHEK GUIDE) test strip Use to test blood glucose 3 times daily. Dx Z61.09UEA to test blood glucose 3 times daily. Dx E11.65 100 strip 10   insulin  glargine (LANTUS  SOLOSTAR) 100 UNIT/ML Solostar Pen Inject 19 Units into the skin at bedtime. (Patient taking differently: Inject 12 Units into the skin at bedtime.) 15 mL 2  Insulin  Pen Needle (BD PEN NEEDLE NANO U/F) 32G X 4 MM MISC USE TO INJECT VICTOZA  ONCE A DAY AND LANTUS  ONCE A DAY AS DIRECTED 100 each 5   metFORMIN  (GLUCOPHAGE -XR) 500 MG 24 hr tablet Take 1 tablet (500 mg total) by mouth in the morning and at bedtime. 180 tablet 3   nitroGLYCERIN  (NITROSTAT ) 0.4 MG SL tablet PLACE 1 TABLET UNDER THE TONGUE EVERY 5 MINUTES AS NEEDED FOR CHEST PAIN 25 tablet 5   nystatin  cream (MYCOSTATIN ) Apply 1 Application topically 2 (two) times daily. 30 g 3   pantoprazole  (PROTONIX ) 40 MG tablet Take 1 tablet (40 mg total) by mouth daily. 90 tablet 3   PARoxetine  (PAXIL ) 40 MG tablet Take 1 tablet (40 mg total) by mouth every morning. 90 tablet 3   pregabalin  (LYRICA ) 150 MG capsule Take 1 capsule (150 mg total) by mouth every evening. TAKE 1 CAPSULE(150 MG) BY MOUTH DAILY 90 capsule 3   rosuvastatin  (CRESTOR ) 40 MG tablet Take 1 tablet (40 mg total) by mouth daily. 90 tablet 3   Semaglutide , 2 MG/DOSE, 8 MG/3ML SOPN Inject 2 mg into the skin once a week. (Patient taking differently: Inject 2 mg into the skin once a week.  (Ozempic )) 3 mL 11   No current facility-administered medications on file prior to visit.    No Known Allergies Social History   Occupational History   Not on file  Tobacco Use   Smoking status: Never    Passive exposure: Yes   Smokeless tobacco: Never  Vaping Use   Vaping status: Never Used  Substance and Sexual Activity   Alcohol use: No    Alcohol/week: 0.0 standard drinks of alcohol   Drug use: No   Sexual activity: Not Currently   Family History  Problem Relation Age of Onset   Cirrhosis Mother    Diabetes Father    Hypertension Father    Hypertension Sister    Diabetes Sister    Hyperlipidemia Sister    Colon cancer Neg Hx    Throat cancer Neg Hx    Esophageal cancer Neg Hx    Rectal cancer Neg Hx    Stomach cancer Neg Hx    Colon polyps Neg Hx    Immunization History  Administered Date(s) Administered   Fluad Quad(high Dose 65+) 06/19/2021, 07/02/2022   Fluad Trivalent(High Dose 65+) 06/24/2023   Influenza Split 08/25/2011, 07/15/2012   Influenza Whole 08/28/2008, 07/25/2009, 08/15/2010   Influenza,inj,Quad PF,6+ Mos 07/25/2013, 07/12/2014, 07/04/2015, 06/17/2017, 08/04/2018, 08/08/2020   Influenza-Unspecified 08/05/2016   PFIZER(Purple Top)SARS-COV-2 Vaccination 10/31/2019, 11/21/2019   Pneumococcal Conjugate-13 01/17/2015   Pneumococcal Polysaccharide-23 02/06/2010, 01/03/2013   Td 08/15/2010   Tdap 08/08/2020   Zoster, Live 02/03/2013     Review of Systems: Negative except as noted in the HPI.   Objective: There were no vitals filed for this visit.  [name] [name] is a pleasant 78 y.o. female in NAD. AAO X 3.  Diabetic foot exam was performed with the following findings:   Vascular Examination: CFT less than 3 seconds. DP pulses palpable b/l. PT pulses nonpalpable b/l. Digital hair absent. Skin temperature gradient warm to warn b/l. No ischemia or gangrene. No cyanosis or clubbing noted b/l. No edema noted b/l LE.   Neurological  Examination: Sensation grossly intact b/l with 10 gram monofilament. Pt has subjective symptoms of neuropathy.  Dermatological Examination: No open wounds. No interdigital macerations.   Toenails 1-5 b/l thick, discolored, elongated with subungual debris and pain on  dorsal palpation.   Pedal skin is warm and supple b/l LE. Hyperkeratotic lesion(s) submet head 1 left foot.  No erythema, no edema, no drainage, no fluctuance.  Musculoskeletal Examination: Muscle strength 5/5 to all lower extremity muscle groups bilaterally. 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 Pes planus deformity noted bilateral LE.  Radiographs: None     Lab Results  Component Value Date   HGBA1C 8.2 (A) 12/28/2023   ADA Risk Categorization: Low Risk :  Patient has all of the following: Intact protective sensation No prior foot ulcer  No severe deformity Pedal pulses present  Assessment: 1. Pain due to onychomycosis of toenails of both feet   2. Callus   3. Brachymetatarsia of fourth metatarsal bone   4. Diabetic peripheral neuropathy associated with type 2 diabetes mellitus (HCC)   5. Encounter for diabetic foot exam (HCC)     Plan: Diabetic foot examination performed today. All patient's and/or POA's questions/concerns addressed on today's visit. Toenails 1-5 debrided in length and girth without incident. Callus(es) submet head 1 left foot pared with sharp debridement without incident. Continue daily foot inspections and monitor blood glucose per PCP/Endocrinologist's recommendations.Continue soft, supportive shoe gear daily. Report any pedal injuries to medical professional. Call office if there are any questions/concerns. -Patient/POA to call should there be question/concern in the interim. Return in about 3 months (around 05/09/2024).  Luella Sager, DPM      [place] LOCATION: [address] [place] [address]                    Office [phone]   [facility] LOCATION: [address] [place], [place] [address] Office [phone]

## 2024-02-29 ENCOUNTER — Other Ambulatory Visit: Payer: Self-pay

## 2024-02-29 DIAGNOSIS — I25118 Atherosclerotic heart disease of native coronary artery with other forms of angina pectoris: Secondary | ICD-10-CM

## 2024-02-29 MED ORDER — NITROGLYCERIN 0.4 MG SL SUBL: SUBLINGUAL_TABLET | SUBLINGUAL | 5 refills | Status: AC

## 2024-03-31 ENCOUNTER — Ambulatory Visit (HOSPITAL_COMMUNITY)
Admission: RE | Admit: 2024-03-31 | Discharge: 2024-03-31 | Disposition: A | Discharge status: Home / Self Care | Source: Ambulatory Visit | Attending: Internal Medicine | Admitting: Internal Medicine

## 2024-03-31 DIAGNOSIS — M4802 Spinal stenosis, cervical region: Secondary | ICD-10-CM | POA: Diagnosis not present

## 2024-03-31 DIAGNOSIS — M542 Cervicalgia: Secondary | ICD-10-CM | POA: Diagnosis not present

## 2024-03-31 DIAGNOSIS — M47812 Spondylosis without myelopathy or radiculopathy, cervical region: Secondary | ICD-10-CM | POA: Insufficient documentation

## 2024-04-13 ENCOUNTER — Other Ambulatory Visit: Payer: Self-pay | Admitting: Internal Medicine

## 2024-04-13 DIAGNOSIS — R0989 Other specified symptoms and signs involving the circulatory and respiratory systems: Secondary | ICD-10-CM

## 2024-04-17 ENCOUNTER — Other Ambulatory Visit (HOSPITAL_COMMUNITY)

## 2024-04-25 ENCOUNTER — Ambulatory Visit (HOSPITAL_COMMUNITY)
Admission: RE | Admit: 2024-04-25 | Discharge: 2024-04-25 | Disposition: A | Discharge status: Home / Self Care | Source: Ambulatory Visit | Attending: Internal Medicine | Admitting: Internal Medicine

## 2024-04-25 DIAGNOSIS — R0989 Other specified symptoms and signs involving the circulatory and respiratory systems: Secondary | ICD-10-CM | POA: Insufficient documentation

## 2024-05-16 ENCOUNTER — Ambulatory Visit: Payer: Self-pay | Admitting: Student

## 2024-05-16 ENCOUNTER — Ambulatory Visit (INDEPENDENT_AMBULATORY_CARE_PROVIDER_SITE_OTHER): Admitting: Podiatry

## 2024-05-16 ENCOUNTER — Telehealth: Payer: Self-pay | Admitting: Internal Medicine

## 2024-05-16 DIAGNOSIS — Z91198 Patient's noncompliance with other medical treatment and regimen for other reason: Secondary | ICD-10-CM

## 2024-05-16 NOTE — Telephone Encounter (Signed)
 Patient was contacted via telephone this afternoon due to no showing her appointment this morning 05/16/2024 at 10:45 am with Dr. Norman Lobstein.  Was able to speak with the patient and she thought her appointment was tomorrow not today.  Appointment has been rescheduled for tomorrow 05/17/24 at 9:45 am with Dr. Norman Lobstein.

## 2024-05-17 ENCOUNTER — Ambulatory Visit: Admitting: Podiatry

## 2024-05-17 ENCOUNTER — Ambulatory Visit: Admitting: Student

## 2024-05-17 VITALS — BP 127/66 | HR 87 | Temp 98.2°F | Ht 62.0 in | Wt 198.6 lb

## 2024-05-17 DIAGNOSIS — I351 Nonrheumatic aortic (valve) insufficiency: Secondary | ICD-10-CM

## 2024-05-17 DIAGNOSIS — I152 Hypertension secondary to endocrine disorders: Secondary | ICD-10-CM

## 2024-05-17 DIAGNOSIS — Z794 Long term (current) use of insulin: Secondary | ICD-10-CM | POA: Diagnosis not present

## 2024-05-17 DIAGNOSIS — E1159 Type 2 diabetes mellitus with other circulatory complications: Secondary | ICD-10-CM | POA: Diagnosis not present

## 2024-05-17 DIAGNOSIS — E1169 Type 2 diabetes mellitus with other specified complication: Secondary | ICD-10-CM

## 2024-05-17 DIAGNOSIS — E118 Type 2 diabetes mellitus with unspecified complications: Secondary | ICD-10-CM | POA: Diagnosis not present

## 2024-05-17 DIAGNOSIS — R0989 Other specified symptoms and signs involving the circulatory and respiratory systems: Secondary | ICD-10-CM

## 2024-05-17 DIAGNOSIS — I110 Hypertensive heart disease with heart failure: Secondary | ICD-10-CM | POA: Diagnosis not present

## 2024-05-17 DIAGNOSIS — E785 Hyperlipidemia, unspecified: Secondary | ICD-10-CM | POA: Diagnosis not present

## 2024-05-17 DIAGNOSIS — Z7984 Long term (current) use of oral hypoglycemic drugs: Secondary | ICD-10-CM | POA: Diagnosis not present

## 2024-05-17 DIAGNOSIS — Z7985 Long-term (current) use of injectable non-insulin antidiabetic drugs: Secondary | ICD-10-CM | POA: Diagnosis not present

## 2024-05-17 LAB — POCT GLYCOSYLATED HEMOGLOBIN (HGB A1C): Hemoglobin A1C: 7.5 % — AB (ref 4.0–5.6)

## 2024-05-17 LAB — GLUCOSE, CAPILLARY: Glucose-Capillary: 136 mg/dL — ABNORMAL HIGH (ref 70–99)

## 2024-05-17 MED ORDER — METFORMIN HCL ER 750 MG PO TB24: 750.0000 mg | ORAL_TABLET | Freq: Two times a day (BID) | ORAL | 3 refills | Status: DC

## 2024-05-17 MED ORDER — ROSUVASTATIN CALCIUM 40 MG PO TABS: 40.0000 mg | ORAL_TABLET | Freq: Every day | ORAL | 3 refills | Status: AC

## 2024-05-17 NOTE — Progress Notes (Signed)
 1. Failure to attend appointment with reason given    Appt canceled by clinic. Power outage in office.

## 2024-05-17 NOTE — Assessment & Plan Note (Signed)
 Mild AR on 2022 TTE. She does have what appears to be Corrigan's pulse on exam today. I have re-ordered a TTE to further update/clarify cardiac function. Denies SOB, lightheadedness, chest pain. No signs of hypervolemia.

## 2024-05-17 NOTE — Assessment & Plan Note (Addendum)
 A1c continues to improve and is 7.5 today.  Managed with metformin -XR 500 mg twice daily, Ozempic  2 mg weekly, and Lantus  12 units nightly.  And her A1c continues to improve, my hope is to eliminate insulin  altogether in the future.  I will increase metformin -XR to 750 mg twice daily to assess tolerability.  She checks her blood sugar 3 times daily without significant highs or lows.  Recheck A1c in 3 months.

## 2024-05-17 NOTE — Assessment & Plan Note (Signed)
 Noted during last OV. US  Carotid then ordered which showed right ICA with a 40-59% stenosis possibly due to tortuosity (with < 50% plaque burden) and left ICA with a 1-39% stenosis and hemodynamically significant plaque >50% visualized in the CCA. Recommendation was for follow-up US  Carotid in 12 months. She is asymptomatic.

## 2024-05-17 NOTE — Progress Notes (Signed)
 CC: Follow-up  HPI:  [name] is a 78 y.o. female living with a history stated below and presents today for follow-up. Please see problem based assessment and plan for additional details.  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 Octavia annual f/u, most recent 09/2019   CAD, non-obstructive 08/26/2006   Cath 07/05:multiple areas of nonobstructive disease = medical & RF mgmt, well preserved global systolic function. Dr. Morris. Nuclear med stress test 01/2014 : Normal stress nuclear study. LV Ejection Fraction: 76%. LV Wall Motion: NL LV Function; NL Wall Motion.       Chronic neck and back pain 01/03/2013   Multifactorial. Non opioid requiring.   L2-5 fusion, with hardware at L2-3, stable per MRI 2010. Followed by neurosurgery (Dr. Joshua) Cervical MRI 2010 : Disc herniation at C6-7 L>R; possible compression/irritation C8 nerve root L>R.     CKD stage 2 due to type 2 diabetes mellitus (HCC) 12/11/2016   Class 2 severe obesity due to excess calories with serious comorbidity and body mass index (BMI) of 36.0 to 36.9 in adult [facility])    Dental infection 04/13/2023   DEPRESSION 08/26/2006   On paxil      Diabetes mellitus type 2 with complications (HCC) 08/26/2006   Diabetic peripheral neuropathy associated with type 2 diabetes mellitus (HCC)    Diabetic retinopathy associated with controlled type 2 diabetes mellitus (HCC) 09/2019   with  macular edema OS - Dr. Octavia   DYSLIPIDEMIA 11/01/2006   Fall at home 03/03/2018   Gallstones    GERD 08/26/2006   Heartburn QHS.      Hx of blood transfusion without reported diagnosis    Hx of esophageal dysphagia requiring dilatation. 03/03/2018   hx of esophageal stenosis s/p dilatation    Hx of non anemic vitamin B12  deficiency 12/27/2009   Switched from IM to PO 2014. Had normalized but repeat level 148 05/2015.  Resume IM B 12 2016        Hx of small intestinal bacterial overgrowth 12/24/2015   Presumed / clinical dx 2016. Complete resolution with metronidazole  (insuance would not cover rifaximin ) Flare 2017. Did not respond to ABX. Referral to GI B12 and ferritin low supporting malabsorption.    HYPERTENSION 08/26/2006   Hypertensive heart disease with chronic diastolic congestive heart failure (HCC) 05/01/2020   Labial cyst vs lipoma (see gyn visit 01/2021) 06/07/2020   Asymptomatic, reassurance given. GYN referral requested by pt, visit 01/2021, dx lipoma, reassurance given.     Morbid obesity with BMI of 50.0-59.9, adult (HCC)    Personal history of colonic polyps 2020   Adenomatous; f/u 2023 [facility]   PVD (peripheral vascular disease) (HCC), resolved per f/u ABIs 2016 03/27/2014   2015 LE arterial Duplex - Possible mild inflow disease on the left. 0-49% bilateral SFA disease, without focal stenosis. Three vesel run-off, bilaterally. Medical tx.    S/P bilateral cataract extraction    Type 2 diabetes mellitus with complications (HCC) 08/26/2006   Vitamin D  deficiency 12/27/2009             Current Outpatient Medications on File Prior to Visit  Medication Sig Dispense Refill   Accu-Chek FastClix Lancets MISC USE THREE TIMES DAILY. DIAG CODE E11.51 INSULIN  DEPENDENT 102  each 11   amLODipine  (NORVASC ) 5 MG tablet Take 1 tablet (5 mg total) by mouth daily. 90 tablet 3   aspirin  81 MG tablet Take 1 tablet (81 mg total) by mouth daily. 90 tablet 3   atenolol  (TENORMIN ) 100 MG tablet Take 1 tablet (100 mg total) by mouth daily. 90 tablet 3   Blood Glucose Monitoring Suppl (ACCU-CHEK GUIDE) w/Device KIT 1 each by Does not apply route 3 (three) times daily. 1 kit 0   Cholecalciferol  (VITAMIN D3 GUMMIES) 25 MCG (1000 UT) CHEW Chew 2 tablets (2,000 Units total) by mouth daily. 180 tablet 3   colestipol   (COLESTID ) 1 g tablet Take 1-2 tablets (1-2 g total) by mouth 2 (two) times daily as needed. 360 tablet 3   diclofenac  Sodium (VOLTAREN ) 1 % GEL Apply 2 g topically 4 (four) times daily. To painful areas of hands. 200 g 1   glucose blood (ACCU-CHEK GUIDE) test strip Use to test blood glucose 3 times daily. Dx Z88.34Ldz to test blood glucose 3 times daily. Dx E11.65 100 strip 10   insulin  glargine (LANTUS  SOLOSTAR) 100 UNIT/ML Solostar Pen Inject 19 Units into the skin at bedtime. (Patient taking differently: Inject 12 Units into the skin at bedtime.) 15 mL 2   Insulin  Pen Needle (BD PEN NEEDLE NANO U/F) 32G X 4 MM MISC USE TO INJECT VICTOZA  ONCE A DAY AND LANTUS  ONCE A DAY AS DIRECTED 100 each 5   nitroGLYCERIN  (NITROSTAT ) 0.4 MG SL tablet PLACE 1 TABLET UNDER THE TONGUE EVERY 5 MINUTES AS NEEDED FOR CHEST PAIN 25 tablet 5   nystatin  cream (MYCOSTATIN ) Apply 1 Application topically 2 (two) times daily. 30 g 3   pantoprazole  (PROTONIX ) 40 MG tablet Take 1 tablet (40 mg total) by mouth daily. 90 tablet 3   PARoxetine  (PAXIL ) 40 MG tablet Take 1 tablet (40 mg total) by mouth every morning. 90 tablet 3   pregabalin  (LYRICA ) 150 MG capsule Take 1 capsule (150 mg total) by mouth every evening. TAKE 1 CAPSULE(150 MG) BY MOUTH DAILY 90 capsule 3   Semaglutide , 2 MG/DOSE, 8 MG/3ML SOPN Inject 2 mg into the skin once a week. (Patient taking differently: Inject 2 mg into the skin once a week. (Ozempic )) 3 mL 11   No current facility-administered medications on file prior to visit.    Family History  Problem Relation Age of Onset   Cirrhosis Mother    Diabetes Father    Hypertension Father    Hypertension Sister    Diabetes Sister    Hyperlipidemia Sister    Colon cancer Neg Hx    Throat cancer Neg Hx    Esophageal cancer Neg Hx    Rectal cancer Neg Hx    Stomach cancer Neg Hx    Colon polyps Neg Hx     Social History   Socioeconomic History   Marital status: Single    Spouse name: Not on file    Number of children: 2   Years of education: Not on file   Highest education level: Not on file  Occupational History   Not on file  Tobacco Use   Smoking status: Never    Passive exposure: Yes   Smokeless tobacco: Never  Vaping Use   Vaping status: Never Used  Substance and Sexual Activity   Alcohol use: No    Alcohol/week: 0.0 standard drinks of alcohol   Drug use: No   Sexual activity: Not Currently  Other Topics Concern   Not  on file  Social History Narrative   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 Has two daughters and several grand kids. Severe limitation in ambulation. Occ goes out to church but usually daughter gets groceries.    She is one of 7 kids and the oldest so helped raise the other 6.   Social Drivers of Corporate investment banker Strain: Low Risk  (05/17/2024)   Overall Financial Resource Strain (CARDIA)    Difficulty of Paying Living Expenses: Not hard at all  Food Insecurity: No Food Insecurity (05/17/2024)   Hunger Vital Sign    Worried About Running Out of Food in the Last Year: Never true    Ran Out of Food in the Last Year: Never true  Transportation Needs: No Transportation Needs (05/17/2024)   PRAPARE - Administrator, Civil Service (Medical): No    Lack of Transportation (Non-Medical): No  Physical Activity: Insufficiently Active (05/17/2024)   Exercise Vital Sign    Days of Exercise per Week: 3 days    Minutes of Exercise per Session: 30 min  Stress: No Stress Concern Present (05/17/2024)   [employer] of Occupational Health - Occupational Stress Questionnaire    Feeling of Stress: Not at all  Social Connections: Moderately Isolated (12/08/2022)   Social Connection and Isolation Panel    Frequency of Communication with Friends and Family: More than three times a week    Frequency of Social Gatherings with Friends and Family: More than three times a week    Attends Religious Services: 1 to 4 times per year    Active Member of  [employer] or Organizations: No    Attends Banker Meetings: Never    Marital Status: Never married  Intimate Partner Violence: Not At Risk (05/17/2024)   Humiliation, Afraid, Rape, and Kick questionnaire    Fear of Current or Ex-Partner: No    Emotionally Abused: No    Physically Abused: No    Sexually Abused: No    Review of Systems: ROS negative except for what is noted on the assessment and plan.  Vitals:   05/17/24 0913 05/17/24 0939  BP: (!) 166/65 127/66  Pulse: 87 87  Temp: 98.2 F (36.8 C)   TempSrc: Oral   SpO2: 98%   Weight: 198 lb 9.6 oz (90.1 kg)   Height: 5' 2 (1.575 m)     Physical Exam: Constitutional: well-appearing, sitting in chair, in no acute distress Cardiovascular: regular rate and rhythm, no m/r/g, right-sided carotid bruit, pulse visualized in neck consistent with Corrigan's pulse  Pulmonary/Chest: normal work of breathing on room air, lungs clear to auscultation bilaterally Skin: warm and dry Psych: normal mood and behavior  Assessment & Plan:     Patient discussed with Dr. CHARLENA Eastern  Hypertension associated with diabetes (HCC) BP is 166/65 and 127/66. Home readings in 120-130's. Denies HA/visual changes. Continue Amlodipine  5 mg daily and Atenolol  100 mg daily.  Diabetes mellitus type 2 with complications (HCC) A1c continues to improve and is 7.5 today.  Managed with metformin -XR 500 mg twice daily, Ozempic  2 mg weekly, and Lantus  12 units nightly.  And her A1c continues to improve, my hope is to eliminate insulin  altogether in the future.  I will increase metformin -XR to 750 mg twice daily to assess tolerability.  She checks her blood sugar 3 times daily without significant highs or lows.  Recheck A1c in 3 months.  Bruit of right carotid artery Noted during last OV. US   Carotid then ordered which showed right ICA with a 40-59% stenosis possibly due to tortuosity (with < 50% plaque burden) and left ICA with a 1-39% stenosis and  hemodynamically significant plaque >50% visualized in the CCA. Recommendation was for follow-up US  Carotid in 12 months. She is asymptomatic.  Hyperlipidemia associated with type 2 diabetes mellitus (HCC) Managed with Crestor  40 mg daily. It has been well controlled recently. Repeat lipid panel today with goal < 70.  Nonrheumatic aortic valve insufficiency Mild AR on 2022 TTE. She does have what appears to be Corrigan's pulse on exam today. I have re-ordered a TTE to further update/clarify cardiac function. Denies SOB, lightheadedness, chest pain. No signs of hypervolemia.    Norman Lobstein, D.O. [facility] Internal Medicine, PGY-2 Phone: [phone] Date 05/17/2024 Time 11:41 AM

## 2024-05-17 NOTE — Assessment & Plan Note (Signed)
 Managed with Crestor  40 mg daily. It has been well controlled recently. Repeat lipid panel today with goal < 70.

## 2024-05-17 NOTE — Assessment & Plan Note (Signed)
 BP is 166/65 and 127/66. Home readings in 120-130's. Denies HA/visual changes. Continue Amlodipine  5 mg daily and Atenolol  100 mg daily.

## 2024-05-18 ENCOUNTER — Ambulatory Visit: Payer: Self-pay | Admitting: Student

## 2024-05-18 LAB — LIPID PANEL
Chol/HDL Ratio: 2.5 ratio (ref 0.0–4.4)
Cholesterol, Total: 139 mg/dL (ref 100–199)
HDL: 55 mg/dL (ref >39)
LDL Chol Calc (NIH): 61 mg/dL (ref 0–99)
Triglycerides: 129 mg/dL (ref 0–149)
VLDL Cholesterol Cal: 23 mg/dL (ref 5–40)

## 2024-05-18 NOTE — Progress Notes (Signed)
 Internal Medicine Clinic Attending  Case discussed with the resident at the time of the visit.  We reviewed the resident's history and exam and pertinent patient test results.  I agree with the assessment, diagnosis, and plan of care documented in the resident's note.

## 2024-05-19 ENCOUNTER — Telehealth: Payer: Self-pay | Admitting: *Deleted

## 2024-05-19 NOTE — Telephone Encounter (Signed)
 Copied from CRM [id number]. Topic: General - Other >> May 18, 2024  2:04 PM Miquel SAILOR wrote: Reason for CRM: Patient returning office call. Called and transferred to  [facility]

## 2024-05-19 NOTE — Telephone Encounter (Signed)
 Done. Spoke with patient on the phone. Lipid panel looks good. Per Dr Marylu.

## 2024-05-22 ENCOUNTER — Encounter: Payer: Self-pay | Admitting: Podiatry

## 2024-05-22 ENCOUNTER — Ambulatory Visit (INDEPENDENT_AMBULATORY_CARE_PROVIDER_SITE_OTHER): Admitting: Podiatry

## 2024-05-22 DIAGNOSIS — M79675 Pain in left toe(s): Secondary | ICD-10-CM | POA: Diagnosis not present

## 2024-05-22 DIAGNOSIS — Q72899 Other reduction defects of unspecified lower limb: Secondary | ICD-10-CM

## 2024-05-22 DIAGNOSIS — M79674 Pain in right toe(s): Secondary | ICD-10-CM | POA: Diagnosis not present

## 2024-05-22 DIAGNOSIS — B351 Tinea unguium: Secondary | ICD-10-CM

## 2024-05-22 DIAGNOSIS — E1142 Type 2 diabetes mellitus with diabetic polyneuropathy: Secondary | ICD-10-CM

## 2024-05-22 NOTE — Progress Notes (Signed)

## 2024-06-27 ENCOUNTER — Ambulatory Visit (HOSPITAL_COMMUNITY)
Admission: RE | Admit: 2024-06-27 | Discharge: 2024-06-27 | Disposition: A | Discharge status: Home / Self Care | Source: Ambulatory Visit | Attending: Internal Medicine

## 2024-06-27 DIAGNOSIS — I351 Nonrheumatic aortic (valve) insufficiency: Secondary | ICD-10-CM | POA: Diagnosis not present

## 2024-06-27 DIAGNOSIS — I5032 Chronic diastolic (congestive) heart failure: Secondary | ICD-10-CM | POA: Diagnosis not present

## 2024-06-27 DIAGNOSIS — I110 Hypertensive heart disease with heart failure: Secondary | ICD-10-CM | POA: Insufficient documentation

## 2024-06-27 DIAGNOSIS — E119 Type 2 diabetes mellitus without complications: Secondary | ICD-10-CM | POA: Insufficient documentation

## 2024-06-27 DIAGNOSIS — I080 Rheumatic disorders of both mitral and aortic valves: Secondary | ICD-10-CM | POA: Diagnosis not present

## 2024-06-27 DIAGNOSIS — I2510 Atherosclerotic heart disease of native coronary artery without angina pectoris: Secondary | ICD-10-CM | POA: Diagnosis not present

## 2024-06-27 LAB — ECHOCARDIOGRAM COMPLETE
Area-P 1/2: 3.54 cm2
Calc EF: 61.8 %
S' Lateral: 2.3 cm
Single Plane A2C EF: 62.2 %
Single Plane A4C EF: 64 %

## 2024-06-27 NOTE — Progress Notes (Signed)
  Echocardiogram 2D Echocardiogram has been performed.  Norleen ORN [facility] 06/27/2024, 9:29 AM

## 2024-07-04 ENCOUNTER — Other Ambulatory Visit: Payer: Self-pay

## 2024-07-04 DIAGNOSIS — E119 Type 2 diabetes mellitus without complications: Secondary | ICD-10-CM

## 2024-07-05 MED ORDER — LANTUS SOLOSTAR 100 UNIT/ML ~~LOC~~ SOPN: 12.0000 [IU] | PEN_INJECTOR | Freq: Every day | SUBCUTANEOUS | 3 refills | Status: AC

## 2024-07-06 NOTE — Telephone Encounter (Signed)
 Pt has already been informed per message below.  Copied from CRM [id number]. Topic: General - Other >> Jul 03, 2024  1:06 PM Brittney F wrote: Reason for CRM:   Patient called in after missing a call from the office. Specialist looked into the matter and found there was no evidence of a telephone encounter.   The patient was informed a message would be sent on her behalf.   Callback Number: [phone]

## 2024-07-10 ENCOUNTER — Other Ambulatory Visit: Payer: Self-pay

## 2024-07-10 DIAGNOSIS — F325 Major depressive disorder, single episode, in full remission: Secondary | ICD-10-CM

## 2024-07-10 DIAGNOSIS — E118 Type 2 diabetes mellitus with unspecified complications: Secondary | ICD-10-CM

## 2024-07-10 MED ORDER — ACCU-CHEK GUIDE TEST VI STRP: ORAL_STRIP | 12 refills | Status: AC

## 2024-07-10 MED ORDER — PAROXETINE HCL 40 MG PO TABS: 40.0000 mg | ORAL_TABLET | Freq: Every morning | ORAL | 3 refills | Status: AC

## 2024-07-11 ENCOUNTER — Other Ambulatory Visit: Payer: Self-pay

## 2024-07-11 DIAGNOSIS — E119 Type 2 diabetes mellitus without complications: Secondary | ICD-10-CM

## 2024-07-12 MED ORDER — SEMAGLUTIDE (2 MG/DOSE) 8 MG/3ML ~~LOC~~ SOPN: 2.0000 mg | PEN_INJECTOR | SUBCUTANEOUS | 11 refills | Status: AC

## 2024-08-17 ENCOUNTER — Ambulatory Visit: Admitting: Internal Medicine

## 2024-08-17 VITALS — BP 132/83 | HR 81 | Temp 97.6°F | Ht 62.0 in | Wt 191.4 lb

## 2024-08-17 DIAGNOSIS — E1122 Type 2 diabetes mellitus with diabetic chronic kidney disease: Secondary | ICD-10-CM

## 2024-08-17 DIAGNOSIS — Z23 Encounter for immunization: Secondary | ICD-10-CM

## 2024-08-17 DIAGNOSIS — E118 Type 2 diabetes mellitus with unspecified complications: Secondary | ICD-10-CM

## 2024-08-17 LAB — POCT GLYCOSYLATED HEMOGLOBIN (HGB A1C): HbA1c, POC (controlled diabetic range): 7.7 % — AB (ref 0.0–7.0)

## 2024-08-17 LAB — GLUCOSE, CAPILLARY: Glucose-Capillary: 137 mg/dL — ABNORMAL HIGH (ref 70–99)

## 2024-08-17 NOTE — Progress Notes (Signed)
 78 y.o. [name] [name] is here for routine follow-up of chronic problems listed below.  Problems discussed today:  Pain, generalized back and major joints Can't sleep Diarrhea, active gastocolic reflex - multiple stools, dates back spring 2025  Wants to go to [facility] to do water  therapy  Needs new strips for diabetes covered by [facility]  Patient Active Problem List   Diagnosis Date Noted   Chronic joint pain 09/27/2024   Chronic right hip pain 06/28/2023   Nonrheumatic aortic valve insufficiency 05/19/2022   Arthritis of both hands 11/06/2021   Hoarseness 07/23/2021   Laryngopharyngeal reflux 07/23/2021   Skin lump of leg, bilateral 04/17/2021   Action tremor 04/17/2021   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 03/03/2018   Aortic atherosclerosis 06/17/2017   Bruit of right carotid artery 04/08/2017   CKD stage 3a due to type 2 diabetes mellitus (HCC) 12/11/2016   Diabetic polyneuropathy associated with type 2 diabetes mellitus (HCC) 12/10/2016   Goals of care, counseling/discussion 01/18/2015   PAD (peripheral artery disease) 03/27/2014   Hypertensive heart disease with chronic diastolic congestive heart failure (HCC) 12/21/2013   Healthcare maintenance 04/25/2013   Chronic neck and back pain 01/03/2013   Osteopenia 08/01/2008   Hyperlipidemia associated with type 2 diabetes mellitus (HCC) 11/01/2006   Depression, major, recurrent, in remission (HCC) (on therapy) 08/26/2006   Hypertension associated with diabetes (HCC) 08/26/2006   Diabetic retinopathy (HCC) 08/26/2006   Diabetes mellitus  type 2 with complications (HCC) 08/26/2006   Coronary artery disease involving native coronary artery of native heart without angina pectoris 08/26/2006   Current Outpatient Medications:    empagliflozin  (JARDIANCE ) 10 MG TABS tablet, Take 1 tablet (10 mg total) by mouth daily before breakfast., Disp: 90 tablet, Rfl: 3   metFORMIN  (GLUCOPHAGE -XR) 500 MG 24 hr tablet, Take 1 tablet (500 mg total) by mouth in the morning and at bedtime., Disp: 180 tablet, Rfl: 3   Accu-Chek FastClix Lancets MISC, USE THREE TIMES DAILY. DIAG CODE E11.51 INSULIN  DEPENDENT, Disp: 102 each, Rfl: 11   amLODipine  (NORVASC ) 5 MG tablet, Take 1 tablet (5 mg total) by mouth daily., Disp: 90 tablet, Rfl: 3   aspirin  81 MG tablet, Take 1 tablet (81 mg total) by mouth daily., Disp: 90 tablet, Rfl: 3   atenolol  (TENORMIN ) 100 MG tablet, Take 1 tablet (100 mg total) by mouth daily., Disp: 90 tablet, Rfl: 3   Blood Glucose Monitoring Suppl (ACCU-CHEK GUIDE) w/Device KIT, 1 each by Does not apply route 3 (three) times daily., Disp: 1 kit, Rfl: 0   Cholecalciferol  (VITAMIN D3 GUMMIES) 25 MCG (1000 UT) CHEW, Chew 2 tablets (2,000 Units total) by mouth daily., Disp: 180 tablet, Rfl: 3   colestipol  (COLESTID ) 1 g tablet, Take 1-2 tablets (1-2 g total) by mouth 2 (two) times daily as needed., Disp: 360 tablet, Rfl: 3   diclofenac  Sodium (VOLTAREN ) 1 % GEL, Apply 2 g topically 4 (four) times daily. To painful areas of hands., Disp: 200 g, Rfl: 1   glucose blood (ACCU-CHEK GUIDE TEST) test strip, Use to test blood glucose 3 times  daily. Dx E11.65, Disp: 100 each, Rfl: 12   glucose blood (ACCU-CHEK GUIDE) test strip, Use to test blood glucose 3 times daily. Dx Z88.34Ldz to test blood glucose 3 times daily. Dx E11.65, Disp: 100 strip, Rfl: 10   insulin  glargine (LANTUS  SOLOSTAR) 100 UNIT/ML Solostar Pen, Inject 12 Units into the skin at bedtime., Disp: 15 mL, Rfl: 3   Insulin  Pen Needle 32G X 4 MM MISC, Use to inject insulin  as directed,  Disp: 100 each, Rfl: 3   nitroGLYCERIN  (NITROSTAT ) 0.4 MG SL tablet, PLACE 1 TABLET UNDER THE TONGUE EVERY 5 MINUTES AS NEEDED FOR CHEST PAIN, Disp: 25 tablet, Rfl: 5   nystatin  cream (MYCOSTATIN ), Apply 1 Application topically 2 (two) times daily., Disp: 30 g, Rfl: 3   pantoprazole  (PROTONIX ) 40 MG tablet, Take 1 tablet (40 mg total) by mouth daily., Disp: 90 tablet, Rfl: 3   PARoxetine  (PAXIL ) 40 MG tablet, Take 1 tablet (40 mg total) by mouth every morning., Disp: 90 tablet, Rfl: 3   pregabalin  (LYRICA ) 150 MG capsule, Take 1 capsule (150 mg total) by mouth every evening. TAKE 1 CAPSULE(150 MG) BY MOUTH DAILY, Disp: 90 capsule, Rfl: 3   rosuvastatin  (CRESTOR ) 40 MG tablet, Take 1 tablet (40 mg total) by mouth daily., Disp: 90 tablet, Rfl: 3   Semaglutide , 2 MG/DOSE, 8 MG/76ML SOPN, Inject 2 mg into the skin once a week. (Ozempic ), Disp: 3 mL, Rfl: 11  Functional Status: Cognitively independent. Her daughter has moved in to assist with supervision and home maintenance given [name] [name] pain-associated mobility limitations. She is independent in ADLs though an extra hand here and there has been helpful. Walks with cane, favoring chronic R hip pain. Chronic back pain has affected her for years.   Objective BP 132/83 (BP Location: Left Arm, Patient Position: Sitting, Cuff Size: Normal)   Pulse 81   Temp 97.6 F (36.4 C) (Oral)   Ht 5' 2 (1.575 m)   Wt 191 lb 6.4 oz (86.8 kg)   SpO2 94%   BMI 35.01 kg/m   Exam:  More tired and frustrated (with chronic pain) than usual, though maintains her attempt to remain positive. Weight loss (intended) is subjectively visible.   R carotid bruit, none on L (chronic).  Heart RRR, soft mid syst murmur at base.  Lungs clear, voice strong, no cough, no wet vocal quality, no respiratory distress, no JVD, no LE edema.  Neck with FROM, though she feels tightness in the bilat SCMs and in flexion and extenion mucles.  Walks slowly, uncomfortably, and cautiously with  cane. Pushes up with arms and leans forward to arise from seated position. No warmth or swelling of UE/LE joints.   Assessment and Plan: Diabetes mellitus type 2 with complications (HCC) Assessment & Plan: A1c is 7.7 today, from 7.5 76M ago after metformin  XR increased from 500 to 750 bid. Not tolerating increase, worsened diarrhea.  Will  need to decrease back to 500 bid (if not daily, to minimize diarrhea).  Orders: -     POCT glycosylated hemoglobin (Hb A1C) -     Microalbumin / creatinine urine ratio -     Basic metabolic panel with GFR -     Empagliflozin ; Take 1 tablet (10 mg total) by mouth daily before breakfast.  Dispense: 90 tablet; Refill: 3 -     metFORMIN  HCl ER; Take 1 tablet (500 mg total) by mouth in the morning and at bedtime.  Dispense: 180 tablet; Refill: 3  Encounter for  immunization -     Flu vaccine HIGH DOSE PF(Fluzone Trivalent)  CKD stage 3a due to type 2 diabetes mellitus (HCC) Assessment & Plan: UACR 8, stable over the past year.  CR 0.97, eGFR 59.  Generally stable function.  GLP 1A for renal protection.  Was on empagliflozin  at one time.  I have not identified in the chart any reason why it would be contraindicated, and anticipate resuming to help with diabetes as well (as metformin  will be reduced 2/2 diarrhea). Orders: -     Basic metabolic panel with GFR -     Empagliflozin ; Take 1 tablet (10 mg total) by mouth daily before breakfast.  Dispense: 90 tablet; Refill: 3  Hypertensive heart disease with chronic diastolic congestive heart failure (HCC) Assessment & Plan: Clear lungs, compensated. No change chronic regimen.  Hypertension associated with diabetes [facility]) Assessment & Plan: 132/83 on current regimen after taking meds on morning of visit.   Class 2 severe obesity due to excess calories with serious comorbidity and body mass index (BMI) of 36.0 to 36.9 in adult Assessment & Plan: Has lost about 30# x 1 year with GLP1a. She is pleased.  Continue  semaglutide , currently 2 mg.  Bile salt-induced diarrhea Assessment & Plan: Diarrhea has increased with higher dose metformin ; will reduce dose and monitor.   Chronic joint pain Assessment & Plan: Multiple areas of arthritis. Pain is interfering with QOL. She will be seeking water  therapy. The timing of worsening seems to coincide with the empiric trial off pregabalin  (she stopped as she didn't think it was very effective). Will discuss with her resuming.   History of colonic polyps Assessment & Plan: Colonoscopy Spring 2025: has aged out of further screenings  Return in about 3 months (around 11/17/2024) for chronic condition monitoring.

## 2024-08-17 NOTE — Patient Instructions (Signed)
 Ms. Auxier,  I definitely support you getting some therapy at [facility]!  Let me know if you need a form completed for referral.  I'll get back to you about the diarrhea and whether we need to stop the metformin .  Otherwise everything looks good.  Take care and stay well!    Dr. Trudy

## 2024-08-18 ENCOUNTER — Other Ambulatory Visit: Payer: Self-pay | Admitting: Internal Medicine

## 2024-08-18 DIAGNOSIS — Z1231 Encounter for screening mammogram for malignant neoplasm of breast: Secondary | ICD-10-CM

## 2024-08-18 LAB — BASIC METABOLIC PANEL WITH GFR
BUN/Creatinine Ratio: 20 (ref 12–28)
BUN: 19 mg/dL (ref 8–27)
CO2: 21 mmol/L (ref 20–29)
Calcium: 9 mg/dL (ref 8.7–10.3)
Chloride: 103 mmol/L (ref 96–106)
Creatinine, Ser: 0.97 mg/dL (ref 0.57–1.00)
Glucose: 143 mg/dL — ABNORMAL HIGH (ref 70–99)
Potassium: 4.3 mmol/L (ref 3.5–5.2)
Sodium: 140 mmol/L (ref 134–144)
eGFR: 59 mL/min/1.73 — ABNORMAL LOW (ref >59)

## 2024-08-18 LAB — MICROALBUMIN / CREATININE URINE RATIO
Creatinine, Urine: 286.5 mg/dL
Microalb/Creat Ratio: 8 mg/g{creat} (ref 0–29)
Microalbumin, Urine: 22.5 ug/mL

## 2024-08-23 ENCOUNTER — Encounter: Payer: Self-pay | Admitting: Podiatry

## 2024-08-23 ENCOUNTER — Ambulatory Visit (INDEPENDENT_AMBULATORY_CARE_PROVIDER_SITE_OTHER): Admitting: Podiatry

## 2024-08-23 DIAGNOSIS — M79675 Pain in left toe(s): Secondary | ICD-10-CM

## 2024-08-23 DIAGNOSIS — E1142 Type 2 diabetes mellitus with diabetic polyneuropathy: Secondary | ICD-10-CM

## 2024-08-23 DIAGNOSIS — M79674 Pain in right toe(s): Secondary | ICD-10-CM | POA: Diagnosis not present

## 2024-08-23 DIAGNOSIS — Q72899 Other reduction defects of unspecified lower limb: Secondary | ICD-10-CM | POA: Diagnosis not present

## 2024-08-23 DIAGNOSIS — B351 Tinea unguium: Secondary | ICD-10-CM

## 2024-08-23 NOTE — Progress Notes (Signed)

## 2024-08-28 ENCOUNTER — Telehealth: Payer: Self-pay

## 2024-08-28 NOTE — Telephone Encounter (Signed)
 Pharmacy is requesting a rx refill for Droplet pen needles 32G X . Unable to attach rx.  [facility] [phone] - [place], [place] - [address]

## 2024-08-29 ENCOUNTER — Other Ambulatory Visit: Payer: Self-pay | Admitting: Internal Medicine

## 2024-08-29 DIAGNOSIS — E1140 Type 2 diabetes mellitus with diabetic neuropathy, unspecified: Secondary | ICD-10-CM

## 2024-08-29 DIAGNOSIS — E118 Type 2 diabetes mellitus with unspecified complications: Secondary | ICD-10-CM

## 2024-08-29 MED ORDER — INSULIN PEN NEEDLE 32G X 4 MM MISC: 3 refills | Status: AC

## 2024-08-29 NOTE — Telephone Encounter (Unsigned)
 Copied from CRM [id number]. Topic: Clinical - Medication Refill >> Aug 29, 2024  1:23 PM Miquel SAILOR wrote: Medication: Accu-Chek FastClix Lancets MISC Insulin  Pen Needle 32G X 4 MM MISC Semaglutide , 2 MG/DOSE, 8 MG/3ML SOPN    Has the patient contacted their pharmacy? Yes (Agent: If no, request that the patient contact the pharmacy for the refill. If patient does not wish to contact the pharmacy document the reason why and proceed with request.) (Agent: If yes, when and what did the pharmacy advise?)  This is the patient's preferred pharmacy:  [facility] [phone] - [place], [place] - [address] AT [facility] [address] [place] [address] Phone: [phone] Fax: [phone]    Is this the correct pharmacy for this prescription? Yes If no, delete pharmacy and type the correct one.   Has the prescription been filled recently? Yes  Is the patient out of the medication? No  Has the patient been seen for an appointment in the last year OR does the patient have an upcoming appointment? Yes  Can we respond through MyChart? Yes  Agent: Please be advised that Rx refills may take up to 3 business days. We ask that you follow-up with your pharmacy.

## 2024-08-29 NOTE — Telephone Encounter (Signed)
 Insulin  Pen Needle refilled today.    Semaglutide  refilled on 10/1, not due.

## 2024-08-30 MED ORDER — ACCU-CHEK FASTCLIX LANCETS MISC: 11 refills | Status: AC

## 2024-09-01 ENCOUNTER — Other Ambulatory Visit (HOSPITAL_COMMUNITY): Payer: Self-pay | Admitting: Physician Assistant

## 2024-09-01 DIAGNOSIS — R1310 Dysphagia, unspecified: Secondary | ICD-10-CM

## 2024-09-04 ENCOUNTER — Telehealth: Payer: Self-pay | Admitting: Physical Medicine and Rehabilitation

## 2024-09-04 NOTE — Telephone Encounter (Signed)
 Patient is wanting an appointment with Dr. Eldonna.

## 2024-09-04 NOTE — Telephone Encounter (Signed)
 LM for patient to call

## 2024-09-12 ENCOUNTER — Ambulatory Visit

## 2024-09-19 ENCOUNTER — Telehealth: Payer: Self-pay | Admitting: Pharmacy Technician

## 2024-09-19 NOTE — Progress Notes (Addendum)
   09/19/2024  Patient ID: [name] [name], female   DOB: Apr 02, 1946, 78 y.o.   MRN: [id number]  Pharmacy Quality Measure Review  This patient is appearing on a report for being at risk of failing the adherence measure for cholesterol (statin) medications this calendar year.   Medication: Rosuvastatin  Last fill date: 09/10/2024 for 90 day supply. Medication picked up on 09/13/24  Insurance report was not up to date. No action needed at this time.   [name] [name], CPhT [facility] Office: [phone] Email: [name]

## 2024-09-27 ENCOUNTER — Encounter: Payer: Self-pay | Admitting: Internal Medicine

## 2024-09-27 DIAGNOSIS — G8929 Other chronic pain: Secondary | ICD-10-CM | POA: Insufficient documentation

## 2024-09-27 MED ORDER — METFORMIN HCL ER 500 MG PO TB24: 500.0000 mg | ORAL_TABLET | Freq: Two times a day (BID) | ORAL | 3 refills | Status: AC

## 2024-09-27 MED ORDER — EMPAGLIFLOZIN 10 MG PO TABS: 10.0000 mg | ORAL_TABLET | Freq: Every day | ORAL | 3 refills | Status: AC

## 2024-09-27 NOTE — Assessment & Plan Note (Signed)
 UACR 8, stable over the past year.  CR 0.97, eGFR 59.  Generally stable function.  GLP 1A for renal protection.  Was on empagliflozin  at one time.  I have not identified in the chart any reason why it would be contraindicated, and anticipate resuming to help with diabetes as well (as metformin  will be reduced 2/2 diarrhea).

## 2024-09-27 NOTE — Assessment & Plan Note (Signed)
 Has lost about 30# x 1 year with GLP1a. She is pleased.  Continue semaglutide , currently 2 mg.

## 2024-09-27 NOTE — Assessment & Plan Note (Signed)
 Clear lungs, compensated. No change chronic regimen.

## 2024-09-27 NOTE — Assessment & Plan Note (Signed)
 Colonoscopy Spring 2025: has aged out of further screenings

## 2024-09-27 NOTE — Assessment & Plan Note (Signed)
 132/83 on current regimen after taking meds on morning of visit.

## 2024-09-27 NOTE — Assessment & Plan Note (Signed)
 Diarrhea has increased with higher dose metformin ; will reduce dose and monitor.

## 2024-09-27 NOTE — Assessment & Plan Note (Signed)
 Multiple areas of arthritis. Pain is interfering with QOL. She will be seeking water  therapy. The timing of worsening seems to coincide with the empiric trial off pregabalin  (she stopped as she didn't think it was very effective). Will discuss with her resuming.

## 2024-09-27 NOTE — Assessment & Plan Note (Signed)
 A1c is 7.7 today, from 7.5 76M ago after metformin  XR increased from 500 to 750 bid. Not tolerating increase, worsened diarrhea.  Will  need to decrease back to 500 bid (if not daily, to minimize diarrhea).

## 2024-10-19 ENCOUNTER — Ambulatory Visit
Admission: RE | Admit: 2024-10-19 | Discharge: 2024-10-19 | Disposition: A | Discharge status: Home / Self Care | Source: Ambulatory Visit | Attending: Internal Medicine | Admitting: Internal Medicine

## 2024-10-19 DIAGNOSIS — Z1231 Encounter for screening mammogram for malignant neoplasm of breast: Secondary | ICD-10-CM

## 2024-10-23 LAB — OPHTHALMOLOGY REPORT-SCANNED

## 2024-10-26 ENCOUNTER — Telehealth: Payer: Self-pay

## 2024-10-26 NOTE — Telephone Encounter (Signed)
 Patient's daughter stated that her mother has been waiting on a call to have imaging of her back and neck area.  Patient has not been called or scheduled.  Patient's daughter checking on the status of the order.  Please advise.  Yani Lal N. Tomie, LPN [facility] Annual Wellness Team Direct Dial: [phone]

## 2024-10-30 ENCOUNTER — Other Ambulatory Visit: Payer: Self-pay | Admitting: *Deleted

## 2024-10-30 DIAGNOSIS — I152 Hypertension secondary to endocrine disorders: Secondary | ICD-10-CM

## 2024-10-30 MED ORDER — ATENOLOL 100 MG PO TABS: 100.0000 mg | ORAL_TABLET | Freq: Every day | ORAL | 3 refills | Status: AC

## 2024-12-06 ENCOUNTER — Ambulatory Visit: Admitting: Podiatry

## 2025-01-04 ENCOUNTER — Ambulatory Visit: Payer: Self-pay | Admitting: Internal Medicine
